# Patient Record
Sex: Male | Born: 1985 | Race: Black or African American | Hispanic: No | Marital: Single | Smoking: Former smoker
Health system: Southern US, Community
[De-identification: ages and names within clinical notes are randomized; demographics above are authoritative.]

## PROBLEM LIST (undated history)

## (undated) DIAGNOSIS — N189 Chronic kidney disease, unspecified: Secondary | ICD-10-CM

## (undated) DIAGNOSIS — I10 Essential (primary) hypertension: Secondary | ICD-10-CM

## (undated) DIAGNOSIS — L988 Other specified disorders of the skin and subcutaneous tissue: Secondary | ICD-10-CM

## (undated) DIAGNOSIS — J189 Pneumonia, unspecified organism: Secondary | ICD-10-CM

## (undated) DIAGNOSIS — K219 Gastro-esophageal reflux disease without esophagitis: Secondary | ICD-10-CM

## (undated) DIAGNOSIS — I420 Dilated cardiomyopathy: Secondary | ICD-10-CM

## (undated) DIAGNOSIS — F32A Depression, unspecified: Secondary | ICD-10-CM

## (undated) DIAGNOSIS — K611 Rectal abscess: Principal | ICD-10-CM

## (undated) DIAGNOSIS — M869 Osteomyelitis, unspecified: Secondary | ICD-10-CM

## (undated) DIAGNOSIS — M1990 Unspecified osteoarthritis, unspecified site: Secondary | ICD-10-CM

## (undated) DIAGNOSIS — F419 Anxiety disorder, unspecified: Secondary | ICD-10-CM

## (undated) DIAGNOSIS — K5010 Crohn's disease of large intestine without complications: Secondary | ICD-10-CM

## (undated) DIAGNOSIS — G709 Myoneural disorder, unspecified: Secondary | ICD-10-CM

## (undated) HISTORY — DX: Pneumonia, unspecified organism: J18.9

## (undated) HISTORY — DX: Dilated cardiomyopathy: I42.0

## (undated) HISTORY — DX: Other specified disorders of the skin and subcutaneous tissue: L98.8

## (undated) HISTORY — DX: Myoneural disorder, unspecified: G70.9

## (undated) HISTORY — DX: Depression, unspecified: F32.A

## (undated) HISTORY — DX: Rectal abscess: K61.1

## (undated) HISTORY — DX: Chronic kidney disease, unspecified: N18.9

## (undated) HISTORY — DX: Osteomyelitis, unspecified: M86.9

## (undated) HISTORY — DX: Unspecified osteoarthritis, unspecified site: M19.90

## (undated) HISTORY — DX: Anxiety disorder, unspecified: F41.9

## (undated) HISTORY — DX: Crohn's disease of large intestine without complications: K50.10

## (undated) HISTORY — DX: Gastro-esophageal reflux disease without esophagitis: K21.9

---

## 2004-08-23 HISTORY — PX: MANDIBLE FRACTURE SURGERY: SHX706

## 2012-11-19 ENCOUNTER — Encounter (HOSPITAL_COMMUNITY): Payer: Self-pay | Admitting: Emergency Medicine

## 2012-11-19 ENCOUNTER — Observation Stay (HOSPITAL_COMMUNITY)
Admission: EM | Admit: 2012-11-19 | Discharge: 2012-11-21 | Disposition: A | Discharge status: Home / Self Care | Payer: Self-pay | Attending: Cardiovascular Disease | Admitting: Cardiovascular Disease

## 2012-11-19 ENCOUNTER — Emergency Department (HOSPITAL_COMMUNITY): Payer: Self-pay

## 2012-11-19 DIAGNOSIS — R42 Dizziness and giddiness: Secondary | ICD-10-CM | POA: Insufficient documentation

## 2012-11-19 DIAGNOSIS — Z79899 Other long term (current) drug therapy: Secondary | ICD-10-CM | POA: Insufficient documentation

## 2012-11-19 DIAGNOSIS — I10 Essential (primary) hypertension: Secondary | ICD-10-CM | POA: Insufficient documentation

## 2012-11-19 DIAGNOSIS — R9431 Abnormal electrocardiogram [ECG] [EKG]: Secondary | ICD-10-CM

## 2012-11-19 DIAGNOSIS — R079 Chest pain, unspecified: Principal | ICD-10-CM | POA: Insufficient documentation

## 2012-11-19 DIAGNOSIS — E785 Hyperlipidemia, unspecified: Secondary | ICD-10-CM | POA: Insufficient documentation

## 2012-11-19 DIAGNOSIS — R209 Unspecified disturbances of skin sensation: Secondary | ICD-10-CM | POA: Insufficient documentation

## 2012-11-19 DIAGNOSIS — R110 Nausea: Secondary | ICD-10-CM | POA: Insufficient documentation

## 2012-11-19 DIAGNOSIS — I428 Other cardiomyopathies: Secondary | ICD-10-CM | POA: Insufficient documentation

## 2012-11-19 DIAGNOSIS — R61 Generalized hyperhidrosis: Secondary | ICD-10-CM | POA: Insufficient documentation

## 2012-11-19 DIAGNOSIS — F17200 Nicotine dependence, unspecified, uncomplicated: Secondary | ICD-10-CM | POA: Insufficient documentation

## 2012-11-19 HISTORY — DX: Essential (primary) hypertension: I10

## 2012-11-19 LAB — CBC
HCT: 37.9 % — ABNORMAL LOW (ref 39.0–52.0)
Hemoglobin: 13.2 g/dL (ref 13.0–17.0)
MCH: 28.6 pg (ref 26.0–34.0)
MCHC: 34.8 g/dL (ref 30.0–36.0)
MCV: 82.2 fL (ref 78.0–100.0)
Platelets: 242 10*3/uL (ref 150–400)
RBC: 4.61 MIL/uL (ref 4.22–5.81)
RDW: 14.2 % (ref 11.5–15.5)
WBC: 9.2 10*3/uL (ref 4.0–10.5)

## 2012-11-19 LAB — BASIC METABOLIC PANEL WITH GFR
BUN: 9 mg/dL (ref 6–23)
CO2: 24 meq/L (ref 19–32)
Calcium: 8.9 mg/dL (ref 8.4–10.5)
Chloride: 105 meq/L (ref 96–112)
Creatinine, Ser: 0.94 mg/dL (ref 0.50–1.35)
GFR calc Af Amer: >90 mL/min
GFR calc non Af Amer: >90 mL/min
Glucose, Bld: 102 mg/dL — ABNORMAL HIGH (ref 70–99)
Potassium: 3 meq/L — ABNORMAL LOW (ref 3.5–5.1)
Sodium: 141 meq/L (ref 135–145)

## 2012-11-19 LAB — POCT I-STAT TROPONIN I: Troponin i, poc: 0 ng/mL (ref 0.00–0.08)

## 2012-11-19 IMAGING — CR DG CHEST 1V PORT
1 series · 1 of 1 positions shown · non-contrast
Comparison: None.

CLINICAL DATA: Chest pain

PORTABLE CHEST - 1 VIEW

[AP]
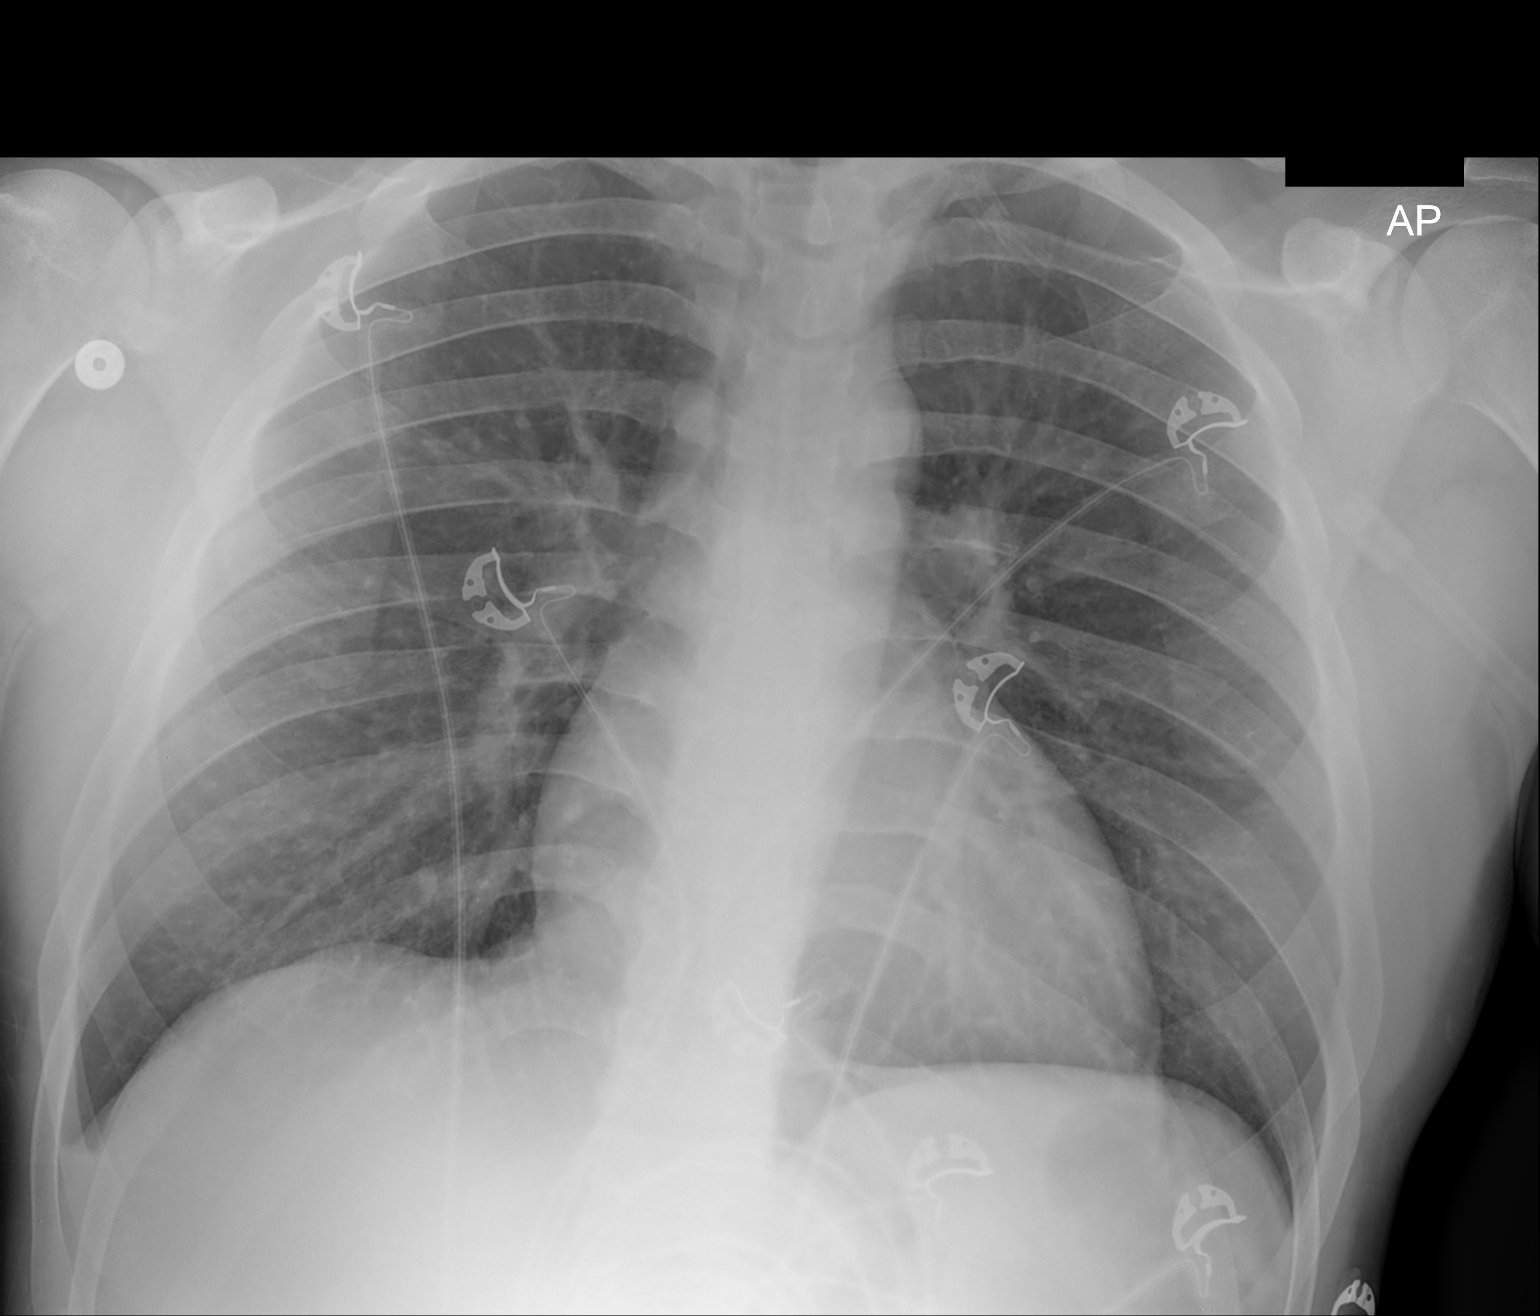

[1 of 1 positions shown; findings below may reference images not displayed]

FINDINGS: Normal heart size.  Clear lungs.  No pneumothorax.  No
acute bony deformity.  No definite pleural effusion.
IMPRESSION: No active cardiopulmonary disease.

## 2012-11-19 MED ORDER — ASPIRIN 81 MG PO CHEW
CHEWABLE_TABLET | ORAL | Status: AC
  Administered 2012-11-19: 324 mg via ORAL
  Filled 2012-11-19: qty 4

## 2012-11-19 MED ORDER — ASPIRIN 325 MG PO TABS: 325.0000 mg | ORAL_TABLET | ORAL | Status: DC

## 2012-11-19 MED ORDER — NITROGLYCERIN 0.4 MG SL SUBL
0.4000 mg | SUBLINGUAL_TABLET | SUBLINGUAL | Status: DC | PRN
  Administered 2012-11-19 (×2): 0.4 mg via SUBLINGUAL
  Filled 2012-11-19: qty 25

## 2012-11-19 MED ORDER — ASPIRIN 81 MG PO CHEW: 324.0000 mg | CHEWABLE_TABLET | Freq: Once | ORAL | Status: AC

## 2012-11-19 NOTE — ED Notes (Signed)
Pt c/o central chest pain non radiating onset 1900 tonight, describes at burning. +SHOB, +nausea +dizziness + diaphoresis. Pt also c/o L arm tingling/numbness.

## 2012-11-19 NOTE — ED Notes (Deleted)
Report called to [facility], pt to go [phone]

## 2012-11-20 ENCOUNTER — Observation Stay (HOSPITAL_COMMUNITY): Payer: Self-pay

## 2012-11-20 DIAGNOSIS — R079 Chest pain, unspecified: Secondary | ICD-10-CM | POA: Diagnosis present

## 2012-11-20 DIAGNOSIS — I10 Essential (primary) hypertension: Secondary | ICD-10-CM | POA: Diagnosis present

## 2012-11-20 LAB — BASIC METABOLIC PANEL
BUN: 12 mg/dL (ref 6–23)
CO2: 25 mEq/L (ref 19–32)
CO2: 24 mEq/L (ref 19–32)
Chloride: 106 mEq/L (ref 96–112)
Chloride: 105 mEq/L (ref 96–112)
Creatinine, Ser: 0.91 mg/dL (ref 0.50–1.35)
Creatinine, Ser: 1.02 mg/dL (ref 0.50–1.35)
GFR calc Af Amer: >90 mL/min (ref >90)
Glucose, Bld: 88 mg/dL (ref 70–99)
Glucose, Bld: 90 mg/dL (ref 70–99)
Potassium: 4 mEq/L (ref 3.5–5.1)

## 2012-11-20 LAB — CBC
HCT: 39.2 % (ref 39.0–52.0)
Hemoglobin: 13.6 g/dL (ref 13.0–17.0)
MCH: 28.9 pg (ref 26.0–34.0)
MCV: 83 fL (ref 78.0–100.0)
MCV: 83.2 fL (ref 78.0–100.0)
Platelets: 250 10*3/uL (ref 150–400)
Platelets: 263 10*3/uL (ref 150–400)
RBC: 4.4 MIL/uL (ref 4.22–5.81)
RBC: 4.71 MIL/uL (ref 4.22–5.81)
WBC: 8.9 10*3/uL (ref 4.0–10.5)
WBC: 8.6 10*3/uL (ref 4.0–10.5)

## 2012-11-20 LAB — PROTIME-INR: Prothrombin Time: 15.1 seconds (ref 11.6–15.2)

## 2012-11-20 LAB — LIPID PANEL
Cholesterol: 88 mg/dL (ref 0–200)
LDL Cholesterol: 54 mg/dL (ref 0–99)
Triglycerides: 31 mg/dL (ref <150)

## 2012-11-20 LAB — TROPONIN I
Troponin I: <0.3 ng/mL (ref <0.30)
Troponin I: <0.3 ng/mL (ref <0.30)
Troponin I: <0.3 ng/mL (ref <0.30)

## 2012-11-20 MED ORDER — ASPIRIN 81 MG PO CHEW
324.0000 mg | CHEWABLE_TABLET | ORAL | Status: AC
  Filled 2012-11-20: qty 4

## 2012-11-20 MED ORDER — NITROGLYCERIN IN D5W 200-5 MCG/ML-% IV SOLN
5.0000 ug/min | INTRAVENOUS | Status: DC
  Administered 2012-11-20: 5 ug/min via INTRAVENOUS
  Filled 2012-11-20: qty 250

## 2012-11-20 MED ORDER — SODIUM CHLORIDE 0.9 % IJ SOLN: 3.0000 mL | INTRAMUSCULAR | Status: DC | PRN

## 2012-11-20 MED ORDER — ONDANSETRON HCL 4 MG/2ML IJ SOLN: 4.0000 mg | Freq: Four times a day (QID) | INTRAMUSCULAR | Status: DC | PRN

## 2012-11-20 MED ORDER — ALPRAZOLAM 0.25 MG PO TABS
0.2500 mg | ORAL_TABLET | Freq: Two times a day (BID) | ORAL | Status: DC | PRN
  Administered 2012-11-20: 0.25 mg via ORAL
  Filled 2012-11-20: qty 1

## 2012-11-20 MED ORDER — SODIUM CHLORIDE 0.9 % IJ SOLN
3.0000 mL | Freq: Two times a day (BID) | INTRAMUSCULAR | Status: DC
  Administered 2012-11-20: 3 mL via INTRAVENOUS

## 2012-11-20 MED ORDER — POTASSIUM CHLORIDE CRYS ER 20 MEQ PO TBCR
40.0000 meq | EXTENDED_RELEASE_TABLET | Freq: Once | ORAL | Status: AC
  Administered 2012-11-20: 40 meq via ORAL
  Filled 2012-11-20: qty 2

## 2012-11-20 MED ORDER — REGADENOSON 0.4 MG/5ML IV SOLN
0.4000 mg | Freq: Once | INTRAVENOUS | Status: AC
  Administered 2012-11-20: 0.4 mg via INTRAVENOUS
  Filled 2012-11-20: qty 5

## 2012-11-20 MED ORDER — ASPIRIN 300 MG RE SUPP
300.0000 mg | RECTAL | Status: AC
  Filled 2012-11-20: qty 1

## 2012-11-20 MED ORDER — SODIUM CHLORIDE 0.9 % IV SOLN: 250.0000 mL | INTRAVENOUS | Status: DC | PRN

## 2012-11-20 MED ORDER — HEPARIN BOLUS VIA INFUSION
3000.0000 [IU] | Freq: Once | INTRAVENOUS | Status: AC
  Administered 2012-11-20: 3000 [IU] via INTRAVENOUS
  Filled 2012-11-20 (×2): qty 3000

## 2012-11-20 MED ORDER — NITROGLYCERIN 0.4 MG SL SUBL
0.4000 mg | SUBLINGUAL_TABLET | SUBLINGUAL | Status: DC | PRN
  Filled 2012-11-20: qty 25

## 2012-11-20 MED ORDER — HEPARIN BOLUS VIA INFUSION
4000.0000 [IU] | Freq: Once | INTRAVENOUS | Status: AC
  Administered 2012-11-20: 4000 [IU] via INTRAVENOUS
  Filled 2012-11-20: qty 4000

## 2012-11-20 MED ORDER — ACETAMINOPHEN 325 MG PO TABS: 650.0000 mg | ORAL_TABLET | ORAL | Status: DC | PRN

## 2012-11-20 MED ORDER — ATORVASTATIN CALCIUM 40 MG PO TABS
40.0000 mg | ORAL_TABLET | Freq: Every day | ORAL | Status: DC
  Administered 2012-11-20 – 2012-11-21 (×2): 40 mg via ORAL
  Filled 2012-11-20 (×2): qty 1

## 2012-11-20 MED ORDER — ASPIRIN EC 81 MG PO TBEC
81.0000 mg | DELAYED_RELEASE_TABLET | Freq: Every day | ORAL | Status: DC
  Filled 2012-11-20: qty 1

## 2012-11-20 MED ORDER — DIAZEPAM 5 MG PO TABS
5.0000 mg | ORAL_TABLET | ORAL | Status: AC
  Administered 2012-11-21: 5 mg via ORAL

## 2012-11-20 MED ORDER — HEPARIN (PORCINE) IN NACL 100-0.45 UNIT/ML-% IJ SOLN
1300.0000 [IU]/h | INTRAMUSCULAR | Status: DC
  Administered 2012-11-20: 1100 [IU]/h via INTRAVENOUS
  Administered 2012-11-20: 1300 [IU]/h via INTRAVENOUS
  Filled 2012-11-20 (×3): qty 250

## 2012-11-20 MED ORDER — METOPROLOL TARTRATE 25 MG PO TABS
25.0000 mg | ORAL_TABLET | Freq: Two times a day (BID) | ORAL | Status: DC
  Administered 2012-11-20 (×2): 25 mg via ORAL
  Filled 2012-11-20 (×5): qty 1

## 2012-11-20 MED ORDER — SODIUM CHLORIDE 0.9 % IV SOLN
INTRAVENOUS | Status: DC
  Administered 2012-11-20: 22:00:00 via INTRAVENOUS

## 2012-11-20 NOTE — ED Provider Notes (Signed)
History     CSN: [id number]  Arrival date & time 11/19/12  2243   First MD Initiated Contact with Patient 11/19/12 2328      Chief Complaint  Patient presents with  . Chest Pain    (Consider location/radiation/quality/duration/timing/severity/associated sxs/prior treatment) HPI 27 year old male presents to emergency room with complaint of chest pain, left arm pain and tingling starting at 7 PM.  Patient has shortness of breath, diaphoresis, and nausea associated with the symptoms.  Patient has past history of hypertension, is a pack-a-day smoker.  He reports grandfather with heart disease.  Patient ran out of his metoprolol approximately a month ago.  No prior history of same.  Patient is adamant that he has not used cocaine.  He received aspirin and 2 nitroglycerin prior to my evaluation.  He reports complete resolution of his symptoms.  Blood pressure still slightly elevated.  Past Medical History  Diagnosis Date  . Hypertension     History reviewed. No pertinent past surgical history.  No family history on file.  History  Substance Use Topics  . Smoking status: Current Every Day Smoker  . Smokeless tobacco: Not on file  . Alcohol Use: Yes     Comment: occ      Review of Systems  All other systems reviewed and are negative.    Allergies  Review of patient's allergies indicates no known allergies.  Home Medications  No current outpatient prescriptions on file.  BP 128/100  Pulse 69  Temp(Src) 98.7 F (37.1 C) (Oral)  Resp 16  Wt 205 lb (92.987 kg)  SpO2 99%  Physical Exam  Nursing note and vitals reviewed. Constitutional: He is oriented to person, place, and time. He appears well-developed and well-nourished.  HENT:  Head: Normocephalic and atraumatic.  Right Ear: External ear normal.  Left Ear: External ear normal.  Nose: Nose normal.  Mouth/Throat: Oropharynx is clear and moist.  Eyes: Conjunctivae and EOM are normal. Pupils are equal, round, and  reactive to light.  Neck: Normal range of motion. Neck supple. No JVD present. No tracheal deviation present. No thyromegaly present.  Cardiovascular: Normal rate, regular rhythm, normal heart sounds and intact distal pulses.  Exam reveals no gallop and no friction rub.   No murmur heard. Pulmonary/Chest: Effort normal and breath sounds normal. No stridor. No respiratory distress. He has no wheezes. He has no rales. He exhibits no tenderness.  Abdominal: Soft. Bowel sounds are normal. He exhibits no distension and no mass. There is no tenderness. There is no rebound and no guarding.  Musculoskeletal: Normal range of motion. He exhibits no edema and no tenderness.  Lymphadenopathy:    He has no cervical adenopathy.  Neurological: He is oriented to person, place, and time. He has normal reflexes. No cranial nerve deficit. He exhibits normal muscle tone. Coordination normal.  Skin: Skin is dry. No rash noted. No erythema. No pallor.  Psychiatric: He has a normal mood and affect. His behavior is normal. Judgment and thought content normal.    ED Course  Procedures (including critical care time)   Labs Reviewed  CBC - Abnormal; Notable for the following:    HCT 37.9 (*)    All other components within normal limits  BASIC METABOLIC PANEL - Abnormal; Notable for the following:    Potassium 3.0 (*)    Glucose, Bld 102 (*)    All other components within normal limits  PRO B NATRIURETIC PEPTIDE  POCT I-STAT TROPONIN I   Dg Chest [facility]  1 View  11/19/2012  *RADIOLOGY REPORT*  Clinical Data: Chest pain  PORTABLE CHEST - 1 VIEW  Comparison: None.  Findings: Normal heart size.  Clear lungs.  No pneumothorax.  No acute bony deformity.  No definite pleural effusion.  IMPRESSION: No active cardiopulmonary disease.   Original Report Authenticated By: Jolaine Click, M.D.     Date: 11/20/2012  Rate: 81  Rhythm: normal sinus rhythm  QRS Axis: normal  Intervals: normal  ST/T Wave abnormalities: t wave  inversions III, V5, V6  Conduction Disutrbances:none  Narrative Interpretation:   Old EKG Reviewed: none available    1. Chest pain   2. Hypertension   3. Abnormal EKG       MDM  27 year old male with significant hypertension, and chest pain, with diaphoresis, shortness of breath, and radiation into his left arm.  History is concerning for ACS.  He has inferior and lateral T-wave inversions, no prior history. will discuss with cardiology.  Initially ordered nitroglycerin drip, given that he had complete resolution sublingual ntg but since that time has had normalization of his blood pressure.        Olivia Mackie, MD 11/20/12 0600

## 2012-11-20 NOTE — Progress Notes (Signed)
Patient on nitro drip at 1.70ml/h for chest pain.  Patient to go to [facility] for stress test.  Patient has not had chest pain since 4am this morning; blood pressure and HR stable.  Nitro drip d/ced per Dr. Algie Coffer.  Patient is still on heparin drip at 11 ml/h.

## 2012-11-20 NOTE — Progress Notes (Signed)
Subjective:  No chest pain or reversible ischemia but dilated LV with mild to moderate LV systolic dysfunction and EF 44 %.  Objective:  Vital Signs in the last 24 hours: Temp:  [97.9 F (36.6 C)-98.7 F (37.1 C)] 98.6 F (37 C) (03/31 1535) Pulse Rate:  [54-88] 61 (03/31 1535) Cardiac Rhythm:  [-] Normal sinus rhythm (03/31 0809) Resp:  [12-20] 12 (03/31 1535) BP: (128-185)/(72-105) 154/96 mmHg (03/31 1535) SpO2:  [99 %-100 %] 100 % (03/31 1535) Weight:  [86 kg (189 lb 9.5 oz)-92.987 kg (205 lb)] 86 kg (189 lb 9.5 oz) (03/31 0337)  Physical Exam: BP Readings from Last 1 Encounters:  11/20/12 154/96     Wt Readings from Last 1 Encounters:  11/20/12 86 kg (189 lb 9.5 oz)    Weight change:   HEENT: [place]/AT, Eyes-Brown, PERL, EOMI, Conjunctiva-Pink, Sclera-Non-icteric Neck: No JVD, No bruit, Trachea midline. Lungs:  Clear, Bilateral. Cardiac:  Regular rhythm, normal S1 and S2, no S3. II/VI systolic murmur. Abdomen:  Soft, non-tender. Extremities:  No edema present. No cyanosis. No clubbing. CNS: AxOx3, Cranial nerves grossly intact, moves all 4 extremities. Right handed. Skin: Warm and dry.   Intake/Output from previous day: 03/30 0701 - 03/31 0700 In: 26.3 [I.V.:26.3] Out: 325 [Urine:325]    Lab Results: BMET    Component Value Date/Time   NA 142 11/20/2012 0439   K 3.8 11/20/2012 0439   CL 106 11/20/2012 0439   CO2 25 11/20/2012 0439   GLUCOSE 88 11/20/2012 0439   BUN 9 11/20/2012 0439   CREATININE 0.91 11/20/2012 0439   CALCIUM 9.1 11/20/2012 0439   GFRNONAA >90 11/20/2012 0439   GFRAA >90 11/20/2012 0439   CBC    Component Value Date/Time   WBC 8.9 11/20/2012 0439   RBC 4.40 11/20/2012 0439   HGB 12.5* 11/20/2012 0439   HCT 36.5* 11/20/2012 0439   PLT 250 11/20/2012 0439   MCV 83.0 11/20/2012 0439   MCH 28.4 11/20/2012 0439   MCHC 34.2 11/20/2012 0439   RDW 14.2 11/20/2012 0439   CARDIAC ENZYMES Lab Results  Component Value Date   TROPONINI <0.30 11/20/2012     Scheduled Meds: . aspirin  324 mg Oral NOW   Or  . aspirin  300 mg Rectal NOW  . [START ON 11/21/2012] aspirin EC  81 mg Oral Daily  . atorvastatin  40 mg Oral q1800  . metoprolol tartrate  25 mg Oral BID  . sodium chloride  3 mL Intravenous Q12H   Continuous Infusions: . heparin 1,100 Units/hr (11/20/12 0354)   PRN Meds:.sodium chloride, acetaminophen, ALPRAZolam, nitroGLYCERIN, ondansetron (ZOFRAN) IV, sodium chloride  Assessment/Plan: Dilated cardiomyopathy HTN  Right and left heart cath in AM.    LOS: 1 day    Dixie Dials  MD  11/20/2012, 6:56 PM

## 2012-11-20 NOTE — Progress Notes (Signed)
PHARMACY BRIEF NOTE - Drug Level Result  Consult for:  IV Heparin Indication:  ACS  With the infusion running at 1100 units/hr, the Heparin level at 18:00 is reported as 0.18 units/ml.  This level is below the therapeutic range, 0.3-0.7 units/ml.  Plan:  Increase the rate to 1300 units/hr with 3000 unit bolus.  Next level with am labs on 4/1.  Polo Riley R.Ph. 11/20/2012 8:26 PM

## 2012-11-20 NOTE — Progress Notes (Signed)
ANTICOAGULATION CONSULT NOTE - Initial Consult  Pharmacy Consult for Heparin Indication: chest pain/ACS  No Known Allergies  Patient Measurements: Height: 5\' 11"  (180.3 cm) Weight: 189 lb 9.5 oz (86 kg) IBW/kg (Calculated) : 75.3 Heparin Dosing Weight:   Vital Signs: Temp: 97.9 F (36.6 C) (03/31 0337) Temp src: Oral (03/31 0337) BP: 147/95 mmHg (03/31 0420) Pulse Rate: 60 (03/31 0420)  Labs:  Recent Labs  11/19/12 2327  HGB 13.2  HCT 37.9*  PLT 242  CREATININE 0.94    Estimated Creatinine Clearance: 124.6 ml/min (by C-G formula based on Cr of 0.94).   Medical History: Past Medical History  Diagnosis Date  . Hypertension     Medications:  No prescriptions prior to admission   Infusions:  . heparin 1,100 Units/hr (11/20/12 0354)  . nitroGLYCERIN 5 mcg/min (11/20/12 0334)    Assessment: Patient with chest pain.  Heparin ordered for ACS.  Goal of Therapy:  Heparin level 0.3-0.7 units/ml Monitor platelets by anticoagulation protocol: Yes   Plan:  Heparin drip at 1100 units/hr after 4000 units bolus.   Heparin level at 1000, Heparin level and CBC daily.   Darlina Guys, Jacquenette Shone Crowford 11/20/2012,4:52 AM

## 2012-11-20 NOTE — H&P (Signed)
Shawn [name] is an 27 y.o. male.   Chief Complaint: Chest pain HPI: 27 years old male with known hypertension and tobacco use disorder has central chest pain with nausea, dizziness and diaphoresis. + left arm tingling and numbness.   Past Medical History  Diagnosis Date  . Hypertension       History reviewed. No pertinent past surgical history.  No family history on file. Social History:  reports that he has been smoking.  He does not have any smokeless tobacco history on file. He reports that  drinks alcohol. He reports that he does not use illicit drugs.  Allergies: No Known Allergies   (Not in a hospital admission)  Results for orders placed during the hospital encounter of 11/19/12 (from the past 48 hour(s))  CBC     Status: Abnormal   Collection Time    11/19/12 11:27 PM      Result Value Range   WBC 9.2  4.0 - 10.5 K/uL   RBC 4.61  4.22 - 5.81 MIL/uL   Hemoglobin 13.2  13.0 - 17.0 g/dL   HCT 37.9 (*) 39.0 - 52.0 %   MCV 82.2  78.0 - 100.0 fL   MCH 28.6  26.0 - 34.0 pg   MCHC 34.8  30.0 - 36.0 g/dL   RDW 14.2  11.5 - 15.5 %   Platelets 242  150 - 400 K/uL  BASIC METABOLIC PANEL     Status: Abnormal   Collection Time    11/19/12 11:27 PM      Result Value Range   Sodium 141  135 - 145 mEq/L   Potassium 3.0 (*) 3.5 - 5.1 mEq/L   Chloride 105  96 - 112 mEq/L   CO2 24  19 - 32 mEq/L   Glucose, Bld 102 (*) 70 - 99 mg/dL   BUN 9  6 - 23 mg/dL   Creatinine, Ser 0.94  0.50 - 1.35 mg/dL   Calcium 8.9  8.4 - 10.5 mg/dL   GFR calc non Af Amer >90  >90 mL/min   GFR calc Af Amer >90  >90 mL/min   Comment:            The eGFR has been calculated     using the CKD EPI equation.     This calculation has not been     validated in all clinical     situations.     eGFR's persistently     <90 mL/min signify     possible Chronic Kidney Disease.  PRO B NATRIURETIC PEPTIDE     Status: None   Collection Time    11/19/12 11:27 PM      Result Value Range   Pro B Natriuretic  peptide (BNP) 21.2  0 - 125 pg/mL  POCT I-STAT TROPONIN I     Status: None   Collection Time    11/19/12 11:39 PM      Result Value Range   Troponin i, poc 0.00  0.00 - 0.08 ng/mL   Comment 3            Comment: Due to the release kinetics of cTnI,     a negative result within the first hours     of the onset of symptoms does not rule out     myocardial infarction with certainty.     If myocardial infarction is still suspected,     repeat the test at appropriate intervals.   Dg Chest [facility] 1 [address]  11/19/2012  *RADIOLOGY REPORT*  Clinical Data: Chest pain  PORTABLE CHEST - 1 VIEW  Comparison: None.  Findings: Normal heart size.  Clear lungs.  No pneumothorax.  No acute bony deformity.  No definite pleural effusion.  IMPRESSION: No active cardiopulmonary disease.   Original Report Authenticated By: Marybelle Killings, M.D.     @ROS @ No weight gain or loss.  No Fever-chills,  No Headache, No changes with Vision. No loss of hearing,  No problems swallowing food or Liquids,  As above Chest pain.  No Abdominal pain, No Nausea or Vommitting, Bowel movements are regular,  No Blood in stool or Urine,  No dysuria,  No new skin rashes or bruises,  No new joints pains-aches,  No new weakness, tingling, numbness in any extremity,  No recent weight gain or loss,  No polyuria, polydypsia or polyphagia,  No significant Mental Stressors.  Blood pressure 128/100, pulse 69, temperature 98.7 F (37.1 C), temperature source Oral, resp. rate 16, weight 92.987 kg (205 lb), SpO2 99.00%. HENT: Head: Normocephalic and atraumatic. Eyes: [place], Conjunctivae are normal. No scleral icterus.  Neck: supple. FROM  Cardiovascular: Normal rate, regular rhythm and normal heart sounds. Exam reveals no gallop and no friction rub. II/VI systolic murmur heard.  Pulmonary/Chest: Clear breath sounds, bilaterally.  Abdominal: Soft. Bowel sounds are normal. There is no tenderness.  Neurological: He is alert and oriented to  person, place, and time. Moves all 4 extremities.  Skin: Skin is warm and dry.     Assessment/Plan Chest pain R/O MI Hypertension  Nuclear stress test today.  Ruqayya Ventress S 11/20/2012, 2:27 AM

## 2012-11-20 NOTE — Progress Notes (Signed)
ANTICOAGULATION CONSULT NOTE - Follow Up  Pharmacy Consult for Heparin Indication: Chest Pain/ACS  No Known Allergies  Patient Measurements: Height: 5\' 11"  (180.3 cm) Weight: 189 lb 9.5 oz (86 kg) IBW/kg (Calculated) : 75.3  Vital Signs: Temp: 97.9 F (36.6 C) (03/31 0337) Temp src: Oral (03/31 0337) BP: 146/88 mmHg (03/31 0630) Pulse Rate: 55 (03/31 1000)  Labs:  Recent Labs  11/19/12 2327 11/20/12 0439 11/20/12 0935  HGB 13.2 12.5*  --   HCT 37.9* 36.5*  --   PLT 242 250  --   LABPROT  --  15.1  --   INR  --  1.21  --   HEPARINUNFRC  --   --  0.31  CREATININE 0.94 0.91  --   TROPONINI  --  <0.30 <0.30    Estimated Creatinine Clearance: 128.7 ml/min (by C-G formula based on Cr of 0.91).   Medications:  Scheduled:  . [COMPLETED] aspirin  324 mg Oral Once  . aspirin  324 mg Oral NOW   Or  . aspirin  300 mg Rectal NOW  . [START ON 11/21/2012] aspirin EC  81 mg Oral Daily  . atorvastatin  40 mg Oral q1800  . [COMPLETED] heparin  4,000 Units Intravenous Once  . metoprolol tartrate  25 mg Oral BID  . [COMPLETED] potassium chloride  40 mEq Oral Once  . [COMPLETED] potassium chloride  40 mEq Oral Once  . sodium chloride  3 mL Intravenous Q12H  . [DISCONTINUED] aspirin  325 mg Oral STAT   Infusions:  . heparin 1,100 Units/hr (11/20/12 0354)  . [DISCONTINUED] nitroGLYCERIN 5 mcg/min (11/20/12 0334)    Assessment: 27 yo M admitted to [facility] on 11/20/12 with chest pain and r/o MI. IV heparin was started at 4am today. 1st heparin level is therapeutic (0.31) but on low side of goal range. Patient has physically left the building (to [facility] for stress test), therefore plan will be to continue heparin at 1100 units/hr and recheck a heparin level in 6 hours (assuming IV heparin wasn't stopped). No bleeding reported in chart notes. Plts wnl.  Goal of Therapy:  Heparin level 0.3-0.7 units/ml Monitor platelets by anticoagulation protocol: Yes   Plan:  1) Continue  heparin at 1100 units/hr (11 ml/hr) 2) F/U results of stress test and if further anticoag needed 3) Will order repeat heparin level to confirm therapeutic dose.  Darrol Angel, PharmD Pager: [phone] 11/20/2012,11:19 AM

## 2012-11-20 NOTE — Care Management Note (Addendum)
    Page 1 of 1   11/21/2012     2:13:50 PM   CARE MANAGEMENT NOTE 11/21/2012  Patient:  Shawn [name], Shawn [name]   Account Number:  [id number]  Date Initiated:  11/20/2012  Documentation initiated by:  Colleen Can  Subjective/Objective Assessment:   DX CHEST PAIN     Action/Plan:   PT GOING TO [facility] FOR NUCLEAR STRES TEST   Anticipated DC Date:  11/21/2012   Anticipated DC Plan:  ACUTE TO ACUTE TRANS      DC Planning Services  CM consult      Choice offered to / List presented to:             Status of service:  Completed, signed off Medicare Important Message given?   (If response is "NO", the following Medicare IM given date fields will be blank) Date Medicare IM given:   Date Additional Medicare IM given:    Discharge Disposition:  ACUTE TO ACUTE TRANS  Per UR Regulation:  Reviewed for med. necessity/level of care/duration of stay  If discussed at Long Length of Stay Meetings, dates discussed:    Comments:  11/21/12 Niya Behler RN,BSN NCM [phone] TRANSFERRED TO [facility]-CARDIAC CATH VIA [facility].

## 2012-11-21 ENCOUNTER — Encounter (HOSPITAL_COMMUNITY)
Admission: EM | Disposition: A | Discharge status: Home / Self Care | Payer: Self-pay | Source: Home / Self Care | Attending: Emergency Medicine

## 2012-11-21 HISTORY — PX: LEFT AND RIGHT HEART CATHETERIZATION WITH CORONARY ANGIOGRAM: SHX5449

## 2012-11-21 LAB — POCT I-STAT 3, ART BLOOD GAS (G3+)
Acid-base deficit: 2 mmol/L (ref 0.0–2.0)
Bicarbonate: 23.9 mEq/L (ref 20.0–24.0)
pH, Arterial: 7.355 (ref 7.350–7.450)

## 2012-11-21 LAB — CBC
HCT: 37.4 % — ABNORMAL LOW (ref 39.0–52.0)
MCH: 28.2 pg (ref 26.0–34.0)
MCV: 82.9 fL (ref 78.0–100.0)
RBC: 4.51 MIL/uL (ref 4.22–5.81)
WBC: 8.3 10*3/uL (ref 4.0–10.5)

## 2012-11-21 LAB — POCT I-STAT 3, VENOUS BLOOD GAS (G3P V)
Acid-base deficit: 3 mmol/L — ABNORMAL HIGH (ref 0.0–2.0)
O2 Saturation: 66 %
TCO2: 24 mmol/L (ref 0–100)
pCO2, Ven: 43.7 mmHg — ABNORMAL LOW (ref 45.0–50.0)

## 2012-11-21 LAB — POCT ACTIVATED CLOTTING TIME: Activated Clotting Time: 149 seconds

## 2012-11-21 SURGERY — LEFT AND RIGHT HEART CATHETERIZATION WITH CORONARY ANGIOGRAM
Anesthesia: LOCAL

## 2012-11-21 SURGERY — LEFT AND RIGHT HEART CATHETERIZATION WITH CORONARY ANGIOGRAM
Anesthesia: Moderate Sedation

## 2012-11-21 MED ORDER — MIDAZOLAM HCL 2 MG/2ML IJ SOLN
INTRAMUSCULAR | Status: AC
  Filled 2012-11-21: qty 2

## 2012-11-21 MED ORDER — LIDOCAINE HCL (PF) 1 % IJ SOLN
INTRAMUSCULAR | Status: AC
  Filled 2012-11-21: qty 30

## 2012-11-21 MED ORDER — FENTANYL CITRATE 0.05 MG/ML IJ SOLN
INTRAMUSCULAR | Status: AC
  Filled 2012-11-21: qty 2

## 2012-11-21 MED ORDER — HYDRALAZINE HCL 20 MG/ML IJ SOLN
INTRAMUSCULAR | Status: AC
  Filled 2012-11-21: qty 1

## 2012-11-21 MED ORDER — LISINOPRIL 10 MG PO TABS: 10.0000 mg | ORAL_TABLET | Freq: Every day | ORAL | Status: DC

## 2012-11-21 MED ORDER — DIAZEPAM 5 MG PO TABS
ORAL_TABLET | ORAL | Status: AC
  Filled 2012-11-21: qty 1

## 2012-11-21 MED ORDER — HEPARIN (PORCINE) IN NACL 2-0.9 UNIT/ML-% IJ SOLN
INTRAMUSCULAR | Status: AC
  Filled 2012-11-21: qty 1000

## 2012-11-21 MED ORDER — HYDRALAZINE HCL 20 MG/ML IJ SOLN
10.0000 mg | Freq: Once | INTRAMUSCULAR | Status: AC
  Administered 2012-11-21: 10 mg via INTRAVENOUS

## 2012-11-21 MED ORDER — METOPROLOL TARTRATE 25 MG PO TABS: 25.0000 mg | ORAL_TABLET | Freq: Two times a day (BID) | ORAL | Status: DC

## 2012-11-21 MED ORDER — SODIUM CHLORIDE 0.9 % IV SOLN
INTRAVENOUS | Status: AC
  Administered 2012-11-21: 11:00:00 via INTRAVENOUS

## 2012-11-21 NOTE — Discharge Summary (Signed)
Physician Discharge Summary  Patient ID: Shawn [name] MRN: [id number] DOB/AGE: 1985-09-26 27 y.o.  Admit date: 11/19/2012 Discharge date: 11/21/2012  Admission Diagnoses: Chest pain at rest Hypertension  Discharge Diagnoses:  Principal Problem:   Chest pain at rest Active Problems:   Hypertension   Dilated cardiomyopathy    Tobacco use disorder   Dyslipidemia  Discharged Condition: good  Hospital Course: 27 years old male with known hypertension and tobacco use disorder had central chest pain with nausea, dizziness and diaphoresis along with left arm tingling and numbness. His nuclear stress test revealed dilated LV with moderate LV systolic dysfunction. He underwent right and left heart catheterization that showed hypertensive heart disease with mild LV systolic dysfunction and normal coronaries. Ace inhibitor and B-blocker medications were added andlow salt diet, activity to improve low HDL cholesterol and medication compliance were discussed. He was strongly advised to decrease smoking over next 1-2 weeks and go for smoking cessation class.   Consults: None  Significant Diagnostic Studies: labs: Normal blood test post K+ supplementation. Low HDL of 28.  nuclear medicine: Dilated LV with EF of 44 %.  Angiography: Normal right heart pressures, normal coronaries and mild systolic dysfunction with EF 50 %.  Treatments: cardiac meds: lisinopril (Prinivil) and metoprolol  Discharge Exam: Blood pressure 142/84, pulse 84, temperature 98.2 F (36.8 C), temperature source Oral, resp. rate 16, height 5' 11"  (1.803 m), weight 86 kg (189 lb 9.5 oz), SpO2 100.00%. HEENT: [place]/AT, Eyes-Brown, PERL, EOMI, Conjunctiva-Pink, Sclera-Non-icteric  Neck: No JVD, No bruit, Trachea midline.  Lungs: Clear, Bilateral.  Cardiac: Regular rhythm, normal S1 and S2, no S3. II/VI systolic murmur.  Abdomen: Soft, non-tender.  Extremities: No edema present. No cyanosis. No clubbing.  CNS: AxOx3, Cranial  nerves grossly intact, moves all 4 extremities. Right handed.  Skin: Warm and dry.   Disposition: 01, Home, self care.     Medication List    TAKE these medications       lisinopril 10 MG tablet  Commonly known as:  ZESTRIL  Take 1 tablet (10 mg total) by mouth daily.     metoprolol tartrate 25 MG tablet  Commonly known as:  LOPRESSOR  Take 1 tablet (25 mg total) by mouth 2 (two) times daily.           Follow-up Information   Follow up with [facility] S, MD. Schedule an appointment as soon as possible for a visit in 1 month.   Contact information:   [place] [id number] [phone]       Signed: Birdie Riddle 11/21/2012, 6:14 PM

## 2012-11-21 NOTE — CV Procedure (Signed)
PROCEDURE:  Left heart catheterization with selective coronary angiography, left ventriculogram.  CLINICAL HISTORY:  This is a 27 year old male with chest pain, hypertension, tobacco use disorder and LV systolic dysfunction.  The risks, benefits, and details of the procedure were explained to the patient.  The patient verbalized understanding and wanted to proceed.  Informed written consent was obtained.  PROCEDURE TECHNIQUE:  The patient was approached from the right femoral artery using a 5 French short sheath and right femoral vein using 7 Fr. sheath.  Left coronary angiography was done using a Judkins L4 guide catheter.  Right coronary angiography was done using a Judkins R4 guide catheter.  Left ventriculography was done using a pigtail catheter. Right heart catheterization and cardiac output were done using Swan Ganz catheter placed under fluoroscopic guidance. Oxygen saturations were measured on blood from PA and AO.   CONTRAST:  Total of 50 cc.  COMPLICATIONS:  None.  At the end of the procedure a manual device was used for hemostasis.    HEMODYNAMICS:  Aortic pressure was 132/92; LV pressure was 140/6; LVEDP 13.  There was no gradient between the left ventricle and aorta.  PA 24/10, PW-13,  RV-29/7, RA-13/12. CO-5.87-Thermal and 5.29 by Fick.  ANGIOGRAM/CORONARY ARTERIOGRAM:   The left main coronary artery is unremarkable.  The left anterior descending artery is unremarkable. Diagonal is unremarkable  The left circumflex artery is unremarkable with large Ramus branch.  The right coronary artery is dominant and unremarkable. PDA and postero-lateral branches are unremarkable.  LEFT VENTRICULOGRAM:  Left ventricular angiogram was done in the 30 RAO projection and revealed mild generalized hypokinesia of left ventricle with an estimated ejection fraction of 50%.  LVEDP was 13 mmHg.  IMPRESSION OF HEART CATHETERIZATION:   1. Normal left main coronary artery. 2. Normal left anterior  descending artery and its branches. 3. Normal left circumflex artery and its branches. 4. Normal right coronary artery. 5. Mild left ventricular systolic dysfunction.  LVEDP 13 mmHg.  Ejection fraction 50%. 6.  Normal right heart pressure.  RECOMMENDATION:   Life style modification. Hydralazine for elevated blood pressure was given in holding area post procedure.

## 2012-11-21 NOTE — Progress Notes (Signed)
ANTICOAGULATION CONSULT NOTE - Follow Up  Pharmacy Consult for Heparin Indication: Chest Pain/ACS  No Known Allergies  Patient Measurements: Height: 5\' 11"  (180.3 cm) Weight: 189 lb 9.5 oz (86 kg) IBW/kg (Calculated) : 75.3  Vital Signs: Temp: 98.2 F (36.8 C) (04/01 0506) Temp src: Oral (04/01 0506) BP: 133/87 mmHg (04/01 0506) Pulse Rate: 57 (04/01 0506)  Labs:  Recent Labs  11/19/12 2327 11/20/12 0439 11/20/12 0935 11/20/12 1525 11/20/12 1754 11/20/12 1942 11/21/12 0449  HGB 13.2 12.5*  --   --   --  13.6 12.7*  HCT 37.9* 36.5*  --   --   --  39.2 37.4*  PLT 242 250  --   --   --  263 245  LABPROT  --  15.1  --   --   --  14.2  --   INR  --  1.21  --   --   --  1.11  --   HEPARINUNFRC  --   --  0.31  --  0.18*  --  0.38  CREATININE 0.94 0.91  --   --   --  1.02  --   TROPONINI  --  <0.30 <0.30 <0.30  --   --   --     Estimated Creatinine Clearance: 114.8 ml/min (by C-G formula based on Cr of 1.02).   Medications:  Scheduled:  . [EXPIRED] aspirin  324 mg Oral NOW   Or  . [EXPIRED] aspirin  300 mg Rectal NOW  . aspirin EC  81 mg Oral Daily  . atorvastatin  40 mg Oral q1800  . diazepam  5 mg Oral On Call  . [COMPLETED] heparin  3,000 Units Intravenous Once  . metoprolol tartrate  25 mg Oral BID  . [COMPLETED] regadenoson  0.4 mg Intravenous Once  . sodium chloride  3 mL Intravenous Q12H  . sodium chloride  3 mL Intravenous Q12H   Infusions:  . sodium chloride 50 mL/hr at 11/20/12 2139  . heparin 1,300 Units/hr (11/20/12 2131)  . [DISCONTINUED] nitroGLYCERIN 5 mcg/min (11/20/12 0334)    Assessment:  27 yo M admitted to [facility] on 11/20/12 with chest pain and r/o MI. IV heparin was started at 4am today.   HL= 0.38 after 3000 unit bolus and drip to 1300 units/hr @ Css  No IV interuptions, small amt of blood in stool reported to RN by pt.  Pt going to cath lab this am.  Goal of Therapy:  Heparin level 0.3-0.7 units/ml Monitor platelets by  anticoagulation protocol: Yes   Plan:  1) Continue heparin at 1100 units/hr (11 ml/hr) 2) F/U s/p cath lab and if further anticoag needed 3) Will order repeat HL after pt back from cath if still on heparin   Susanne Greenhouse R 11/21/2012,5:49 AM

## 2012-11-21 NOTE — Progress Notes (Signed)
Subjective:  Here for right and left heart catheterization. No chest pain. Afebrile.  Objective:  Vital Signs in the last 24 hours: Temp:  [98.2 F (36.8 C)-98.6 F (37 C)] 98.2 F (36.8 C) (04/01 0506) Pulse Rate:  [55-72] 57 (04/01 0506) Cardiac Rhythm:  [-] Normal sinus rhythm (03/31 2130) Resp:  [12-16] 16 (04/01 0506) BP: (133-156)/(72-100) 133/87 mmHg (04/01 0506) SpO2:  [100 %] 100 % (04/01 0506)  Physical Exam: BP Readings from Last 1 Encounters:  11/21/12 133/87     Wt Readings from Last 1 Encounters:  11/20/12 86 kg (189 lb 9.5 oz)    Weight change:   HEENT: [place]/AT, Eyes-Brown, PERL, EOMI, Conjunctiva-Pink, Sclera-Non-icteric Neck: No JVD, No bruit, Trachea midline. Lungs:  Clear, Bilateral. Cardiac:  Regular rhythm, normal S1 and S2, no S3.  Abdomen:  Soft, non-tender. Extremities:  No edema present. No cyanosis. No clubbing. CNS: AxOx3, Cranial nerves grossly intact, moves all 4 extremities. Right handed. Skin: Warm and dry.   Intake/Output from previous day: 03/31 0701 - 04/01 0700 In: 1055.8 [P.O.:540; I.V.:515.8] Out: -     Lab Results: BMET    Component Value Date/Time   NA 140 11/20/2012 1942   K 4.0 11/20/2012 1942   CL 105 11/20/2012 1942   CO2 24 11/20/2012 1942   GLUCOSE 90 11/20/2012 1942   BUN 12 11/20/2012 1942   CREATININE 1.02 11/20/2012 1942   CALCIUM 9.2 11/20/2012 1942   GFRNONAA >90 11/20/2012 1942   GFRAA >90 11/20/2012 1942   CBC    Component Value Date/Time   WBC 8.3 11/21/2012 0449   RBC 4.51 11/21/2012 0449   HGB 12.7* 11/21/2012 0449   HCT 37.4* 11/21/2012 0449   PLT 245 11/21/2012 0449   MCV 82.9 11/21/2012 0449   MCH 28.2 11/21/2012 0449   MCHC 34.0 11/21/2012 0449   RDW 14.1 11/21/2012 0449   CARDIAC ENZYMES Lab Results  Component Value Date   TROPONINI <0.30 11/20/2012    Scheduled Meds: . aspirin EC  81 mg Oral Daily  . atorvastatin  40 mg Oral q1800  . diazepam  5 mg Oral On Call  . metoprolol tartrate  25 mg Oral BID  . sodium  chloride  3 mL Intravenous Q12H  . sodium chloride  3 mL Intravenous Q12H   Continuous Infusions: . sodium chloride 50 mL/hr at 11/20/12 2139  . heparin Stopped (11/21/12 0627)   PRN Meds:.sodium chloride, sodium chloride, acetaminophen, ALPRAZolam, nitroGLYCERIN, ondansetron (ZOFRAN) IV, sodium chloride, sodium chloride  Assessment/Plan: Dilated cardiomyopathy Hypertension  Right and left heart cath today.    LOS: 2 days    Dixie Dials  MD  11/21/2012, 7:13 AM

## 2013-05-16 ENCOUNTER — Emergency Department (HOSPITAL_COMMUNITY)
Admission: EM | Admit: 2013-05-16 | Discharge: 2013-05-16 | Disposition: A | Discharge status: Home / Self Care | Payer: Self-pay | Attending: Emergency Medicine | Admitting: Emergency Medicine

## 2013-05-16 ENCOUNTER — Encounter (HOSPITAL_COMMUNITY): Payer: Self-pay | Admitting: Emergency Medicine

## 2013-05-16 DIAGNOSIS — L0231 Cutaneous abscess of buttock: Secondary | ICD-10-CM | POA: Insufficient documentation

## 2013-05-16 DIAGNOSIS — Z79899 Other long term (current) drug therapy: Secondary | ICD-10-CM | POA: Insufficient documentation

## 2013-05-16 DIAGNOSIS — F17200 Nicotine dependence, unspecified, uncomplicated: Secondary | ICD-10-CM | POA: Insufficient documentation

## 2013-05-16 DIAGNOSIS — I10 Essential (primary) hypertension: Secondary | ICD-10-CM | POA: Insufficient documentation

## 2013-05-16 DIAGNOSIS — L03317 Cellulitis of buttock: Secondary | ICD-10-CM

## 2013-05-16 MED ORDER — CLINDAMYCIN HCL 150 MG PO CAPS: 450.0000 mg | ORAL_CAPSULE | Freq: Three times a day (TID) | ORAL | Status: DC

## 2013-05-16 MED ORDER — HYDROCODONE-ACETAMINOPHEN 5-325 MG PO TABS: 1.0000 | ORAL_TABLET | ORAL | Status: DC | PRN

## 2013-05-16 MED ORDER — HYDROCODONE-ACETAMINOPHEN 5-325 MG PO TABS
1.0000 | ORAL_TABLET | Freq: Once | ORAL | Status: AC
  Administered 2013-05-16: 1 via ORAL
  Filled 2013-05-16: qty 1

## 2013-05-16 NOTE — ED Notes (Signed)
Pt states that he's had an abcess on his R buttock x 2 weeks that has gotten larger and more painful. States it has not been draining that he knows of. Afebrile.

## 2013-05-16 NOTE — ED Provider Notes (Signed)
CSN: [id number]     Arrival date & time 05/16/13  1759 History   First MD Initiated Contact with Patient 05/16/13 1818     Chief Complaint  Patient presents with  . Skin Abcess on Buttocks    (Consider location/radiation/quality/duration/timing/severity/associated sxs/prior Treatment) HPI Comments: Patient is a 27 yo M PMHx significant for HTN presenting to the ED for two weeks of worsening right buttock abscess. Patient states it became increasingly painful and larger over the last three days. Patient states his pain is 9/10 worsened with ambulating and palpation. Patient denies any drainage from the site. He denies any history of abscesses in the past. Patient denies any fevers, chills, diarrhea, bowel straining, melena, abdominal pain.    Past Medical History  Diagnosis Date  . Hypertension    Past Surgical History  Procedure Laterality Date  . Mandible fracture surgery  2006   No family history on file. History  Substance Use Topics  . Smoking status: Current Every Day Smoker  . Smokeless tobacco: Not on file  . Alcohol Use: Yes     Comment: occ    Review of Systems  Constitutional: Negative for fever and chills.  Gastrointestinal: Positive for rectal pain. Negative for nausea, vomiting, abdominal pain, diarrhea, constipation, blood in stool and anal bleeding.  Musculoskeletal: Negative for back pain.  Skin:       Abscess  All other systems reviewed and are negative.    Allergies  Shrimp  Home Medications   Current Outpatient Rx  Name  Route  Sig  Dispense  Refill  . lisinopril (ZESTRIL) 10 MG tablet   Oral   Take 1 tablet (10 mg total) by mouth daily.   30 tablet   1   . metoprolol tartrate (LOPRESSOR) 25 MG tablet   Oral   Take 1 tablet (25 mg total) by mouth 2 (two) times daily.   60 tablet   2   . clindamycin (CLEOCIN) 150 MG capsule   Oral   Take 3 capsules (450 mg total) by mouth 3 (three) times daily. May dispense as 150mg  capsules   90 capsule   0   . HYDROcodone-acetaminophen (NORCO/VICODIN) 5-325 MG per tablet   Oral   Take 1 tablet by mouth every 4 (four) hours as needed for pain.   12 tablet   0    BP 159/90  Pulse 87  Temp(Src) 98.4 F (36.9 C) (Oral)  SpO2 100% Physical Exam  Constitutional: He is oriented to person, place, and time. He appears well-developed and well-nourished. No distress.  HENT:  Head: Normocephalic and atraumatic.  Right Ear: External ear normal.  Left Ear: External ear normal.  Nose: Nose normal.  Eyes: Conjunctivae are normal.  Neck: Neck supple.  Cardiovascular: Normal rate, regular rhythm and normal heart sounds.   Pulmonary/Chest: Effort normal and breath sounds normal.  Abdominal: Soft. There is no tenderness.  Genitourinary: Rectal exam shows no external hemorrhoid, no internal hemorrhoid, no fissure, no mass and anal tone normal.     Musculoskeletal: Normal range of motion.  Neurological: He is alert and oriented to person, place, and time.  Skin: Skin is warm and dry. He is not diaphoretic.     Deep indurated area on right buttock beside rectum with extension into perineum. No tracting on rectal exam.  Psychiatric: He has a normal mood and affect.    ED Course  Procedures (including critical care time) Labs Review Labs Reviewed - No data to display Imaging Review No  results found.  MDM   1. Cellulitis of buttock, right     Afebrile, NAD, non-toxic appearing, AAOx4. Patient with right buttock cellulitic infection not involving rectum but with extension into perineum. This could evolve into an abscess requiring drainage but based on the location we'll send to Gen. surgery on Friday at clinic for further evaluation. Gen. surgery was consult on this case with that recommendation along with starting patient on clindamycin. Return precautions discussed. Patient agreeable to plan. Patient stable at time of discharge. Patient d/w with Dr. Juleen China, agrees with plan.       Jeannetta Ellis, PA-C 05/17/13 0050

## 2013-05-17 NOTE — ED Provider Notes (Signed)
Medical screening examination/treatment/procedure(s) were conducted as a shared visit with non-physician practitioner(s) and myself.  I personally evaluated the patient during the encounter  I interviewed and examined the patient. Lungs are CTAB. Cardiac exam wnl. Abdomen soft. The pt has a small area of induration on the right lower buttock extending to the perineum. Bedside US shows cellulitis and possibly early abscess, but no easily drainable fluid collection. As this is in a sensitive area, I discussed the case w/ on call surgeon. Will have the pt f/u in surgical ambulatory clinic this week. Will start clinda. The pt has no systemic sx and his VS are unremarkable here.   Junius Argyle, MD 05/17/13 1045

## 2013-05-18 ENCOUNTER — Ambulatory Visit (INDEPENDENT_AMBULATORY_CARE_PROVIDER_SITE_OTHER): Payer: Self-pay | Admitting: Surgery

## 2013-05-18 ENCOUNTER — Encounter (INDEPENDENT_AMBULATORY_CARE_PROVIDER_SITE_OTHER): Payer: Self-pay | Admitting: Surgery

## 2013-05-18 VITALS — BP 126/78 | HR 100 | Temp 97.1°F | Resp 14 | Ht 71.0 in | Wt 186.0 lb

## 2013-05-18 DIAGNOSIS — Z720 Tobacco use: Secondary | ICD-10-CM

## 2013-05-18 DIAGNOSIS — F17200 Nicotine dependence, unspecified, uncomplicated: Secondary | ICD-10-CM

## 2013-05-18 DIAGNOSIS — I420 Dilated cardiomyopathy: Secondary | ICD-10-CM

## 2013-05-18 DIAGNOSIS — K612 Anorectal abscess: Secondary | ICD-10-CM

## 2013-05-18 DIAGNOSIS — K611 Rectal abscess: Secondary | ICD-10-CM | POA: Insufficient documentation

## 2013-05-18 HISTORY — DX: Rectal abscess: K61.1

## 2013-05-18 HISTORY — DX: Dilated cardiomyopathy: I42.0

## 2013-05-18 HISTORY — PX: INCISION AND DRAINAGE PERIRECTAL ABSCESS: SHX1804

## 2013-05-18 MED ORDER — AMOXICILLIN-POT CLAVULANATE 875-125 MG PO TABS: 1.0000 | ORAL_TABLET | Freq: Two times a day (BID) | ORAL | Status: DC

## 2013-05-18 MED ORDER — OXYCODONE HCL 5 MG PO TABS
5.0000 mg | ORAL_TABLET | Freq: Four times a day (QID) | ORAL | Status: DC | PRN
Start: 2013-05-18 — End: 2013-06-11

## 2013-05-18 NOTE — Progress Notes (Signed)
Subjective:     Patient ID: Shawn [name], male   DOB: September 14, 1985, 27 y.o.   MRN: [id number]  HPI  Shawn [name]  10-12-1985 [id number]  Patient has no care team.  This patient is a 27 y.o.male who presents today for surgical evaluation at the request of Shawn [name], [place] [facility].   Reason for visit: Perirectal abscess.  Young smoking male.  Recently diagnosed with hypertension and cardiomyopathy.  Has been having pain around his anus for two weeks.  It became more intense.  He went to the emergency room.  Some inflammation noted in possible abscess.  They did not find nor feel comfortable in draining it.  Requested he be seen by surgery.  We fit him in today.  Patient notes one side spontaneously drained yesterday but the other side is now getting more swollen.  There is sensitive.  Denies any severe constipation or diarrhea.  No fevers or chills.  No personal nor family history of GI/colon cancer, inflammatory bowel disease, irritable bowel syndrome, allergy such as Celiac Sprue, dietary/dairy problems, colitis, ulcers nor gastritis.  No recent sick contacts/gastroenteritis.  No travel outside the country.  No changes in diet.    Patient Active Problem List   Diagnosis Date Noted  . Tobacco abuse 05/18/2013  . Dilated cardiomyopathy 05/18/2013  . Abscess, perirectal, x2  05/18/2013  . Chest pain at rest 11/20/2012  . Hypertension 11/20/2012    Past Medical History  Diagnosis Date  . Hypertension   . Dilated cardiomyopathy 05/18/2013    By catheterization 2014   . Abscess, perirectal, x2  05/18/2013    Past Surgical History  Procedure Laterality Date  . Mandible fracture surgery  2006    History   Social History  . Marital Status: Single    Spouse Name: N/A    Number of Children: N/A  . Years of Education: N/A   Occupational History  . Not on file.   Social History Main Topics  . Smoking status: Current Every Day Smoker -- 1.00 packs/day  .  Smokeless tobacco: Never Used  . Alcohol Use: Yes     Comment: occ  . Drug Use: No  . Sexual Activity: Not on file   Other Topics Concern  . Not on file   Social History Narrative  . No narrative on file    History reviewed. No pertinent family history.  Current Outpatient Prescriptions  Medication Sig Dispense Refill  . HYDROcodone-acetaminophen (NORCO/VICODIN) 5-325 MG per tablet Take 1 tablet by mouth every 4 (four) hours as needed for pain.  12 tablet  0  . lisinopril (ZESTRIL) 10 MG tablet Take 1 tablet (10 mg total) by mouth daily.  30 tablet  1  . metoprolol tartrate (LOPRESSOR) 25 MG tablet Take 1 tablet (25 mg total) by mouth 2 (two) times daily.  60 tablet  2  . amoxicillin-clavulanate (AUGMENTIN) 875-125 MG per tablet Take 1 tablet by mouth 2 (two) times daily.  14 tablet  3  . oxyCODONE (OXY IR/ROXICODONE) 5 MG immediate release tablet Take 1-2 tablets (5-10 mg total) by mouth every 6 (six) hours as needed for pain.  30 tablet  0   No current facility-administered medications for this visit.     Allergies  Allergen Reactions  . Shrimp [Shellfish Allergy] Shortness Of Breath    BP 126/78  Pulse 100  Temp(Src) 97.1 F (36.2 C) (Temporal)  Resp 14  Ht 5' 11"  (1.803 m)  Wt 186 lb (84.369  kg)  BMI 25.95 kg/m2  No results found.   Review of Systems  Constitutional: Negative for fever, chills, diaphoresis and fatigue.  HENT: Negative for sore throat, trouble swallowing and neck pain.   Eyes: Negative for photophobia and visual disturbance.  Respiratory: Negative for choking and shortness of breath.   Cardiovascular: Negative for chest pain and palpitations.  Gastrointestinal: Positive for rectal pain. Negative for nausea, vomiting, abdominal pain, diarrhea, constipation, abdominal distention and anal bleeding.  Genitourinary: Negative for dysuria, urgency, difficulty urinating and testicular pain.  Musculoskeletal: Negative for myalgias, arthralgias and gait  problem.  Skin: Negative for color change and rash.  Neurological: Negative for dizziness, speech difficulty, weakness and numbness.  Hematological: Negative for adenopathy.  Psychiatric/Behavioral: Negative for hallucinations, confusion and agitation. The patient is nervous/anxious.        Objective:   Physical Exam  Constitutional: He is oriented to person, place, and time. He appears well-developed and well-nourished. No distress.  HENT:  Head: Normocephalic.  Mouth/Throat: Oropharynx is clear and moist. No oropharyngeal exudate.  Eyes: Conjunctivae and EOM are normal. Pupils are equal, round, and reactive to light. No scleral icterus.  Neck: Normal range of motion. No tracheal deviation present.  Cardiovascular: Normal rate, normal heart sounds and intact distal pulses.   Pulmonary/Chest: Effort normal. No respiratory distress.  Abdominal: Soft. He exhibits no distension. There is no tenderness. Hernia confirmed negative in the right inguinal area and confirmed negative in the left inguinal area.  Genitourinary: Rectal exam shows tenderness.     Musculoskeletal: Normal range of motion. He exhibits no tenderness.  Neurological: He is alert and oriented to person, place, and time. No cranial nerve deficit. He exhibits normal muscle tone. Coordination normal.  Skin: Skin is warm and dry. No rash noted. He is not diaphoretic.  Psychiatric: His speech is normal and behavior is normal. His mood appears anxious. His affect is not labile and not inappropriate. Thought content is not paranoid. He expresses no homicidal and no suicidal ideation.       Assessment:     Anterior.  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     Plan:     Incision and drainage.  He was very sensitive and anxious, but eventually tolerated it:  The anatomy & physiology of the anorectal region was discussed.  The pathophysiology of anorectal abscess and differential diagnosis was discussed.  Natural history  progression such as fistula, gangrene, etc was discussed.   I stressed the importance of a bowel regimen to have daily soft bowel movements to minimize progression of disease.    The patient's symptoms are not adequately controlled.  Non-operative treatment has not healed the abscess.  Therefore, I recommended incision & drainage of the abscess to allow the infection to resolve and heal.  Technique, risks, benefits, alternatives discussed.  The patient expressed understanding & wished to proceed.  The patient was positioned lateral decubitus.  I placed a field block with local anaesthetic.  This took some time as he was very jumpy and sensitive, but did not want to deal with the cost of an operation.  I incised the skin over the abscess to release the infection.  I excised skin at the wound to have an adequate opening for drainage & prevent skin reclosure.  The abscess seemed rather superficial but tracked laterally.  I packed the wound with ribbon NU-Gauze.  The patient tolerated the procedure.  He felt a little woozy at first but then 10 minutes later was  getting dressed and felt fine.   Educational handouts further explaining the pathology, treatment options, and bowel regimen were given.  We will have the patient return to clinic for close follow up to make sure the infection heals  Stop smoking:  We talked to the patient about the dangers of smoking.  We stressed that tobacco use dramatically increases the risk of peri-operative complications such as infection, tissue necrosis leaving to problems with incision/wound and organ healing, heart attack, stroke, DVT, pulmonary embolism, and death.  We noted there are programs in our community to help stop smoking.

## 2013-05-18 NOTE — Patient Instructions (Addendum)
ANORECTAL SURGERY: POST OP INSTRUCTIONS  1. Take your usually prescribed home medications unless otherwise directed. 2. DIET: Follow a light bland diet the first 24 hours after arrival home, such as soup, liquids, crackers, etc.  Be sure to include lots of fluids daily.  Avoid fast food or heavy meals as your are more likely to get nauseated.  Eat a low fat the next few days after surgery.   3. PAIN CONTROL: a. Pain is best controlled by a usual combination of three different methods TOGETHER: i. Ice/Heat ii. Over the counter pain medication iii. Prescription pain medication b. Most patients will experience some swelling and discomfort in the anus/rectal area. and incisions.  Ice packs or heat (30-60 minutes up to 6 times a day) will help. Use ice for the first few days to help decrease swelling and bruising, then switch to heat such as warm towels, sitz baths, warm baths, etc to help relax tight/sore spots and speed recovery.  Some people prefer to use ice alone, heat alone, alternating between ice & heat.  Experiment to what works for you.  Swelling and bruising can take several weeks to resolve.   c. It is helpful to take an over-the-counter pain medication regularly for the first few weeks.  Choose one of the following that works best for you: i. Naproxen (Aleve, etc)  Two 269m tabs twice a day ii. Ibuprofen (Advil, etc) Three 2030mtabs four times a day (every meal & bedtime) iii. Acetaminophen (Tylenol, etc) 500-65033mour times a day (every meal & bedtime) d. A  prescription for pain medication (such as oxycodone, hydrocodone, etc) should be given to you upon discharge.  Take your pain medication as prescribed.  i. If you are having problems/concerns with the prescription medicine (does not control pain, nausea, vomiting, rash, itching, etc), please call us [phone] see if we need to switch you to a different pain medicine that will work better for you and/or control your side effect  better. ii. If you need a refill on your pain medication, please contact your pharmacy.  They will contact our office to request authorization. Prescriptions will not be filled after 5 pm or on week-ends. 4. KEEP YOUR BOWELS REGULAR a. The goal is one bowel movement a day b. Avoid getting constipated.  Between the surgery and the pain medications, it is common to experience some constipation.  Increasing fluid intake and taking a fiber supplement (such as Metamucil, Citrucel, FiberCon, MiraLax, etc) 1-2 times a day regularly will usually help prevent this problem from occurring.  A mild laxative (prune juice, Milk of Magnesia, MiraLax, etc) should be taken according to package directions if there are no bowel movements after 48 hours. c. Watch out for diarrhea.  If you have many loose bowel movements, simplify your diet to bland foods & liquids for a few days.  Stop any stool softeners and decrease your fiber supplement.  Switching to mild anti-diarrheal medications (Kayopectate, Pepto Bismol) can help.  If this worsens or does not improve, please call us.[place]. Wound Care a. Remove your bandages the day after surgery.  Unless discharge instructions indicate otherwise, leave your bandage dry and in place overnight.  Remove the bandage during your first bowel movement.   b. Allow the wound packing to fall out over the next few days.  You can trim exposed gauze / ribbon as it falls out.  You do not need to repack the wound unless instructed otherwise.  Wear an  absorbent pad or soft cotton gauze in your underwear as needed to catch any drainage and help keep the area  c. Keep the area clean and dry.  Bathe / shower every day.  Keep the area clean by showering / bathing over the incision / wound.   It is okay to soak an open wound to help wash it.  Wet wipes or showers / gentle washing after bowel movements is often less traumatic than regular toilet paper. d. Dennis Bast may have some styrofoam-like soft packing in  the rectum which will come out with the first bowel movement.  e. You will often notice bleeding with bowel movements.  This should slow down by the end of the first week of surgery f. Expect some drainage.  This should slow down, too, by the end of the first week of surgery.  Wear an absorbent pad or soft cotton gauze in your underwear until the drainage stops. 6. ACTIVITIES as tolerated:   a. You may resume regular (light) daily activities beginning the next day-such as daily self-care, walking, climbing stairs-gradually increasing activities as tolerated.  If you can walk 30 minutes without difficulty, it is safe to try more intense activity such as jogging, treadmill, bicycling, low-impact aerobics, swimming, etc. b. Save the most intensive and strenuous activity for last such as sit-ups, heavy lifting, contact sports, etc  Refrain from any heavy lifting or straining until you are off narcotics for pain control.   c. DO NOT PUSH THROUGH PAIN.  Let pain be your guide: If it hurts to do something, don't do it.  Pain is your body warning you to avoid that activity for another week until the pain goes down. d. You may drive when you are no longer taking prescription pain medication, you can comfortably sit for long periods of time, and you can safely maneuver your car and apply brakes. e. Dennis Bast may have sexual intercourse when it is comfortable.  7. FOLLOW UP in our office a. Please call [facility] at [phone] to set up an appointment to see your surgeon in the office for a follow-up appointment approximately 2 weeks after your surgery. b. Make sure that you call for this appointment the day you arrive home to insure a convenient appointment time. 10. IF YOU HAVE DISABILITY OR FAMILY LEAVE FORMS, BRING THEM TO THE OFFICE FOR PROCESSING.  DO NOT GIVE THEM TO YOUR DOCTOR.        WHEN TO CALL us [phone]: 1. Poor pain control 2. Reactions / problems with new medications (rash/itching, nausea,  etc)  3. Fever over 101.5 F (38.5 C) 4. Inability to urinate 5. Nausea and/or vomiting 6. Worsening swelling or bruising 7. Continued bleeding from incision. 8. Increased pain, redness, or drainage from the incision  The clinic staff is available to answer your questions during regular business hours (8:30am-5pm).  Please don't hesitate to call and ask to speak to one of our nurses for clinical concerns.   A surgeon from [facility] is always on call at the hospitals   If you have a medical emergency, go to the nearest emergency room or call 911.    [facility], [place], [place], [place], [place]  [address] ? MAIN: [phone] ? TOLL FREE: [phone] ? FAX [phone] [email or web address]   Abscess An abscess is an infected area that contains a collection of pus and debris.It can occur in almost any part of the body. An abscess is  also known as a furuncle or boil. CAUSES  An abscess occurs when tissue gets infected. This can occur from blockage of oil or sweat glands, infection of hair follicles, or a minor injury to the skin. As the body tries to fight the infection, pus collects in the area and creates pressure under the skin. This pressure causes pain. People with weakened immune systems have difficulty fighting infections and get certain abscesses more often.  SYMPTOMS Usually an abscess develops on the skin and becomes a painful mass that is red, warm, and tender. If the abscess forms under the skin, you may feel a moveable soft area under the skin. Some abscesses break open (rupture) on their own, but most will continue to get worse without care. The infection can spread deeper into the body and eventually into the bloodstream, causing you to feel ill.  DIAGNOSIS  Your caregiver will take your medical history and perform a physical exam. A sample of fluid may also be taken from the abscess to determine what is causing  your infection. TREATMENT  Your caregiver may prescribe antibiotic medicines to fight the infection. However, taking antibiotics alone usually does not cure an abscess. Your caregiver may need to make a small cut (incision) in the abscess to drain the pus. In some cases, gauze is packed into the abscess to reduce pain and to continue draining the area. HOME CARE INSTRUCTIONS   Only take over-the-counter or prescription medicines for pain, discomfort, or fever as directed by your caregiver.  If you were prescribed antibiotics, take them as directed. Finish them even if you start to feel better.  If gauze is used, follow your caregiver's directions for changing the gauze.  To avoid spreading the infection:  Keep your draining abscess covered with a bandage.  Wash your hands well.  Do not share personal care items, towels, or whirlpools with others.  Avoid skin contact with others.  Keep your skin and clothes clean around the abscess.  Keep all follow-up appointments as directed by your caregiver. SEEK MEDICAL CARE IF:   You have increased pain, swelling, redness, fluid drainage, or bleeding.  You have muscle aches, chills, or a general ill feeling.  You have a fever. MAKE SURE YOU:   Understand these instructions.  Will watch your condition.  Will get help right away if you are not doing well or get worse. Document Released: 05/19/2005 Document Revised: 02/08/2012 Document Reviewed: 10/22/2011 [facility] Patient Information 2014 [place].  We strongly recommend that you stop smoking.  Smoking increases the risk of surgery including infection in the form of an open wound, pus formation, abscess, hernia at an incision on the abdomen, etc.  You have an increased risk of other MAJOR complications such as stroke, heart attack, forming clots in the leg and/or lungs, and death.    While it can be one of the most difficult things to do, the [place] community has programs to help  you stop.  Consider talking with your primary care physician about options.  Also, Smoking Cessation classes are available through the [facility]:  The smoking cessation program is a proven-effective program from the American [employer] Association. The program is available for anyone 8 and older who currently smokes. The program lasts for 7 weeks and is 8 sessions. Each class will be approximately 1 1/2 hours. The program is every Tuesday.  All classes are 12-1:30pm and same location.  Event Location Information:  Location: [place] 2nd Floor Conference Room  2-037; located next to [facility] cross streets: [address]ance into the [facility] is adjacent to the [employer] main entrance. The conference room is located on the 2nd floor.  Parking Instructions: Visitor parking is adjacent to [employer] main entrance and the [employer] [phone] or check the Classes and Support Groups   [email or web address] the event of inclemet weather please call [phone] or view online at [email or web address]

## 2013-05-23 ENCOUNTER — Telehealth (INDEPENDENT_AMBULATORY_CARE_PROVIDER_SITE_OTHER): Payer: Self-pay

## 2013-05-23 ENCOUNTER — Encounter (INDEPENDENT_AMBULATORY_CARE_PROVIDER_SITE_OTHER): Payer: Self-pay | Admitting: Surgery

## 2013-05-23 NOTE — Telephone Encounter (Signed)
LMOM for pt to call me so I can check on him since he no showed for his appt today with Dr Michaell Cowing. I want to make sure he is taking his antibiotics and we need to make him appt to see Dr Michaell Cowing again. The pt needs to be checked to make sure he is healing ok per Dr Michaell Cowing.

## 2013-05-30 ENCOUNTER — Encounter (INDEPENDENT_AMBULATORY_CARE_PROVIDER_SITE_OTHER): Payer: Self-pay | Admitting: Surgery

## 2013-05-30 ENCOUNTER — Ambulatory Visit (INDEPENDENT_AMBULATORY_CARE_PROVIDER_SITE_OTHER): Payer: Self-pay | Admitting: Surgery

## 2013-05-30 VITALS — BP 122/64 | HR 72 | Resp 18 | Ht 71.5 in | Wt 188.0 lb

## 2013-05-30 DIAGNOSIS — K611 Rectal abscess: Secondary | ICD-10-CM

## 2013-05-30 DIAGNOSIS — K612 Anorectal abscess: Secondary | ICD-10-CM

## 2013-05-30 DIAGNOSIS — F17200 Nicotine dependence, unspecified, uncomplicated: Secondary | ICD-10-CM

## 2013-05-30 DIAGNOSIS — Z720 Tobacco use: Secondary | ICD-10-CM

## 2013-05-30 NOTE — Progress Notes (Signed)
Subjective:     Patient ID: Shawn [name], male   DOB: Jan 24, 1986, 27 y.o.   MRN: [id number]  HPI  Shawn [name]  11-05-1985 [id number]  Patient Care Team: Provider Not In System as PCP - General  Procedure (Date: 05/18/2013):  Incision and drainage of anterior perirectal abscesses x2  This patient returns for surgical re-evaluation.  His pain is gone down.  It has not gone away.  He continues to smoke.  No fevers or chills.  No new areas.  He is still taking his antibiotics.  I asked more about this, he notes he is only taking the Augmentin once a day.  Patient Active Problem List   Diagnosis Date Noted  . Tobacco abuse 05/18/2013  . Dilated cardiomyopathy 05/18/2013  . Abscess, perirectal, x2  05/18/2013  . Chest pain at rest 11/20/2012  . Hypertension 11/20/2012    Past Medical History  Diagnosis Date  . Hypertension   . Dilated cardiomyopathy 05/18/2013    By catheterization 2014   . Abscess, perirectal, x2  05/18/2013    Past Surgical History  Procedure Laterality Date  . Mandible fracture surgery  2006    History   Social History  . Marital Status: Single    Spouse Name: N/A    Number of Children: N/A  . Years of Education: N/A   Occupational History  . Not on file.   Social History Main Topics  . Smoking status: Current Every Day Smoker -- 1.00 packs/day  . Smokeless tobacco: Never Used  . Alcohol Use: Yes     Comment: occ  . Drug Use: No  . Sexual Activity: Not on file   Other Topics Concern  . Not on file   Social History Narrative  . No narrative on file    History reviewed. No pertinent family history.  Current Outpatient Prescriptions  Medication Sig Dispense Refill  . amoxicillin-clavulanate (AUGMENTIN) 875-125 MG per tablet Take 1 tablet by mouth 2 (two) times daily.  14 tablet  3  . lisinopril (ZESTRIL) 10 MG tablet Take 1 tablet (10 mg total) by mouth daily.  30 tablet  1  . metoprolol tartrate (LOPRESSOR) 25 MG tablet Take 1  tablet (25 mg total) by mouth 2 (two) times daily.  60 tablet  2  . HYDROcodone-acetaminophen (NORCO/VICODIN) 5-325 MG per tablet Take 1 tablet by mouth every 4 (four) hours as needed for pain.  12 tablet  0  . oxyCODONE (OXY IR/ROXICODONE) 5 MG immediate release tablet Take 1-2 tablets (5-10 mg total) by mouth every 6 (six) hours as needed for pain.  30 tablet  0   No current facility-administered medications for this visit.     Allergies  Allergen Reactions  . Shrimp [Shellfish Allergy] Shortness Of Breath    BP 122/64  Pulse 72  Resp 18  Ht 5' 11.5" (1.816 m)  Wt 188 lb (85.276 kg)  BMI 25.86 kg/m2  No results found.   Review of Systems  Constitutional: Negative for fever, chills and diaphoresis.  HENT: Negative for sore throat and trouble swallowing.   Eyes: Negative for photophobia and visual disturbance.  Respiratory: Negative for choking, wheezing and stridor.   Cardiovascular: Negative for palpitations.  Gastrointestinal: Positive for rectal pain. Negative for nausea, vomiting, abdominal pain, diarrhea, constipation, blood in stool, abdominal distention and anal bleeding.  Genitourinary: Negative for dysuria, urgency, difficulty urinating and testicular pain.  Musculoskeletal: Negative for arthralgias, gait problem, myalgias and neck pain.  Skin: Negative for color change  and rash.  Neurological: Negative for dizziness, speech difficulty, weakness and numbness.  Hematological: Negative for adenopathy.  Psychiatric/Behavioral: Negative for hallucinations, confusion and agitation.       Objective:   Physical Exam  Constitutional: He is oriented to person, place, and time. He appears well-developed and well-nourished. No distress.  HENT:  Head: Normocephalic.  Mouth/Throat: Oropharynx is clear and moist. No oropharyngeal exudate.  Eyes: Conjunctivae and EOM are normal. Pupils are equal, round, and reactive to light. No scleral icterus.  Neck: Normal range of motion.  No tracheal deviation present.  Cardiovascular: Normal rate, normal heart sounds and intact distal pulses.   Pulmonary/Chest: Effort normal. No respiratory distress.  Abdominal: Soft. He exhibits no distension. There is no tenderness. Hernia confirmed negative in the right inguinal area and confirmed negative in the left inguinal area.  Genitourinary:     Musculoskeletal: Normal range of motion. He exhibits no tenderness.  Neurological: He is alert and oriented to person, place, and time. No cranial nerve deficit. He exhibits normal muscle tone. Coordination normal.  Skin: Skin is warm and dry. No rash noted. He is not diaphoretic.  Psychiatric: He has a normal mood and affect. His behavior is normal.       Assessment:     Much improved status post incision of anterior horseshoe-like pararectal abscess     Plan:     I discussed with him again the importance of completing the antibiotics.  He needs to go to twice a day until he is done.  I worry with his smoking, his risk of recurrence is high.  I again discussed stopping smoking with him.  I think he got the message.  He promises to try better.  Return to clinic in 2 weeks to make sure it is healed.  Otherwise as needed.   Instructions discussed.  Followup with primary care physician for other health issues as would normally be done.  Questions answered.  The patient expressed understanding and appreciation

## 2013-05-30 NOTE — Patient Instructions (Signed)
ANORECTAL SURGERY: POST OP INSTRUCTIONS  1. Take your usually prescribed home medications unless otherwise directed. 2. DIET: Follow a light bland diet the first 24 hours after arrival home, such as soup, liquids, crackers, etc.  Be sure to include lots of fluids daily.  Avoid fast food or heavy meals as your are more likely to get nauseated.  Eat a low fat the next few days after surgery.   3. PAIN CONTROL: a. Pain is best controlled by a usual combination of three different methods TOGETHER: i. Ice/Heat ii. Over the counter pain medication iii. Prescription pain medication b. Most patients will experience some swelling and discomfort in the anus/rectal area. and incisions.  Ice packs or heat (30-60 minutes up to 6 times a day) will help. Use ice for the first few days to help decrease swelling and bruising, then switch to heat such as warm towels, sitz baths, warm baths, etc to help relax tight/sore spots and speed recovery.  Some people prefer to use ice alone, heat alone, alternating between ice & heat.  Experiment to what works for you.  Swelling and bruising can take several weeks to resolve.   c. It is helpful to take an over-the-counter pain medication regularly for the first few weeks.  Choose one of the following that works best for you: i. Naproxen (Aleve, etc)  Two 226m tabs twice a day ii. Ibuprofen (Advil, etc) Three 2028mtabs four times a day (every meal & bedtime) iii. Acetaminophen (Tylenol, etc) 500-65020mour times a day (every meal & bedtime) d. A  prescription for pain medication (such as oxycodone, hydrocodone, etc) should be given to you upon discharge.  Take your pain medication as prescribed.  i. If you are having problems/concerns with the prescription medicine (does not control pain, nausea, vomiting, rash, itching, etc), please call us [phone] see if we need to switch you to a different pain medicine that will work better for you and/or control your side effect  better. ii. If you need a refill on your pain medication, please contact your pharmacy.  They will contact our office to request authorization. Prescriptions will not be filled after 5 pm or on week-ends. 4. KEEP YOUR BOWELS REGULAR a. The goal is one bowel movement a day b. Avoid getting constipated.  Between the surgery and the pain medications, it is common to experience some constipation.  Increasing fluid intake and taking a fiber supplement (such as Metamucil, Citrucel, FiberCon, MiraLax, etc) 1-2 times a day regularly will usually help prevent this problem from occurring.  A mild laxative (prune juice, Milk of Magnesia, MiraLax, etc) should be taken according to package directions if there are no bowel movements after 48 hours. c. Watch out for diarrhea.  If you have many loose bowel movements, simplify your diet to bland foods & liquids for a few days.  Stop any stool softeners and decrease your fiber supplement.  Switching to mild anti-diarrheal medications (Kayopectate, Pepto Bismol) can help.  If this worsens or does not improve, please call us.[place]. Wound Care a. Remove your bandages the day after surgery.  Unless discharge instructions indicate otherwise, leave your bandage dry and in place overnight.  Remove the bandage during your first bowel movement.   b. Allow the wound packing to fall out over the next few days.  You can trim exposed gauze / ribbon as it falls out.  You do not need to repack the wound unless instructed otherwise.  Wear an  absorbent pad or soft cotton gauze in your underwear as needed to catch any drainage and help keep the area  c. Keep the area clean and dry.  Bathe / shower every day.  Keep the area clean by showering / bathing over the incision / wound.   It is okay to soak an open wound to help wash it.  Wet wipes or showers / gentle washing after bowel movements is often less traumatic than regular toilet paper. d. Dennis Bast may have some styrofoam-like soft packing in  the rectum which will come out with the first bowel movement.  e. You will often notice bleeding with bowel movements.  This should slow down by the end of the first week of surgery f. Expect some drainage.  This should slow down, too, by the end of the first week of surgery.  Wear an absorbent pad or soft cotton gauze in your underwear until the drainage stops. 6. ACTIVITIES as tolerated:   a. You may resume regular (light) daily activities beginning the next day-such as daily self-care, walking, climbing stairs-gradually increasing activities as tolerated.  If you can walk 30 minutes without difficulty, it is safe to try more intense activity such as jogging, treadmill, bicycling, low-impact aerobics, swimming, etc. b. Save the most intensive and strenuous activity for last such as sit-ups, heavy lifting, contact sports, etc  Refrain from any heavy lifting or straining until you are off narcotics for pain control.   c. DO NOT PUSH THROUGH PAIN.  Let pain be your guide: If it hurts to do something, don't do it.  Pain is your body warning you to avoid that activity for another week until the pain goes down. d. You may drive when you are no longer taking prescription pain medication, you can comfortably sit for long periods of time, and you can safely maneuver your car and apply brakes. e. Dennis Bast may have sexual intercourse when it is comfortable.  7. FOLLOW UP in our office a. Please call [facility] at [phone] to set up an appointment to see your surgeon in the office for a follow-up appointment approximately 2 weeks after your surgery. b. Make sure that you call for this appointment the day you arrive home to insure a convenient appointment time. 10. IF YOU HAVE DISABILITY OR FAMILY LEAVE FORMS, BRING THEM TO THE OFFICE FOR PROCESSING.  DO NOT GIVE THEM TO YOUR DOCTOR.        WHEN TO CALL us [phone]: 1. Poor pain control 2. Reactions / problems with new medications (rash/itching, nausea,  etc)  3. Fever over 101.5 F (38.5 C) 4. Inability to urinate 5. Nausea and/or vomiting 6. Worsening swelling or bruising 7. Continued bleeding from incision. 8. Increased pain, redness, or drainage from the incision  The clinic staff is available to answer your questions during regular business hours (8:30am-5pm).  Please don't hesitate to call and ask to speak to one of our nurses for clinical concerns.   A surgeon from [facility] is always on call at the hospitals   If you have a medical emergency, go to the nearest emergency room or call 911.    [facility], [address], [place], [place], [place]  [address] ? MAIN: [phone] ? TOLL FREE: [phone] ? FAX [phone] [email or web address]  We strongly recommend that you stop smoking.  Smoking increases the risk of surgery including infection in the form of an open wound, pus formation, abscess, hernia at  an incision on the abdomen, etc.  You have an increased risk of other MAJOR complications such as stroke, heart attack, forming clots in the leg and/or lungs, and death.    While it can be one of the most difficult things to do, the [place] community has programs to help you stop.  Consider talking with your primary care physician about options.  Also, Smoking Cessation classes are available through the [facility]:  The smoking cessation program is a proven-effective program from the American Lung Association. The program is available for anyone 48 and older who currently smokes. The program lasts for 7 weeks and is 8 sessions. Each class will be approximately 1 1/2 hours. The program is every Tuesday.  All classes are 12-1:30pm and same location.  Event Location Information:  Location: [place] 2nd Floor Conference Room 2-037; located next to [facility] cross streets: [place] Entrance into the [facility] is adjacent to the [employer] main entrance. The conference room is located on the 2nd floor.  Parking Instructions: Visitor parking is adjacent to [employer] main entrance and the [employer] [phone] or check the Classes and Support Groups   [email or web address] the event of inclemet weather please call [phone] or view online at [email or web address]  GETTING TO GOOD BOWEL HEALTH. Irregular bowel habits such as constipation and diarrhea can lead to many problems over time.  Having one soft bowel movement a day is the most important way to prevent further problems.  The anorectal canal is designed to handle stretching and feces to safely manage our ability to get rid of solid waste (feces, poop, stool) out of our body.  BUT, hard constipated stools can act like ripping concrete bricks and diarrhea can be a burning fire to this very sensitive area of our body, causing inflamed hemorrhoids, anal fissures, increasing risk is perirectal abscesses, abdominal pain/bloating, an making irritable bowel worse.     The goal: ONE SOFT BOWEL MOVEMENT A DAY!  To have soft, regular bowel movements:    Drink at least 8 tall glasses of water a day.     Take plenty of fiber.  Fiber is the undigested part of plant food that passes into the colon, acting s "natures broom" to encourage bowel motility and movement.  Fiber can absorb and hold large amounts of water. This results in a larger, bulkier stool, which is soft and easier to pass. Work gradually over several weeks up to 6 servings a day of fiber (25g a day even more if needed) in the form of: o Vegetables -- Root (potatoes, carrots, turnips), leafy green (lettuce, salad greens, celery, spinach), or cooked high residue (cabbage, broccoli, etc) o Fruit -- Fresh (unpeeled skin & pulp), Dried (prunes, apricots, cherries, etc ),  or stewed ( applesauce)  o Whole grain breads, pasta, etc  (whole wheat)  o Bran cereals    Bulking Agents -- This type of water-retaining fiber generally is easily obtained each day by one of the following:  o Psyllium bran -- The psyllium plant is remarkable because its ground seeds can retain so much water. This product is available as Metamucil, Konsyl, Effersyllium, Per Diem Fiber, or the less expensive generic preparation in drug and health food stores. Although labeled a laxative, it really is not a laxative.  o Methylcellulose -- This is another fiber derived from wood which also retains water. It  is available as Citrucel. o Polyethylene Glycol - and "artificial" fiber commonly called Miralax or Glycolax.  It is helpful for people with gassy or bloated feelings with regular fiber o Flax Seed - a less gassy fiber than psyllium   No reading or other relaxing activity while on the toilet. If bowel movements take longer than 5 minutes, you are too constipated   AVOID CONSTIPATION.  High fiber and water intake usually takes care of this.  Sometimes a laxative is needed to stimulate more frequent bowel movements, but    Laxatives are not a good long-term solution as it can wear the colon out. o Osmotics (Milk of Magnesia, Fleets phosphosoda, Magnesium citrate, MiraLax, GoLytely) are safer than  o Stimulants (Senokot, Castor Oil, Dulcolax, Ex Lax)    o Do not take laxatives for more than 7days in a row.    IF SEVERELY CONSTIPATED, try a Bowel Retraining Program: o Do not use laxatives.  o Eat a diet high in roughage, such as bran cereals and leafy vegetables.  o Drink six (6) ounces of prune or apricot juice each morning.  o Eat two (2) large servings of stewed fruit each day.  o Take one (1) heaping tablespoon of a psyllium-based bulking agent twice a day. Use sugar-free sweetener when possible to avoid excessive calories.  o Eat a normal breakfast.  o Set aside 15 minutes after breakfast to sit on the toilet, but do not strain to have a bowel  movement.  o If you do not have a bowel movement by the third day, use an enema and repeat the above steps.    Controlling diarrhea o Switch to liquids and simpler foods for a few days to avoid stressing your intestines further. o Avoid dairy products (especially milk & ice cream) for a short time.  The intestines often can lose the ability to digest lactose when stressed. o Avoid foods that cause gassiness or bloating.  Typical foods include beans and other legumes, cabbage, broccoli, and dairy foods.  Every person has some sensitivity to other foods, so listen to our body and avoid those foods that trigger problems for you. o Adding fiber (Citrucel, Metamucil, psyllium, Miralax) gradually can help thicken stools by absorbing excess fluid and retrain the intestines to act more normally.  Slowly increase the dose over a few weeks.  Too much fiber too soon can backfire and cause cramping & bloating. o Probiotics (such as active yogurt, Align, etc) may help repopulate the intestines and colon with normal bacteria and calm down a sensitive digestive tract.  Most studies show it to be of mild help, though, and such products can be costly. o Medicines:   Bismuth subsalicylate (ex. Kayopectate, Pepto Bismol) every 30 minutes for up to 6 doses can help control diarrhea.  Avoid if pregnant.   Loperamide (Immodium) can slow down diarrhea.  Start with two tablets (76m total) first and then try one tablet every 6 hours.  Avoid if you are having fevers or severe pain.  If you are not better or start feeling worse, stop all medicines and call your doctor for advice o Call your doctor if you are getting worse or not better.  Sometimes further testing (cultures, endoscopy, X-ray studies, bloodwork, etc) may be needed to help diagnose and treat the cause of the diarrhea. o

## 2013-06-11 ENCOUNTER — Emergency Department (HOSPITAL_COMMUNITY)
Admission: EM | Admit: 2013-06-11 | Discharge: 2013-06-11 | Disposition: A | Discharge status: Home / Self Care | Payer: No Typology Code available for payment source | Attending: Emergency Medicine | Admitting: Emergency Medicine

## 2013-06-11 ENCOUNTER — Encounter (HOSPITAL_COMMUNITY): Payer: Self-pay | Admitting: Emergency Medicine

## 2013-06-11 DIAGNOSIS — F17200 Nicotine dependence, unspecified, uncomplicated: Secondary | ICD-10-CM | POA: Insufficient documentation

## 2013-06-11 DIAGNOSIS — I10 Essential (primary) hypertension: Secondary | ICD-10-CM | POA: Insufficient documentation

## 2013-06-11 DIAGNOSIS — IMO0002 Reserved for concepts with insufficient information to code with codable children: Secondary | ICD-10-CM | POA: Insufficient documentation

## 2013-06-11 DIAGNOSIS — M549 Dorsalgia, unspecified: Secondary | ICD-10-CM | POA: Insufficient documentation

## 2013-06-11 DIAGNOSIS — I428 Other cardiomyopathies: Secondary | ICD-10-CM | POA: Insufficient documentation

## 2013-06-11 DIAGNOSIS — Z79899 Other long term (current) drug therapy: Secondary | ICD-10-CM | POA: Insufficient documentation

## 2013-06-11 DIAGNOSIS — Y92410 Unspecified street and highway as the place of occurrence of the external cause: Secondary | ICD-10-CM | POA: Insufficient documentation

## 2013-06-11 DIAGNOSIS — Y9389 Activity, other specified: Secondary | ICD-10-CM | POA: Insufficient documentation

## 2013-06-11 MED ORDER — IBUPROFEN 800 MG PO TABS: 800.0000 mg | ORAL_TABLET | Freq: Three times a day (TID) | ORAL | Status: DC

## 2013-06-11 MED ORDER — CYCLOBENZAPRINE HCL 10 MG PO TABS: 10.0000 mg | ORAL_TABLET | Freq: Two times a day (BID) | ORAL | Status: DC | PRN
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
## 2013-06-15 NOTE — ED Provider Notes (Signed)
Medical screening examination/treatment/procedure(s) were performed by non-physician practitioner and as supervising physician I was immediately available for consultation/collaboration.  EKG Interpretation   None         Merryl Hacker, MD 06/15/13 0730

## 2013-07-09 ENCOUNTER — Emergency Department (HOSPITAL_COMMUNITY)
Admission: EM | Admit: 2013-07-09 | Discharge: 2013-07-09 | Disposition: A | Discharge status: Home / Self Care | Payer: Self-pay | Attending: Emergency Medicine | Admitting: Emergency Medicine

## 2013-07-09 ENCOUNTER — Emergency Department (HOSPITAL_COMMUNITY): Payer: Self-pay

## 2013-07-09 ENCOUNTER — Encounter (HOSPITAL_COMMUNITY): Payer: Self-pay | Admitting: Emergency Medicine

## 2013-07-09 DIAGNOSIS — R209 Unspecified disturbances of skin sensation: Secondary | ICD-10-CM | POA: Insufficient documentation

## 2013-07-09 DIAGNOSIS — Z79899 Other long term (current) drug therapy: Secondary | ICD-10-CM | POA: Insufficient documentation

## 2013-07-09 DIAGNOSIS — Z872 Personal history of diseases of the skin and subcutaneous tissue: Secondary | ICD-10-CM | POA: Insufficient documentation

## 2013-07-09 DIAGNOSIS — I10 Essential (primary) hypertension: Secondary | ICD-10-CM | POA: Insufficient documentation

## 2013-07-09 DIAGNOSIS — Z7982 Long term (current) use of aspirin: Secondary | ICD-10-CM | POA: Insufficient documentation

## 2013-07-09 DIAGNOSIS — F17200 Nicotine dependence, unspecified, uncomplicated: Secondary | ICD-10-CM | POA: Insufficient documentation

## 2013-07-09 DIAGNOSIS — R0789 Other chest pain: Secondary | ICD-10-CM | POA: Insufficient documentation

## 2013-07-09 LAB — CBC WITH DIFFERENTIAL/PLATELET
Eosinophils Absolute: 0.4 10*3/uL (ref 0.0–0.7)
Hemoglobin: 13.2 g/dL (ref 13.0–17.0)
Lymphocytes Relative: 32 % (ref 12–46)
Lymphs Abs: 2.2 10*3/uL (ref 0.7–4.0)
MCH: 28.7 pg (ref 26.0–34.0)
Monocytes Relative: 6 % (ref 3–12)
Neutrophils Relative %: 56 % (ref 43–77)
RBC: 4.6 MIL/uL (ref 4.22–5.81)
WBC: 6.9 10*3/uL (ref 4.0–10.5)

## 2013-07-09 LAB — BASIC METABOLIC PANEL
BUN: 16 mg/dL (ref 6–23)
CO2: 26 mEq/L (ref 19–32)
Chloride: 101 mEq/L (ref 96–112)
GFR calc non Af Amer: 88 mL/min — ABNORMAL LOW (ref >90)
Glucose, Bld: 95 mg/dL (ref 70–99)
Potassium: 4 mEq/L (ref 3.5–5.1)

## 2013-07-09 LAB — D-DIMER, QUANTITATIVE: D-Dimer, Quant: 0.41 ug/mL-FEU (ref 0.00–0.48)

## 2013-07-09 IMAGING — CR DG CHEST 2V
2 series · 2 of 2 positions shown · non-contrast
Comparison: [DATE]

CLINICAL DATA: CHEST PAIN, SMOKER

EXAM:
CHEST  2 VIEW

[w chest pa]
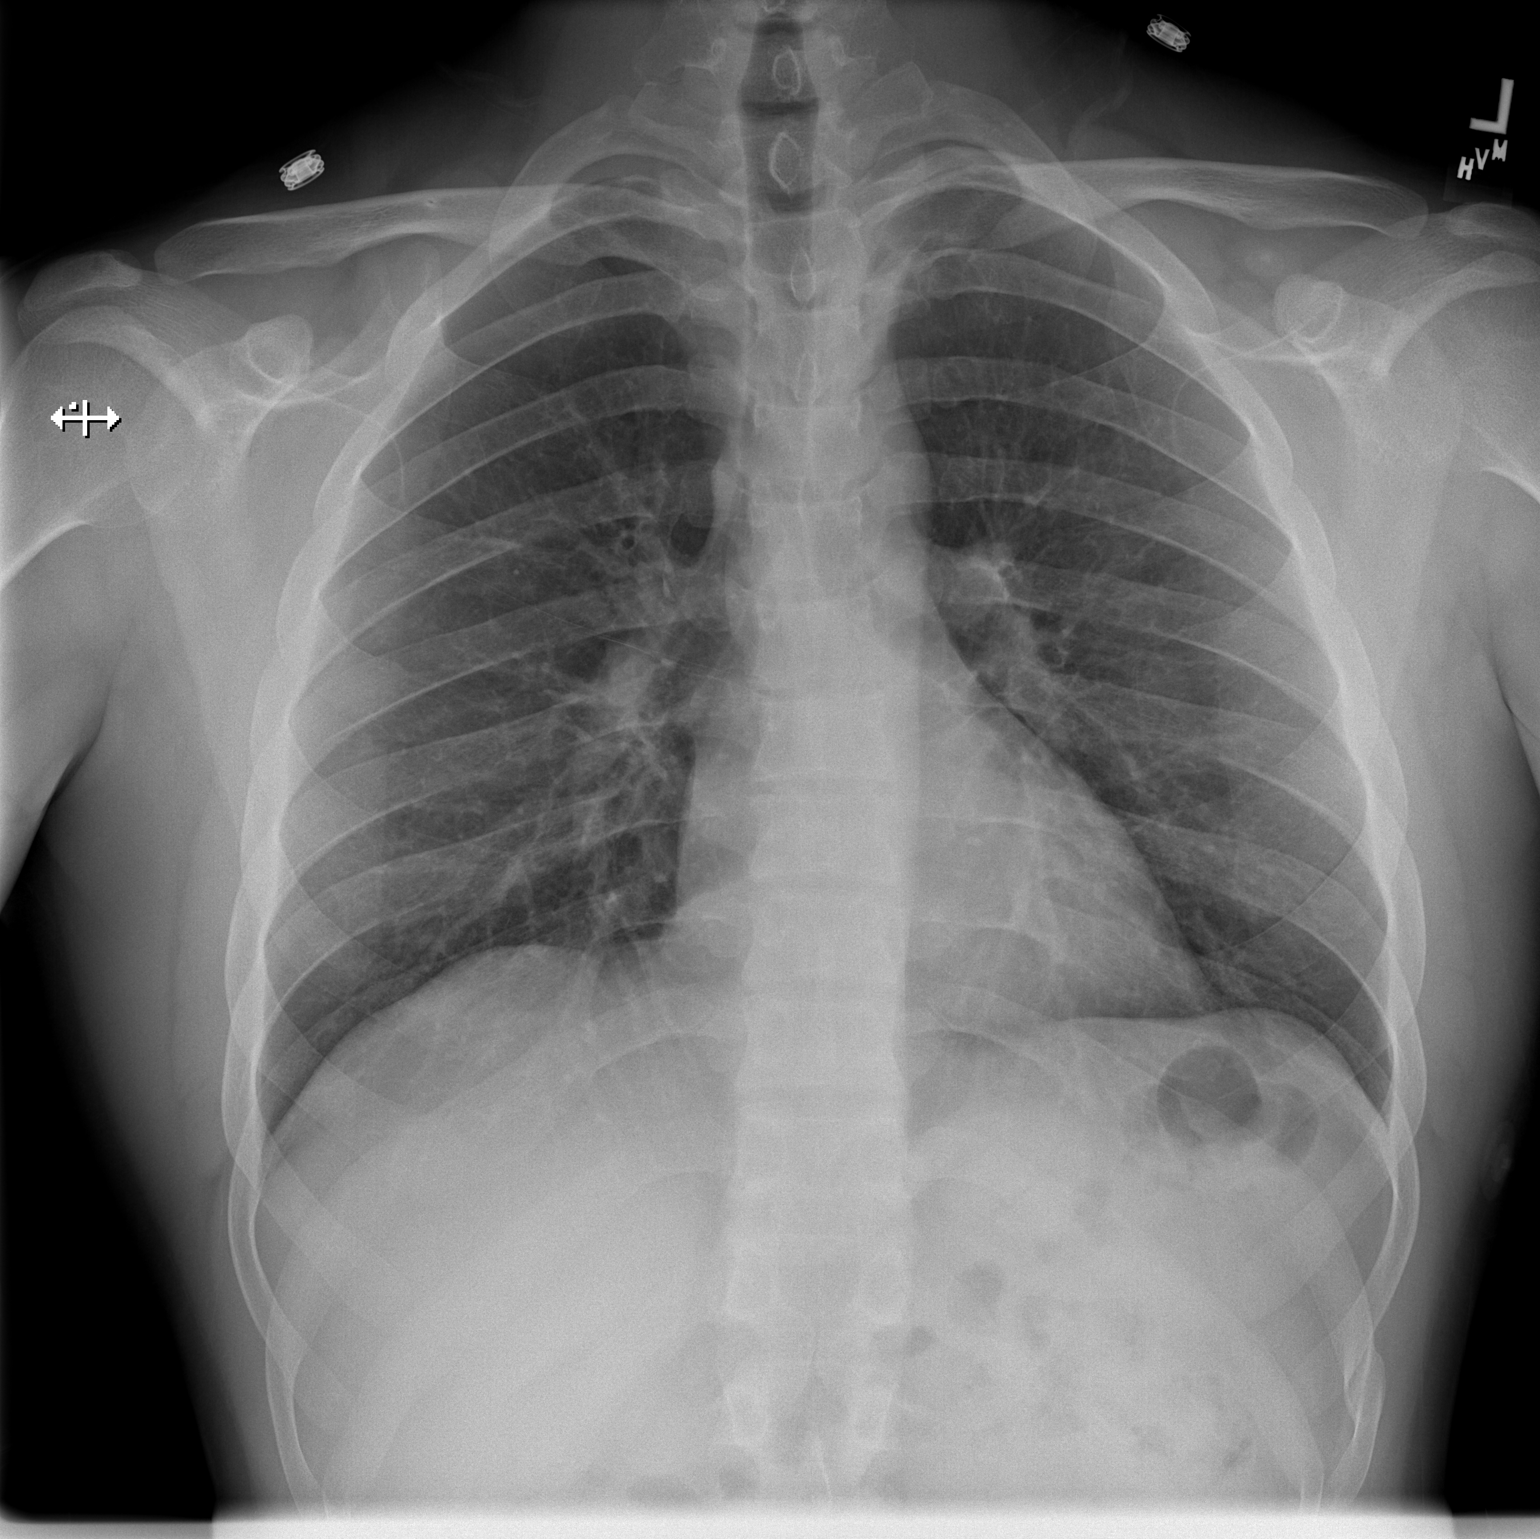

[w chest lat]
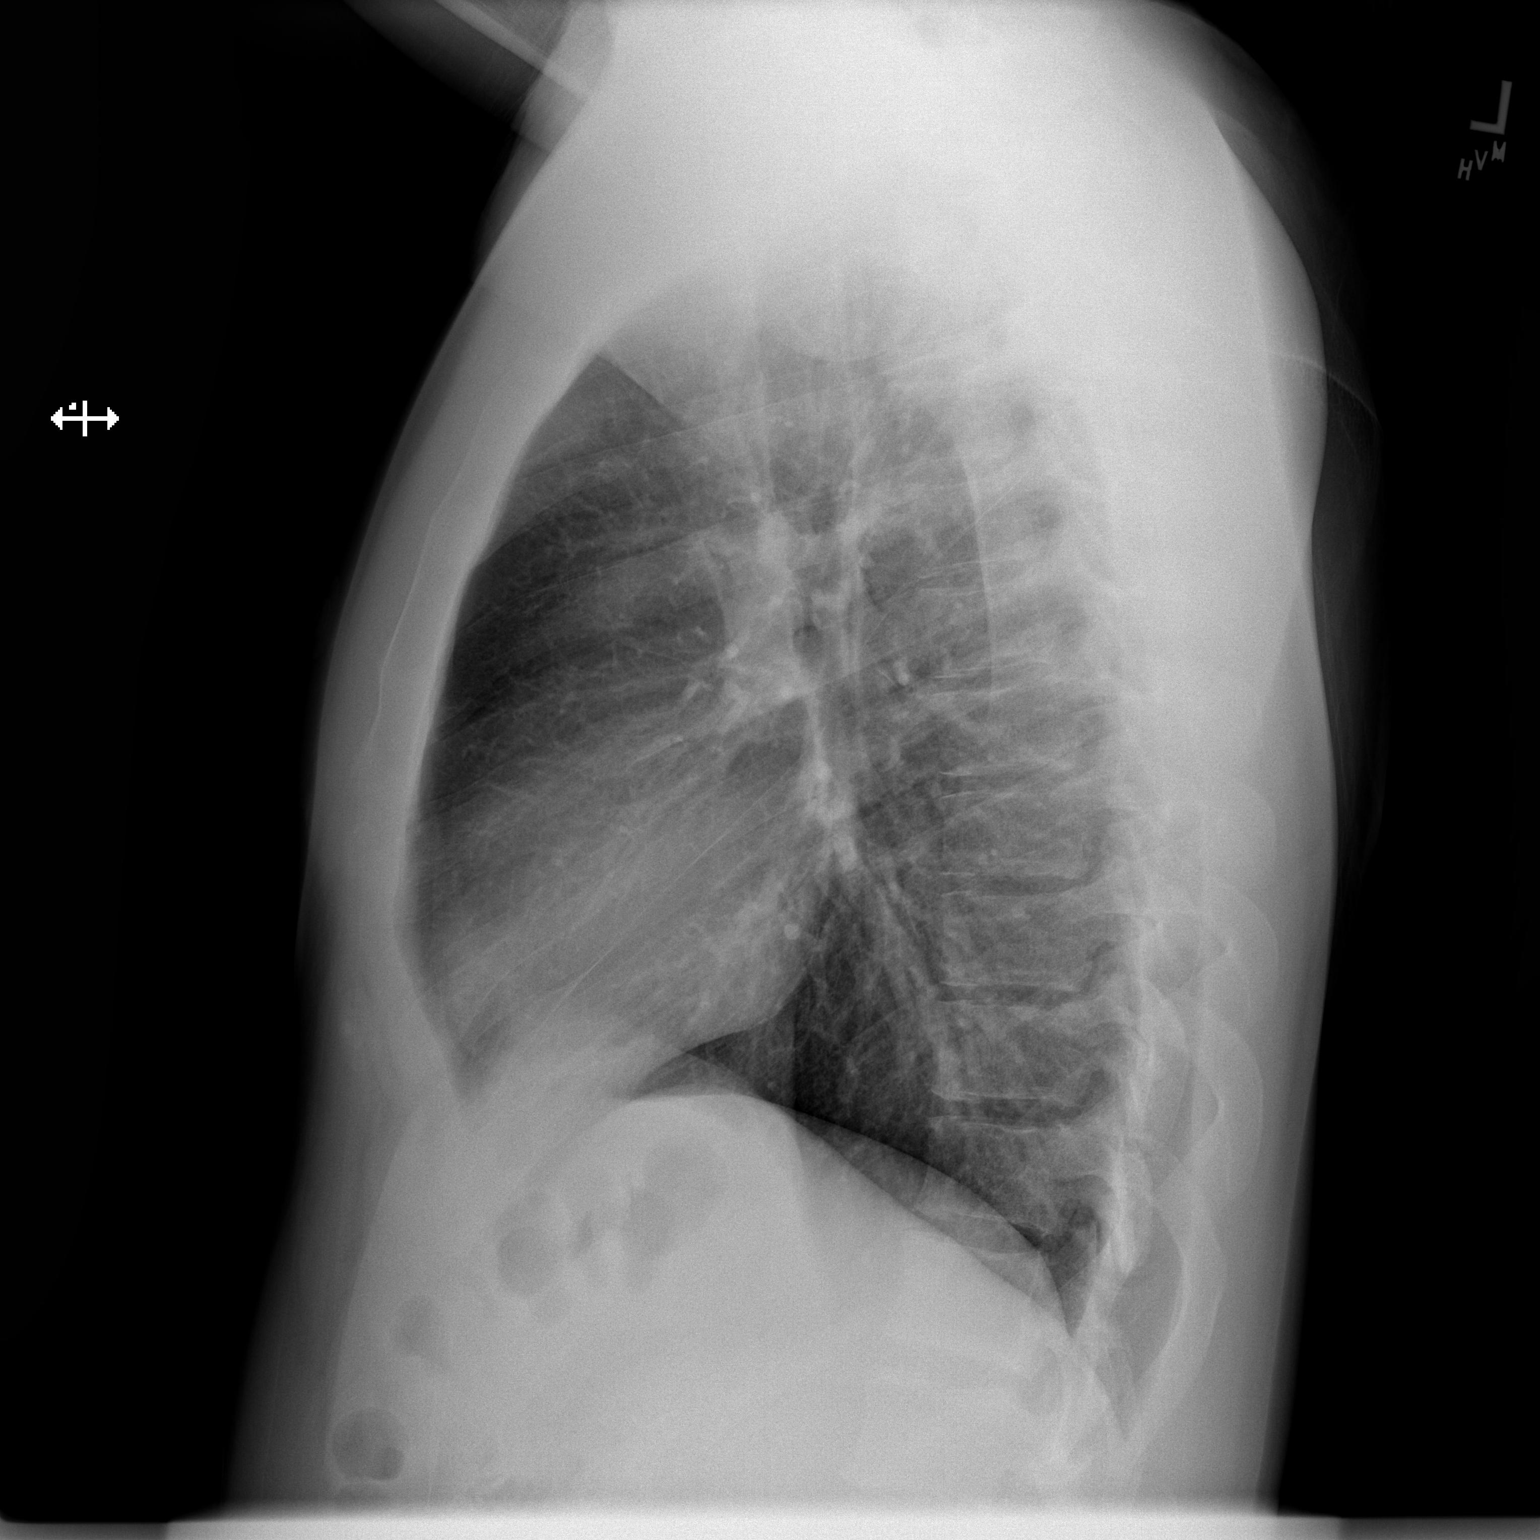

[2 of 2 positions shown; findings below may reference images not displayed]

FINDINGS: The heart size and mediastinal contours are within normal limits.
Both lungs are clear. The visualized skeletal structures are
unremarkable.
IMPRESSION: No active cardiopulmonary disease.

## 2013-07-09 MED ORDER — METHOCARBAMOL 750 MG PO TABS: 750.0000 mg | ORAL_TABLET | Freq: Four times a day (QID) | ORAL | Status: DC

## 2013-07-09 MED ORDER — SODIUM CHLORIDE 0.9 % IV SOLN
INTRAVENOUS | Status: DC
  Administered 2013-07-09: 17:00:00 via INTRAVENOUS

## 2013-07-09 MED ORDER — LISINOPRIL 10 MG PO TABS: 10.0000 mg | ORAL_TABLET | Freq: Every day | ORAL | Status: DC

## 2013-07-09 NOTE — ED Notes (Signed)
Pt c/o of chest pain and left arm numbness that started today. Denies dizziness or weakness. Pain 6/10 tightness.

## 2013-07-09 NOTE — ED Provider Notes (Signed)
CSN: [id number]     Arrival date & time 07/09/13  1444 History   First MD Initiated Contact with Patient 07/09/13 1537     Chief Complaint  Patient presents with  . Chest Pain  . Numbness   (Consider location/radiation/quality/duration/timing/severity/associated sxs/prior Treatment) Patient is a 27 y.o. male presenting with chest pain. The history is provided by the patient.  Chest Pain Pain location:  L chest Pain quality: stabbing   Pain radiates to:  Does not radiate Pain radiates to the back: no   Pain severity:  Mild Onset quality:  Sudden Timing:  Sporadic Progression:  Waxing and waning Chronicity:  New Context: movement and at rest   Context: not breathing   Relieved by:  Nothing Worsened by:  Nothing tried Ineffective treatments:  None tried Associated symptoms: no cough, no fever, no palpitations and no weakness     Past Medical History  Diagnosis Date  . Hypertension   . Dilated cardiomyopathy 05/18/2013    By catheterization 2014   . Abscess, perirectal, x2  05/18/2013   Past Surgical History  Procedure Laterality Date  . Mandible fracture surgery  2006   Family History  Problem Relation Age of Onset  . Diabetes Mother   . Hypertension Other    History  Substance Use Topics  . Smoking status: Current Every Day Smoker -- 1.00 packs/day    Types: Cigarettes  . Smokeless tobacco: Never Used  . Alcohol Use: Yes     Comment: occ    Review of Systems  Constitutional: Negative for fever.  Respiratory: Negative for cough.   Cardiovascular: Positive for chest pain. Negative for palpitations.  Neurological: Negative for weakness.  All other systems reviewed and are negative.  pain lasts for seconds, no associated diaphoresis or dyspnea, recent angio nl heart cath--no tx used pta and nothing makes the sx better  Allergies  Shrimp  Home Medications   Current Outpatient Rx  Name  Route  Sig  Dispense  Refill  . aspirin 81 MG tablet   Oral   Take 81  mg by mouth daily.         . cyclobenzaprine (FLEXERIL) 10 MG tablet   Oral   Take 1 tablet (10 mg total) by mouth 2 (two) times daily as needed for muscle spasms.   20 tablet   0   . lisinopril (ZESTRIL) 10 MG tablet   Oral   Take 1 tablet (10 mg total) by mouth daily.   30 tablet   1    BP 129/86  Pulse 91  Temp(Src) 98.5 F (36.9 C) (Oral)  Resp 12  Ht 5' 11"  (1.803 m)  Wt 185 lb (83.915 kg)  BMI 25.81 kg/m2  SpO2 96% Physical Exam  Nursing note and vitals reviewed. Constitutional: He is oriented to person, place, and time. He appears well-developed and well-nourished.  Non-toxic appearance. No distress.  HENT:  Head: Normocephalic and atraumatic.  Eyes: Conjunctivae, EOM and lids are normal. Pupils are equal, round, and reactive to light.  Neck: Normal range of motion. Neck supple. No tracheal deviation present. No mass present.  Cardiovascular: Normal rate, regular rhythm and normal heart sounds.  Exam reveals no gallop.   No murmur heard. Pulmonary/Chest: Effort normal and breath sounds normal. No stridor. No respiratory distress. He has no decreased breath sounds. He has no wheezes. He has no rhonchi. He has no rales.  Abdominal: Soft. Normal appearance and bowel sounds are normal. He exhibits no distension. There is  no tenderness. There is no rebound and no CVA tenderness.  Musculoskeletal: Normal range of motion. He exhibits no edema and no tenderness.  Neurological: He is alert and oriented to person, place, and time. He has normal strength. No cranial nerve deficit or sensory deficit. GCS eye subscore is 4. GCS verbal subscore is 5. GCS motor subscore is 6.  Skin: Skin is warm and dry. No abrasion and no rash noted.  Psychiatric: He has a normal mood and affect. His speech is normal and behavior is normal.    ED Course  Procedures (including critical care time) Labs Review Labs Reviewed  D-DIMER, QUANTITATIVE  TROPONIN I  CBC WITH DIFFERENTIAL  BASIC  METABOLIC PANEL   Imaging Review No results found.  EKG Interpretation    Date/Time:  Monday July 09 2013 14:54:38 EST Ventricular Rate:  97 PR Interval:  138 QRS Duration: 81 QT Interval:  335 QTC Calculation: 425 R Axis:   84 Text Interpretation:  Sinus rhythm Borderline T abnormalities, inferior leads Borderline ST elevation, anterior leads No significant change since last tracing            MDM  No diagnosis found. Pt with atypical chest pain x 3 days with recent cath showing no obstructive dz but ef of 50%, will check d-dimer and troponin  6:56 PM Patient's last x-rays reviewed. No concern for ACS or PE. Stable for d/c  Leota Jacobsen, MD 07/09/13 [phone]

## 2014-01-10 ENCOUNTER — Emergency Department (HOSPITAL_COMMUNITY): Payer: Self-pay

## 2014-01-10 ENCOUNTER — Encounter (HOSPITAL_COMMUNITY): Payer: Self-pay | Admitting: Emergency Medicine

## 2014-01-10 ENCOUNTER — Emergency Department (HOSPITAL_COMMUNITY)
Admission: EM | Admit: 2014-01-10 | Discharge: 2014-01-10 | Disposition: A | Discharge status: Home / Self Care | Payer: Self-pay | Attending: Emergency Medicine | Admitting: Emergency Medicine

## 2014-01-10 DIAGNOSIS — Z79899 Other long term (current) drug therapy: Secondary | ICD-10-CM | POA: Insufficient documentation

## 2014-01-10 DIAGNOSIS — Z8719 Personal history of other diseases of the digestive system: Secondary | ICD-10-CM | POA: Insufficient documentation

## 2014-01-10 DIAGNOSIS — R110 Nausea: Secondary | ICD-10-CM | POA: Insufficient documentation

## 2014-01-10 DIAGNOSIS — R079 Chest pain, unspecified: Secondary | ICD-10-CM

## 2014-01-10 DIAGNOSIS — R0789 Other chest pain: Secondary | ICD-10-CM | POA: Insufficient documentation

## 2014-01-10 DIAGNOSIS — Z7982 Long term (current) use of aspirin: Secondary | ICD-10-CM | POA: Insufficient documentation

## 2014-01-10 DIAGNOSIS — I10 Essential (primary) hypertension: Secondary | ICD-10-CM | POA: Insufficient documentation

## 2014-01-10 DIAGNOSIS — Z9889 Other specified postprocedural states: Secondary | ICD-10-CM | POA: Insufficient documentation

## 2014-01-10 DIAGNOSIS — R61 Generalized hyperhidrosis: Secondary | ICD-10-CM | POA: Insufficient documentation

## 2014-01-10 DIAGNOSIS — R0602 Shortness of breath: Secondary | ICD-10-CM | POA: Insufficient documentation

## 2014-01-10 DIAGNOSIS — F17200 Nicotine dependence, unspecified, uncomplicated: Secondary | ICD-10-CM | POA: Insufficient documentation

## 2014-01-10 LAB — COMPREHENSIVE METABOLIC PANEL
ALBUMIN: 3.4 g/dL — AB (ref 3.5–5.2)
ALK PHOS: 122 U/L — AB (ref 39–117)
ALT: 27 U/L (ref 0–53)
AST: 22 U/L (ref 0–37)
BILIRUBIN TOTAL: 0.4 mg/dL (ref 0.3–1.2)
BUN: 14 mg/dL (ref 6–23)
CHLORIDE: 104 meq/L (ref 96–112)
CO2: 22 mEq/L (ref 19–32)
Calcium: 8.5 mg/dL (ref 8.4–10.5)
Creatinine, Ser: 0.92 mg/dL (ref 0.50–1.35)
GFR calc Af Amer: >90 mL/min (ref >90)
GFR calc non Af Amer: >90 mL/min (ref >90)
Glucose, Bld: 102 mg/dL — ABNORMAL HIGH (ref 70–99)
POTASSIUM: 4 meq/L (ref 3.7–5.3)
SODIUM: 141 meq/L (ref 137–147)
Total Protein: 7.2 g/dL (ref 6.0–8.3)

## 2014-01-10 LAB — RAPID URINE DRUG SCREEN, HOSP PERFORMED
Amphetamines: NOT DETECTED
Barbiturates: NOT DETECTED
Benzodiazepines: NOT DETECTED
Cocaine: NOT DETECTED
Opiates: NOT DETECTED
Tetrahydrocannabinol: NOT DETECTED

## 2014-01-10 LAB — I-STAT TROPONIN, ED: Troponin i, poc: 0 ng/mL (ref 0.00–0.08)

## 2014-01-10 LAB — CBC
HCT: 36.8 % — ABNORMAL LOW (ref 39.0–52.0)
Hemoglobin: 12.8 g/dL — ABNORMAL LOW (ref 13.0–17.0)
MCH: 28.8 pg (ref 26.0–34.0)
MCHC: 34.8 g/dL (ref 30.0–36.0)
MCV: 82.9 fL (ref 78.0–100.0)
PLATELETS: 224 10*3/uL (ref 150–400)
RBC: 4.44 MIL/uL (ref 4.22–5.81)
RDW: 14 % (ref 11.5–15.5)
WBC: 6.8 10*3/uL (ref 4.0–10.5)

## 2014-01-10 LAB — URINALYSIS, ROUTINE W REFLEX MICROSCOPIC
Bilirubin Urine: NEGATIVE
Glucose, UA: NEGATIVE mg/dL
Hgb urine dipstick: NEGATIVE
Ketones, ur: NEGATIVE mg/dL
Leukocytes, UA: NEGATIVE
Nitrite: NEGATIVE
Protein, ur: NEGATIVE mg/dL
Specific Gravity, Urine: 1.01 (ref 1.005–1.030)
Urobilinogen, UA: 1 mg/dL (ref 0.0–1.0)
pH: 6.5 (ref 5.0–8.0)

## 2014-01-10 LAB — PRO B NATRIURETIC PEPTIDE: Pro B Natriuretic peptide (BNP): <5 pg/mL (ref 0–125)

## 2014-01-10 IMAGING — CR DG CHEST 2V
2 series · 2 of 2 positions shown · non-contrast
Comparison: Chest x-ray [DATE].

CLINICAL DATA: Mid chest pain for the past 2 days.

EXAM:
CHEST  2 VIEW

[w chest pa]
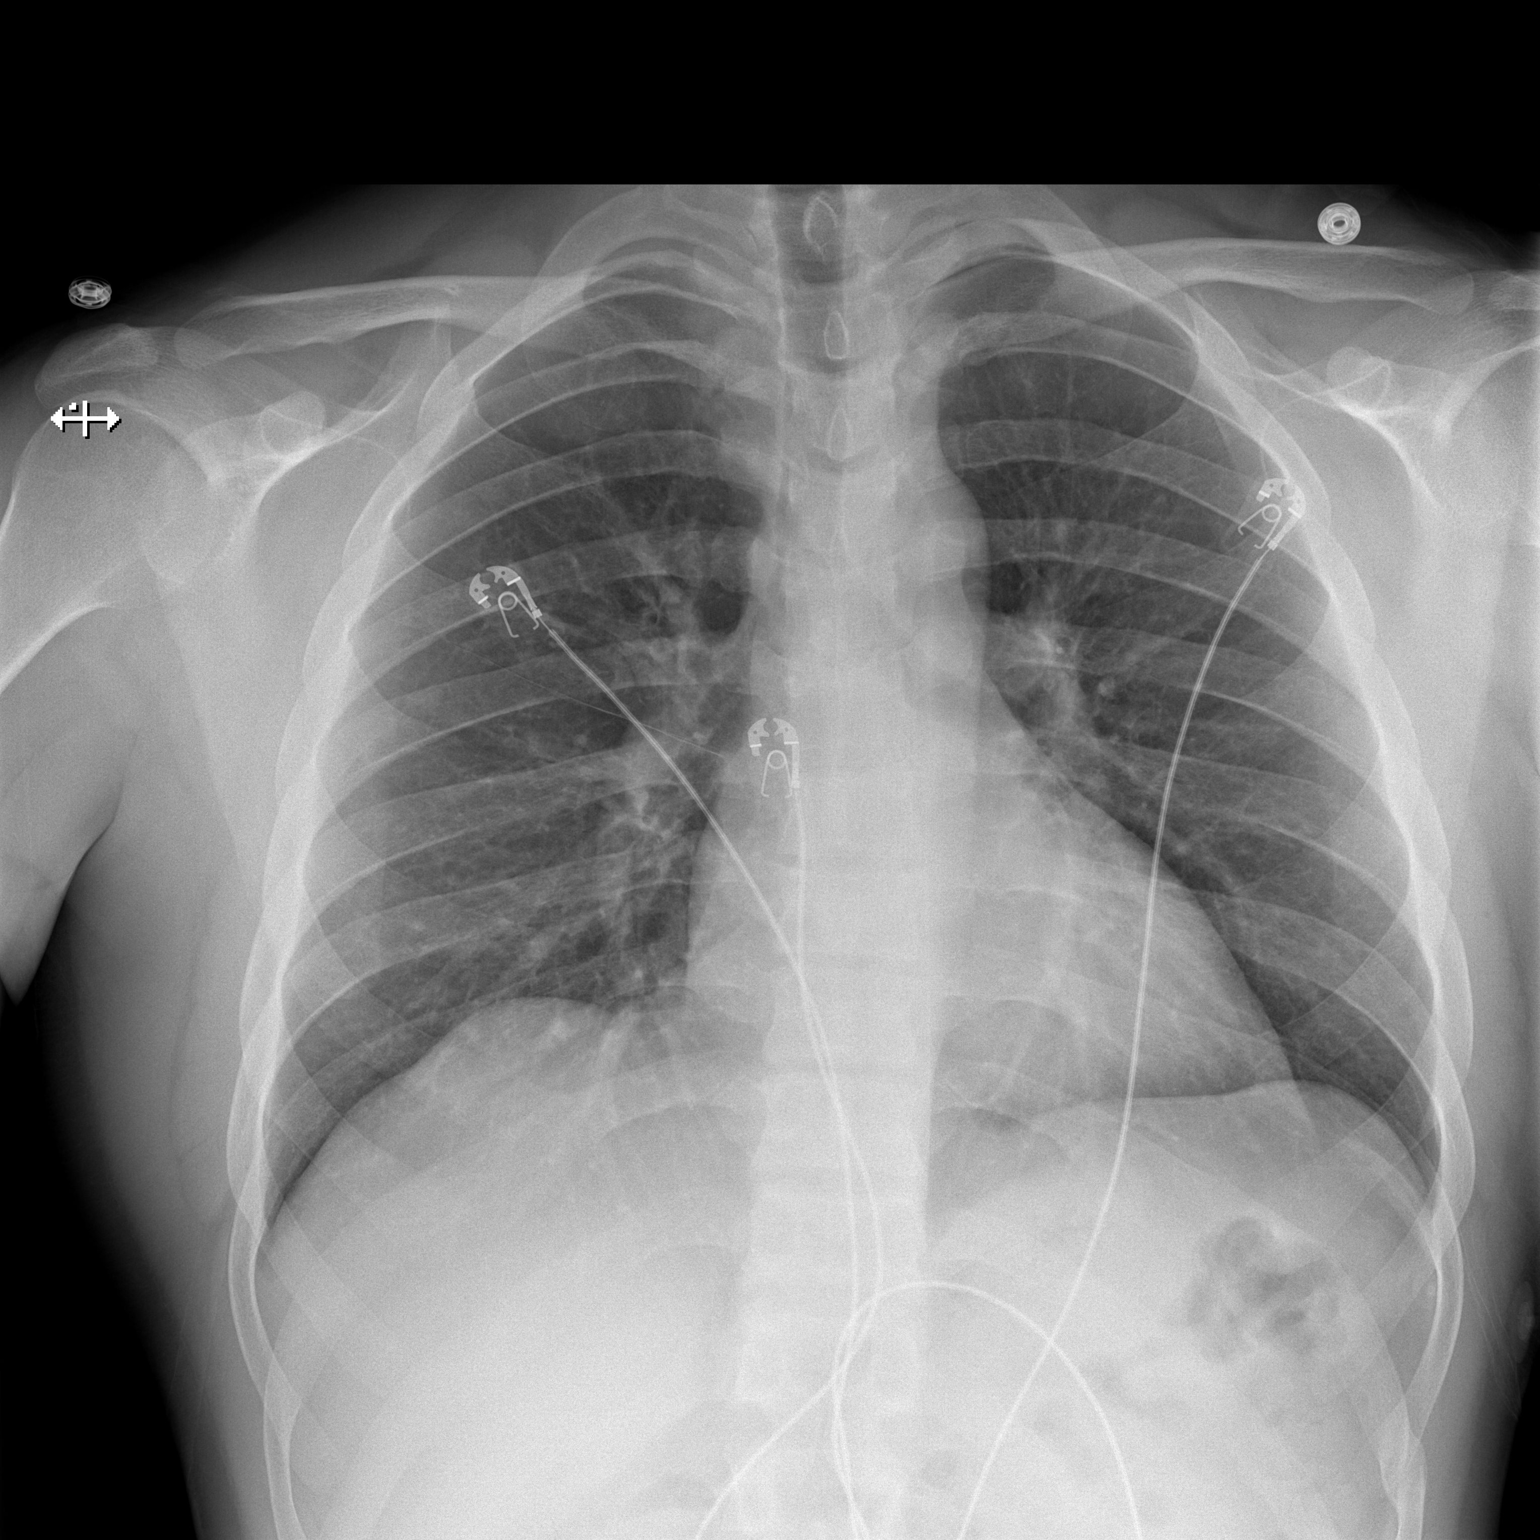

[w chest lat]
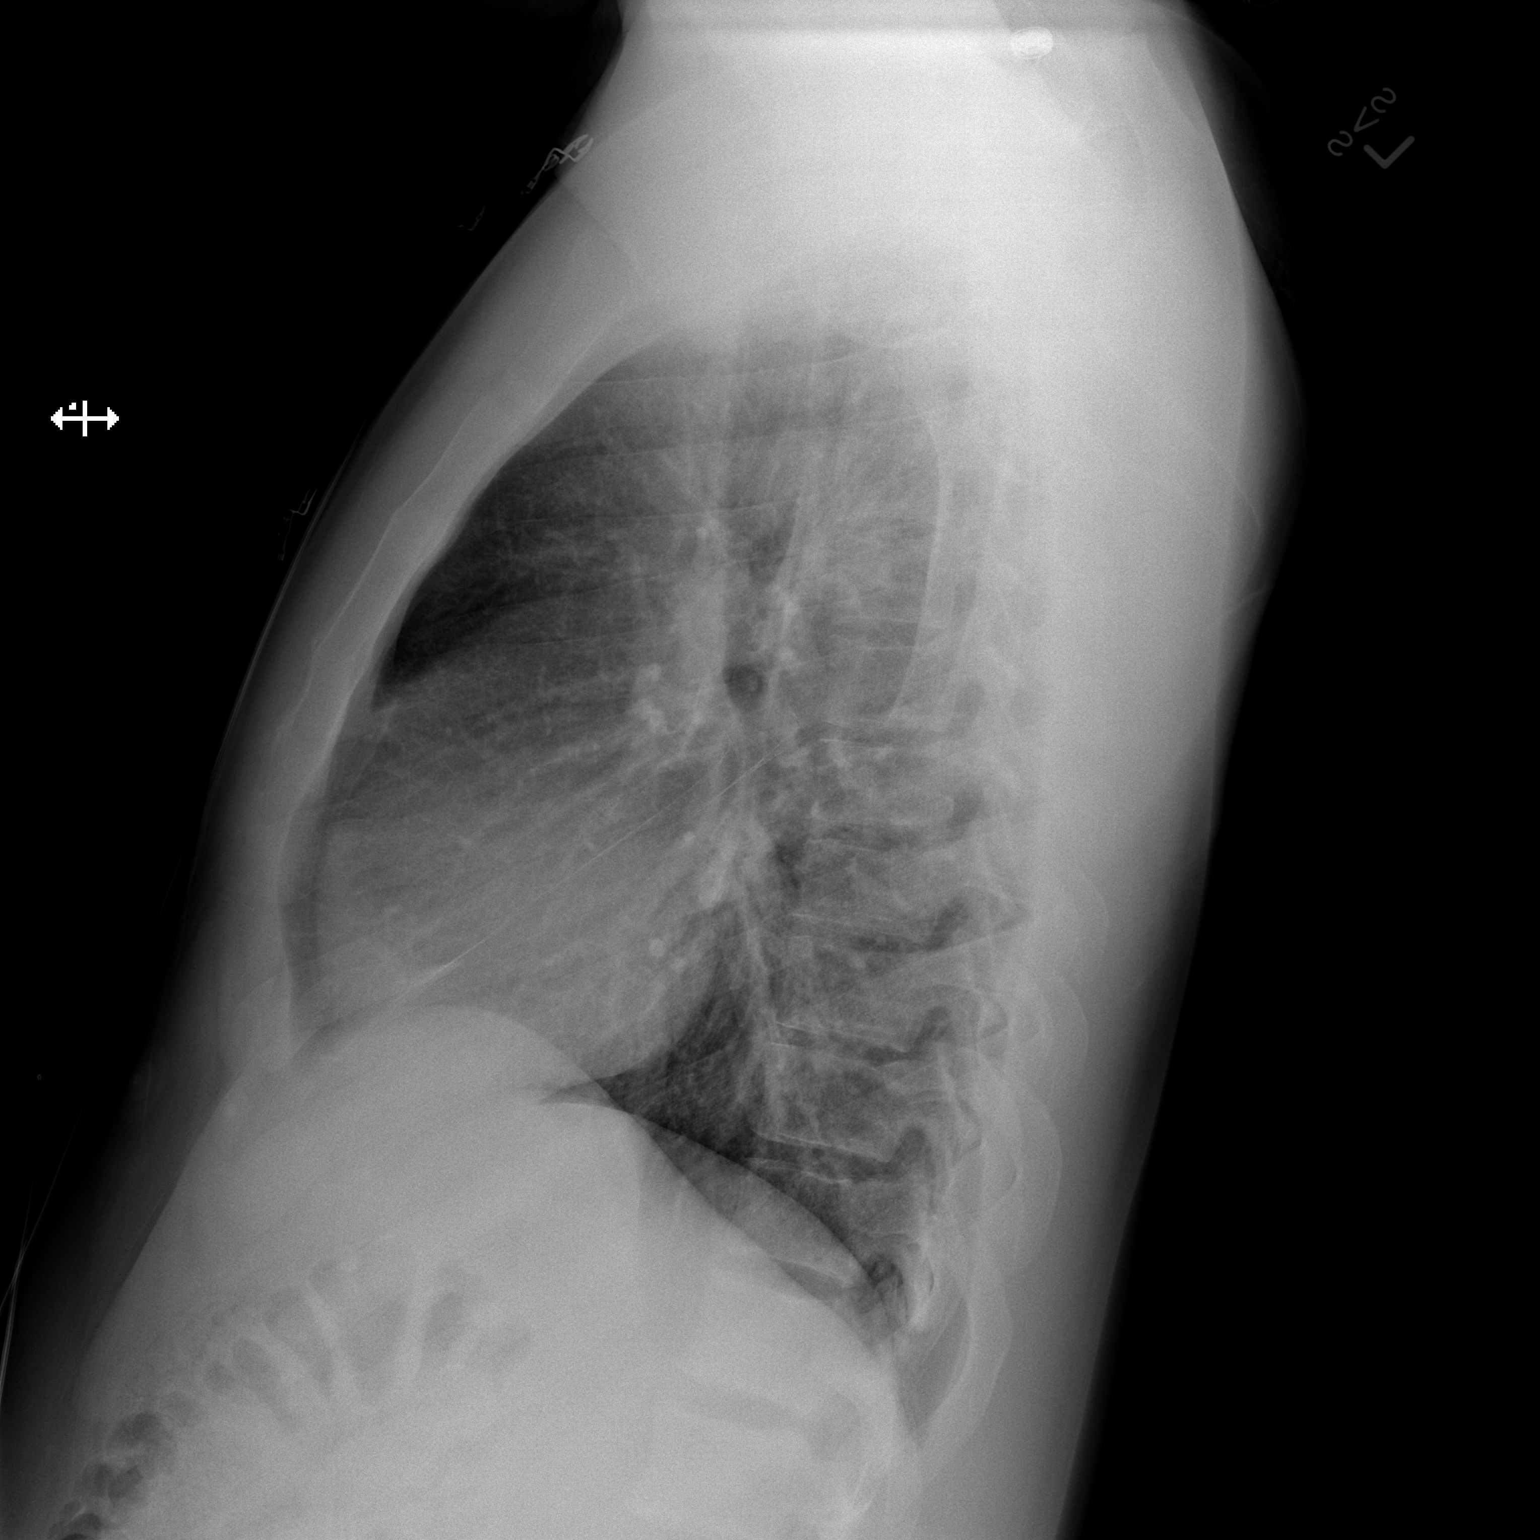

[2 of 2 positions shown; findings below may reference images not displayed]

FINDINGS: Lung volumes are normal. No consolidative airspace disease. No
pleural effusions. No pneumothorax. No pulmonary nodule or mass
noted. Pulmonary vasculature and the cardiomediastinal silhouette
are within normal limits.
IMPRESSION: No radiographic evidence of acute cardiopulmonary disease.

## 2014-01-10 MED ORDER — METOPROLOL TARTRATE 50 MG PO TABS: 25.0000 mg | ORAL_TABLET | Freq: Two times a day (BID) | ORAL | Status: DC

## 2014-01-10 MED ORDER — ASPIRIN 81 MG PO CHEW
324.0000 mg | CHEWABLE_TABLET | Freq: Once | ORAL | Status: AC
  Administered 2014-01-10: 324 mg via ORAL
  Filled 2014-01-10: qty 4

## 2014-01-10 MED ORDER — NITROGLYCERIN 0.4 MG SL SUBL
0.4000 mg | SUBLINGUAL_TABLET | SUBLINGUAL | Status: DC | PRN
  Administered 2014-01-10: 0.4 mg via SUBLINGUAL
  Filled 2014-01-10: qty 1

## 2014-01-10 MED ORDER — LISINOPRIL 10 MG PO TABS: 10.0000 mg | ORAL_TABLET | Freq: Every day | ORAL | Status: DC

## 2014-01-10 NOTE — Discharge Instructions (Signed)
1. Medications: usual home medications 2. Treatment: rest, drink plenty of fluids,  3. Follow Up: Please followup with your primary doctor for discussion of your diagnoses and further evaluation after today's visit; if you do not have a primary care doctor use the resource guide provided to find one;     Chest Pain Observation It is often hard to give a specific diagnosis for the cause of chest pain. Among other possibilities your symptoms might be caused by inadequate oxygen delivery to your heart (angina). Angina that is not treated or evaluated can lead to a heart attack (myocardial infarction) or death. Blood tests, electrocardiograms, and X-rays may have been done to help determine a possible cause of your chest pain. After evaluation and observation, your health care provider has determined that it is unlikely your pain was caused by an unstable condition that requires hospitalization. However, a full evaluation of your pain may need to be completed, with additional diagnostic testing as directed. It is very important to keep your follow-up appointments. Not keeping your follow-up appointments could result in permanent heart damage, disability, or death. If there is any problem keeping your follow-up appointments, you must call your health care provider. HOME CARE INSTRUCTIONS  Due to the slight chance that your pain could be angina, it is important to follow your health care provider's treatment plan and also maintain a healthy lifestyle:  Maintain or work toward achieving a healthy weight.  Stay physically active and exercise regularly.  Decrease your salt intake.  Eat a balanced, healthy diet. Talk to a dietician to learn about heart healthy foods.  Increase your fiber intake by including whole grains, vegetables, fruits, and nuts in your diet.  Avoid situations that cause stress, anger, or depression.  Take medicines as advised by your health care provider. Report any side effects  to your health care provider. Do not stop medicines or adjust the dosages on your own.  Quit smoking. Do not use nicotine patches or gum until you check with your health care provider.  Keep your blood pressure, blood sugar, and cholesterol levels within normal limits.  Limit alcohol intake to no more than 1 drink per day for women that are not pregnant and 2 drinks per day for men.  Do not abuse drugs. SEEK IMMEDIATE MEDICAL CARE IF: You have severe chest pain or pressure which may include symptoms such as:  You feel pain or pressure in you arms, neck, jaw, or back.  You have severe back or abdominal pain, feel sick to your stomach (nauseous), or throw up (vomit).  You are sweating profusely.  You are having a fast or irregular heartbeat.  You feel short of breath while at rest.  You notice increasing shortness of breath during rest, sleep, or with activity.  You have chest pain that does not get better after rest or after taking your usual medicine.  You wake from sleep with chest pain.  You are unable to sleep because you cannot breathe.  You develop a frequent cough or you are coughing up blood.  You feel dizzy, faint, or experience extreme fatigue.  You develop severe weakness, dizziness, fainting, or chills. Any of these symptoms may represent a serious problem that is an emergency. Do not wait to see if the symptoms will go away. Call your local emergency services (911 in the U.S.). Do not drive yourself to the hospital. MAKE SURE YOU:  Understand these instructions.  Will watch your condition.  Will get help right  away if you are not doing well or get worse. Document Released: 09/11/2010 Document Revised: 04/11/2013 Document Reviewed: 02/08/2013 [facility] Patient Information 2014 [place], [place].    Emergency Department Resource Guide 1) Find a Doctor and Pay Out of Pocket Although you won't have to find out who is covered by your insurance plan, it is a good  idea to ask around and get recommendations. You will then need to call the office and see if the doctor you have chosen will accept you as a new patient and what types of options they offer for patients who are self-pay. Some doctors offer discounts or will set up payment plans for their patients who do not have insurance, but you will need to ask so you aren't surprised when you get to your appointment.  2) Contact Your Local Health Department Not all health departments have doctors that can see patients for sick visits, but many do, so it is worth a call to see if yours does. If you don't know where your local health department is, you can check in your phone book. The CDC also has a tool to help you locate your state's health department, and many state websites also have listings of all of their local health departments.  3) Find a Walk-in Clinic If your illness is not likely to be very severe or complicated, you may want to try a walk in clinic. These are popping up all over the country in pharmacies, drugstores, and shopping centers. They're usually staffed by nurse practitioners or physician assistants that have been trained to treat common illnesses and complaints. They're usually fairly quick and inexpensive. However, if you have serious medical issues or chronic medical problems, these are probably not your best option.  No Primary Care Doctor: - Call Health Connect at  [phone] - they can help you locate a primary care doctor that  accepts your insurance, provides certain services, etc. - Physician Referral Service- [phone]  Chronic Pain Problems: Organization         Address  Phone   Notes  [facility]  [phone] Patients need to be referred by their primary care doctor.   Medication Assistance: Organization         Address  Phone   Notes  [facility] [address] [place] [place], [place] [address] [phone]  --Must be a resident of [facility] -- Must have NO insurance coverage whatsoever (no Medicaid/ Medicare, etc.) -- The pt. MUST have a primary care doctor that directs their care regularly and follows them in the community   [facility]  [phone]   [employer]  [phone]    Agencies that provide inexpensive medical care: Organization         Address  Phone   Notes  [facility]  [phone]   [facility]    [phone]   [facility] [address] [place], [place] [address] [phone]   [facility] [address] [place] [place] [phone]   Planned Parenthood    9305730260   [facility]    [phone]   [facility]  [address], [place] Phone:  [phone], Fax:  [phone] Hours of Operation:  9 am - 6 pm, M-F.  Also accepts Medicaid/Medicare and self-pay.  [facility]  [address], [place] Phone: [phone], Fax: [phone](336) [phone]. Hours of Operation:  8:30 am - 5:30 pm, M-F.  Also accepts Medicaid and self-pay.  [facility] [place] [address], [place] Phone: [phone](336) [phone]   [facility] [address], [place], [place] [phone], Ext. [phone] Mondays & Thursdays: 7-9 AM.  First 15 patients are seen on a first come, first serve basis.    Medicaid-accepting [place] Providers:  Organization         Address  Phone   Notes  [facility] [address], [place] [phone] Also accepts self-pay patients.  [facility] [address], [place]  [phone]) [phone]   [facility] [address], [place] [phone](336) [phone]   [facility] [address] [place] [address], [place] [phone](336) [phone]   Renaye RakersVeita Bland [address], [place]   [phone](336) [phone] Only  accepts [place] Access [place] patients after they have their name applied to their card.   Self-Pay (no insurance) in [facility]:  Organization         Address  Phone   Notes  Sickle Cell Patients, [facility] [address], [place] [phone]) [phone]   [facility] [address], [place] [phone]   [facility]  [address], [place] [phone]   [facility]/Dr. Osei-Bonsu  [address] [place] [address], [place] or [phone], [place] [phone](336) [phone] Phone number for both [place] and [place] locations is the same.  [facility] [address], [place] [phone]) [phone]   [facility]  [address] [place] [address], [place] or [address] [phone](336) [phone](336) [phone]   [facility] [address], [place] [phone](336) [phone], phone; [phone](336) [phone], fax Sees patients 1st and 3rd Saturday of every month.  Must not qualify for public or private insurance (i.e. Medicaid, Medicare, [place] Health Choice, Veterans' Benefits)  Household income should be no more than 200% of the poverty level The clinic cannot treat you if you are pregnant or think you are pregnant  Sexually transmitted diseases are not treated at the clinic.    Dental Care: Organization         Address  Phone  Notes  [facility] [address], [place] [phone]) [phone] Accepts children up to age 30 who are enrolled in [place] or [place] Health Choice; pregnant women with a Medicaid card; and children who have applied for Medicaid or [place] Health Choice, but were declined, whose parents can pay a reduced fee at time of service.  [facility] [place]  [address], [place] [phone] Accepts children up to age 30 who are enrolled in [place] or [place] Health Choice; pregnant  women with a Medicaid card; and children who have applied for Medicaid or [place] Health Choice, but were declined, whose parents can pay a reduced fee at time of service.  [facility]  [address], [place] [phone]) [phone] Patients are seen by appointment only. Walk-ins are not accepted. [facility] will see patients 30 years of age and older. Monday - Tuesday (8am-5pm) Most Wednesdays (8:30-5pm) $30 per visit, cash only  [facility]  [address], [facility] [phone] Patients are seen by appointment only. Walk-ins are not accepted. [facility] will see patients 36 years of age and older. One Wednesday Evening (Monthly: Volunteer Based).  $30 per visit, cash only  [employer]  [phone] for adults; Children under age 57, call Graduate Pediatric Dentistry at [phone]. Children aged 77-14, please call [phone] to request a pediatric application.  Dental services are provided in all areas of dental care including fillings, crowns and Cheeks, complete and partial dentures, implants, gum treatment, root canals, and extractions. Preventive care is also provided. Treatment is provided to both adults and children. Patients are selected via a lottery and there is often a waiting list.   [facility] [address] [place]  [phone] [email or web address]   [facility] [address] [place], [place] [phone], Ext. [phone] Second and Fourth Thursday of each month, opens at 6:30 AM; Clinic ends at 9 AM.  Patients are seen on a first-come first-served basis, and a limited number are seen during each clinic.   [facility]  [address] [place], [place] [phone]   Eligibility Requirements You must have lived in [place], [place], or [place] counties for at least the last three months.   You cannot be eligible for state or federal sponsored  [place], including [employer], [place], or [employer].   You generally cannot be eligible for healthcare insurance through your employer.    How to apply: Eligibility screenings are held every Tuesday and Wednesday afternoon from 1:00 pm until 4:00 pm. You do not need an appointment for the interview!  [facility] [address] [place], [place] [phone]   [facility] Health Department  [phone]   [facility] Health Department  [phone]   [facility] Health Department  [phone]    Behavioral Health Resources in the Community: Intensive Outpatient Programs Organization         Address  Phone  Notes  [facility] [address] [place], [place] [phone]   [facility] [address] [place], [place] [phone]   [facility] [address], [place], [place]  [phone]   [facility] [address]  [place], [place] [phone] or [phone]   Substance Abuse Resources Organization         Address  Phone  Notes  [facility]  [phone]   [facility]  [phone]   [facility] [place]  [phone]   Floydene Flock  [phone]   Residential & Outpatient Substance Abuse Program  [phone]   Psychological Services Organization         Address  Phone  Notes  [facility]  [phone]   [facility]  [phone]   [facility] [address], [place] [phone] or [phone]    Mobile Crisis Teams Organization         Address  Phone  Notes  [facility], Mobile Crisis Care Unit  [phone]   [facility]  [address] [place], [place] [phone]   Doristine Locks [address] [place] [phone]    Self-Help/Support Groups Organization         Address  Phone              Notes  [facility] [place] -  variety of support groups  336- [phone] Call for more information  Narcotics Anonymous (NA), [facility] [address], [employer] [place]  2 meetings at this location   Residential Sports administrator         Address  Phone  Notes  [facility] [address],    [place]  [phone]   [facility]  [address], [place] [address], [place], [place] [phone]   [facility] [address] [place], [place] [phone] Admissions: 8am-3pm M-F  [facility] [address],    [place], [place] [phone]   [facility] [address] [place], [place], [place] [phone]   [facility] [address] [place] [address],  [place], [place] [phone]   [facility] - Intensive Outpatient [address], [place], [place] [phone]   [facility] ([facility] [address] [place]  [place], [place] [phone] or [phone]   [facility] ([facility]) [address] [place], [place] [phone] Accepts Medicaid  [facility] [place] [address],  [place] [phone] Substance Abuse/Addiction Treatment   [facility] Organization         Address  Phone  Notes  [facility]  [phone]   Angie Fava, PhD [address] [place], [place]   5633924272 or [phone]   [facility]   [address] [place], [place] [phone]   [facility] [address] [place], [place] [phone] Insurance/Medicaid/sponsorship through [facility] and Families [address]                                    [place], [place] [phone] Therapy/tele-psych/case  [facility] [address], [place] [phone]    Dr. Lolly Mustache  [phone]   [facility]  [employer] [facility] [place] Dept. 1) [address] [place] 2) [address], [place] 3)  [address], [place] [phone]  [phone]   [facility] Child Abuse Hotline 531-773-0442 or [phone] (After Hours)

## 2014-01-10 NOTE — ED Notes (Signed)
Pt reports generalized cp which started today, denies radiation or any SOB, nausea or diaphoresis at this time.  Pt reports he stopped taking his BP med lisinopril "a long time ago."  Pt also reports feeling "hot" and h/a/  Pt is A&OX4, no obvious neuro deficits noted at this time.

## 2014-01-10 NOTE — ED Notes (Signed)
Pt reports have chest tightness for the past few days, states it is mainly to his L chest but also his R. Reports feeling ShOB with a cough and feels flushed, denies n/v/d. Pt states he does smoke and has a hx of HTN. Pt states that he ran out of his HTN medications "some time ago" and has been unable to go his PCP to have these refilled. Pt a&o x4, skin warm and dry.

## 2014-01-10 NOTE — ED Provider Notes (Signed)
CSN: [id number]     Arrival date & time 01/10/14  1953 History   First MD Initiated Contact with Patient 01/10/14 2013     Chief Complaint  Patient presents with  . Chest Pain  . Feeling Flushed      (Consider location/radiation/quality/duration/timing/severity/associated sxs/prior Treatment) The history is provided by the patient and medical records. No language interpreter was used.    Shawn [name] is a 28 y.o. male  with a hx of HTN, dilated cardiomyopathy (no f/u with cardiology) presents to the Emergency Department complaining of gradual, persistent, progressively worsening chest tightness onset 2-3 days ago; waxing and waning but never resolving. Associated symptoms include "feeling hot, " diaphoresis, nausea, SOB.  Pt reports that the 2 symptoms come together.  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   Pt reports this has happened before and he was admitted for cardiac work-up.  Nothing makes it better and nothing makes it worse.  Pt reports that he does smoke approx 1ppd.  Pt reports no HTN medications for several months because he ran out and couldn't see the doctor.  Pt denies fever, chills, headache, neck pain, abd pain, vomiting, diarrhea, weakness, dizziness, syncope, dysuria.        Past Medical History  Diagnosis Date  . Hypertension   . Dilated cardiomyopathy 05/18/2013    By catheterization 2014   . Abscess, perirectal, x2  05/18/2013   Past Surgical History  Procedure Laterality Date  . Mandible fracture surgery  2006   Family History  Problem Relation Age of Onset  . Diabetes Mother   . Hypertension Other    History  Substance Use Topics  . Smoking status: Current Every Day Smoker -- 1.00 packs/day    Types: Cigarettes  . Smokeless tobacco: Never Used  . Alcohol Use: Yes     Comment: occ    Review of Systems  Constitutional: Positive for diaphoresis. Negative for fever, appetite  change, fatigue and unexpected weight change.       "feeling hot"  HENT: Negative for mouth sores.   Eyes: Negative for visual disturbance.  Respiratory: Positive for shortness of breath. Negative for cough, chest tightness and wheezing.   Cardiovascular: Positive for chest pain.  Gastrointestinal: Positive for nausea. Negative for vomiting, abdominal pain, diarrhea and constipation.  Endocrine: Negative for polydipsia, polyphagia and polyuria.  Genitourinary: Negative for dysuria, urgency, frequency and hematuria.  Musculoskeletal: Negative for back pain and neck stiffness.  Skin: Negative for rash.  Allergic/Immunologic: Negative for immunocompromised state.  Neurological: Negative for syncope, light-headedness and headaches.  Hematological: Does not bruise/bleed easily.  Psychiatric/Behavioral: Negative for sleep disturbance. The patient is not nervous/anxious.       Allergies  Shrimp  Home Medications   Prior to Admission medications   Medication Sig Start Date End Date Taking? Authorizing Provider  aspirin 81 MG tablet Take 81 mg by mouth daily.    Historical Provider, MD  cyclobenzaprine (FLEXERIL) 10 MG tablet Take 1 tablet (10 mg total) by mouth 2 (two) times daily as needed for muscle spasms. 06/11/13   Shari A Upstill, PA-C  lisinopril (ZESTRIL) 10 MG tablet Take 1 tablet (10 mg total) by mouth daily. 07/09/13   Toy BakerAnthony T Allen, MD  methocarbamol (ROBAXIN-750) 750 MG tablet Take 1 tablet (750 mg total) by mouth 4 (four) times daily. 07/09/13   Toy BakerAnthony T Allen, MD   BP 158/99  Pulse 58  Temp(Src) 98.2  F (36.8 C) (Oral)  Resp 18  Ht 5' 11.5" (1.816 m)  Wt 204 lb 3.2 oz (92.625 kg)  BMI 28.09 kg/m2  SpO2 100% Physical Exam  Nursing note and vitals reviewed. Constitutional: He is oriented to person, place, and time. He appears well-developed and well-nourished. No distress.  Awake, alert, nontoxic appearance  HENT:  Head: Normocephalic and atraumatic.   Mouth/Throat: Oropharynx is clear and moist. No oropharyngeal exudate.  Eyes: Conjunctivae are normal. No scleral icterus.  Neck: Normal range of motion. Neck supple.  Cardiovascular: Normal rate, regular rhythm, normal heart sounds and intact distal pulses.   No murmur heard. Pulmonary/Chest: Effort normal and breath sounds normal. No respiratory distress. He has no wheezes. He has no rales.  Clear and equal breath sounds No pain to palpation of the chest  Abdominal: Soft. Bowel sounds are normal. He exhibits no distension and no mass. There is no tenderness. There is no rebound and no guarding.  Soft and nontender  Musculoskeletal: Normal range of motion. He exhibits no edema.  Lymphadenopathy:    He has no cervical adenopathy.  Neurological: He is alert and oriented to person, place, and time. He exhibits normal muscle tone. Coordination normal.  Speech is clear and goal oriented Moves extremities without ataxia  Skin: Skin is warm and dry. He is not diaphoretic. No erythema.  No rash  Psychiatric: He has a normal mood and affect. His behavior is normal.    ED Course  Procedures (including critical care time) Labs Review Labs Reviewed  CBC - Abnormal; Notable for the following:    Hemoglobin 12.8 (*)    HCT 36.8 (*)    All other components within normal limits  COMPREHENSIVE METABOLIC PANEL - Abnormal; Notable for the following:    Glucose, Bld 102 (*)    Albumin 3.4 (*)    Alkaline Phosphatase 122 (*)    All other components within normal limits  URINALYSIS, ROUTINE W REFLEX MICROSCOPIC  URINE RAPID DRUG SCREEN (HOSP PERFORMED)  PRO B NATRIURETIC PEPTIDE  I-STAT TROPOININ, ED    Imaging Review Dg Chest 2 View  01/10/2014   CLINICAL DATA:  Mid chest pain for the past 2 days.  EXAM: CHEST  2 VIEW  COMPARISON:  Chest x-ray 07/09/2013.  FINDINGS: Lung volumes are normal. No consolidative airspace disease. No pleural effusions. No pneumothorax. No pulmonary nodule or mass  noted. Pulmonary vasculature and the cardiomediastinal silhouette are within normal limits.  IMPRESSION: No radiographic evidence of acute cardiopulmonary disease.   Electronically Signed   By: Trudie Reed M.D.   On: 01/10/2014 21:36     EKG Interpretation   Date/Time:  Thursday Jan 10 2014 20:06:19 EDT Ventricular Rate:  61 PR Interval:  150 QRS Duration: 88 QT Interval:  395 QTC Calculation: 398 R Axis:   73 Text Interpretation:  Sinus rhythm Borderline T wave abnormalities  improved t wave changes in the inerior leads and lateral compared to prior  ECG Confirmed by KNAPP  MD-J, JON ([id number]) on 01/10/2014 10:52:02 PM      MDM   Final diagnoses:  Chest pain   Colburn Asper presents with chest pain, SOB, diaphoresis, nausea (without vomiting).  Record review shows that pt was seen for similar symptoms on march 2014 and admitted for cardiac work-up because ECG had abnormalities.  NO cardiac cath at that time and echo showed diffused left hypokinesis.  Pt with a hx of dilated cardiomyopathy.    Patient noted to be hypertensive in  the emergency department. He has not been taking his medications as directed.  No signs of hypertensive urgency or end organ damage on PE or labwork.    11:38 PM Pt with resolved chest pain.  Work-up unremarkable.  ECG nonischemic and improved from last.  Atypical chest pain.  Patient hypertension improved slightly.  Patient is to be discharged with recommendation to follow up with PCP in regards to today's hospital visit. Chest pain is not likely of cardiac or pulmonary etiology d/t presentation, PERC negative, VSS, no tracheal deviation, no JVD or new murmur, RRR, breath sounds equal bilaterally, EKG without acute abnormalities, negative troponin, and negative CXR. Pt has been advised to return to the ED if CP becomes exertional, associated with diaphoresis or nausea, radiates to left jaw/arm, worsens or becomes concerning in any way. Pt is to follow-up  with Dr. Algie CofferKadakia for further evaluation of his chest pain today.   I will refill his hypertension medications here in the emergency department tonight however he must make an appointment with [facility] for further evaluation and treatment of his hypertension.   If he is unable to followup with Dr.  Algie CofferKadakia, he may make an appointment with Dr. wall at the [facility].  Pt appears reliable for follow up and is agreeable to discharge.   Case has been discussed with Dr. Linwood DibblesJon Knapp who agrees with the above plan to discharge.   BP 158/99  Pulse 58  Temp(Src) 98.2 F (36.8 C) (Oral)  Resp 18  Ht 5' 11.5" (1.816 m)  Wt 204 lb 3.2 oz (92.625 kg)  BMI 28.09 kg/m2  SpO2 100%    Dierdre ForthHannah Stephano Arrants, PA-C 01/10/14 [id number]

## 2014-01-14 NOTE — ED Provider Notes (Signed)
Medical screening examination/treatment/procedure(s) were performed by non-physician practitioner and as supervising physician I was immediately available for consultation/collaboration.   EKG Interpretation   Date/Time:  Thursday Jan 10 2014 20:06:19 EDT Ventricular Rate:  61 PR Interval:  150 QRS Duration: 88 QT Interval:  395 QTC Calculation: 398 R Axis:   73 Text Interpretation:  Sinus rhythm Borderline T wave abnormalities  improved t wave changes in the inerior leads and lateral compared to prior  ECG Confirmed by Saamiya Jeppsen  MD-J, Myliyah Rebuck ([id number]) on 01/10/2014 10:52:02 PM        Dorie Rank, MD 01/14/14 [phone]

## 2014-01-31 ENCOUNTER — Ambulatory Visit: Payer: Self-pay | Attending: Internal Medicine

## 2014-08-01 ENCOUNTER — Encounter (HOSPITAL_COMMUNITY): Payer: Self-pay | Admitting: Cardiovascular Disease

## 2015-01-26 ENCOUNTER — Emergency Department (HOSPITAL_COMMUNITY)
Admission: EM | Admit: 2015-01-26 | Discharge: 2015-01-26 | Discharge status: Against Medical Advice | Payer: Self-pay | Attending: Emergency Medicine | Admitting: Emergency Medicine

## 2015-01-26 ENCOUNTER — Encounter (HOSPITAL_COMMUNITY): Payer: Self-pay | Admitting: *Deleted

## 2015-01-26 DIAGNOSIS — Z720 Tobacco use: Secondary | ICD-10-CM | POA: Insufficient documentation

## 2015-01-26 DIAGNOSIS — I10 Essential (primary) hypertension: Secondary | ICD-10-CM | POA: Insufficient documentation
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
## 2015-01-26 NOTE — ED Notes (Signed)
Called to take to treatment room without response from lobby 

## 2015-01-26 NOTE — ED Notes (Signed)
Pt called no response

## 2015-01-26 NOTE — ED Notes (Signed)
Called for third time  No response 

## 2015-01-26 NOTE — ED Notes (Signed)
1x call no answer back to room

## 2015-11-16 ENCOUNTER — Emergency Department (HOSPITAL_COMMUNITY)
Admission: EM | Admit: 2015-11-16 | Discharge: 2015-11-17 | Disposition: A | Discharge status: Home / Self Care | Payer: No Typology Code available for payment source | Attending: Physician Assistant | Admitting: Physician Assistant

## 2015-11-16 ENCOUNTER — Encounter (HOSPITAL_COMMUNITY): Payer: Self-pay | Admitting: Emergency Medicine

## 2015-11-16 DIAGNOSIS — Y998 Other external cause status: Secondary | ICD-10-CM | POA: Diagnosis not present

## 2015-11-16 DIAGNOSIS — S299XXA Unspecified injury of thorax, initial encounter: Secondary | ICD-10-CM | POA: Insufficient documentation

## 2015-11-16 DIAGNOSIS — F17210 Nicotine dependence, cigarettes, uncomplicated: Secondary | ICD-10-CM | POA: Insufficient documentation

## 2015-11-16 DIAGNOSIS — Z79899 Other long term (current) drug therapy: Secondary | ICD-10-CM | POA: Insufficient documentation

## 2015-11-16 DIAGNOSIS — I10 Essential (primary) hypertension: Secondary | ICD-10-CM | POA: Insufficient documentation

## 2015-11-16 DIAGNOSIS — M549 Dorsalgia, unspecified: Secondary | ICD-10-CM

## 2015-11-16 DIAGNOSIS — S199XXA Unspecified injury of neck, initial encounter: Secondary | ICD-10-CM | POA: Diagnosis present

## 2015-11-16 DIAGNOSIS — Y9389 Activity, other specified: Secondary | ICD-10-CM | POA: Insufficient documentation

## 2015-11-16 DIAGNOSIS — Y92410 Unspecified street and highway as the place of occurrence of the external cause: Secondary | ICD-10-CM | POA: Insufficient documentation

## 2015-11-16 DIAGNOSIS — S161XXA Strain of muscle, fascia and tendon at neck level, initial encounter: Secondary | ICD-10-CM | POA: Insufficient documentation
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
## 2015-11-16 NOTE — ED Notes (Signed)
See EDP assessment 

## 2015-11-17 MED ORDER — CYCLOBENZAPRINE HCL 10 MG PO TABS: 10.0000 mg | ORAL_TABLET | Freq: Two times a day (BID) | ORAL | Status: DC | PRN

## 2015-11-17 MED ORDER — IBUPROFEN 800 MG PO TABS: 800.0000 mg | ORAL_TABLET | Freq: Three times a day (TID) | ORAL | Status: DC

## 2015-11-17 MED ORDER — TRAMADOL HCL 50 MG PO TABS: 50.0000 mg | ORAL_TABLET | Freq: Three times a day (TID) | ORAL | Status: DC | PRN

## 2015-11-17 MED ORDER — IBUPROFEN 400 MG PO TABS
800.0000 mg | ORAL_TABLET | Freq: Once | ORAL | Status: AC
  Administered 2015-11-17: 800 mg via ORAL
  Filled 2015-11-17: qty 2
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
## 2016-03-01 ENCOUNTER — Encounter (HOSPITAL_COMMUNITY): Payer: Self-pay | Admitting: Emergency Medicine

## 2016-03-01 ENCOUNTER — Emergency Department (HOSPITAL_COMMUNITY)
Admission: EM | Admit: 2016-03-01 | Discharge: 2016-03-01 | Disposition: A | Discharge status: Home / Self Care | Payer: No Typology Code available for payment source | Attending: Emergency Medicine | Admitting: Emergency Medicine

## 2016-03-01 DIAGNOSIS — Z79899 Other long term (current) drug therapy: Secondary | ICD-10-CM | POA: Insufficient documentation

## 2016-03-01 DIAGNOSIS — I10 Essential (primary) hypertension: Secondary | ICD-10-CM | POA: Insufficient documentation

## 2016-03-01 DIAGNOSIS — Y999 Unspecified external cause status: Secondary | ICD-10-CM | POA: Insufficient documentation

## 2016-03-01 DIAGNOSIS — S39012A Strain of muscle, fascia and tendon of lower back, initial encounter: Secondary | ICD-10-CM | POA: Insufficient documentation

## 2016-03-01 DIAGNOSIS — Z7982 Long term (current) use of aspirin: Secondary | ICD-10-CM | POA: Insufficient documentation

## 2016-03-01 DIAGNOSIS — T148XXA Other injury of unspecified body region, initial encounter: Secondary | ICD-10-CM

## 2016-03-01 DIAGNOSIS — Y92410 Unspecified street and highway as the place of occurrence of the external cause: Secondary | ICD-10-CM | POA: Insufficient documentation

## 2016-03-01 DIAGNOSIS — Y939 Activity, unspecified: Secondary | ICD-10-CM | POA: Insufficient documentation

## 2016-03-01 DIAGNOSIS — F17210 Nicotine dependence, cigarettes, uncomplicated: Secondary | ICD-10-CM | POA: Insufficient documentation

## 2016-03-01 MED ORDER — NAPROXEN 375 MG PO TABS: 375.0000 mg | ORAL_TABLET | Freq: Two times a day (BID) | ORAL | Status: DC

## 2016-03-01 NOTE — ED Notes (Signed)
Declined W/C at D/C and was escorted to lobby by RN. 
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
## 2016-03-01 NOTE — Discharge Instructions (Signed)
Hypertension Hypertension, commonly called high blood pressure, is when the force of blood pumping through your arteries is too strong. Your arteries are the blood vessels that carry blood from your heart throughout your body. A blood pressure reading consists of a higher number over a lower number, such as 110/72. The higher number (systolic) is the pressure inside your arteries when your heart pumps. The lower number (diastolic) is the pressure inside your arteries when your heart relaxes. Ideally you want your blood pressure below 120/80. Hypertension forces your heart to work harder to pump blood. Your arteries may become narrow or stiff. Having untreated or uncontrolled hypertension can cause heart attack, stroke, kidney disease, and other problems. RISK FACTORS Some risk factors for high blood pressure are controllable. Others are not.  Risk factors you cannot control include:   Race. You may be at higher risk if you are African American.  Age. Risk increases with age.  Gender. Men are at higher risk than women before age 45 years. After age 65, women are at higher risk than men. Risk factors you can control include:  Not getting enough exercise or physical activity.  Being overweight.  Getting too much fat, sugar, calories, or salt in your diet.  Drinking too much alcohol. SIGNS AND SYMPTOMS Hypertension does not usually cause signs or symptoms. Extremely high blood pressure (hypertensive crisis) may cause headache, anxiety, shortness of breath, and nosebleed. DIAGNOSIS To check if you have hypertension, your health care provider will measure your blood pressure while you are seated, with your arm held at the level of your heart. It should be measured at least twice using the same arm. Certain conditions can cause a difference in blood pressure between your right and left arms. A blood pressure reading that is higher than normal on one occasion does not mean that you need treatment. If  it is not clear whether you have high blood pressure, you may be asked to return on a different day to have your blood pressure checked again. Or, you may be asked to monitor your blood pressure at home for 1 or more weeks. TREATMENT Treating high blood pressure includes making lifestyle changes and possibly taking medicine. Living a healthy lifestyle can help lower high blood pressure. You may need to change some of your habits. Lifestyle changes may include:  Following the DASH diet. This diet is high in fruits, vegetables, and whole grains. It is low in salt, red meat, and added sugars.  Keep your sodium intake below 2,300 mg per day.  Getting at least 30-45 minutes of aerobic exercise at least 4 times per week.  Losing weight if necessary.  Not smoking.  Limiting alcoholic beverages.  Learning ways to reduce stress. Your health care provider may prescribe medicine if lifestyle changes are not enough to get your blood pressure under control, and if one of the following is true:  You are 18-59 years of age and your systolic blood pressure is above 140.  You are 60 years of age or older, and your systolic blood pressure is above 150.  Your diastolic blood pressure is above 90.  You have diabetes, and your systolic blood pressure is over 140 or your diastolic blood pressure is over 90.  You have kidney disease and your blood pressure is above 140/90.  You have heart disease and your blood pressure is above 140/90. Your personal target blood pressure may vary depending on your medical conditions, your age, and other factors. HOME CARE INSTRUCTIONS    Have your blood pressure rechecked as directed by your health care provider.   Take medicines only as directed by your health care provider. Follow the directions carefully. Blood pressure medicines must be taken as prescribed. The medicine does not work as well when you skip doses. Skipping doses also puts you at risk for  problems.  Do not smoke.   Monitor your blood pressure at home as directed by your health care provider. SEEK MEDICAL CARE IF:   You think you are having a reaction to medicines taken.  You have recurrent headaches or feel dizzy.  You have swelling in your ankles.  You have trouble with your vision. SEEK IMMEDIATE MEDICAL CARE IF:  You develop a severe headache or confusion.  You have unusual weakness, numbness, or feel faint.  You have severe chest or abdominal pain.  You vomit repeatedly.  You have trouble breathing. MAKE SURE YOU:   Understand these instructions.  Will watch your condition.  Will get help right away if you are not doing well or get worse.   This information is not intended to replace advice given to you by your health care provider. Make sure you discuss any questions you have with your health care provider.   Document Released: 08/09/2005 Document Revised: 12/24/2014 Document Reviewed: 06/01/2013 Elsevier Interactive Patient Education 2016 Elsevier Inc. DASH Eating Plan DASH stands for "Dietary Approaches to Stop Hypertension." The DASH eating plan is a healthy eating plan that has been shown to reduce high blood pressure (hypertension). Additional health benefits may include reducing the risk of type 2 diabetes mellitus, heart disease, and stroke. The DASH eating plan may also help with weight loss. WHAT DO I NEED TO KNOW ABOUT THE DASH EATING PLAN? For the DASH eating plan, you will follow these general guidelines:  Choose foods with a percent daily value for sodium of less than 5% (as listed on the food label).  Use salt-free seasonings or herbs instead of table salt or sea salt.  Check with your health care provider or pharmacist before using salt substitutes.  Eat lower-sodium products, often labeled as "lower sodium" or "no salt added."  Eat fresh foods.  Eat more vegetables, fruits, and low-fat dairy products.  Choose whole grains.  Look for the word "whole" as the first word in the ingredient list.  Choose fish and skinless chicken or turkey more often than red meat. Limit fish, poultry, and meat to 6 oz (170 g) each day.  Limit sweets, desserts, sugars, and sugary drinks.  Choose heart-healthy fats.  Limit cheese to 1 oz (28 g) per day.  Eat more home-cooked food and less restaurant, buffet, and fast food.  Limit fried foods.  Cook foods using methods other than frying.  Limit canned vegetables. If you do use them, rinse them well to decrease the sodium.  When eating at a restaurant, ask that your food be prepared with less salt, or no salt if possible. WHAT FOODS CAN I EAT? Seek help from a dietitian for individual calorie needs. Grains Whole grain or whole wheat bread. Brown rice. Whole grain or whole wheat pasta. Quinoa, bulgur, and whole grain cereals. Low-sodium cereals. Corn or whole wheat flour tortillas. Whole grain cornbread. Whole grain crackers. Low-sodium crackers. Vegetables Fresh or frozen vegetables (raw, steamed, roasted, or grilled). Low-sodium or reduced-sodium tomato and vegetable juices. Low-sodium or reduced-sodium tomato sauce and paste. Low-sodium or reduced-sodium canned vegetables.  Fruits All fresh, canned (in natural juice), or frozen fruits. Meat and Other   Other Protein Products Ground beef (85% or leaner), grass-fed beef, or beef trimmed of fat. Skinless chicken or [place]. Ground chicken or [place]. Pork trimmed of fat. All fish and seafood. Eggs. Dried beans, peas, or lentils. Unsalted nuts and seeds. Unsalted canned beans. Dairy Low-fat dairy products, such as skim or 1% milk, 2% or reduced-fat cheeses, low-fat ricotta or cottage cheese, or plain low-fat yogurt. Low-sodium or reduced-sodium cheeses. Fats and Oils Tub margarines without trans fats. Light or reduced-fat mayonnaise and salad dressings (reduced sodium). Avocado. Safflower, olive, or canola oils. Natural peanut or  almond butter. Other Unsalted popcorn and pretzels. The items listed above may not be a complete list of recommended foods or beverages. Contact your dietitian for more options. WHAT FOODS ARE NOT RECOMMENDED? Grains White bread. White pasta. White rice. Refined cornbread. Bagels and croissants. Crackers that contain trans fat. Vegetables Creamed or fried vegetables. Vegetables in a cheese sauce. Regular canned vegetables. Regular canned tomato sauce and paste. Regular tomato and vegetable juices. Fruits Dried fruits. Canned fruit in light or heavy syrup. Fruit juice. Meat and Other Protein Products Fatty cuts of meat. Ribs, chicken wings, bacon, sausage, bologna, salami, chitterlings, fatback, hot dogs, bratwurst, and packaged luncheon meats. Salted nuts and seeds. Canned beans with salt. Dairy Whole or 2% milk, cream, half-and-half, and cream cheese. Whole-fat or sweetened yogurt. Full-fat cheeses or blue cheese. Nondairy creamers and whipped toppings. Processed cheese, cheese spreads, or cheese curds. Condiments Onion and garlic salt, seasoned salt, table salt, and sea salt. Canned and packaged gravies. Worcestershire sauce. Tartar sauce. Barbecue sauce. Teriyaki sauce. Soy sauce, including reduced sodium. Steak sauce. Fish sauce. Oyster sauce. Cocktail sauce. Horseradish. Ketchup and mustard. Meat flavorings and tenderizers. Bouillon cubes. Hot sauce. Tabasco sauce. Marinades. Taco seasonings. Relishes. Fats and Oils Butter, stick margarine, lard, shortening, ghee, and bacon fat. Coconut, palm kernel, or palm oils. Regular salad dressings. Other Pickles and olives. Salted popcorn and pretzels. The items listed above may not be a complete list of foods and beverages to avoid. Contact your dietitian for more information. WHERE CAN I FIND MORE INFORMATION? National Heart, Lung, and Blood Institute: CablePromo.itwww.nhlbi.nih.gov/health/health-topics/topics/dash/   This information is not intended to  replace advice given to you by your health care provider. Make sure you discuss any questions you have with your health care provider.   Document Released: 07/29/2011 Document Revised: 08/30/2014 Document Reviewed: 06/13/2013 Elsevier Interactive Patient Education 2016 Elsevier Inc. Muscle Strain A muscle strain is an injury that occurs when a muscle is stretched beyond its normal length. Usually a small number of muscle fibers are torn when this happens. Muscle strain is rated in degrees. First-degree strains have the least amount of muscle fiber tearing and pain. Second-degree and third-degree strains have increasingly more tearing and pain.  Usually, recovery from muscle strain takes 1-2 weeks. Complete healing takes 5-6 weeks.  CAUSES  Muscle strain happens when a sudden, violent force placed on a muscle stretches it too far. This may occur with lifting, sports, or a fall.  RISK FACTORS Muscle strain is especially common in athletes.  SIGNS AND SYMPTOMS At the site of the muscle strain, there may be:  Pain.  Bruising.  Swelling.  Difficulty using the muscle due to pain or lack of normal function. DIAGNOSIS  Your health care provider will perform a physical exam and ask about your medical history. TREATMENT  Often, the best treatment for a muscle strain is resting, icing, and applying cold compresses to the injured area.  HOME CARE INSTRUCTIONS   Use the PRICE method of treatment to promote muscle healing during the first 2-3 days after your injury. The PRICE method involves:  Protecting the muscle from being injured again.  Restricting your activity and resting the injured body part.  Icing your injury. To do this, put ice in a plastic bag. Place a towel between your skin and the bag. Then, apply the ice and leave it on from 15-20 minutes each hour. After the third day, switch to moist heat packs.  Apply compression to the injured area with a splint or elastic bandage. Be  careful not to wrap it too tightly. This may interfere with blood circulation or increase swelling.  Elevate the injured body part above the level of your heart as often as you can.  Only take over-the-counter or prescription medicines for pain, discomfort, or fever as directed by your health care provider.  Warming up prior to exercise helps to prevent future muscle strains. SEEK MEDICAL CARE IF:   You have increasing pain or swelling in the injured area.  You have numbness, tingling, or a significant loss of strength in the injured area. MAKE SURE YOU:   Understand these instructions.  Will watch your condition.  Will get help right away if you are not doing well or get worse.   This information is not intended to replace advice given to you by your health care provider. Make sure you discuss any questions you have with your health care provider.   Document Released: 08/09/2005 Document Revised: 05/30/2013 Document Reviewed: 03/08/2013 Elsevier Interactive Patient Education [employer]

## 2017-05-04 ENCOUNTER — Emergency Department (HOSPITAL_COMMUNITY)
Admission: EM | Admit: 2017-05-04 | Discharge: 2017-05-04 | Disposition: A | Discharge status: Home / Self Care | Payer: Self-pay | Attending: Emergency Medicine | Admitting: Emergency Medicine

## 2017-05-04 ENCOUNTER — Encounter (HOSPITAL_COMMUNITY): Payer: Self-pay

## 2017-05-04 DIAGNOSIS — Z7982 Long term (current) use of aspirin: Secondary | ICD-10-CM | POA: Insufficient documentation

## 2017-05-04 DIAGNOSIS — L0291 Cutaneous abscess, unspecified: Secondary | ICD-10-CM

## 2017-05-04 DIAGNOSIS — F17210 Nicotine dependence, cigarettes, uncomplicated: Secondary | ICD-10-CM | POA: Insufficient documentation

## 2017-05-04 DIAGNOSIS — Z79899 Other long term (current) drug therapy: Secondary | ICD-10-CM | POA: Insufficient documentation

## 2017-05-04 DIAGNOSIS — L02214 Cutaneous abscess of groin: Secondary | ICD-10-CM | POA: Insufficient documentation

## 2017-05-04 DIAGNOSIS — I10 Essential (primary) hypertension: Secondary | ICD-10-CM | POA: Insufficient documentation

## 2017-05-04 HISTORY — PX: INCISION AND DRAINAGE ABSCESS: SHX5864

## 2017-05-04 MED ORDER — DOXYCYCLINE HYCLATE 100 MG PO CAPS: 100.0000 mg | ORAL_CAPSULE | Freq: Two times a day (BID) | ORAL | 0 refills | Status: DC

## 2017-05-04 MED ORDER — LIDOCAINE-EPINEPHRINE (PF) 2 %-1:200000 IJ SOLN
10.0000 mL | Freq: Once | INTRAMUSCULAR | Status: AC
  Administered 2017-05-04: 10 mL
  Filled 2017-05-04: qty 20

## 2017-05-04 NOTE — ED Triage Notes (Signed)
Per Pt, pt is coming from home with complaints of abscess noted to his groin that has started leaking pus. Pt reports hx of the same. Denies it to be near his rectum.

## 2017-05-04 NOTE — ED Provider Notes (Signed)
[facility]-EMERGENCY DEPT Provider Note   CSN: [id number] Arrival date & time: 05/04/17  4098     History   Chief Complaint Chief Complaint  Patient presents with  . Abscess    HPI Shawn [name] is a 31 y.o. male.Apart with chief complaint of groin abscess. He has a history of recurrent infections in his groin area. The patient states that he's had about 1 week of worsening swelling and abscess development in his right groin. He states that one of them opened and started draining today but the other 2 are still painful and swollen. He denies any fevers or chills. He has a history of dilated cardiac myopathy. His history of perirectal abscess but denies any abscess near his rectum or pain with defecation.  HPI  Past Medical History:  Diagnosis Date  . Abscess, perirectal, x2  05/18/2013  . Dilated cardiomyopathy (HCC) 05/18/2013   By catheterization 2014   . Hypertension     Patient Active Problem List   Diagnosis Date Noted  . Tobacco abuse 05/18/2013  . Dilated cardiomyopathy (HCC) 05/18/2013  . Abscess, perirectal, x2  05/18/2013  . Chest pain at rest 11/20/2012  . Hypertension 11/20/2012    Past Surgical History:  Procedure Laterality Date  . LEFT AND RIGHT HEART CATHETERIZATION WITH CORONARY ANGIOGRAM N/A 11/21/2012   Procedure: LEFT AND RIGHT HEART CATHETERIZATION WITH CORONARY ANGIOGRAM;  Surgeon: Ricki Rodriguez, MD;  Location: [facility]ATH [facility];  Service: Cardiovascular;  Laterality: N/A;  . MANDIBLE FRACTURE SURGERY  2006       Home Medications    Prior to Admission medications   Medication Sig Start Date End Date Taking? Authorizing Provider  aspirin 81 MG tablet Take 81 mg by mouth every morning.     [provider]  cyclobenzaprine (FLEXERIL) 10 MG tablet Take 1 tablet (10 mg total) by mouth 2 (two) times daily as needed for muscle spasms. 11/17/15   Danelle Berry, PA-C  doxycycline (VIBRAMYCIN) 100 MG capsule Take 1 capsule (100 mg total) by mouth 2 (two)  times daily. 05/04/17   Tavie Haseman, Cammy Copa, PA-C  lisinopril (PRINIVIL,ZESTRIL) 10 MG tablet Take 1 tablet (10 mg total) by mouth daily. 01/10/14   Muthersbaugh, Dahlia Client, PA-C  metoprolol (LOPRESSOR) 50 MG tablet Take 0.5 tablets (25 mg total) by mouth 2 (two) times daily. 01/10/14   Muthersbaugh, Dahlia Client, PA-C  naproxen (NAPROSYN) 375 MG tablet Take 1 tablet (375 mg total) by mouth 2 (two) times daily. 03/01/16   Arthor Captain, PA-C    Family History Family History  Problem Relation Age of Onset  . Diabetes Mother   . Hypertension Other     Social History Social History  Substance Use Topics  . Smoking status: Current Every Day Smoker    Packs/day: 1.00    Types: Cigarettes  . Smokeless tobacco: Never Used  . Alcohol use Yes     Comment: occ     Allergies   Shrimp [shellfish allergy]   Review of Systems Review of Systems  Ten systems reviewed and are negative for acute change, except as noted in the HPI.   Physical Exam Updated Vital Signs BP (!) 160/108 (BP Location: Left Arm)   Pulse 84   Temp 98.3 F (36.8 C) (Oral)   Resp 18   Ht  (1.803 m)   Wt 97.5 kg (215 lb)   SpO2 100%   BMI 29.99 kg/m   Physical Exam  Constitutional: He appears well-developed and well-nourished. No distress.  HENT:  Head: Normocephalic and atraumatic.  Eyes: Conjunctivae are normal. No scleral icterus.  Neck: Normal range of motion. Neck supple.  Cardiovascular: Normal rate, regular rhythm and normal heart sounds.   Pulmonary/Chest: Effort normal and breath sounds normal. No respiratory distress.  Abdominal: Soft. There is no tenderness.  Musculoskeletal: He exhibits no edema.  Neurological: He is alert.  Skin: Skin is warm and dry. He is not diaphoretic.  Multiple areas of purulent drainage, the skin of the right groin is hypertrophic and scarred from previous infection. He has an indurated right gluteal region, indurated and fluctuant areas all along the right groin.    Psychiatric: His behavior is normal.  Nursing note and vitals reviewed.    ED Treatments / Results  Labs (all labs ordered are listed, but only abnormal results are displayed) Labs Reviewed - No data to display  EKG  EKG Interpretation None       Radiology No results found.  Procedures .Marland Kitchen.Incision and Drainage Date/Time: 05/04/2017 10:47 AM Performed by: Arthor CaptainHARRIS, Madelena Maturin Authorized by: Arthor CaptainHARRIS, Crissa Sowder   Consent:    Consent obtained:  Verbal   Consent given by:  Patient   Risks discussed:  Bleeding, incomplete drainage, pain, infection and damage to other organs   Alternatives discussed:  No treatment Location:    Type:  Abscess   Size:  Multiple small abscesses along the right groin and inferior gluteal tissue   Location:  Anogenital Pre-procedure details:    Skin preparation:  Betadine Anesthesia (see MAR for exact dosages):    Anesthesia method:  Local infiltration   Local anesthetic:  Lidocaine 2% WITH epi (10cc) Procedure type:    Complexity:  Complex Procedure details:    Incision types:  Stab incision   Incision depth:  Dermal   Scalpel blade:  11   Wound management:  Irrigated with saline and probed and deloculated   Drainage:  Bloody and purulent   Drainage amount:  Moderate   Wound treatment:  Wound left open   Packing materials:  None Post-procedure details:    Patient tolerance of procedure:  Tolerated well, no immediate complications   (including critical care time)  Medications Ordered in ED Medications  lidocaine-EPINEPHrine (XYLOCAINE W/EPI) 2 %-1:200000 (PF) injection 10 mL (10 mLs Infiltration Given 05/04/17 0919)     Initial Impression / Assessment and Plan / ED Course  I have reviewed the triage vital signs and the nursing notes.  Pertinent labs & imaging results that were available during my care of the patient were reviewed by me and considered in my medical decision making (see chart for details).     Patient with skin abscess  amenable to incision and drainage.  Abscess was not large enough to warrant packing or drain,  wound recheck in 2 days. Encouraged home warm soaks and flushing.  Mild signs of cellulitis is surrounding skin.  Will d/c to home.   Final Clinical Impressions(s) / ED Diagnoses   Final diagnoses:  Abscess    New Prescriptions New Prescriptions   DOXYCYCLINE (VIBRAMYCIN) 100 MG CAPSULE    Take 1 capsule (100 mg total) by mouth 2 (two) times daily.     Arthor CaptainHarris, Jakyah Bradby, PA-C 05/04/17 1051    Gwyneth SproutPlunkett, Whitney, MD 05/05/17 2103

## 2017-05-04 NOTE — ED Notes (Addendum)
ERROR

## 2017-05-04 NOTE — Discharge Instructions (Signed)
Contact a health care provider if: °Your cyst or abscess returns. °You have a fever. °You have more redness, swelling, or pain around your incision. °You have more fluid or blood coming from your incision. °Your incision feels warm to the touch. °You have pus or a bad smell coming from your incision. °Get help right away if: °You have severe pain or bleeding. °You cannot eat or drink without vomiting. °You have decreased urine output. °You become short of breath. °You have chest pain. °You cough up blood. °The area where the incision and drainage occurred becomes numb or it tingles °

## 2017-11-23 ENCOUNTER — Emergency Department (HOSPITAL_COMMUNITY)
Admission: EM | Admit: 2017-11-23 | Discharge: 2017-11-23 | Disposition: A | Discharge status: Home / Self Care | Payer: Self-pay | Attending: Emergency Medicine | Admitting: Emergency Medicine

## 2017-11-23 ENCOUNTER — Encounter (HOSPITAL_COMMUNITY): Payer: Self-pay | Admitting: Emergency Medicine

## 2017-11-23 DIAGNOSIS — I10 Essential (primary) hypertension: Secondary | ICD-10-CM | POA: Insufficient documentation

## 2017-11-23 DIAGNOSIS — R52 Pain, unspecified: Secondary | ICD-10-CM

## 2017-11-23 DIAGNOSIS — Z79899 Other long term (current) drug therapy: Secondary | ICD-10-CM | POA: Insufficient documentation

## 2017-11-23 DIAGNOSIS — M7918 Myalgia, other site: Secondary | ICD-10-CM | POA: Insufficient documentation

## 2017-11-23 DIAGNOSIS — F17210 Nicotine dependence, cigarettes, uncomplicated: Secondary | ICD-10-CM | POA: Insufficient documentation

## 2017-11-23 DIAGNOSIS — Z7982 Long term (current) use of aspirin: Secondary | ICD-10-CM | POA: Insufficient documentation

## 2017-11-23 MED ORDER — METOPROLOL TARTRATE 50 MG PO TABS: 25.0000 mg | ORAL_TABLET | Freq: Two times a day (BID) | ORAL | 0 refills | Status: DC

## 2017-11-23 MED ORDER — LISINOPRIL 10 MG PO TABS: 10.0000 mg | ORAL_TABLET | Freq: Every day | ORAL | 0 refills | Status: DC

## 2017-11-23 NOTE — Discharge Instructions (Addendum)
Please read attached information. If you experience any new or worsening signs or symptoms please return to the emergency room for evaluation. Please follow-up with your primary care provider or specialist as discussed.  °

## 2017-11-23 NOTE — ED Triage Notes (Signed)
Pt reports headache, body aches, mild cough, and is febrile today in triage. Pt states this started Sunday.

## 2017-11-23 NOTE — ED Provider Notes (Signed)
Patient placed in Quick Look pathway, seen and evaluated   Chief Complaint: headache  HPI:   Pt presenting to the ED with sudden onset of HA that began on Sunday. He reports assoc body aches, tiredness, and mild dry cough. Denies sore throat, congestion or runny nose. Has not had influenza vaccine this season. Has not taken any OTC medications for sx.  ROS: + HA, + fever, + myalgias, + cough  Physical Exam:   Gen: No distress  Neuro: Awake and Alert  Skin: Warm    Focused Exam: heart sounds normal. Lungs CTAB.   Initiation of care has begun. The patient has been counseled on the process, plan, and necessity for staying for the completion/evaluation, and the remainder of the medical screening examination    Dyron Kawano, [place] N, PA-C 11/23/17 1828    Little, Ambrose Finlandachel Morgan, MD 11/24/17 [phone]

## 2017-11-23 NOTE — ED Provider Notes (Signed)
[name] EMERGENCY DEPARTMENT Provider Note   CSN: [id number] Arrival date & time: 11/23/17  1808     History   Chief Complaint Chief Complaint  Patient presents with  . Headache  . Generalized Body Aches    HPI Shawn [name] is a 32 y.o. male.  HPI   32 year old male presents today with complaints of body aches.  Patient reports fatigue and body aches over the last several days.  Nursing note reports cough and headache, patient denies any cough or headache throughout my evaluation.  He denies any rash, sore throat, ear pain, nasal congestion, abdominal pain, urinary changes, diarrhea, vomiting, or any other infectious etiology.  Patient denies fever at home.  He denies any other complaints here today.  He denies any neck stiffness or pain.    Past Medical History:  Diagnosis Date  . Abscess, perirectal, x2  05/18/2013  . Dilated cardiomyopathy (HCC) 05/18/2013   By catheterization 2014   . Hypertension     Patient Active Problem List   Diagnosis Date Noted  . Tobacco abuse 05/18/2013  . Dilated cardiomyopathy (HCC) 05/18/2013  . Abscess, perirectal, x2  05/18/2013  . Chest pain at rest 11/20/2012  . Hypertension 11/20/2012    Past Surgical History:  Procedure Laterality Date  . LEFT AND RIGHT HEART CATHETERIZATION WITH CORONARY ANGIOGRAM N/A 11/21/2012   Procedure: LEFT AND RIGHT HEART CATHETERIZATION WITH CORONARY ANGIOGRAM;  Surgeon: Ricki Rodriguez, MD;  Location: [facility] CATH LAB;  Service: Cardiovascular;  Laterality: N/A;  . MANDIBLE FRACTURE SURGERY  2006        Home Medications    Prior to Admission medications   Medication Sig Start Date End Date Taking? Authorizing Provider  aspirin 81 MG tablet Take 81 mg by mouth every morning.     [provider]  cyclobenzaprine (FLEXERIL) 10 MG tablet Take 1 tablet (10 mg total) by mouth 2 (two) times daily as needed for muscle spasms. Patient not taking: Reported on 11/23/2017 11/17/15    Danelle Berry, PA-C  doxycycline (VIBRAMYCIN) 100 MG capsule Take 1 capsule (100 mg total) by mouth 2 (two) times daily. Patient not taking: Reported on 11/23/2017 05/04/17   Arthor Captain, PA-C  lisinopril (PRINIVIL,ZESTRIL) 10 MG tablet Take 1 tablet (10 mg total) by mouth daily. 11/23/17   Wing Schoch, Tinnie Gens, PA-C  metoprolol tartrate (LOPRESSOR) 50 MG tablet Take 0.5 tablets (25 mg total) by mouth 2 (two) times daily. 11/23/17   Briza Bark, Tinnie Gens, PA-C  naproxen (NAPROSYN) 375 MG tablet Take 1 tablet (375 mg total) by mouth 2 (two) times daily. Patient not taking: Reported on 11/23/2017 03/01/16   Arthor Captain, PA-C    Family History Family History  Problem Relation Age of Onset  . Diabetes Mother   . Hypertension Other     Social History Social History   Tobacco Use  . Smoking status: Current Every Day Smoker    Packs/day: 1.00    Types: Cigarettes  . Smokeless tobacco: Never Used  Substance Use Topics  . Alcohol use: Yes    Comment: occ  . Drug use: No     Allergies   Shrimp [shellfish allergy]   Review of Systems Review of Systems  All other systems reviewed and are negative.   Physical Exam Updated Vital Signs BP (!) 168/101 (BP Location: Right Arm)   Pulse 69   Temp 100 F (37.8 C) (Oral)   Resp 18   Ht 5\' 10"  (1.778 m)   Wt 104.3  kg (230 lb)   SpO2 100%   BMI 33.00 kg/m   Physical Exam  Constitutional: He is oriented to person, place, and time. He appears well-developed and well-nourished.  HENT:  Head: Normocephalic and atraumatic.  Right Ear: Hearing and tympanic membrane normal.  Left Ear: Hearing and tympanic membrane normal.  Nose: Nose normal. No rhinorrhea.  Mouth/Throat: Uvula is midline, oropharynx is clear and moist and mucous membranes are normal. No oropharyngeal exudate, posterior oropharyngeal edema, posterior oropharyngeal erythema or tonsillar abscesses.  Eyes: Pupils are equal, round, and reactive to light. Conjunctivae are normal. Right  eye exhibits no discharge. Left eye exhibits no discharge. No scleral icterus.  Neck: Normal range of motion. No JVD present. No tracheal deviation present.  Cardiovascular: Normal rate, regular rhythm, normal heart sounds and intact distal pulses. Exam reveals no gallop and no friction rub.  No murmur heard. Pulmonary/Chest: Effort normal. No stridor.  Abdominal: Soft. Bowel sounds are normal. He exhibits no distension and no mass. There is no tenderness. There is no rebound and no guarding. No hernia.  Musculoskeletal: Normal range of motion. He exhibits no edema.  Neurological: He is alert and oriented to person, place, and time. Coordination normal.  Psychiatric: He has a normal mood and affect. His behavior is normal. Judgment and thought content normal.  Nursing note and vitals reviewed.    ED Treatments / Results  Labs (all labs ordered are listed, but only abnormal results are displayed) Labs Reviewed - No data to display  EKG None  Radiology No results found.  Procedures Procedures (including critical care time)  Medications Ordered in ED Medications - No data to display   Initial Impression / Assessment and Plan / ED Course  I have reviewed the triage vital signs and the nursing notes.  Pertinent labs & imaging results that were available during my care of the patient were reviewed by me and considered in my medical decision making (see chart for details).      Final Clinical Impressions(s) / ED Diagnoses   Final diagnoses:  Body aches   Labs:   Imaging:  Consults:  Therapeutics:  Discharge Meds: Metoprolol, lisinopril  Assessment/Plan: 32 year old male presents today with body aches and fatigue.  I have higher suspicion for viral illness, slight elevation in temperature of 100, he has no signs of tachycardia.  Patient has no objective findings on my exam, low suspicion for acute bacterial infection.  Patient also hypertensive here reporting that he has  not been taking his antihypertensive medication.  Patient will monitor symptoms at home, return immediately with any new or worsening signs or symptoms.  Patient verbalized understanding and agreement to today's plan.       ED Discharge Orders        Ordered    lisinopril (PRINIVIL,ZESTRIL) 10 MG tablet  Daily     11/23/17 2024    metoprolol tartrate (LOPRESSOR) 50 MG tablet  2 times daily     11/23/17 2024       Eyvonne MechanicHedges, Heavenlee Maiorana, PA-C 11/23/17 2122    Pricilla LovelessGoldston, Scott, MD 11/24/17 2250

## 2018-04-16 ENCOUNTER — Emergency Department (HOSPITAL_COMMUNITY)
Admission: EM | Admit: 2018-04-16 | Discharge: 2018-04-16 | Disposition: A | Discharge status: Home / Self Care | Payer: Self-pay | Attending: Emergency Medicine | Admitting: Emergency Medicine

## 2018-04-16 ENCOUNTER — Other Ambulatory Visit: Payer: Self-pay

## 2018-04-16 ENCOUNTER — Encounter (HOSPITAL_COMMUNITY): Payer: Self-pay | Admitting: Emergency Medicine

## 2018-04-16 ENCOUNTER — Emergency Department (HOSPITAL_COMMUNITY): Payer: Self-pay

## 2018-04-16 DIAGNOSIS — Y9389 Activity, other specified: Secondary | ICD-10-CM | POA: Insufficient documentation

## 2018-04-16 DIAGNOSIS — W228XXA Striking against or struck by other objects, initial encounter: Secondary | ICD-10-CM | POA: Insufficient documentation

## 2018-04-16 DIAGNOSIS — M79641 Pain in right hand: Secondary | ICD-10-CM | POA: Insufficient documentation

## 2018-04-16 DIAGNOSIS — Z7982 Long term (current) use of aspirin: Secondary | ICD-10-CM | POA: Insufficient documentation

## 2018-04-16 DIAGNOSIS — Y999 Unspecified external cause status: Secondary | ICD-10-CM | POA: Insufficient documentation

## 2018-04-16 DIAGNOSIS — Y929 Unspecified place or not applicable: Secondary | ICD-10-CM | POA: Insufficient documentation

## 2018-04-16 DIAGNOSIS — F17210 Nicotine dependence, cigarettes, uncomplicated: Secondary | ICD-10-CM | POA: Insufficient documentation

## 2018-04-16 DIAGNOSIS — R600 Localized edema: Secondary | ICD-10-CM | POA: Insufficient documentation

## 2018-04-16 DIAGNOSIS — I10 Essential (primary) hypertension: Secondary | ICD-10-CM | POA: Insufficient documentation

## 2018-04-16 MED ORDER — NAPROXEN 500 MG PO TABS: 500.0000 mg | ORAL_TABLET | Freq: Two times a day (BID) | ORAL | 0 refills | Status: AC
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
## 2018-04-16 NOTE — Discharge Instructions (Addendum)
Please use brace for right wrist.I have prescribed NSAIDS which should help with your symptoms. If your symptoms worsen you may follow up with Hand specialist, I have provided the number.Apply heat/ice to the area along with keep hand elevated.

## 2018-04-16 NOTE — ED Triage Notes (Signed)
Pt. Stated, I have pain in my hands, started last week and I had an episode in May

## 2018-10-07 ENCOUNTER — Emergency Department (HOSPITAL_COMMUNITY)
Admission: EM | Admit: 2018-10-07 | Discharge: 2018-10-07 | Disposition: A | Discharge status: Home / Self Care | Payer: Self-pay | Attending: Emergency Medicine | Admitting: Emergency Medicine

## 2018-10-07 ENCOUNTER — Other Ambulatory Visit: Payer: Self-pay

## 2018-10-07 ENCOUNTER — Emergency Department (HOSPITAL_COMMUNITY): Payer: Self-pay

## 2018-10-07 ENCOUNTER — Encounter (HOSPITAL_COMMUNITY): Payer: Self-pay | Admitting: *Deleted

## 2018-10-07 DIAGNOSIS — Y907 Blood alcohol level of 200-239 mg/100 ml: Secondary | ICD-10-CM | POA: Insufficient documentation

## 2018-10-07 DIAGNOSIS — S61411A Laceration without foreign body of right hand, initial encounter: Secondary | ICD-10-CM | POA: Insufficient documentation

## 2018-10-07 DIAGNOSIS — Y939 Activity, unspecified: Secondary | ICD-10-CM | POA: Insufficient documentation

## 2018-10-07 DIAGNOSIS — Y999 Unspecified external cause status: Secondary | ICD-10-CM | POA: Insufficient documentation

## 2018-10-07 DIAGNOSIS — I10 Essential (primary) hypertension: Secondary | ICD-10-CM | POA: Insufficient documentation

## 2018-10-07 DIAGNOSIS — S0001XA Abrasion of scalp, initial encounter: Secondary | ICD-10-CM | POA: Insufficient documentation

## 2018-10-07 DIAGNOSIS — Y929 Unspecified place or not applicable: Secondary | ICD-10-CM | POA: Insufficient documentation

## 2018-10-07 DIAGNOSIS — F17210 Nicotine dependence, cigarettes, uncomplicated: Secondary | ICD-10-CM | POA: Insufficient documentation

## 2018-10-07 DIAGNOSIS — Z7982 Long term (current) use of aspirin: Secondary | ICD-10-CM | POA: Insufficient documentation

## 2018-10-07 DIAGNOSIS — F10920 Alcohol use, unspecified with intoxication, uncomplicated: Secondary | ICD-10-CM | POA: Insufficient documentation

## 2018-10-07 DIAGNOSIS — S060X9A Concussion with loss of consciousness of unspecified duration, initial encounter: Secondary | ICD-10-CM | POA: Insufficient documentation

## 2018-10-07 DIAGNOSIS — X58XXXA Exposure to other specified factors, initial encounter: Secondary | ICD-10-CM | POA: Insufficient documentation

## 2018-10-07 DIAGNOSIS — Z23 Encounter for immunization: Secondary | ICD-10-CM | POA: Insufficient documentation

## 2018-10-07 LAB — CBC
HCT: 40.3 % (ref 39.0–52.0)
Hemoglobin: 13.3 g/dL (ref 13.0–17.0)
MCH: 29.3 pg (ref 26.0–34.0)
MCHC: 33 g/dL (ref 30.0–36.0)
MCV: 88.8 fL (ref 80.0–100.0)
Platelets: 324 10*3/uL (ref 150–400)
RBC: 4.54 MIL/uL (ref 4.22–5.81)
RDW: 15 % (ref 11.5–15.5)
WBC: 10.7 10*3/uL — ABNORMAL HIGH (ref 4.0–10.5)
nRBC: 0 % (ref 0.0–0.2)

## 2018-10-07 LAB — COMPREHENSIVE METABOLIC PANEL
ALBUMIN: 3.7 g/dL (ref 3.5–5.0)
ALT: 29 U/L (ref 0–44)
AST: 24 U/L (ref 15–41)
Alkaline Phosphatase: 102 U/L (ref 38–126)
Anion gap: 12 (ref 5–15)
BUN: 21 mg/dL — AB (ref 6–20)
CO2: 14 mmol/L — ABNORMAL LOW (ref 22–32)
Calcium: 8.3 mg/dL — ABNORMAL LOW (ref 8.9–10.3)
Chloride: 113 mmol/L — ABNORMAL HIGH (ref 98–111)
Creatinine, Ser: 1.48 mg/dL — ABNORMAL HIGH (ref 0.61–1.24)
GFR calc Af Amer: >60 mL/min (ref >60)
GFR calc non Af Amer: >60 mL/min (ref >60)
GLUCOSE: 127 mg/dL — AB (ref 70–99)
Potassium: 3.5 mmol/L (ref 3.5–5.1)
SODIUM: 139 mmol/L (ref 135–145)
Total Bilirubin: 1 mg/dL (ref 0.3–1.2)
Total Protein: 8.3 g/dL — ABNORMAL HIGH (ref 6.5–8.1)

## 2018-10-07 LAB — ETHANOL: Alcohol, Ethyl (B): 200 mg/dL — ABNORMAL HIGH (ref <10)

## 2018-10-07 LAB — PROTIME-INR
INR: 1.17
Prothrombin Time: 14.8 seconds (ref 11.4–15.2)

## 2018-10-07 LAB — SAMPLE TO BLOOD BANK

## 2018-10-07 LAB — CDS SEROLOGY
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
## 2018-10-07 MED ORDER — SODIUM CHLORIDE 0.9 % IV BOLUS (SEPSIS)
2000.0000 mL | Freq: Once | INTRAVENOUS | Status: AC
  Administered 2018-10-07: 2000 mL via INTRAVENOUS

## 2018-10-07 MED ORDER — HYDROCODONE-ACETAMINOPHEN 5-325 MG PO TABS
2.0000 | ORAL_TABLET | Freq: Once | ORAL | Status: AC
  Administered 2018-10-07: 2 via ORAL
  Filled 2018-10-07: qty 2

## 2018-10-07 MED ORDER — KETOROLAC TROMETHAMINE 30 MG/ML IJ SOLN
30.0000 mg | Freq: Once | INTRAMUSCULAR | Status: AC
  Administered 2018-10-07: 30 mg via INTRAVENOUS
  Filled 2018-10-07: qty 1

## 2018-10-07 MED ORDER — IBUPROFEN 400 MG PO TABS: 400.0000 mg | ORAL_TABLET | Freq: Three times a day (TID) | ORAL | 0 refills | Status: DC | PRN

## 2018-10-07 MED ORDER — LIDOCAINE HCL (PF) 1 % IJ SOLN: 30.0000 mL | Freq: Once | INTRAMUSCULAR | Status: DC

## 2018-10-07 MED ORDER — LIDOCAINE HCL (PF) 1 % IJ SOLN
30.0000 mL | Freq: Once | INTRAMUSCULAR | Status: AC
  Administered 2018-10-07: 30 mL

## 2018-10-07 MED ORDER — TETANUS-DIPHTH-ACELL PERTUSSIS 5-2.5-18.5 LF-MCG/0.5 IM SUSP
0.5000 mL | Freq: Once | INTRAMUSCULAR | Status: AC
  Administered 2018-10-07: 0.5 mL via INTRAMUSCULAR
  Filled 2018-10-07: qty 0.5

## 2018-10-07 NOTE — ED Notes (Signed)
Suture cart at bedside with lidocaine ?

## 2018-10-07 NOTE — ED Notes (Signed)
Pt getting dressed.

## 2018-10-07 NOTE — ED Notes (Signed)
Pt soaking hand in basin of soap and water
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
## 2018-10-07 NOTE — ED Notes (Signed)
Patient verbalized understanding of discharge instructions, f/u information, and prescription medication. He denies any further needs or questions. VSS. Patient ambulatory with steady gait.
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
## 2018-10-07 NOTE — ED Notes (Signed)
Ambulated pt in hallway to restroom with steady gait.

## 2018-10-07 NOTE — Progress Notes (Signed)
Orthopedic Tech Progress Note Patient Details:  Shawn [name] 25-Nov-1985 [id number]  Ortho Devices Type of Ortho Device: Short arm splint Ortho Device/Splint Location: right hand Ortho Device/Splint Interventions: Adjustment, Application, Ordered   Post Interventions Patient Tolerated: Well Instructions Provided: Care of device, Adjustment of device   Donald Pore 10/07/2018, 8:01 AM
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
## 2018-10-07 NOTE — ED Provider Notes (Signed)
He is having difficulty with flexion and extension of his right middle finger.  He is also not being very cooperative with exam is unclear if there is any acute issue. No bony injury on x-ray.  Wound is well approximated sutures. Plan will be to splint patient and have him follow-up with hand surgery in 2 days  Overall there is no other signs of acute trauma to chest or back.  Patient is awake alert this time.  SPLINT APPLICATION Date/Time: 7:40 AM Authorized by: Joya Gaskins Consent: Verbal consent obtained. Risks and benefits: risks, benefits and alternatives were discussed Consent given by: patient Splint applied by: orthopedic technician Location details: right hand Splint type: volar splint Supplies used: ortho glass Post-procedure: The splinted body part was neurovascularly unchanged following the procedure. Patient tolerance: Patient tolerated the procedure well with no immediate complications.       Zadie Rhine, MD 10/07/18 [phone]
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
## 2019-06-01 ENCOUNTER — Other Ambulatory Visit: Payer: Self-pay

## 2019-06-01 DIAGNOSIS — Z20822 Contact with and (suspected) exposure to covid-19: Secondary | ICD-10-CM

## 2019-06-02 LAB — NOVEL CORONAVIRUS, NAA: SARS-CoV-2, NAA: NOT DETECTED

## 2019-10-25 ENCOUNTER — Emergency Department (HOSPITAL_COMMUNITY)
Admission: EM | Admit: 2019-10-25 | Discharge: 2019-10-25 | Disposition: A | Discharge status: Home / Self Care | Payer: Self-pay | Attending: Emergency Medicine | Admitting: Emergency Medicine

## 2019-10-25 ENCOUNTER — Other Ambulatory Visit: Payer: Self-pay

## 2019-10-25 ENCOUNTER — Encounter (HOSPITAL_COMMUNITY): Payer: Self-pay

## 2019-10-25 ENCOUNTER — Emergency Department (HOSPITAL_COMMUNITY): Payer: Self-pay

## 2019-10-25 DIAGNOSIS — I10 Essential (primary) hypertension: Secondary | ICD-10-CM | POA: Insufficient documentation

## 2019-10-25 DIAGNOSIS — R0602 Shortness of breath: Secondary | ICD-10-CM | POA: Insufficient documentation

## 2019-10-25 DIAGNOSIS — Z20822 Contact with and (suspected) exposure to covid-19: Secondary | ICD-10-CM | POA: Insufficient documentation

## 2019-10-25 DIAGNOSIS — N3000 Acute cystitis without hematuria: Secondary | ICD-10-CM | POA: Insufficient documentation

## 2019-10-25 DIAGNOSIS — R0789 Other chest pain: Secondary | ICD-10-CM | POA: Insufficient documentation

## 2019-10-25 DIAGNOSIS — I420 Dilated cardiomyopathy: Secondary | ICD-10-CM | POA: Insufficient documentation

## 2019-10-25 DIAGNOSIS — F17210 Nicotine dependence, cigarettes, uncomplicated: Secondary | ICD-10-CM | POA: Insufficient documentation

## 2019-10-25 DIAGNOSIS — S61411A Laceration without foreign body of right hand, initial encounter: Secondary | ICD-10-CM | POA: Insufficient documentation

## 2019-10-25 DIAGNOSIS — R519 Headache, unspecified: Secondary | ICD-10-CM | POA: Insufficient documentation

## 2019-10-25 LAB — BASIC METABOLIC PANEL
Anion gap: 8 (ref 5–15)
BUN: 16 mg/dL (ref 6–20)
CO2: 19 mmol/L — ABNORMAL LOW (ref 22–32)
Calcium: 8.7 mg/dL — ABNORMAL LOW (ref 8.9–10.3)
Chloride: 111 mmol/L (ref 98–111)
Creatinine, Ser: 1.28 mg/dL — ABNORMAL HIGH (ref 0.61–1.24)
GFR calc Af Amer: >60 mL/min (ref >60)
GFR calc non Af Amer: >60 mL/min (ref >60)
Glucose, Bld: 102 mg/dL — ABNORMAL HIGH (ref 70–99)
Potassium: 4.4 mmol/L (ref 3.5–5.1)
Sodium: 138 mmol/L (ref 135–145)

## 2019-10-25 LAB — URINALYSIS, ROUTINE W REFLEX MICROSCOPIC
Bilirubin Urine: NEGATIVE
Glucose, UA: NEGATIVE mg/dL
Hgb urine dipstick: NEGATIVE
Ketones, ur: NEGATIVE mg/dL
Nitrite: POSITIVE — AB
Protein, ur: NEGATIVE mg/dL
Specific Gravity, Urine: 1.013 (ref 1.005–1.030)
pH: 5 (ref 5.0–8.0)

## 2019-10-25 LAB — HEPATIC FUNCTION PANEL
ALT: 28 U/L (ref 0–44)
AST: 19 U/L (ref 15–41)
Albumin: 3.7 g/dL (ref 3.5–5.0)
Alkaline Phosphatase: 95 U/L (ref 38–126)
Bilirubin, Direct: 0.1 mg/dL (ref 0.0–0.2)
Indirect Bilirubin: 0.4 mg/dL (ref 0.3–0.9)
Total Bilirubin: 0.5 mg/dL (ref 0.3–1.2)
Total Protein: 8 g/dL (ref 6.5–8.1)

## 2019-10-25 LAB — CBC
HCT: 38.7 % — ABNORMAL LOW (ref 39.0–52.0)
Hemoglobin: 12.8 g/dL — ABNORMAL LOW (ref 13.0–17.0)
MCH: 30.5 pg (ref 26.0–34.0)
MCHC: 33.1 g/dL (ref 30.0–36.0)
MCV: 92.1 fL (ref 80.0–100.0)
Platelets: 347 10*3/uL (ref 150–400)
RBC: 4.2 MIL/uL — ABNORMAL LOW (ref 4.22–5.81)
RDW: 14.7 % (ref 11.5–15.5)
WBC: 17.4 10*3/uL — ABNORMAL HIGH (ref 4.0–10.5)
nRBC: 0 % (ref 0.0–0.2)

## 2019-10-25 LAB — POC SARS CORONAVIRUS 2 AG -  ED: SARS Coronavirus 2 Ag: NEGATIVE

## 2019-10-25 LAB — CBG MONITORING, ED: Glucose-Capillary: 114 mg/dL — ABNORMAL HIGH (ref 70–99)

## 2019-10-25 LAB — LACTIC ACID, PLASMA
Lactic Acid, Venous: 1.1 mmol/L (ref 0.5–1.9)
Lactic Acid, Venous: 1 mmol/L (ref 0.5–1.9)

## 2019-10-25 LAB — SARS CORONAVIRUS 2 (TAT 6-24 HRS): SARS Coronavirus 2: NEGATIVE

## 2019-10-25 LAB — TROPONIN I (HIGH SENSITIVITY): Troponin I (High Sensitivity): <2 ng/L (ref <18)

## 2019-10-25 IMAGING — DX DG CHEST 1V PORT
1 series · 1 of 1 positions shown · non-contrast
Comparison: Chest radiograph [DATE]

CLINICAL DATA: Shortness of breath. Additional history provided:
Dizziness, feelings of warmth

EXAM:
PORTABLE CHEST 1 VIEW

[chest ap]
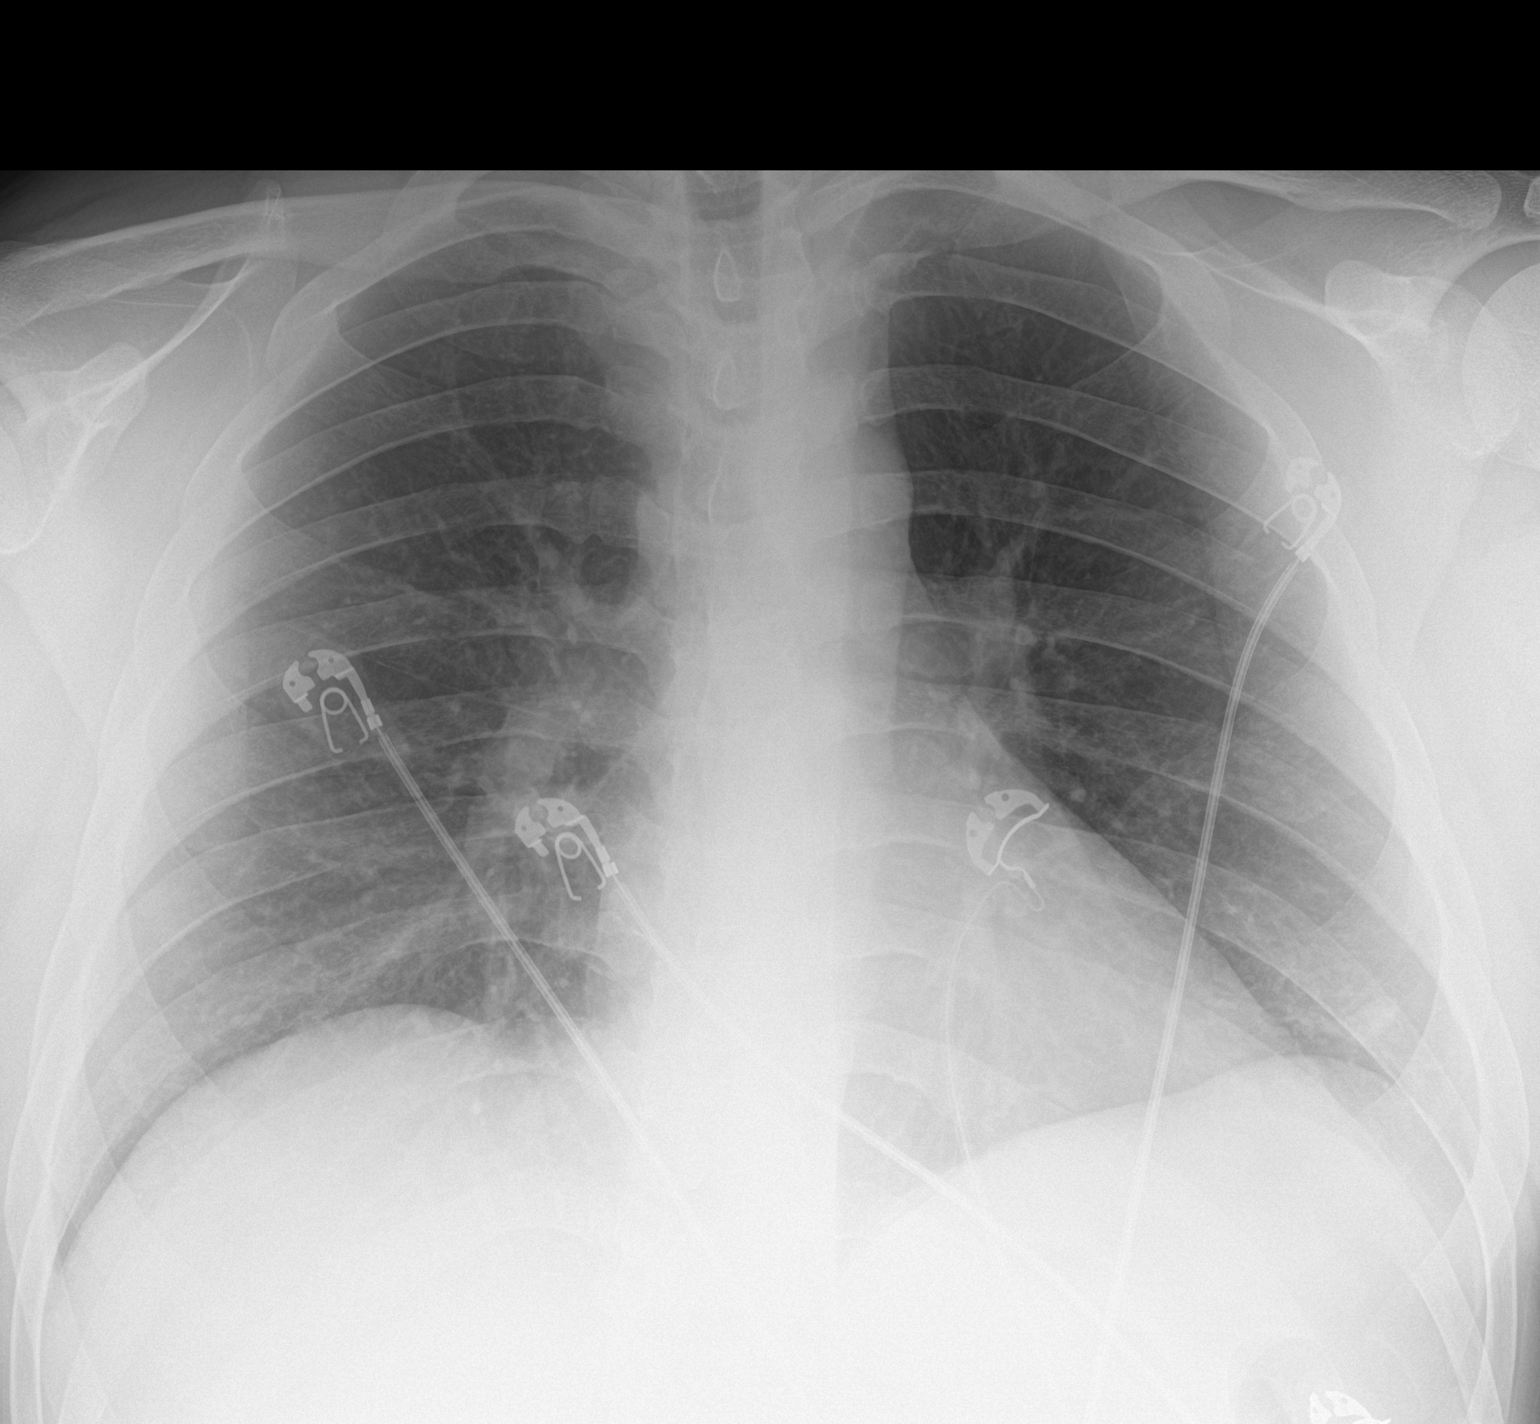

[1 of 1 positions shown; findings below may reference images not displayed]

FINDINGS: Heart size within normal limits.

There is no airspace consolidation within the lungs.

No evidence of pleural effusion or pneumothorax.

No acute bony abnormality.
IMPRESSION: No evidence of acute cardiopulmonary abnormality.

## 2019-10-25 MED ORDER — ACETAMINOPHEN 325 MG PO TABS
650.0000 mg | ORAL_TABLET | Freq: Once | ORAL | Status: AC
  Administered 2019-10-25: 650 mg via ORAL
  Filled 2019-10-25: qty 2

## 2019-10-25 MED ORDER — SODIUM CHLORIDE 0.9% FLUSH
3.0000 mL | Freq: Once | INTRAVENOUS | Status: AC
  Administered 2019-10-25: 15:00:00 3 mL via INTRAVENOUS

## 2019-10-25 MED ORDER — SODIUM CHLORIDE 0.9 % IV SOLN
1.0000 g | Freq: Once | INTRAVENOUS | Status: AC
  Administered 2019-10-25: 1 g via INTRAVENOUS
  Filled 2019-10-25: qty 10

## 2019-10-25 MED ORDER — CEPHALEXIN 500 MG PO CAPS: 500.0000 mg | ORAL_CAPSULE | Freq: Four times a day (QID) | ORAL | 0 refills | Status: AC

## 2019-10-25 MED ORDER — LISINOPRIL 10 MG PO TABS: 10.0000 mg | ORAL_TABLET | Freq: Every day | ORAL | 0 refills | Status: DC

## 2019-10-25 MED ORDER — SODIUM CHLORIDE 0.9 % IV BOLUS
1000.0000 mL | Freq: Once | INTRAVENOUS | Status: AC
  Administered 2019-10-25: 1000 mL via INTRAVENOUS

## 2019-10-25 NOTE — ED Triage Notes (Signed)
Pt presents with c/o dizziness and feeling hot. Pt reports his symptoms have been present for a couple of days. Pt denies any syncopal episodes, just reports feeling lightheaded.

## 2019-10-25 NOTE — Discharge Instructions (Addendum)
You were given a prescription for antibiotics. Please take the antibiotic prescription fully.   Take the blood pressure medications as directed.   You were tested for the coronavirus.  Results will be available within 1 to 2 days.  If the results are positive the hospital will contact you and you will need to continue quarantining.  Please follow up with your primary care provider within 5-7 days for re-evaluation of your symptoms. If you do not have a primary care provider, information for a healthcare clinic has been provided for you to make arrangements for follow up care.  You are also given information to follow-up with a urologist in regards to your urinary tract infection.  Please call the office to schedule an appointment for follow-up within the next 5 to 7 days.  Please return to the emergency department for any new or worsening symptoms.

## 2019-10-25 NOTE — ED Provider Notes (Signed)
[facility] DEPT Provider Note   CSN: [id number] Arrival date & time: 10/25/19  1158     History Chief Complaint  Patient presents with  . Dizziness    Shawn [name] is a 34 y.o. male.  HPI   Pt is a 34 y/o male with a h/o dilated cardiomyopathy, HTN, who presents to the ED today for eval of lightheadedness that started a few days ago. Sxs are intermittent and can happen at rest or with movement. He denies any vertiginous sxs at present. He also reports a mild headache.  Denies blurred vision, diploplia, facial droop, speech difficulty, ataxia, numbness, weakness. He had some dysuria yesterday which is now resolved.  He reports some SOB while driving to the ED. He states he had some chest pain around 11 am today that felt sharp and lasted for about a second. Pain was sharp and was located to the left side of the chest. He has was sitting when this occurred. He has a h/o similar sxs for the last several years. Denies pleuritic pain. Denies leg swelling, hemoptysis, recent surgery/trauma, recent long travel, hormone use, personal hx of cancer, or hx of DVT/PE. States he ran out of his BP meds a few years ago. Does not have a PCP.  He denies any known COVID contacts.  Past Medical History:  Diagnosis Date  . Abscess, perirectal, x2  05/18/2013  . Dilated cardiomyopathy ([place]) 05/18/2013   By catheterization 2014   . Hypertension     Patient Active Problem List   Diagnosis Date Noted  . Tobacco abuse 05/18/2013  . Dilated cardiomyopathy ([place]) 05/18/2013  . Abscess, perirectal, x2  05/18/2013  . Chest pain at rest 11/20/2012  . Hypertension 11/20/2012    Past Surgical History:  Procedure Laterality Date  . LEFT AND RIGHT HEART CATHETERIZATION WITH CORONARY ANGIOGRAM N/A 11/21/2012   Procedure: LEFT AND RIGHT HEART CATHETERIZATION WITH CORONARY ANGIOGRAM;  Surgeon: Birdie Riddle, MD;  Location: [facility] CATH LAB;  Service: Cardiovascular;  Laterality: N/A;    . MANDIBLE FRACTURE SURGERY  2006       Family History  Problem Relation Age of Onset  . Diabetes Mother   . Hypertension Other     Social History   Tobacco Use  . Smoking status: Current Every Day Smoker    Packs/day: 1.00    Types: Cigarettes  . Smokeless tobacco: Never Used  Substance Use Topics  . Alcohol use: Yes    Comment: occ  . Drug use: No    Home Medications Prior to Admission medications   Medication Sig Start Date End Date Taking? Authorizing Provider  cephALEXin (KEFLEX) 500 MG capsule Take 1 capsule (500 mg total) by mouth 4 (four) times daily for 14 days. 10/25/19 11/08/19  Selen Smucker S, PA-C  ibuprofen (ADVIL,MOTRIN) 400 MG tablet Take 1 tablet (400 mg total) by mouth every 8 (eight) hours as needed for moderate pain. Patient not taking: Reported on 10/25/2019 10/07/18   Ripley Fraise, MD  lisinopril (ZESTRIL) 10 MG tablet Take 1 tablet (10 mg total) by mouth daily. 10/25/19 11/24/19  Yarieliz Wasser S, PA-C  metoprolol tartrate (LOPRESSOR) 50 MG tablet Take 0.5 tablets (25 mg total) by mouth 2 (two) times daily. Patient not taking: Reported on 04/16/2018 11/23/17   Okey Regal, PA-C    Allergies    Shrimp [shellfish allergy]  Review of Systems   Review of Systems  Constitutional: Negative for chills and fever.  HENT: Negative for ear pain  and sore throat.   Eyes: Negative for visual disturbance.  Respiratory: Positive for shortness of breath (resolved). Negative for cough.   Cardiovascular: Positive for chest pain (resolved). Negative for palpitations.  Gastrointestinal: Negative for abdominal pain, constipation, diarrhea, nausea and vomiting.  Genitourinary: Positive for dysuria. Negative for hematuria.  Musculoskeletal: Negative for back pain.  Skin: Negative for rash.  Neurological: Positive for light-headedness and headaches. Negative for dizziness, syncope, weakness and numbness.  All other systems reviewed and are negative.   Physical  Exam Updated Vital Signs BP (!) 150/92   Pulse 75   Temp 99.9 F (37.7 C) (Oral)   Resp 18   Ht 6' 1"  (1.854 m)   Wt 95.3 kg   SpO2 98%   BMI 27.71 kg/m   Physical Exam Vitals and nursing note reviewed.  Constitutional:      Appearance: He is well-developed.  HENT:     Head: Normocephalic and atraumatic.  Eyes:     Extraocular Movements: Extraocular movements intact.     Conjunctiva/sclera: Conjunctivae normal.     Pupils: Pupils are equal, round, and reactive to light.     Comments: No nystagmus  Cardiovascular:     Rate and Rhythm: Regular rhythm. Tachycardia present.     Heart sounds: No murmur.  Pulmonary:     Effort: Pulmonary effort is normal. No respiratory distress.     Breath sounds: Normal breath sounds. No wheezing, rhonchi or rales.  Abdominal:     General: Bowel sounds are normal.     Palpations: Abdomen is soft.     Tenderness: There is no abdominal tenderness. There is no guarding or rebound.  Musculoskeletal:     Cervical back: Neck supple.  Skin:    General: Skin is warm and dry.  Neurological:     Mental Status: He is alert.     Comments: Mental Status:  Alert, thought content appropriate, able to give a coherent history. Speech fluent without evidence of aphasia. Able to follow 2 step commands without difficulty.  Cranial Nerves:  II: pupils equal, round, reactive to light III,IV, VI: ptosis not present, extra-ocular motions intact bilaterally  V,VII: smile symmetric, facial light touch sensation equal VIII: hearing grossly normal to voice  X: uvula elevates symmetrically  XI: bilateral shoulder shrug symmetric and strong XII: midline tongue extension without fassiculations Motor:  Normal tone. 5/5 strength of BUE and BLE major muscle groups including strong and equal grip strength and dorsiflexion/plantar flexion Sensory: light touch normal in all extremities. Cerebellar: normal finger-to-nose with bilateral upper extremities     ED Results  / Procedures / Treatments   Labs (all labs ordered are listed, but only abnormal results are displayed) Labs Reviewed  BASIC METABOLIC PANEL - Abnormal; Notable for the following components:      Result Value   CO2 19 (*)    Glucose, Bld 102 (*)    Creatinine, Ser 1.28 (*)    Calcium 8.7 (*)    All other components within normal limits  CBC - Abnormal; Notable for the following components:   WBC 17.4 (*)    RBC 4.20 (*)    Hemoglobin 12.8 (*)    HCT 38.7 (*)    All other components within normal limits  URINALYSIS, ROUTINE W REFLEX MICROSCOPIC - Abnormal; Notable for the following components:   Nitrite POSITIVE (*)    Leukocytes,Ua SMALL (*)    Bacteria, UA FEW (*)    All other components within normal limits  CBG  MONITORING, ED - Abnormal; Notable for the following components:   Glucose-Capillary 114 (*)    All other components within normal limits  URINE CULTURE  SARS CORONAVIRUS 2 (TAT 6-24 HRS)  HEPATIC FUNCTION PANEL  LACTIC ACID, PLASMA  LACTIC ACID, PLASMA  POC SARS CORONAVIRUS 2 AG -  ED  TROPONIN I (HIGH SENSITIVITY)    EKG EKG Interpretation  Date/Time:  Thursday October 25 2019 12:10:03 EST Ventricular Rate:  109 PR Interval:    QRS Duration: 85 QT Interval:  288 QTC Calculation: 388 R Axis:   82 Text Interpretation: Sinus tachycardia Borderline repolarization abnormality T wave abnormality Abnormal ECG Confirmed by Carmin Muskrat [phone]) on 10/25/2019 3:31:24 PM   Radiology DG Chest Portable 1 View  Result Date: 10/25/2019 CLINICAL DATA:  Shortness of breath. Additional history provided: Dizziness, feelings of warmth EXAM: PORTABLE CHEST 1 VIEW COMPARISON:  Chest radiograph 10/07/2018 FINDINGS: Heart size within normal limits. There is no airspace consolidation within the lungs. No evidence of pleural effusion or pneumothorax. No acute bony abnormality. IMPRESSION: No evidence of acute cardiopulmonary abnormality. Electronically Signed   By: Kellie Simmering DO    On: 10/25/2019 15:20    Procedures Procedures (including critical care time)  Medications Ordered in ED Medications  sodium chloride flush (NS) 0.9 % injection 3 mL (3 mLs Intravenous Given 10/25/19 1507)  sodium chloride 0.9 % bolus 1,000 mL (0 mLs Intravenous Stopped 10/25/19 1622)  acetaminophen (TYLENOL) tablet 650 mg (650 mg Oral Given 10/25/19 1515)  cefTRIAXone (ROCEPHIN) 1 g in sodium chloride 0.9 % 100 mL IVPB (1 g Intravenous New Bag/Given 10/25/19 1631)    ED Course  I have reviewed the triage vital signs and the nursing notes.  Pertinent labs & imaging results that were available during my care of the patient were reviewed by me and considered in my medical decision making (see chart for details).    MDM Rules/Calculators/A&P                      34 y/o male presenting for evaluation of lightheadedness for the last several days.   On arrival he was found to be febrile with marginal tachycardia. He has some htn (h/o same, med noncompliance). O2 sats wnl.  Orthostatic VS are positive. Will give IVF.   Labs reviewed CBC with leukocytosis at 17k, anemia present which is mild. BMP with mildly low bicarb, slightly elevated Cr which is actually improved from prior. Normal electrolytes. Liver enzymes wnl Lactic acid normal UA with leukocytes, nitrites, 0-5 RBCs, 11-20 WBC and few bacteria. Consistent with UTI. Urine culture sent.  - ceftriaxone given in the ED. POC COVID negative Send out COVID pending   EKG reviewed which showed sinus tachycardia with borderline reporalization abnormality  CXR personally reviewed and is without evidence of acute cardiopulmonary abnormality at this time.   Pt presenting with lightheadedness. Found to be febrile and to have UTI. Given abx in the ED. On reassessment, he is feeling improved. He states he no longer feels lightheaded. No evidence of sepsis. Offered admission vs d/c with abx and pt would like to be discharged. Given fevers, will tx  for pyelonephritis. Will give urology f/u and d/c with abx. Will also refill his bp meds and give him pcp f/u. No sxs to suggest htn emergency at this time. Advised on return precautions. He voices understanding of the plan and reasons to return. All questions answered, pt stable for d/c.   ---  Frederico Hamman  Cantera was evaluated in Emergency Department on 10/25/2019 for the symptoms described in the history of present illness. He was evaluated in the context of the global COVID-19 pandemic, which necessitated consideration that the patient might be at risk for infection with the SARS-CoV-2 virus that causes COVID-19. Institutional protocols and algorithms that pertain to the evaluation of patients at risk for COVID-19 are in a state of rapid change based on information released by regulatory bodies including the CDC and federal and state organizations. These policies and algorithms were followed during the patient's care in the ED.   Final Clinical Impression(s) / ED Diagnoses Final diagnoses:  Acute cystitis without hematuria  Hypertension, unspecified type    Rx / DC Orders ED Discharge Orders         Ordered    lisinopril (ZESTRIL) 10 MG tablet  Daily     10/25/19 1716    cephALEXin (KEFLEX) 500 MG capsule  4 times daily     10/25/19 1716           Rodney Booze, PA-C 10/25/19 1719    Carmin Muskrat, MD 10/26/19 [phone]

## 2019-11-06 ENCOUNTER — Ambulatory Visit: Payer: Self-pay | Attending: Family Medicine | Admitting: Family Medicine

## 2019-11-06 ENCOUNTER — Other Ambulatory Visit: Payer: Self-pay

## 2019-11-06 ENCOUNTER — Encounter: Payer: Self-pay | Admitting: Family Medicine

## 2019-11-06 VITALS — BP 127/88 | HR 92 | Ht 73.0 in | Wt 219.0 lb

## 2019-11-06 DIAGNOSIS — N3000 Acute cystitis without hematuria: Secondary | ICD-10-CM

## 2019-11-06 DIAGNOSIS — F1010 Alcohol abuse, uncomplicated: Secondary | ICD-10-CM

## 2019-11-06 DIAGNOSIS — I420 Dilated cardiomyopathy: Secondary | ICD-10-CM

## 2019-11-06 NOTE — Patient Instructions (Signed)
Alcohol Intoxication Alcohol intoxication happens when you cannot think clearly or function well (get impaired) after drinking alcohol. This can happen after just one drink. The effect that alcohol has on how you think and function depends on:  How much alcohol you drank.  Your age, your weight, and whether you are a man or a woman.  How often you drink alcohol.  If you have other medical problems. Alcohol intoxication can range from mild to very bad. It can be dangerous, especially if you:  Drink a large amount of alcohol in a short time (binge drink). ? For women, binge drinking is having four or more drinks at one time. ? For men, binge drinking is having five or more drinks at one time.  Take certain drugs or medicines. If you or anyone around you seems intoxicated:  Tell someone.  Get help from someone. Follow these instructions at home: Eating and drinking   Ask your doctor if alcohol is safe for you. ? If your doctor says that alcohol is safe for you, limit how much you drink to no more than 1 drink a day for women who are not pregnant and 2 drinks a day for men. One drink equals one of these:  12 oz of beer.  5 oz of wine.  1 oz of hard liquor. ? Do not drink alcohol if:  Your doctor tells you not to drink.  You are pregnant, may be pregnant, or are planning to get pregnant.  You are under the legal drinking age (36 years old in the [place]  You are taking medicines that you should not take with alcohol.  Alcohol causes your medical problem to get worse.  You have to drive or do activities that need you to be alert.  You have substance use disorder. This is when using alcohol again and again causes problems with your health, your relationships, or with what you need to do at work, home, or school. ? Be sure to eat before you drink alcohol. Avoid drinking when you have an empty stomach. ? Make sure you have enough fluid in your body (stay hydrated). To do  this:  Drink enough fluid to keep your pee (urine) pale yellow.  Avoid caffeine, which may be in coffee, tea, and some sodas. Caffeine can make you thirsty. ? Try not to drink more than one drink an hour. ? If you are having more than one drink, have a drink without alcohol (such as water) between your drinks. General instructions   Take over-the-counter and prescription medicines only as told by your doctor.  Do not drive after drinking any amount of alcohol. Plan for a designated driver or another way to go home.  Have someone you trust stay with you while you are intoxicated. Youshould not be left alone.  Keep all follow-up visits as told by your doctor. This is important. Contact a doctor if:  You do not feel better after a few days.  You have problems at work, at school, or at home due to drinking. Get help right away if:  You have any of the following: ? Moderate or very bad trouble with:  Movement (coordination).  Talking.  Memory.  Paying attention to things. ? Trouble staying awake. ? Being very confused. ? Jerky movements that you cannot control (seizure). ? Light-headedness. ? Fainting. ? Throwing up (vomiting) blood. The blood may be bright red or look like coffee grounds. ? Blood in your poop (stool). The blood may:  Be bright red.  Make your poop black and tarry and make it smell bad. ? Feeling shaky when you try to stop drinking. ? Thoughts about hurting yourself or others. If you ever feel like you may hurt yourself or others, or have thoughts about taking your own life, get help right away. You can go to your nearest emergency department or call:  Your local emergency services (911 in the [place]  A suicide crisis helpline, such as the [facility] at 838-029-8983. This is open 24 hours a day. Summary  Alcohol intoxication happens when you cannot think clearly or function well (get impaired) after drinking alcohol. This  can happen after just one drink.  If your doctor says that alcohol is safe for you, limit how much you drink to no more than 1 drink a day for women who are not pregnant and 2 drinks a day for men.  Contact a doctor if you have problems at work, at school, or at home due to drinking.  Get help right away if you have thoughts about hurting yourself or others. This information is not intended to replace advice given to you by your health care provider. Make sure you discuss any questions you have with your health care provider. Document Revised: 01/10/2019 Document Reviewed: 11/29/2017 Elsevier Patient Education  [place].

## 2019-11-06 NOTE — Progress Notes (Signed)
Subjective:  Patient ID: Shawn [name], male    DOB: Oct 30, 1985  Age: 34 y.o. MRN: [id number]  CC: Hospitalization Follow-up and Hypertension   HPI Shawn [name] is a 34 year old male with a history of dilated cardiomyopathy (diagnosed in 2014 per epic notes via cardiac cath), alcohol abuse who presents today to establish care. He was treated for acute cystitis at the ED on 10/23/2018 and placed on Keflex. His urinary symptoms have improved.  Drinks alcohol mostly on the weekends-about half a gallon per week and then he states he drinks until he passes out. Denies chest pain, dyspnea, PND, pedal edema and is currently not under cardiology care for his dilated cardiomyopathy.  Past Medical History:  Diagnosis Date  . Abscess, perirectal, x2  05/18/2013  . Dilated cardiomyopathy ([place]) 05/18/2013   By catheterization 2014   . Hypertension     Past Surgical History:  Procedure Laterality Date  . LEFT AND RIGHT HEART CATHETERIZATION WITH CORONARY ANGIOGRAM N/A 11/21/2012   Procedure: LEFT AND RIGHT HEART CATHETERIZATION WITH CORONARY ANGIOGRAM;  Surgeon: Birdie Riddle, MD;  Location: [facility]ATH [facility];  Service: Cardiovascular;  Laterality: N/A;  . MANDIBLE FRACTURE SURGERY  2006    Family History  Problem Relation Age of Onset  . Diabetes Mother   . Hypertension Other     Allergies  Allergen Reactions  . Shrimp [Shellfish Allergy] Shortness Of Breath    Outpatient Medications Prior to Visit  Medication Sig Dispense Refill  . cephALEXin (KEFLEX) 500 MG capsule Take 1 capsule (500 mg total) by mouth 4 (four) times daily for 14 days. 56 capsule 0  . lisinopril (ZESTRIL) 10 MG tablet Take 1 tablet (10 mg total) by mouth daily. 30 tablet 0  . ibuprofen (ADVIL,MOTRIN) 400 MG tablet Take 1 tablet (400 mg total) by mouth every 8 (eight) hours as needed for moderate pain. (Patient not taking: Reported on 10/25/2019) 21 tablet 0  . metoprolol tartrate (LOPRESSOR) 50 MG tablet Take 0.5 tablets  (25 mg total) by mouth 2 (two) times daily. (Patient not taking: Reported on 04/16/2018) 60 tablet 0   No facility-administered medications prior to visit.     ROS Review of Systems  Constitutional: Negative for activity change and appetite change.  HENT: Negative for sinus pressure and sore throat.   Eyes: Negative for visual disturbance.  Respiratory: Negative for cough, chest tightness and shortness of breath.   Cardiovascular: Negative for chest pain and leg swelling.  Gastrointestinal: Negative for abdominal distention, abdominal pain, constipation and diarrhea.  Endocrine: Negative.   Genitourinary: Negative for dysuria.  Musculoskeletal: Negative for joint swelling and myalgias.  Skin: Negative for rash.  Allergic/Immunologic: Negative.   Neurological: Negative for weakness, light-headedness and numbness.  Psychiatric/Behavioral: Negative for dysphoric mood and suicidal ideas.    Objective:  BP 127/88   Pulse 92   Ht 6' 1"  (1.854 m)   Wt 219 lb (99.3 kg)   SpO2 99%   BMI 28.89 kg/m   BP/Weight 11/06/2019 10/25/2019 3/50/0938  Systolic BP 182 993 716  Diastolic BP 88 90 967  Wt. (Lbs) 219 210 215  BMI 28.89 27.71 29.99  Some encounter information is confidential and restricted. Go to Review Flowsheets activity to see all data.      Physical Exam Constitutional:      Appearance: He is well-developed.  Neck:     Vascular: No JVD.  Cardiovascular:     Rate and Rhythm: Normal rate.     Heart sounds: Normal  heart sounds. No murmur.  Pulmonary:     Effort: Pulmonary effort is normal.     Breath sounds: Normal breath sounds. No wheezing or rales.  Chest:     Chest wall: No tenderness.  Abdominal:     General: Bowel sounds are normal. There is no distension.     Palpations: Abdomen is soft. There is no mass.     Tenderness: There is no abdominal tenderness.  Musculoskeletal:        General: Normal range of motion.     Right lower leg: No edema.     Left lower  leg: No edema.  Neurological:     Mental Status: He is alert and oriented to person, place, and time.  Psychiatric:        Mood and Affect: Mood normal.     CMP Latest Ref Rng & Units 10/25/2019 10/07/2018 01/10/2014  Glucose 70 - 99 mg/dL 102(H) 127(H) 102(H)  BUN 6 - 20 mg/dL 16 21(H) 14  Creatinine 0.61 - 1.24 mg/dL 1.28(H) 1.48(H) 0.92  Sodium 135 - 145 mmol/L 138 139 141  Potassium 3.5 - 5.1 mmol/L 4.4 3.5 4.0  Chloride 98 - 111 mmol/L 111 113(H) 104  CO2 22 - 32 mmol/L 19(L) 14(L) 22  Calcium 8.9 - 10.3 mg/dL 8.7(L) 8.3(L) 8.5  Total Protein 6.5 - 8.1 g/dL 8.0 8.3(H) 7.2  Total Bilirubin 0.3 - 1.2 mg/dL 0.5 1.0 0.4  Alkaline Phos 38 - 126 U/L 95 102 122(H)  AST 15 - 41 U/L 19 24 22   ALT 0 - 44 U/L 28 29 27     Lipid Panel     Component Value Date/Time   CHOL 88 11/20/2012 0439   TRIG 31 11/20/2012 0439   HDL 28 (L) 11/20/2012 0439   CHOLHDL 3.1 11/20/2012 0439   VLDL 6 11/20/2012 0439   LDLCALC 54 11/20/2012 0439    CBC    Component Value Date/Time   WBC 17.4 (H) 10/25/2019 1435   RBC 4.20 (L) 10/25/2019 1435   HGB 12.8 (L) 10/25/2019 1435   HCT 38.7 (L) 10/25/2019 1435   PLT 347 10/25/2019 1435   MCV 92.1 10/25/2019 1435   MCH 30.5 10/25/2019 1435   MCHC 33.1 10/25/2019 1435   RDW 14.7 10/25/2019 1435   LYMPHSABS 2.2 07/09/2013 1630   MONOABS 0.4 07/09/2013 1630   EOSABS 0.4 07/09/2013 1630   BASOSABS 0.1 07/09/2013 1630    No results found for: HGBA1C  Assessment & Plan:  1. Alcohol abuse Counseled on detrimental effects of alcohol abuse and advised to work on quitting  2. 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 Acute cystitis without hematuria Asymptomatic Complete course of antibiotic      Charlott Rakes, MD,  FAAFP. [facility] and [place], [place]   11/06/2019, 12:00 PM

## 2019-11-14 ENCOUNTER — Ambulatory Visit: Payer: Self-pay

## 2019-12-19 ENCOUNTER — Ambulatory Visit: Payer: Self-pay | Attending: Family Medicine | Admitting: Family Medicine

## 2019-12-19 ENCOUNTER — Other Ambulatory Visit: Payer: Self-pay

## 2019-12-26 ENCOUNTER — Encounter (HOSPITAL_COMMUNITY): Payer: Self-pay | Admitting: Emergency Medicine

## 2019-12-26 ENCOUNTER — Other Ambulatory Visit: Payer: Self-pay

## 2019-12-26 ENCOUNTER — Emergency Department (HOSPITAL_COMMUNITY)
Admission: EM | Admit: 2019-12-26 | Discharge: 2019-12-26 | Disposition: A | Discharge status: Home / Self Care | Payer: Self-pay | Attending: Emergency Medicine | Admitting: Emergency Medicine

## 2019-12-26 DIAGNOSIS — H5310 Unspecified subjective visual disturbances: Secondary | ICD-10-CM | POA: Insufficient documentation

## 2019-12-26 DIAGNOSIS — I10 Essential (primary) hypertension: Secondary | ICD-10-CM | POA: Insufficient documentation

## 2019-12-26 DIAGNOSIS — F17210 Nicotine dependence, cigarettes, uncomplicated: Secondary | ICD-10-CM | POA: Insufficient documentation

## 2019-12-26 MED ORDER — IBUPROFEN 400 MG PO TABS
600.0000 mg | ORAL_TABLET | Freq: Once | ORAL | Status: AC
  Administered 2019-12-26: 600 mg via ORAL
  Filled 2019-12-26: qty 1

## 2019-12-26 NOTE — Discharge Instructions (Addendum)
  Antiinflammatory medications: Take 600 mg of ibuprofen every 6 hours or 440 mg (over the counter dose) to 500 mg (prescription dose) of naproxen every 12 hours for the next 3 days. After this time, these medications may be used as needed for pain. Take these medications with food to avoid upset stomach. Choose only one of these medications, do not take them together. Acetaminophen (generic for Tylenol): Should you continue to have additional pain while taking the ibuprofen or naproxen, you may add in acetaminophen as needed. Your daily total maximum amount of acetaminophen from all sources should be limited to 4028m/day for persons without liver problems, or 20049mday for those with liver problems.  Follow-up: Follow-up with the eye specialist as soon as possible on this matter. Return: Return to the emergency department for worsening symptoms, dizziness, pain with eye movements, vision changes, numbness, weakness, or any other major concerns.

## 2019-12-26 NOTE — ED Triage Notes (Signed)
Patient c/o headache and left eye pain since last Tuesday. Reports a straining feeling to left eye. Has appt with opthalmologist for next Wednesday.

## 2019-12-26 NOTE — ED Notes (Signed)
Pt discharged from ED in NAD

## 2019-12-26 NOTE — ED Provider Notes (Signed)
[facility] EMERGENCY DEPARTMENT Provider Note   CSN: [id number] Arrival date & time: 12/26/19  1309     History Chief Complaint  Patient presents with  . Headache  . Eye Pain    Shawn [name] is a 34 y.o. male.  HPI      Shawn [name] is a 34 y.o. male, with a history of HTN, presenting to the ED with headaches intermittent over the last week and a half. He also notes a feeling of strain to the left eye.  His symptoms are not worsening.  He frequently spends most of his day working on a computer.  He also states that over the past several months he has suspected he needs glasses because of more frequent eyestrain and some changes in visual acuity bilaterally. He states he has an appointment to see an optometrist next week. Denies fever/chills, nausea/vomiting, acute vision abnormalities, numbness, weakness, facial droop, trauma, dizziness, pain with EOMs, or any other complaints.     Past Medical History:  Diagnosis Date  . Abscess, perirectal, x2  05/18/2013  . Dilated cardiomyopathy ([place]) 05/18/2013   By catheterization 2014   . Hypertension     Patient Active Problem List   Diagnosis Date Noted  . Alcohol abuse 11/06/2019  . Tobacco abuse 05/18/2013  . Dilated cardiomyopathy ([place]) 05/18/2013  . Abscess, perirectal, x2  05/18/2013  . Chest pain at rest 11/20/2012  . Hypertension 11/20/2012    Past Surgical History:  Procedure Laterality Date  . LEFT AND RIGHT HEART CATHETERIZATION WITH CORONARY ANGIOGRAM N/A 11/21/2012   Procedure: LEFT AND RIGHT HEART CATHETERIZATION WITH CORONARY ANGIOGRAM;  Surgeon: Birdie Riddle, MD;  Location: [facility] CATH LAB;  Service: Cardiovascular;  Laterality: N/A;  . MANDIBLE FRACTURE SURGERY  2006       Family History  Problem Relation Age of Onset  . Diabetes Mother   . Hypertension Other     Social History   Tobacco Use  . Smoking status: Current Every Day Smoker    Packs/day: 1.00    Types: Cigarettes    . Smokeless tobacco: Never Used  Substance Use Topics  . Alcohol use: Yes    Comment: occ  . Drug use: No    Home Medications Prior to Admission medications   Medication Sig Start Date End Date Taking? Authorizing Provider  ibuprofen (ADVIL,MOTRIN) 400 MG tablet Take 1 tablet (400 mg total) by mouth every 8 (eight) hours as needed for moderate pain. Patient not taking: Reported on 10/25/2019 10/07/18   Ripley Fraise, MD  lisinopril (ZESTRIL) 10 MG tablet Take 1 tablet (10 mg total) by mouth daily. 10/25/19 11/24/19  Couture, Cortni S, PA-C  metoprolol tartrate (LOPRESSOR) 50 MG tablet Take 0.5 tablets (25 mg total) by mouth 2 (two) times daily. Patient not taking: Reported on 04/16/2018 11/23/17   Okey Regal, PA-C    Allergies    Shrimp [shellfish allergy]  Review of Systems   Review of Systems  Constitutional: Negative for chills, diaphoresis and fever.  HENT: Negative for facial swelling.   Eyes: Negative for photophobia, pain and visual disturbance.  Gastrointestinal: Negative for nausea and vomiting.  Musculoskeletal: Negative for neck pain.  Neurological: Positive for headaches. Negative for dizziness, syncope, weakness and numbness.  All other systems reviewed and are negative.   Physical Exam Updated Vital Signs BP (!) 137/95 (BP Location: Left Arm)   Pulse 86   Temp 98.7 F (37.1 C) (Oral)   Resp 16   Ht 5'  11" (1.803 m)   Wt 103.9 kg   SpO2 100%   BMI 31.94 kg/m   Physical Exam Vitals and nursing note reviewed.  Constitutional:      General: He is not in acute distress.    Appearance: He is well-developed. He is not diaphoretic.  HENT:     Head: Normocephalic and atraumatic.     Nose: Nose normal.     Mouth/Throat:     Mouth: Mucous membranes are moist.     Pharynx: Oropharynx is clear.  Eyes:     Extraocular Movements: Extraocular movements intact.     Conjunctiva/sclera: Conjunctivae normal.     Pupils: Pupils are equal, round, and reactive to  light.     Comments: No contact lenses in place.  EOMs are intact and in sync.  No pain with EOMs.  No noted eye or lid ptosis.  Pupils are PERRL.  No hyphema.  Iris appears to be round and without deformity.    Visual Acuity  Right Eye Distance: 20/50 Left Eye Distance: 20/40 Bilateral Distance: 20/25  Right Eye Near: R Near: 20/25 Left Eye Near:  L Near: 20/16 Bilateral Near:  20/25    Cardiovascular:     Rate and Rhythm: Normal rate and regular rhythm.     Pulses: Normal pulses.     Heart sounds: Normal heart sounds.  Pulmonary:     Effort: Pulmonary effort is normal. No respiratory distress.     Breath sounds: Normal breath sounds.  Abdominal:     Palpations: Abdomen is soft.     Tenderness: There is no abdominal tenderness. There is no guarding.  Musculoskeletal:     Cervical back: Neck supple.  Lymphadenopathy:     Cervical: No cervical adenopathy.  Skin:    General: Skin is warm and dry.  Neurological:     Mental Status: He is alert and oriented to person, place, and time.     Comments: No noted acute cognitive deficit. Sensation grossly intact to light touch in the extremities.   Grip strengths equal bilaterally.   Strength 5/5 in all extremities.  No gait disturbance.  Coordination intact.  Cranial nerves III-XII grossly intact.  Handles oral secretions without noted difficulty.  No noted phonation or speech deficit. No facial droop.   Psychiatric:        Mood and Affect: Mood and affect normal.        Speech: Speech normal.        Behavior: Behavior normal.     ED Results / Procedures / Treatments   Labs (all labs ordered are listed, but only abnormal results are displayed) Labs Reviewed - No data to display  EKG None  Radiology No results found.  Procedures Procedures (including critical care time)  Medications Ordered in ED Medications  ibuprofen (ADVIL) tablet 600 mg (600 mg Oral Given 12/26/19 1611)    ED Course  I have reviewed the  triage vital signs and the nursing notes.  Pertinent labs & imaging results that were available during my care of the patient were reviewed by me and considered in my medical decision making (see chart for details).    MDM Rules/Calculators/A&P                      Patient presents with intermittent headache and feeling of left eye strain.  He has no abnormalities noted on exam.  No focal neurologic deficits.  No pain with EOMs.   I discussed  options for work-up with the patient, including CT scan, further testing, and contact with the on-call ophthalmologist.  Patient opted for trying a course of NSAIDs and following up with the optometrist with whom he has an appointment scheduled for next week.   Final Clinical Impression(s) / ED Diagnoses Final diagnoses:  Eye strain, left    Rx / DC Orders ED Discharge Orders    None       Layla Maw 12/26/19 1856    Wyvonnia Dusky, MD 12/26/19 2051

## 2020-01-04 ENCOUNTER — Encounter (HOSPITAL_COMMUNITY): Payer: Self-pay

## 2020-01-04 ENCOUNTER — Emergency Department (HOSPITAL_COMMUNITY): Payer: Self-pay

## 2020-01-04 ENCOUNTER — Emergency Department (HOSPITAL_COMMUNITY)
Admission: EM | Admit: 2020-01-04 | Discharge: 2020-01-05 | Disposition: A | Discharge status: Home / Self Care | Payer: Self-pay | Attending: Emergency Medicine | Admitting: Emergency Medicine

## 2020-01-04 ENCOUNTER — Other Ambulatory Visit: Payer: Self-pay

## 2020-01-04 DIAGNOSIS — R519 Headache, unspecified: Secondary | ICD-10-CM | POA: Insufficient documentation

## 2020-01-04 DIAGNOSIS — Z5321 Procedure and treatment not carried out due to patient leaving prior to being seen by health care provider: Secondary | ICD-10-CM | POA: Insufficient documentation

## 2020-01-04 LAB — COMPREHENSIVE METABOLIC PANEL
ALT: 26 U/L (ref 0–44)
AST: 20 U/L (ref 15–41)
Albumin: 3.1 g/dL — ABNORMAL LOW (ref 3.5–5.0)
Alkaline Phosphatase: 103 U/L (ref 38–126)
Anion gap: 11 (ref 5–15)
BUN: 11 mg/dL (ref 6–20)
CO2: 20 mmol/L — ABNORMAL LOW (ref 22–32)
Calcium: 8.3 mg/dL — ABNORMAL LOW (ref 8.9–10.3)
Chloride: 104 mmol/L (ref 98–111)
Creatinine, Ser: 1.27 mg/dL — ABNORMAL HIGH (ref 0.61–1.24)
GFR calc Af Amer: >60 mL/min (ref >60)
GFR calc non Af Amer: >60 mL/min (ref >60)
Glucose, Bld: 115 mg/dL — ABNORMAL HIGH (ref 70–99)
Potassium: 3.8 mmol/L (ref 3.5–5.1)
Sodium: 135 mmol/L (ref 135–145)
Total Bilirubin: 0.7 mg/dL (ref 0.3–1.2)
Total Protein: 7.2 g/dL (ref 6.5–8.1)

## 2020-01-04 LAB — CBC
HCT: 31.3 % — ABNORMAL LOW (ref 39.0–52.0)
Hemoglobin: 10.4 g/dL — ABNORMAL LOW (ref 13.0–17.0)
MCH: 30.6 pg (ref 26.0–34.0)
MCHC: 33.2 g/dL (ref 30.0–36.0)
MCV: 92.1 fL (ref 80.0–100.0)
Platelets: 335 10*3/uL (ref 150–400)
RBC: 3.4 MIL/uL — ABNORMAL LOW (ref 4.22–5.81)
RDW: 15.5 % (ref 11.5–15.5)
WBC: 9.2 10*3/uL (ref 4.0–10.5)
nRBC: 0 % (ref 0.0–0.2)

## 2020-01-04 IMAGING — CT CT HEAD W/O CM
4 series · 16 of 47 positions shown, 18 images · non-contrast
Comparison: [DATE]

CLINICAL DATA: Headaches for 2 weeks

EXAM:
CT HEAD WITHOUT CONTRAST
TECHNIQUE: Contiguous axial images were obtained from the base of the skull
through the vertex without intravenous contrast.

[Series 3: head without · axial · non-contrast · 0.46mm/px · z∈[-92,+28]mm · 7 of 34 slices shown, 9 images]
[im 5/34  brain]
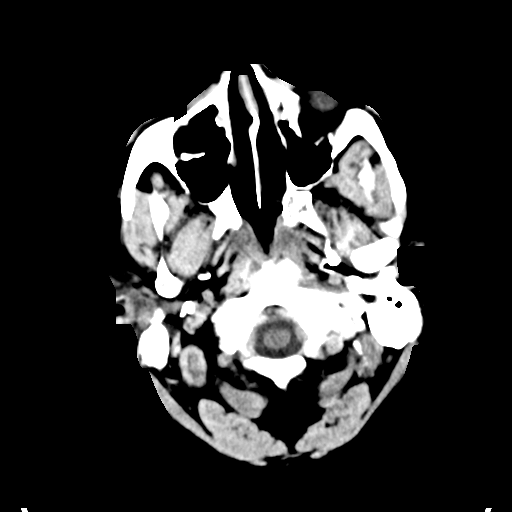
[im 5/34  bone]
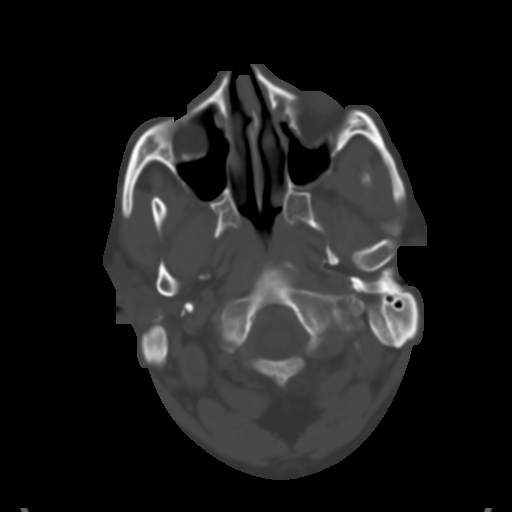
[im 9/34  brain]
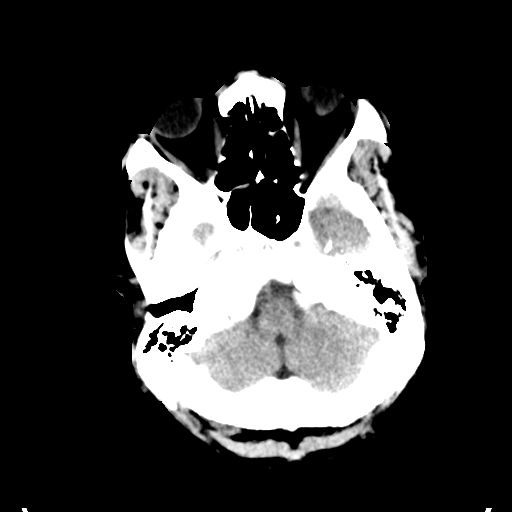
[im 13/34  brain]
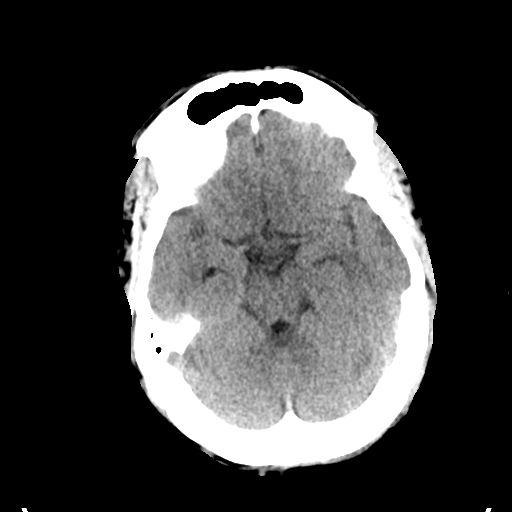
[im 17/34  brain]
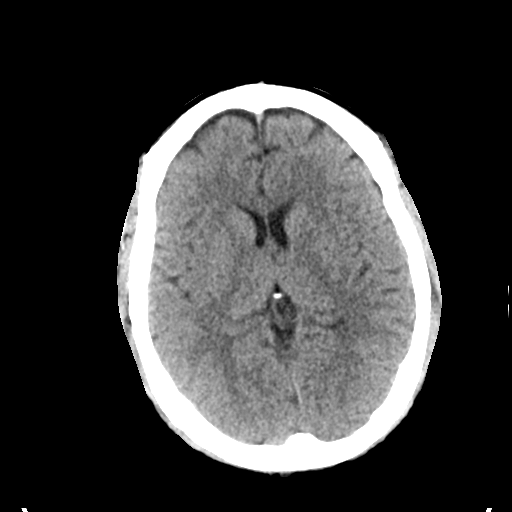
[im 21/34  brain]
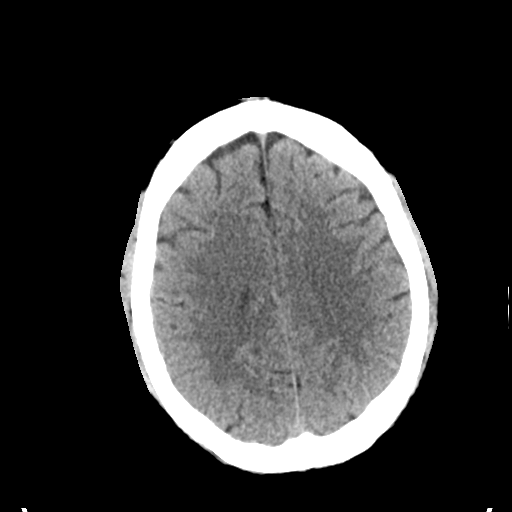
[im 21/34  bone]
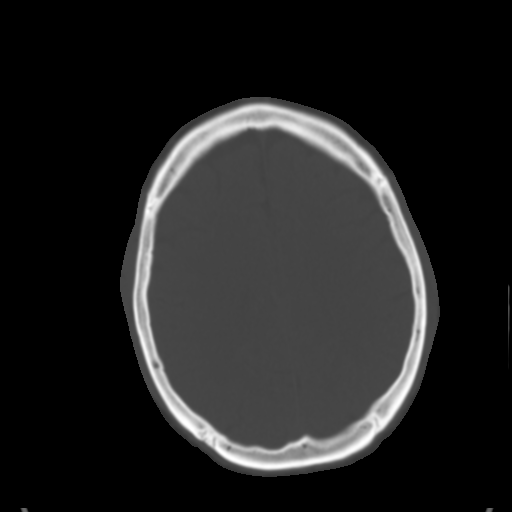
[im 25/34  brain]
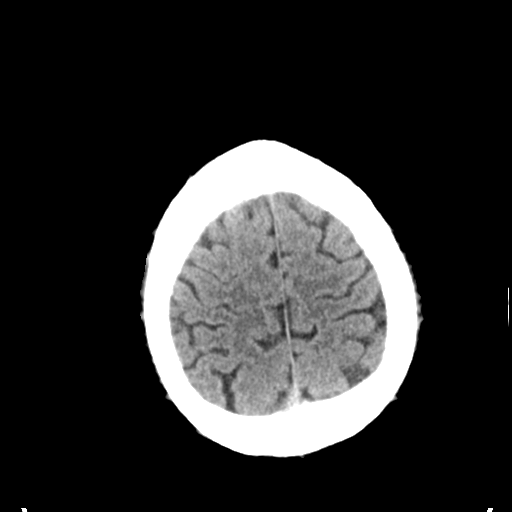
[im 29/34  brain]
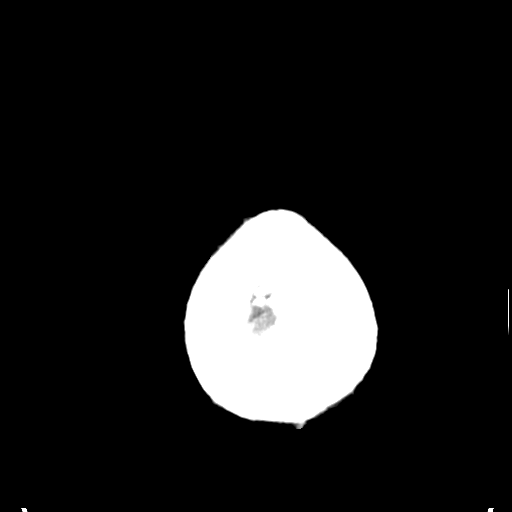

[Series 4: head bone · axial · 0.46mm/px · z∈[-96,-62]mm · 3 of 85 slices shown]
[im 9/85  bone]
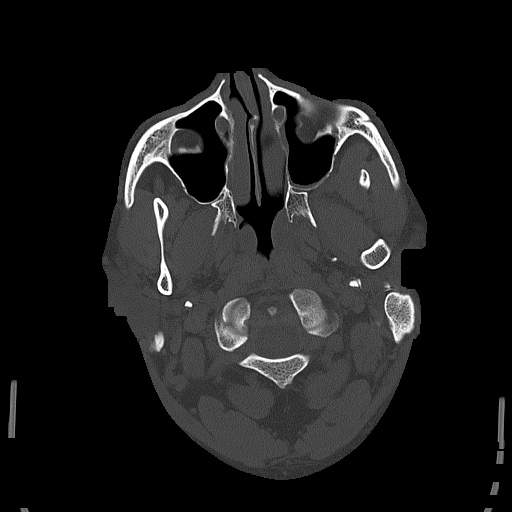
[im 17/85  bone]
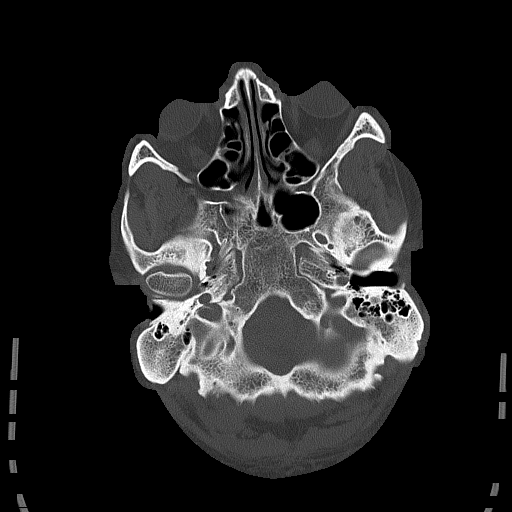
[im 26/85  bone]
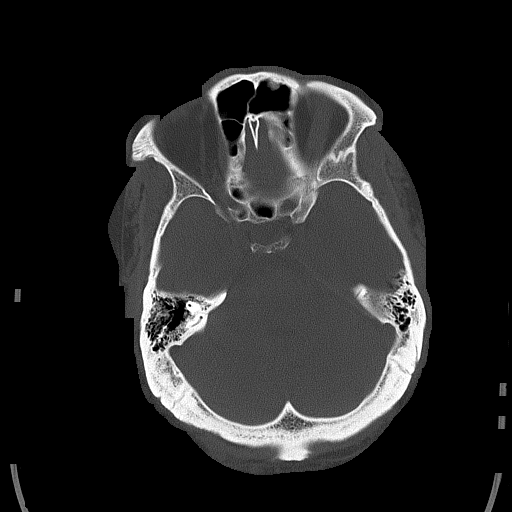

[Series 5: head without cor · coronal · non-contrast · 0.44mm/px · 3 of 74 slices shown]
[im 25/74  brain]
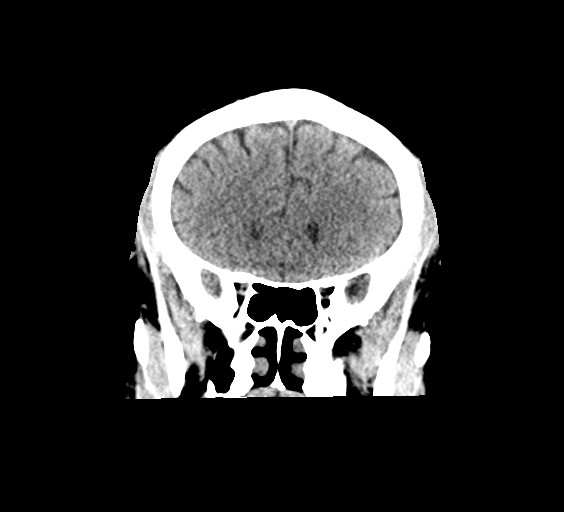
[im 33/74  brain]
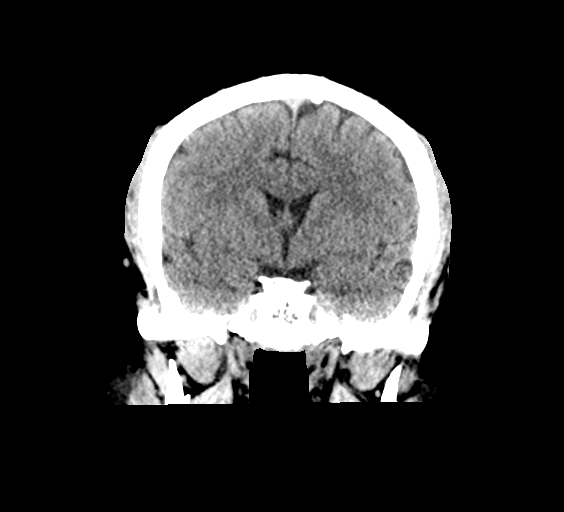
[im 41/74  brain]
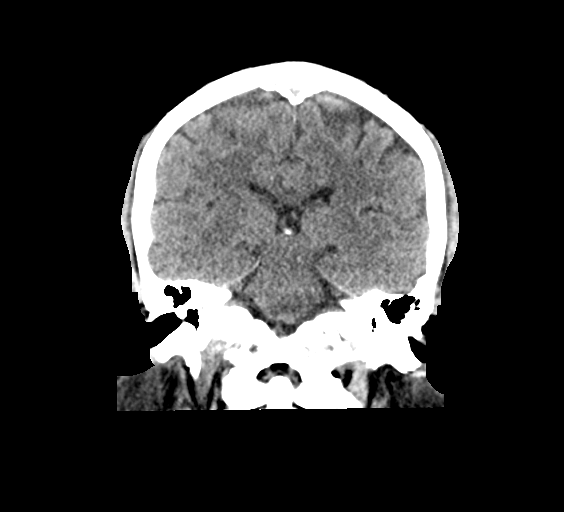

[Series 6: head without sag · sagittal · non-contrast · 0.41mm/px · 3 of 67 slices shown]
[im 23/67  brain]
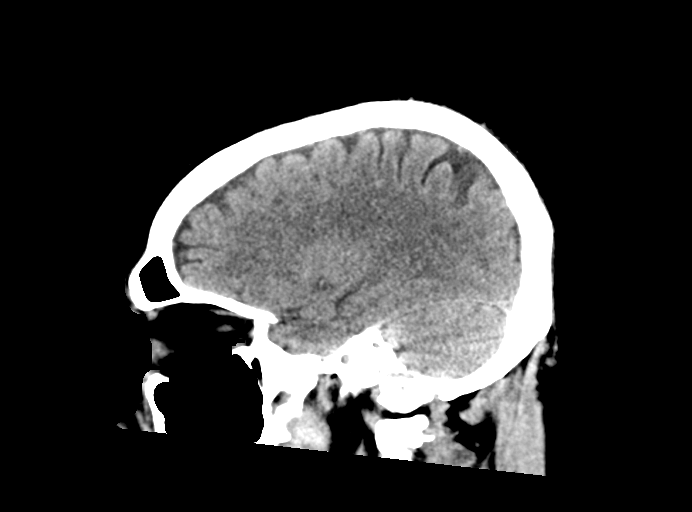
[im 34/67  brain]
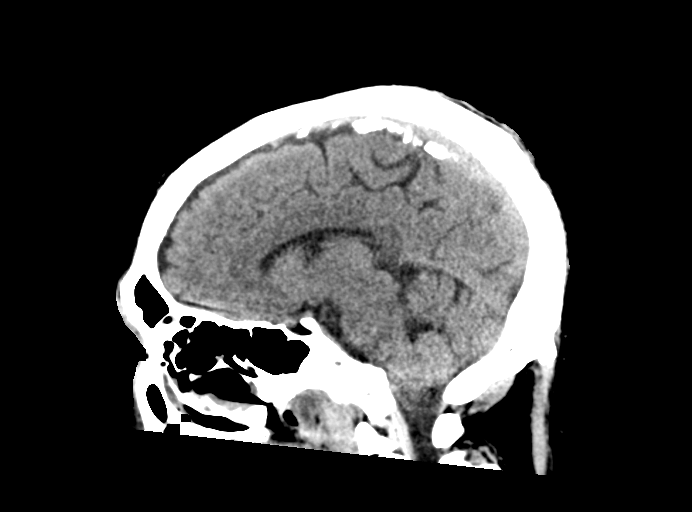
[im 45/67  brain]
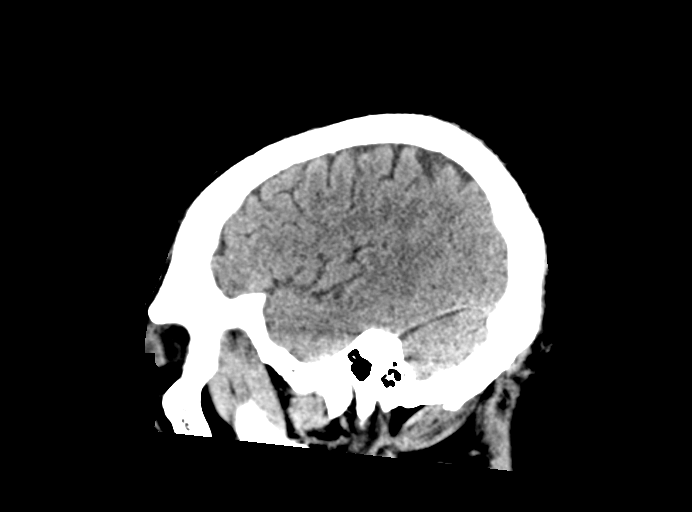

[16 of 47 positions shown; findings below may reference images not displayed]

FINDINGS: Brain: No evidence of acute infarction, hemorrhage, hydrocephalus,
extra-axial collection or mass lesion/mass effect.

Vascular: No hyperdense vessel or unexpected calcification.

Skull: Normal. Negative for fracture or focal lesion.

Sinuses/Orbits: Mild mucosal thickening is noted within the left
maxillary antrum.

Other: None.
IMPRESSION: No acute intracranial abnormality noted.

Mild mucosal thickening in the left maxillary antrum.

## 2020-01-04 NOTE — ED Triage Notes (Signed)
Pt arrives to the ED w/ c/o headache. Pt reports ongoing headache for 2 weeks. Neuro intact.

## 2020-01-08 ENCOUNTER — Emergency Department (HOSPITAL_COMMUNITY)
Admission: EM | Admit: 2020-01-08 | Discharge: 2020-01-08 | Disposition: A | Discharge status: Home / Self Care | Payer: Self-pay | Attending: Emergency Medicine | Admitting: Emergency Medicine

## 2020-01-08 ENCOUNTER — Other Ambulatory Visit: Payer: Self-pay

## 2020-01-08 ENCOUNTER — Encounter (HOSPITAL_COMMUNITY): Payer: Self-pay | Admitting: Emergency Medicine

## 2020-01-08 DIAGNOSIS — H53149 Visual discomfort, unspecified: Secondary | ICD-10-CM | POA: Insufficient documentation

## 2020-01-08 DIAGNOSIS — I10 Essential (primary) hypertension: Secondary | ICD-10-CM | POA: Insufficient documentation

## 2020-01-08 DIAGNOSIS — R519 Headache, unspecified: Secondary | ICD-10-CM | POA: Insufficient documentation

## 2020-01-08 LAB — CBC
HCT: 35.6 % — ABNORMAL LOW (ref 39.0–52.0)
Hemoglobin: 11.7 g/dL — ABNORMAL LOW (ref 13.0–17.0)
MCH: 30.7 pg (ref 26.0–34.0)
MCHC: 32.9 g/dL (ref 30.0–36.0)
MCV: 93.4 fL (ref 80.0–100.0)
Platelets: 379 10*3/uL (ref 150–400)
RBC: 3.81 MIL/uL — ABNORMAL LOW (ref 4.22–5.81)
RDW: 15.9 % — ABNORMAL HIGH (ref 11.5–15.5)
WBC: 10.8 10*3/uL — ABNORMAL HIGH (ref 4.0–10.5)
nRBC: 0 % (ref 0.0–0.2)

## 2020-01-08 LAB — COMPREHENSIVE METABOLIC PANEL
ALT: 23 U/L (ref 0–44)
AST: 17 U/L (ref 15–41)
Albumin: 3.1 g/dL — ABNORMAL LOW (ref 3.5–5.0)
Alkaline Phosphatase: 99 U/L (ref 38–126)
Anion gap: 7 (ref 5–15)
BUN: 11 mg/dL (ref 6–20)
CO2: 23 mmol/L (ref 22–32)
Calcium: 8.4 mg/dL — ABNORMAL LOW (ref 8.9–10.3)
Chloride: 109 mmol/L (ref 98–111)
Creatinine, Ser: 1.21 mg/dL (ref 0.61–1.24)
GFR calc Af Amer: >60 mL/min (ref >60)
GFR calc non Af Amer: >60 mL/min (ref >60)
Glucose, Bld: 91 mg/dL (ref 70–99)
Potassium: 3.9 mmol/L (ref 3.5–5.1)
Sodium: 139 mmol/L (ref 135–145)
Total Bilirubin: 0.2 mg/dL — ABNORMAL LOW (ref 0.3–1.2)
Total Protein: 7.4 g/dL (ref 6.5–8.1)

## 2020-01-08 MED ORDER — SODIUM CHLORIDE 0.9 % IV BOLUS
1000.0000 mL | Freq: Once | INTRAVENOUS | Status: AC
  Administered 2020-01-08: 1000 mL via INTRAVENOUS

## 2020-01-08 MED ORDER — DEXAMETHASONE SODIUM PHOSPHATE 4 MG/ML IJ SOLN
4.0000 mg | Freq: Once | INTRAMUSCULAR | Status: AC
  Administered 2020-01-08: 4 mg via INTRAVENOUS
  Filled 2020-01-08: qty 1

## 2020-01-08 MED ORDER — PROMETHAZINE HCL 25 MG/ML IJ SOLN: 25.0000 mg | Freq: Once | INTRAMUSCULAR | Status: DC

## 2020-01-08 MED ORDER — PROMETHAZINE HCL 25 MG/ML IJ SOLN
12.5000 mg | Freq: Once | INTRAMUSCULAR | Status: AC
  Administered 2020-01-08: 12.5 mg via INTRAVENOUS
  Filled 2020-01-08: qty 1

## 2020-01-08 MED ORDER — DIPHENHYDRAMINE HCL 25 MG PO CAPS
25.0000 mg | ORAL_CAPSULE | Freq: Once | ORAL | Status: AC
  Administered 2020-01-08: 25 mg via ORAL
  Filled 2020-01-08: qty 1

## 2020-01-08 MED ORDER — ACETAMINOPHEN 325 MG PO TABS
650.0000 mg | ORAL_TABLET | Freq: Once | ORAL | Status: AC
  Administered 2020-01-08: 650 mg via ORAL
  Filled 2020-01-08: qty 2

## 2020-01-08 MED ORDER — TETRACAINE HCL 0.5 % OP SOLN
2.0000 [drp] | Freq: Once | OPHTHALMIC | Status: AC
  Administered 2020-01-08: 2 [drp] via OPHTHALMIC
  Filled 2020-01-08: qty 4

## 2020-01-08 NOTE — Discharge Instructions (Addendum)
Today you received medications that may make you sleepy or impair your ability to make decisions.  For the next 24 hours please do not drive, operate heavy machinery, care for a small child with out another adult present, or perform any activities that may cause harm to you or someone else if you were to fall asleep or be impaired.   Please follow up with your primary care doctor and your eye doctor.   Please get your blood pressure rechecked in the next 1 to 2 weeks as it is elevated in the emergency room.  Please start taking either Allegra, Claritin, or Zyrtec once a day.  In addition please start using a nasal steroid spray such as Flonase or Nasacort.  This will help with any sinus inflammation.

## 2020-01-08 NOTE — ED Notes (Addendum)
Pt verbalizes understanding of d/c instructions. Pt ambulatory at d/c with all belongings. Pt has ride waiting for him outside.

## 2020-01-08 NOTE — ED Provider Notes (Signed)
[facility] EMERGENCY DEPARTMENT Provider Note   CSN: [id number] Arrival date & time: 01/08/20  1411     History Chief Complaint  Patient presents with  . Recurrent Sinusitis    Shawn [name] is a 34 y.o. male with a past medical history alcohol abuse, dilated cardiomyopathy, hypertension, who presents today for evaluation of headache.  He reports that he has had a headache for three weeks now.  He has been seen for this and was supposed to see an ophthalmologist for vision evaluation but missed the appointment.  He reports that when his pain gets bad his vision feels off.  He reports that over the past few months he has had worsening eye strain and bilateral vision changes.  He reports the pain waxes and wanes.  No specific aggravating or alleviating factors the pain is around his left eye.  He denies any fevers.  No chiropractic manipulation.  No neck pain.    HPI     Past Medical History:  Diagnosis Date  . Abscess, perirectal, x2  05/18/2013  . Dilated cardiomyopathy ([place]) 05/18/2013   By catheterization 2014   . Hypertension     Patient Active Problem List   Diagnosis Date Noted  . Alcohol abuse 11/06/2019  . Tobacco abuse 05/18/2013  . Dilated cardiomyopathy ([place]) 05/18/2013  . Abscess, perirectal, x2  05/18/2013  . Chest pain at rest 11/20/2012  . Hypertension 11/20/2012    Past Surgical History:  Procedure Laterality Date  . LEFT AND RIGHT HEART CATHETERIZATION WITH CORONARY ANGIOGRAM N/A 11/21/2012   Procedure: LEFT AND RIGHT HEART CATHETERIZATION WITH CORONARY ANGIOGRAM;  Surgeon: Birdie Riddle, MD;  Location: [facility] CATH LAB;  Service: Cardiovascular;  Laterality: N/A;  . MANDIBLE FRACTURE SURGERY  2006       Family History  Problem Relation Age of Onset  . Diabetes Mother   . Hypertension Other     Social History   Tobacco Use  . Smoking status: Current Every Day Smoker    Packs/day: 1.00    Types: Cigarettes  . Smokeless tobacco:  Never Used  Substance Use Topics  . Alcohol use: Yes    Comment: occ  . Drug use: No    Home Medications Prior to Admission medications   Medication Sig Start Date End Date Taking? Authorizing Provider  ibuprofen (ADVIL,MOTRIN) 400 MG tablet Take 1 tablet (400 mg total) by mouth every 8 (eight) hours as needed for moderate pain. 10/07/18  Yes Ripley Fraise, MD  lisinopril (ZESTRIL) 10 MG tablet Take 1 tablet (10 mg total) by mouth daily. 10/25/19 11/24/19  Couture, Cortni S, PA-C  metoprolol tartrate (LOPRESSOR) 50 MG tablet Take 0.5 tablets (25 mg total) by mouth 2 (two) times daily. Patient not taking: Reported on 04/16/2018 11/23/17   Okey Regal, PA-C    Allergies    Shrimp [shellfish allergy]  Review of Systems   Review of Systems  Constitutional: Negative for chills and fever.  HENT: Negative for congestion, facial swelling, sinus pressure, sinus pain and sore throat.   Eyes: Positive for pain.  Respiratory: Negative for cough and shortness of breath.   Gastrointestinal: Negative for abdominal pain and nausea.  Genitourinary: Negative for dysuria.  Musculoskeletal: Negative for back pain.  Neurological: Positive for headaches. Negative for tremors, seizures, speech difficulty and numbness.  All other systems reviewed and are negative.   Physical Exam Updated Vital Signs BP (!) 162/101 (BP Location: Right Arm)   Pulse 78   Temp 98.9 F (  37.2 C) (Oral)   Resp 16   Ht 5' 11"  (1.803 m)   Wt 99.8 kg   SpO2 100%   BMI 30.68 kg/m   Physical Exam Vitals and nursing note reviewed.  Constitutional:      General: He is not in acute distress.    Appearance: He is well-developed. He is not diaphoretic.  HENT:     Head: Normocephalic and atraumatic.     Right Ear: Tympanic membrane, ear canal and external ear normal.     Left Ear: Tympanic membrane, ear canal and external ear normal.     Nose: Nose normal.     Right Sinus: No maxillary sinus tenderness or frontal sinus  tenderness.     Left Sinus: No maxillary sinus tenderness or frontal sinus tenderness.     Mouth/Throat:     Mouth: Mucous membranes are moist.  Eyes:     General: No visual field deficit or scleral icterus.       Right eye: No discharge.        Left eye: No discharge.     Intraocular pressure: Left eye pressure is 17 mmHg. Measurements were taken using a handheld tonometer.    Conjunctiva/sclera: Conjunctivae normal.  Cardiovascular:     Rate and Rhythm: Normal rate and regular rhythm.  Pulmonary:     Effort: Pulmonary effort is normal. No respiratory distress.     Breath sounds: No stridor.  Abdominal:     General: There is no distension.  Musculoskeletal:        General: No deformity.     Cervical back: Normal range of motion and neck supple. No rigidity.  Skin:    General: Skin is warm and dry.  Neurological:     General: No focal deficit present.     Mental Status: He is alert and oriented to person, place, and time.     Cranial Nerves: No cranial nerve deficit.     Sensory: No sensory deficit.     Motor: No weakness or abnormal muscle tone.     Comments: Cranial nerves are grossly intact.  No slurred speech or facial droop.  Full pain free eye ROM with out nystagmus.  5/5 strength in bilateral upper and lower extremities.   Psychiatric:        Mood and Affect: Mood normal.        Behavior: Behavior normal.     ED Results / Procedures / Treatments   Labs (all labs ordered are listed, but only abnormal results are displayed) Labs Reviewed  CBC - Abnormal; Notable for the following components:      Result Value   WBC 10.8 (*)    RBC 3.81 (*)    Hemoglobin 11.7 (*)    HCT 35.6 (*)    RDW 15.9 (*)    All other components within normal limits  COMPREHENSIVE METABOLIC PANEL - Abnormal; Notable for the following components:   Calcium 8.4 (*)    Albumin 3.1 (*)    Total Bilirubin 0.2 (*)    All other components within normal limits    EKG None  Radiology- CT head  obtained at prior ED visit when patient left AMA.  CT HEAD WO CONTRAST  Result Date: 01/04/2020 CLINICAL DATA:  Headaches for 2 weeks EXAM: CT HEAD WITHOUT CONTRAST TECHNIQUE: Contiguous axial images were obtained from the base of the skull through the vertex without intravenous contrast. COMPARISON:  10/07/2018 FINDINGS: Brain: No evidence of acute infarction, hemorrhage, hydrocephalus, extra-axial  collection or mass lesion/mass effect. Vascular: No hyperdense vessel or unexpected calcification. Skull: Normal. Negative for fracture or focal lesion. Sinuses/Orbits: Mild mucosal thickening is noted within the left maxillary antrum. Other: None. IMPRESSION: No acute intracranial abnormality noted. Mild mucosal thickening in the left maxillary antrum. Electronically Signed   By: [name] M.D.   On: 01/04/2020 22:07     Procedures Procedures (including critical care time)  Medications Ordered in ED Medications  tetracaine (PONTOCAINE) 0.5 % ophthalmic solution 2 drop (2 drops Left Eye Given 01/08/20 1755)  sodium chloride 0.9 % bolus 1,000 mL (0 mLs Intravenous Stopped 01/08/20 2001)  dexamethasone (DECADRON) injection 4 mg (4 mg Intravenous Given 01/08/20 1841)  diphenhydrAMINE (BENADRYL) capsule 25 mg (25 mg Oral Given 01/08/20 1841)  acetaminophen (TYLENOL) tablet 650 mg (650 mg Oral Given 01/08/20 1841)  promethazine (PHENERGAN) injection 12.5 mg (12.5 mg Intravenous Given 01/08/20 1842)    ED Course  I have reviewed the triage vital signs and the nursing notes.  Pertinent labs & imaging results that were available during my care of the patient were reviewed by me and considered in my medical decision making (see chart for details).    MDM Rules/Calculators/A&P                     Patient is a 34 year old man who presents today for evaluation of headache over the past 2+ weeks.  His headache is primarily around his left eye.  CT scan from previous visit reviewed without hemorrhage, mass or  other cause for his symptoms other than mild left-sided sinus inflammation.  On exam he is neurologically intact.  He was supposed to follow-up with ophthalmology however failed to do so.  Given that his pain is around the left eye ocular pressure was checked which was 17, not significantly elevated.  His blood pressure is elevated, he is 151/106 while in the emergency room, however this appears to be his baseline and does not appear to be new with this headache, given that he is neurovascularly intact and has had a headache for multiple weeks I do not suspect stroke or hypertensive emergency.  Obtained and reviewed without significant acute hematologic or electrolyte derangements.  He is treated with cocktail after which his headache significantly improved.  Recommended PCP follow-up, in addition to being evaluated for corrective lenses.  Dash diet information given.   Return precautions were discussed with patient who states their understanding.  At the time of discharge patient denied any unaddressed complaints or concerns.  Patient is agreeable for discharge home.  Note: Portions of this report may have been transcribed using voice recognition software. Every effort was made to ensure accuracy; however, inadvertent computerized transcription errors may be present  Final Clinical Impression(s) / ED Diagnoses Final diagnoses:  Chronic nonintractable headache, unspecified headache type  Hypertension, unspecified type    Rx / DC Orders ED Discharge Orders    None       Ollen Gross 01/08/20 2244    Daleen Bo, MD 01/08/20 [phone]

## 2020-01-08 NOTE — ED Triage Notes (Signed)
Pt states he was here for a HA and neck pain 5/14 but LWBS. Came back for medications due to relief with tylenol.

## 2020-01-08 NOTE — ED Notes (Signed)
Pt reports he has a sinus infection . Pt has taken OTC meds without  Relief. Pain located behind Lt eye and left ear . Pain radiates into neck.

## 2020-01-16 ENCOUNTER — Emergency Department (HOSPITAL_COMMUNITY)
Admission: EM | Admit: 2020-01-16 | Discharge: 2020-01-16 | Disposition: A | Discharge status: Home / Self Care | Payer: Self-pay | Attending: Emergency Medicine | Admitting: Emergency Medicine

## 2020-01-16 ENCOUNTER — Other Ambulatory Visit: Payer: Self-pay

## 2020-01-16 ENCOUNTER — Encounter (HOSPITAL_COMMUNITY): Payer: Self-pay

## 2020-01-16 DIAGNOSIS — Z79899 Other long term (current) drug therapy: Secondary | ICD-10-CM | POA: Insufficient documentation

## 2020-01-16 DIAGNOSIS — F17210 Nicotine dependence, cigarettes, uncomplicated: Secondary | ICD-10-CM | POA: Insufficient documentation

## 2020-01-16 DIAGNOSIS — R519 Headache, unspecified: Secondary | ICD-10-CM | POA: Insufficient documentation

## 2020-01-16 DIAGNOSIS — I10 Essential (primary) hypertension: Secondary | ICD-10-CM | POA: Insufficient documentation

## 2020-01-16 DIAGNOSIS — M546 Pain in thoracic spine: Secondary | ICD-10-CM | POA: Insufficient documentation

## 2020-01-16 DIAGNOSIS — M542 Cervicalgia: Secondary | ICD-10-CM | POA: Insufficient documentation

## 2020-01-16 MED ORDER — DEXAMETHASONE SODIUM PHOSPHATE 4 MG/ML IJ SOLN
4.0000 mg | Freq: Once | INTRAMUSCULAR | Status: AC
  Administered 2020-01-16: 4 mg via INTRAMUSCULAR
  Filled 2020-01-16: qty 1

## 2020-01-16 MED ORDER — DIPHENHYDRAMINE HCL 25 MG PO CAPS
25.0000 mg | ORAL_CAPSULE | Freq: Once | ORAL | Status: AC
  Administered 2020-01-16: 25 mg via ORAL
  Filled 2020-01-16: qty 1

## 2020-01-16 MED ORDER — LIDOCAINE 5 % EX PTCH
1.0000 | MEDICATED_PATCH | Freq: Once | CUTANEOUS | Status: DC
  Administered 2020-01-16: 1 via TRANSDERMAL
  Filled 2020-01-16: qty 1

## 2020-01-16 MED ORDER — ACETAMINOPHEN 500 MG PO TABS
1000.0000 mg | ORAL_TABLET | Freq: Once | ORAL | Status: AC
  Administered 2020-01-16: 1000 mg via ORAL
  Filled 2020-01-16: qty 2

## 2020-01-16 MED ORDER — CYCLOBENZAPRINE HCL 10 MG PO TABS
10.0000 mg | ORAL_TABLET | Freq: Two times a day (BID) | ORAL | 0 refills | Status: DC | PRN
Start: 2020-01-16 — End: 2020-02-12

## 2020-01-16 NOTE — Discharge Instructions (Addendum)
You have been seen today for neck pain. Please read and follow all provided instructions. Return to the emergency room for worsening condition or new concerning symptoms.    1. Medications:  Prescription sent to your pharmacy for Flexeril.  This is a muscle relaxer.  This should help your neck and back pain.  This medicine can make you drowsy so do not drive or work when taking.  Most you will take this at bedtime.  Continue usual home medications Take medications as prescribed. Please review all of the medicines and only take them if you do not have an allergy to them.   2. Treatment: rest, drink plenty of fluids  3. Follow Up:   -At one of your recent emergency room visits your creatinine level which is your kidney function was slightly elevated.  I recommend you have this rechecked by your primary care doctor within 1 week.  This can be done by blood test. Please follow up with primary care provider by scheduling an appointment as soon as possible for a visit  If you do not have a primary care physician, contact [facility] at [phone] for referral   It is also a possibility that you have an allergic reaction to any of the medicines that you have been prescribed - Everybody reacts differently to medications and while MOST people have no trouble with most medicines, you may have a reaction such as nausea, vomiting, rash, swelling, shortness of breath. If this is the case, please stop taking the medicine immediately and contact your physician.  ?

## 2020-01-16 NOTE — ED Triage Notes (Signed)
Patient complains of occipital headache with neck and back pain. Pain worse with any ROM-denies injury. Patient seen earlier in month for same. No other associated symptoms

## 2020-01-16 NOTE — ED Notes (Signed)
Discharge instructions reviewed with pt. Pt verbalized understanding.   

## 2020-01-16 NOTE — ED Provider Notes (Signed)
[facility] EMERGENCY DEPARTMENT Provider Note   CSN: [id number] Arrival date & time: 01/16/20  1137     History Chief Complaint  Patient presents with  . Headache  . Neck Pain  . Back Pain    Shawn [name] is a 34 y.o. male past medical history significant for hypertension, dilated cardiomyopathy presents to emergency department today with chief complaint of headache, neck pain, back pain x3 weeks.  Patient states the symptoms are intermittent.  He states he has been seen in the emergency department for headache twice this month.  Today he is reporting a dull aching headache located on the back of his head that radiates to his temples.  He states the headache has progressively worsened since onset.  It feels similar to headaches he has had in the past.  He rates the pain 8 of 10 in severity.  He has not taken any medications for symptoms prior to arrival.  He is also endorsing left-sided neck pain and pain in his left upper back.  He states his pain has been intermittent.  He rates that pain 6 out of 10 in severity.  He did state that he got a new mattress and pillow lately and is unsure if that is the cause of his pain.  Patient does state that he was able to see his eye doctor recently and ordered a new pair of glasses however they have not yet been delivered. Denies fever, syncope, head trauma, photophobia, phonophobia, UL throbbing, N/V, visual changes, stiff neck, chest pain, shortness of breath, rash, or "thunderclap" onset.     Past Medical History:  Diagnosis Date  . Abscess, perirectal, x2  05/18/2013  . Dilated cardiomyopathy ([place]) 05/18/2013   By catheterization 2014   . Hypertension     Patient Active Problem List   Diagnosis Date Noted  . Alcohol abuse 11/06/2019  . Tobacco abuse 05/18/2013  . Dilated cardiomyopathy ([place]) 05/18/2013  . Abscess, perirectal, x2  05/18/2013  . Chest pain at rest 11/20/2012  . Hypertension 11/20/2012    Past Surgical  History:  Procedure Laterality Date  . LEFT AND RIGHT HEART CATHETERIZATION WITH CORONARY ANGIOGRAM N/A 11/21/2012   Procedure: LEFT AND RIGHT HEART CATHETERIZATION WITH CORONARY ANGIOGRAM;  Surgeon: Birdie Riddle, MD;  Location: [facility]ATH [facility];  Service: Cardiovascular;  Laterality: N/A;  . MANDIBLE FRACTURE SURGERY  2006       Family History  Problem Relation Age of Onset  . Diabetes Mother   . Hypertension Other     Social History   Tobacco Use  . Smoking status: Current Every Day Smoker    Packs/day: 1.00    Types: Cigarettes  . Smokeless tobacco: Never Used  Substance Use Topics  . Alcohol use: Yes    Comment: occ  . Drug use: No    Home Medications Prior to Admission medications   Medication Sig Start Date End Date Taking? Authorizing Provider  cyclobenzaprine (FLEXERIL) 10 MG tablet Take 1 tablet (10 mg total) by mouth 2 (two) times daily as needed for muscle spasms. 01/16/20   Amando Chaput E, PA-C  ibuprofen (ADVIL,MOTRIN) 400 MG tablet Take 1 tablet (400 mg total) by mouth every 8 (eight) hours as needed for moderate pain. 10/07/18   Ripley Fraise, MD  lisinopril (ZESTRIL) 10 MG tablet Take 1 tablet (10 mg total) by mouth daily. 10/25/19 11/24/19  Couture, Cortni S, PA-C  metoprolol tartrate (LOPRESSOR) 50 MG tablet Take 0.5 tablets (25 mg total) by  mouth 2 (two) times daily. Patient not taking: Reported on 04/16/2018 11/23/17   Okey Regal, PA-C    Allergies    Shrimp [shellfish allergy]  Review of Systems   Review of Systems All other systems are reviewed and are negative for acute change except as noted in the HPI.  Physical Exam Updated Vital Signs BP (!) 146/100   Pulse 90   Temp 98.7 F (37.1 C) (Oral)   Resp 16   SpO2 100%   Physical Exam Vitals and nursing note reviewed.  Constitutional:      General: He is not in acute distress.    Appearance: He is not ill-appearing.  HENT:     Head: Normocephalic and atraumatic.     Comments: No sinus or  temporal tenderness.    Right Ear: Tympanic membrane and external ear normal.     Left Ear: Tympanic membrane and external ear normal.     Nose: Nose normal.     Mouth/Throat:     Mouth: Mucous membranes are moist.     Pharynx: Oropharynx is clear.  Eyes:     General: No scleral icterus.       Right eye: No discharge.        Left eye: No discharge.     Extraocular Movements: Extraocular movements intact.     Conjunctiva/sclera: Conjunctivae normal.     Pupils: Pupils are equal, round, and reactive to light.  Neck:     Vascular: No JVD.     Comments: Full ROM intact without spinous process TTP. No bony stepoffs or deformities, left paraspinous muscle TTP, no muscle spasms. No rigidity or meningeal signs. No bruising, erythema, or swelling.  Cardiovascular:     Rate and Rhythm: Normal rate and regular rhythm.     Pulses: Normal pulses.          Radial pulses are 2+ on the right side and 2+ on the left side.     Heart sounds: Normal heart sounds.  Pulmonary:     Comments: Lungs clear to auscultation in all fields. Symmetric chest rise. No wheezing, rales, or rhonchi. Abdominal:     Comments: Abdomen is soft, non-distended, and non-tender in all quadrants. No rigidity, no guarding. No peritoneal signs.  Musculoskeletal:        General: Normal range of motion.     Cervical back: Normal range of motion.     Comments: Tenderness to palpation of left scapula.  No overlying skin changes.  Full range of motion of bilateral shoulders elbows.  No thoracic, or lumbar spinal tenderness to palpation. No paraspinal tenderness. No step offs, crepitus or deformity palpated.   Skin:    General: Skin is warm and dry.     Capillary Refill: Capillary refill takes less than 2 seconds.  Neurological:     Mental Status: He is oriented to person, place, and time.     GCS: GCS eye subscore is 4. GCS verbal subscore is 5. GCS motor subscore is 6.     Comments: Fluent speech, no facial droop.  Speech  is clear and goal oriented, follows commands CN III-XII intact, no facial droop Normal strength in upper and lower extremities bilaterally including dorsiflexion and plantar flexion, strong and equal grip strength Sensation normal to light and sharp touch Moves extremities without ataxia, coordination intact Normal finger to nose and rapid alternating movements Normal gait and balance  Psychiatric:        Behavior: Behavior normal.  ED Results / Procedures / Treatments   Labs (all labs ordered are listed, but only abnormal results are displayed) Labs Reviewed - No data to display  EKG None  Radiology No results found.  Procedures Procedures (including critical care time)  Medications Ordered in ED Medications  lidocaine (LIDODERM) 5 % 1 patch (1 patch Transdermal Patch Applied 01/16/20 1834)  diphenhydrAMINE (BENADRYL) capsule 25 mg (25 mg Oral Given 01/16/20 1834)  dexamethasone (DECADRON) injection 4 mg (4 mg Intramuscular Given 01/16/20 1837)  acetaminophen (TYLENOL) tablet 1,000 mg (1,000 mg Oral Given 01/16/20 1834)    ED Course  I have reviewed the triage vital signs and the nursing notes.  Pertinent labs & imaging results that were available during my care of the patient were reviewed by me and considered in my medical decision making (see chart for details).    MDM Rules/Calculators/A&P                      History provided by patient with additional history obtained from chart review.    I viewed his recent ED visits for headaches.  He has had a CT that was negative and basic labs that showed mild elevation in creatinine at 1.27.  Pt HA treated and improved while in ED.  Presentation is like pts typical HA and non concerning for [facility], ICH, Meningitis, or temporal arteritis. Pt is afebrile with no focal neuro deficits, nuchal rigidity, or change in vision.  No chest pain and back pain does not suggest dissection as cause of his left sided back pain.  Patient's blood  pressure here is stable.  Pt is to follow up with PCP to discuss prophylactic medication as well as how to better control his blood pressure..  I suspect patient's headaches are also related to his needing glasses.  Thankfully they are scheduled to be delivered next week. Pt verbalizes understanding and is agreeable with plan to dc.  Advised patient is to have creatinine rechecked as well by PCP within 1 week.  Portions of this note were generated with [employer]. Dictation errors may occur despite best attempts at proofreading.   Final Clinical Impression(s) / ED Diagnoses Final diagnoses:  Acute nonintractable headache, unspecified headache type    Rx / DC Orders ED Discharge Orders         Ordered    cyclobenzaprine (FLEXERIL) 10 MG tablet  2 times daily PRN     01/16/20 1914           Flint Melter 01/16/20 1915    Fredia Sorrow, MD 01/17/20 1537

## 2020-02-11 ENCOUNTER — Ambulatory Visit: Payer: Self-pay | Admitting: Family Medicine

## 2020-02-12 ENCOUNTER — Other Ambulatory Visit: Payer: Self-pay

## 2020-02-12 ENCOUNTER — Encounter: Payer: Self-pay | Admitting: Family Medicine

## 2020-02-12 ENCOUNTER — Ambulatory Visit: Payer: Self-pay | Attending: Family Medicine | Admitting: Family Medicine

## 2020-02-12 DIAGNOSIS — N390 Urinary tract infection, site not specified: Secondary | ICD-10-CM

## 2020-02-12 DIAGNOSIS — G8929 Other chronic pain: Secondary | ICD-10-CM

## 2020-02-12 DIAGNOSIS — I420 Dilated cardiomyopathy: Secondary | ICD-10-CM

## 2020-02-12 DIAGNOSIS — M545 Low back pain, unspecified: Secondary | ICD-10-CM

## 2020-02-12 MED ORDER — CEPHALEXIN 500 MG PO CAPS: 500.0000 mg | ORAL_CAPSULE | Freq: Two times a day (BID) | ORAL | 0 refills | Status: DC

## 2020-02-12 MED ORDER — CYCLOBENZAPRINE HCL 10 MG PO TABS: 10.0000 mg | ORAL_TABLET | Freq: Two times a day (BID) | ORAL | 0 refills | Status: DC | PRN

## 2020-02-12 MED ORDER — LISINOPRIL 10 MG PO TABS: 10.0000 mg | ORAL_TABLET | Freq: Every day | ORAL | 6 refills | Status: DC

## 2020-02-12 NOTE — Progress Notes (Signed)
Patient states that he had a uti and is having pain in back and side.  Sates that he feel nausea and tired.  Need refills on medications.

## 2020-02-12 NOTE — Progress Notes (Signed)
Virtual Visit via Telephone Note  I connected with Shawn [name], on 02/12/2020 at 10:36 AM by telephone due to the COVID-19 pandemic and verified that I am speaking with the correct person using two identifiers.   Consent: I discussed the limitations, risks, security and privacy concerns of performing an evaluation and management service by telephone and the availability of in person appointments. I also discussed with the patient that there may be a patient responsible charge related to this service. The patient expressed understanding and agreed to proceed.   Location of Patient: Home  Location of Provider: Clinic   Persons participating in Telemedicine visit: Lincon Sahlin Farrington-CMA Dr. Margarita Rana     History of Present Illness: Shawn [name] is a 34 year old male with a history of hypertension, dilated cardiomyopathy (diagnosed in 2014 per epic notes -nuclear stress test with dilated LV, moderate LV systolic dysfunction,  cardiac cath revealed hypertensive heart disease with mild LV systolic dysfunction and normal coronaries), alcohol abuse who presents today for follow-up visit. He had a UTI in 10/2019 but states he now has dsyuria, abdominal pain, fatigue, low back pain, no hematuria or fever. Last week he started to experience symptoms. No history of kidney stones. At his ED visit in 10/2019 he was treated with Keflex and recommendation was to see a urologist which he never did.  He has boils on the inner aspects of his thigh which he has had in the past.  Denies presence of boils in underarm.  With regards to his cardiomyopathy he denies dyspnea, pedal edema, chest pain. Does not check his weight regularly. States he has cut back on alcohol intake and drinks a cup every couple of days. Plan was to refer him for an echocardiogram and also to see cardiology to evaluate cardiac function however he is yet to apply for the [facility] health financial discount and has no  medical coverage.  Seen by Dr. Doylene Canard in 2014.   He requests refill of Flexeril for his chronic low back pain. Past Medical History:  Diagnosis Date  . Abscess, perirectal, x2  05/18/2013  . Dilated cardiomyopathy ([place]) 05/18/2013   By catheterization 2014   . Hypertension    Allergies  Allergen Reactions  . Shrimp [Shellfish Allergy] Shortness Of Breath    Current Outpatient Medications on File Prior to Visit  Medication Sig Dispense Refill  . cyclobenzaprine (FLEXERIL) 10 MG tablet Take 1 tablet (10 mg total) by mouth 2 (two) times daily as needed for muscle spasms. (Patient not taking: Reported on 02/12/2020) 10 tablet 0  . ibuprofen (ADVIL,MOTRIN) 400 MG tablet Take 1 tablet (400 mg total) by mouth every 8 (eight) hours as needed for moderate pain. (Patient not taking: Reported on 02/12/2020) 21 tablet 0  . lisinopril (ZESTRIL) 10 MG tablet Take 1 tablet (10 mg total) by mouth daily. 30 tablet 0  . metoprolol tartrate (LOPRESSOR) 50 MG tablet Take 0.5 tablets (25 mg total) by mouth 2 (two) times daily. (Patient not taking: Reported on 04/16/2018) 60 tablet 0   No current facility-administered medications on file prior to visit.    Observations/Objective: Awake, alert, oriented x3 Not in acute distress  Assessment and Plan: 1. Recurrent UTI No known underlying risk factors for recurrent UTI This is concerning given he is a male patient Advised to apply for the [facility] financial discount and orange card to facilitate urology referral - cephALEXin (KEFLEX) 500 MG capsule; Take 1 capsule (500 mg total) by mouth 2 (two) times daily.  Dispense: 20 capsule; Refill: 0 - Ambulatory referral to Urology  2. Dilated cardiomyopathy ([place]) This was noted in ED notes but he has no formal echocardiogram on file with this diagnosis - lisinopril (ZESTRIL) 10 MG tablet; Take 1 tablet (10 mg total) by mouth daily.  Dispense: 30 tablet; Refill: 6  3. Chronic midline low back pain without  sciatica Stable - cyclobenzaprine (FLEXERIL) 10 MG tablet; Take 1 tablet (10 mg total) by mouth 2 (two) times daily as needed for muscle spasms.  Dispense: 30 tablet; Refill: 0   Follow Up Instructions: 3 months for chronic disease management   I discussed the assessment and treatment plan with the patient. The patient was provided an opportunity to ask questions and all were answered. The patient agreed with the plan and demonstrated an understanding of the instructions.   The patient was advised to call back or seek an in-person evaluation if the symptoms worsen or if the condition fails to improve as anticipated.     I provided 16 minutes total of non-face-to-face time during this encounter including median intraservice time, reviewing previous notes, investigations, ordering medications, medical decision making, coordinating care and patient verbalized understanding at the end of the visit.     Charlott Rakes, MD, FAAFP. [facility] and [facility] [place], [place]   02/12/2020, 10:36 AM

## 2020-06-04 ENCOUNTER — Encounter (HOSPITAL_COMMUNITY): Payer: Self-pay | Admitting: Emergency Medicine

## 2020-06-04 ENCOUNTER — Emergency Department (HOSPITAL_COMMUNITY): Payer: Self-pay

## 2020-06-04 ENCOUNTER — Emergency Department (HOSPITAL_COMMUNITY)
Admission: EM | Admit: 2020-06-04 | Discharge: 2020-06-04 | Disposition: A | Discharge status: Home / Self Care | Payer: Self-pay | Attending: Emergency Medicine | Admitting: Emergency Medicine

## 2020-06-04 DIAGNOSIS — F17210 Nicotine dependence, cigarettes, uncomplicated: Secondary | ICD-10-CM | POA: Insufficient documentation

## 2020-06-04 DIAGNOSIS — K6289 Other specified diseases of anus and rectum: Secondary | ICD-10-CM | POA: Insufficient documentation

## 2020-06-04 DIAGNOSIS — I10 Essential (primary) hypertension: Secondary | ICD-10-CM | POA: Insufficient documentation

## 2020-06-04 DIAGNOSIS — K604 Rectal fistula, unspecified: Secondary | ICD-10-CM | POA: Diagnosis present

## 2020-06-04 DIAGNOSIS — N390 Urinary tract infection, site not specified: Secondary | ICD-10-CM | POA: Insufficient documentation

## 2020-06-04 DIAGNOSIS — K50113 Crohn's disease of large intestine with fistula: Secondary | ICD-10-CM | POA: Diagnosis present

## 2020-06-04 DIAGNOSIS — N289 Disorder of kidney and ureter, unspecified: Secondary | ICD-10-CM | POA: Insufficient documentation

## 2020-06-04 LAB — URINALYSIS, ROUTINE W REFLEX MICROSCOPIC
Bilirubin Urine: NEGATIVE
Glucose, UA: NEGATIVE mg/dL
Ketones, ur: NEGATIVE mg/dL
Nitrite: POSITIVE — AB
Protein, ur: 100 mg/dL — AB
RBC / HPF: >50 RBC/hpf — ABNORMAL HIGH (ref 0–5)
Specific Gravity, Urine: 1.012 (ref 1.005–1.030)
WBC, UA: >50 WBC/hpf — ABNORMAL HIGH (ref 0–5)
pH: 5 (ref 5.0–8.0)

## 2020-06-04 LAB — CBC WITH DIFFERENTIAL/PLATELET
Abs Immature Granulocytes: 0.05 10*3/uL (ref 0.00–0.07)
Basophils Absolute: 0.1 10*3/uL (ref 0.0–0.1)
Basophils Relative: 1 %
Eosinophils Absolute: 0.7 10*3/uL — ABNORMAL HIGH (ref 0.0–0.5)
Eosinophils Relative: 6 %
HCT: 36.6 % — ABNORMAL LOW (ref 39.0–52.0)
Hemoglobin: 11.9 g/dL — ABNORMAL LOW (ref 13.0–17.0)
Immature Granulocytes: 1 %
Lymphocytes Relative: 21 %
Lymphs Abs: 2.2 10*3/uL (ref 0.7–4.0)
MCH: 31.3 pg (ref 26.0–34.0)
MCHC: 32.5 g/dL (ref 30.0–36.0)
MCV: 96.3 fL (ref 80.0–100.0)
Monocytes Absolute: 0.6 10*3/uL (ref 0.1–1.0)
Monocytes Relative: 6 %
Neutro Abs: 7.2 10*3/uL (ref 1.7–7.7)
Neutrophils Relative %: 65 %
Platelets: 305 10*3/uL (ref 150–400)
RBC: 3.8 MIL/uL — ABNORMAL LOW (ref 4.22–5.81)
RDW: 16.4 % — ABNORMAL HIGH (ref 11.5–15.5)
WBC: 10.8 10*3/uL — ABNORMAL HIGH (ref 4.0–10.5)
nRBC: 0 % (ref 0.0–0.2)

## 2020-06-04 LAB — COMPREHENSIVE METABOLIC PANEL
ALT: 27 U/L (ref 0–44)
AST: 18 U/L (ref 15–41)
Albumin: 3.2 g/dL — ABNORMAL LOW (ref 3.5–5.0)
Alkaline Phosphatase: 110 U/L (ref 38–126)
Anion gap: 7 (ref 5–15)
BUN: 10 mg/dL (ref 6–20)
CO2: 23 mmol/L (ref 22–32)
Calcium: 8.6 mg/dL — ABNORMAL LOW (ref 8.9–10.3)
Chloride: 110 mmol/L (ref 98–111)
Creatinine, Ser: 1.23 mg/dL (ref 0.61–1.24)
GFR, Estimated: >60 mL/min (ref >60)
Glucose, Bld: 106 mg/dL — ABNORMAL HIGH (ref 70–99)
Potassium: 4 mmol/L (ref 3.5–5.1)
Sodium: 140 mmol/L (ref 135–145)
Total Bilirubin: 0.3 mg/dL (ref 0.3–1.2)
Total Protein: 7.4 g/dL (ref 6.5–8.1)

## 2020-06-04 LAB — LIPASE, BLOOD: Lipase: 35 U/L (ref 11–51)

## 2020-06-04 IMAGING — CT CT RENAL STONE PROTOCOL
2 of 3 series · 16 of 46 positions shown, 18 images · non-contrast
Comparison: None.

CLINICAL DATA: Flank and lower abdominal pain.  Dysuria.

EXAM:
CT ABDOMEN AND PELVIS WITHOUT CONTRAST
TECHNIQUE: Multidetector CT imaging of the abdomen and pelvis was performed
following the standard protocol without IV contrast.

[Series 3: renal stone 5.0 · axial · 0.81mm/px · z∈[+909,+1344]mm · 13 of 101 slices shown, 15 images]
[im 7/101  soft-tissue]
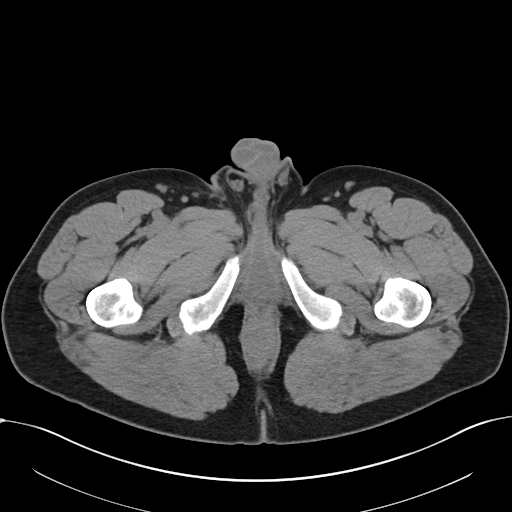
[im 7/101  bone]
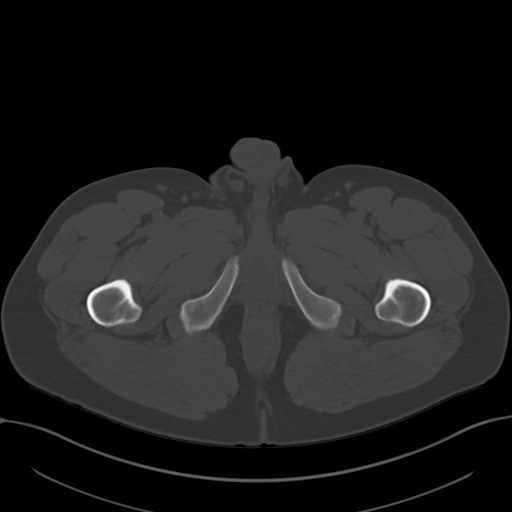
[im 13/101  soft-tissue]
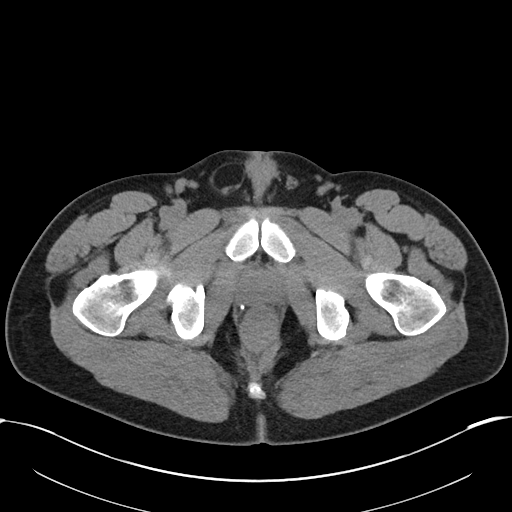
[im 20/101  soft-tissue]
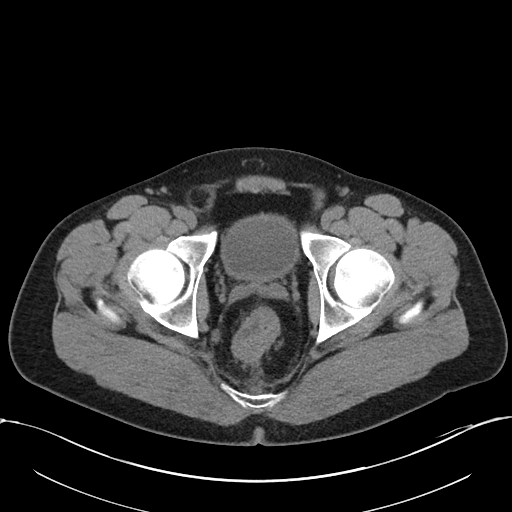
[im 30/101  soft-tissue]
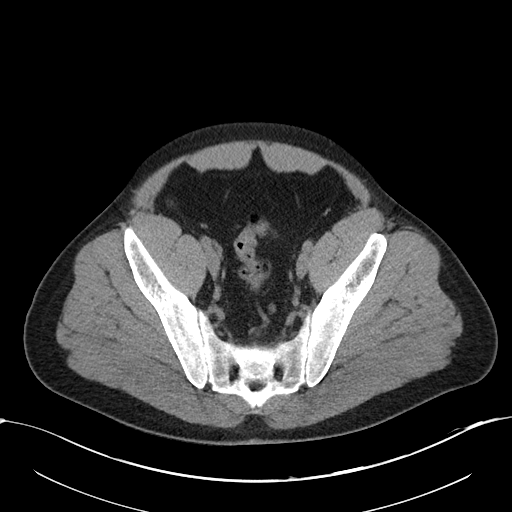
[im 36/101  soft-tissue]
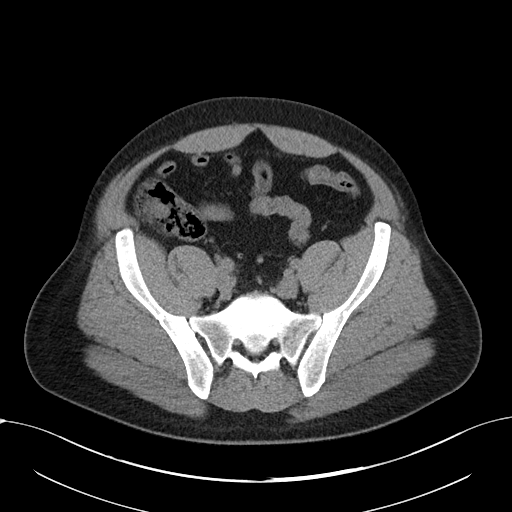
[im 42/101  soft-tissue]
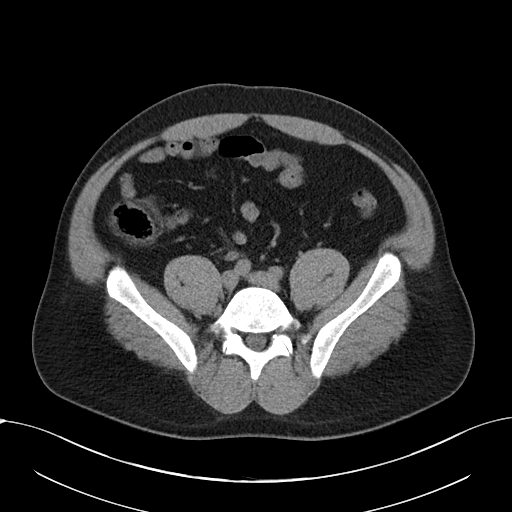
[im 52/101  soft-tissue]
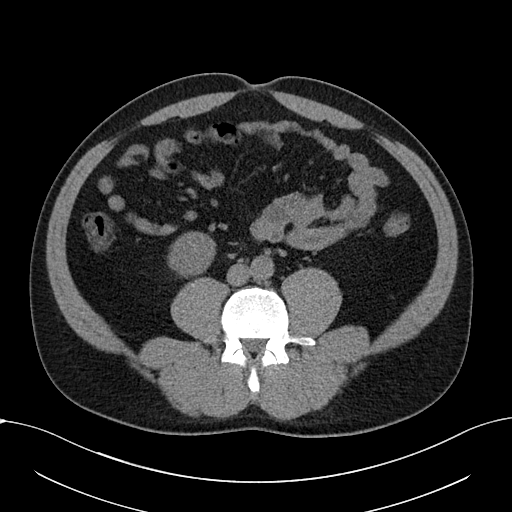
[im 59/101  soft-tissue]
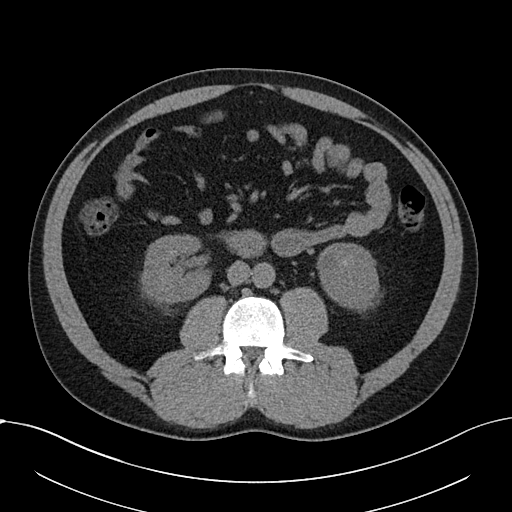
[im 65/101  soft-tissue]
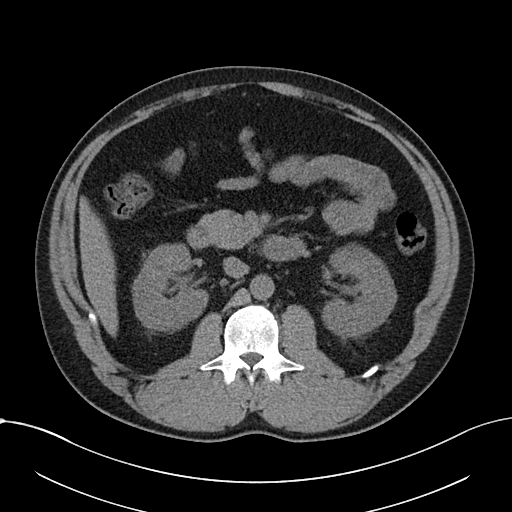
[im 65/101  bone]
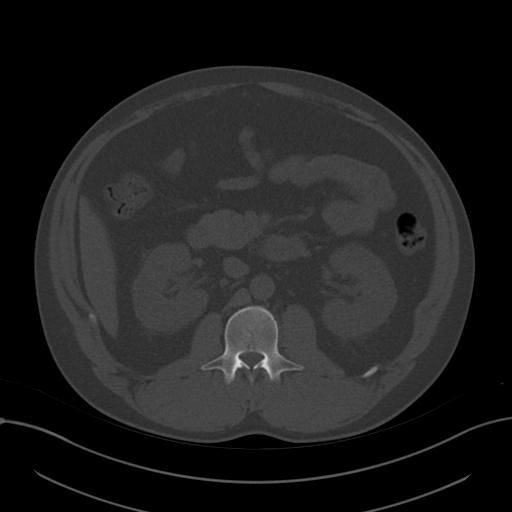
[im 71/101  soft-tissue]
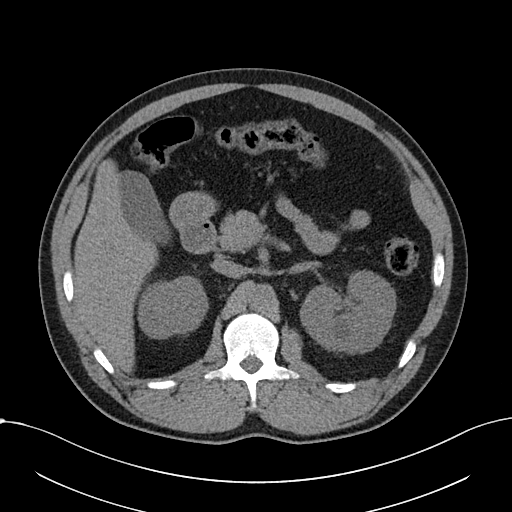
[im 81/101  soft-tissue]
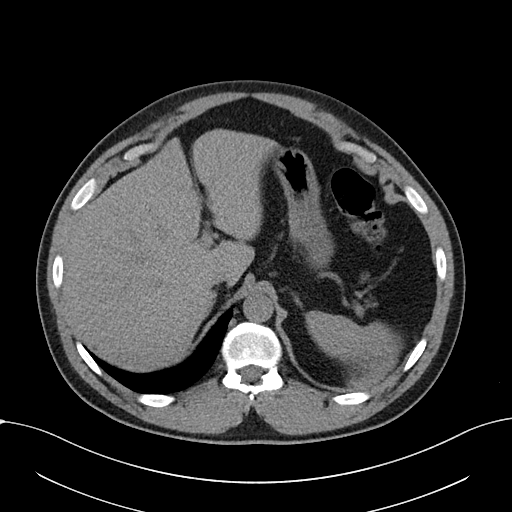
[im 88/101  soft-tissue]
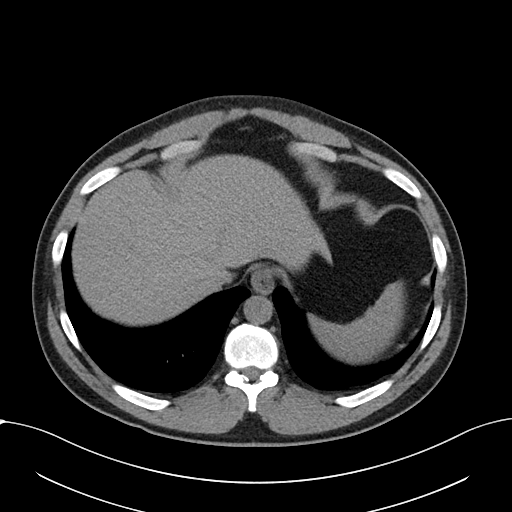
[im 94/101  soft-tissue]
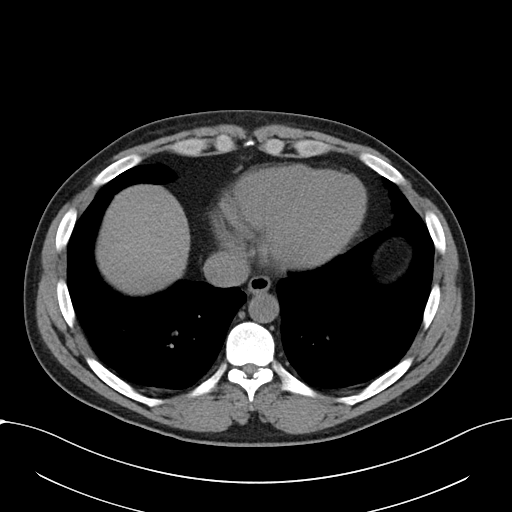

[Series 6: coronal · coronal · 0.90mm/px · 3 of 103 slices shown]
[im 35/103  soft-tissue]
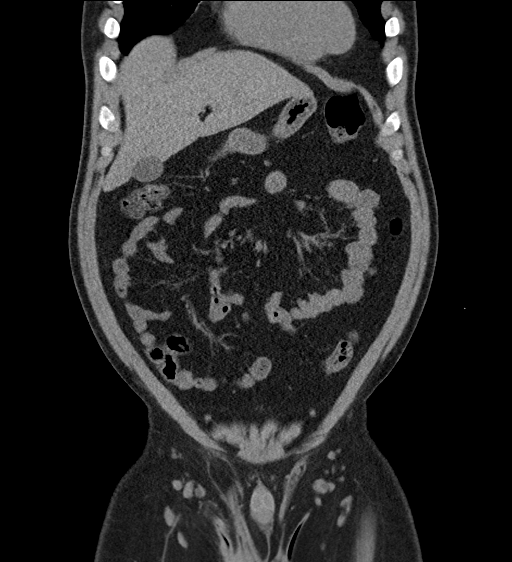
[im 46/103  soft-tissue]
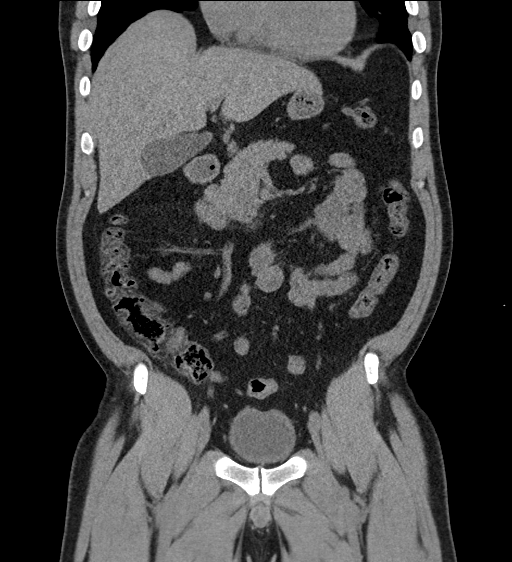
[im 57/103  soft-tissue]
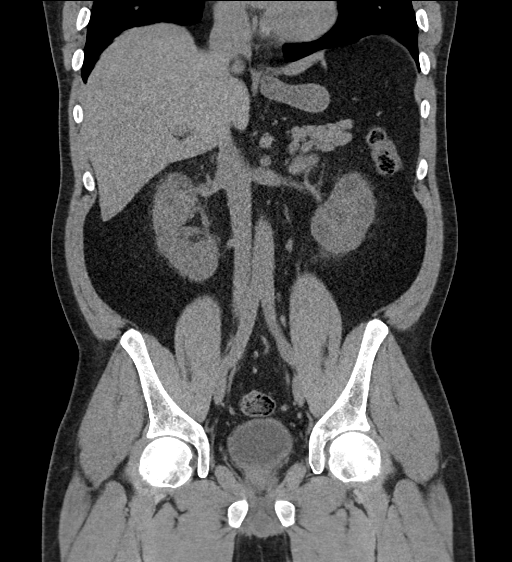

[16 of 46 positions shown; findings below may reference images not displayed]

FINDINGS: Lower chest: No acute findings.

Hepatobiliary: No mass visualized on this unenhanced exam.
Gallbladder is unremarkable. No evidence of biliary ductal
dilatation.

Pancreas: No mass or inflammatory process visualized on this
unenhanced exam.

Spleen:  Within normal limits in size.

Adrenals/Urinary tract: No evidence of urolithiasis or
hydronephrosis. Several low-attenuation lesions are seen in both
kidneys which cannot be characterized on this unenhanced exam.
Unremarkable unopacified urinary bladder.

Stomach/Bowel: No evidence of dilated bowel loops. Normal appearance
of appendix. Soft tissue prominence is seen in the lower rectum with
adjacent perirectal soft tissue stranding. Several mildly enlarged
lymph nodes are seen in the posterior and lateral perirectal space,
largest measuring 8 mm on image 83/3. Differential diagnosis
includes anorectal carcinoma and proctitis.

Vascular/Lymphatic: Multiple perirectal lymph nodes measuring up to
8 mm. No other sites of lymphadenopathy identified. No evidence of
abdominal aortic aneurysm.

Reproductive:  No mass or other significant abnormality.

Other:  Small right inguinal hernia which contains only fat.

Musculoskeletal:  No suspicious bone lesions identified.
IMPRESSION: No evidence of urolithiasis or hydronephrosis.

Several low-attenuation lesions in both kidneys cannot be
characterized on this unenhanced exam. Recommend abdomen MRI without
and with contrast for further characterization.

Soft tissue prominence in lower rectum with adjacent perirectal soft
tissue stranding and several mildly enlarged perirectal lymph nodes.
Differential diagnosis includes anorectal carcinoma and proctitis.
Recommend correlation with rectal exam and/or proctoscopy.

## 2020-06-04 MED ORDER — SODIUM CHLORIDE 0.9 % IV SOLN
1.0000 g | Freq: Once | INTRAVENOUS | Status: AC
  Administered 2020-06-04: 1 g via INTRAVENOUS
  Filled 2020-06-04: qty 10

## 2020-06-04 MED ORDER — SULFAMETHOXAZOLE-TRIMETHOPRIM 800-160 MG PO TABS: 1.0000 | ORAL_TABLET | Freq: Two times a day (BID) | ORAL | 0 refills | Status: AC

## 2020-06-04 NOTE — ED Triage Notes (Signed)
Pt states that over the weekend he began having lower abd pain with painful urination, began having lower back pain last night. Endorses feeling like he had a fever at home a few days ago.

## 2020-06-04 NOTE — Discharge Instructions (Signed)
Shawn [name], it is a pleasure getting to know you today.  You came in here for a UTI.  CT abdomen pelvis did not show any kidney stones or obstruction.  I will prescribe 10 days of Bactrim, please take 1 pill twice a day.  Please follow-up with your primary care physician at [facility].  Please ask him about any assistance programs or orange card.  Your CT scan shows several lesion in both kidneys which is difficult to be visualized on a noncontrast CT.  I will send out a referral to urology for further management.  The CT scan also shows soft tissue prominence in the lower rectum which can be inflammation or cancer of the rectum.  I also send out referral to gastroenterology for more checkup.  Please come back to the ED for worsening symptoms.  Take care

## 2020-06-04 NOTE — ED Provider Notes (Signed)
[facility] EMERGENCY DEPARTMENT Provider Note   CSN: [id number] Arrival date & time: 06/04/20  6629     History Chief Complaint  Patient presents with  . Recurrent UTI    Shawn [name] is a 34 y.o. male with past medical history of hypertension who presented to the ED for a UTI symptoms.  Patient states that he had abdominal pain and fever 1 week prior and started to develop dysuria 3 days ago.  He denies blood in his urine.  Patient also complains of lower back pain.  He is sexually active and in a stable relationship with his male partner.  Last intercourse was last week, no protection, denies penile discharge or sores.  Patient states that he has been having recurrent UTI in March and in June.  He was given Keflex by his primary care provider at [facility] and urology referral.  Patient states that he did not follow up with a urologist due to financial issue.  HPI     Past Medical History:  Diagnosis Date  . Abscess, perirectal, x2  05/18/2013  . Dilated cardiomyopathy ([place]) 05/18/2013   By catheterization 2014   . Hypertension     Patient Active Problem List   Diagnosis Date Noted  . Lower urinary tract infectious disease 06/04/2020  . Renal lesion 06/04/2020  . 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 Alcohol abuse 11/06/2019  . Tobacco abuse 05/18/2013  . Dilated cardiomyopathy ([place]) 05/18/2013  . Abscess, perirectal, x2  05/18/2013  . Chest pain at rest 11/20/2012  . Hypertension 11/20/2012    Past Surgical History:  Procedure Laterality Date  . LEFT AND RIGHT HEART CATHETERIZATION WITH CORONARY ANGIOGRAM N/A 11/21/2012   Procedure: LEFT AND RIGHT HEART CATHETERIZATION WITH CORONARY ANGIOGRAM;  Surgeon: Birdie Riddle, MD;  Location: [place] CATH LAB;  Service: Cardiovascular;  Laterality: N/A;  . MANDIBLE FRACTURE SURGERY  2006       Family History  Problem Relation Age of Onset  . Diabetes Mother   . Hypertension Other      Social History   Tobacco Use  . Smoking status: Current Every Day Smoker    Packs/day: 1.00    Types: Cigarettes  . Smokeless tobacco: Never Used  Substance Use Topics  . Alcohol use: Yes    Comment: occ  . Drug use: No    Home Medications Prior to Admission medications   Medication Sig Start Date End Date Taking? Authorizing Provider  cephALEXin (KEFLEX) 500 MG capsule Take 1 capsule (500 mg total) by mouth 2 (two) times daily. 02/12/20   Charlott Rakes, MD  cyclobenzaprine (FLEXERIL) 10 MG tablet Take 1 tablet (10 mg total) by mouth 2 (two) times daily as needed for muscle spasms. 02/12/20   Charlott Rakes, MD  ibuprofen (ADVIL,MOTRIN) 400 MG tablet Take 1 tablet (400 mg total) by mouth every 8 (eight) hours as needed for moderate pain. Patient not taking: Reported on 02/12/2020 10/07/18   Ripley Fraise, MD  lisinopril (ZESTRIL) 10 MG tablet Take 1 tablet (10 mg total) by mouth daily. 02/12/20 03/13/20  Charlott Rakes, MD  sulfamethoxazole-trimethoprim (BACTRIM DS) 800-160 MG tablet Take 1 tablet by mouth 2 (two) times daily for 10 days. 06/04/20 06/14/20  Gaylan Gerold, DO    Allergies    Shrimp [shellfish allergy]  Review of Systems   Review of Systems  Constitutional: Positive for fever.  Gastrointestinal: Positive for abdominal pain and nausea. Negative for diarrhea and vomiting.  Genitourinary: Positive for  dysuria. Negative for discharge, flank pain, genital sores and hematuria.    Physical Exam Updated Vital Signs BP 132/83   Pulse 64   Temp 98.4 F (36.9 C) (Oral)   Resp 18   Ht 6' 1"  (1.854 m)   Wt 95.3 kg   SpO2 100%   BMI 27.71 kg/m   Physical Exam Constitutional:      General: He is not in acute distress. HENT:     Head: Normocephalic.  Eyes:     General:        Right eye: No discharge.        Left eye: No discharge.  Cardiovascular:     Rate and Rhythm: Normal rate and regular rhythm.  Pulmonary:     Effort: No respiratory distress.      Breath sounds: Normal breath sounds. No wheezing.  Abdominal:     General: Bowel sounds are normal. There is distension (Mildly distended).     Tenderness: There is no abdominal tenderness. There is right CVA tenderness. There is no left CVA tenderness or guarding.  Genitourinary:    Comments: Normal appearance penis.  No penile discharge. Draining sores noted in the right groin, appears like HS No significant tenderness to prostate palpation Musculoskeletal:        General: Normal range of motion.  Skin:    General: Skin is warm.  Neurological:     Mental Status: He is alert.  Psychiatric:        Mood and Affect: Mood normal.     ED Results / Procedures / Treatments   Labs (all labs ordered are listed, but only abnormal results are displayed) Labs Reviewed  URINALYSIS, ROUTINE W REFLEX MICROSCOPIC - Abnormal; Notable for the following components:      Result Value   APPearance CLOUDY (*)    Hgb urine dipstick MODERATE (*)    Protein, ur 100 (*)    Nitrite POSITIVE (*)    Leukocytes,Ua LARGE (*)    RBC / HPF >50 (*)    WBC, UA >50 (*)    Bacteria, UA FEW (*)    Non Squamous Epithelial 0-5 (*)    All other components within normal limits  COMPREHENSIVE METABOLIC PANEL - Abnormal; Notable for the following components:   Glucose, Bld 106 (*)    Calcium 8.6 (*)    Albumin 3.2 (*)    All other components within normal limits  CBC WITH DIFFERENTIAL/PLATELET - Abnormal; Notable for the following components:   WBC 10.8 (*)    RBC 3.80 (*)    Hemoglobin 11.9 (*)    HCT 36.6 (*)    RDW 16.4 (*)    Eosinophils Absolute 0.7 (*)    All other components within normal limits  URINE CULTURE  LIPASE, BLOOD    EKG None  Radiology CT Renal Stone Study  Result Date: 06/04/2020 CLINICAL DATA:  Flank and lower abdominal pain.  Dysuria. EXAM: CT ABDOMEN AND PELVIS WITHOUT CONTRAST TECHNIQUE: Multidetector CT imaging of the abdomen and pelvis was performed following the standard  protocol without IV contrast. COMPARISON:  None. FINDINGS: Lower chest: No acute findings. Hepatobiliary: No mass visualized on this unenhanced exam. Gallbladder is unremarkable. No evidence of biliary ductal dilatation. Pancreas: No mass or inflammatory process visualized on this unenhanced exam. Spleen:  Within normal limits in size. Adrenals/Urinary tract: No evidence of urolithiasis or hydronephrosis. Several low-attenuation lesions are seen in both kidneys which cannot be characterized on this unenhanced exam. Unremarkable unopacified urinary  bladder. Stomach/Bowel: No evidence of dilated bowel loops. Normal appearance of appendix. Soft tissue prominence is seen in the lower rectum with adjacent perirectal soft tissue stranding. Several mildly enlarged lymph nodes are seen in the posterior and lateral perirectal space, largest measuring 8 mm on image 83/3. Differential diagnosis includes anorectal carcinoma and proctitis. Vascular/Lymphatic: Multiple perirectal lymph nodes measuring up to 8 mm. No other sites of lymphadenopathy identified. No evidence of abdominal aortic aneurysm. Reproductive:  No mass or other significant abnormality. Other:  Small right inguinal hernia which contains only fat. Musculoskeletal:  No suspicious bone lesions identified. IMPRESSION: No evidence of urolithiasis or hydronephrosis. Several low-attenuation lesions in both kidneys cannot be characterized on this unenhanced exam. Recommend abdomen MRI without and with contrast for further characterization. Soft tissue prominence in lower rectum with adjacent perirectal soft tissue stranding and several mildly enlarged perirectal lymph nodes. Differential diagnosis includes anorectal carcinoma and proctitis. Recommend correlation with rectal exam and/or proctoscopy. Electronically Signed   By: Marlaine Hind M.D.   On: 06/04/2020 12:21    Procedures Procedures (including critical care time)  Medications Ordered in ED Medications    cefTRIAXone (ROCEPHIN) 1 g in sodium chloride 0.9 % 100 mL IVPB (0 g Intravenous Stopped 06/04/20 1306)    ED Course  I have reviewed the triage vital signs and the nursing notes.  Pertinent labs & imaging results that were available during my care of the patient were reviewed by me and considered in my medical decision making (see chart for details).  Patient seen and examined.  GU exam shows normal-appearing penis with no penile discharge.  Prostate exam reveals nontender prostate.  UA shows positive nitrite and leukocyte with moderate hemoglobin.  Due to his recurrent UTI, will obtain CT to rule out renal stone or hydronephrosis or abscess.  Also obtain urine culture.  CT is negative for kidney stone or hydronephrosis.  However, also showed several low-attenuation lesions in both kidneys that require further evaluation with abdominal MRI.  CT also showed soft tissue prominence in the lower rectum which can be in a rectal carcinoma or proctitis.  Plan: Patient will be discharged home with 10 days of Bactrim.  Also follow-up with PCP, who can help him find any assistant program or orange card for specialist referrals.  Ambulatory referral to urology and GI placed.   MDM Rules/Calculators/A&P                          Patient presents to the ED for recurrent UTI.  Last 2 episode was in March and in June.  Urology referral was made but patient did not follow-up due to financial issue.  He came in today for the same symptoms of dysuria, abdominal pain and fever.  UA shows moderate hemoglobin with nitrite and leukocyte esterase.  CT abdomen pelvis negative for kidney stones or hydronephrosis.  Low suspicion for prostatitis or STD.  Patient will need urology referral for his recurrent UTI and questionable renal lesions.  Due to financial issue, patient is advised to follow-up with his PCP for any assistant programs orange card.  CT also shows soft tissue prominence at the lower rectum which can be in a  rectal carcinoma or proctitis.  GI referral also placed.  Patient will be discharged home with 10 days of Bactrim.  Come back to the ED for worsening symptoms.  Patient agrees with the plan.    Final Clinical Impression(s) / ED Diagnoses Final diagnoses:  Lower urinary tract infectious disease  Renal lesion  Proctitis    Rx / DC Orders ED Discharge Orders         Ordered    sulfamethoxazole-trimethoprim (BACTRIM DS) 800-160 MG tablet  2 times daily        06/04/20 1308    Ambulatory referral to Urology       Comments: Recurrent UTI.  Renal lesions on CT abdomen pelvis   06/04/20 1309    Ambulatory referral to Gastroenterology       Comments: CT abdomen pelvis shows soft tissue prominence in the lower rectum, differentials include anorectal carcinoma or proctitis   06/04/20 1309           Gaylan Gerold, DO 06/04/20 1339    Margette Fast, MD 06/05/20 [phone]

## 2020-06-06 LAB — URINE CULTURE: Culture: >=100000 — AB

## 2020-08-13 ENCOUNTER — Other Ambulatory Visit: Payer: Self-pay

## 2020-08-13 ENCOUNTER — Encounter: Payer: Self-pay | Admitting: Internal Medicine

## 2020-08-13 ENCOUNTER — Ambulatory Visit: Payer: Self-pay | Attending: Family Medicine | Admitting: Internal Medicine

## 2020-08-13 ENCOUNTER — Ambulatory Visit: Payer: Self-pay

## 2020-08-13 VITALS — BP 147/86 | HR 80 | Temp 98.1°F | Resp 16 | Wt 216.0 lb

## 2020-08-13 DIAGNOSIS — K6289 Other specified diseases of anus and rectum: Secondary | ICD-10-CM

## 2020-08-13 DIAGNOSIS — R935 Abnormal findings on diagnostic imaging of other abdominal regions, including retroperitoneum: Secondary | ICD-10-CM

## 2020-08-13 DIAGNOSIS — N289 Disorder of kidney and ureter, unspecified: Secondary | ICD-10-CM

## 2020-08-13 NOTE — Assessment & Plan Note (Signed)
Abnormal CT findings.  This is in association with occasional rectal bleeding.  I think he needs further evaluation by EGD and possible biopsy.  I am not sure I can tie this finding to his recurrent UTI but it is possible.

## 2020-08-13 NOTE — Progress Notes (Signed)
Patient states he thought he was seeing a gastroenterologist. Was told colon was inflamed.  Patient comes in for follow-up from the emergency department.  He was seen in October 2021.  At that time he had his second documented UTI.  In March 2021 he had an abnormal urinalysis.  In June 2021 he had a phone visit and likely had a UTI at that time as well.  And then in October he had a confirmed UTI with E. coli.  At that time he had a CT scan that raised a couple of concerns.  There were some minimal abnormal findings in the renal parenchyma and he also had what appeared to be some thickening in the rectal wall and perhaps some inflammation as well.  Today feels well.  He does admit to some rectal bleeding with a bowel movement.  This is uncommon but it does happen 1-2 times per month.  He denies any fevers or chills.  He denies any urinary symptoms.  Past Medical History:  Diagnosis Date  . Abscess, perirectal, x2  05/18/2013  . Dilated cardiomyopathy ([place]) 05/18/2013   By catheterization 2014   . Hypertension     Social History   Socioeconomic History  . Marital status: Single    Spouse name: Not on file  . Number of children: Not on file  . Years of education: Not on file  . Highest education level: Not on file  Occupational History  . Not on file  Tobacco Use  . Smoking status: Current Every Day Smoker    Packs/day: 1.00    Types: Cigarettes  . Smokeless tobacco: Never Used  Substance and Sexual Activity  . Alcohol use: Yes    Comment: occ  . Drug use: No  . Sexual activity: Not on file  Other Topics Concern  . Not on file  Social History Narrative  . Not on file   Social Determinants of Health   Financial Resource Strain: Not on file  Food Insecurity: Not on file  Transportation Needs: Not on file  Physical Activity: Not on file  Stress: Not on file  Social Connections: Not on file  Intimate Partner Violence: Not on file    Past Surgical History:  Procedure  Laterality Date  . LEFT AND RIGHT HEART CATHETERIZATION WITH CORONARY ANGIOGRAM N/A 11/21/2012   Procedure: LEFT AND RIGHT HEART CATHETERIZATION WITH CORONARY ANGIOGRAM;  Surgeon: Birdie Riddle, MD;  Location: [place] CATH LAB;  Service: Cardiovascular;  Laterality: N/A;  . MANDIBLE FRACTURE SURGERY  2006    Family History  Problem Relation Age of Onset  . Diabetes Mother   . Hypertension Other     Allergies  Allergen Reactions  . Shrimp [Shellfish Allergy] Shortness Of Breath    Current Outpatient Medications on File Prior to Visit  Medication Sig Dispense Refill  . ibuprofen (ADVIL,MOTRIN) 400 MG tablet Take 1 tablet (400 mg total) by mouth every 8 (eight) hours as needed for moderate pain. 21 tablet 0  . lisinopril (ZESTRIL) 10 MG tablet Take 1 tablet (10 mg total) by mouth daily. 30 tablet 6   No current facility-administered medications on file prior to visit.     patient denies chest pain, shortness of breath, orthopnea. Denies lower extremity edema, abdominal pain, change in appetite, change in bowel movements. Patient denies rashes, musculoskeletal complaints. No other specific complaints in a complete review of systems.   BP (!) 147/86   Pulse 80   Temp 98.1 F (36.7 C) (Oral)  Resp 16   Wt 216 lb (98 kg)   SpO2 97%   BMI 28.50 kg/m   well-developed well-nourished male in no acute distress. HEENT exam atraumatic, normocephalic, neck supple without jugular venous distention. Chest clear to auscultation cardiac exam S1-S2 are regular. Abdominal exam overweight with bowel sounds, soft and nontender. Extremities no edema. Neurologic exam is alert with a normal gait.   Renal lesion Abnormal findings on CT.  This is in association with recurrent UTI in a young male.  I think he needs further evaluation.  Radiology recommended MRI of the kidneys.  I think I will have urology see him first.  Proctitis Abnormal CT findings.  This is in association with occasional rectal  bleeding.  I think he needs further evaluation by EGD and possible biopsy.  I am not sure I can tie this finding to his recurrent UTI but it is possible.

## 2020-08-13 NOTE — Assessment & Plan Note (Signed)
Abnormal findings on CT.  This is in association with recurrent UTI in a young male.  I think he needs further evaluation.  Radiology recommended MRI of the kidneys.  I think I will have urology see him first.

## 2020-09-11 ENCOUNTER — Encounter: Payer: Self-pay | Admitting: Nurse Practitioner

## 2020-09-17 ENCOUNTER — Ambulatory Visit: Payer: Self-pay | Admitting: Urology

## 2020-09-24 NOTE — Progress Notes (Deleted)
09/24/2020 Shawn [name] [id number] 12/30/1985   CHIEF COMPLAINT:   HISTORY OF PRESENT ILLNESS:  Shawn [name] is a 35 year old male with a past medical history of hypertension, dilated cardiomyopathy, perirectal abscess x 2  He was referred to our office by Dr. Phoebe Sharps for further evaluation regarding an abnormal CT and due to rectal bleeding 1 to 2 times monthly.   He went to the ED 06/04/2020 due having abdominal pain, fever and dysuria. Previously treated for a UTI in March and June 2021. He was treated with Bactrim for recurrent UTI, urine cultures were positive for E. Coli.  He was advised to follow up with GI and urology.   CTAP without contrast 06/04/2020: -No evidence of urolithiasis or hydronephrosis. -Several low-attenuation lesions in both kidneys cannot be characterized on this unenhanced exam. Recommend abdomen MRI without and with contrast for further characterization. -Soft tissue prominence in lower rectum with adjacent perirectal soft tissue stranding and several mildly enlarged perirectal lymph nodes. Differential diagnosis includes anorectal carcinoma and proctitis. Recommend correlation with rectal exam and/or proctoscopy  CBC Latest Ref Rng & Units 06/04/2020 01/08/2020 01/04/2020  WBC 4.0 - 10.5 K/uL 10.8(H) 10.8(H) 9.2  Hemoglobin 13.0 - 17.0 g/dL 11.9(L) 11.7(L) 10.4(L)  Hematocrit 39.0 - 52.0 % 36.6(L) 35.6(L) 31.3(L)  Platelets 150 - 400 K/uL 305 379 335  MCV 96.3. RDW 16.4.   Marland Kitchenlast CMP Latest Ref Rng & Units 06/04/2020 01/08/2020 01/04/2020  Glucose 70 - 99 mg/dL 106(H) 91 115(H)  BUN 6 - 20 mg/dL 10 11 11   Creatinine 0.61 - 1.24 mg/dL 1.23 1.21 1.27(H)  Sodium 135 - 145 mmol/L 140 139 135  Potassium 3.5 - 5.1 mmol/L 4.0 3.9 3.8  Chloride 98 - 111 mmol/L 110 109 104  CO2 22 - 32 mmol/L 23 23 20(L)  Calcium 8.9 - 10.3 mg/dL 8.6(L) 8.4(L) 8.3(L)  Total Protein 6.5 - 8.1 g/dL 7.4 7.4 7.2  Total Bilirubin 0.3 - 1.2 mg/dL 0.3 0.2(L) 0.7   Alkaline Phos 38 - 126 U/L 110 99 103  AST 15 - 41 U/L 18 17 20   ALT 0 - 44 U/L 27 23 26     Past Medical History:  Diagnosis Date  . Abscess, perirectal, x2  05/18/2013  . Dilated cardiomyopathy ([place]) 05/18/2013   By catheterization 2014   . Hypertension    Past Surgical History:  Procedure Laterality Date  . LEFT AND RIGHT HEART CATHETERIZATION WITH CORONARY ANGIOGRAM N/A 11/21/2012   Procedure: LEFT AND RIGHT HEART CATHETERIZATION WITH CORONARY ANGIOGRAM;  Surgeon: Birdie Riddle, MD;  Location: [place] CATH LAB;  Service: Cardiovascular;  Laterality: N/A;  . MANDIBLE FRACTURE SURGERY  2006    Social History:   Family History:     reports that he has been smoking cigarettes. He has been smoking about 1.00 pack per day. He has never used smokeless tobacco. He reports current alcohol use. He reports that he does not use drugs. family history includes Diabetes in his mother; Hypertension in an other family member. Allergies  Allergen Reactions  . Shrimp [Shellfish Allergy] Shortness Of Breath      Outpatient Encounter Medications as of 09/25/2020  Medication Sig  . ibuprofen (ADVIL,MOTRIN) 400 MG tablet Take 1 tablet (400 mg total) by mouth every 8 (eight) hours as needed for moderate pain.  Marland Kitchen lisinopril (ZESTRIL) 10 MG tablet Take 1 tablet (10 mg total) by mouth daily.   No facility-administered encounter medications on file as of 09/25/2020.  REVIEW OF SYSTEMS:  Gen: Denies fever, sweats or chills. No weight loss.  CV: Denies chest pain, palpitations or edema. Resp: Denies cough, shortness of breath of hemoptysis.  GI: Denies heartburn, dysphagia, stomach or lower abdominal pain. No diarrhea or constipation.  GU : Denies urinary burning, blood in urine, increased urinary frequency or incontinence. MS: Denies joint pain, muscles aches or weakness. Derm: Denies rash, itchiness, skin lesions or unhealing ulcers. Psych: Denies depression, anxiety, memory loss, suicidal ideation and  confusion. Heme: Denies bruising, bleeding. Neuro:  Denies headaches, dizziness or paresthesias. Endo:  Denies any problems with DM, thyroid or adrenal function.    PHYSICAL EXAM: There were no vitals taken for this visit.   Wt Readings from Last 3 Encounters:  08/13/20 216 lb (98 kg)  06/04/20 210 lb (95.3 kg)  01/08/20 220 lb (99.8 kg)   General: Well developed ... in no acute distress. Head: Normocephalic and atraumatic. Eyes:  Sclerae non-icteric, conjunctive pink. Ears: Normal auditory acuity. Mouth: Dentition intact. No ulcers or lesions.  Neck: Supple, no lymphadenopathy or thyromegaly.  Lungs: Clear bilaterally to auscultation without wheezes, crackles or rhonchi. Heart: Regular rate and rhythm. No murmur, rub or gallop appreciated.  Abdomen: Soft, nontender, non distended. No masses. No hepatosplenomegaly. Normoactive bowel sounds x 4 quadrants.  Rectal:  Musculoskeletal: Symmetrical with no gross deformities. Skin: Warm and dry. No rash or lesions on visible extremities. Extremities: No edema. Neurological: Alert oriented x 4, no focal deficits.  Psychological:  Alert and cooperative. Normal mood and affect.  ASSESSMENT AND PLAN:    CC:  Charlott Rakes, MD

## 2020-09-25 ENCOUNTER — Ambulatory Visit: Payer: Self-pay | Admitting: Nurse Practitioner

## 2020-09-26 ENCOUNTER — Encounter (HOSPITAL_COMMUNITY): Payer: Self-pay

## 2020-09-26 ENCOUNTER — Emergency Department (HOSPITAL_COMMUNITY)
Admission: EM | Admit: 2020-09-26 | Discharge: 2020-09-26 | Disposition: A | Discharge status: Home / Self Care | Payer: Self-pay | Attending: Emergency Medicine | Admitting: Emergency Medicine

## 2020-09-26 ENCOUNTER — Emergency Department (HOSPITAL_COMMUNITY): Payer: Self-pay

## 2020-09-26 ENCOUNTER — Other Ambulatory Visit: Payer: Self-pay

## 2020-09-26 DIAGNOSIS — F17210 Nicotine dependence, cigarettes, uncomplicated: Secondary | ICD-10-CM | POA: Insufficient documentation

## 2020-09-26 DIAGNOSIS — M79642 Pain in left hand: Secondary | ICD-10-CM | POA: Insufficient documentation

## 2020-09-26 DIAGNOSIS — M7989 Other specified soft tissue disorders: Secondary | ICD-10-CM | POA: Insufficient documentation

## 2020-09-26 DIAGNOSIS — I10 Essential (primary) hypertension: Secondary | ICD-10-CM | POA: Insufficient documentation

## 2020-09-26 DIAGNOSIS — Z79899 Other long term (current) drug therapy: Secondary | ICD-10-CM | POA: Insufficient documentation

## 2020-09-26 IMAGING — DX DG HAND COMPLETE 3+V*L*
3 series · 3 of 3 positions shown · non-contrast
Comparison: None.

CLINICAL DATA: Swelling, pain for 2 weeks, no known injury

EXAM:
LEFT HAND - COMPLETE 3+ VIEW

[hand pa]
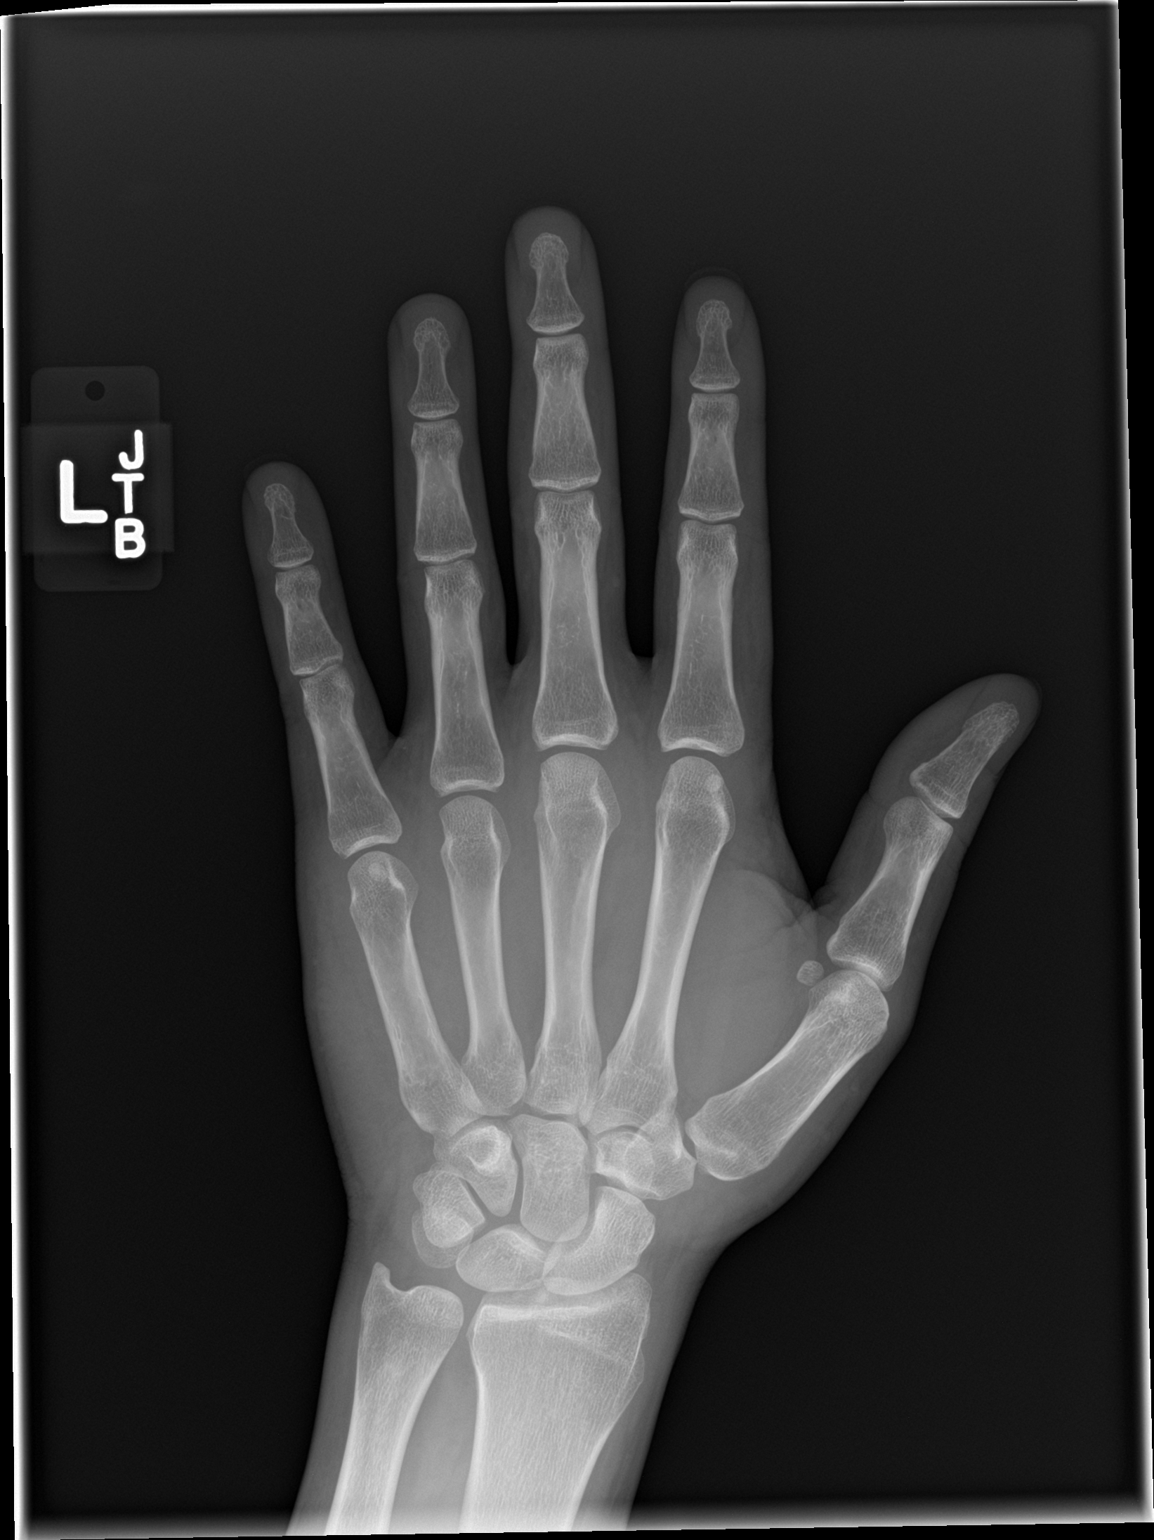

[hand obl]
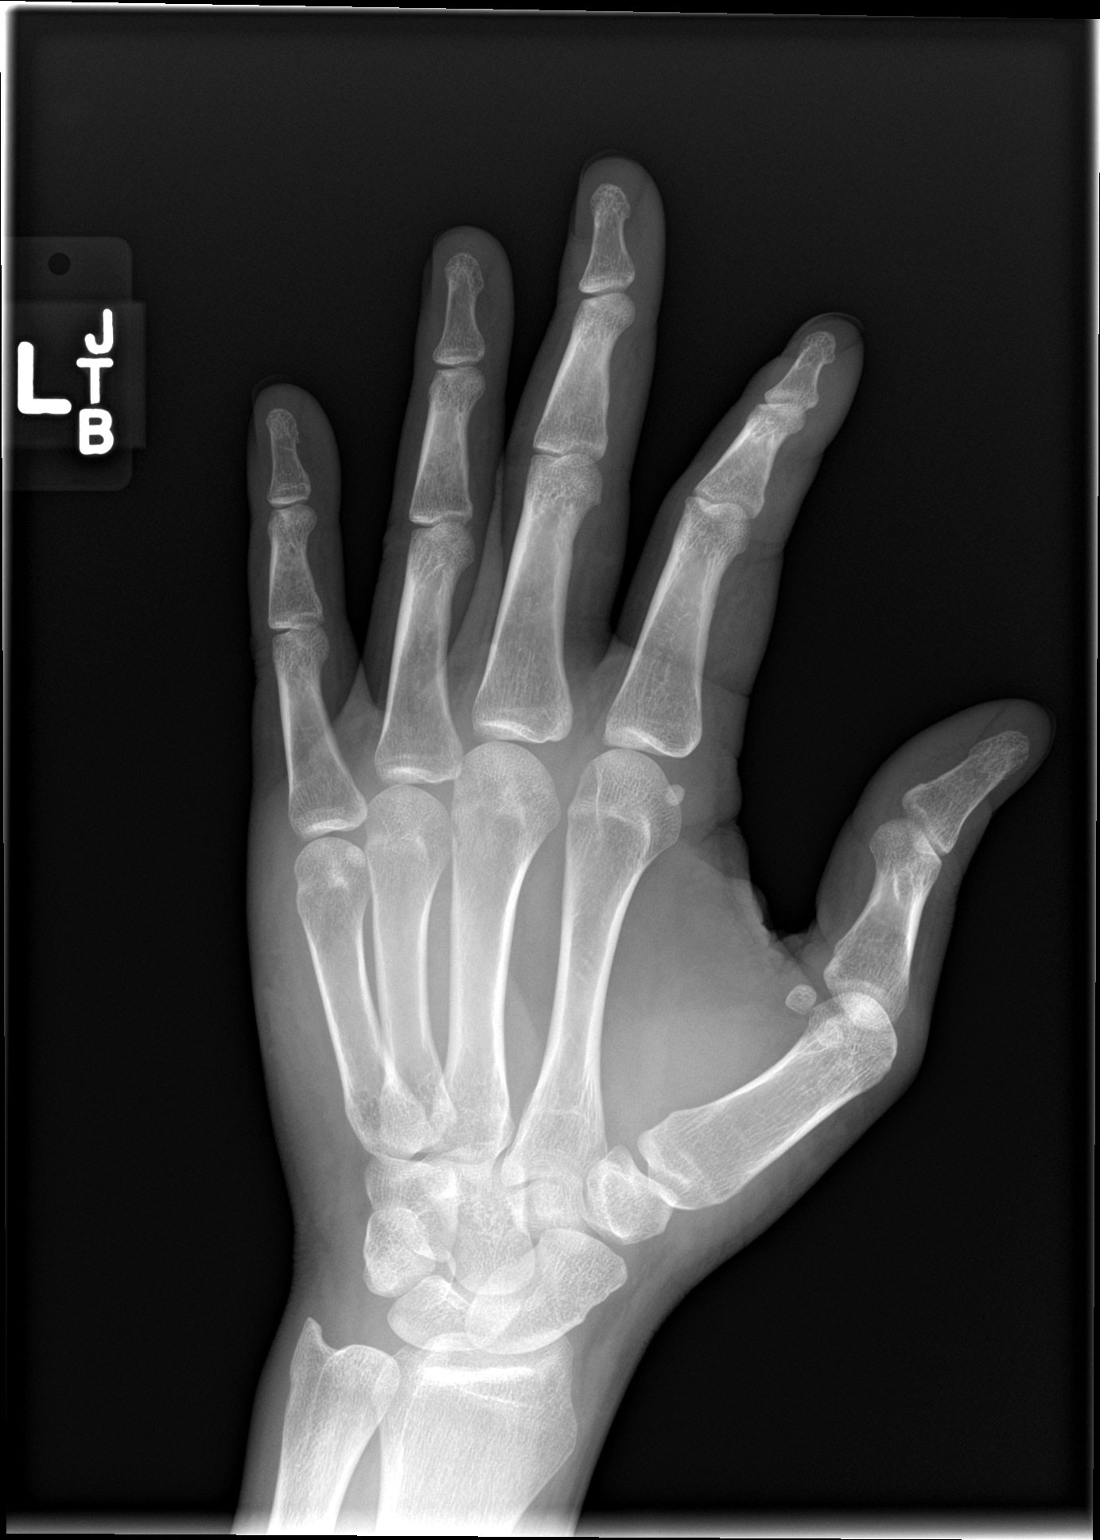

[hand lat]
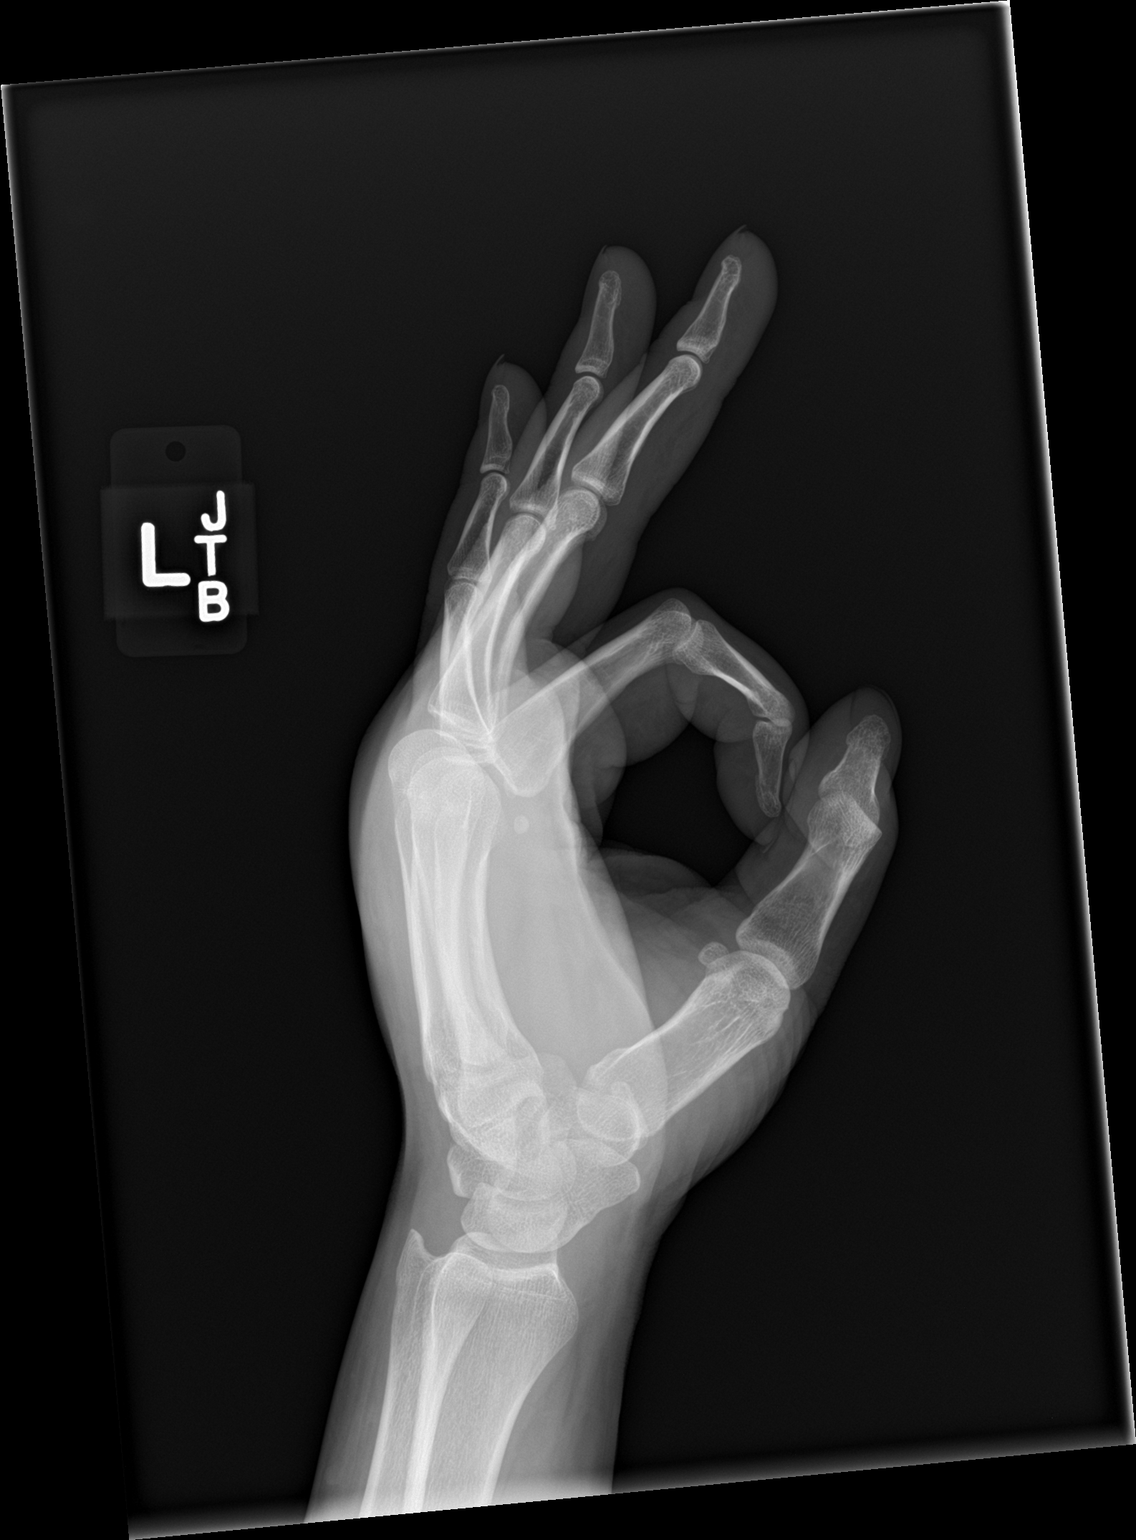

[3 of 3 positions shown; findings below may reference images not displayed]

FINDINGS: There is no evidence of fracture or dislocation. There is no
evidence of arthropathy or other focal bone abnormality. Soft
tissues are unremarkable.
IMPRESSION: No fracture or dislocation of the left hand. Joint spaces are
preserved. No radiographic findings to explain pain.

## 2020-09-26 MED ORDER — NAPROXEN 500 MG PO TABS: 500.0000 mg | ORAL_TABLET | Freq: Two times a day (BID) | ORAL | 0 refills | Status: DC

## 2020-09-26 MED ORDER — NAPROXEN 250 MG PO TABS
500.0000 mg | ORAL_TABLET | Freq: Once | ORAL | Status: AC
  Administered 2020-09-26: 500 mg via ORAL
  Filled 2020-09-26: qty 2

## 2020-09-26 NOTE — ED Triage Notes (Signed)
Pt reports left hand pain and swelling, intermittent for a while now, denies any injury or trauma. Slight swelling noted

## 2020-09-26 NOTE — ED Provider Notes (Signed)
[facility] EMERGENCY DEPARTMENT Provider Note   CSN: [id number] Arrival date & time: 09/26/20  0913     History Chief Complaint  Patient presents with  . Hand Pain    Shawn [name] is a 35 y.o. male.  Shawn [name] is a 35 y.o. male with a history of hypertension, cardiomyopathy, who presents to the ED for evaluation of left hand pain and swelling.  He reports this has been intermittent and occurring for 3 to 4 weeks.  He denies any associated injury or trauma.  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  He reports he has noted pain and some swelling primarily over the dorsum of the hand over the fourth and fifth metacarpals.  Has not noticed any redness, no cuts scrapes or injuries to this area of the hand.  Denies associated numbness or tingling.  Reports pain radiates in his fourth and fifth fingers.  He has not noted any swelling in the fingers.  He has not tried anything to treat the symptoms over the last few weeks, but reports today pain was worse so came in for evaluation.  No fevers or chills.  No other aggravating or alleviating factors.        Past Medical History:  Diagnosis Date  . Abscess, perirectal, x2  05/18/2013  . Dilated cardiomyopathy ([place]) 05/18/2013   By catheterization 2014   . Hypertension     Patient Active Problem List   Diagnosis Date Noted  . Lower urinary tract infectious disease 06/04/2020  . Renal lesion 06/04/2020  . 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 Alcohol abuse 11/06/2019  . Tobacco abuse 05/18/2013  . Dilated cardiomyopathy ([place]) 05/18/2013  . Abscess, perirectal, x2  05/18/2013  . Chest pain at rest 11/20/2012  . Hypertension 11/20/2012    Past Surgical History:  Procedure Laterality Date  . LEFT AND RIGHT HEART CATHETERIZATION WITH CORONARY ANGIOGRAM N/A 11/21/2012   Procedure: LEFT AND RIGHT HEART CATHETERIZATION WITH CORONARY ANGIOGRAM;  Surgeon: Birdie Riddle, MD;  Location: [facility]  CATH LAB;  Service: Cardiovascular;  Laterality: N/A;  . MANDIBLE FRACTURE SURGERY  2006       Family History  Problem Relation Age of Onset  . Diabetes Mother   . Hypertension Other     Social History   Tobacco Use  . Smoking status: Current Every Day Smoker    Packs/day: 1.00    Types: Cigarettes  . Smokeless tobacco: Never Used  Substance Use Topics  . Alcohol use: Yes    Comment: occ  . Drug use: No    Home Medications Prior to Admission medications   Medication Sig Start Date End Date Taking? Authorizing Provider  naproxen (NAPROSYN) 500 MG tablet Take 1 tablet (500 mg total) by mouth 2 (two) times daily. 09/26/20  Yes Jacqlyn Larsen, PA-C  ibuprofen (ADVIL,MOTRIN) 400 MG tablet Take 1 tablet (400 mg total) by mouth every 8 (eight) hours as needed for moderate pain. 10/07/18   Ripley Fraise, MD  lisinopril (ZESTRIL) 10 MG tablet Take 1 tablet (10 mg total) by mouth daily. 02/12/20 03/13/20  Charlott Rakes, MD    Allergies    Shrimp [shellfish allergy]  Review of Systems   Review of Systems  Constitutional: Negative for chills and fever.  Musculoskeletal: Positive for arthralgias and joint swelling.  Skin: Negative for color change, rash and wound.  Neurological: Negative for weakness and numbness.  All other systems reviewed and  are negative.   Physical Exam Updated Vital Signs BP 138/90   Pulse 96   Temp 98.3 F (36.8 C)   Resp 14   SpO2 97%   Physical Exam Vitals and nursing note reviewed.  Constitutional:      General: He is not in acute distress.    Appearance: Normal appearance. He is well-developed, normal weight and well-nourished. He is not ill-appearing or diaphoretic.  HENT:     Head: Normocephalic and atraumatic.  Eyes:     General:        Right eye: No discharge.        Left eye: No discharge.  Pulmonary:     Effort: Pulmonary effort is normal. No respiratory distress.  Musculoskeletal:        General: Swelling and tenderness  present. No deformity.     Comments: There is some tenderness and very mild swelling over the dorsum of the hand over the fourth and fifth metacarpals without palpable deformity, no overlying erythema, warmth, wounds or skin changes, patient able to flex and extend all fingers but reports pain with flexion and extension of the fourth and fifth metacarpals, normal strength, no tenderness or swelling over the palm or flexor surface.  2+ radial pulse and good cap refill.   Skin:    General: Skin is warm and dry.  Neurological:     Mental Status: He is alert and oriented to person, place, and time.     Coordination: Coordination normal.  Psychiatric:        Mood and Affect: Mood and affect and mood normal.        Behavior: Behavior normal.     ED Results / Procedures / Treatments   Labs (all labs ordered are listed, but only abnormal results are displayed) Labs Reviewed - No data to display  EKG None  Radiology DG Hand Complete Left  Result Date: 09/26/2020 CLINICAL DATA:  Swelling, pain for 2 weeks, no known injury EXAM: LEFT HAND - COMPLETE 3+ VIEW COMPARISON:  None. FINDINGS: There is no evidence of fracture or dislocation. There is no evidence of arthropathy or other focal bone abnormality. Soft tissues are unremarkable. IMPRESSION: No fracture or dislocation of the left hand. Joint spaces are preserved. No radiographic findings to explain pain. Electronically Signed   By: Eddie Candle M.D.   On: 09/26/2020 10:08    Procedures Procedures   Medications Ordered in ED Medications  naproxen (NAPROSYN) tablet 500 mg (500 mg Oral Given 09/26/20 1044)    ED Course  I have reviewed the triage vital signs and the nursing notes.  Pertinent labs & imaging results that were available during my care of the patient were reviewed by me and considered in my medical decision making (see chart for details).    MDM Rules/Calculators/A&P                         35 year old male presents with 3 to  4 weeks of intermittent left hand pain and swelling, swelling present over the dorsum of the hand, no associated wounds, fever, hand is neurovascularly intact.  Suspect likely tendinitis or soft tissue inflammation. Patient X-Ray negative for obvious fracture or dislocation. Pain managed in ED. Pt advised to follow up with PCP or sports med if symptoms persist for possibility of missed fracture diagnosis. Patient given brace while in ED, conservative therapy recommended and discussed. Patient will be dc home & is agreeable with above plan.  Final Clinical Impression(s) / ED Diagnoses Final diagnoses:  Left hand pain    Rx / DC Orders ED Discharge Orders         Ordered    naproxen (NAPROSYN) 500 MG tablet  2 times daily        09/26/20 1038           Jacqlyn Larsen, [place] 09/26/20 [place], DO 09/26/20 1538

## 2020-09-26 NOTE — Discharge Instructions (Signed)
Suspect pain is likely due to inflammation potentially in the tendons for your fourth and fifth fingers, x-ray shows no evidence of broken bones or other issues.  Please use brace and take Naprosyn twice daily for the next 1 to 2 weeks.  If you feel that hand pain and swelling are not improving after this time please schedule follow-up appointment with your regular doctor or with Dr. Pearletha Forge with sports medicine for further evaluation.

## 2020-10-08 ENCOUNTER — Ambulatory Visit: Payer: Self-pay | Admitting: Urology

## 2020-10-10 ENCOUNTER — Encounter: Payer: Self-pay | Admitting: Urology

## 2020-11-03 ENCOUNTER — Ambulatory Visit (HOSPITAL_COMMUNITY)
Admission: EM | Admit: 2020-11-03 | Discharge: 2020-11-03 | Disposition: A | Discharge status: Home / Self Care | Payer: 59 | Attending: Emergency Medicine | Admitting: Emergency Medicine

## 2020-11-03 ENCOUNTER — Encounter (HOSPITAL_COMMUNITY): Payer: Self-pay | Admitting: Emergency Medicine

## 2020-11-03 ENCOUNTER — Other Ambulatory Visit: Payer: Self-pay

## 2020-11-03 DIAGNOSIS — M7918 Myalgia, other site: Secondary | ICD-10-CM

## 2020-11-03 DIAGNOSIS — L02214 Cutaneous abscess of groin: Secondary | ICD-10-CM

## 2020-11-03 MED ORDER — IBUPROFEN 800 MG PO TABS: 800.0000 mg | ORAL_TABLET | Freq: Three times a day (TID) | ORAL | 0 refills | Status: DC

## 2020-11-03 MED ORDER — CYCLOBENZAPRINE HCL 5 MG PO TABS: 5.0000 mg | ORAL_TABLET | Freq: Every day | ORAL | 0 refills | Status: DC

## 2020-11-03 MED ORDER — DOXYCYCLINE HYCLATE 100 MG PO CAPS: 100.0000 mg | ORAL_CAPSULE | Freq: Two times a day (BID) | ORAL | 0 refills | Status: DC

## 2020-11-03 NOTE — Discharge Instructions (Addendum)
Take antibiotic twice daily for seven days  Warm compresses or warm soaks on abscesses at least four times to help with drainage    Take ibuprofen three times a day for the next three days and then as needed every 8 hours  Take muscle relaxer at bedtime as needed for additional comfort

## 2020-11-03 NOTE — ED Provider Notes (Addendum)
[facility]    CSN: [id number] Arrival date & time: 11/03/20  1057      History   Chief Complaint Chief Complaint  Patient presents with  . Tailbone Pain    HPI Shawn [name] is a 34 y.o. male.   Patient presents with tailbone pain 7/10 for two weeks. Pain felt while sitting,standing and lying down. Improved when pressure not directly over tailbone. Denies injury, trauma, lesion, discharge from anus, abdominal pain, flank pain, frequency or urgency. Sits in chair for most of the day at work.   C/o right groin abscess present for unknown months. Has been draining for unknown timeframe. Has history of abscesses. Tender at times. Endorses greyish discharge from penis for about 7/8 months. Denies scrotal pain, penile pain ,dysuria, frequency urgency. Sexually active. Has not had std check in last year.   Past Medical History:  Diagnosis Date  . Abscess, perirectal, x2  05/18/2013  . Dilated cardiomyopathy ([place]) 05/18/2013   By catheterization 2014   . Hypertension     Patient Active Problem List   Diagnosis Date Noted  . Lower urinary tract infectious disease 06/04/2020  . Renal lesion 06/04/2020  . 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 Alcohol abuse 11/06/2019  . Tobacco abuse 05/18/2013  . Dilated cardiomyopathy ([place]) 05/18/2013  . Abscess, perirectal, x2  05/18/2013  . Chest pain at rest 11/20/2012  . Hypertension 11/20/2012    Past Surgical History:  Procedure Laterality Date  . LEFT AND RIGHT HEART CATHETERIZATION WITH CORONARY ANGIOGRAM N/A 11/21/2012   Procedure: LEFT AND RIGHT HEART CATHETERIZATION WITH CORONARY ANGIOGRAM;  Surgeon: Birdie Riddle, MD;  Location: [facility] CATH LAB;  Service: Cardiovascular;  Laterality: N/A;  . MANDIBLE FRACTURE SURGERY  2006       Home Medications    Prior to Admission medications   Medication Sig Start Date End Date Taking? Authorizing Provider  cyclobenzaprine (FLEXERIL) 5 MG tablet Take 1 tablet (5 mg total) by mouth at bedtime.  11/03/20  Yes Hibba Schram, Leitha Schuller, NP  doxycycline (VIBRAMYCIN) 100 MG capsule Take 1 capsule (100 mg total) by mouth 2 (two) times daily. 11/03/20  Yes Tu Shimmel R, NP  ibuprofen (ADVIL) 800 MG tablet Take 1 tablet (800 mg total) by mouth 3 (three) times daily. 11/03/20   Angelene Rome, Leitha Schuller, NP  lisinopril (ZESTRIL) 10 MG tablet Take 1 tablet (10 mg total) by mouth daily. 02/12/20 03/13/20  Charlott Rakes, MD  naproxen (NAPROSYN) 500 MG tablet Take 1 tablet (500 mg total) by mouth 2 (two) times daily. 09/26/20   Jacqlyn Larsen, PA-C    Family History Family History  Problem Relation Age of Onset  . Diabetes Mother   . Hypertension Other     Social History Social History   Tobacco Use  . Smoking status: Current Every Day Smoker    Packs/day: 1.00    Types: Cigarettes  . Smokeless tobacco: Never Used  Substance Use Topics  . Alcohol use: Yes    Comment: occ  . Drug use: No     Allergies   Shrimp [shellfish allergy]   Review of Systems Review of Systems  Constitutional: Negative.   HENT: Negative.   Respiratory: Negative.   Cardiovascular: Negative.   Gastrointestinal: Negative.   Genitourinary: Positive for penile discharge. Negative for decreased urine volume, difficulty urinating, dysuria, enuresis, flank pain, frequency, genital sores, hematuria, penile pain, penile swelling, scrotal swelling, testicular pain and urgency.  Musculoskeletal: Negative.   Skin: Negative.  Physical Exam Triage Vital Signs ED Triage Vitals  Enc Vitals Group     BP 11/03/20 1205 (!) 153/85     Pulse Rate 11/03/20 1205 82     Resp 11/03/20 1205 17     Temp --      Temp src --      SpO2 11/03/20 1205 98 %     Weight --      Height --      Head Circumference --      Peak Flow --      Pain Score 11/03/20 1204 7     Pain Loc --      Pain Edu? --      Excl. in [place]? --    No data found.  Updated Vital Signs BP (!) 153/85 (BP Location: Right Arm)   Pulse 82   Resp 17   SpO2  98%   Visual Acuity Right Eye Distance:   Left Eye Distance:   Bilateral Distance:    Right Eye Near:   Left Eye Near:    Bilateral Near:     Physical Exam Constitutional:      Appearance: Normal appearance. He is normal weight.  HENT:     Head: Normocephalic.  Eyes:     Extraocular Movements: Extraocular movements intact.  Pulmonary:     Effort: Pulmonary effort is normal.  Genitourinary:    Penis: Normal.      Testes: Normal.     Rectum: Normal.  Skin:    General: Skin is warm and dry.     Comments: Abscess present on medial right thigh   Minimal purulent discharge expelled from abscess in right groin  Abscess present left perianal   Neurological:     Mental Status: He is alert and oriented to person, place, and time. Mental status is at baseline.  Psychiatric:        Mood and Affect: Mood normal.        Behavior: Behavior normal.        Thought Content: Thought content normal.        Judgment: Judgment normal.      UC Treatments / Results  Labs (all labs ordered are listed, but only abnormal results are displayed) Labs Reviewed - No data to display  EKG   Radiology No results found.  Procedures Procedures (including critical care time)  Medications Ordered in UC Medications - No data to display  Initial Impression / Assessment and Plan / UC Course  I have reviewed the triage vital signs and the nursing notes.  Pertinent labs & imaging results that were available during my care of the patient were reviewed by me and considered in my medical decision making (see chart for details).  Abscess of right groin Pain in buttocks  1. Abscess in right groin drainage, remaining two abscess offered I&D, patient declined at this time  2. Doxycycline 100 mg bid for 7 days 3. Warm compresses at least 4 times a day 4. Ibuprofen 8104m tid  5. Flexeril 5 mg at bedtime prn 6. Follow up for increased pain, continuous drainage, fever, chills,  7. greyish penile  discharge- offered sti screening- declined  Final Clinical Impressions(s) / UC Diagnoses   Final diagnoses:  Abscess of right groin  Pain in buttock     Discharge Instructions     Take antibiotic twice daily for seven days  Warm compresses or warm soaks on abscesses at least four times to help with drainage  Take ibuprofen three times a day for the next three days and then as needed every 8 hours  Take muscle relaxer at bedtime as needed for additional comfort    ED Prescriptions    Medication Sig Dispense Auth. Provider   ibuprofen (ADVIL) 800 MG tablet Take 1 tablet (800 mg total) by mouth 3 (three) times daily. 42 tablet Deaja Rizo R, NP   cyclobenzaprine (FLEXERIL) 5 MG tablet Take 1 tablet (5 mg total) by mouth at bedtime. 7 tablet Aijah Lattner R, NP   doxycycline (VIBRAMYCIN) 100 MG capsule Take 1 capsule (100 mg total) by mouth 2 (two) times daily. 14 capsule Shaterra Sanzone, Leitha Schuller, NP     PDMP not reviewed this encounter.   Hans Eden, NP 11/03/20 1322    Hans Eden, NP 11/03/20 1322

## 2020-11-03 NOTE — ED Triage Notes (Signed)
Pt presents with Tailbone pain xs 2 weeks. States has progressively gotten worse. Unable to sit, stand, or lay down without discomfort.   States has used ibuprofen for pain. Currently taking 6 tablets at a time for pain relief.

## 2020-11-18 ENCOUNTER — Encounter: Payer: Self-pay | Admitting: Family Medicine

## 2020-11-18 ENCOUNTER — Ambulatory Visit: Payer: 59 | Attending: Family Medicine | Admitting: Family Medicine

## 2020-11-18 ENCOUNTER — Other Ambulatory Visit: Payer: Self-pay

## 2020-11-18 VITALS — BP 143/95 | HR 85 | Ht 72.5 in | Wt 209.8 lb

## 2020-11-18 DIAGNOSIS — K6289 Other specified diseases of anus and rectum: Secondary | ICD-10-CM

## 2020-11-18 DIAGNOSIS — L732 Hidradenitis suppurativa: Secondary | ICD-10-CM

## 2020-11-18 DIAGNOSIS — I420 Dilated cardiomyopathy: Secondary | ICD-10-CM | POA: Diagnosis not present

## 2020-11-18 DIAGNOSIS — Z1159 Encounter for screening for other viral diseases: Secondary | ICD-10-CM

## 2020-11-18 DIAGNOSIS — N289 Disorder of kidney and ureter, unspecified: Secondary | ICD-10-CM | POA: Diagnosis not present

## 2020-11-18 MED ORDER — IBUPROFEN 800 MG PO TABS: 800.0000 mg | ORAL_TABLET | Freq: Two times a day (BID) | ORAL | 1 refills | Status: DC | PRN

## 2020-11-18 MED ORDER — LISINOPRIL 10 MG PO TABS
10.0000 mg | ORAL_TABLET | Freq: Every day | ORAL | 6 refills | Status: DC
Start: 2020-11-18 — End: 2021-04-22

## 2020-11-18 MED ORDER — HIBICLENS 4 % EX LIQD
Freq: Every day | CUTANEOUS | 0 refills | Status: DC | PRN
Start: 2020-11-18 — End: 2020-12-20

## 2020-11-18 MED ORDER — CEPHALEXIN 500 MG PO CAPS: 500.0000 mg | ORAL_CAPSULE | Freq: Two times a day (BID) | ORAL | 0 refills | Status: DC

## 2020-11-18 NOTE — Patient Instructions (Signed)
Hidradenitis Suppurativa Hidradenitis suppurativa is a long-term (chronic) skin disease. It is similar to a severe form of acne, but it affects areas of the body where acne would be unusual, especially areas of the body where skin rubs against skin and becomes moist. These include:  Underarms.  Groin.  Genital area.  Buttocks.  Upper thighs.  Breasts. Hidradenitis suppurativa may start out as small lumps or pimples caused by blocked sweat glands or hair follicles. Pimples may develop into deep sores that break open (rupture) and drain pus. Over time, affected areas of skin may thicken and become scarred. This condition is rare and does not spread from person to person (non-contagious). What are the causes? The exact cause of this condition is not known. It may be related to:  Male and male hormones.  An overactive disease-fighting system (immune system). The immune system may over-react to blocked hair follicles or sweat glands and cause swelling and pus-filled sores. What increases the risk? You are more likely to develop this condition if you:  Are male.  Are 84-77 years old.  Have a family history of hidradenitis suppurativa.  Have a personal history of acne.  Are overweight.  Smoke.  Take the medicine lithium. What are the signs or symptoms? The first symptoms are usually painful bumps in the skin, similar to pimples. The condition may get worse over time (progress), or it may only cause mild symptoms. If the disease progresses, symptoms may include:  Skin bumps getting bigger and growing deeper into the skin.  Bumps rupturing and draining pus.  Itchy, infected skin.  Skin getting thicker and scarred.  Tunnels under the skin (fistulas) where pus drains from a bump.  Pain during daily activities, such as pain during walking if your groin area is affected.  Emotional problems, such as stress or depression. This condition may affect your appearance and your  ability or willingness to wear certain clothes or do certain activities. How is this diagnosed? This condition is diagnosed by a health care provider who specializes in skin diseases (dermatologist). You may be diagnosed based on:  Your symptoms and medical history.  A physical exam.  Testing a pus sample for infection.  Blood tests. How is this treated? Your treatment will depend on how severe your symptoms are. The same treatment will not work for everybody with this condition. You may need to try several treatments to find what works best for you. Treatment may include:  Cleaning and bandaging (dressing) your wounds as needed.  Lifestyle changes, such as new skin care routines.  Taking medicines, such as: ? Antibiotics. ? Acne medicines. ? Medicines to reduce the activity of the immune system. ? A diabetes medicine (metformin). ? Birth control pills, for women. ? Steroids to reduce swelling and pain.  Working with a mental health care provider, if you experience emotional distress due to this condition. If you have severe symptoms that do not get better with medicine, you may need surgery. Surgery may involve:  Using a laser to clear the skin and remove hair follicles.  Opening and draining deep sores.  Removing the areas of skin that are diseased and scarred. Follow these instructions at home: Medicines  Take over-the-counter and prescription medicines only as told by your health care provider.  If you were prescribed an antibiotic medicine, take it as told by your health care provider. Do not stop taking the antibiotic even if your condition improves.   Skin care  If you have open wounds,  cover them with a clean dressing as told by your health care provider. Keep wounds clean by washing them gently with soap and water when you bathe.  Do not shave the areas where you get hidradenitis suppurativa.  Do not wear deodorant.  Wear loose-fitting clothes.  Try to avoid  getting overheated or sweaty. If you get sweaty or wet, change into clean, dry clothes as soon as you can.  To help relieve pain and itchiness, cover sore areas with a warm, clean washcloth (warm compress) for 5-10 minutes as often as needed.  If told by your health care provider, take a bleach bath twice a week: ? Fill your bathtub halfway with water. ? Pour in  cup of unscented household bleach. ? Soak in the tub for 5-10 minutes. ? Only soak from the neck down. Avoid water on your face and hair. ? Shower to rinse off the bleach from your skin. General instructions  Learn as much as you can about your disease so that you have an active role in your treatment. Work closely with your health care provider to find treatments that work for you.  If you are overweight, work with your health care provider to lose weight as recommended.  Do not use any products that contain nicotine or tobacco, such as cigarettes and e-cigarettes. If you need help quitting, ask your health care provider.  If you struggle with living with this condition, talk with your health care provider or work with a mental health care provider as recommended.  Keep all follow-up visits as told by your health care provider. This is important. Where to find more information  Hidradenitis [place].: https://www.hs-foundation.org/  American Academy of Dermatology: http://www.nguyen-hutchinson.com/ Contact a health care provider if you have:  A flare-up of hidradenitis suppurativa.  A fever or chills.  Trouble controlling your symptoms at home.  Trouble doing your daily activities because of your symptoms.  Trouble dealing with emotional problems related to your condition. Summary  Hidradenitis suppurativa is a long-term (chronic) skin disease. It is similar to a severe form of acne, but it affects areas of the body where acne would be unusual.  The first symptoms are usually painful bumps in the skin, similar  to pimples. The condition may only cause mild symptoms, or it may get worse over time (progress).  If you have open wounds, cover them with a clean dressing as told by your health care provider. Keep wounds clean by washing them gently with soap and water when you bathe.  Besides skin care, treatment may include medicines, laser treatment, and surgery. This information is not intended to replace advice given to you by your health care provider. Make sure you discuss any questions you have with your health care provider. Document Revised: 06/03/2020 Document Reviewed: 06/03/2020 Elsevier Patient Education  2021 [employer].

## 2020-11-18 NOTE — Progress Notes (Signed)
Subjective:  Patient ID: Shawn [name], male    DOB: 1986-07-07  Age: 35 y.o. MRN: [id number]  CC: Annual Exam   HPI Shawn [name] is a 36 year old male with a history of hypertension, dilated cardiomyopathy (diagnosed in 2014 per epic notes -nuclear stress test with dilated LV, moderate LV systolic dysfunction,  cardiac cath revealed hypertensive heart disease with mild LV systolic dysfunction and normal coronaries), alcohol abuse who presents today for an annual exam but has a lot of unresolved chronic issues hence visit was changed to chronic disease management.  At previous visit we had discussed the issue of dilated cardiomyopathy and need for cardiac work-up however he had no medical coverage at the time and could not undergo an echocardiogram to evaluate cardiac function. Denies presence of dyspnea, pedal edema or weight gain.  Last year he had recurrent UTIs and underwent CT renal stone study which revealed: IMPRESSION: No evidence of urolithiasis or hydronephrosis.  Several low-attenuation lesions in both kidneys cannot be characterized on this unenhanced exam. Recommend abdomen MRI without and with contrast for further characterization.  Soft tissue prominence in lower rectum with adjacent perirectal soft tissue stranding and several mildly enlarged perirectal lymph nodes. Differential diagnosis includes anorectal carcinoma and proctitis. Recommend correlation with rectal exam and/or proctoscopy.   He was referred to urology and GI but never followed through with appointment His tail bone hurts when he sits and stands and he has to adjust to different positions to feel comfortable.  He denies presence of hematochezia, weight loss and has no recent urinary symptoms. He also complains of recurrent boils in his right inner thigh and groin Past Medical History:  Diagnosis Date  . Abscess, perirectal, x2  05/18/2013  . Dilated cardiomyopathy ([place]) 05/18/2013   By  catheterization 2014   . Hypertension     Past Surgical History:  Procedure Laterality Date  . LEFT AND RIGHT HEART CATHETERIZATION WITH CORONARY ANGIOGRAM N/A 11/21/2012   Procedure: LEFT AND RIGHT HEART CATHETERIZATION WITH CORONARY ANGIOGRAM;  Surgeon: Birdie Riddle, MD;  Location: [facility];  Service: Cardiovascular;  Laterality: N/A;  . MANDIBLE FRACTURE SURGERY  2006    Family History  Problem Relation Age of Onset  . Diabetes Mother   . Hypertension Other     Allergies  Allergen Reactions  . Shrimp [Shellfish Allergy] Shortness Of Breath    Outpatient Medications Prior to Visit  Medication Sig Dispense Refill  . cyclobenzaprine (FLEXERIL) 5 MG tablet Take 1 tablet (5 mg total) by mouth at bedtime. 7 tablet 0  . doxycycline (VIBRAMYCIN) 100 MG capsule Take 1 capsule (100 mg total) by mouth 2 (two) times daily. 14 capsule 0  . ibuprofen (ADVIL) 800 MG tablet Take 1 tablet (800 mg total) by mouth 3 (three) times daily. 42 tablet 0  . lisinopril (ZESTRIL) 10 MG tablet Take 1 tablet (10 mg total) by mouth daily. 30 tablet 6  . naproxen (NAPROSYN) 500 MG tablet Take 1 tablet (500 mg total) by mouth 2 (two) times daily. (Patient not taking: Reported on 11/18/2020) 30 tablet 0   No facility-administered medications prior to visit.     ROS Review of Systems  Constitutional: Negative for activity change and appetite change.  HENT: Negative for sinus pressure and sore throat.   Eyes: Negative for visual disturbance.  Respiratory: Negative for cough, chest tightness and shortness of breath.   Cardiovascular: Negative for chest pain and leg swelling.  Gastrointestinal: Negative for abdominal distention, abdominal pain, constipation  and diarrhea.  Endocrine: Negative.   Genitourinary: Negative for dysuria.  Musculoskeletal: Negative for joint swelling and myalgias.  Skin:       See HPI  Allergic/Immunologic: Negative.   Neurological: Negative for weakness, light-headedness  and numbness.  Psychiatric/Behavioral: Negative for dysphoric mood and suicidal ideas.    Objective:  BP (!) 143/95 (BP Location: Right Arm)   Pulse 85   Ht 6' 0.5" (1.842 m)   Wt 209 lb 12.8 oz (95.2 kg)   SpO2 100%   BMI 28.06 kg/m   BP/Weight 11/18/2020 0/98/1191 11/27/8293  Systolic BP 621 308 657  Diastolic BP 95 85 90  Wt. (Lbs) 209.8 - -  BMI 28.06 - -  Some encounter information is confidential and restricted. Go to Review Flowsheets activity to see all data.      Physical Exam Constitutional:      Appearance: He is well-developed.  Neck:     Vascular: No JVD.  Cardiovascular:     Rate and Rhythm: Normal rate.     Heart sounds: Normal heart sounds. No murmur heard.   Pulmonary:     Effort: Pulmonary effort is normal.     Breath sounds: Normal breath sounds. No wheezing or rales.  Chest:     Chest wall: No tenderness.  Abdominal:     General: Bowel sounds are normal. There is no distension.     Palpations: Abdomen is soft. There is no mass.     Tenderness: There is no abdominal tenderness.  Musculoskeletal:        General: Normal range of motion.     Right lower leg: No edema.     Left lower leg: No edema.  Skin:    Comments: Right groin with furuncles, hyperpigmentation, abscess formation but no discharge.  Neurological:     Mental Status: He is alert and oriented to person, place, and time.  Psychiatric:        Mood and Affect: Mood normal.     CMP Latest Ref Rng & Units 06/04/2020 01/08/2020 01/04/2020  Glucose 70 - 99 mg/dL 106(H) 91 115(H)  BUN 6 - 20 mg/dL 10 11 11   Creatinine 0.61 - 1.24 mg/dL 1.23 1.21 1.27(H)  Sodium 135 - 145 mmol/L 140 139 135  Potassium 3.5 - 5.1 mmol/L 4.0 3.9 3.8  Chloride 98 - 111 mmol/L 110 109 104  CO2 22 - 32 mmol/L 23 23 20(L)  Calcium 8.9 - 10.3 mg/dL 8.6(L) 8.4(L) 8.3(L)  Total Protein 6.5 - 8.1 g/dL 7.4 7.4 7.2  Total Bilirubin 0.3 - 1.2 mg/dL 0.3 0.2(L) 0.7  Alkaline Phos 38 - 126 U/L 110 99 103  AST 15 - 41  U/L 18 17 20   ALT 0 - 44 U/L 27 23 26     Lipid Panel     Component Value Date/Time   CHOL 88 11/20/2012 0439   TRIG 31 11/20/2012 0439   HDL 28 (L) 11/20/2012 0439   CHOLHDL 3.1 11/20/2012 0439   VLDL 6 11/20/2012 0439   LDLCALC 54 11/20/2012 0439    CBC    Component Value Date/Time   WBC 10.8 (H) 06/04/2020 1148   RBC 3.80 (L) 06/04/2020 1148   HGB 11.9 (L) 06/04/2020 1148   HCT 36.6 (L) 06/04/2020 1148   PLT 305 06/04/2020 1148   MCV 96.3 06/04/2020 1148   MCH 31.3 06/04/2020 1148   MCHC 32.5 06/04/2020 1148   RDW 16.4 (H) 06/04/2020 1148   LYMPHSABS 2.2 06/04/2020 1148   MONOABS  0.6 06/04/2020 1148   EOSABS 0.7 (H) 06/04/2020 1148   BASOSABS 0.1 06/04/2020 1148    No results found for: HGBA1C  Assessment & Plan:  1. Dilated cardiomyopathy ([place]) We will need to evaluate cardiac function - ECHOCARDIOGRAM COMPLETE; Future - lisinopril (ZESTRIL) 10 MG tablet; Take 1 tablet (10 mg total) by mouth daily.  Dispense: 30 tablet; Refill: 6 - CMP14+EGFR - Lipid panel  2. Rectal mass Previously referred to GI but he never kept appointment Will need to be evaluated for malignancy - Ambulatory referral to General Surgery - ibuprofen (ADVIL) 800 MG tablet; Take 1 tablet (800 mg total) by mouth 2 (two) times daily as needed.  Dispense: 60 tablet; Refill: 1  3. Renal lesion - Ambulatory referral to Urology  4. Hidradenitis Counseled on pathophysiology of Hydradenitis Suppurativa - cephALEXin (KEFLEX) 500 MG capsule; Take 1 capsule (500 mg total) by mouth 2 (two) times daily.  Dispense: 14 capsule; Refill: 0 - chlorhexidine (HIBICLENS) 4 % external liquid; Apply topically daily as needed.  Dispense: 120 mL; Refill: 0  5. Screening for viral disease - HCV RNA quant rflx ultra or genotyp([facility]) - HIV Antibody (routine testing w rflx)   No orders of the defined types were placed in this encounter.   Return in about 6 weeks (around 12/30/2020) for coordination  of care.        Charlott Rakes, MD, FAAFP. [facility], [place]   11/18/2020, 10:37 AM

## 2020-11-19 LAB — HCV RNA QUANT RFLX ULTRA OR GENOTYP: HCV Quant Baseline: NOT DETECTED IU/mL

## 2020-11-19 LAB — CMP14+EGFR
ALT: 12 IU/L (ref 0–44)
AST: 15 IU/L (ref 0–40)
Albumin/Globulin Ratio: 1.1 — ABNORMAL LOW (ref 1.2–2.2)
Albumin: 4 g/dL (ref 4.0–5.0)
Alkaline Phosphatase: 127 IU/L — ABNORMAL HIGH (ref 44–121)
BUN/Creatinine Ratio: 12 (ref 9–20)
BUN: 17 mg/dL (ref 6–20)
Bilirubin Total: 0.3 mg/dL (ref 0.0–1.2)
CO2: 16 mmol/L — ABNORMAL LOW (ref 20–29)
Calcium: 9.1 mg/dL (ref 8.7–10.2)
Chloride: 107 mmol/L — ABNORMAL HIGH (ref 96–106)
Creatinine, Ser: 1.37 mg/dL — ABNORMAL HIGH (ref 0.76–1.27)
Globulin, Total: 3.8 g/dL (ref 1.5–4.5)
Glucose: 88 mg/dL (ref 65–99)
Potassium: 4.2 mmol/L (ref 3.5–5.2)
Sodium: 141 mmol/L (ref 134–144)
Total Protein: 7.8 g/dL (ref 6.0–8.5)
eGFR: 69 mL/min/{1.73_m2} (ref >59)

## 2020-11-19 LAB — LIPID PANEL
Chol/HDL Ratio: 3.6 ratio (ref 0.0–5.0)
Cholesterol, Total: 119 mg/dL (ref 100–199)
HDL: 33 mg/dL — ABNORMAL LOW (ref >39)
LDL Chol Calc (NIH): 65 mg/dL (ref 0–99)
Triglycerides: 111 mg/dL (ref 0–149)
VLDL Cholesterol Cal: 21 mg/dL (ref 5–40)

## 2020-11-19 LAB — HIV ANTIBODY (ROUTINE TESTING W REFLEX): HIV Screen 4th Generation wRfx: NONREACTIVE

## 2020-11-20 ENCOUNTER — Ambulatory Visit: Payer: Self-pay | Admitting: Surgery

## 2020-11-21 ENCOUNTER — Telehealth: Payer: Self-pay

## 2020-11-21 NOTE — Telephone Encounter (Signed)
Pt was called and a VM was left informing patient of lab results.

## 2020-11-21 NOTE — Telephone Encounter (Signed)
-----   Message from Charlott Rakes, MD sent at 11/20/2020 12:50 PM EDT ----- Please inform him that his cholesterol is normal, other labs are stable.

## 2020-11-25 ENCOUNTER — Encounter: Payer: Self-pay | Admitting: *Deleted

## 2020-12-19 ENCOUNTER — Other Ambulatory Visit: Payer: Self-pay

## 2020-12-19 ENCOUNTER — Ambulatory Visit (HOSPITAL_COMMUNITY)
Admission: RE | Admit: 2020-12-19 | Discharge: 2020-12-19 | Disposition: A | Discharge status: Home / Self Care | Payer: 59 | Source: Ambulatory Visit | Attending: Family Medicine | Admitting: Family Medicine

## 2020-12-19 DIAGNOSIS — I420 Dilated cardiomyopathy: Secondary | ICD-10-CM | POA: Insufficient documentation

## 2020-12-19 DIAGNOSIS — I10 Essential (primary) hypertension: Secondary | ICD-10-CM | POA: Insufficient documentation

## 2020-12-19 DIAGNOSIS — Z720 Tobacco use: Secondary | ICD-10-CM | POA: Insufficient documentation

## 2020-12-19 LAB — ECHOCARDIOGRAM COMPLETE
AR max vel: 3.57 cm2
AV Area VTI: 3.64 cm2
AV Area mean vel: 3.35 cm2
AV Mean grad: 3 mmHg
AV Peak grad: 5.2 mmHg
Ao pk vel: 1.14 m/s
Area-P 1/2: 4.39 cm2
MV VTI: 4.54 cm2
S' Lateral: 2.5 cm

## 2020-12-19 NOTE — Progress Notes (Signed)
  Echocardiogram 2D Echocardiogram has been performed.  Shawn [name] 12/19/2020, 3:48 PM

## 2020-12-20 ENCOUNTER — Other Ambulatory Visit: Payer: Self-pay

## 2020-12-20 ENCOUNTER — Encounter (HOSPITAL_COMMUNITY): Payer: Self-pay | Admitting: Emergency Medicine

## 2020-12-20 ENCOUNTER — Emergency Department (HOSPITAL_COMMUNITY): Payer: 59

## 2020-12-20 ENCOUNTER — Emergency Department (HOSPITAL_COMMUNITY)
Admission: EM | Admit: 2020-12-20 | Discharge: 2020-12-21 | Disposition: A | Discharge status: Home / Self Care | Payer: 59 | Attending: Emergency Medicine | Admitting: Emergency Medicine

## 2020-12-20 DIAGNOSIS — M546 Pain in thoracic spine: Secondary | ICD-10-CM | POA: Diagnosis not present

## 2020-12-20 DIAGNOSIS — I10 Essential (primary) hypertension: Secondary | ICD-10-CM | POA: Diagnosis not present

## 2020-12-20 DIAGNOSIS — R590 Localized enlarged lymph nodes: Secondary | ICD-10-CM

## 2020-12-20 DIAGNOSIS — F17210 Nicotine dependence, cigarettes, uncomplicated: Secondary | ICD-10-CM | POA: Diagnosis not present

## 2020-12-20 DIAGNOSIS — K6289 Other specified diseases of anus and rectum: Secondary | ICD-10-CM | POA: Diagnosis present

## 2020-12-20 DIAGNOSIS — R591 Generalized enlarged lymph nodes: Secondary | ICD-10-CM | POA: Insufficient documentation

## 2020-12-20 LAB — COMPREHENSIVE METABOLIC PANEL
ALT: 22 U/L (ref 0–44)
AST: 29 U/L (ref 15–41)
Albumin: 3.8 g/dL (ref 3.5–5.0)
Alkaline Phosphatase: 109 U/L (ref 38–126)
Anion gap: 10 (ref 5–15)
BUN: 24 mg/dL — ABNORMAL HIGH (ref 6–20)
CO2: 18 mmol/L — ABNORMAL LOW (ref 22–32)
Calcium: 8.7 mg/dL — ABNORMAL LOW (ref 8.9–10.3)
Chloride: 108 mmol/L (ref 98–111)
Creatinine, Ser: 1.52 mg/dL — ABNORMAL HIGH (ref 0.61–1.24)
GFR, Estimated: >60 mL/min (ref >60)
Glucose, Bld: 85 mg/dL (ref 70–99)
Potassium: 5.2 mmol/L — ABNORMAL HIGH (ref 3.5–5.1)
Sodium: 136 mmol/L (ref 135–145)
Total Bilirubin: 1.1 mg/dL (ref 0.3–1.2)
Total Protein: 8.2 g/dL — ABNORMAL HIGH (ref 6.5–8.1)

## 2020-12-20 LAB — CBC WITH DIFFERENTIAL/PLATELET
Abs Immature Granulocytes: 0.05 10*3/uL (ref 0.00–0.07)
Basophils Absolute: 0.1 10*3/uL (ref 0.0–0.1)
Basophils Relative: 0 %
Eosinophils Absolute: 0.4 10*3/uL (ref 0.0–0.5)
Eosinophils Relative: 3 %
HCT: 32.6 % — ABNORMAL LOW (ref 39.0–52.0)
Hemoglobin: 10.8 g/dL — ABNORMAL LOW (ref 13.0–17.0)
Immature Granulocytes: 0 %
Lymphocytes Relative: 24 %
Lymphs Abs: 3.1 10*3/uL (ref 0.7–4.0)
MCH: 34.7 pg — ABNORMAL HIGH (ref 26.0–34.0)
MCHC: 33.1 g/dL (ref 30.0–36.0)
MCV: 104.8 fL — ABNORMAL HIGH (ref 80.0–100.0)
Monocytes Absolute: 0.8 10*3/uL (ref 0.1–1.0)
Monocytes Relative: 6 %
Neutro Abs: 8.5 10*3/uL — ABNORMAL HIGH (ref 1.7–7.7)
Neutrophils Relative %: 67 %
Platelets: 346 10*3/uL (ref 150–400)
RBC: 3.11 MIL/uL — ABNORMAL LOW (ref 4.22–5.81)
RDW: 19.2 % — ABNORMAL HIGH (ref 11.5–15.5)
WBC: 12.9 10*3/uL — ABNORMAL HIGH (ref 4.0–10.5)
nRBC: 0 % (ref 0.0–0.2)

## 2020-12-20 LAB — HIV ANTIBODY (ROUTINE TESTING W REFLEX): HIV Screen 4th Generation wRfx: NONREACTIVE

## 2020-12-20 LAB — URINALYSIS, ROUTINE W REFLEX MICROSCOPIC
Bilirubin Urine: NEGATIVE
Glucose, UA: NEGATIVE mg/dL
Hgb urine dipstick: NEGATIVE
Ketones, ur: NEGATIVE mg/dL
Leukocytes,Ua: NEGATIVE
Nitrite: NEGATIVE
Protein, ur: NEGATIVE mg/dL
Specific Gravity, Urine: 1.039 — ABNORMAL HIGH (ref 1.005–1.030)
pH: 5 (ref 5.0–8.0)

## 2020-12-20 IMAGING — CT CT PELVIS W/ CM
2 of 3 series · 17 of 46 positions shown, 19 images · IV contrast (omnipaque)
Comparison: [DATE]

CLINICAL DATA: Rectal abscess.

EXAM:
CT PELVIS WITH CONTRAST
TECHNIQUE: Multidetector CT imaging of the pelvis was performed using the
standard protocol following the bolus administration of intravenous
contrast.
CONTRAST:  100mL OMNIPAQUE IOHEXOL 300 MG/ML  SOLN

[Series 3: abd/ pelvis 5.0 i30f 2 · axial · 0.84mm/px · z∈[-126,+134]mm · 14 of 60 slices shown, 16 images]
[im 4/60  soft-tissue]
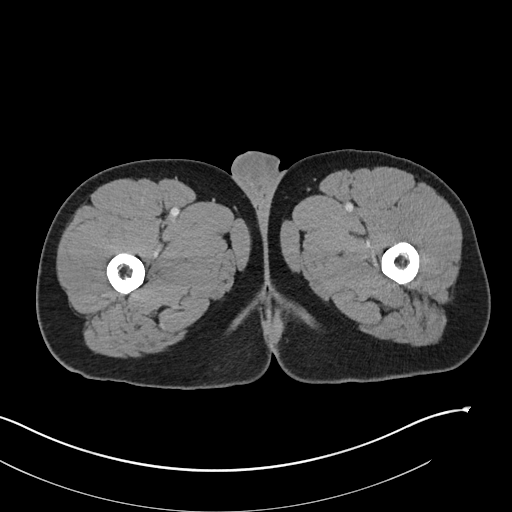
[im 4/60  bone]
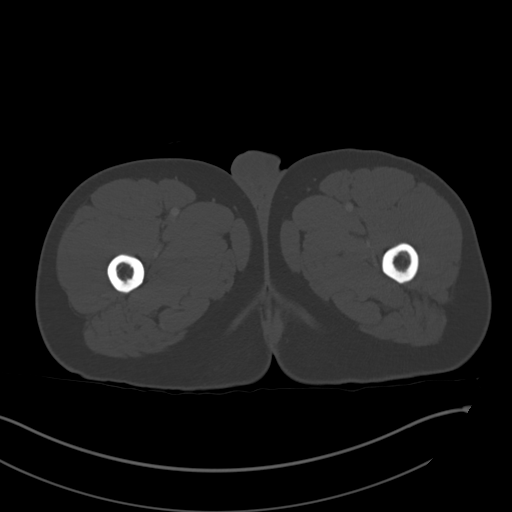
[im 8/60  soft-tissue]
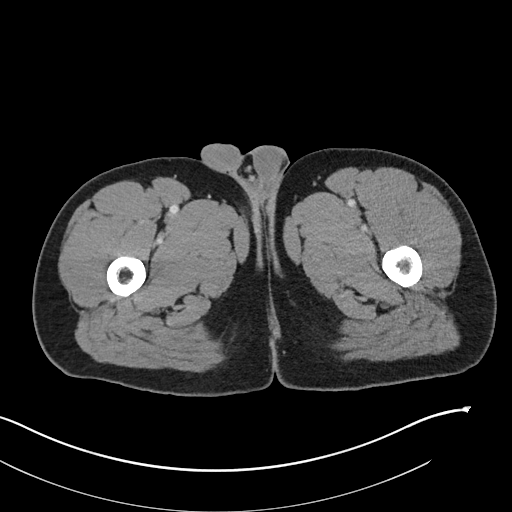
[im 12/60  soft-tissue]
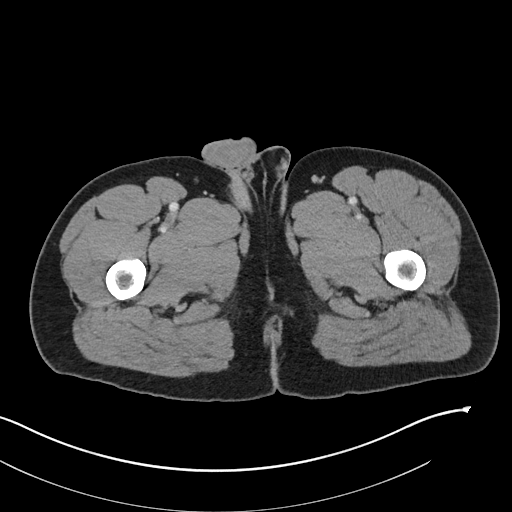
[im 16/60  soft-tissue]
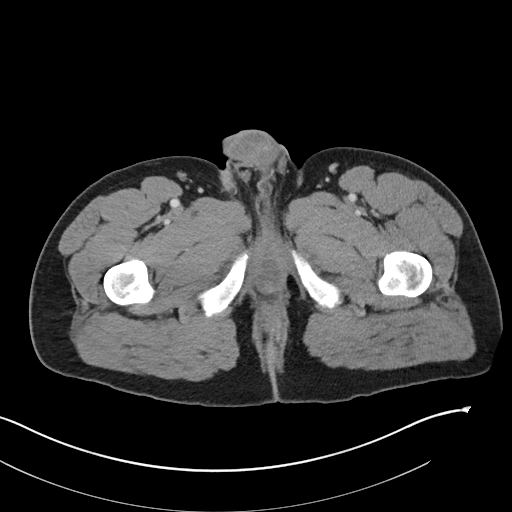
[im 20/60  soft-tissue]
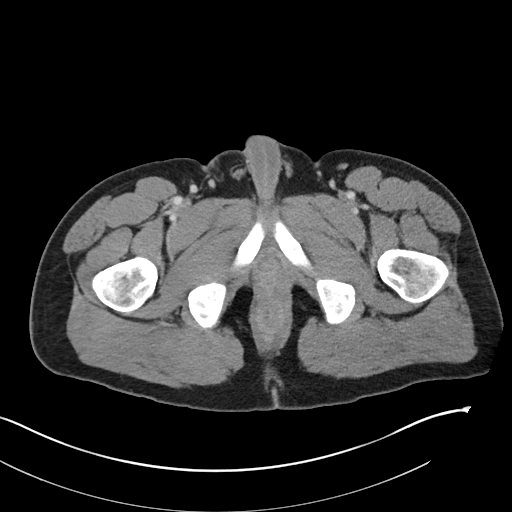
[im 23/60  soft-tissue]
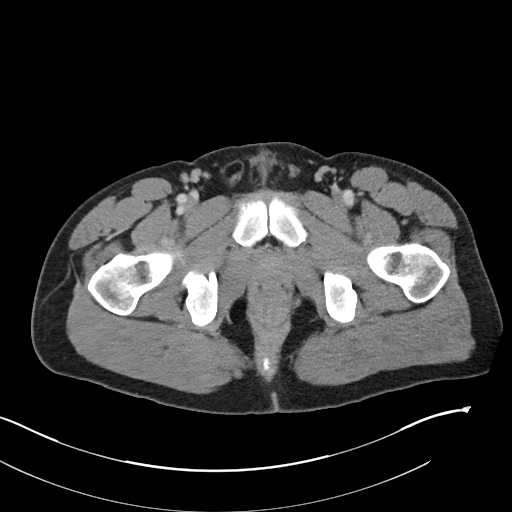
[im 27/60  soft-tissue]
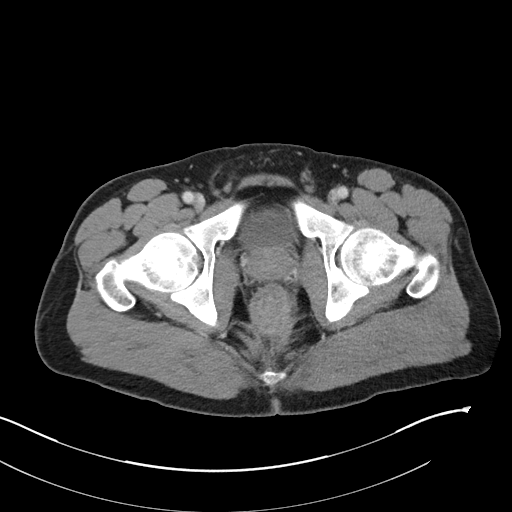
[im 33/60  soft-tissue]
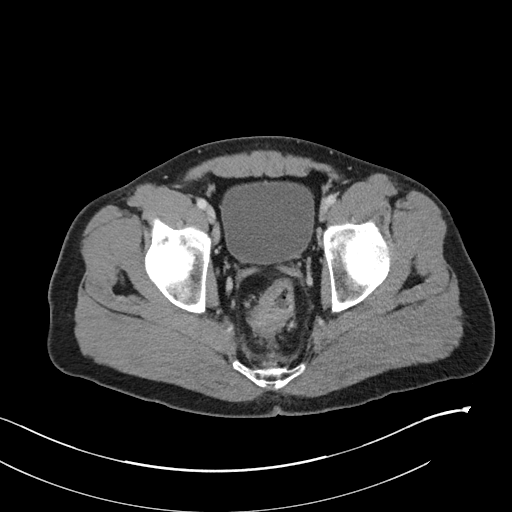
[im 37/60  soft-tissue]
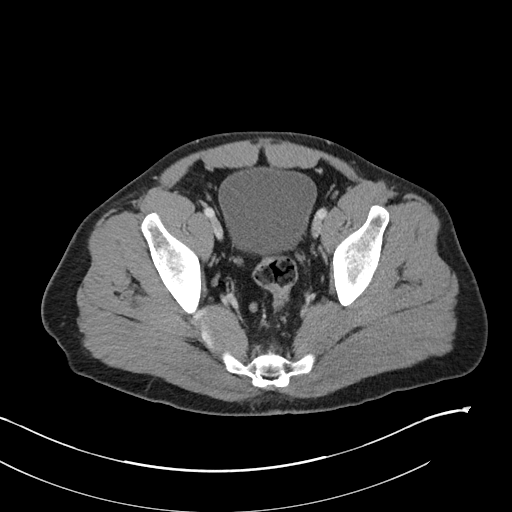
[im 37/60  bone]
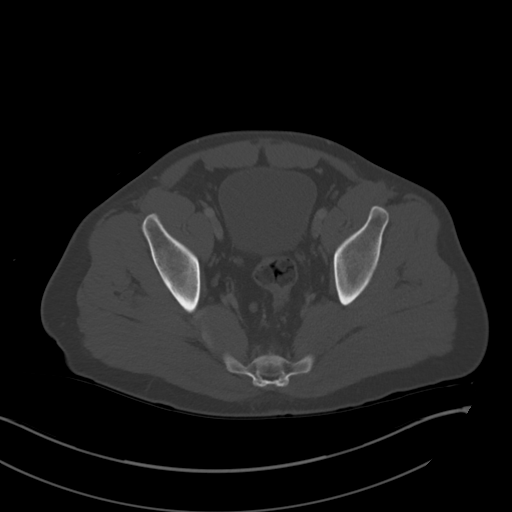
[im 40/60  soft-tissue]
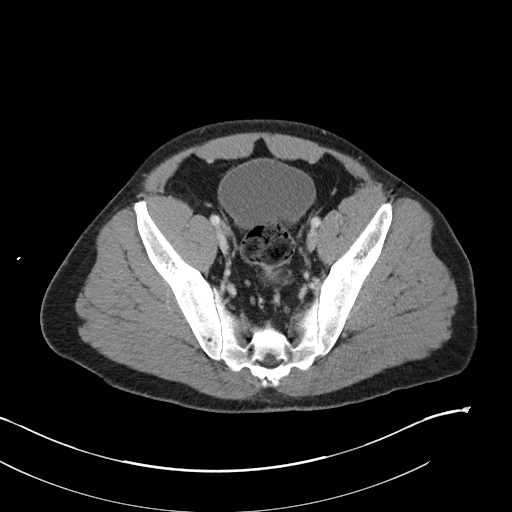
[im 44/60  soft-tissue]
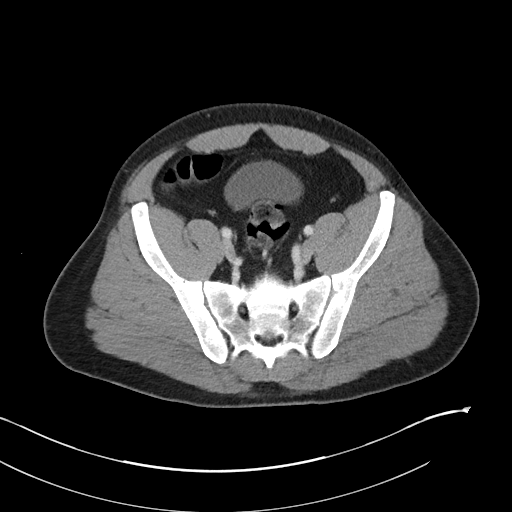
[im 48/60  soft-tissue]
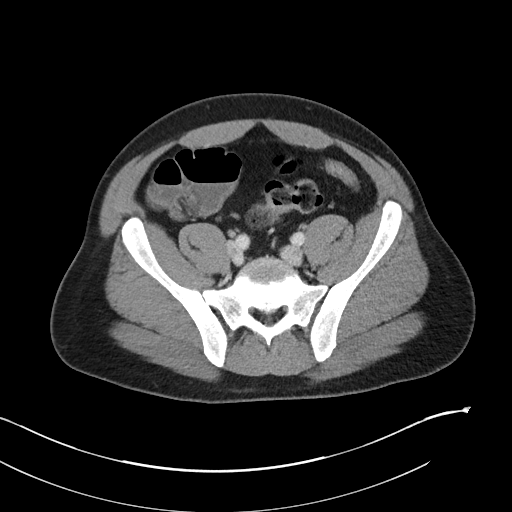
[im 52/60  soft-tissue]
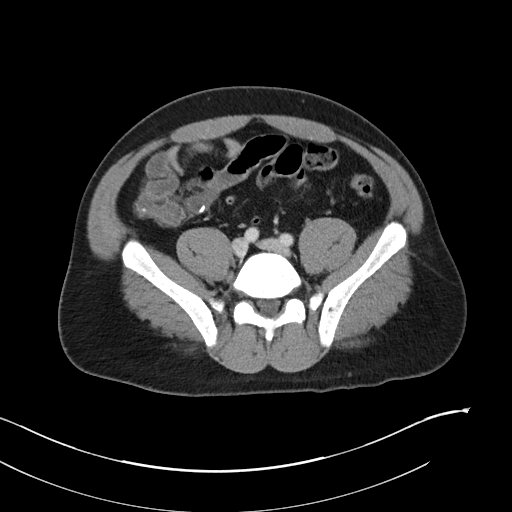
[im 56/60  soft-tissue]
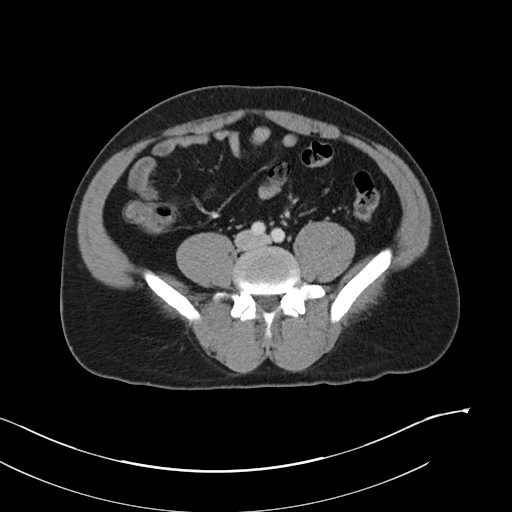

[Series 5: coronal soft tissue · coronal · 0.63mm/px · 3 of 92 slices shown]
[im 31/92  soft-tissue]
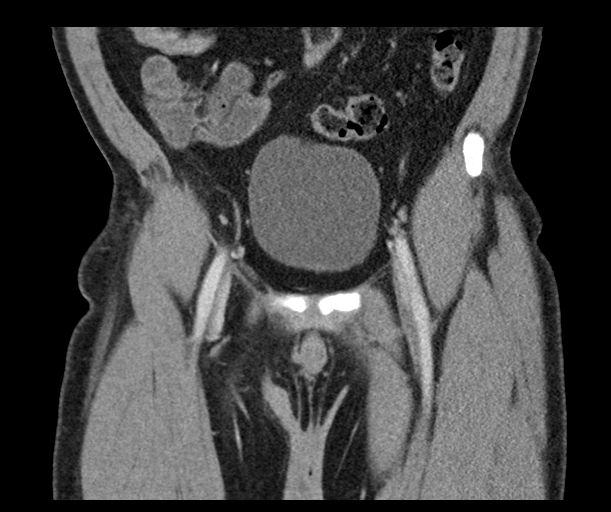
[im 41/92  soft-tissue]
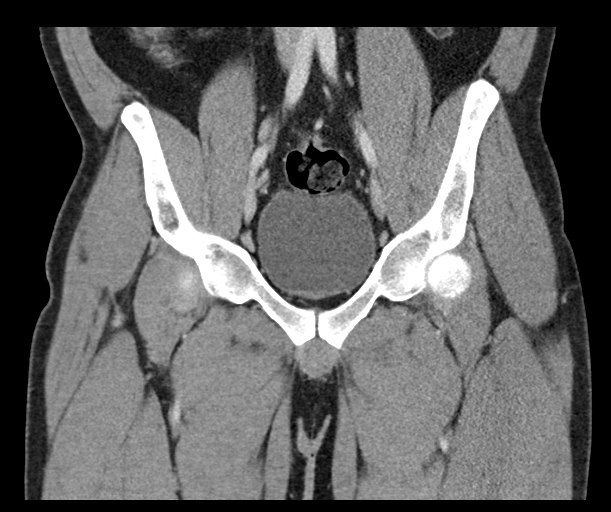
[im 51/92  soft-tissue]
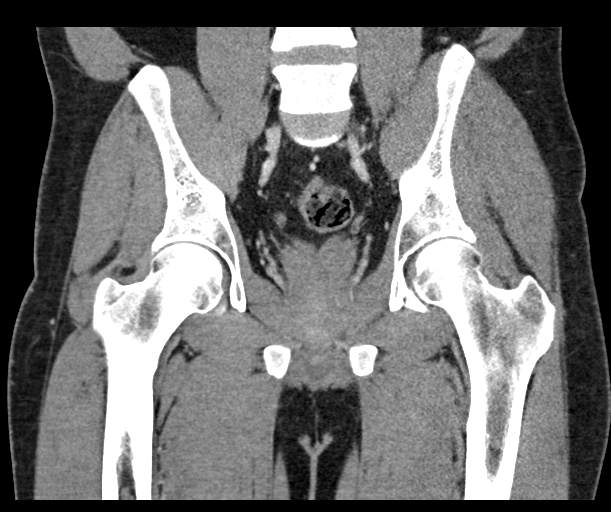

[17 of 46 positions shown; findings below may reference images not displayed]

FINDINGS: Urinary Tract:  No abnormality visualized.

Bowel: Again noted is diffuse wall thickening of the rectum with
adjacent fat stranding and mildly enlarged regional lymph nodes.
There is a small amount of free fluid in the presacral space. There
is a questionable anorectal fistula on the left without evidence for
an associated abscess. This would be better evaluated by MRI.

Vascular/Lymphatic: Enlarged perirectal lymph nodes are again noted.
There is no significant vascular abnormality detected on this exam.

Reproductive:  No mass or other significant abnormality

Other:  There is a fat containing right inguinal hernia.

Musculoskeletal: No suspicious bone lesions identified.
IMPRESSION: 1. Similar appearance of the rectum which may be secondary to
proctitis or underlying carcinoma. Outpatient GI follow-up is
recommended for further evaluation of this persistent finding.
2. There is a possible anorectal fistula on the left without
evidence for an associated abscess.

## 2020-12-20 IMAGING — MR MR PELVIS WO/W CM
10 of 13 series · 36 of 48 positions shown · IV contrast (Gadavist)
Comparison: CT pelvis earlier same day.  CT stone study [DATE]

CLINICAL DATA: Rectal mass.

EXAM:
MRI PELVIS WITHOUT AND WITH CONTRAST
TECHNIQUE: Multiplanar multisequence MR imaging of the pelvis was performed
both before and after administration of intravenous contrast.
CONTRAST:  10mL GADAVIST GADOBUTROL 1 MMOL/ML IV SOLN

[Series 2: T2 · coronal · 5.0mm · 1.41mm/px · 2 of 38 slices shown (1 of 2)]
[im 1/38]
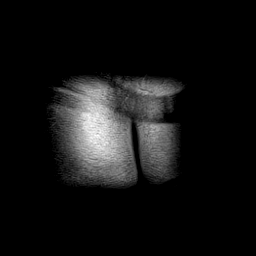
[im 38/38]
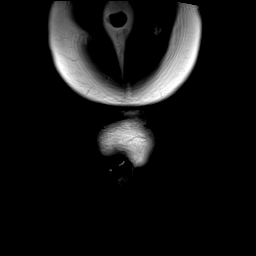

[Series 3: T1 · axial · 4.0mm · 0.74mm/px · z∈[-138,+117]mm · 3 of 52 slices shown (1 of 2)]
[im 1/52]
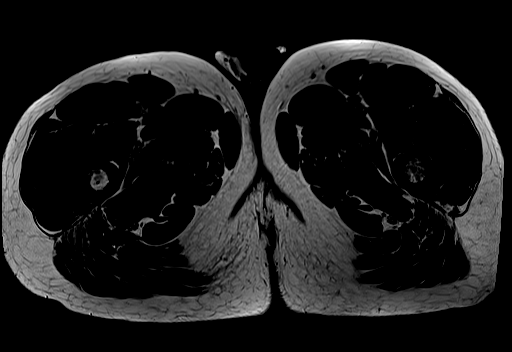
[im 26/52]
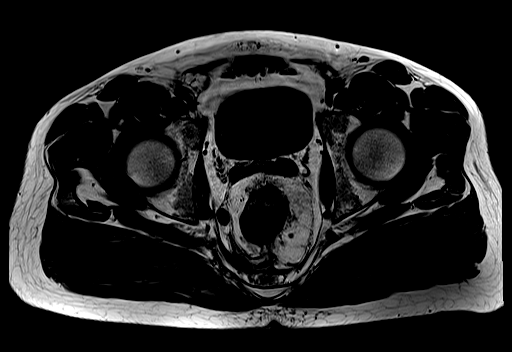
[im 52/52]
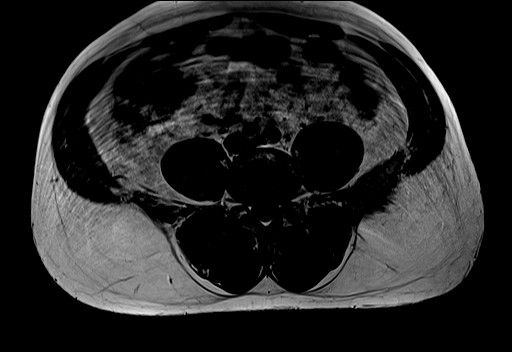

[Series 4: STIR · coronal · 4.0mm · 1.04mm/px · 3 of 40 slices shown]
[im 1/40]
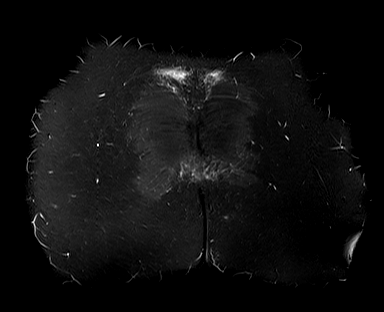
[im 20/40]
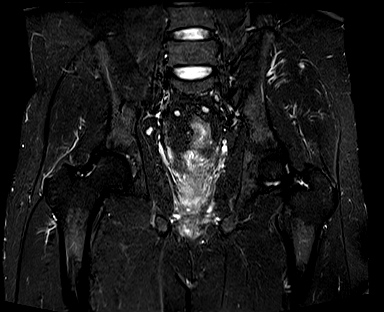
[im 40/40]
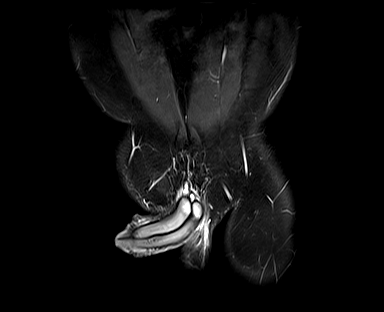

[Series 5: T1 · coronal · 4.0mm · 1.04mm/px · 3 of 40 slices shown (2 of 2)]
[im 1/40]
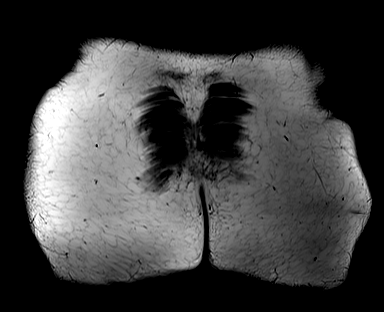
[im 20/40]
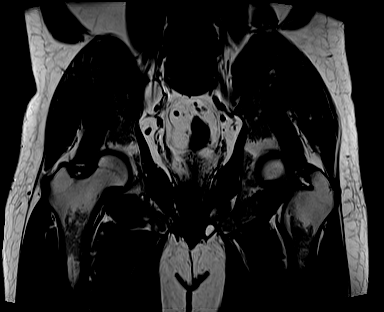
[im 40/40]
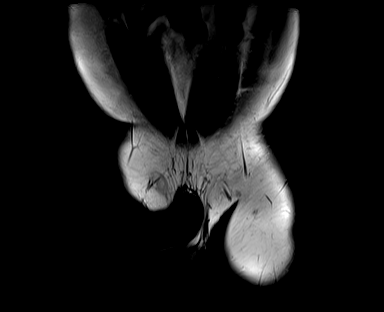

[Series 6: T2 fat-sat · sagittal · 4.0mm · 0.85mm/px · 3 of 40 slices shown (1 of 2)]
[im 1/40]
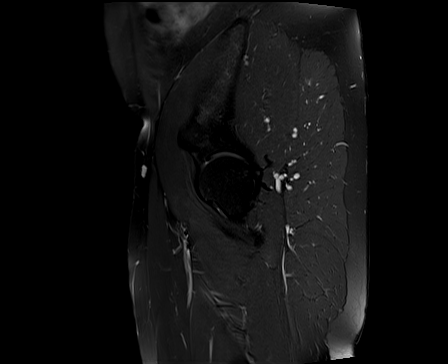
[im 20/40]
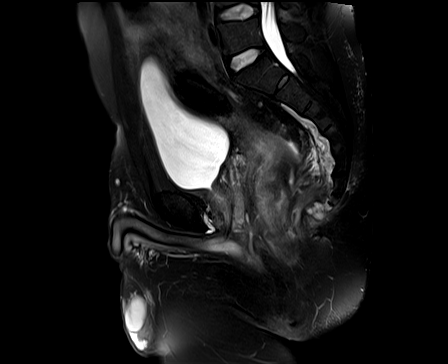
[im 40/40]
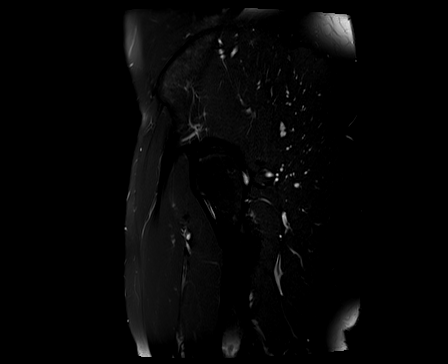

[Series 7: T2 fat-sat · axial · 4.0mm · 0.74mm/px · z∈[-133,+112]mm · 4 of 50 slices shown (2 of 2)]
[im 1/50]
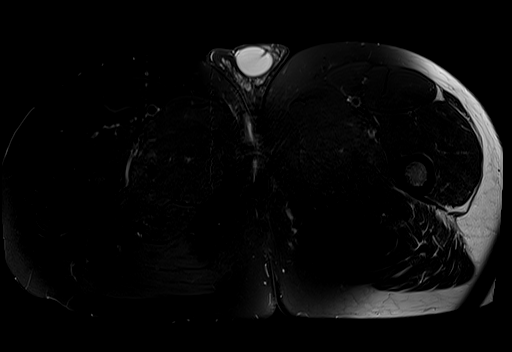
[im 17/50]
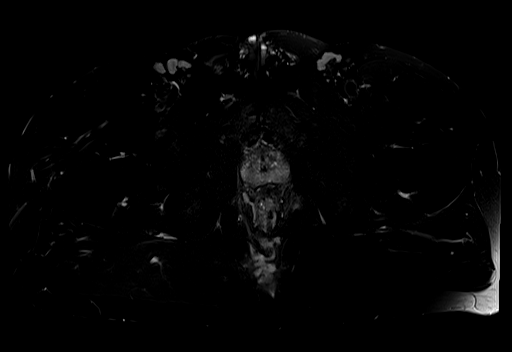
[im 33/50]
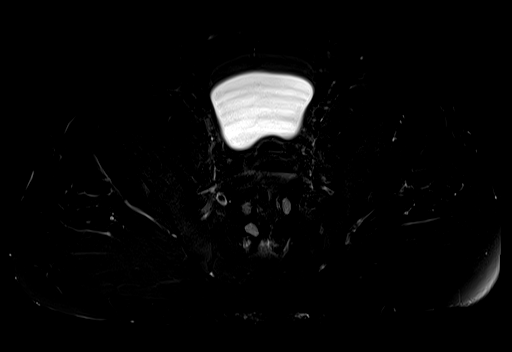
[im 50/50]
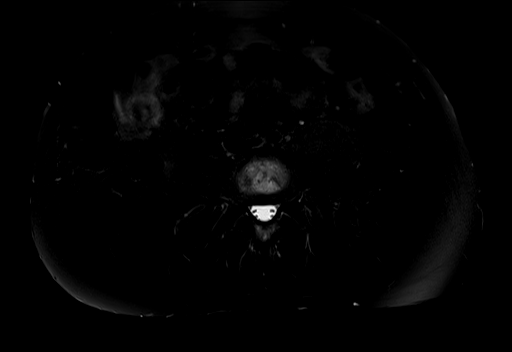

[Series 8: T1 fat-sat post-contrast · axial · non-contrast · 4.0mm · 0.99mm/px · z∈[-133,+112]mm · 4 of 50 slices shown (1 of 2)]
[im 1/50]
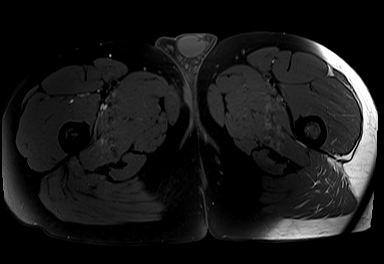
[im 17/50]
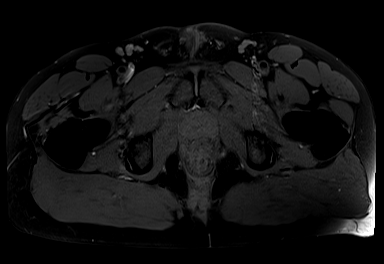
[im 33/50]
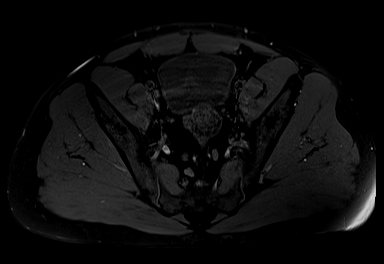
[im 50/50]
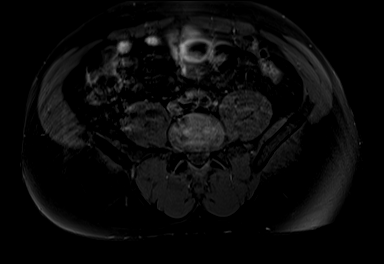

[Series 9: t2_space_sag_(id) · sagittal · 1.5mm · 0.78mm/px · 6 of 88 slices shown]
[im 1/88]
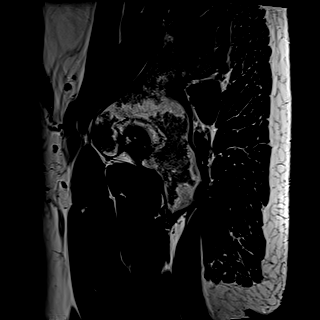
[im 18/88]
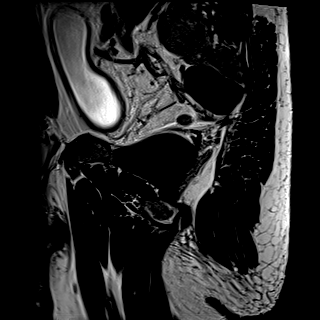
[im 35/88]
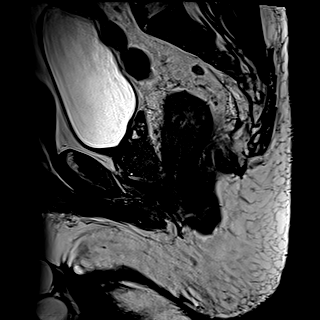
[im 53/88]
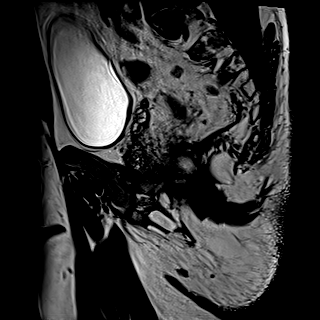
[im 70/88]
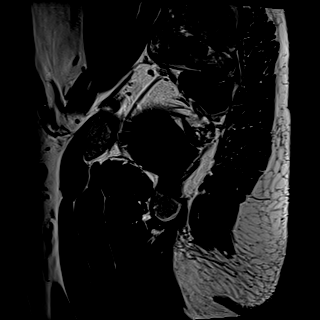
[im 88/88]
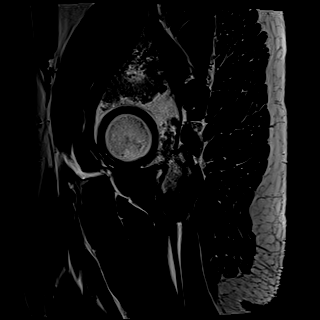

[Series 10: T2 · axial · 4.0mm · 0.74mm/px · z∈[-143,+102]mm · 4 of 50 slices shown (2 of 2)]
[im 1/50]
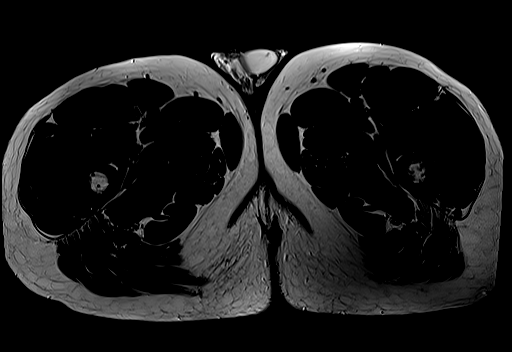
[im 17/50]
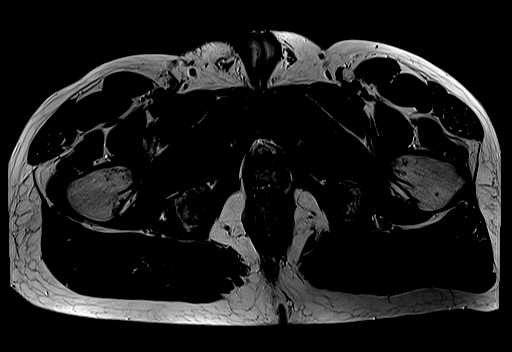
[im 33/50]
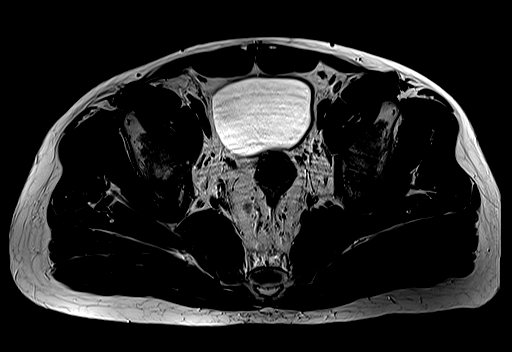
[im 50/50]
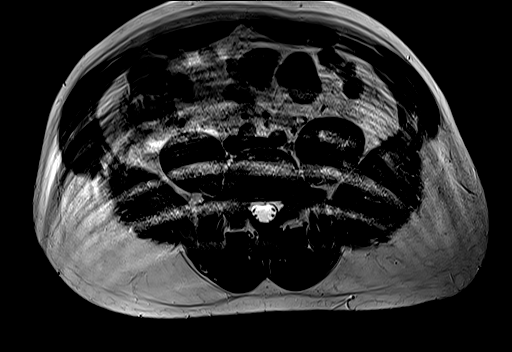

[Series 11: T1 fat-sat post-contrast · axial · 4.0mm · 0.99mm/px · z∈[-133,+112]mm · 4 of 50 slices shown (2 of 2)]
[im 1/50]
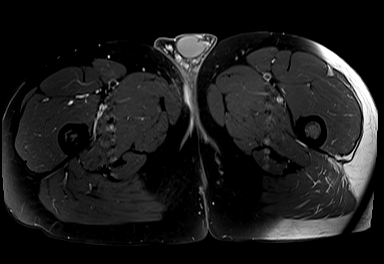
[im 17/50]
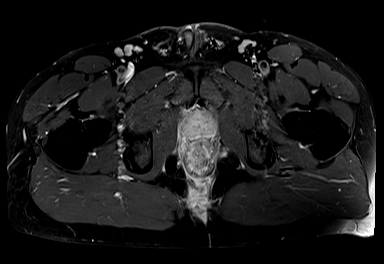
[im 33/50]
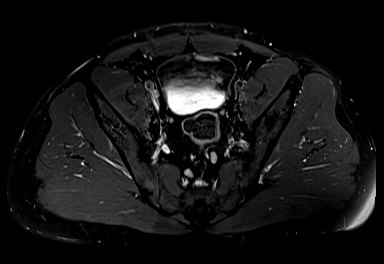
[im 50/50]
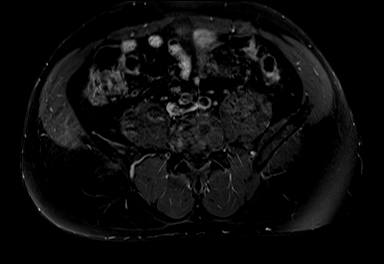

[36 of 48 positions shown; findings below may reference images not displayed]

FINDINGS: Urinary Tract:  Bladder unremarkable.

Bowel: Diffuse wall thickening noted in the rectum with mural and
perirectal edema. Multiple perirectal lymph nodes are evident
measuring up to 1.1 cm short axis.

The extensive edema inflammation of the lower rectum distorts
anatomy. There is a possible small rim enhancing fistula arising
from the 4 o'clock position (axial 29/series 11) the tracks
inferiorly and posteriorly potentially reaching the skin posterior
to the anus. Abnormal enhancement tracks posteriorly to the lower
sacrum/coccygeal region.

Vascular/Lymphatic: No significant vascular abnormality seen.
Perirectal lymphadenopathy evident.

Reproductive:  No prostatomegaly.

Other: Abnormal presacral edema likely related to
edema/inflammation.

Musculoskeletal: Abnormal signal in the coccyx raises concern for
osteomyelitis.
IMPRESSION: 1. Markedly abnormal long segment wall thickening in the rectum with
extensive perirectal edema/inflammation and perirectal
lymphadenopathy. Possible small fistulous tract associated. Imaging
features could be related to infectious/inflammatory etiology or
neoplasm.
2. Edema/inflammation tracks into the presacral space in there is
some abnormal signal in the coccygeal elements. Osteomyelitis not
excluded.

## 2020-12-20 MED ORDER — LORAZEPAM 2 MG/ML IJ SOLN: 1.0000 mg | Freq: Once | INTRAMUSCULAR | Status: DC | PRN

## 2020-12-20 MED ORDER — LACTATED RINGERS IV BOLUS
1000.0000 mL | Freq: Once | INTRAVENOUS | Status: AC
  Administered 2020-12-20: 1000 mL via INTRAVENOUS

## 2020-12-20 MED ORDER — DOCUSATE SODIUM 100 MG PO CAPS: 100.0000 mg | ORAL_CAPSULE | Freq: Two times a day (BID) | ORAL | 0 refills | Status: DC

## 2020-12-20 MED ORDER — LACTATED RINGERS IV SOLN: INTRAVENOUS | Status: DC

## 2020-12-20 MED ORDER — HYDROMORPHONE HCL 1 MG/ML IJ SOLN
1.0000 mg | Freq: Once | INTRAMUSCULAR | Status: AC
  Administered 2020-12-20: 1 mg via INTRAVENOUS
  Filled 2020-12-20: qty 1

## 2020-12-20 MED ORDER — IOHEXOL 300 MG/ML  SOLN: 100.0000 mL | Freq: Once | INTRAMUSCULAR | Status: DC | PRN

## 2020-12-20 MED ORDER — METRONIDAZOLE 500 MG PO TABS: 500.0000 mg | ORAL_TABLET | Freq: Two times a day (BID) | ORAL | 0 refills | Status: DC

## 2020-12-20 MED ORDER — HYDROCODONE-ACETAMINOPHEN 5-325 MG PO TABS: 1.0000 | ORAL_TABLET | Freq: Four times a day (QID) | ORAL | 0 refills | Status: DC | PRN

## 2020-12-20 MED ORDER — IOHEXOL 300 MG/ML  SOLN
100.0000 mL | Freq: Once | INTRAMUSCULAR | Status: AC | PRN
  Administered 2020-12-20: 100 mL via INTRAVENOUS

## 2020-12-20 MED ORDER — LEVOFLOXACIN IN D5W 750 MG/150ML IV SOLN
750.0000 mg | Freq: Once | INTRAVENOUS | Status: AC
  Administered 2020-12-20: 750 mg via INTRAVENOUS
  Filled 2020-12-20: qty 150

## 2020-12-20 MED ORDER — LEVOFLOXACIN 750 MG PO TABS: 750.0000 mg | ORAL_TABLET | Freq: Every day | ORAL | 0 refills | Status: DC

## 2020-12-20 MED ORDER — METRONIDAZOLE 500 MG/100ML IV SOLN
500.0000 mg | Freq: Once | INTRAVENOUS | Status: AC
  Administered 2020-12-20: 500 mg via INTRAVENOUS
  Filled 2020-12-20: qty 100

## 2020-12-20 MED ORDER — MORPHINE SULFATE (PF) 4 MG/ML IV SOLN
4.0000 mg | INTRAVENOUS | Status: DC | PRN
  Administered 2020-12-20: 4 mg via INTRAVENOUS
  Filled 2020-12-20: qty 1

## 2020-12-20 MED ORDER — GADOBUTROL 1 MMOL/ML IV SOLN
10.0000 mL | Freq: Once | INTRAVENOUS | Status: AC | PRN
  Administered 2020-12-20: 10 mL via INTRAVENOUS

## 2020-12-20 NOTE — ED Notes (Signed)
Patient transported to MRI 

## 2020-12-20 NOTE — ED Triage Notes (Signed)
Emergency Medicine Provider Triage Evaluation Note  Shawn [name] , a 35 y.o. male  was evaluated in triage.  Pt complains of rectal mass/tailbone pain, hx of rectal fullness on prior CT from 2021.  Review of Systems  Positive: Rectal pain, lower back pain Negative: Fevers/drainage from rectum  Physical Exam  BP 128/80 (BP Location: Left Arm)   Pulse (!) 102   Temp 98.5 F (36.9 C) (Oral)   Resp 18   SpO2 100%  Gen:   Awake, no distress    HEENT:  Atraumatic   Resp:  Normal effort   Cardiac:  Normal rate   Abd:   Nondistended, nontender   MSK:   Moves extremities without difficulty   Neuro:  Speech clear    Medical Decision Making  Medically screening exam initiated at 2:23 PM.  Appropriate orders placed.  Shawn [name] was informed that the remainder of the evaluation will be completed by another provider, this initial triage assessment does not replace that evaluation, and the importance of remaining in the ED until their evaluation is complete.  Clinical Impression  Pt in the hall, will need more complete evaluation once in a room.  Given hx of potential mass on prior CT without follow up yet, will order a CT pelvis, labs.   Malvin Johns, MD 12/20/20 1425

## 2020-12-20 NOTE — ED Triage Notes (Signed)
C/o lower back pain x 2 months.  States pain radiating down bilateral legs x 1 week.

## 2020-12-20 NOTE — ED Notes (Signed)
Pt stepped outside. Said he would be back.

## 2020-12-20 NOTE — Discharge Instructions (Signed)
1.  Continue to have inflammation in the rectal area.  You have also now developed increasing pain in the coccyx.  It is very important that you get continued care and evaluation for this condition.  You are to follow-up with a gastroenterologist at [facility] gastroenterology.  Contact information is in your discharge instructions. 2.  You are being prescribed a course of antibiotics.  You may start your antibiotics tomorrow.  Be given a prescription for Vicodin.  You may take 1-2 if needed for significant pain.  Take Colace to make sure you do not get any constipation. 3.  Return to the emergency department if you get fevers, worsening pain, new or changing symptoms

## 2020-12-20 NOTE — ED Notes (Signed)
ED Provider at bedside. 

## 2020-12-20 NOTE — ED Provider Notes (Signed)
[facility] EMERGENCY DEPARTMENT Provider Note   CSN: [id number] Arrival date & time: 12/20/20  1135     History Chief Complaint  Patient presents with  . Back Pain    Shawn [name] is a 35 y.o. male.  HPI Patient is been having problems with pain in his coccyx area for almost 2 months.  He reports has been most painful when he sits.  He has to readjust the way he sits to avoid putting weight directly on it and he believes due to that fact, he has now started to get pain through most of his back almost up to the mid thoracic back.  He reports that he does not really have much back pain when he is up and walking, that does not make it any worse.  Reports the back pain occurs when he is in a seated position.  He is not experiencing any weakness or numbness into his legs.  He denies associated abdominal pain.  He denies any history of trauma or injury.  He was seen for it about 2 months ago and given ibuprofen.  He reports this is gradually gotten worse over time.  He does have history of a CT scan showing some "thickening" of the rectum.  He denies he is having pain with bowel movements.  He reports occasionally he sees some mucousy blood with bowel movement.  Patient reports he is sexually active with females only.  Denies anal foreign bodies insertion history.  Patient is a an occasional social drinker.  He does smoke cigarettes.  No drug use.  No IV drug history.  Patient reports he had STD testing done about a month ago that was negative.    Past Medical History:  Diagnosis Date  . Abscess, perirectal, x2  05/18/2013  . Dilated cardiomyopathy ([place]) 05/18/2013   By catheterization 2014   . Hypertension     Patient Active Problem List   Diagnosis Date Noted  . Lower urinary tract infectious disease 06/04/2020  . Renal lesion 06/04/2020  . 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 Alcohol abuse 11/06/2019  . Tobacco abuse 05/18/2013  . Dilated cardiomyopathy ([place]) 05/18/2013  .  Abscess, perirectal, x2  05/18/2013  . Chest pain at rest 11/20/2012  . Hypertension 11/20/2012    Past Surgical History:  Procedure Laterality Date  . LEFT AND RIGHT HEART CATHETERIZATION WITH CORONARY ANGIOGRAM N/A 11/21/2012   Procedure: LEFT AND RIGHT HEART CATHETERIZATION WITH CORONARY ANGIOGRAM;  Surgeon: Birdie Riddle, MD;  Location: [facility] CATH [facility];  Service: Cardiovascular;  Laterality: N/A;  . MANDIBLE FRACTURE SURGERY  2006       Family History  Problem Relation Age of Onset  . Diabetes Mother   . Hypertension Other     Social History   Tobacco Use  . Smoking status: Current Every Day Smoker    Packs/day: 1.00    Types: Cigarettes  . Smokeless tobacco: Never Used  Substance Use Topics  . Alcohol use: Yes    Comment: occ  . Drug use: No    Home Medications Prior to Admission medications   Medication Sig Start Date End Date Taking? Authorizing Provider  ibuprofen (ADVIL) 800 MG tablet Take 1 tablet (800 mg total) by mouth 2 (two) times daily as needed. Patient taking differently: Take 800 mg by mouth 2 (two) times daily as needed for mild pain. 11/18/20  Yes Charlott Rakes, MD  lisinopril (ZESTRIL) 10 MG tablet Take 1 tablet (10 mg total) by mouth daily. 11/18/20  12/18/20 Yes Charlott Rakes, MD    Allergies    Shrimp [shellfish allergy]  Review of Systems   Review of Systems 10 systems reviewed and negative except as per HPI Physical Exam Updated Vital Signs BP (!) 147/87   Pulse 86   Temp 98.5 F (36.9 C) (Oral)   Resp 16   SpO2 99%   Physical Exam Constitutional:      Comments: Alert and nontoxic with clear mental status.  No respiratory distress  HENT:     Mouth/Throat:     Pharynx: Oropharynx is clear.  Eyes:     Extraocular Movements: Extraocular movements intact.     Conjunctiva/sclera: Conjunctivae normal.  Cardiovascular:     Rate and Rhythm: Normal rate and regular rhythm.  Pulmonary:     Effort: Pulmonary effort is normal.     Breath  sounds: Normal breath sounds.  Abdominal:     General: There is no distension.     Palpations: Abdomen is soft.     Tenderness: There is no abdominal tenderness. There is no guarding.  Genitourinary:    Comments: Rectal area does not show any significant mass or hemorrhoid.  Patient does have a irregular, mass of scar tissue in the perineal section from prior abscess.  This is not currently significantly tender.  Patient does have very tender rectal exam.  The rectal mucosa seems boggy and thickened.  No visible blood on rectal exam.  No stool in the vault.  Patient does have a lot of pain to pressure right over the point of the coccyx in the gluteal crease.  There is no visible abscess or palpable soft tissue abnormality adjacent to this area Musculoskeletal:        General: Normal range of motion.     Right lower leg: No edema.     Left lower leg: No edema.  Skin:    General: Skin is warm and dry.  Neurological:     General: No focal deficit present.     Mental Status: He is oriented to person, place, and time.     Motor: No weakness.     Coordination: Coordination normal.  Psychiatric:        Mood and Affect: Mood normal.     ED Results / Procedures / Treatments   Labs (all labs ordered are listed, but only abnormal results are displayed) Labs Reviewed  COMPREHENSIVE METABOLIC PANEL - Abnormal; Notable for the following components:      Result Value   Potassium 5.2 (*)    CO2 18 (*)    BUN 24 (*)    Creatinine, Ser 1.52 (*)    Calcium 8.7 (*)    Total Protein 8.2 (*)    All other components within normal limits  CBC WITH DIFFERENTIAL/PLATELET - Abnormal; Notable for the following components:   WBC 12.9 (*)    RBC 3.11 (*)    Hemoglobin 10.8 (*)    HCT 32.6 (*)    MCV 104.8 (*)    MCH 34.7 (*)    RDW 19.2 (*)    Neutro Abs 8.5 (*)    All other components within normal limits  URINALYSIS, ROUTINE W REFLEX MICROSCOPIC - Abnormal; Notable for the following components:    Color, Urine STRAW (*)    Specific Gravity, Urine 1.039 (*)    All other components within normal limits  CULTURE, BLOOD (ROUTINE X 2)  CULTURE, BLOOD (ROUTINE X 2)  HIV ANTIBODY (ROUTINE TESTING W REFLEX)  RPR  GC/CHLAMYDIA PROBE AMP ([place]) NOT AT [facility]    EKG None  Radiology CT PELVIS W CONTRAST  Result Date: 12/20/2020 CLINICAL DATA:  Rectal abscess. EXAM: CT PELVIS WITH CONTRAST TECHNIQUE: Multidetector CT imaging of the pelvis was performed using the standard protocol following the bolus administration of intravenous contrast. CONTRAST:  164m OMNIPAQUE IOHEXOL 300 MG/ML  SOLN COMPARISON:  06/04/2020 FINDINGS: Urinary Tract:  No abnormality visualized. Bowel: Again noted is diffuse wall thickening of the rectum with adjacent fat stranding and mildly enlarged regional lymph nodes. There is a small amount of free fluid in the presacral space. There is a questionable anorectal fistula on the left without evidence for an associated abscess. This would be better evaluated by MRI. Vascular/Lymphatic: Enlarged perirectal lymph nodes are again noted. There is no significant vascular abnormality detected on this exam. Reproductive:  No mass or other significant abnormality Other:  There is a fat containing right inguinal hernia. Musculoskeletal: No suspicious bone lesions identified. IMPRESSION: 1. Similar appearance of the rectum which may be secondary to proctitis or underlying carcinoma. Outpatient GI follow-up is recommended for further evaluation of this persistent finding. 2. 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 Electronically Signed   By: CConstance HolsterM.D.   On: 12/20/2020 18:17   ECHOCARDIOGRAM COMPLETE  Result Date: 12/19/2020    ECHOCARDIOGRAM REPORT   Patient Name:   SJAMI OHLINDate of Exam: 12/19/2020 Medical Rec #:  [id number]      Height:       72.5 in Accession #:    [id number]     Weight:       209.8 lb Date of  Birth:  [birth year]      BSA:          2.186 m Patient Age:    89years        BP:           126/87 mmHg Patient Gender: M               HR:           93 bpm. Exam Location:  Inpatient Procedure: 2D Echo, Cardiac Doppler and Color Doppler Indications:    Cardiomyopathy-unspecified  History:        Patient has no prior history of Echocardiogram examinations.                 Risk Factors:Hypertension. Dilated cardiomyopathy. Tobacco use.                 ETOH.  Sonographer:    AClayton LefortRDCS (AE) Referring Phys: [email or web address] 1. Left ventricular ejection fraction, by estimation, is 65 to 70%. The left ventricle has normal function. The left ventricle has no regional wall motion abnormalities. There is moderate left ventricular hypertrophy. Left ventricular diastolic parameters were normal.  2. Right ventricular systolic function is normal. The right ventricular size is normal. Tricuspid regurgitation signal is inadequate for assessing PA pressure.  3. The mitral valve is normal in structure. No evidence of mitral valve regurgitation. No evidence of mitral stenosis.  4. The aortic valve is tricuspid. Aortic valve regurgitation is not visualized. No aortic stenosis is present.  5. The inferior vena cava is normal in size with greater than 50% respiratory variability, suggesting right atrial pressure of 3 mmHg. FINDINGS  Left Ventricle: Left ventricular ejection fraction, by estimation, is 65 to 70%. The left ventricle  has normal function. The left ventricle has no regional wall motion abnormalities. The left ventricular internal cavity size was normal in size. There is  moderate left ventricular hypertrophy. Left ventricular diastolic parameters were normal. Right Ventricle: The right ventricular size is normal. No increase in right ventricular wall thickness. Right ventricular systolic function is normal. Tricuspid regurgitation signal is inadequate for assessing PA pressure. Left Atrium: Left atrial  size was normal in size. Right Atrium: Right atrial size was normal in size. Pericardium: There is no evidence of pericardial effusion. Mitral Valve: The mitral valve is normal in structure. No evidence of mitral valve regurgitation. No evidence of mitral valve stenosis. MV peak gradient, 1.7 mmHg. The mean mitral valve gradient is 1.0 mmHg. Tricuspid Valve: The tricuspid valve is normal in structure. Tricuspid valve regurgitation is trivial. Aortic Valve: The aortic valve is tricuspid. Aortic valve regurgitation is not visualized. No aortic stenosis is present. Aortic valve mean gradient measures 3.0 mmHg. Aortic valve peak gradient measures 5.2 mmHg. Aortic valve area, by VTI measures 3.64 cm. Pulmonic Valve: The pulmonic valve was grossly normal. Pulmonic valve regurgitation is trivial. Aorta: The aortic root and ascending aorta are structurally normal, with no evidence of dilitation. Venous: The inferior vena cava is normal in size with greater than 50% respiratory variability, suggesting right atrial pressure of 3 mmHg. IAS/Shunts: The interatrial septum was not well visualized.  LEFT VENTRICLE PLAX 2D LVIDd:         4.20 cm  Diastology LVIDs:         2.50 cm  LV e' medial:    8.92 cm/s LV PW:         1.50 cm  LV E/e' medial:  6.7 LV IVS:        1.30 cm  LV e' lateral:   11.30 cm/s LVOT diam:     2.20 cm  LV E/e' lateral: 5.3 LV SV:         68 LV SV Index:   31 LVOT Area:     3.80 cm  RIGHT VENTRICLE             IVC RV Basal diam:  3.20 cm     IVC diam: 1.60 cm RV S prime:     12.40 cm/s TAPSE (M-mode): 1.8 cm LEFT ATRIUM             Index       RIGHT ATRIUM           Index LA diam:        2.50 cm 1.14 cm/m  RA Area:     13.40 cm LA Vol (A2C):   49.2 ml 22.51 ml/m RA Volume:   32.00 ml  14.64 ml/m LA Vol (A4C):   50.2 ml 22.97 ml/m LA Biplane Vol: 52.5 ml 24.02 ml/m  AORTIC VALVE AV Area (Vmax):    3.57 cm AV Area (Vmean):   3.35 cm AV Area (VTI):     3.64 cm AV Vmax:           114.00 cm/s AV Vmean:           82.400 cm/s AV VTI:            0.186 m AV Peak Grad:      5.2 mmHg AV Mean Grad:      3.0 mmHg LVOT Vmax:         107.00 cm/s LVOT Vmean:        72.600 cm/s LVOT VTI:  0.178 m LVOT/AV VTI ratio: 0.96  AORTA Ao Root diam: 3.70 cm Ao Asc diam:  3.20 cm MITRAL VALVE MV Area (PHT): 4.39 cm    SHUNTS MV Area VTI:   4.54 cm    Systemic VTI:  0.18 m MV Peak grad:  1.7 mmHg    Systemic Diam: 2.20 cm MV Mean grad:  1.0 mmHg MV Vmax:       0.66 m/s MV Vmean:      40.5 cm/s MV Decel Time: 173 msec MV E velocity: 60.00 cm/s MV A velocity: 53.60 cm/s MV E/A ratio:  1.12 Oswaldo Milian MD Electronically signed by Oswaldo Milian MD Signature Date/Time: 12/19/2020/4:48:13 PM    Final     Procedures Procedures   Medications Ordered in ED Medications  iohexol (OMNIPAQUE) 300 MG/ML solution 100 mL (has no administration in time range)  lactated ringers infusion (has no administration in time range)  LORazepam (ATIVAN) injection 1 mg (has no administration in time range)  iohexol (OMNIPAQUE) 300 MG/ML solution 100 mL (100 mLs Intravenous Contrast Given 12/20/20 1735)  HYDROmorphone (DILAUDID) injection 1 mg (1 mg Intravenous Given 12/20/20 1912)  levofloxacin (LEVAQUIN) IVPB 750 mg (0 mg Intravenous Stopped 12/20/20 2213)  metroNIDAZOLE (FLAGYL) IVPB 500 mg (0 mg Intravenous Stopped 12/20/20 2151)  lactated ringers bolus 1,000 mL (0 mLs Intravenous Stopped 12/20/20 2213)    ED Course  I have reviewed the triage vital signs and the nursing notes.  Pertinent labs & imaging results that were available during my care of the patient were reviewed by me and considered in my medical decision making (see chart for details).    MDM Rules/Calculators/A&P                          Consult: Reviewed with Dr. Ree Shay gastroenterology.  Advises would be  Helpful  to proceed with MRI per radiology recommendations. AT That point, patient would likely be appropriate for discharge and follow-up on  outpatient basis for further diagnostic evaluation.  Patient presents with pain that has been incrementally developing for about 2 months.  He previously had a CT with concerning findings of rectal thickening and stranding.  Today CT shows some pelvic lymphadenopathy in addition to persisting rectal changes.  Also patient has now been developing pain in the coccygeal region.  Objectively on physical exam there is no overlying soft tissue changes or apparent abscess externally.  We will proceed with MRI per radiology recommendations to further clarify abnormal findings.  Clinically, patient is nontoxic and has had ongoing symptoms with indolent progression.  At this time, MRI is pending.  I have chosen to empirically treat with antibiotics for possible proctitis with lymphadenopathy.  Patient had previously had a perineal abscess although this does not appear active at this time.  After MRI, anticipate patient will be stable for discharge with Levaquin Flagyl and pain control.  Recommendation will be for close follow-up with [facility].   Final Clinical Impression(s) / ED Diagnoses Final diagnoses:  Rectal pain  Pelvic lymphadenopathy    Rx / DC Orders ED Discharge Orders    None       Charlesetta Shanks, MD 12/20/20 2258

## 2020-12-21 DIAGNOSIS — D519 Vitamin B12 deficiency anemia, unspecified: Secondary | ICD-10-CM

## 2020-12-21 HISTORY — DX: Vitamin B12 deficiency anemia, unspecified: D51.9

## 2020-12-21 LAB — RPR: RPR Ser Ql: NONREACTIVE

## 2020-12-25 LAB — CULTURE, BLOOD (ROUTINE X 2)
Culture: NO GROWTH
Culture: NO GROWTH
Special Requests: ADEQUATE
Special Requests: ADEQUATE

## 2020-12-30 ENCOUNTER — Inpatient Hospital Stay (HOSPITAL_COMMUNITY)
Admission: EM | Admit: 2020-12-30 | Discharge: 2021-01-03 | DRG: 540 | Disposition: A | Discharge status: Home Health | Payer: 59 | Attending: Internal Medicine | Admitting: Internal Medicine

## 2020-12-30 ENCOUNTER — Other Ambulatory Visit: Payer: Self-pay

## 2020-12-30 ENCOUNTER — Encounter (HOSPITAL_COMMUNITY): Payer: Self-pay | Admitting: Emergency Medicine

## 2020-12-30 DIAGNOSIS — L732 Hidradenitis suppurativa: Secondary | ICD-10-CM | POA: Diagnosis present

## 2020-12-30 DIAGNOSIS — Z20822 Contact with and (suspected) exposure to covid-19: Secondary | ICD-10-CM | POA: Diagnosis present

## 2020-12-30 DIAGNOSIS — Z8249 Family history of ischemic heart disease and other diseases of the circulatory system: Secondary | ICD-10-CM

## 2020-12-30 DIAGNOSIS — K6289 Other specified diseases of anus and rectum: Secondary | ICD-10-CM | POA: Diagnosis not present

## 2020-12-30 DIAGNOSIS — I10 Essential (primary) hypertension: Secondary | ICD-10-CM | POA: Diagnosis present

## 2020-12-30 DIAGNOSIS — F17210 Nicotine dependence, cigarettes, uncomplicated: Secondary | ICD-10-CM | POA: Diagnosis present

## 2020-12-30 DIAGNOSIS — Z87440 Personal history of urinary (tract) infections: Secondary | ICD-10-CM | POA: Diagnosis not present

## 2020-12-30 DIAGNOSIS — T402X5A Adverse effect of other opioids, initial encounter: Secondary | ICD-10-CM | POA: Diagnosis present

## 2020-12-30 DIAGNOSIS — R55 Syncope and collapse: Secondary | ICD-10-CM | POA: Diagnosis not present

## 2020-12-30 DIAGNOSIS — N179 Acute kidney failure, unspecified: Secondary | ICD-10-CM | POA: Diagnosis not present

## 2020-12-30 DIAGNOSIS — D539 Nutritional anemia, unspecified: Secondary | ICD-10-CM | POA: Diagnosis present

## 2020-12-30 DIAGNOSIS — K5903 Drug induced constipation: Secondary | ICD-10-CM | POA: Diagnosis present

## 2020-12-30 DIAGNOSIS — M4628 Osteomyelitis of vertebra, sacral and sacrococcygeal region: Secondary | ICD-10-CM | POA: Diagnosis present

## 2020-12-30 DIAGNOSIS — I420 Dilated cardiomyopathy: Secondary | ICD-10-CM | POA: Diagnosis present

## 2020-12-30 DIAGNOSIS — Z833 Family history of diabetes mellitus: Secondary | ICD-10-CM | POA: Diagnosis not present

## 2020-12-30 DIAGNOSIS — K50113 Crohn's disease of large intestine with fistula: Secondary | ICD-10-CM | POA: Diagnosis present

## 2020-12-30 DIAGNOSIS — K611 Rectal abscess: Secondary | ICD-10-CM | POA: Insufficient documentation

## 2020-12-30 DIAGNOSIS — Z87442 Personal history of urinary calculi: Secondary | ICD-10-CM

## 2020-12-30 DIAGNOSIS — M869 Osteomyelitis, unspecified: Secondary | ICD-10-CM

## 2020-12-30 DIAGNOSIS — K5289 Other specified noninfective gastroenteritis and colitis: Secondary | ICD-10-CM | POA: Diagnosis present

## 2020-12-30 DIAGNOSIS — D638 Anemia in other chronic diseases classified elsewhere: Secondary | ICD-10-CM | POA: Diagnosis present

## 2020-12-30 DIAGNOSIS — N281 Cyst of kidney, acquired: Secondary | ICD-10-CM | POA: Diagnosis present

## 2020-12-30 DIAGNOSIS — R935 Abnormal findings on diagnostic imaging of other abdominal regions, including retroperitoneum: Secondary | ICD-10-CM | POA: Diagnosis not present

## 2020-12-30 DIAGNOSIS — N189 Chronic kidney disease, unspecified: Secondary | ICD-10-CM | POA: Diagnosis not present

## 2020-12-30 DIAGNOSIS — R7982 Elevated C-reactive protein (CRP): Secondary | ICD-10-CM | POA: Diagnosis present

## 2020-12-30 DIAGNOSIS — L988 Other specified disorders of the skin and subcutaneous tissue: Secondary | ICD-10-CM

## 2020-12-30 DIAGNOSIS — D519 Vitamin B12 deficiency anemia, unspecified: Secondary | ICD-10-CM | POA: Diagnosis present

## 2020-12-30 DIAGNOSIS — R933 Abnormal findings on diagnostic imaging of other parts of digestive tract: Secondary | ICD-10-CM

## 2020-12-30 DIAGNOSIS — D649 Anemia, unspecified: Secondary | ICD-10-CM | POA: Diagnosis not present

## 2020-12-30 LAB — CBC WITH DIFFERENTIAL/PLATELET
Abs Immature Granulocytes: 0.08 10*3/uL — ABNORMAL HIGH (ref 0.00–0.07)
Basophils Absolute: 0.1 10*3/uL (ref 0.0–0.1)
Basophils Relative: 1 %
Eosinophils Absolute: 0.5 10*3/uL (ref 0.0–0.5)
Eosinophils Relative: 4 %
HCT: 31.6 % — ABNORMAL LOW (ref 39.0–52.0)
Hemoglobin: 10.6 g/dL — ABNORMAL LOW (ref 13.0–17.0)
Immature Granulocytes: 1 %
Lymphocytes Relative: 17 %
Lymphs Abs: 2.4 10*3/uL (ref 0.7–4.0)
MCH: 35.2 pg — ABNORMAL HIGH (ref 26.0–34.0)
MCHC: 33.5 g/dL (ref 30.0–36.0)
MCV: 105 fL — ABNORMAL HIGH (ref 80.0–100.0)
Monocytes Absolute: 0.6 10*3/uL (ref 0.1–1.0)
Monocytes Relative: 4 %
Neutro Abs: 10.7 10*3/uL — ABNORMAL HIGH (ref 1.7–7.7)
Neutrophils Relative %: 73 %
Platelets: 400 10*3/uL (ref 150–400)
RBC: 3.01 MIL/uL — ABNORMAL LOW (ref 4.22–5.81)
RDW: 18.4 % — ABNORMAL HIGH (ref 11.5–15.5)
WBC: 14.3 10*3/uL — ABNORMAL HIGH (ref 4.0–10.5)
nRBC: 0 % (ref 0.0–0.2)

## 2020-12-30 LAB — LACTIC ACID, PLASMA
Lactic Acid, Venous: 1.1 mmol/L (ref 0.5–1.9)
Lactic Acid, Venous: 1.2 mmol/L (ref 0.5–1.9)

## 2020-12-30 LAB — BASIC METABOLIC PANEL
Anion gap: 9 (ref 5–15)
BUN: 15 mg/dL (ref 6–20)
CO2: 21 mmol/L — ABNORMAL LOW (ref 22–32)
Calcium: 8.9 mg/dL (ref 8.9–10.3)
Chloride: 107 mmol/L (ref 98–111)
Creatinine, Ser: 1.49 mg/dL — ABNORMAL HIGH (ref 0.61–1.24)
GFR, Estimated: >60 mL/min (ref >60)
Glucose, Bld: 100 mg/dL — ABNORMAL HIGH (ref 70–99)
Potassium: 4.6 mmol/L (ref 3.5–5.1)
Sodium: 137 mmol/L (ref 135–145)

## 2020-12-30 LAB — RESP PANEL BY RT-PCR (FLU A&B, COVID) ARPGX2
Influenza A by PCR: NEGATIVE
Influenza B by PCR: NEGATIVE
SARS Coronavirus 2 by RT PCR: NEGATIVE

## 2020-12-30 MED ORDER — METRONIDAZOLE 500 MG/100ML IV SOLN
500.0000 mg | Freq: Three times a day (TID) | INTRAVENOUS | Status: DC
  Administered 2020-12-30 – 2021-01-03 (×12): 500 mg via INTRAVENOUS
  Filled 2020-12-30 (×13): qty 100

## 2020-12-30 MED ORDER — ACETAMINOPHEN 650 MG RE SUPP: 650.0000 mg | Freq: Four times a day (QID) | RECTAL | Status: DC | PRN

## 2020-12-30 MED ORDER — PIPERACILLIN-TAZOBACTAM 3.375 G IVPB 30 MIN
3.3750 g | Freq: Once | INTRAVENOUS | Status: AC
  Administered 2020-12-30: 3.375 g via INTRAVENOUS
  Filled 2020-12-30: qty 50

## 2020-12-30 MED ORDER — VANCOMYCIN HCL 2000 MG/400ML IV SOLN
2000.0000 mg | Freq: Once | INTRAVENOUS | Status: AC
  Administered 2020-12-30: 2000 mg via INTRAVENOUS
  Filled 2020-12-30: qty 400

## 2020-12-30 MED ORDER — OXYCODONE HCL 5 MG PO TABS: 10.0000 mg | ORAL_TABLET | Freq: Four times a day (QID) | ORAL | Status: DC | PRN

## 2020-12-30 MED ORDER — SODIUM CHLORIDE 0.9 % IV BOLUS
1000.0000 mL | Freq: Once | INTRAVENOUS | Status: AC
  Administered 2020-12-30: 1000 mL via INTRAVENOUS

## 2020-12-30 MED ORDER — SENNA 8.6 MG PO TABS
1.0000 | ORAL_TABLET | Freq: Two times a day (BID) | ORAL | Status: DC
  Administered 2020-12-30 – 2021-01-03 (×6): 8.6 mg via ORAL
  Filled 2020-12-30 (×6): qty 1

## 2020-12-30 MED ORDER — ACETAMINOPHEN 325 MG PO TABS: 650.0000 mg | ORAL_TABLET | Freq: Four times a day (QID) | ORAL | Status: DC | PRN

## 2020-12-30 MED ORDER — POLYETHYLENE GLYCOL 3350 17 G PO PACK: 17.0000 g | PACK | Freq: Every day | ORAL | Status: DC | PRN

## 2020-12-30 MED ORDER — ENOXAPARIN SODIUM 40 MG/0.4ML IJ SOSY
40.0000 mg | PREFILLED_SYRINGE | INTRAMUSCULAR | Status: DC
  Administered 2020-12-30: 40 mg via SUBCUTANEOUS
  Filled 2020-12-30: qty 0.4

## 2020-12-30 MED ORDER — SODIUM CHLORIDE 0.9 % IV SOLN
2.0000 g | INTRAVENOUS | Status: DC
  Administered 2020-12-31 – 2021-01-03 (×5): 2 g via INTRAVENOUS
  Filled 2020-12-30 (×2): qty 20
  Filled 2020-12-30 (×2): qty 2
  Filled 2020-12-30 (×3): qty 20

## 2020-12-30 MED ORDER — MORPHINE SULFATE (PF) 4 MG/ML IV SOLN
4.0000 mg | Freq: Once | INTRAVENOUS | Status: AC
  Administered 2020-12-30: 4 mg via INTRAVENOUS
  Filled 2020-12-30: qty 1

## 2020-12-30 MED ORDER — OXYCODONE HCL 5 MG PO TABS
5.0000 mg | ORAL_TABLET | ORAL | Status: DC | PRN
  Administered 2020-12-30 – 2021-01-03 (×11): 5 mg via ORAL
  Filled 2020-12-30 (×11): qty 1

## 2020-12-30 NOTE — H&P (Signed)
Date: 12/30/2020               Patient Name:  Shawn [name] MRN: [id number]  DOB: 1986/07/07 Age / Sex: 35 y.o., male   PCP: Shawn Rakes, MD         Medical Service: Internal Medicine Teaching Service         Attending Physician: Dr. Gilles Chiquito, MD    First Contact: Dr. Sanjuan Dame, MD Pager: [phone]  Second Contact: Dr. Harvie Heck, MD Pager: [phone]       After Hours (After 5p/  First Contact Pager: [phone]  weekends / holidays): Second Contact Pager: [phone]   Chief Complaint: "Pain in my tailbone"  History of Present Illness: Shawn [name] is a 35 year old man with past medical history of hidradenitis suppurativa, perirectal abscesses, hypertension, and prior history of alcohol and tobacco use disorder who presented for evaluation of tailbone pain.  Patient states that since February, he has been experiencing progressively worsening tailbone pain. He states that the pain is constant but significantly worsened by sitting and rising from a seated position. He has attempted to alleviate the pain with prescribed ibuprofen, acetaminophen and Norco without relief. He states that his bowel movements have been regular, however he felt constipated yesterday and endorsed pain with straining to defecate and blood upon wiping. He otherwise denies fevers, chills, night sweats, nausea, vomiting, pain with urination, urinary frequency. He has been evaluated by his primary care physician for his tailbone pain and also [facility] for evaluation. He underwent CT and MRI of pelvis at that time which revealed marked wall thickening in the rectum with extensive perirectal edema/inflammation tracking into presacral space and perirectal lymphadenopathy with possible small fistulous tract - most concerning for infectious etiology including osteomyelitis of coccyx, inflammatory etiology or neoplasm. Patient was discharged from emergency department with seven day course of  levofloxacin and metronidazole for suspected proctitis and scheduled for gastroenterology follow-up on 02/11/21. He reports that following discharge from the emergency department, he completed his course of antibiotics, however his symptoms have persisted.  ED Course: On arrival to the ED, patient was afebrile and hemodynamically stable. Labs with CBC revealing WBC 14.3, Abs Neut 10.7, Hb 10.6, MCV 105.0, RDW 18.2; BMP with creatinine 1.49, BUN 15; lactic acid 1.1 then 1.2; respiratory panel negative. No further imaging. Patient received 37m morphine, 1L bolus of normal saline, vancomycin and zosyn. General surgery and gastroenterology consulted with plan to assess patient tomorrow. IMTS consulted for admission.  Medications: Docusate sodium 1042mtwice daily Hydrocodone-acetaminophen 5-32523mvery six hours as needed Ibuprofen 800m66mice daily as needed Levofloxacin 750mg67mly (completed seven day course) Lisinopril 10mg 21my Metronidazole 500mg t5m daily (completed seven day course)  Allergies: Allergies as of 12/30/2020 - Review Complete 12/30/2020  Allergen Reaction Noted  . Shrimp [shellfish allergy] Shortness Of Breath 05/16/2013   Past Medical History:  Diagnosis Date  . Abscess, perirectal, x2  05/18/2013  . Dilated cardiomyopathy ([facility]) 9/[place]2014   By catheterization 2014   . Hypertension    Family History:  Family History  Problem Relation Age of Onset  . Diabetes Mother   . Hypertension Other    Social History: [place] sexual activity with women only and denies anal foreign body insertion. He currently smokes five to six cigarettes daily which has been ongoing since the age of 16 year44old. He reports history of drinking two 24  oz beers most evenings and a couple of shots of liquor, however his last drink was approximately two weeks ago. He does not use drugs.  Review of Systems: A  complete ROS was negative except as per HPI.  Physical Exam: Blood pressure (!) 127/93, pulse 77, temperature 99.1 F (37.3 C), temperature source Oral, resp. rate 11, SpO2 100 %. Physical Exam Vitals and nursing note reviewed. Exam conducted with a chaperone present.  Constitutional:      General: He is not in acute distress.    Appearance: He is not ill-appearing.     Comments: Supine in hospital bed, lying very still with arms flexed over chest  HENT:     Head: Normocephalic and atraumatic.     Mouth/Throat:     Mouth: Mucous membranes are moist.     Pharynx: Oropharynx is clear.  Cardiovascular:     Rate and Rhythm: Normal rate and regular rhythm.     Pulses: Normal pulses.     Heart sounds: Normal heart sounds.  Pulmonary:     Effort: Pulmonary effort is normal.     Breath sounds: Normal breath sounds.  Abdominal:     General: Abdomen is flat. Bowel sounds are normal.     Palpations: Abdomen is soft.     Comments: Mild periumbilical tenderness with palpation  Genitourinary:    Comments: Rectal examination without appreciable swelling or mass. Mild tenderness with digital insetion. Musculoskeletal:     Right lower leg: No edema.     Left lower leg: No edema.     Comments: Tenderness to palpation of coccygeal/sacral region  Skin:    Capillary Refill: Capillary refill takes less than 2 seconds.     Comments: Inguinal hyperpigmentation with indurated plaques, sinus tracts and scarring of the right groin and perirectal region without drainage or odor.   Neurological:     General: No focal deficit present.     Mental Status: He is alert and oriented to person, place, and time. Mental status is at baseline.  Psychiatric:        Mood and Affect: Mood normal.        Behavior: Behavior normal.        Thought Content: Thought content normal.        Judgment: Judgment normal.   Assessment & Plan by Problem: Active Problems:   Perirectal abscess  Shawn [name] is a  35 year old man with past medical history of hidradenitis suppurativa, perirectal abscesses, hypertension, and prior history of alcohol and tobacco use disorder who presented for evaluation of tailbone pain found to have likely osteomyelitis.  #Coccygeal pain secondary to suspected osteomyelitis, active #Hidradenitis suppurativa, chronic Patient endorses several month history of progressively worsening coccygeal pain. Radiologic workup thus far most concerning for a rectal fistula with marked perirectal edema/inflammation tracking into presacral space and concern for osteomyelitis. Patient has failed outpatient oral antibiotics with persistence and worsening of his symptoms. Patient is afebrile with leukocytosis. We will continue broad spectrum IV antibiotics and appreciate recommendations from general surgery and gastroenterology consultants. -General surgery consulted, appreciate recommendations -Gastroenterology consulted, appreciate recommendations -Continue vancomycin -Start metronidazole 535m every eight hours -Start ceftriaxone 2g every twenty four hours -Discontinue Zosyn -ESR/CRP in morning -CBC with Diff in morning -Blood cultures collected -Acetaminophen 654mevery six hours PRN for mild pain -Oxycodone 1049mvery six hours PRN for severe pain  #Chronic kidney disease Patient's baseline creatinine appears to be 1.3-1.5 with creatinine of 1.49 upon admission. Patient endorses appropriate oral intake  without urinary symptoms prior to admission. Patient underwent CT Renal Stone Study in October 2021 which demonstrated several low-attenuation lesions in bilateral kidneys with recommendation to complete abdominal MRI with and without contrast. It does not appear patient has underwent this follow-up imaging.  -Consider obtaining MRI with and without contrast of abdomen this admission or as an outpatient  #Macrocytic anemia Patient has one year history of anemia although macrocytosis  appears to be worsening (most recently 105.0). Patient's macrocytic anemia may be secondary to his history of alcohol use, however we will obtain labs to evaluate for etiology. -Iron and TIBC in morning -Ferritin in morning -Vitamin B12 in morning -Methylmalonic acid in morning  #Hypertension, chronic Patient's blood pressure well controlled since admission. -Holding home lisinopril for now  #Code status: Full code #VTE ppx: Enoxaparin 82m daily #IVF: None #Diet: Heart healthy #Bowel regimen: Senna 8.626mtwice daily and Miralax 17g daily PRN  Dispo: Admit patient to Inpatient with expected length of stay greater than 2 midnights.  Signed: JoCato MulliganMD 12/30/2020, 9:31 PM  Pager: [phone] 5pm on weekdays and 1pm on weekends: On Call pager: [phone]

## 2020-12-30 NOTE — ED Provider Notes (Signed)
[facility] EMERGENCY DEPARTMENT Provider Note   CSN: [id number] Arrival date & time: 12/30/20  1511     History Chief Complaint  Patient presents with  . Rectal Pain    Shawn [name] is a 35 y.o. male history of perirectal abscess, hypertension here presenting with possible osteomyelitis on MRI.  Patient was seen in the ED about 10 days ago.  He had rectal pain at that time.  He had a CT that showed possible rectal mass versus fistula. 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  Patient was prescribed Levaquin and Flagyl. Patient has persistent rectal pain.  He has low-grade temperature at home.  Denies any purulent drainage from his rectum.  Patient has never seen GI in the past and has never had a colonoscopy.  Patient called GI doctor for appointment and next appointment is sometime in June.  Because of his abnormal result, patient came here for further evaluation  The history is provided by the patient.       Past Medical History:  Diagnosis Date  . Abscess, perirectal, x2  05/18/2013  . Dilated cardiomyopathy ([place]) 05/18/2013   By catheterization 2014   . Hypertension     Patient Active Problem List   Diagnosis Date Noted  . Lower urinary tract infectious disease 06/04/2020  . Renal lesion 06/04/2020  . 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 Alcohol abuse 11/06/2019  . Tobacco abuse 05/18/2013  . Dilated cardiomyopathy ([place]) 05/18/2013  . Abscess, perirectal, x2  05/18/2013  . Chest pain at rest 11/20/2012  . Hypertension 11/20/2012    Past Surgical History:  Procedure Laterality Date  . LEFT AND RIGHT HEART CATHETERIZATION WITH CORONARY ANGIOGRAM N/A 11/21/2012   Procedure: LEFT AND RIGHT HEART CATHETERIZATION WITH CORONARY ANGIOGRAM;  Surgeon: Birdie Riddle, MD;  Location: [facility] CATH LAB;  Service: Cardiovascular;  Laterality: N/A;  . MANDIBLE FRACTURE SURGERY   2006       Family History  Problem Relation Age of Onset  . Diabetes Mother   . Hypertension Other     Social History   Tobacco Use  . Smoking status: Current Every Day Smoker    Packs/day: 1.00    Types: Cigarettes  . Smokeless tobacco: Never Used  Substance Use Topics  . Alcohol use: Yes    Comment: occ  . Drug use: No    Home Medications Prior to Admission medications   Medication Sig Start Date End Date Taking? Authorizing Provider  docusate sodium (COLACE) 100 MG capsule Take 1 capsule (100 mg total) by mouth every 12 (twelve) hours. 12/20/20   Charlesetta Shanks, MD  HYDROcodone-acetaminophen (NORCO/VICODIN) 5-325 MG tablet Take 1-2 tablets by mouth every 6 (six) hours as needed for moderate pain or severe pain. 12/20/20   Charlesetta Shanks, MD  ibuprofen (ADVIL) 800 MG tablet Take 1 tablet (800 mg total) by mouth 2 (two) times daily as needed. Patient taking differently: Take 800 mg by mouth 2 (two) times daily as needed for mild pain. 11/18/20   Charlott Rakes, MD  levofloxacin (LEVAQUIN) 750 MG tablet Take 1 tablet (750 mg total) by mouth daily. X 7 days 12/20/20   Charlesetta Shanks, MD  lisinopril (ZESTRIL) 10 MG tablet Take 1 tablet (10 mg total) by mouth daily. 11/18/20 12/18/20  Charlott Rakes, MD  metroNIDAZOLE (FLAGYL) 500 MG tablet Take 1 tablet (500 mg total) by mouth 2 (two) times daily. One po bid x  7 days 12/20/20   Charlesetta Shanks, MD    Allergies    Shrimp [shellfish allergy]  Review of Systems   Review of Systems  Gastrointestinal: Positive for rectal pain.  All other systems reviewed and are negative.   Physical Exam Updated Vital Signs BP 121/84   Pulse 96   Temp 99.1 F (37.3 C) (Oral)   Resp 18   SpO2 100%   Physical Exam Vitals and nursing note reviewed.  Constitutional:      Comments: Uncomfortable  HENT:     Head: Normocephalic.     Mouth/Throat:     Mouth: Mucous membranes are moist.  Eyes:     Extraocular Movements: Extraocular  movements intact.     Pupils: Pupils are equal, round, and reactive to light.  Cardiovascular:     Rate and Rhythm: Normal rate and regular rhythm.     Pulses: Normal pulses.     Heart sounds: Normal heart sounds.  Pulmonary:     Effort: Pulmonary effort is normal.     Breath sounds: Normal breath sounds.  Abdominal:     General: Abdomen is flat.     Palpations: Abdomen is soft.  Genitourinary:    Comments: Rectal-some diffuse swelling around the rectum.  No obvious perirectal abscess. Musculoskeletal:        General: Normal range of motion.     Cervical back: Normal range of motion and neck supple.  Skin:    General: Skin is warm.     Capillary Refill: Capillary refill takes less than 2 seconds.  Neurological:     General: No focal deficit present.     Mental Status: He is oriented to person, place, and time.  Psychiatric:        Mood and Affect: Mood normal.        Behavior: Behavior normal.     ED Results / Procedures / Treatments   Labs (all labs ordered are listed, but only abnormal results are displayed) Labs Reviewed  CBC WITH DIFFERENTIAL/PLATELET - Abnormal; Notable for the following components:      Result Value   WBC 14.3 (*)    RBC 3.01 (*)    Hemoglobin 10.6 (*)    HCT 31.6 (*)    MCV 105.0 (*)    MCH 35.2 (*)    RDW 18.4 (*)    Neutro Abs 10.7 (*)    Abs Immature Granulocytes 0.08 (*)    All other components within normal limits  BASIC METABOLIC PANEL - Abnormal; Notable for the following components:   CO2 21 (*)    Glucose, Bld 100 (*)    Creatinine, Ser 1.49 (*)    All other components within normal limits  CULTURE, BLOOD (ROUTINE X 2)  CULTURE, BLOOD (ROUTINE X 2)  LACTIC ACID, PLASMA  LACTIC ACID, PLASMA    EKG None  Radiology No results found.  Procedures Procedures   Medications Ordered in ED Medications  sodium chloride 0.9 % bolus 1,000 mL (has no administration in time range)  piperacillin-tazobactam (ZOSYN) IVPB 3.375 g (has  no administration in time range)  vancomycin (VANCOREADY) IVPB 1000 mg/200 mL (has no administration in time range)  morphine 4 MG/ML injection 4 mg (has no administration in time range)    ED Course  I have reviewed the triage vital signs and the nursing notes.  Pertinent labs & imaging results that were available during my care of the patient were reviewed by me and considered in my medical decision  making (see chart for details).    MDM Rules/Calculators/A&P                         Glennie Rodda is a 35 y.o. male here presenting with persistent rectal pain.  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  This explain his persistent inflammation around his rectum. There is no mass on the MRI.  At this point will consult general surgery and give broad-spectrum antibiotics for osteomyelitis   6:13 PM Talked to Dr. Kae Heller from surgery.  She states that likely need IV antibiotics and GI consult.  Surgery will also follow along as well.  Given that patient does not have any obvious perirectal abscess right now, does not need any emergent surgical intervention.  6:29 PM Talk to Dr. Tarri Glenn from GI. She states that she will see patient as consult. Will admit to medicine service.    Final Clinical Impression(s) / ED Diagnoses Final diagnoses:  None    Rx / DC Orders ED Discharge Orders    None       Drenda Freeze, MD 12/30/20 [phone]

## 2020-12-30 NOTE — ED Notes (Signed)
Report attempted. Room needs bed. Will call me back.

## 2020-12-30 NOTE — ED Provider Notes (Signed)
Emergency Medicine Provider Triage Evaluation Note  Hans Rusher , a 35 y.o. male  was evaluated in triage.  Pt complains of worsening tailbone, rectal pain.  Seen in ER 4/30 had CT/MRI of pelvis that shows presacral, perirectal edema, inflammation, lymphadenopathy and possible small fistulous tract. Osteomyelitis not excluded. He was discharged on antibiotics and recommended GI evaluation. He has GI appointment for June 20th. Finished antibiotics Friday/Saturday. Symptoms worse. No fevers.   Review of Systems  Positive: Perianal/rectal pain Negative: fever  Physical Exam  BP 121/84   Pulse 96   Temp 99.1 F (37.3 C) (Oral)   Resp 18   SpO2 100%  Gen:   Awake, no distress   Resp:  Normal effort  MSK:   Moves extremities without difficulty     Medical Decision Making  Medically screening exam initiated at 4:11 PM.  Appropriate orders placed.  Ellsworth Waldschmidt was informed that the remainder of the evaluation will be completed by another provider, this initial triage assessment does not replace that evaluation, and the importance of remaining in the ED until their evaluation is complete.  Will need rectal exam. Screening labs ordered   Arlean Hopping 12/30/20 1613    Lennice Sites, DO 12/30/20 1615

## 2020-12-30 NOTE — ED Triage Notes (Signed)
Pt c/o continued rectal/tailbone pain. Evaluated for the same 4/30, given abx, states he has completed those with no change.

## 2020-12-30 NOTE — ED Notes (Signed)
[place] sandwhich and ginger ale provided

## 2020-12-31 DIAGNOSIS — R935 Abnormal findings on diagnostic imaging of other abdominal regions, including retroperitoneum: Secondary | ICD-10-CM

## 2020-12-31 DIAGNOSIS — N179 Acute kidney failure, unspecified: Secondary | ICD-10-CM

## 2020-12-31 DIAGNOSIS — K611 Rectal abscess: Secondary | ICD-10-CM

## 2020-12-31 DIAGNOSIS — I10 Essential (primary) hypertension: Secondary | ICD-10-CM

## 2020-12-31 DIAGNOSIS — K6289 Other specified diseases of anus and rectum: Secondary | ICD-10-CM

## 2020-12-31 DIAGNOSIS — L732 Hidradenitis suppurativa: Secondary | ICD-10-CM

## 2020-12-31 DIAGNOSIS — D649 Anemia, unspecified: Secondary | ICD-10-CM

## 2020-12-31 DIAGNOSIS — R933 Abnormal findings on diagnostic imaging of other parts of digestive tract: Secondary | ICD-10-CM

## 2020-12-31 DIAGNOSIS — M869 Osteomyelitis, unspecified: Secondary | ICD-10-CM

## 2020-12-31 LAB — VITAMIN B12: Vitamin B-12: 61 pg/mL — ABNORMAL LOW (ref 180–914)

## 2020-12-31 LAB — COMPREHENSIVE METABOLIC PANEL
ALT: 14 U/L (ref 0–44)
AST: 14 U/L — ABNORMAL LOW (ref 15–41)
Albumin: 2.9 g/dL — ABNORMAL LOW (ref 3.5–5.0)
Alkaline Phosphatase: 78 U/L (ref 38–126)
Anion gap: 6 (ref 5–15)
BUN: 14 mg/dL (ref 6–20)
CO2: 20 mmol/L — ABNORMAL LOW (ref 22–32)
Calcium: 8 mg/dL — ABNORMAL LOW (ref 8.9–10.3)
Chloride: 112 mmol/L — ABNORMAL HIGH (ref 98–111)
Creatinine, Ser: 1.36 mg/dL — ABNORMAL HIGH (ref 0.61–1.24)
GFR, Estimated: >60 mL/min (ref >60)
Glucose, Bld: 99 mg/dL (ref 70–99)
Potassium: 3.7 mmol/L (ref 3.5–5.1)
Sodium: 138 mmol/L (ref 135–145)
Total Bilirubin: 0.2 mg/dL — ABNORMAL LOW (ref 0.3–1.2)
Total Protein: 6.6 g/dL (ref 6.5–8.1)

## 2020-12-31 LAB — CBC WITH DIFFERENTIAL/PLATELET
Abs Immature Granulocytes: 0.07 10*3/uL (ref 0.00–0.07)
Basophils Absolute: 0 10*3/uL (ref 0.0–0.1)
Basophils Relative: 0 %
Eosinophils Absolute: 0.6 10*3/uL — ABNORMAL HIGH (ref 0.0–0.5)
Eosinophils Relative: 4 %
HCT: 25.2 % — ABNORMAL LOW (ref 39.0–52.0)
Hemoglobin: 8.5 g/dL — ABNORMAL LOW (ref 13.0–17.0)
Immature Granulocytes: 1 %
Lymphocytes Relative: 26 %
Lymphs Abs: 3.7 10*3/uL (ref 0.7–4.0)
MCH: 35.1 pg — ABNORMAL HIGH (ref 26.0–34.0)
MCHC: 33.7 g/dL (ref 30.0–36.0)
MCV: 104.1 fL — ABNORMAL HIGH (ref 80.0–100.0)
Monocytes Absolute: 0.9 10*3/uL (ref 0.1–1.0)
Monocytes Relative: 7 %
Neutro Abs: 8.9 10*3/uL — ABNORMAL HIGH (ref 1.7–7.7)
Neutrophils Relative %: 62 %
Platelets: 339 10*3/uL (ref 150–400)
RBC: 2.42 MIL/uL — ABNORMAL LOW (ref 4.22–5.81)
RDW: 18.1 % — ABNORMAL HIGH (ref 11.5–15.5)
WBC: 14.2 10*3/uL — ABNORMAL HIGH (ref 4.0–10.5)
nRBC: 0 % (ref 0.0–0.2)

## 2020-12-31 LAB — HEMOGLOBIN A1C
Hgb A1c MFr Bld: 5.6 % (ref 4.8–5.6)
Mean Plasma Glucose: 114.02 mg/dL

## 2020-12-31 LAB — SEDIMENTATION RATE: Sed Rate: 78 mm/hr — ABNORMAL HIGH (ref 0–16)

## 2020-12-31 LAB — IRON AND TIBC
Iron: 15 ug/dL — ABNORMAL LOW (ref 45–182)
Saturation Ratios: 8 % — ABNORMAL LOW (ref 17.9–39.5)
TIBC: 189 ug/dL — ABNORMAL LOW (ref 250–450)
UIBC: 174 ug/dL

## 2020-12-31 LAB — FERRITIN: Ferritin: 105 ng/mL (ref 24–336)

## 2020-12-31 LAB — C-REACTIVE PROTEIN: CRP: 5.4 mg/dL — ABNORMAL HIGH (ref <1.0)

## 2020-12-31 MED ORDER — ONDANSETRON HCL 4 MG PO TABS: 4.0000 mg | ORAL_TABLET | Freq: Three times a day (TID) | ORAL | Status: DC | PRN

## 2020-12-31 MED ORDER — METOCLOPRAMIDE HCL 5 MG/ML IJ SOLN
10.0000 mg | Freq: Once | INTRAMUSCULAR | Status: AC
  Administered 2020-12-31: 10 mg via INTRAVENOUS
  Filled 2020-12-31: qty 2

## 2020-12-31 MED ORDER — VITAMIN B-12 1000 MCG PO TABS
1000.0000 ug | ORAL_TABLET | Freq: Every day | ORAL | Status: DC
  Administered 2020-12-31 – 2021-01-03 (×4): 1000 ug via ORAL
  Filled 2020-12-31 (×4): qty 1

## 2020-12-31 MED ORDER — BISACODYL 5 MG PO TBEC
20.0000 mg | DELAYED_RELEASE_TABLET | Freq: Once | ORAL | Status: AC
  Administered 2020-12-31: 20 mg via ORAL
  Filled 2020-12-31: qty 4

## 2020-12-31 MED ORDER — PEG-KCL-NACL-NASULF-NA ASC-C 100 G PO SOLR
1.0000 | Freq: Once | ORAL | Status: AC
  Administered 2020-12-31: 200 g via ORAL
  Filled 2020-12-31: qty 1

## 2020-12-31 MED ORDER — ENOXAPARIN SODIUM 40 MG/0.4ML IJ SOSY
40.0000 mg | PREFILLED_SYRINGE | INTRAMUSCULAR | Status: DC
  Administered 2021-01-02: 40 mg via SUBCUTANEOUS
  Filled 2020-12-31: qty 0.4

## 2020-12-31 MED ORDER — HYDROMORPHONE HCL 1 MG/ML IJ SOLN
0.5000 mg | INTRAMUSCULAR | Status: DC | PRN
  Administered 2021-01-01: 1 mg via INTRAVENOUS
  Filled 2020-12-31: qty 1

## 2020-12-31 MED ORDER — ONDANSETRON HCL 4 MG/2ML IJ SOLN
4.0000 mg | Freq: Three times a day (TID) | INTRAMUSCULAR | Status: DC | PRN
  Filled 2020-12-31: qty 2

## 2020-12-31 NOTE — H&P (View-Only) (Signed)
[facility] Consult: 10:08 AM 12/31/2020  LOS: 1 day    Referring Provider: Dr Daryll Drown Primary Care Physician:  Charlott Rakes, MD Primary Gastroenterologist:  Althia Forts, has appt with Dr. Henrene Pastor in June   Reason for Consultation:  Rectal pain.     HPI: Shawn [name] is a 35 y.o. male.  PMH dilated cardiomyopathy. Has never followed through for cardiac cath.    06/04/2020 CT, renal stone study.  Prominent soft tissue in the lower rectum with adjacent perirectal stranding.   Enlarged perirectal lymph nodes.    2 months of pain in his coccyx area.  Primarily with sitting and standing but is present when he is his supine.  Recently prescribed hydrocodone which is led to some constipation.  Has not seen blood in his stool.  No melena.  No rectal discharge.  Appetite is good.  Weights stable for the last few months.  No nausea, vomiting.  No previous EGD, colonoscopy.  Referred a few times to GI but, lacking insurance, did not follow through.  Course of doxycycline for right groin abscess/boil (? Hydratanitis Suppurativa in mid March 2022, declined offer for I&D of the abscesses. Seen in the ED on 4/30.  Rectal exam felt boggy, thickened and it was tender.  There is also pain with pressure applied to the coccyx.  No visible or palpable abscess. 12/20/2020 CT pelvis w contrast: Stable abnormal rectum w diffuse wall thickening and adjacent fat stranding, ? proctitis versus underlying carcinoma.  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  Enlarged perirectal lymph nodes.  Fat-containing right inguinal hernia. Discharge from ED with prescriptions for Levaquin, metronidazole for 1 week and as needed hydrocodone.  Hgb 8.5, was 10. 8 2 weeks ago.  MCV 104.  WBCs 14.2, 12.9 2 weeks ago Low iron 15.  Ferritin 105.  Low  TIBC 189, low sats 8%.  B12 low 61. CRP elevated 5.4. LFTs at or below normal.  Occasional alcohol.  Primarily beer, a couple of beers every few weeks.  Occasional shots of alcohol.  Smokes half to 6 cigarettes daily.. 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  Past Medical History:  Diagnosis Date  . Abscess, perirectal, x2  05/18/2013  . Dilated cardiomyopathy ([place]) 05/18/2013   By catheterization 2014   . Hypertension     Past Surgical History:  Procedure Laterality Date  . LEFT AND RIGHT HEART CATHETERIZATION WITH CORONARY ANGIOGRAM N/A 11/21/2012   Procedure: LEFT AND RIGHT HEART CATHETERIZATION WITH CORONARY ANGIOGRAM;  Surgeon: Birdie Riddle, MD;  Location: [facility] CATH LAB;  Service: Cardiovascular;  Laterality: N/A;  . MANDIBLE FRACTURE SURGERY  2006    Prior to Admission medications   Medication Sig Start Date End Date Taking? Authorizing Provider  docusate sodium (COLACE) 100 MG capsule Take 1 capsule (100 mg total) by mouth every 12 (twelve) hours. 12/20/20   Charlesetta Shanks, MD  HYDROcodone-acetaminophen (NORCO/VICODIN) 5-325 MG tablet Take 1-2 tablets by mouth every 6 (six) hours as needed for moderate pain or severe pain. 12/20/20   Charlesetta Shanks, MD  ibuprofen (ADVIL) 800 MG tablet Take 1 tablet (800 mg total) by mouth 2 (two) times daily as needed. Patient taking differently: Take 800 mg by mouth 2 (two) times daily as needed for mild pain. 11/18/20   Charlott Rakes, MD  levofloxacin (LEVAQUIN) 750 MG tablet Take 1 tablet (750 mg total) by mouth daily. X 7 days 12/20/20   Charlesetta Shanks, MD  lisinopril (ZESTRIL) 10 MG tablet Take 1 tablet (10 mg total) by mouth daily. 11/18/20 12/18/20  Charlott Rakes, MD  metroNIDAZOLE (FLAGYL) 500 MG tablet Take 1 tablet (500 mg total) by mouth 2 (two) times daily. One po bid x 7 days 12/20/20   Charlesetta Shanks, MD    Scheduled Meds: . enoxaparin (LOVENOX) injection  40 mg Subcutaneous Q24H  . senna  1 tablet Oral BID  .  vitamin B-12  1,000 mcg Oral Daily   Infusions: . cefTRIAXone (ROCEPHIN)  IV Stopped (12/31/20 0042)  . metronidazole 500 mg (12/31/20 0415)   PRN Meds: acetaminophen **OR** acetaminophen, ondansetron (ZOFRAN) IV **OR** ondansetron, oxyCODONE, polyethylene glycol   Allergies as of 12/30/2020 - Review Complete 12/30/2020  Allergen Reaction Noted  . Shrimp [shellfish allergy] Shortness Of Breath 05/16/2013    Family History  Problem Relation Age of Onset  . Diabetes Mother   . Hypertension Other     Social History   Socioeconomic History  . Marital status: Single    Spouse name: Not on file  . Number of children: Not on file  . Years of education: Not on file  . Highest education level: Not on file  Occupational History  . Not on file  Tobacco Use  . Smoking status: Current Every Day Smoker    Packs/day: 1.00    Types: Cigarettes  . Smokeless tobacco: Never Used  Substance and Sexual Activity  . Alcohol use: Yes    Comment: occ  . Drug use: No  . Sexual activity: Not on file  Other Topics Concern  . Not on file  Social History Narrative  . Not on file   Social Determinants of Health   Financial Resource Strain: Not on file  Food Insecurity: Not on file  Transportation Needs: Not on file  Physical Activity: Not on file  Stress: Not on file  Social Connections: Not on file  Intimate Partner Violence: Not on file    REVIEW OF SYSTEMS: Constitutional: No weakness or fatigue. ENT:  No nose bleeds Pulm: Shortness of breath or cough CV:  No palpitations, no LE edema.  No angina GU:  No hematuria, no frequency.  No dark-colored urine. GI: See HPI Heme: Denies unusual or excessive bleeding or bruising. Transfusions: None. Neuro:  No headaches, no peripheral tingling or numbness Derm:  No itching, no rash or sores.  Endocrine:  No sweats or chills.  No polyuria or dysuria Immunization: Not queried.    PHYSICAL EXAM: Vital signs in last 24 hours: Vitals:    12/30/20 2330 12/31/20 0358  BP: 123/76 118/73  Pulse: 74 70  Resp: 17 17  Temp: 98.3 F (36.8 C) 98.4 F (36.9 C)  SpO2: 100% 97%   Wt Readings from Last 3 Encounters:  11/18/20 95.2 kg  08/13/20 98 kg  06/04/20 95.3 kg    General: Pleasant, looks well.  Comfortable.  Alert Head: Facial asymmetry or swelling.  No signs of head trauma. Eyes: No conjunctival pallor or scleral icterus.  EOMI Ears: No hearing deficit Nose: No congestion or discharge. Mouth: Fair dentition.  Oral mucosa  is moist, pink, clear.  Tongue midline Neck: No JVD, masses, thyromegaly Lungs: Clear bilaterally.  No labored breathing.  No cough Heart: RRR.  No MRG.  S1, S2 present Abdomen: Soft, not tender, not distended.  No HSM, masses, bruits, hernias.   Rectal: Deferred.  Today's DRE showed some diffuse swelling around the rectum but no obvious abscess. Musc/Skeltl: No joint redness, swelling or gross deformity. Extremities: No CCE. Neurologic: Alert.  Oriented x3.  Moves all 4 limbs without weakness or tremor. Skin: No rash, no sores, no suspicious lesions   Psych: Pleasant, cooperative, calm.  Intake/Output from previous day: 05/10 0701 - 05/11 0700 In: 795.7 [P.O.:240; IV Piggyback:555.7] Out: -  Intake/Output this shift: No intake/output data recorded.  LAB RESULTS: Recent Labs    12/30/20 1614 12/31/20 0039  WBC 14.3* 14.2*  HGB 10.6* 8.5*  HCT 31.6* 25.2*  PLT 400 339   BMET Lab Results  Component Value Date   NA 138 12/31/2020   NA 137 12/30/2020   NA 136 12/20/2020   K 3.7 12/31/2020   K 4.6 12/30/2020   K 5.2 (H) 12/20/2020   CL 112 (H) 12/31/2020   CL 107 12/30/2020   CL 108 12/20/2020   CO2 20 (L) 12/31/2020   CO2 21 (L) 12/30/2020   CO2 18 (L) 12/20/2020   GLUCOSE 99 12/31/2020   GLUCOSE 100 (H) 12/30/2020   GLUCOSE 85 12/20/2020   BUN 14 12/31/2020   BUN 15 12/30/2020   BUN 24 (H) 12/20/2020   CREATININE 1.36 (H) 12/31/2020   CREATININE 1.49 (H) 12/30/2020    CREATININE 1.52 (H) 12/20/2020   CALCIUM 8.0 (L) 12/31/2020   CALCIUM 8.9 12/30/2020   CALCIUM 8.7 (L) 12/20/2020   LFT Recent Labs    12/31/20 0039  PROT 6.6  ALBUMIN 2.9*  AST 14*  ALT 14  ALKPHOS 78  BILITOT 0.2*   PT/INR Lab Results  Component Value Date   INR 1.17 10/07/2018   INR 1.11 11/20/2012   INR 1.21 11/20/2012   Lipase     Component Value Date/Time   LIPASE 35 06/04/2020 1148    Drugs of Abuse     Component Value Date/Time   LABOPIA NONE DETECTED 01/10/2014 2044   COCAINSCRNUR NONE DETECTED 01/10/2014 2044   LABBENZ NONE DETECTED 01/10/2014 2044   AMPHETMU NONE DETECTED 01/10/2014 2044   THCU NONE DETECTED 01/10/2014 2044   LABBARB NONE DETECTED 01/10/2014 2044      IMPRESSION:   *   Pain in the coccyx, rectal tenderness on exam.  CT scans going back to October 2021 reveal rectal wall thickening and adjacent fat stranding with enlarged perirectal lymph nodes.  R/o neoplasia, rule out IBD.  *   Anemia.  Low iron and B12.     PLAN:     *    Colonoscopy set for 1 PM tomorrow.  Discussed risks, benefits and need for bowel prep and clear liquids with patient.  He is agreeable to proceed.   Azucena Freed  12/31/2020, 10:08 AM Phone [phone]     Attending Physician Note   I have taken a history, examined the patient and reviewed the chart. I agree with the Advanced Practitioner's note, impression and recommendations.  * Ongoing coccyx area pain and rectal tenderness on exam. CTs since October 2021 show similar findings as most recent imaging which is a pelvic MRI on Dec 21, 2020 showing a markedly abnormal long segment wall thickening in the rectum with extensive  perirectal edema/inflammation and perirectal lymphadenopathy. Possible small fistulous tract associated. Imaging features could be related to infectious/inflammatory etiology or neoplasm. Edema/inflammation tracks into the presacral space in there is some abnormal signal in the coccygeal  elements. Osteomyelitis not excluded.  * Hydradenitis suppurativa   * Anemia with a low B12 and low iron studies  Agree with IV antibiotics  Agree with surgical consult Consider ID consult Colonoscopy tomorrow to further evaluate  Anemia evaluation, treatment per primary service   Lucio Edward, MD [facility] [phone]

## 2020-12-31 NOTE — Progress Notes (Signed)
Subjective:  HD1  Patient evaluated at bedside this AM. Shawn [name] "alright" this morning. He continues to have severe pain. He feels bloated and says he has gas. He says he tried to have a BM although was unable to do so. He has been eating well. He denies any CP, SOB, troubles with urinating or any other symptoms. He has not yet heard from GI or surgery.   Objective:  Vital signs in last 24 hours: Vitals:   12/30/20 1800 12/30/20 1915 12/30/20 2330 12/31/20 0358  BP: 130/85 (!) 127/93 123/76 118/73  Pulse: 93 77 74 70  Resp: 16 11 17 17   Temp:   98.3 F (36.8 C) 98.4 F (36.9 C)  TempSrc:    Oral  SpO2: 100% 100% 100% 97%   Physical Exam: General: Laying in bed, no acute distress CV: Regular rate, rhythm. No m/r/g appreciated Pulm: Normal work of breathing on room air Abdomen: Soft, mildly distended, no tenderness MSK: No pitting edema bilaterally. Skin: No swelling or open wounds on sacral region.  CBC Latest Ref Rng & Units 12/31/2020 12/30/2020 12/20/2020  WBC 4.0 - 10.5 K/uL 14.2(H) 14.3(H) 12.9(H)  Hemoglobin 13.0 - 17.0 g/dL 8.5(L) 10.6(L) 10.8(L)  Hematocrit 39.0 - 52.0 % 25.2(L) 31.6(L) 32.6(L)  Platelets 150 - 400 K/uL 339 400 346   BMP Latest Ref Rng & Units 12/31/2020 12/30/2020 12/20/2020  Glucose 70 - 99 mg/dL 99 100(H) 85  BUN 6 - 20 mg/dL 14 15 24(H)  Creatinine 0.61 - 1.24 mg/dL 1.36(H) 1.49(H) 1.52(H)  BUN/Creat Ratio 9 - 20 - - -  Sodium 135 - 145 mmol/L 138 137 136  Potassium 3.5 - 5.1 mmol/L 3.7 4.6 5.2(H)  Chloride 98 - 111 mmol/L 112(H) 107 108  CO2 22 - 32 mmol/L 20(L) 21(L) 18(L)  Calcium 8.9 - 10.3 mg/dL 8.0(L) 8.9 8.7(L)   Assessment/Plan: Shawn [name] is 35yo person living with hidradenitis suppurativa, hypertension, previous perirectal abscesses admitted 5/10 with suspected osteomyelitis with possible fistula.  Active Problems:   Perirectal abscess  #Coccygeal pain 2/2 suspected osteomyelitis #Hidradenitis  suppurativa Patient continues to be afebrile and stable since admission. Continues to have leukocytosis on labs. Prior imaging concerning for rectal fistula and concern for osteomyelitis. ESR/CRP markedly elevated on admission. This morning he was still experiencing pain, will add breakthrough IV medication. Currently on broad-spectrum IV antibiotics, will continue for now. GI and surgery have been consulted, appreciate recommendations. Per chart review, will plan for colonoscopy to rule out IBD, neoplasm. - Continue vancomycin, Rocephin, Flagyl - Colonoscopy tomorrow per GI - NPO @ MN - Oxycodone 37m q4h PRN - IV dilaudid 0.5-179mq4h PRN - Daily CBC - F/u BCx  #Acute kidney injury Baseline GFR >60. sCr 1.5 on arrival, baseline 1.2-1.3. Improvement of creatinine this morning following 1L IVF yesterday. Will continue to monitor, especially with bowel prep for colonoscopy tomorrow. Of note, previous imaging revealed low-attenuation lesions in kidneys, will need MRI for further evaluation, likely in outpatient setting. - Daily BMP - MRI abdomen as outpatient  #Macrocytic anemia Hgb 8.5<10.6 from yesterday. Anemia work-up revealed anemia of chronic disease and vitamin B12 deficiency. Will start supplementation. Patient will have colonoscopy tomorrow as well for further evaluation. - Vitamin B12 daily supplementation - Daily CBC  #Hypertension BP stable, will continue holding home antihypertensive.   DIET: FULL IVF: n/a DVT PPX: Lovenox BOWEL: Senna, Miralax CODE: FULL FAM COM: n/a  Prior to Admission Living Arrangement: Home Anticipated Discharge Location: pending  PT/OT Barriers to Discharge: medical management Dispo: Anticipated discharge in approximately >2 day(s).   Sanjuan Dame, MD 12/31/2020, 6:22 AM Pager: [phone] After 5pm on weekdays and 1pm on weekends: On Call pager [phone]

## 2020-12-31 NOTE — Plan of Care (Signed)

## 2020-12-31 NOTE — Plan of Care (Signed)
Received patient from the ED. Alert, oriented, no distress noted. Patient settled into room, admission assessment completed. Will continue to monitor according to orders and care plan.

## 2020-12-31 NOTE — Consult Note (Addendum)
[facility] Consult: 10:08 AM 12/31/2020  LOS: 1 day    Referring Provider: Dr Daryll Drown Primary Care Physician:  Charlott Rakes, MD Primary Gastroenterologist:  Althia Forts, has appt with Dr. Henrene Pastor in June   Reason for Consultation:  Rectal pain.     HPI: Shawn [name] is a 35 y.o. male.  PMH dilated cardiomyopathy. Has never followed through for cardiac cath.    06/04/2020 CT, renal stone study.  Prominent soft tissue in the lower rectum with adjacent perirectal stranding.   Enlarged perirectal lymph nodes.    2 months of pain in his coccyx area.  Primarily with sitting and standing but is present when he is his supine.  Recently prescribed hydrocodone which is led to some constipation.  Has not seen blood in his stool.  No melena.  No rectal discharge.  Appetite is good.  Weights stable for the last few months.  No nausea, vomiting.  No previous EGD, colonoscopy.  Referred a few times to GI but, lacking insurance, did not follow through.  Course of doxycycline for right groin abscess/boil (? Hydratanitis Suppurativa in mid March 2022, declined offer for I&D of the abscesses. Seen in the ED on 4/30.  Rectal exam felt boggy, thickened and it was tender.  There is also pain with pressure applied to the coccyx.  No visible or palpable abscess. 12/20/2020 CT pelvis w contrast: Stable abnormal rectum w diffuse wall thickening and adjacent fat stranding, ? proctitis versus underlying carcinoma.  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  Enlarged perirectal lymph nodes.  Fat-containing right inguinal hernia. Discharge from ED with prescriptions for Levaquin, metronidazole for 1 week and as needed hydrocodone.  Hgb 8.5, was 10. 8 2 weeks ago.  MCV 104.  WBCs 14.2, 12.9 2 weeks ago Low iron 15.  Ferritin 105.  Low  TIBC 189, low sats 8%.  B12 low 61. CRP elevated 5.4. LFTs at or below normal.  Occasional alcohol.  Primarily beer, a couple of beers every few weeks.  Occasional shots of alcohol.  Smokes half to 6 cigarettes daily.. 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  Past Medical History:  Diagnosis Date  . Abscess, perirectal, x2  05/18/2013  . Dilated cardiomyopathy ([place]) 05/18/2013   By catheterization 2014   . Hypertension     Past Surgical History:  Procedure Laterality Date  . LEFT AND RIGHT HEART CATHETERIZATION WITH CORONARY ANGIOGRAM N/A 11/21/2012   Procedure: LEFT AND RIGHT HEART CATHETERIZATION WITH CORONARY ANGIOGRAM;  Surgeon: Birdie Riddle, MD;  Location: [place] [facility];  Service: Cardiovascular;  Laterality: N/A;  . MANDIBLE FRACTURE SURGERY  2006    Prior to Admission medications   Medication Sig Start Date End Date Taking? Authorizing Provider  docusate sodium (COLACE) 100 MG capsule Take 1 capsule (100 mg total) by mouth every 12 (twelve) hours. 12/20/20   Charlesetta Shanks, MD  HYDROcodone-acetaminophen (NORCO/VICODIN) 5-325 MG tablet Take 1-2 tablets by mouth every 6 (six) hours as needed for moderate pain or severe pain. 12/20/20   Charlesetta Shanks, MD  ibuprofen (ADVIL) 800 MG tablet Take 1 tablet (800 mg total) by mouth 2 (two) times daily as needed. Patient taking differently: Take 800 mg by mouth 2 (two) times daily as needed for mild pain. 11/18/20   Charlott Rakes, MD  levofloxacin (LEVAQUIN) 750 MG tablet Take 1 tablet (750 mg total) by mouth daily. X 7 days 12/20/20   Charlesetta Shanks, MD  lisinopril (ZESTRIL) 10 MG tablet Take 1 tablet (10 mg total) by mouth daily. 11/18/20 12/18/20  Charlott Rakes, MD  metroNIDAZOLE (FLAGYL) 500 MG tablet Take 1 tablet (500 mg total) by mouth 2 (two) times daily. One po bid x 7 days 12/20/20   Charlesetta Shanks, MD    Scheduled Meds: . enoxaparin (LOVENOX) injection  40 mg Subcutaneous Q24H  . senna  1 tablet Oral BID  .  vitamin B-12  1,000 mcg Oral Daily   Infusions: . cefTRIAXone (ROCEPHIN)  IV Stopped (12/31/20 0042)  . metronidazole 500 mg (12/31/20 0415)   PRN Meds: acetaminophen **OR** acetaminophen, ondansetron (ZOFRAN) IV **OR** ondansetron, oxyCODONE, polyethylene glycol   Allergies as of 12/30/2020 - Review Complete 12/30/2020  Allergen Reaction Noted  . Shrimp [shellfish allergy] Shortness Of Breath 05/16/2013    Family History  Problem Relation Age of Onset  . Diabetes Mother   . Hypertension Other     Social History   Socioeconomic History  . Marital status: Single    Spouse name: Not on file  . Number of children: Not on file  . Years of education: Not on file  . Highest education level: Not on file  Occupational History  . Not on file  Tobacco Use  . Smoking status: Current Every Day Smoker    Packs/day: 1.00    Types: Cigarettes  . Smokeless tobacco: Never Used  Substance and Sexual Activity  . Alcohol use: Yes    Comment: occ  . Drug use: No  . Sexual activity: Not on file  Other Topics Concern  . Not on file  Social History Narrative  . Not on file   Social Determinants of Health   Financial Resource Strain: Not on file  Food Insecurity: Not on file  Transportation Needs: Not on file  Physical Activity: Not on file  Stress: Not on file  Social Connections: Not on file  Intimate Partner Violence: Not on file    REVIEW OF SYSTEMS: Constitutional: No weakness or fatigue. ENT:  No nose bleeds Pulm: Shortness of breath or cough CV:  No palpitations, no LE edema.  No angina GU:  No hematuria, no frequency.  No dark-colored urine. GI: See HPI Heme: Denies unusual or excessive bleeding or bruising. Transfusions: None. Neuro:  No headaches, no peripheral tingling or numbness Derm:  No itching, no rash or sores.  Endocrine:  No sweats or chills.  No polyuria or dysuria Immunization: Not queried.    PHYSICAL EXAM: Vital signs in last 24 hours: Vitals:    12/30/20 2330 12/31/20 0358  BP: 123/76 118/73  Pulse: 74 70  Resp: 17 17  Temp: 98.3 F (36.8 C) 98.4 F (36.9 C)  SpO2: 100% 97%   Wt Readings from Last 3 Encounters:  11/18/20 95.2 kg  08/13/20 98 kg  06/04/20 95.3 kg    General: Pleasant, looks well.  Comfortable.  Alert Head: Facial asymmetry or swelling.  No signs of head trauma. Eyes: No conjunctival pallor or scleral icterus.  EOMI Ears: No hearing deficit Nose: No congestion or discharge. Mouth: Fair dentition.  Oral mucosa  is moist, pink, clear.  Tongue midline Neck: No JVD, masses, thyromegaly Lungs: Clear bilaterally.  No labored breathing.  No cough Heart: RRR.  No MRG.  S1, S2 present Abdomen: Soft, not tender, not distended.  No HSM, masses, bruits, hernias.   Rectal: Deferred.  Today's DRE showed some diffuse swelling around the rectum but no obvious abscess. Musc/Skeltl: No joint redness, swelling or gross deformity. Extremities: No CCE. Neurologic: Alert.  Oriented x3.  Moves all 4 limbs without weakness or tremor. Skin: No rash, no sores, no suspicious lesions   Psych: Pleasant, cooperative, calm.  Intake/Output from previous day: 05/10 0701 - 05/11 0700 In: 795.7 [P.O.:240; IV Piggyback:555.7] Out: -  Intake/Output this shift: No intake/output data recorded.  LAB RESULTS: Recent Labs    12/30/20 1614 12/31/20 0039  WBC 14.3* 14.2*  HGB 10.6* 8.5*  HCT 31.6* 25.2*  PLT 400 339   BMET Lab Results  Component Value Date   NA 138 12/31/2020   NA 137 12/30/2020   NA 136 12/20/2020   K 3.7 12/31/2020   K 4.6 12/30/2020   K 5.2 (H) 12/20/2020   CL 112 (H) 12/31/2020   CL 107 12/30/2020   CL 108 12/20/2020   CO2 20 (L) 12/31/2020   CO2 21 (L) 12/30/2020   CO2 18 (L) 12/20/2020   GLUCOSE 99 12/31/2020   GLUCOSE 100 (H) 12/30/2020   GLUCOSE 85 12/20/2020   BUN 14 12/31/2020   BUN 15 12/30/2020   BUN 24 (H) 12/20/2020   CREATININE 1.36 (H) 12/31/2020   CREATININE 1.49 (H) 12/30/2020    CREATININE 1.52 (H) 12/20/2020   CALCIUM 8.0 (L) 12/31/2020   CALCIUM 8.9 12/30/2020   CALCIUM 8.7 (L) 12/20/2020   LFT Recent Labs    12/31/20 0039  PROT 6.6  ALBUMIN 2.9*  AST 14*  ALT 14  ALKPHOS 78  BILITOT 0.2*   PT/INR Lab Results  Component Value Date   INR 1.17 10/07/2018   INR 1.11 11/20/2012   INR 1.21 11/20/2012   Lipase     Component Value Date/Time   LIPASE 35 06/04/2020 1148    Drugs of Abuse     Component Value Date/Time   LABOPIA NONE DETECTED 01/10/2014 2044   COCAINSCRNUR NONE DETECTED 01/10/2014 2044   LABBENZ NONE DETECTED 01/10/2014 2044   AMPHETMU NONE DETECTED 01/10/2014 2044   THCU NONE DETECTED 01/10/2014 2044   LABBARB NONE DETECTED 01/10/2014 2044      IMPRESSION:   *   Pain in the coccyx, rectal tenderness on exam.  CT scans going back to October 2021 reveal rectal wall thickening and adjacent fat stranding with enlarged perirectal lymph nodes.  R/o neoplasia, rule out IBD.  *   Anemia.  Low iron and B12.     PLAN:     *    Colonoscopy set for 1 PM tomorrow.  Discussed risks, benefits and need for bowel prep and clear liquids with patient.  He is agreeable to proceed.   Azucena Freed  12/31/2020, 10:08 AM Phone [phone]     Attending Physician Note   I have taken a history, examined the patient and reviewed the chart. I agree with the Advanced Practitioner's note, impression and recommendations.  * Ongoing coccyx area pain and rectal tenderness on exam. CTs since October 2021 show similar findings as most recent imaging which is a pelvic MRI on Dec 21, 2020 showing a markedly abnormal long segment wall thickening in the rectum with extensive  perirectal edema/inflammation and perirectal lymphadenopathy. Possible small fistulous tract associated. Imaging features could be related to infectious/inflammatory etiology or neoplasm. Edema/inflammation tracks into the presacral space in there is some abnormal signal in the coccygeal  elements. Osteomyelitis not excluded.  * Hydradenitis suppurativa   * Anemia with a low B12 and low iron studies  Agree with IV antibiotics  Agree with surgical consult Consider ID consult Colonoscopy tomorrow to further evaluate  Anemia evaluation, treatment per primary service   Lucio Edward, MD [facility] [phone]

## 2021-01-01 ENCOUNTER — Encounter (HOSPITAL_COMMUNITY): Payer: Self-pay | Admitting: Internal Medicine

## 2021-01-01 ENCOUNTER — Inpatient Hospital Stay (HOSPITAL_COMMUNITY): Payer: 59 | Admitting: Certified Registered Nurse Anesthetist

## 2021-01-01 ENCOUNTER — Encounter (HOSPITAL_COMMUNITY)
Admission: EM | Disposition: A | Discharge status: Home Health | Payer: Self-pay | Source: Home / Self Care | Attending: Internal Medicine

## 2021-01-01 DIAGNOSIS — R933 Abnormal findings on diagnostic imaging of other parts of digestive tract: Secondary | ICD-10-CM

## 2021-01-01 DIAGNOSIS — K6289 Other specified diseases of anus and rectum: Secondary | ICD-10-CM

## 2021-01-01 HISTORY — PX: BIOPSY: SHX5522

## 2021-01-01 HISTORY — PX: COLONOSCOPY WITH PROPOFOL: SHX5780

## 2021-01-01 LAB — CBC
HCT: 27.7 % — ABNORMAL LOW (ref 39.0–52.0)
Hemoglobin: 9.4 g/dL — ABNORMAL LOW (ref 13.0–17.0)
MCH: 35.2 pg — ABNORMAL HIGH (ref 26.0–34.0)
MCHC: 33.9 g/dL (ref 30.0–36.0)
MCV: 103.7 fL — ABNORMAL HIGH (ref 80.0–100.0)
Platelets: 345 10*3/uL (ref 150–400)
RBC: 2.67 MIL/uL — ABNORMAL LOW (ref 4.22–5.81)
RDW: 18.1 % — ABNORMAL HIGH (ref 11.5–15.5)
WBC: 14.4 10*3/uL — ABNORMAL HIGH (ref 4.0–10.5)
nRBC: 0 % (ref 0.0–0.2)

## 2021-01-01 LAB — BASIC METABOLIC PANEL
Anion gap: 11 (ref 5–15)
BUN: 11 mg/dL (ref 6–20)
CO2: 17 mmol/L — ABNORMAL LOW (ref 22–32)
Calcium: 8.7 mg/dL — ABNORMAL LOW (ref 8.9–10.3)
Chloride: 111 mmol/L (ref 98–111)
Creatinine, Ser: 1.34 mg/dL — ABNORMAL HIGH (ref 0.61–1.24)
GFR, Estimated: >60 mL/min (ref >60)
Glucose, Bld: 111 mg/dL — ABNORMAL HIGH (ref 70–99)
Potassium: 4.2 mmol/L (ref 3.5–5.1)
Sodium: 139 mmol/L (ref 135–145)

## 2021-01-01 SURGERY — COLONOSCOPY WITH PROPOFOL
Anesthesia: Monitor Anesthesia Care

## 2021-01-01 MED ORDER — LACTATED RINGERS IV SOLN: INTRAVENOUS | Status: DC | PRN

## 2021-01-01 MED ORDER — PROPOFOL 500 MG/50ML IV EMUL
INTRAVENOUS | Status: DC | PRN
  Administered 2021-01-01: 150 ug/kg/min via INTRAVENOUS

## 2021-01-01 MED ORDER — PROPOFOL 10 MG/ML IV BOLUS
INTRAVENOUS | Status: DC | PRN
  Administered 2021-01-01: 60 mg via INTRAVENOUS

## 2021-01-01 SURGICAL SUPPLY — 22 items

## 2021-01-01 NOTE — Anesthesia Preprocedure Evaluation (Signed)
Anesthesia Evaluation  Patient identified by MRN, date of birth, ID band Patient awake    Reviewed: Allergy & Precautions, NPO status , Patient's Chart, lab work & pertinent test results  Airway Mallampati: II  TM Distance: >3 FB Neck ROM: Full    Dental no notable dental hx. (+) Teeth Intact   Pulmonary Current Smoker and Patient abstained from smoking.,    Pulmonary exam normal breath sounds clear to auscultation       Cardiovascular hypertension, Pt. on medications Normal cardiovascular exam Rhythm:Regular Rate:Normal  Hx/o dilated CM  Echo 12/19/20 1. Left ventricular ejection fraction, by estimation, is 65 to 70%. The left ventricle has normal function. The left ventricle has no regional wall motion abnormalities. There is moderate left ventricular hypertrophy. Left ventricular diastolic parameters were normal.  2. Right ventricular systolic function is normal. The right ventricular size is normal. Tricuspid regurgitation signal is inadequate for assessing PA pressure.  3. The mitral valve is normal in structure. No evidence of mitral valve regurgitation. No evidence of mitral stenosis.  4. The aortic valve is tricuspid. Aortic valve regurgitation is not visualized. No aortic stenosis is present.  5. The inferior vena cava is normal in size with greater than 50% respiratory variability, suggesting right atrial pressure of 3 mmHg.    Neuro/Psych PSYCHIATRIC DISORDERS    GI/Hepatic Neg liver ROS, Hx/o proctitis Hx/o perirectal abscess x 2  Abnormal rectal exam   Endo/Other  negative endocrine ROS  Renal/GU negative Renal ROS  negative genitourinary   Musculoskeletal Coccyx pain   Abdominal   Peds  Hematology negative hematology ROS (+)   Anesthesia Other Findings   Reproductive/Obstetrics                             Anesthesia Physical Anesthesia Plan  ASA: II  Anesthesia Plan:  MAC   Post-op Pain Management:    Induction: Intravenous  PONV Risk Score and Plan: 0 and Treatment may vary due to age or medical condition and Propofol infusion  Airway Management Planned: Natural Airway and Simple Face Mask  Additional Equipment:   Intra-op Plan:   Post-operative Plan:   Informed Consent: I have reviewed the patients History and Physical, chart, labs and discussed the procedure including the risks, benefits and alternatives for the proposed anesthesia with the patient or authorized representative who has indicated his/her understanding and acceptance.     Dental advisory given  Plan Discussed with: CRNA and Anesthesiologist  Anesthesia Plan Comments:         Anesthesia Quick Evaluation

## 2021-01-01 NOTE — Op Note (Addendum)
[facility] Patient Name: Shawn [name] Procedure Date : 01/01/2021 MRN: [id number] Attending MD: Ladene Artist , MD Date of Birth: Jul 13, 1986 CSN: [id number] Age: 35 Admit Type: Inpatient Procedure:                Colonoscopy Indications:              Abnormal CT of the GI tract (rectum), Abnormal MRI                            of the GI tract (rectum), Rectal pain Providers:                Pricilla Riffle. Fuller Plan, MD, Jeanella Cara, RN,                            Tyna Jaksch Technician Referring MD:             Peri Jefferson. Daryll Drown, MD Medicines:                Monitored Anesthesia Care Complications:            No immediate complications. Estimated blood loss:                            None. Estimated Blood Loss:     Estimated blood loss: none. Procedure:                Pre-Anesthesia Assessment:                           - Prior to the procedure, a History and Physical                            was performed, and patient medications and                            allergies were reviewed. The patient's tolerance of                            previous anesthesia was also reviewed. The risks                            and benefits of the procedure and the sedation                            options and risks were discussed with the patient.                            All questions were answered, and informed consent                            was obtained. Prior Anticoagulants: The patient has                            taken no previous anticoagulant or antiplatelet  agents. ASA Grade Assessment: II - A patient with                            mild systemic disease. After reviewing the risks                            and benefits, the patient was deemed in                            satisfactory condition to undergo the procedure.                           After obtaining informed consent, the colonoscope                            was  passed under direct vision. Throughout the                            procedure, the patient's blood pressure, pulse, and                            oxygen saturations were monitored continuously. The                            CF-HQ190L ([id number] Olympus colonoscope was                            introduced through the anus and advanced to the the                            terminal ileum, with identification of the                            appendiceal orifice and IC valve. The ileocecal                            valve, appendiceal orifice, and rectum were                            photographed. The quality of the bowel preparation                            was good. The colonoscopy was performed without                            difficulty. The patient tolerated the procedure                            well. Scope In: 1:12:37 PM Scope Out: 1:38:24 PM Total Procedure Duration: 0 hours 25 minutes 47 seconds  Findings:      The perianal and digital rectal examinations were normal.      The terminal ileum appeared normal.      An area of significantly congested, indurated, friable mucosa was found  in the distal, mid and proximal rectum. Approximately 1/2       circumfirential. Biopsies were taken with a cold forceps for histology.      The exam was otherwise without abnormality on direct and retroflexion       views. Impression:               - The examined portion of the ileum was normal.                           - Congested, indurated, friable mucosa in the                            rectum. Biopsied.                           - The examination was otherwise normal on direct                            and retroflexion views. Recommendation:           - Repeat colonoscopy after studies are complete for                            surveillance based on pathology results.                           - Patient has a contact number available for                             emergencies. The signs and symptoms of potential                            delayed complications were discussed with the                            patient. Return to normal activities tomorrow.                            Written discharge instructions were provided to the                            patient.                           - Resume previous diet.                           - Continue present medications.                           - Await pathology results. Procedure Code(s):        --- Professional ---                           [phone], Colonoscopy, flexible; with biopsy, single  or multiple Diagnosis Code(s):        --- Professional ---                           K62.89, Other specified diseases of anus and rectum                           R93.3, Abnormal findings on diagnostic imaging of                            other parts of digestive tract CPT copyright 2019 [employer]. All rights reserved. The codes documented in this report are preliminary and upon coder review may  be revised to meet current compliance requirements. Ladene Artist, MD 01/01/2021 1:46:03 PM This report has been signed electronically. Number of Addenda: 0

## 2021-01-01 NOTE — Interval H&P Note (Signed)
History and Physical Interval Note:  01/01/2021 12:35 PM  Shawn [name]  has presented today for surgery, with the diagnosis of Pain in coccyx.  Abnormal rectum on CT imaging going back many months..  The various methods of treatment have been discussed with the patient and family. After consideration of risks, benefits and other options for treatment, the patient has consented to  Procedure(s): COLONOSCOPY WITH PROPOFOL (N/A) as a surgical intervention.  The patient's history has been reviewed, patient examined, no change in status, stable for surgery.  I have reviewed the patient's chart and labs.  Questions were answered to the patient's satisfaction.     Pricilla Riffle. Fuller Plan

## 2021-01-01 NOTE — Progress Notes (Addendum)
Subjective:  Shawn [name]  Shawn [name] states that he feels as if he needs to have a bowel movement but cannot get up. He states that last night he passed out. He says he was trying to get up to go to the bathroom and he got light-headed. He has never had this happen before. He says his stool was not dark or bloody. He did feel nauseas last night. Says his pain medications are helping him and he was able to eat last night and PTA.   Objective:  Vital signs in last 24 hours: Vitals:   12/31/20 0950 12/31/20 1250 12/31/20 2022 01/01/21 0419  BP: 130/72 120/73 133/80 115/71  Pulse: 79 63 71 76  Resp: 18  19 18   Temp: 98.3 F (36.8 C) 98.4 F (36.9 C) 98.6 F (37 C) 98.4 F (36.9 C)  TempSrc: Oral Oral Oral Oral  SpO2: 99% 99% 100% 99%  Weight:    89.3 kg   Physical Exam: General: Laying in bed, no acute distress CV: Regular rate, rhythm. No m/r/g Pulm: Normal work of breathing, clear to auscultation bilaterally Abdomen: Soft, non-distended, non-tender. Normoactive bowel sounds. MSK: No pitting edema bilaterally. Psych: Flat affect. Normal speech.  CBC Latest Ref Rng & Units 01/01/2021 12/31/2020 12/30/2020  WBC 4.0 - 10.5 K/uL 14.4(H) 14.2(H) 14.3(H)  Hemoglobin 13.0 - 17.0 g/dL 9.4(L) 8.5(L) 10.6(L)  Hematocrit 39.0 - 52.0 % 27.7(L) 25.2(L) 31.6(L)  Platelets 150 - 400 K/uL 345 339 400   BMP Latest Ref Rng & Units 01/01/2021 12/31/2020 12/30/2020  Glucose 70 - 99 mg/dL 111(H) 99 100(H)  BUN 6 - 20 mg/dL 11 14 15   Creatinine 0.61 - 1.24 mg/dL 1.34(H) 1.36(H) 1.49(H)  BUN/Creat Ratio 9 - 20 - - -  Sodium 135 - 145 mmol/L 139 138 137  Potassium 3.5 - 5.1 mmol/L 4.2 3.7 4.6  Chloride 98 - 111 mmol/L 111 112(H) 107  CO2 22 - 32 mmol/L 17(L) 20(L) 21(L)  Calcium 8.9 - 10.3 mg/dL 8.7(L) 8.0(L) 8.9   Assessment/Plan: Sherri Levenhagen is 35yo person living with hidradenitis suppurativa, hypertension, previous perirectal abscesses admitted 5/10 with suspected osteomyelitis with possible  fistula.  Active Problems:   Perirectal abscess  #Coccygeal pain 2/2 suspected osteomyelitis #Hidradenitis suppurativa Patient afebrile, continues to be stable. No change in leukocytosis on IV antibiotics. BCx negative thus far. Per GI will plan on colonoscopy this afternoon for further evaluation. Discussed with surgery this AM, will wait for colonoscopy for further surgical evaluation. Without any open wounds on exam will stop vancomycin this afternoon. Pain controlled, will continue to monitor. - Continue Rocephin, Flagyl - D/c vancomycin - Colonoscopy this afternoon with GI - Oxycodone 15m q4h PRN - IV dilaudid 0.5-130mq4h PRN - Zofran q8h PRN - Daily CBC  #Pre-syncopal episode Patient reports episode last night where he felt lightheaded once he stood up to go to the bathroom. Denies any chest pain, dyspnea, melena, hematochezia. Will obtain orthostatics and continue to monitor. States he has been eating fine, unclear etiology.  - F/u orthostatics  #Acute kidney injury, resolved Renal function stable over last 24h, patient states he has had adequate po intake. Will need further outpatient work-up for lesions on previous imaging. - Daily BMP - Outpatient MRI abdomen  #Chronic macrocytic anemia Hgb stable, no melena or blood in stool per patient. Will continue with B12 deficiency, can discuss further iron supplementation upon discharge. - Vitamin B12 daily - Daily CBC  #Hypertension Stable, continue holding home antihypertensive.  DIET: FULL  IVF: n/a DVT PPX: Lovenox BOWEL: Senna, Miralax CODE: FULL FAM COM: n/a  Prior to Admission Living Arrangement: Home Anticipated Discharge Location: pending PT/OT Barriers to Discharge: medical management Dispo: Anticipated discharge in approximately >2 day(s).   Sanjuan Dame, MD 01/01/2021, 7:48 AM Pager: [phone] After 5pm on weekdays and 1pm on weekends: On Call pager [phone]

## 2021-01-01 NOTE — Consult Note (Signed)
[facility]  Total days of antibiotics 3/ceftriaxone plus metronidazole 3         Reason for Consult: perirectal abscess    Referring Physician: mullen  Active Problems:   Perirectal abscess   Abnormal CT scan, colon   Rectal pain    HPI: Shawn [name] is a 35 y.o. male with history of HTN, NICM, hydradinitis, recurrent UTI who was admitted for worsening rectal/coccygeal pain. He states that he had an episode of this in October that was accompanied with bloody stools. This time it has been more pain, less blood in stool. Not significantly worsening with defecation. He is in relationship with women, no hx of anal sex. He notices the pain mostly when sitting or standing. Which has steadily worsened over the last 2 months. He has had a few ED visits due to pain- in late April found to have thickened rectal region/proctitis with concern for left anorectal fistual but no abscess and perirectal LN. He was treated with levofloxacin, metronidazole and pain medication.  Interestingly, he was treated with 3-4 episodes of recurrent uti in the last year. He underwent CSY today for evaluation, confirming mri findings of edema to lower rectum. Inflammation, but not mentioning possible small rim enhancing fistual . Biopsy taken.   Past Medical History:  Diagnosis Date  . Abscess, perirectal, x2  05/18/2013  . 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 Dilated cardiomyopathy ([place]) 05/18/2013   By catheterization 2014   . Hypertension     Allergies:  Allergies  Allergen Reactions  . Shrimp [Shellfish Allergy] Shortness Of Breath    MEDICATIONS: . [START ON 01/02/2021] enoxaparin (LOVENOX) injection  40 mg Subcutaneous Q24H  . senna  1 tablet Oral BID  . vitamin B-12  1,000 mcg Oral Daily    Social History   Tobacco Use  . Smoking status: Current Every Day Smoker    Packs/day: 1.00    Types: Cigarettes  . Smokeless tobacco: Never Used   Substance Use Topics  . Alcohol use: Yes    Comment: occ  . Drug use: No    Family History  Problem Relation Age of Onset  . Diabetes Mother   . Hypertension Other     Review of Systems  Constitutional: Negative for fever, chills, diaphoresis, activity change, appetite change, fatigue and unexpected weight change.  HENT: Negative for congestion, sore throat, rhinorrhea, sneezing, trouble swallowing and sinus pressure.  Eyes: Negative for photophobia and visual disturbance.  Respiratory: Negative for cough, chest tightness, shortness of breath, wheezing and stridor.  Cardiovascular: Negative for chest pain, palpitations and leg swelling.  Gastrointestinal: +rectal pain. Negative for nausea, vomiting, abdominal pain, diarrhea, constipation, blood in stool, abdominal distention and anal bleeding.  Genitourinary: Negative for dysuria, hematuria, flank pain and difficulty urinating.  Musculoskeletal: Negative for myalgias, back pain, joint swelling, arthralgias and gait problem.  Skin: Negative for color change, pallor, rash and wound.  Neurological: Negative for dizziness, tremors, weakness and light-headedness.  Hematological: Negative for adenopathy. Does not bruise/bleed easily.  Psychiatric/Behavioral: Negative for behavioral problems, confusion, sleep disturbance, dysphoric mood, decreased concentration and agitation.      OBJECTIVE: Temp:  [97.9 F (36.6 C)-98.6 F (37 C)] 98.3 F (36.8 C) (05/12 1505) Pulse Rate:  [67-80] 74 (05/12 1505) Resp:  [12-19] 14 (05/12 1423) BP: (115-152)/(56-88) 152/88 (05/12 1505) SpO2:  [99 %-100 %] 100 % (05/12 1505) Weight:  [89.3 kg] 89.3 kg (05/12 1100) Physical  Exam  Constitutional: He is oriented to person, place, and time. He appears well-developed and well-nourished. No distress.  HENT:  Mouth/Throat: Oropharynx is clear and moist. No oropharyngeal exudate.  Cardiovascular: Normal rate, regular rhythm and normal heart sounds. Exam  reveals no gallop and no friction rub.  No murmur heard.  Pulmonary/Chest: Effort normal and breath sounds normal. No respiratory distress. He has no wheezes.  Abdominal: Soft. Bowel sounds are normal. He exhibits no distension. There is no tenderness.  Lymphadenopathy:  He has no cervical adenopathy.  Neurological: He is alert and oriented to person, place, and time.  Skin: Skin is warm and dry. No rash noted. No erythema.  Psychiatric: He has a normal mood and affect. His behavior is normal.     LABS: Results for orders placed or performed during the hospital encounter of 12/30/20 (from the past 48 hour(s))  Lactic acid, plasma     Status: None   Collection Time: 12/30/20  6:42 PM  Result Value Ref Range   Lactic Acid, Venous 1.2 0.5 - 1.9 mmol/L    Comment: Performed at [facility], [place] [address] [place], [place] [address]  Blood culture (routine x 2)     Status: None (Preliminary result)   Collection Time: 12/30/20  6:42 PM   Specimen: BLOOD  Result Value Ref Range   Specimen Description BLOOD RIGHT ANTECUBITAL    Special Requests      BOTTLES DRAWN AEROBIC AND ANAEROBIC Blood Culture adequate volume   Culture      NO GROWTH 2 DAYS Performed at [facility], [place] [address], [place], [place] [address]    Report Status PENDING   Resp Panel by RT-PCR (Flu A&B, Covid) Nasopharyngeal Swab     Status: None   Collection Time: 12/30/20  6:42 PM   Specimen: Nasopharyngeal Swab; Nasopharyngeal(NP) swabs in vial transport medium  Result Value Ref Range   SARS Coronavirus 2 by RT PCR NEGATIVE NEGATIVE    Comment: (NOTE) SARS-CoV-2 target nucleic acids are NOT DETECTED.  The SARS-CoV-2 RNA is generally detectable in upper respiratory specimens during the acute phase of infection. The lowest concentration of SARS-CoV-2 viral copies this assay can detect is 138 copies/mL. A negative result does not preclude SARS-Cov-2 infection and should not be used as the sole basis  for treatment or other patient management decisions. A negative result may occur with  improper specimen collection/handling, submission of specimen other than nasopharyngeal swab, presence of viral mutation(s) within the areas targeted by this assay, and inadequate number of viral copies(<138 copies/mL). A negative result must be combined with clinical observations, patient history, and epidemiological information. The expected result is Negative.  Fact Sheet for Patients:  EntrepreneurPulse.com.au  Fact Sheet for Healthcare Providers:  IncredibleEmployment.be  This test is no t yet approved or cleared by the [place] FDA and  has been authorized for detection and/or diagnosis of SARS-CoV-2 by FDA under an Emergency Use Authorization (EUA). This EUA will remain  in effect (meaning this test can be used) for the duration of the COVID-19 declaration under Section 564(b)(1) of the Act, 21 U.S.C.section 360bbb-3(b)(1), unless the authorization is terminated  or revoked sooner.       Influenza A by PCR NEGATIVE NEGATIVE   Influenza B by PCR NEGATIVE NEGATIVE    Comment: (NOTE) The Xpert Xpress SARS-CoV-2/FLU/RSV plus assay is intended as an aid in the diagnosis of influenza from Nasopharyngeal swab specimens and should not be used as a  sole basis for treatment. Nasal washings and aspirates are unacceptable for Xpert Xpress SARS-CoV-2/FLU/RSV testing.  Fact Sheet for Patients: EntrepreneurPulse.com.au  Fact Sheet for Healthcare Providers: IncredibleEmployment.be  This test is not yet approved or cleared by the [place] FDA and has been authorized for detection and/or diagnosis of SARS-CoV-2 by FDA under an Emergency Use Authorization (EUA). This EUA will remain in effect (meaning this test can be used) for the duration of the COVID-19 declaration under Section 564(b)(1) of the Act, 21  U.S.C. section 360bbb-3(b)(1), unless the authorization is terminated or revoked.  Performed at [facility], [place] [address] [place], [place] [address]   Hemoglobin A1c     Status: None   Collection Time: 12/31/20 12:39 AM  Result Value Ref Range   Hgb A1c MFr Bld 5.6 4.8 - 5.6 %    Comment: (NOTE) Pre diabetes:          5.7%-6.4%  Diabetes:              >6.4%  Glycemic control for   <7.0% adults with diabetes    Mean Plasma Glucose 114.02 mg/dL    Comment: Performed at [facility] [address] [place], [place] [address]  Vitamin B12     Status: Abnormal   Collection Time: 12/31/20 12:39 AM  Result Value Ref Range   Vitamin B-12 61 (L) 180 - 914 pg/mL    Comment: (NOTE) This assay is not validated for testing neonatal or myeloproliferative syndrome specimens for Vitamin B12 levels. Performed at [facility], [place] [address] [place], [place] [address]   Ferritin     Status: None   Collection Time: 12/31/20 12:39 AM  Result Value Ref Range   Ferritin 105 24 - 336 ng/mL    Comment: Performed at [facility], [place] [address] [place], [place] [address]  Iron and TIBC     Status: Abnormal   Collection Time: 12/31/20 12:39 AM  Result Value Ref Range   Iron 15 (L) 45 - 182 ug/dL   TIBC 189 (L) 250 - 450 ug/dL   Saturation Ratios 8 (L) 17.9 - 39.5 %   UIBC 174 ug/dL    Comment: Performed at [facility], [place] [address], [place], [place] [address]  Comprehensive metabolic panel     Status: Abnormal   Collection Time: 12/31/20 12:39 AM  Result Value Ref Range   Sodium 138 135 - 145 mmol/L   Potassium 3.7 3.5 - 5.1 mmol/L   Chloride 112 (H) 98 - 111 mmol/L   CO2 20 (L) 22 - 32 mmol/L   Glucose, Bld 99 70 - 99 mg/dL    Comment: Glucose reference range applies only to samples taken after fasting for at least 8 hours.   BUN 14 6 - 20 mg/dL   Creatinine, Ser 1.36 (H) 0.61 - 1.24 mg/dL   Calcium 8.0 (L) 8.9 - 10.3 mg/dL   Total Protein 6.6  6.5 - 8.1 g/dL   Albumin 2.9 (L) 3.5 - 5.0 g/dL   AST 14 (L) 15 - 41 U/L   ALT 14 0 - 44 U/L   Alkaline Phosphatase 78 38 - 126 U/L   Total Bilirubin 0.2 (L) 0.3 - 1.2 mg/dL   GFR, Estimated >60 >60 mL/min    Comment: (NOTE) Calculated using the CKD-EPI Creatinine Equation (2021)    Anion gap 6 5 - 15    Comment: Performed at [facility], [address] [place], [place] [address]  CBC WITH DIFFERENTIAL     Status: Abnormal   Collection Time: 12/31/20 12:39 AM  Result Value Ref Range   WBC 14.2 (H) 4.0 - 10.5 K/uL   RBC 2.42 (L) 4.22 - 5.81 MIL/uL   Hemoglobin 8.5 (L) 13.0 - 17.0 g/dL   HCT 25.2 (L) 39.0 - 52.0 %   MCV 104.1 (H) 80.0 - 100.0 fL   MCH 35.1 (H) 26.0 - 34.0 pg   MCHC 33.7 30.0 - 36.0 g/dL   RDW 18.1 (H) 11.5 - 15.5 %   Platelets 339 150 - 400 K/uL   nRBC 0.0 0.0 - 0.2 %   Neutrophils Relative % 62 %   Neutro Abs 8.9 (H) 1.7 - 7.7 K/uL   Lymphocytes Relative 26 %   Lymphs Abs 3.7 0.7 - 4.0 K/uL   Monocytes Relative 7 %   Monocytes Absolute 0.9 0.1 - 1.0 K/uL   Eosinophils Relative 4 %   Eosinophils Absolute 0.6 (H) 0.0 - 0.5 K/uL   Basophils Relative 0 %   Basophils Absolute 0.0 0.0 - 0.1 K/uL   Immature Granulocytes 1 %   Abs Immature Granulocytes 0.07 0.00 - 0.07 K/uL    Comment: Performed at [facility], [place] [address], [place], [place] [address]  Sedimentation rate     Status: Abnormal   Collection Time: 12/31/20 12:39 AM  Result Value Ref Range   Sed Rate 78 (H) 0 - 16 mm/hr    Comment: Performed at [facility] [address] [place], [place] [address]  C-reactive protein     Status: Abnormal   Collection Time: 12/31/20 12:39 AM  Result Value Ref Range   CRP 5.4 (H) <1.0 mg/dL    Comment: Performed at [facility] [address], [place], [place] [address]  Culture, blood (Routine X 2) w Reflex to ID Panel     Status: None (Preliminary result)   Collection Time: 12/31/20 12:39 AM   Specimen: BLOOD  Result Value Ref  Range   Specimen Description BLOOD LEFT ANTECUBITAL    Special Requests      BOTTLES DRAWN AEROBIC AND ANAEROBIC Blood Culture adequate volume   Culture      NO GROWTH 1 DAY Performed at [facility], [place] [address] [place], [place] [address]    Report Status PENDING   CBC     Status: Abnormal   Collection Time: 01/01/21  4:40 AM  Result Value Ref Range   WBC 14.4 (H) 4.0 - 10.5 K/uL   RBC 2.67 (L) 4.22 - 5.81 MIL/uL   Hemoglobin 9.4 (L) 13.0 - 17.0 g/dL   HCT 27.7 (L) 39.0 - 52.0 %   MCV 103.7 (H) 80.0 - 100.0 fL   MCH 35.2 (H) 26.0 - 34.0 pg   MCHC 33.9 30.0 - 36.0 g/dL   RDW 18.1 (H) 11.5 - 15.5 %   Platelets 345 150 - 400 K/uL   nRBC 0.0 0.0 - 0.2 %    Comment: Performed at [facility], [place] [address], [place], [place] [address]  Basic metabolic panel     Status: Abnormal   Collection Time: 01/01/21  4:40 AM  Result Value Ref Range   Sodium 139 135 - 145 mmol/L   Potassium 4.2 3.5 - 5.1 mmol/L   Chloride 111 98 - 111 mmol/L   CO2 17 (L) 22 - 32 mmol/L   Glucose, Bld 111 (H)  70 - 99 mg/dL    Comment: Glucose reference range applies only to samples taken after fasting for at least 8 hours.   BUN 11 6 - 20 mg/dL   Creatinine, Ser 1.34 (H) 0.61 - 1.24 mg/dL   Calcium 8.7 (L) 8.9 - 10.3 mg/dL   GFR, Estimated >60 >60 mL/min    Comment: (NOTE) Calculated using the CKD-EPI Creatinine Equation (2021)    Anion gap 11 5 - 15    Comment: Performed at [facility] [address], [place], [place] [address]    MICRO: reviewed IMAGING: No results found.   Assessment/Plan:  35yo M with procticitis/rectal inflammation with possible small fistula concern for IBD +/- perirectal abscess - currently on metronidazole and flagyl - will discuss with GI if they saw evidence of fistula on their exam - can consider changing to amox/clav tomorrow

## 2021-01-01 NOTE — Anesthesia Postprocedure Evaluation (Signed)
Anesthesia Post Note  Patient: Noha Karasik  Procedure(s) Performed: COLONOSCOPY WITH PROPOFOL (N/A ) BIOPSY     Patient location during evaluation: PACU Anesthesia Type: MAC Level of consciousness: awake and alert and oriented Pain management: pain level controlled Vital Signs Assessment: post-procedure vital signs reviewed and stable Respiratory status: spontaneous breathing, nonlabored ventilation and respiratory function stable Cardiovascular status: stable and blood pressure returned to baseline Postop Assessment: no apparent nausea or vomiting Anesthetic complications: no   No complications documented.  Last Vitals:  Vitals:   01/01/21 1100 01/01/21 1344  BP: (!) 147/88 118/66  Pulse: 80 78  Resp: 12 15  Temp: 36.7 C 36.6 C  SpO2: 100% 100%    Last Pain:  Vitals:   01/01/21 1344  TempSrc: Oral  PainSc: 0-No pain                 Talvin Christianson A.

## 2021-01-01 NOTE — Plan of Care (Signed)

## 2021-01-01 NOTE — Transfer of Care (Signed)
Immediate Anesthesia Transfer of Care Note  Patient: Shawn [name]  Procedure(s) Performed: COLONOSCOPY WITH PROPOFOL (N/A ) BIOPSY  Patient Location: PACU  Anesthesia Type:MAC  Level of Consciousness: awake, alert  and oriented  Airway & Oxygen Therapy: Patient Spontanous Breathing  Post-op Assessment: Report given to RN and Post -op Vital signs reviewed and stable  Post vital signs: Reviewed and stable  Last Vitals:  Vitals Value Taken Time  BP 118/66 01/01/21 1344  Temp 36.6 C 01/01/21 1344  Pulse 72 01/01/21 1345  Resp 14 01/01/21 1345  SpO2 100 % 01/01/21 1345  Vitals shown include unvalidated device data.  Last Pain:  Vitals:   01/01/21 1344  TempSrc: Oral  PainSc:          Complications: No complications documented.

## 2021-01-01 NOTE — Plan of Care (Signed)
  Problem: Education: Goal: Knowledge of General Education information will improve Description Including pain rating scale, medication(s)/side effects and non-pharmacologic comfort measures Outcome: Progressing   

## 2021-01-02 ENCOUNTER — Encounter (HOSPITAL_COMMUNITY): Payer: Self-pay | Admitting: Internal Medicine

## 2021-01-02 ENCOUNTER — Inpatient Hospital Stay (HOSPITAL_COMMUNITY): Payer: 59

## 2021-01-02 ENCOUNTER — Inpatient Hospital Stay: Payer: Self-pay

## 2021-01-02 LAB — CBC
HCT: 26.1 % — ABNORMAL LOW (ref 39.0–52.0)
Hemoglobin: 8.9 g/dL — ABNORMAL LOW (ref 13.0–17.0)
MCH: 34.8 pg — ABNORMAL HIGH (ref 26.0–34.0)
MCHC: 34.1 g/dL (ref 30.0–36.0)
MCV: 102 fL — ABNORMAL HIGH (ref 80.0–100.0)
Platelets: 362 10*3/uL (ref 150–400)
RBC: 2.56 MIL/uL — ABNORMAL LOW (ref 4.22–5.81)
RDW: 17.6 % — ABNORMAL HIGH (ref 11.5–15.5)
WBC: 12.5 10*3/uL — ABNORMAL HIGH (ref 4.0–10.5)
nRBC: 0 % (ref 0.0–0.2)

## 2021-01-02 LAB — BASIC METABOLIC PANEL
Anion gap: 8 (ref 5–15)
BUN: 12 mg/dL (ref 6–20)
CO2: 18 mmol/L — ABNORMAL LOW (ref 22–32)
Calcium: 8.2 mg/dL — ABNORMAL LOW (ref 8.9–10.3)
Chloride: 110 mmol/L (ref 98–111)
Creatinine, Ser: 1.35 mg/dL — ABNORMAL HIGH (ref 0.61–1.24)
GFR, Estimated: >60 mL/min (ref >60)
Glucose, Bld: 93 mg/dL (ref 70–99)
Potassium: 3.7 mmol/L (ref 3.5–5.1)
Sodium: 136 mmol/L (ref 135–145)

## 2021-01-02 LAB — SURGICAL PATHOLOGY

## 2021-01-02 LAB — HEPATITIS B SURFACE ANTIGEN: Hepatitis B Surface Ag: NONREACTIVE

## 2021-01-02 IMAGING — MR MR ABDOMEN WO/W CM
15 of 27 series · 17 of 48 positions shown · IV contrast (gadavist)
Comparison: CT of the pelvis from [DATE] and CT of the
abdomen and pelvis from [DATE].

CLINICAL DATA: Renal rectal abscess, fever and drainage from rectum
in a 34-year-old male.

EXAM:
MRI ABDOMEN AND PELVIS WITHOUT AND WITH CONTRAST
TECHNIQUE: Multiplanar multisequence MR imaging of the abdomen and pelvis was
performed both before and after the administration of intravenous
contrast.
CONTRAST:  9mL GADAVIST GADOBUTROL 1 MMOL/ML IV SOLN

[Series 6: T2 · sagittal · 2.5mm · 0.53mm/px · 1 of 70 slices shown (1 of 2)]
[im 1/70]
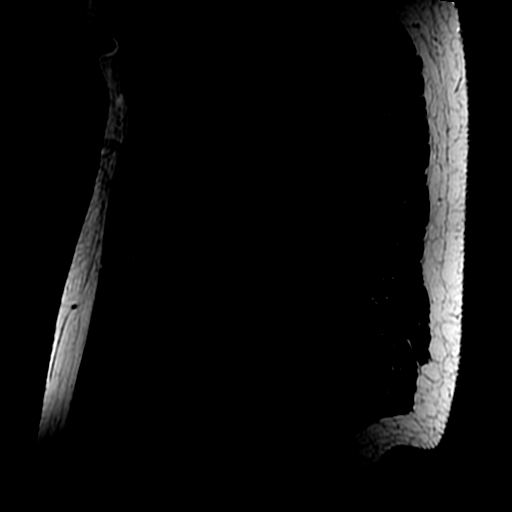

[Series 7: T2 fat-sat · sagittal · 2.5mm · 0.53mm/px · 1 of 70 slices shown (1 of 3)]
[im 1/70]
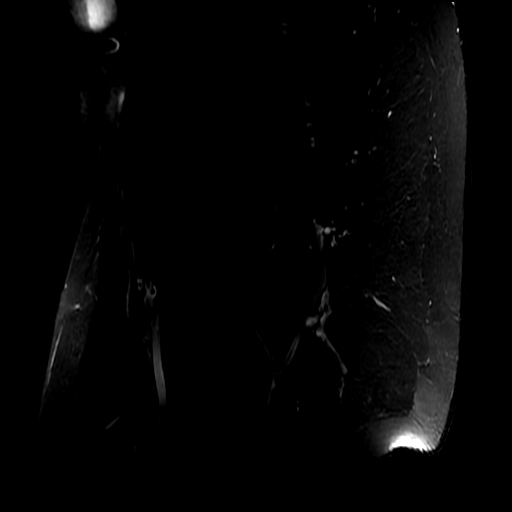

[Series 8: T2 · axial · 4.0mm · 0.47mm/px · 1 of 51 slices shown (2 of 2)]
[im 1/51]
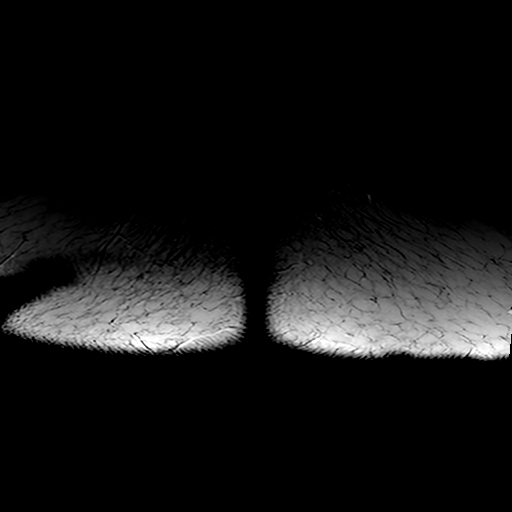

[Series 9: T2 fat-sat · axial · 4.0mm · 0.47mm/px · 1 of 51 slices shown (2 of 3)]
[im 1/51]
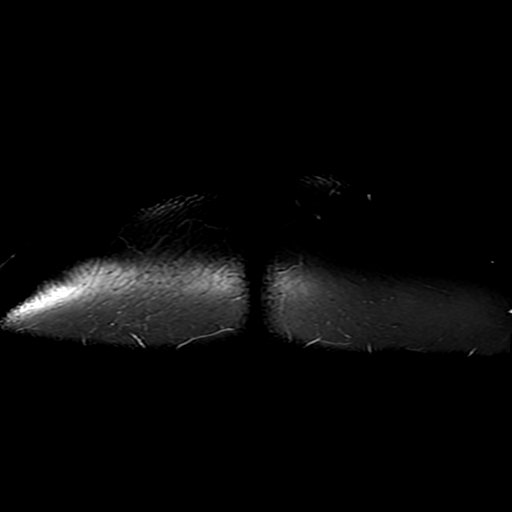

[Series 10: T1 · axial · 4.0mm · 0.47mm/px · 1 of 51 slices shown]
[im 1/51]
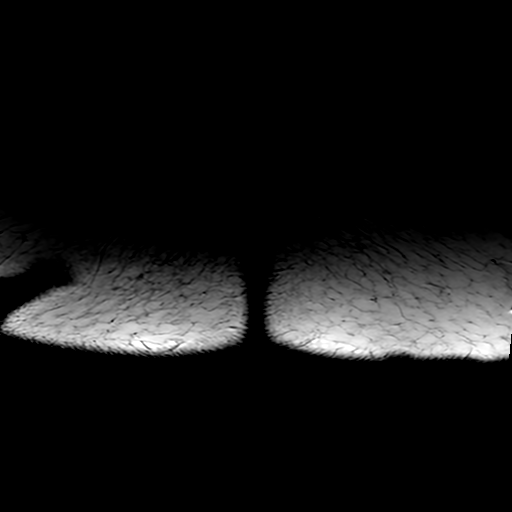

[Series 11: T2 fat-sat · coronal · 4.0mm · 0.47mm/px · 1 of 39 slices shown (3 of 3)]
[im 1/39]
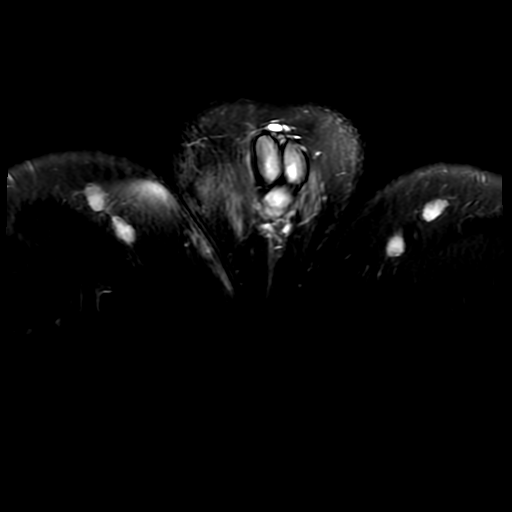

[Series 12: cor ssfse nav · coronal · 6.0mm · 0.78mm/px · 1 of 42 slices shown]
[im 1/42]
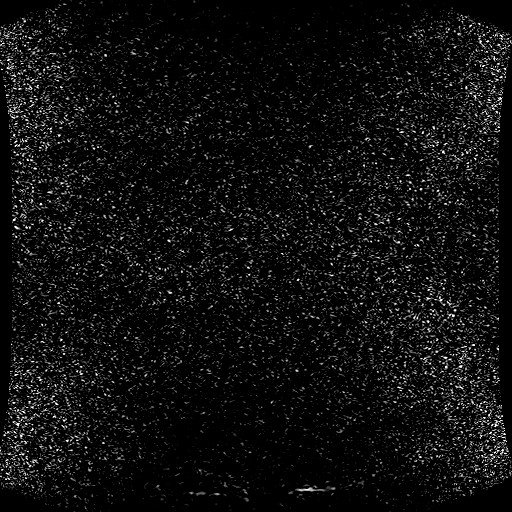

[Series 13: ax ssfse nav · axial · 6.0mm · 0.74mm/px · 1 of 47 slices shown]
[im 1/47]
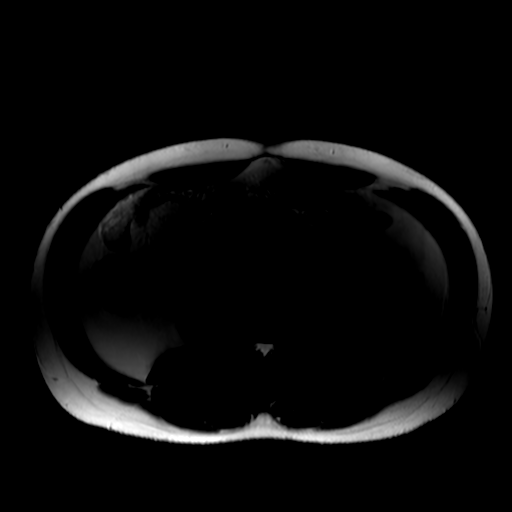

[Series 15: DWI b500 · axial · 8.0mm · 1.76mm/px · 1 of 56 slices shown]
[im 1/56]
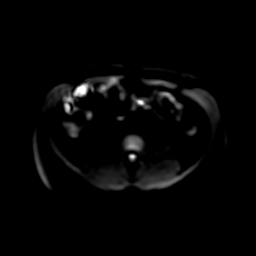

[Series 16: bSSFP · axial · 6.0mm · 0.62mm/px · 1 of 47 slices shown]
[im 1/47]
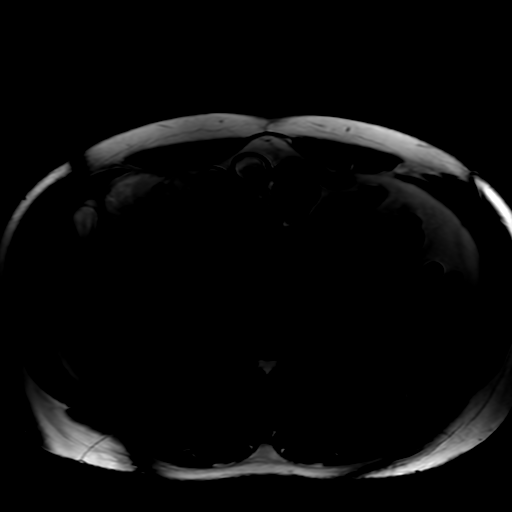

[Series 18: T1 dynamic · coronal · 3.3mm · 1.56mm/px · 2 of 152 slices shown]
[im 1/152]
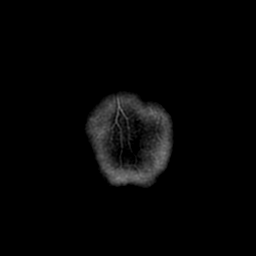
[im 152/152]
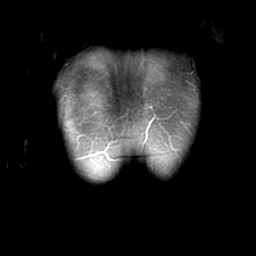

[Series 19: T1 fat-sat · axial · 4.0mm · 0.47mm/px · 1 of 49 slices shown (1 of 2)]
[im 1/49]
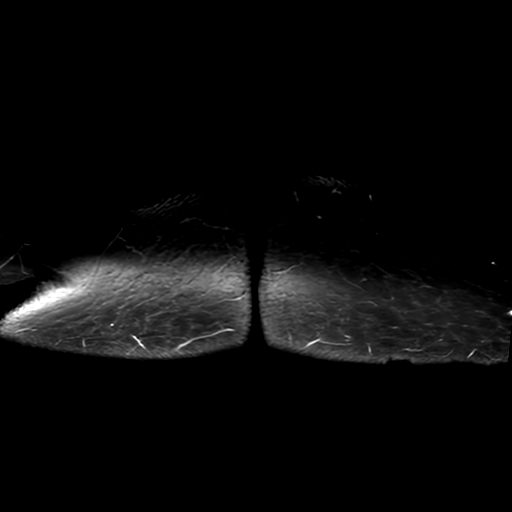

[Series 20: T1 fat-sat · coronal · 4.0mm · 0.47mm/px · 1 of 39 slices shown (2 of 2)]
[im 1/39]
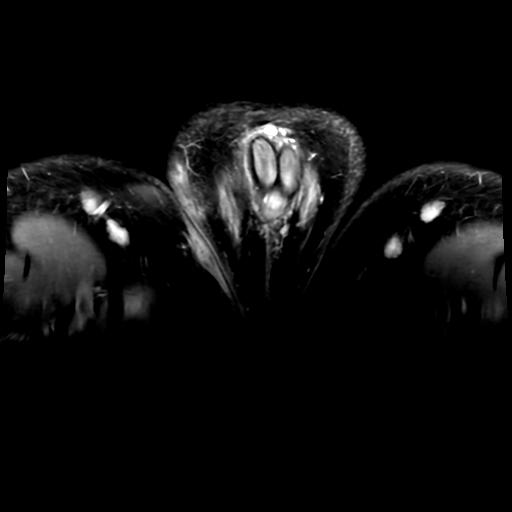

[Series 1550: ADC · axial · 8.0mm · 1.76mm/px · 1 of 28 slices shown]
[im 1/28]
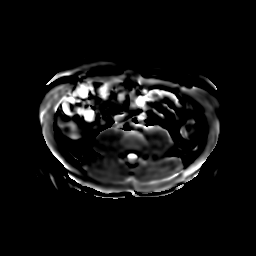

[Series 1700: T1 dynamic post-contrast · axial · non-contrast · 4.0mm · 0.86mm/px · z∈[-103,+43]mm · 2 of 148 slices shown]
[im 1/148]
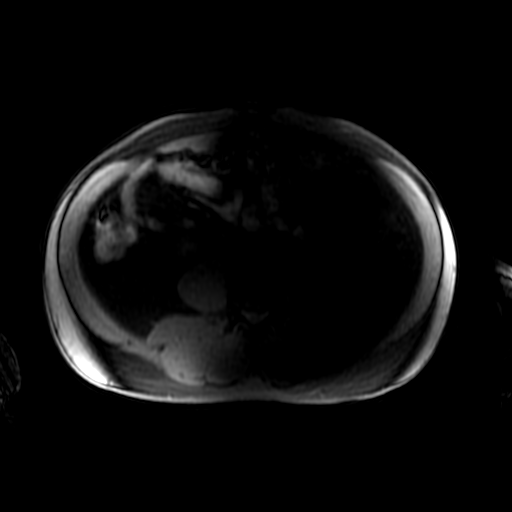
[im 74/148]
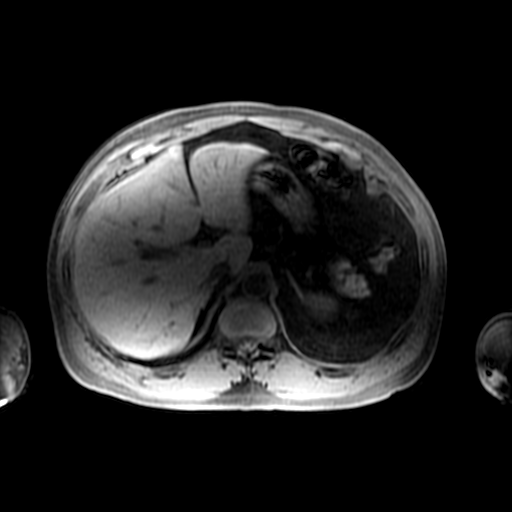

[17 of 48 positions shown; findings below may reference images not displayed]

FINDINGS: COMBINED FINDINGS FOR BOTH MR ABDOMEN AND PELVIS

Lower chest: No effusion. No consolidation. Limited assessment of
the lung bases on MRI.

Hepatobiliary: No focal, suspicious hepatic lesion. Portal vein is
patent. No pericholecystic stranding but with numerous large
gallstones in the gallbladder lumen. The biliary tree is nondilated.

Pancreas: Normal intrinsic T1 signal. No ductal dilation or sign of
inflammation. No focal lesion.

Spleen:  Spleen normal size and contour.

Adrenals/Urinary Tract: Adrenal glands are normal. Kidneys are
normal size but with numerous cysts throughout the kidneys both in
the cortical and corticomedullary portions of the kidneys. No
hydronephrosis. No perinephric stranding. Many of the cysts are tiny
on the order of 1-2 mm, most between 3 mm and 1 cm. Many of these
show marked intrinsic T1 hyperintensity compatible with hemorrhagic
or proteinaceous cysts. Some are too small for definitive
characterization.

Stomach/Bowel: Proximal portion of the bowel without signs of
obstruction or acute process. The appendix not clearly visualized
though no stranding in the area to suggest acute process. Pelvic
portion of the exam is focused on the area of the rectum, per
fistula/rectal protocol of the pelvis therefore bowel in the mid
abdomen shows incomplete assessment.

There is intense rectal thickening and perirectal stranding.

Extensive presacral edema and trace amount of fluid in the presacral
space.

Multiple enlarged lymph nodes in the mesorectum greater than 6. For
instance on image 15 of series 9 there is a 6 mm LEFT mesorectal
lymph node with similar size lymph nodes scattered throughout the
mesorectum. Along the course of the superior rectal vein on image 11
of series 9 there is a 7 mm lymph node. Smaller nodes track along
the superior rectal vein to the lower abdomen. Stranding and
inflammation is seen at the level of the pelvic floor including near
the crus of the LEFT corpora and near the base of the penis at the
pelvic floor. There is a small tract that extends anteriorly from
the sphincter complex, upper margin towards the base of the
penis/penile crura and corpora spongiosum best seen on images 32 and
33 of series 9.

Tiny pocket of fluid in the LEFT anterior ischial rectal fat (image
31/) 9 8 x 6 mm.

Another small tract extending from the LEFT lateral margin of the
sphincter complex towards the LEFT ischial rectal fossa and LEFT
obturator muscle (image 33, 32 and 31 of series 9 at the 3 o'clock
position.

At the upper margin of the sphincter complex at the pelvic floor
penetrating the pubococcygeal muscle potentially is another
suspected tract that may originate from the mid portion of the anal
canal/sphincter complex (image 35 tracking cephalad to a small
collection on image 33 of series 9.)

Intense inflammation tracks along the levator musculature at the
lower pelvic floor towards the RIGHT and LEFT pelvic sidewall and
sacrum and coccyx. Extensive inflammation about the coccyx.
Increased T2 signal within the lower sacrum and coccyx.

Just above the LEFT levator musculature is inflammation extending
from the lower rectum along the upper margin of the sphincter
complex best seen on image 28 of series 20. No drainable fluid or
discrete collection. This may ultimately penetrate the LEFT levator
muscle on image 24 of series 20. This appears to originate from a
transmural process (defect in the posterolateral rectal wall along
the LEFT posterolateral rectum along the LEFT lateral rectum
extending towards the pelvic floor a. Small fistulous tract also
along the RIGHT anterior sphincter complex tracking towards the
penile corpora and bulbous spongiosum, edema tracking along the
RIGHT lateral rectum just above the pelvic floor on the sagittal
view, image 30 of series 7.

Intense inflammation also tracking through the intersphincteric
plane about the anus. Inflammation also of the prostate is suspected
without definitive extension into the gland proper.

Inflammation tracks into the RIGHT mid thigh in anteriorly towards
the inguinal canal with fluid containing tract in this region as
well tracking potentially from inflammation extending from the
gluteal cleft. This is better seen on previous imaging there is
stranding in the bilateral gluteal cleft as well posteriorly better
seen on full field of view images obtained on a prior exam.

Marked rectal thickening is noted mainly below the level of the
anterior peritoneal reflection.

Vascular/Lymphatic: Vascular structures in the pelvis are not well
assessed. Lymph nodes about the mesorectum and just outside the
mesorectum along the RIGHT pelvic sidewall with 9 mm lymph node
adjacent to the obturator muscle on the RIGHT (image [DATE]). Smaller
lymph nodes along the RIGHT and LEFT iliac change.

There is no gastrohepatic or hepatoduodenal ligament
lymphadenopathy. No retroperitoneal or mesenteric lymphadenopathy.

Reproductive: Suspected prostate inflammation/prostatitis,
potentially secondary. Urinary bladder with smooth contours.

Other:  None

Musculoskeletal: Edema of sacrum and coccyx. Fluid about the
sacrococcygeal region as well measuring approximately 12 x 11 mm.
(Image [DATE] of the pelvic portion of the exam. Otherwise no acute
musculoskeletal process.
IMPRESSION: 1. Intense rectal thickening and signs of perirectal enhancement and
edema with complex fistulous network both above and below the pelvic
floor. Rectal thickening extends above the APR but is predominantly
below the APR. Findings are associated with small pockets of fluid,
none appearing drainable. Differential considerations would include
inflammatory bowel disease and neoplasm.
2. Transmural process of both the rectum and likely in the anal
canal.
3. Inflammation extends to the penile crura and the base of the
corpora spongiosum.
4. Inflammation also suspected in the prostate, without definitive
extension into the gland proper.
5. Enhancing tract extending into the soft tissues of the RIGHT
medial thigh towards the RIGHT inguinal region potentially from the
gluteal cleft. Seen better on previous study.
6. Multiple enlarged lymph nodes in the pelvis and along the RIGHT
pelvic sidewall may be reactive or neoplastic.
7. Numerous large gallstones in the gallbladder lumen without
evidence of cholecystitis or biliary ductal dilation.
8. Numerous renal cysts some with hemorrhagic or proteinaceous
features and some too small for characterization definitively.
Compatible with renal cystic disease. Would consider medullary
cystic disease and early autosomal dominant polycystic kidney
disease. Ultimately if not yet obtained nephrology consultation may
be warranted.
9. Edema of the sacrum and coccyx suspicious for osteomyelitis with
small fluid collection in this area and enhancement.

## 2021-01-02 MED ORDER — OXYCODONE HCL 5 MG PO TABS: 5.0000 mg | ORAL_TABLET | ORAL | 0 refills | Status: AC | PRN

## 2021-01-02 MED ORDER — CYANOCOBALAMIN 1000 MCG PO TABS: 1000.0000 ug | ORAL_TABLET | Freq: Every day | ORAL | 0 refills | Status: DC

## 2021-01-02 MED ORDER — GADOBUTROL 1 MMOL/ML IV SOLN
9.0000 mL | Freq: Once | INTRAVENOUS | Status: AC | PRN
  Administered 2021-01-02: 9 mL via INTRAVENOUS

## 2021-01-02 MED ORDER — CEFTRIAXONE IV (FOR PTA / DISCHARGE USE ONLY): 2.0000 g | INTRAVENOUS | 0 refills | Status: DC

## 2021-01-02 MED ORDER — METRONIDAZOLE 500 MG PO TABS: 500.0000 mg | ORAL_TABLET | Freq: Three times a day (TID) | ORAL | 0 refills | Status: DC

## 2021-01-02 NOTE — Progress Notes (Signed)
PHARMACY CONSULT NOTE FOR:  OUTPATIENT  PARENTERAL ANTIBIOTIC THERAPY (OPAT)  Indication: Perirectal Abscess Regimen: ceftriaxone 2g IV q24h and Flagyl 562m PO TID End date: 02/11/2021  IV antibiotic discharge orders are pended. To discharging provider:  please sign these orders via discharge navigator,  Select New Orders & click on the button choice - Manage This Unsigned Work.     Thank you for allowing pharmacy to be a part of this patient's care.  TPhillis Haggis5/13/2022, 2:47 PM

## 2021-01-02 NOTE — Progress Notes (Addendum)
Subjective:  HD3  Patient evaluated at bedside this AM. States he is feeling okay, just bloated. Has not eaten today, had to be NPO for the majority of the day yesterday as well. Denies chest pain, dyspnea, nausea. Pain currently well-controlled. Discussed that we will follow-up with ID, GI and MRI and discuss next steps with him this afternoon.  Objective:  Vital signs in last 24 hours: Vitals:   01/01/21 1423 01/01/21 1505 01/01/21 2100 01/02/21 0450  BP: 132/80 (!) 152/88 129/77 116/88  Pulse: 67 74 92 81  Resp: 14  18 18   Temp:  98.3 F (36.8 C) 99 F (37.2 C) 98.9 F (37.2 C)  TempSrc:  Oral Oral Oral  SpO2: 100% 100% 100% 98%  Weight:    88.5 kg  Height:       Physical Exam: General: Laying in bed, no acute distress CV: Regular rate, rhythm. No m/r/g appreciated. Distal pulses 2+ bilaterally. Pulm: Normal work of breathing on room air. Abdomen: Soft, mildly distended, non-tender. Normoactive bowel sounds. MSK: No pitting edema bilaterally. Neuro: Awake, alert, answering questions appropriately  CBC Latest Ref Rng & Units 01/02/2021 01/01/2021 12/31/2020  WBC 4.0 - 10.5 K/uL 12.5(H) 14.4(H) 14.2(H)  Hemoglobin 13.0 - 17.0 g/dL 8.9(L) 9.4(L) 8.5(L)  Hematocrit 39.0 - 52.0 % 26.1(L) 27.7(L) 25.2(L)  Platelets 150 - 400 K/uL 362 345 339   BMP Latest Ref Rng & Units 01/02/2021 01/01/2021 12/31/2020  Glucose 70 - 99 mg/dL 93 111(H) 99  BUN 6 - 20 mg/dL 12 11 14   Creatinine 0.61 - 1.24 mg/dL 1.35(H) 1.34(H) 1.36(H)  BUN/Creat Ratio 9 - 20 - - -  Sodium 135 - 145 mmol/L 136 139 138  Potassium 3.5 - 5.1 mmol/L 3.7 4.2 3.7  Chloride 98 - 111 mmol/L 110 111 112(H)  CO2 22 - 32 mmol/L 18(L) 17(L) 20(L)  Calcium 8.9 - 10.3 mg/dL 8.2(L) 8.7(L) 8.0(L)   Assessment/Plan: Rylin Seavey is 35yo person living with hidradenitis suppurativa, hypertension, previous perirectal abscesses admitted 5/10 with 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 Per GI, abnormal mucosa was taken for biopsy but no obvious IBD or malignancy. Biopsy resulted this AM w/ chronic colitis without evidence of granulomas or malignancy. R/p MRI yesterday revealed intense rectal thickening with complex fistula, inflammation of prostate, penile base, and throughout soft tissues with multiple enlarged lymph nodes. Per GI, will need to treat with IV antibiotics prior to starting therapy for IBD. General surgery did not feel patient required emergent surgery at admission. Re-consulted today, they re-emphasized need for outpatient follow-up with colorectal surgery but otherwise will not need surgical intervention during hospitalization given no acute indication. Per ID, will treat as osteomyelitis, plan on PICC placement today w/ 6 weeks of Rocephin and Flagyl.  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 Patient is a current smoker with ~10 pack-year history, but less likely RCC given age and immunocompetent. Currently patient's renal function is at baseline w/  GFR>60 and urinating well. Currently does not warrant an inpatient nephrology consult, but will need to follow-up with them in the outpatient setting. - Daily BMP - Outpatient nephrology  #Chronic macrocytic anemia Hgb stable, no signs of bleeding. Will continue with vitamin B12 supplementation, start iron supplementation after course of IV  antibiotics is complete. - Vitamin B12 daily - Daily CBC  #Hypertension Normotensive, continue holding antihypertensive.  DIET: Regular IVF: n/a DVT PPX: Lovenox BOWEL: Senna, Miralax CODE: FULL FAM COM: n/a  Prior to Admission Living Arrangement: Home Anticipated Discharge Location: pending PT/OT Barriers to Discharge: medical management Dispo: Anticipated discharge in approximately 0-1 day(s).   Sanjuan Dame, MD 01/02/2021, 12:00 PM Pager: [phone] After 5pm on weekdays and 1pm on weekends: On Call pager [phone]

## 2021-01-02 NOTE — Progress Notes (Signed)
UPDATE:  After discussing with surgery and GI earlier today, it was determined patient was stable to discharge with IV antibiotics with plan to follow-up with GI and ID in clinic. Discussed with RN early this afternoon, who said IV team had been informed this morning and was going to place PICC. This provider discussed with patient disposition plan, including discharge today. Patient agreed to plan and discharge orders were placed.  Unfortunately, [facility] was subsequently informed that IV team did not place PICC line earlier today given unknown status of discharge. Discussed with IV team this evening and there is no PICC RN available. Discharge orders cancelled, will have PICC placed in AM.  Sanjuan Dame, MD Internal Medicine PGY-1 [phone]

## 2021-01-02 NOTE — Progress Notes (Signed)
Progress Note   Subjective  No new complaints. Pain about the same.   Objective  Vital signs in last 24 hours: Temp:  [97.9 F (36.6 C)-99 F (37.2 C)] 98.9 F (37.2 C) (05/13 0450) Pulse Rate:  [67-92] 81 (05/13 0450) Resp:  [12-18] 18 (05/13 0450) BP: (116-152)/(56-88) 116/88 (05/13 0450) SpO2:  [98 %-100 %] 98 % (05/13 0450) Weight:  [88.5 kg-89.3 kg] 88.5 kg (05/13 0450) Last BM Date: 01/01/21  General: Alert, well-developed, in NAD Heart:  Regular rate and rhythm; no murmurs Chest: Clear to ascultation bilaterally Abdomen:  Soft, nontender and nondistended. Normal bowel sounds, without guarding, and without rebound.   Extremities:  Without edema. Neurologic:  Alert and  oriented x4; grossly normal neurologically. Psych:  Alert and cooperative. Normal mood and affect.  Intake/Output from previous day: 05/12 0701 - 05/13 0700 In: 428.1 [I.V.:400; IV Piggyback:28.1] Out: 500 [Urine:500] Intake/Output this shift: No intake/output data recorded.  Lab Results: Recent Labs    12/31/20 0039 01/01/21 0440 01/02/21 0155  WBC 14.2* 14.4* 12.5*  HGB 8.5* 9.4* 8.9*  HCT 25.2* 27.7* 26.1*  PLT 339 345 362   BMET Recent Labs    12/31/20 0039 01/01/21 0440 01/02/21 0155  NA 138 139 136  K 3.7 4.2 3.7  CL 112* 111 110  CO2 20* 17* 18*  GLUCOSE 99 111* 93  BUN 14 11 12   CREATININE 1.36* 1.34* 1.35*  CALCIUM 8.0* 8.7* 8.2*   LFT Recent Labs    12/31/20 0039  PROT 6.6  ALBUMIN 2.9*  AST 14*  ALT 14  ALKPHOS 78  BILITOT 0.2*    Studies/Results: MR PELVIS W WO CONTRAST  Result Date: 01/02/2021 CLINICAL DATA:  Renal rectal abscess, fever and drainage from rectum in a 35 year old male. EXAM: MRI ABDOMEN AND PELVIS WITHOUT AND WITH CONTRAST TECHNIQUE: Multiplanar multisequence MR imaging of the abdomen and pelvis was performed both before and after the administration of intravenous contrast. CONTRAST:  35m GADAVIST GADOBUTROL 1 MMOL/ML IV SOLN COMPARISON:   CT of the pelvis from April of 2022 and CT of the abdomen and pelvis from June 04, 2020. FINDINGS: COMBINED FINDINGS FOR BOTH MR ABDOMEN AND PELVIS Lower chest: No effusion. No consolidation. Limited assessment of the lung bases on MRI. Hepatobiliary: No focal, suspicious hepatic lesion. Portal vein is patent. No pericholecystic stranding but with numerous large gallstones in the gallbladder lumen. The biliary tree is nondilated. Pancreas: Normal intrinsic T1 signal. No ductal dilation or sign of inflammation. No focal lesion. Spleen:  Spleen normal size and contour. Adrenals/Urinary Tract: Adrenal glands are normal. Kidneys are normal size but with numerous cysts throughout the kidneys both in the cortical and corticomedullary portions of the kidneys. No hydronephrosis. No perinephric stranding. Many of the cysts are tiny on the order of 1-2 mm, most between 3 mm and 1 cm. Many of these show marked intrinsic T1 hyperintensity compatible with hemorrhagic or proteinaceous cysts. Some are too small for definitive characterization. Stomach/Bowel: Proximal portion of the bowel without signs of obstruction or acute process. The appendix not clearly visualized though no stranding in the area to suggest acute process. Pelvic portion of the exam is focused on the area of the rectum, per fistula/rectal protocol of the pelvis therefore bowel in the mid abdomen shows incomplete assessment. There is intense rectal thickening and perirectal stranding. Extensive presacral edema and trace amount of fluid in the presacral space. Multiple enlarged lymph nodes in the mesorectum greater than 6. For instance  on image 15 of series 9 there is a 6 mm LEFT mesorectal lymph node with similar size lymph nodes scattered throughout the mesorectum. Along the course of the superior rectal vein on image 11 of series 9 there is a 7 mm lymph node. Smaller nodes track along the superior rectal vein to the lower abdomen. Stranding and inflammation  is seen at the level of the pelvic floor including near the crus of the LEFT corpora and near the base of the penis at the pelvic floor. There is a small tract that extends anteriorly from the sphincter complex, upper margin towards the base of the penis/penile crura and corpora spongiosum best seen on images 32 and 33 of series 9. Tiny pocket of fluid in the LEFT anterior ischial rectal fat (image 31/) 9 8 x 6 mm. Another small tract extending from the LEFT lateral margin of the sphincter complex towards the LEFT ischial rectal fossa and LEFT obturator muscle (image 33, 32 and 31 of series 9 at the 3 o'clock position. At the upper margin of the sphincter complex at the pelvic floor penetrating the pubococcygeal muscle potentially is another suspected tract that may originate from the mid portion of the anal canal/sphincter complex (image 35 tracking cephalad to a small collection on image 33 of series 9.) Intense inflammation tracks along the levator musculature at the lower pelvic floor towards the RIGHT and LEFT pelvic sidewall and sacrum and coccyx. Extensive inflammation about the coccyx. Increased T2 signal within the lower sacrum and coccyx. Just above the LEFT levator musculature is inflammation extending from the lower rectum along the upper margin of the sphincter complex best seen on image 28 of series 20. No drainable fluid or discrete collection. This may ultimately penetrate the LEFT levator muscle on image 24 of series 20. This appears to originate from a transmural process (defect in the posterolateral rectal wall along the LEFT posterolateral rectum along the LEFT lateral rectum extending towards the pelvic floor a. Small fistulous tract also along the RIGHT anterior sphincter complex tracking towards the penile corpora and bulbous spongiosum, edema tracking along the RIGHT lateral rectum just above the pelvic floor on the sagittal view, image 30 of series 7. Intense inflammation also tracking  through the intersphincteric plane about the anus. Inflammation also of the prostate is suspected without definitive extension into the gland proper. Inflammation tracks into the RIGHT mid thigh in anteriorly towards the inguinal canal with fluid containing tract in this region as well tracking potentially from inflammation extending from the gluteal cleft. This is better seen on previous imaging there is stranding in the bilateral gluteal cleft as well posteriorly better seen on full field of view images obtained on a prior exam. Marked rectal thickening is noted mainly below the level of the anterior peritoneal reflection. Vascular/Lymphatic: Vascular structures in the pelvis are not well assessed. Lymph nodes about the mesorectum and just outside the mesorectum along the RIGHT pelvic sidewall with 9 mm lymph node adjacent to the obturator muscle on the RIGHT (image 23/8). Smaller lymph nodes along the RIGHT and LEFT iliac change. There is no gastrohepatic or hepatoduodenal ligament lymphadenopathy. No retroperitoneal or mesenteric lymphadenopathy. Reproductive: Suspected prostate inflammation/prostatitis, potentially secondary. Urinary bladder with smooth contours. Other:  None Musculoskeletal: Edema of sacrum and coccyx. Fluid about the sacrococcygeal region as well measuring approximately 12 x 11 mm. (Image 20/6 of the pelvic portion of the exam. Otherwise no acute musculoskeletal process. IMPRESSION: 1. Intense rectal thickening and signs of perirectal  enhancement and edema with complex fistulous network both above and below the pelvic floor. Rectal thickening extends above the APR but is predominantly below the APR. Findings are associated with small pockets of fluid, none appearing drainable. Differential considerations would include inflammatory bowel disease and neoplasm. 2. Transmural process of both the rectum and likely in the anal canal. 3. Inflammation extends to the penile crura and the base of the  corpora spongiosum. 4. Inflammation also suspected in the prostate, without definitive extension into the gland proper. 5. Enhancing tract extending into the soft tissues of the RIGHT medial thigh towards the RIGHT inguinal region potentially from the gluteal cleft. Seen better on previous study. 6. Multiple enlarged lymph nodes in the pelvis and along the RIGHT pelvic sidewall may be reactive or neoplastic. 7. Numerous large gallstones in the gallbladder lumen without evidence of cholecystitis or biliary ductal dilation. 8. Numerous renal cysts some with hemorrhagic or proteinaceous features and some too small for characterization definitively. Compatible with renal cystic disease. Would consider medullary cystic disease and early autosomal dominant polycystic kidney disease. Ultimately if not yet obtained nephrology consultation may be warranted. 9. Edema of the sacrum and coccyx suspicious for osteomyelitis with small fluid collection in this area and enhancement. Electronically Signed   By: Zetta Bills M.D.   On: 01/02/2021 08:36   MR ABDOMEN W WO CONTRAST  Result Date: 01/02/2021 CLINICAL DATA:  Renal rectal abscess, fever and drainage from rectum in a 35 year old male. EXAM: MRI ABDOMEN AND PELVIS WITHOUT AND WITH CONTRAST TECHNIQUE: Multiplanar multisequence MR imaging of the abdomen and pelvis was performed both before and after the administration of intravenous contrast. CONTRAST:  58m GADAVIST GADOBUTROL 1 MMOL/ML IV SOLN COMPARISON:  CT of the pelvis from April of 2022 and CT of the abdomen and pelvis from June 04, 2020. FINDINGS: COMBINED FINDINGS FOR BOTH MR ABDOMEN AND PELVIS Lower chest: No effusion. No consolidation. Limited assessment of the lung bases on MRI. Hepatobiliary: No focal, suspicious hepatic lesion. Portal vein is patent. No pericholecystic stranding but with numerous large gallstones in the gallbladder lumen. The biliary tree is nondilated. Pancreas: Normal intrinsic T1  signal. No ductal dilation or sign of inflammation. No focal lesion. Spleen:  Spleen normal size and contour. Adrenals/Urinary Tract: Adrenal glands are normal. Kidneys are normal size but with numerous cysts throughout the kidneys both in the cortical and corticomedullary portions of the kidneys. No hydronephrosis. No perinephric stranding. Many of the cysts are tiny on the order of 1-2 mm, most between 3 mm and 1 cm. Many of these show marked intrinsic T1 hyperintensity compatible with hemorrhagic or proteinaceous cysts. Some are too small for definitive characterization. Stomach/Bowel: Proximal portion of the bowel without signs of obstruction or acute process. The appendix not clearly visualized though no stranding in the area to suggest acute process. Pelvic portion of the exam is focused on the area of the rectum, per fistula/rectal protocol of the pelvis therefore bowel in the mid abdomen shows incomplete assessment. There is intense rectal thickening and perirectal stranding. Extensive presacral edema and trace amount of fluid in the presacral space. Multiple enlarged lymph nodes in the mesorectum greater than 6. For instance on image 15 of series 9 there is a 6 mm LEFT mesorectal lymph node with similar size lymph nodes scattered throughout the mesorectum. Along the course of the superior rectal vein on image 11 of series 9 there is a 7 mm lymph node. Smaller nodes track along the superior rectal vein  to the lower abdomen. Stranding and inflammation is seen at the level of the pelvic floor including near the crus of the LEFT corpora and near the base of the penis at the pelvic floor. There is a small tract that extends anteriorly from the sphincter complex, upper margin towards the base of the penis/penile crura and corpora spongiosum best seen on images 32 and 33 of series 9. Tiny pocket of fluid in the LEFT anterior ischial rectal fat (image 31/) 9 8 x 6 mm. Another small tract extending from the LEFT  lateral margin of the sphincter complex towards the LEFT ischial rectal fossa and LEFT obturator muscle (image 33, 32 and 31 of series 9 at the 3 o'clock position. At the upper margin of the sphincter complex at the pelvic floor penetrating the pubococcygeal muscle potentially is another suspected tract that may originate from the mid portion of the anal canal/sphincter complex (image 35 tracking cephalad to a small collection on image 33 of series 9.) Intense inflammation tracks along the levator musculature at the lower pelvic floor towards the RIGHT and LEFT pelvic sidewall and sacrum and coccyx. Extensive inflammation about the coccyx. Increased T2 signal within the lower sacrum and coccyx. Just above the LEFT levator musculature is inflammation extending from the lower rectum along the upper margin of the sphincter complex best seen on image 28 of series 20. No drainable fluid or discrete collection. This may ultimately penetrate the LEFT levator muscle on image 24 of series 20. This appears to originate from a transmural process (defect in the posterolateral rectal wall along the LEFT posterolateral rectum along the LEFT lateral rectum extending towards the pelvic floor a. Small fistulous tract also along the RIGHT anterior sphincter complex tracking towards the penile corpora and bulbous spongiosum, edema tracking along the RIGHT lateral rectum just above the pelvic floor on the sagittal view, image 30 of series 7. Intense inflammation also tracking through the intersphincteric plane about the anus. Inflammation also of the prostate is suspected without definitive extension into the gland proper. Inflammation tracks into the RIGHT mid thigh in anteriorly towards the inguinal canal with fluid containing tract in this region as well tracking potentially from inflammation extending from the gluteal cleft. This is better seen on previous imaging there is stranding in the bilateral gluteal cleft as well  posteriorly better seen on full field of view images obtained on a prior exam. Marked rectal thickening is noted mainly below the level of the anterior peritoneal reflection. Vascular/Lymphatic: Vascular structures in the pelvis are not well assessed. Lymph nodes about the mesorectum and just outside the mesorectum along the RIGHT pelvic sidewall with 9 mm lymph node adjacent to the obturator muscle on the RIGHT (image 23/8). Smaller lymph nodes along the RIGHT and LEFT iliac change. There is no gastrohepatic or hepatoduodenal ligament lymphadenopathy. No retroperitoneal or mesenteric lymphadenopathy. Reproductive: Suspected prostate inflammation/prostatitis, potentially secondary. Urinary bladder with smooth contours. Other:  None Musculoskeletal: Edema of sacrum and coccyx. Fluid about the sacrococcygeal region as well measuring approximately 12 x 11 mm. (Image 20/6 of the pelvic portion of the exam. Otherwise no acute musculoskeletal process. IMPRESSION: 1. Intense rectal thickening and signs of perirectal enhancement and edema with complex fistulous network both above and below the pelvic floor. Rectal thickening extends above the APR but is predominantly below the APR. Findings are associated with small pockets of fluid, none appearing drainable. Differential considerations would include inflammatory bowel disease and neoplasm. 2. Transmural process of both the rectum  and likely in the anal canal. 3. Inflammation extends to the penile crura and the base of the corpora spongiosum. 4. Inflammation also suspected in the prostate, without definitive extension into the gland proper. 5. Enhancing tract extending into the soft tissues of the RIGHT medial thigh towards the RIGHT inguinal region potentially from the gluteal cleft. Seen better on previous study. 6. Multiple enlarged lymph nodes in the pelvis and along the RIGHT pelvic sidewall may be reactive or neoplastic. 7. Numerous large gallstones in the gallbladder  lumen without evidence of cholecystitis or biliary ductal dilation. 8. Numerous renal cysts some with hemorrhagic or proteinaceous features and some too small for characterization definitively. Compatible with renal cystic disease. Would consider medullary cystic disease and early autosomal dominant polycystic kidney disease. Ultimately if not yet obtained nephrology consultation may be warranted. 9. Edema of the sacrum and coccyx suspicious for osteomyelitis with small fluid collection in this area and enhancement. Electronically Signed   By: Zetta Bills M.D.   On: 01/02/2021 08:36      Assessment & Recommendations   1. Rectal/coccygeal pain with rectal wall thickening, inflammation, fistulae, inflammation extending to the anal canal, to the prostate and to the base of the penis by imaging. Colonoscopy showed no clear fistulous tract, congested, indurated, thickened, friable mucosa involving about 1/2 of the rectal lumen extending from the anus to the proximal rectum. The rest of the colonoscopy to the TI was entirely normal. WBC slightly improved at 12.5. Continue antibiotics per ID recommendations. Biopsies are pending. Drs. Collene Mares and Benson Norway are on call for the weekend and they will follow up tomorrow.     LOS: 3 days   Norberto Sorenson T. Fuller Plan MD 01/02/2021, 9:34 AM [phone]

## 2021-01-02 NOTE — TOC Initial Note (Addendum)
Transition of Care [facility]) - Initial/Assessment Note    Patient Details  Name: Shawn [name] MRN: [id number] Date of Birth: [birth year]  [facility] of [facility]) CM/SW Contact:    Marilu Favre, RN Phone Number: 01/02/2021, 1:23 PM  Clinical Narrative:                 Patient from home alone. Confirmed face sheet information.   CCS consult pending.  Discussed home IV ABX for 6 weeks. Pam with [facility] will provide teaching prior to discharge. NCM asked if he had a friend or family member who could assist , because HHRN will not be present at home every time a dose is due. Patient stated he will make some calls.  Cory with [facility] following .   Will see how teaching does with patient and if he has assistance at home.  Await CCS recommendations.   Will need home health orders OPAT   Expected Discharge Plan: [place]     Patient Goals and CMS Choice Patient states their goals for this hospitalization and ongoing recovery are:: to return to home CMS Medicare.gov Compare Post Acute Care list provided to:: Patient Choice offered to / list presented to : Patient  Expected Discharge Plan and Services Expected Discharge Plan: [employer]   Discharge Planning Services: CM Consult Post Acute Care Choice: [place] arrangements for the past 2 months: Single Family Home                 DME Arranged: N/A         HH Arranged: RN [place] Agency: [place] Date [place]: 01/02/21 Time HH Agency Contacted: [place] Representative spoke with at [place]: Tommi Rumps  Prior Living Arrangements/Services Living arrangements for the past 2 months: [place] with:: Self Patient language and need for interpreter reviewed:: Yes Do you feel safe going back to the place where you live?: Yes      Need for Family Participation in Patient Care: Yes (Comment) Care giver support system in place?: Yes (comment)    Criminal Activity/Legal Involvement Pertinent to Current Situation/Hospitalization: No - Comment as needed  Activities of Daily Living Home Assistive Devices/Equipment: None ADL Screening (condition at time of admission) Patient's cognitive ability adequate to safely complete daily activities?: Yes Is the patient deaf or have difficulty hearing?: No Does the patient have difficulty seeing, even when wearing glasses/contacts?: No Does the patient have difficulty concentrating, remembering, or making decisions?: No Patient able to express need for assistance with ADLs?: Yes Does the patient have difficulty dressing or bathing?: No Independently performs ADLs?: Yes (appropriate for developmental age) Does the patient have difficulty walking or climbing stairs?: No Weakness of Legs: None Weakness of Arms/Hands: None  Permission Sought/Granted   Permission granted to share information with : No              Emotional Assessment Appearance:: Appears stated age Attitude/Demeanor/Rapport: Engaged Affect (typically observed): Accepting Orientation: : Oriented to Self,Oriented to Place,Oriented to  Time,Oriented to Situation Alcohol / Substance Use: Not Applicable Psych Involvement: No (comment)  Admission diagnosis:  Perirectal abscess [K61.1] Perirectal cellulitis [K61.1] Fistula [L98.8] Patient Active Problem List   Diagnosis Date Noted  . Abnormal CT scan, colon   . Rectal pain   . Perirectal abscess 12/30/2020  . Perirectal cellulitis   . Fistula   . Lower urinary tract infectious disease 06/04/2020  . Renal lesion 06/04/2020  .  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 Alcohol abuse 11/06/2019  . Tobacco abuse 05/18/2013  . Dilated cardiomyopathy ([place]) 05/18/2013  . Abscess, perirectal, x2  05/18/2013  . Chest pain at rest 11/20/2012  . Hypertension 11/20/2012   PCP:  Charlott Rakes, MD Pharmacy:   [facility], [address] [place] [address] Phone: [phone] Fax: [phone]     Social Determinants of Health (SDOH) Interventions    Readmission Risk Interventions No flowsheet data found.

## 2021-01-02 NOTE — Consult Note (Signed)
Shawn [name] July 17, 1986  [id number].    Requesting MD: Dr. Gilles Chiquito Chief Complaint/Reason for Consult: complex fistulae of pelvis  HPI:  This is a 35 yo black male with a history of a dilated cardiomyopathy in 2014 with a recent relatively normal echo and EF of 65-70% who began having some UTI type symptoms in October of 2021 after recently being treated for a UTI.  He saw the ED and had a CT scan show a prominence of the rectum which could be related to rectal carcinoma vs proctitis.  He was given 10 days of Bactrim and a referral was made to GI as well as urology due to his UTIs.  The patient failed to follow up due to financial issues.  He does state he thinks he had some blood in his stool at that time, but really doesn't know.  He then returned in March 2022 with "tailbone" pain.  He was noted to have a right groin abscess, right medial thigh, and left perianal region.  He declined drainage by the ED at that time.  He also noted gray discharge from his penis for 7-8 months and also declined STI testing.  He was discharged home on doxy for 7 days.  He represented at the end of April for persisted coccygeal pain.  He underwent an MRI which revealed pelvic lymphadenopathy and a markedly abnormal long segment wall thickening in the rectum with extensive edema and inflammation.  He was also found to have edema tracking to the presacral space which abnormal signal of the coccyx and osteo could not be ruled out.  He was once again discharged home with levaquin/flagyl with again close follow up recommendations to see GI.    He presented back to the [facility] this past Tuesday with continued pain sitting/standing.  He denies fevers.  He denies diarrhea, CP, SOB, history or family history of crohn's disease, UC, colon/rectal cancer, etc.  He has been eating normally and only complains of pain.  He has no areas that are abscesses or draining.  He was admitted for further management given persistent  symptoms and inability to follow up.  GI was consulted and he underwent a colonoscopy which revealed evidence of fistula as well as abnormal mucosa that was biopsied and revealed chronic colitis, IBD could not be ruled out.  He underwent recurrent MRI today with extensive findings.  Please defer to this read for full details.  We have been asked to weigh in on this issue.  ROS: ROS: Please see HPI, otherwise all other systems have been reviewed and are negative.  Family History  Problem Relation Age of Onset  . Diabetes Mother   . Hypertension Other     Past Medical History:  Diagnosis Date  . Abscess, perirectal, x2  05/18/2013  . 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 Dilated cardiomyopathy ([place]) 05/18/2013   By catheterization 2014   . Hypertension     Past Surgical History:  Procedure Laterality Date  . LEFT AND RIGHT HEART CATHETERIZATION WITH CORONARY ANGIOGRAM N/A 11/21/2012   Procedure: LEFT AND RIGHT HEART CATHETERIZATION WITH CORONARY ANGIOGRAM;  Surgeon: Birdie Riddle, MD;  Location: [facility];  Service: Cardiovascular;  Laterality: N/A;  . MANDIBLE FRACTURE SURGERY  2006    Social History:  reports that he has been smoking cigarettes. He has been smoking about 1.00 pack per day. He has never used smokeless tobacco. He reports current alcohol use.  He reports that he does not use drugs.  Allergies:  Allergies  Allergen Reactions  . Shrimp [Shellfish Allergy] Shortness Of Breath    Medications Prior to Admission  Medication Sig Dispense Refill  . docusate sodium (COLACE) 100 MG capsule Take 1 capsule (100 mg total) by mouth every 12 (twelve) hours. 60 capsule 0  . HYDROcodone-acetaminophen (NORCO/VICODIN) 5-325 MG tablet Take 1-2 tablets by mouth every 6 (six) hours as needed for moderate pain or severe pain. 20 tablet 0  . lisinopril (ZESTRIL) 10 MG tablet Take 1 tablet (10 mg total) by mouth daily. 30 tablet 6  . ibuprofen (ADVIL) 800 MG  tablet Take 1 tablet (800 mg total) by mouth 2 (two) times daily as needed. (Patient not taking: Reported on 12/31/2020) 60 tablet 1  . levofloxacin (LEVAQUIN) 750 MG tablet Take 1 tablet (750 mg total) by mouth daily. X 7 days (Patient not taking: Reported on 12/31/2020) 7 tablet 0  . metroNIDAZOLE (FLAGYL) 500 MG tablet Take 1 tablet (500 mg total) by mouth 2 (two) times daily. One po bid x 7 days (Patient not taking: Reported on 12/31/2020) 14 tablet 0     Physical Exam: Blood pressure 123/81, pulse 62, temperature 98.4 F (36.9 C), temperature source Oral, resp. rate 18, height 6' 0.5" (1.842 m), weight 88.5 kg, SpO2 100 %. General: pleasant, WD, WN blackmale who is laying in bed in NAD HEENT: head is normocephalic, atraumatic.  Sclera are noninjected.  PERRL.  Ears and nose without any masses or lesions.  Mouth is pink and moist Heart: regular, rate, and rhythm.  Normal s1,s2. No obvious murmurs, gallops, or rubs noted.  Palpable radial and pedal pulses bilaterally Lungs: CTAB, no wheezes, rhonchi, or rales noted.  Respiratory effort nonlabored Abd: soft, NT, ND, +BS, no masses, hernias, or organomegaly MS: all 4 extremities are symmetrical with no cyanosis, clubbing, or edema. Skin: warm and dry with no masses, lesions, or rashes.  Some areas of scarring noted in his perineum and perirectal area. Neuro: Cranial nerves 2-12 grossly intact, sensation is normal throughout Psych: A&Ox3 with an appropriate affect.   Results for orders placed or performed during the hospital encounter of 12/30/20 (from the past 48 hour(s))  CBC     Status: Abnormal   Collection Time: 01/01/21  4:40 AM  Result Value Ref Range   WBC 14.4 (H) 4.0 - 10.5 K/uL   RBC 2.67 (L) 4.22 - 5.81 MIL/uL   Hemoglobin 9.4 (L) 13.0 - 17.0 g/dL   HCT 27.7 (L) 39.0 - 52.0 %   MCV 103.7 (H) 80.0 - 100.0 fL   MCH 35.2 (H) 26.0 - 34.0 pg   MCHC 33.9 30.0 - 36.0 g/dL   RDW 18.1 (H) 11.5 - 15.5 %   Platelets 345 150 - 400 K/uL    nRBC 0.0 0.0 - 0.2 %    Comment: Performed at [facility], [address], [place], [place] [address]  Basic metabolic panel     Status: Abnormal   Collection Time: 01/01/21  4:40 AM  Result Value Ref Range   Sodium 139 135 - 145 mmol/L   Potassium 4.2 3.5 - 5.1 mmol/L   Chloride 111 98 - 111 mmol/L   CO2 17 (L) 22 - 32 mmol/L   Glucose, Bld 111 (H) 70 - 99 mg/dL    Comment: Glucose reference range applies only to samples taken after fasting for at least 8 hours.   BUN 11 6 - 20 mg/dL  Creatinine, Ser 1.34 (H) 0.61 - 1.24 mg/dL   Calcium 8.7 (L) 8.9 - 10.3 mg/dL   GFR, Estimated >60 >60 mL/min    Comment: (NOTE) Calculated using the CKD-EPI Creatinine Equation (2021)    Anion gap 11 5 - 15    Comment: Performed at [facility] [address], [place], [place] [address]  Surgical pathology     Status: None   Collection Time: 01/01/21  1:21 PM  Result Value Ref Range   SURGICAL PATHOLOGY      SURGICAL PATHOLOGY CASE: [id number] PATIENT: Sukhman Alix Surgical Pathology Report     Clinical History: pain in coccyx, abnormal rectum on CT imaging going back many months, erosion and edema, R/O inflammation (cm)   FINAL MICROSCOPIC DIAGNOSIS:  A. RECTUM, BIOPSY: -  Chronic colitis with focal activity -  No granulomata, dysplasia or malignancy identified -  See comment  COMMENT:  The histologic features are non-specific and can be seen in low grade ischemia, medication effect, self-limited infectious processes and inflammatory bowel disease.  Clinical-pathologic correlation is recommended.  GROSS DESCRIPTION:  Received in formalin are tan, soft tissue fragments that are submitted in toto. Number: 4 size: 0.3-0.4 cm blocks: 1 (GRP 01/01/2021)   Final Diagnosis performed by Thressa Sheller, MD.   Electronically signed 01/02/2021 Technical and / or Professional components performed at [facility], [place] [address], [place], [place].  Immunohistochemistry Technical component (if applicable) was performed at [facility]. [address], [place], [place], [place] [address].   IMMUNOHISTOCHEMISTRY DISCLAIMER (if applicable): Some of these immunohistochemical stains may have been developed and the performance characteristics determine by [facility]. Some may not have been cleared or approved by the U.S. Food and Drug Administration. The FDA has determined that such clearance or approval is not necessary. This test is used for clinical purposes. It should not be regarded as investigational or for research. This laboratory is certified under the [place] (CLIA-88) as qualified to perform high complexity clinical laboratory testing.  The controls stained appropriately.   CBC     Status: Abnormal   Collection Time: 01/02/21  1:55 AM  Result Value Ref Range   WBC 12.5 (H) 4.0 - 10.5 K/uL   RBC 2.56 (L) 4.22 - 5.81 MIL/uL   Hemoglobin 8.9 (L) 13.0 - 17.0 g/dL   HCT 26.1 (L) 39.0 - 52.0 %   MCV 102.0 (H) 80.0 - 100.0 fL   MCH 34.8 (H) 26.0 - 34.0 pg   MCHC 34.1 30.0 - 36.0 g/dL   RDW 17.6 (H) 11.5 - 15.5 %   Platelets 362 150 - 400 K/uL   nRBC 0.0 0.0 - 0.2 %    Comment: Performed at [facility] [address], [place], [place] [address]  Basic metabolic panel     Status: Abnormal   Collection Time: 01/02/21  1:55 AM  Result Value Ref Range   Sodium 136 135 - 145 mmol/L   Potassium 3.7 3.5 - 5.1 mmol/L   Chloride 110 98 - 111 mmol/L   CO2 18 (L) 22 - 32 mmol/L   Glucose, Bld 93 70 - 99 mg/dL    Comment: Glucose reference range applies only to samples taken after fasting for at least 8 hours.   BUN 12 6 - 20 mg/dL   Creatinine, Ser 1.35 (H) 0.61 - 1.24 mg/dL   Calcium 8.2 (L) 8.9 - 10.3 mg/dL  GFR, Estimated >60 >60 mL/min    Comment: (NOTE) Calculated using the CKD-EPI Creatinine Equation (2021)    Anion gap 8 5 - 15     Comment: Performed at [facility], [place] [address] [place], [place] [address]  Hepatitis B surface antigen     Status: None   Collection Time: 01/02/21 10:04 AM  Result Value Ref Range   Hepatitis B Surface Ag NON REACTIVE NON REACTIVE    Comment: Performed at [facility] [address], [place], [place] [address]   MR PELVIS W WO CONTRAST  Result Date: 01/02/2021 CLINICAL DATA:  Renal rectal abscess, fever and drainage from rectum in a 35 year old male. EXAM: MRI ABDOMEN AND PELVIS WITHOUT AND WITH CONTRAST TECHNIQUE: Multiplanar multisequence MR imaging of the abdomen and pelvis was performed both before and after the administration of intravenous contrast. CONTRAST:  61m GADAVIST GADOBUTROL 1 MMOL/ML IV SOLN COMPARISON:  CT of the pelvis from April of 2022 and CT of the abdomen and pelvis from June 04, 2020. FINDINGS: COMBINED FINDINGS FOR BOTH MR ABDOMEN AND PELVIS Lower chest: No effusion. No consolidation. Limited assessment of the lung bases on MRI. Hepatobiliary: No focal, suspicious hepatic lesion. Portal vein is patent. No pericholecystic stranding but with numerous large gallstones in the gallbladder lumen. The biliary tree is nondilated. Pancreas: Normal intrinsic T1 signal. No ductal dilation or sign of inflammation. No focal lesion. Spleen:  Spleen normal size and contour. Adrenals/Urinary Tract: Adrenal glands are normal. Kidneys are normal size but with numerous cysts throughout the kidneys both in the cortical and corticomedullary portions of the kidneys. No hydronephrosis. No perinephric stranding. Many of the cysts are tiny on the order of 1-2 mm, most between 3 mm and 1 cm. Many of these show marked intrinsic T1 hyperintensity compatible with hemorrhagic or proteinaceous cysts. Some are too small for definitive characterization. Stomach/Bowel: Proximal portion of the bowel without signs of obstruction or acute process. The appendix not clearly visualized though no  stranding in the area to suggest acute process. Pelvic portion of the exam is focused on the area of the rectum, per fistula/rectal protocol of the pelvis therefore bowel in the mid abdomen shows incomplete assessment. There is intense rectal thickening and perirectal stranding. Extensive presacral edema and trace amount of fluid in the presacral space. Multiple enlarged lymph nodes in the mesorectum greater than 6. For instance on image 15 of series 9 there is a 6 mm LEFT mesorectal lymph node with similar size lymph nodes scattered throughout the mesorectum. Along the course of the superior rectal vein on image 11 of series 9 there is a 7 mm lymph node. Smaller nodes track along the superior rectal vein to the lower abdomen. Stranding and inflammation is seen at the level of the pelvic floor including near the crus of the LEFT corpora and near the base of the penis at the pelvic floor. There is a small tract that extends anteriorly from the sphincter complex, upper margin towards the base of the penis/penile crura and corpora spongiosum best seen on images 32 and 33 of series 9. Tiny pocket of fluid in the LEFT anterior ischial rectal fat (image 31/) 9 8 x 6 mm. Another small tract extending from the LEFT lateral margin of the sphincter complex towards the LEFT ischial rectal fossa and LEFT obturator muscle (image 33, 32 and 31 of series 9 at the 3 o'clock position. At the upper margin of the sphincter complex at the pelvic  floor penetrating the pubococcygeal muscle potentially is another suspected tract that may originate from the mid portion of the anal canal/sphincter complex (image 35 tracking cephalad to a small collection on image 33 of series 9.) Intense inflammation tracks along the levator musculature at the lower pelvic floor towards the RIGHT and LEFT pelvic sidewall and sacrum and coccyx. Extensive inflammation about the coccyx. Increased T2 signal within the lower sacrum and coccyx. Just above the  LEFT levator musculature is inflammation extending from the lower rectum along the upper margin of the sphincter complex best seen on image 28 of series 20. No drainable fluid or discrete collection. This may ultimately penetrate the LEFT levator muscle on image 24 of series 20. This appears to originate from a transmural process (defect in the posterolateral rectal wall along the LEFT posterolateral rectum along the LEFT lateral rectum extending towards the pelvic floor a. Small fistulous tract also along the RIGHT anterior sphincter complex tracking towards the penile corpora and bulbous spongiosum, edema tracking along the RIGHT lateral rectum just above the pelvic floor on the sagittal view, image 30 of series 7. Intense inflammation also tracking through the intersphincteric plane about the anus. Inflammation also of the prostate is suspected without definitive extension into the gland proper. Inflammation tracks into the RIGHT mid thigh in anteriorly towards the inguinal canal with fluid containing tract in this region as well tracking potentially from inflammation extending from the gluteal cleft. This is better seen on previous imaging there is stranding in the bilateral gluteal cleft as well posteriorly better seen on full field of view images obtained on a prior exam. Marked rectal thickening is noted mainly below the level of the anterior peritoneal reflection. Vascular/Lymphatic: Vascular structures in the pelvis are not well assessed. Lymph nodes about the mesorectum and just outside the mesorectum along the RIGHT pelvic sidewall with 9 mm lymph node adjacent to the obturator muscle on the RIGHT (image 23/8). Smaller lymph nodes along the RIGHT and LEFT iliac change. There is no gastrohepatic or hepatoduodenal ligament lymphadenopathy. No retroperitoneal or mesenteric lymphadenopathy. Reproductive: Suspected prostate inflammation/prostatitis, potentially secondary. Urinary bladder with smooth contours.  Other:  None Musculoskeletal: Edema of sacrum and coccyx. Fluid about the sacrococcygeal region as well measuring approximately 12 x 11 mm. (Image 20/6 of the pelvic portion of the exam. Otherwise no acute musculoskeletal process. IMPRESSION: 1. Intense rectal thickening and signs of perirectal enhancement and edema with complex fistulous network both above and below the pelvic floor. Rectal thickening extends above the APR but is predominantly below the APR. Findings are associated with small pockets of fluid, none appearing drainable. Differential considerations would include inflammatory bowel disease and neoplasm. 2. Transmural process of both the rectum and likely in the anal canal. 3. Inflammation extends to the penile crura and the base of the corpora spongiosum. 4. Inflammation also suspected in the prostate, without definitive extension into the gland proper. 5. Enhancing tract extending into the soft tissues of the RIGHT medial thigh towards the RIGHT inguinal region potentially from the gluteal cleft. Seen better on previous study. 6. Multiple enlarged lymph nodes in the pelvis and along the RIGHT pelvic sidewall may be reactive or neoplastic. 7. Numerous large gallstones in the gallbladder lumen without evidence of cholecystitis or biliary ductal dilation. 8. Numerous renal cysts some with hemorrhagic or proteinaceous features and some too small for characterization definitively. Compatible with renal cystic disease. Would consider medullary cystic disease and early autosomal dominant polycystic kidney disease. Ultimately if not  yet obtained nephrology consultation may be warranted. 9. Edema of the sacrum and coccyx suspicious for osteomyelitis with small fluid collection in this area and enhancement. Electronically Signed   By: Zetta Bills M.D.   On: 01/02/2021 08:36   MR ABDOMEN W WO CONTRAST  Result Date: 01/02/2021 CLINICAL DATA:  Renal rectal abscess, fever and drainage from rectum in a  35 year old male. EXAM: MRI ABDOMEN AND PELVIS WITHOUT AND WITH CONTRAST TECHNIQUE: Multiplanar multisequence MR imaging of the abdomen and pelvis was performed both before and after the administration of intravenous contrast. CONTRAST:  26m GADAVIST GADOBUTROL 1 MMOL/ML IV SOLN COMPARISON:  CT of the pelvis from April of 2022 and CT of the abdomen and pelvis from June 04, 2020. FINDINGS: COMBINED FINDINGS FOR BOTH MR ABDOMEN AND PELVIS Lower chest: No effusion. No consolidation. Limited assessment of the lung bases on MRI. Hepatobiliary: No focal, suspicious hepatic lesion. Portal vein is patent. No pericholecystic stranding but with numerous large gallstones in the gallbladder lumen. The biliary tree is nondilated. Pancreas: Normal intrinsic T1 signal. No ductal dilation or sign of inflammation. No focal lesion. Spleen:  Spleen normal size and contour. Adrenals/Urinary Tract: Adrenal glands are normal. Kidneys are normal size but with numerous cysts throughout the kidneys both in the cortical and corticomedullary portions of the kidneys. No hydronephrosis. No perinephric stranding. Many of the cysts are tiny on the order of 1-2 mm, most between 3 mm and 1 cm. Many of these show marked intrinsic T1 hyperintensity compatible with hemorrhagic or proteinaceous cysts. Some are too small for definitive characterization. Stomach/Bowel: Proximal portion of the bowel without signs of obstruction or acute process. The appendix not clearly visualized though no stranding in the area to suggest acute process. Pelvic portion of the exam is focused on the area of the rectum, per fistula/rectal protocol of the pelvis therefore bowel in the mid abdomen shows incomplete assessment. There is intense rectal thickening and perirectal stranding. Extensive presacral edema and trace amount of fluid in the presacral space. Multiple enlarged lymph nodes in the mesorectum greater than 6. For instance on image 15 of series 9 there is a 6  mm LEFT mesorectal lymph node with similar size lymph nodes scattered throughout the mesorectum. Along the course of the superior rectal vein on image 11 of series 9 there is a 7 mm lymph node. Smaller nodes track along the superior rectal vein to the lower abdomen. Stranding and inflammation is seen at the level of the pelvic floor including near the crus of the LEFT corpora and near the base of the penis at the pelvic floor. There is a small tract that extends anteriorly from the sphincter complex, upper margin towards the base of the penis/penile crura and corpora spongiosum best seen on images 32 and 33 of series 9. Tiny pocket of fluid in the LEFT anterior ischial rectal fat (image 31/) 9 8 x 6 mm. Another small tract extending from the LEFT lateral margin of the sphincter complex towards the LEFT ischial rectal fossa and LEFT obturator muscle (image 33, 32 and 31 of series 9 at the 3 o'clock position. At the upper margin of the sphincter complex at the pelvic floor penetrating the pubococcygeal muscle potentially is another suspected tract that may originate from the mid portion of the anal canal/sphincter complex (image 35 tracking cephalad to a small collection on image 33 of series 9.) Intense inflammation tracks along the levator musculature at the lower pelvic floor towards the RIGHT and LEFT  pelvic sidewall and sacrum and coccyx. Extensive inflammation about the coccyx. Increased T2 signal within the lower sacrum and coccyx. Just above the LEFT levator musculature is inflammation extending from the lower rectum along the upper margin of the sphincter complex best seen on image 28 of series 20. No drainable fluid or discrete collection. This may ultimately penetrate the LEFT levator muscle on image 24 of series 20. This appears to originate from a transmural process (defect in the posterolateral rectal wall along the LEFT posterolateral rectum along the LEFT lateral rectum extending towards the pelvic  floor a. Small fistulous tract also along the RIGHT anterior sphincter complex tracking towards the penile corpora and bulbous spongiosum, edema tracking along the RIGHT lateral rectum just above the pelvic floor on the sagittal view, image 30 of series 7. Intense inflammation also tracking through the intersphincteric plane about the anus. Inflammation also of the prostate is suspected without definitive extension into the gland proper. Inflammation tracks into the RIGHT mid thigh in anteriorly towards the inguinal canal with fluid containing tract in this region as well tracking potentially from inflammation extending from the gluteal cleft. This is better seen on previous imaging there is stranding in the bilateral gluteal cleft as well posteriorly better seen on full field of view images obtained on a prior exam. Marked rectal thickening is noted mainly below the level of the anterior peritoneal reflection. Vascular/Lymphatic: Vascular structures in the pelvis are not well assessed. Lymph nodes about the mesorectum and just outside the mesorectum along the RIGHT pelvic sidewall with 9 mm lymph node adjacent to the obturator muscle on the RIGHT (image 23/8). Smaller lymph nodes along the RIGHT and LEFT iliac change. There is no gastrohepatic or hepatoduodenal ligament lymphadenopathy. No retroperitoneal or mesenteric lymphadenopathy. Reproductive: Suspected prostate inflammation/prostatitis, potentially secondary. Urinary bladder with smooth contours. Other:  None Musculoskeletal: Edema of sacrum and coccyx. Fluid about the sacrococcygeal region as well measuring approximately 12 x 11 mm. (Image 20/6 of the pelvic portion of the exam. Otherwise no acute musculoskeletal process. IMPRESSION: 1. Intense rectal thickening and signs of perirectal enhancement and edema with complex fistulous network both above and below the pelvic floor. Rectal thickening extends above the APR but is predominantly below the APR.  Findings are associated with small pockets of fluid, none appearing drainable. Differential considerations would include inflammatory bowel disease and neoplasm. 2. Transmural process of both the rectum and likely in the anal canal. 3. Inflammation extends to the penile crura and the base of the corpora spongiosum. 4. Inflammation also suspected in the prostate, without definitive extension into the gland proper. 5. Enhancing tract extending into the soft tissues of the RIGHT medial thigh towards the RIGHT inguinal region potentially from the gluteal cleft. Seen better on previous study. 6. Multiple enlarged lymph nodes in the pelvis and along the RIGHT pelvic sidewall may be reactive or neoplastic. 7. Numerous large gallstones in the gallbladder lumen without evidence of cholecystitis or biliary ductal dilation. 8. Numerous renal cysts some with hemorrhagic or proteinaceous features and some too small for characterization definitively. Compatible with renal cystic disease. Would consider medullary cystic disease and early autosomal dominant polycystic kidney disease. Ultimately if not yet obtained nephrology consultation may be warranted. 9. Edema of the sacrum and coccyx suspicious for osteomyelitis with small fluid collection in this area and enhancement. Electronically Signed   By: Zetta Bills M.D.   On: 01/02/2021 08:36   [place] EKG SITE RITE  Result Date: 01/02/2021 If [facility]  image not attached, placement could not be confirmed due to current cardiac rhythm.     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 Marlou Starks as well as one of our colorectal surgeons.  It is of their opinion based on symptoms and imaging that he likely has crohn's disease involving his perirectal region.  There is nothing drainable or  surgical at this time.  He would need aggressive medical management for presumed crohn's.  Pending how this goes would determine whether he may need anything surgical down the line.  If he ultimately were to fail medical management and continue to worsen, he would likely require a diverting colostomy in order to "cool things off" and then go from there.  However, if he were to respond well, and things got under control, at some point he may be a candidate for an LAR.  At this point there is nothing acutely surgical to do.  He will need GI follow up for further management and then he can be referred to a colorectal surgeon as needed.  With that being said, he does have some prostatic changes and changes near the base of his penis.  If he were to begin having urethral involvement or more urologic symptoms, he then may need to be referred to a tertiary care facility for surgical evaluation due to multi-specialty needs.  We will sign off at this time.   Henreitta Cea, [facility] 01/02/2021, 3:28 PM Please see Amion for pager number during day hours 7:00am-4:30pm or 7:00am -11:30am on weekends

## 2021-01-02 NOTE — Plan of Care (Signed)

## 2021-01-02 NOTE — Discharge Summary (Signed)
Name: Shawn [name] MRN: [id number] DOB: 1986/01/06 35 y.o. PCP: Charlott Rakes, MD  Date of Admission: 12/30/2020  3:58 PM Date of Discharge: 01/03/2021 Attending Physician: Sid Falcon, MD  Subjective: Patient evaluated at bedside this AM. States he is doing okay, still having some pain. Otherwise has been able to eat some, having BM. Discussed plan to discharge and 6 weeks IV antibiotics with plans to follow-up with GI and ID.  Discharge Diagnosis: 1. 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 Hemorrhagic/proteinaceous renal cysts 3. Chronic macrocytic anemia 4. Hypertension 5. Hidradenitis suppurativa   Discharge Medications: Allergies as of 01/02/2021      Reactions   Shrimp [shellfish Allergy] Shortness Of Breath      Medication List    STOP taking these medications   HYDROcodone-acetaminophen 5-325 MG tablet Commonly known as: NORCO/VICODIN   ibuprofen 800 MG tablet Commonly known as: ADVIL   levofloxacin 750 MG tablet Commonly known as: Levaquin     TAKE these medications   cefTRIAXone  IVPB Commonly known as: ROCEPHIN Inject 2 g into the vein daily. Indication:  Perirectal Abscess First Dose: No Last Day of Therapy:  02/11/2021 Labs - Once weekly:  CBC/D and BMP, Labs - Every other week:  ESR and CRP Method of administration: IV Push Method of administration may be changed at the discretion of home infusion pharmacist based upon assessment of the patient and/or caregiver's ability to self-administer the medication ordered.   cyanocobalamin 1000 MCG tablet Take 1 tablet (1,000 mcg total) by mouth daily. Start taking on: Jan 03, 2021   docusate sodium 100 MG capsule Commonly known as: COLACE Take 1 capsule (100 mg total) by mouth every 12 (twelve) hours.   lisinopril 10 MG tablet Commonly known as: ZESTRIL Take 1 tablet (10 mg total) by mouth daily.   metroNIDAZOLE 500 MG tablet Commonly known  as: Flagyl Take 1 tablet (500 mg total) by mouth 3 (three) times daily. What changed:   when to take this  additional instructions   oxyCODONE 5 MG immediate release tablet Commonly known as: Oxy IR/ROXICODONE Take 1 tablet (5 mg total) by mouth every 4 (four) hours as needed for up to 5 days for severe pain.            Discharge Care Instructions  (From admission, onward)         Start     Ordered   01/02/21 0000  Change dressing on IV access line weekly and PRN  (Home infusion instructions - [facility] )        01/02/21 1540          Disposition and follow-up:   Mr.Bertin Rodriques was discharged from [facility] in Stable condition.  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 Will follow-up with GI for possible IBD treatment.   Hemorrhagic/proteinaceous renal cysts: Renal function normal w/ GFR >60. Will need to follow-up with nephrology for continued monitoring.   Chronic macrocytic anemia: Started on vitamin B12 supplementation daily. Can consider adding iron supplementation after completes course of IV antibiotics.   Hidradenitis suppurativa: Can consider referral to [facility] for hidradenitis clinic.   2.  Labs / imaging needed at time of follow-up: CBC, BMP  3.  Pending labs/ test needing follow-up: n/a  Follow-up Appointments:  Follow-up Information  Charlott Rakes, MD. Schedule an appointment as soon as possible for a visit in 1 week(s).   Specialty: Family Medicine Contact information: [place] [address] [phone]        [place], [place]. Schedule an appointment as soon as possible for a visit in 1 week(s).   Contact information: [place] [address] [phone]        [facility]              . Schedule an appointment as soon as possible for a visit in 6 week(s).   Contact information: [address] [place] [address]       Irene Shipper, MD Follow up.   Specialty: Gastroenterology Contact information: [address] [place] [address] [phone]              Hospital Course by problem list: 1. 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 Previously had MRI of pelvis which revealed wall thickening in the rectum and possible osteomyelitis in ED at the end of April, with plans to follow-up with GI in June. However, the pain had worsened so he was admitted for further evaluation. In ED, general surgery and GI were consulted. GI was to perform colonoscopy, while general surgery wanted to wait for results of colonoscopy before seeing patient. Per GI, no obvious IBD or malignancy, but recommended further evaluation by surgery. Repeat MRI later that day revealed intense rectal thickening and signs of perirectal enhancement with complex fistulous network along with extensive pelvic inflammation with pelvic lymphadenopathy and suspected osteomyelitis. ID was consulted and recommended 6 weeks of IV antibiotics for osteomyelitis. Surgery was re-consulted the next day, but continued to say no emergent surgical evaluation needed. Discussed with GI on 5/13, who recommended further evaluation of patient under general anesthesia. GI discussed with general surgery later that afternoon, who again said there is no emergent surgical indication and recommended following up with GI and eventually colorectal surgery. Discussed with patient, who agreed for discharge. Unfortunately, patient had to stay one more night for PICC line placement. Patient to follow-up with GI and ID after discharge.    2. Hemorrhagic/proteinaceous renal cysts: Patient has baseline creatinine 1.3-1.5 with normal GFR. He underwent CT renal stone study in October 2021 which revealed several low-attenuating lesions in bilateral kidneys. During repeat pelvic MRI during this admission, also obtained abdominal MRI for further evaluation. Imaging during this admission revealed numerous renal cysts with hemorrhagic or proteinaceous features. At this point no need for inpatient nephrology consult given normal renal function. Will have patient follow-up with nephrology for further monitoring in outpatient setting.  3. Chronic macrocytic anemia: Patient with chronic anemia. Lab work revealed vitamin B12 deficiency with anemia of chronic disease. Patient was started on vitamin B12 daily supplementation. Given infection will hold off further iron supplementation, but can start after course of IV antibiotics.  4. Hypertension: Patient remained normotensive throughout admission. Can re-start home lisinopril upon discharge and can re-evaluate with PCP.   5. Hidradenitis suppurativa: No active rash or lesions noticed throughout admission. If patient has difficulties in the future can consider referral to [facility] hidradenitis clinic.  Discharge Exam:   BP 123/81 (BP Location: Right Arm)   Pulse 62   Temp 98.4 F (36.9 C) (Oral)   Resp 18   Ht 6' 0.5" (  1.842 m)   Wt 88.5 kg   SpO2 100%   BMI 26.08 kg/m  General: Laying in bed in no acute distress CV: Regular rate, rhythm. No m/r/g appreciated. Distal pulses 2+ bilaterally. Pulm: Normal work of breathing on room air GI: Soft, non-tender, non-distended. Normoactive bowel sounds. MSK: No pitting edema bilaterally. Psych: Flat affect, normal speech.  Pertinent Labs, Studies, and Procedures:  CBC Latest Ref Rng & Units 01/02/2021 01/01/2021 12/31/2020  WBC 4.0 - 10.5 K/uL 12.5(H) 14.4(H) 14.2(H)  Hemoglobin 13.0 - 17.0 g/dL 8.9(L) 9.4(L) 8.5(L)  Hematocrit 39.0 - 52.0 % 26.1(L)  27.7(L) 25.2(L)  Platelets 150 - 400 K/uL 362 345 339   BMP Latest Ref Rng & Units 01/02/2021 01/01/2021 12/31/2020  Glucose 70 - 99 mg/dL 93 111(H) 99  BUN 6 - 20 mg/dL 12 11 14   Creatinine 0.61 - 1.24 mg/dL 1.35(H) 1.34(H) 1.36(H)  BUN/Creat Ratio 9 - 20 - - -  Sodium 135 - 145 mmol/L 136 139 138  Potassium 3.5 - 5.1 mmol/L 3.7 4.2 3.7  Chloride 98 - 111 mmol/L 110 111 112(H)  CO2 22 - 32 mmol/L 18(L) 17(L) 20(L)  Calcium 8.9 - 10.3 mg/dL 8.2(L) 8.7(L) 8.0(L)   Colonoscopy 5/12: The perianal and digital rectal examinations were normal. The terminal ileum appeared normal. An area of significantly congested, indurated, friable mucosa was found in the distal, mid and proximal rectum. Approximately 1/2 circumfirential. Biopsies were taken with a cold forceps for histology. The exam was otherwise without abnormality on direct and retroflexion views.  Surgical pathology 5/12: Chronic colitis with focal activity. No granulomata, dysplasia, or malignancy identified.   MRI abdomen/pelvis 5/13: Intense rectal thickening and signs of perirectal enhancement and edema with complex fistulous network both above and below the pelvic floor. Rectal thickening extends above the APR but is predominantly below the APR. Findings are associated with small pockets of fluid, none appearing drainable. Differential considerations would include inflammatory bowel disease and neoplasm. Transmural process of both the rectum and likely in the anal canal. Inflammation extends to the penile crura and the base of the corpora spongiosum. Inflammation also suspected in the prostate, without definitive extension into the gland proper. Enhancing tract extending into the soft tissues of the RIGHT medial thigh towards the RIGHT inguinal region potentially from the gluteal cleft. Seen better on previous study. Multiple enlarged lymph nodes in the pelvis and along the RIGHT pelvic sidewall may be reactive or neoplastic. Numerous large  gallstones in the gallbladder lumen without evidence of cholecystitis or biliary ductal dilation. Numerous renal cysts some with hemorrhagic or proteinaceous features and some too small for characterization definitively. Compatible with renal cystic disease. Would consider medullary cystic disease and early autosomal dominant polycystic kidney disease. Ultimately if not yet obtained nephrology consultation may be warranted. Edema of the sacrum and coccyx suspicious for osteomyelitis with small fluid collection in this area and enhancement.  Discharge Instructions: Discharge Instructions    [facility] pharmacist to adjust dose for Vancomycin, Aminoglycosides and other anti-infective therapies as requested by physician.   Complete by: As directed    [facility] to provide Cath Flo 64m   Complete by: As directed    Administer for PICC line occlusion and as ordered by physician for other access device issues.   Anaphylaxis Kit: Provided to treat any anaphylactic reaction to the medication being provided to the patient if First Dose or when requested by physician   Complete by: As directed    Epinephrine 121mml  vial / amp: Administer 0.90m (0.384m subcutaneously once for moderate to severe anaphylaxis, nurse to call physician and pharmacy when reaction occurs and call 911 if needed for immediate care   Diphenhydramine 5019ml IV vial: Administer 25-64m65m/IM PRN for first dose reaction, rash, itching, mild reaction, nurse to call physician and pharmacy when reaction occurs   Sodium Chloride 0.9% NS 500ml5m Administer if needed for hypovolemic blood pressure drop or as ordered by physician after call to physician with anaphylactic reaction   Call MD for:  difficulty breathing, headache or visual disturbances   Complete by: As directed    Call MD for:  extreme fatigue   Complete by: As directed    Call MD for:  hives   Complete by: As directed    Call MD for:  persistant  dizziness or light-headedness   Complete by: As directed    Call MD for:  persistant nausea and vomiting   Complete by: As directed    Call MD for:  redness, tenderness, or signs of infection (pain, swelling, redness, odor or green/yellow discharge around incision site)   Complete by: As directed    Call MD for:  severe uncontrolled pain   Complete by: As directed    Call MD for:  temperature >100.4   Complete by: As directed    Change dressing on IV access line weekly and PRN   Complete by: As directed    Diet - low sodium heart healthy   Complete by: As directed    Discharge instructions   Complete by: As directed    Mr. BridgThorstensonm glad you are able to be discharged today! While you were here you were found to have inflammation throughout the pelvis as well as infection in your bone. The colonoscopy done by gastroenterology found some abnormal mucosa that also showed inflammation, no signs of cancer. Our imaging also showed multiple cysts on both of your kidneys. Thankfully, your kidney function appears normal. Please see the following notes:  For the bone infection, you will take: IV ceftriaxone daily for 6 weeks Metronidazole 500mg 39me daily for 6 weeks  You will also have multiple follow-up appointments you will need to make: Please make an appointment with your primary care doctor in the next week. You will also need to make an appointment with the surgeons, kidney doctors, gastroenterologists, and infectious disease doctors. That is understandably a lot of follow-up appointments, but we have provided the numbers for each of these to you.   We have also prescribed you pain medications.  It was a pleasure to meet you, Mr. BridgeGehretsh you the best and hope you stay happy and healthy!  Thank you, PhilliSanjuan Dame Flush IV access with Sodium Chloride 0.9% and Heparin 10 units/ml or 100 units/ml   Complete by: As directed    Home infusion instructions - [facility]   Complete by: As directed    Instructions: Flush IV access with Sodium Chloride 0.9% and Heparin 10units/ml or 100units/ml   Change dressing on IV access line: Weekly and PRN   Instructions Cath Flo 2mg: A6mnister for PICC Line occlusion and as ordered by physician for other access device   [facility] pharmacist to adjust dose for: Vancomycin, Aminoglycosides and other anti-infective therapies as requested by physician   Increase activity slowly   Complete by: As directed    Method of administration may be changed at the discretion of home infusion pharmacist  based upon assessment of the patient and/or caregiver's ability to self-administer the medication ordered   Complete by: As directed      Signed: Sanjuan Dame, MD 01/03/2021, 11:16 AM   Pager: [phone]

## 2021-01-02 NOTE — Progress Notes (Addendum)
[facility]    Date of Admission:  12/30/2020   Total days of antibiotics 3/ceftriaxone plus metronidazole  ID: Shawn [name] is a 35 y.o. male with  Active Problems:   Perirectal abscess   Abnormal CT scan, colon   Rectal pain    Subjective: Afebrile. 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 enoxaparin (LOVENOX) injection  40 mg Subcutaneous Q24H  . senna  1 tablet Oral BID  . vitamin B-12  1,000 mcg Oral Daily    Objective: Vital signs in last 24 hours: Temp:  [97.9 F (36.6 C)-99 F (37.2 C)] 98.9 F (37.2 C) (05/13 0450) Pulse Rate:  [67-92] 81 (05/13 0450) Resp:  [14-18] 18 (05/13 0450) BP: (116-152)/(56-88) 116/88 (05/13 0450) SpO2:  [98 %-100 %] 98 % (05/13 0450) Weight:  [88.5 kg] 88.5 kg (05/13 0450)  Physical Exam  Constitutional: He is oriented to person, place, and time. He appears well-developed and well-nourished. No distress.  HENT:  Mouth/Throat: Oropharynx is clear and moist. No oropharyngeal exudate.  Cardiovascular: Normal rate, regular rhythm and normal heart sounds. Exam reveals no gallop and no friction rub.  No murmur heard.  Pulmonary/Chest: Effort normal and breath sounds normal. No respiratory distress. He has no wheezes.  Abdominal: Soft. Bowel sounds are normal. He exhibits no distension. There is no tenderness.  Lymphadenopathy:  He has no cervical adenopathy.  Neurological: He is alert and oriented to person, place, and time.  Skin: Skin is warm and dry. No rash noted. No erythema.  Psychiatric: He has a normal mood and affect. His behavior is normal.     Lab Results Recent Labs    01/01/21 0440 01/02/21 0155  WBC 14.4* 12.5*  HGB 9.4* 8.9*  HCT 27.7* 26.1*  NA 139 136  K 4.2 3.7  CL 111 110  CO2  17* 18*  BUN 11 12  CREATININE 1.34* 1.35*   Liver Panel Recent Labs    12/31/20 0039  PROT 6.6  ALBUMIN 2.9*  AST 14*  ALT 14  ALKPHOS 78  BILITOT 0.2*   Sedimentation Rate Recent Labs    12/31/20 0039  ESRSEDRATE 78*   C-Reactive Protein Recent Labs    12/31/20 0039  CRP 5.4*    Microbiology: 5/11 blood cx ngtd Studies/Results: MR PELVIS W WO CONTRAST  Result Date: 01/02/2021 CLINICAL DATA:  Renal rectal abscess, fever and drainage from rectum in a 35 year old male. EXAM: MRI ABDOMEN AND PELVIS WITHOUT AND WITH CONTRAST TECHNIQUE: Multiplanar multisequence MR imaging of the abdomen and pelvis was performed both before and after the administration of intravenous contrast. CONTRAST:  56m GADAVIST GADOBUTROL 1 MMOL/ML IV SOLN COMPARISON:  CT of the pelvis from April of 2022 and CT of the abdomen and pelvis from June 04, 2020. FINDINGS: COMBINED FINDINGS FOR BOTH MR ABDOMEN AND PELVIS Lower chest: No effusion. No consolidation. Limited assessment of the lung bases on MRI. Hepatobiliary: No focal, suspicious hepatic lesion. Portal vein is patent. No pericholecystic stranding but with numerous large gallstones in the gallbladder lumen. The biliary tree is nondilated. Pancreas: Normal intrinsic T1 signal. No ductal dilation or sign of inflammation. No focal lesion. Spleen:  Spleen normal size and contour. Adrenals/Urinary Tract: Adrenal glands are normal. Kidneys are normal size but with numerous cysts throughout the kidneys both in the  cortical and corticomedullary portions of the kidneys. No hydronephrosis. No perinephric stranding. Many of the cysts are tiny on the order of 1-2 mm, most between 3 mm and 1 cm. Many of these show marked intrinsic T1 hyperintensity compatible with hemorrhagic or proteinaceous cysts. Some are too small for definitive characterization. Stomach/Bowel: Proximal portion of the bowel without signs of obstruction or acute process. The appendix not clearly  visualized though no stranding in the area to suggest acute process. Pelvic portion of the exam is focused on the area of the rectum, per fistula/rectal protocol of the pelvis therefore bowel in the mid abdomen shows incomplete assessment. There is intense rectal thickening and perirectal stranding. Extensive presacral edema and trace amount of fluid in the presacral space. Multiple enlarged lymph nodes in the mesorectum greater than 6. For instance on image 15 of series 9 there is a 6 mm LEFT mesorectal lymph node with similar size lymph nodes scattered throughout the mesorectum. Along the course of the superior rectal vein on image 11 of series 9 there is a 7 mm lymph node. Smaller nodes track along the superior rectal vein to the lower abdomen. Stranding and inflammation is seen at the level of the pelvic floor including near the crus of the LEFT corpora and near the base of the penis at the pelvic floor. There is a small tract that extends anteriorly from the sphincter complex, upper margin towards the base of the penis/penile crura and corpora spongiosum best seen on images 32 and 33 of series 9. Tiny pocket of fluid in the LEFT anterior ischial rectal fat (image 31/) 9 8 x 6 mm. Another small tract extending from the LEFT lateral margin of the sphincter complex towards the LEFT ischial rectal fossa and LEFT obturator muscle (image 33, 32 and 31 of series 9 at the 3 o'clock position. At the upper margin of the sphincter complex at the pelvic floor penetrating the pubococcygeal muscle potentially is another suspected tract that may originate from the mid portion of the anal canal/sphincter complex (image 35 tracking cephalad to a small collection on image 33 of series 9.) Intense inflammation tracks along the levator musculature at the lower pelvic floor towards the RIGHT and LEFT pelvic sidewall and sacrum and coccyx. Extensive inflammation about the coccyx. Increased T2 signal within the lower sacrum and  coccyx. Just above the LEFT levator musculature is inflammation extending from the lower rectum along the upper margin of the sphincter complex best seen on image 28 of series 20. No drainable fluid or discrete collection. This may ultimately penetrate the LEFT levator muscle on image 24 of series 20. This appears to originate from a transmural process (defect in the posterolateral rectal wall along the LEFT posterolateral rectum along the LEFT lateral rectum extending towards the pelvic floor a. Small fistulous tract also along the RIGHT anterior sphincter complex tracking towards the penile corpora and bulbous spongiosum, edema tracking along the RIGHT lateral rectum just above the pelvic floor on the sagittal view, image 30 of series 7. Intense inflammation also tracking through the intersphincteric plane about the anus. Inflammation also of the prostate is suspected without definitive extension into the gland proper. Inflammation tracks into the RIGHT mid thigh in anteriorly towards the inguinal canal with fluid containing tract in this region as well tracking potentially from inflammation extending from the gluteal cleft. This is better seen on previous imaging there is stranding in the bilateral gluteal cleft as well posteriorly better seen on full field of  view images obtained on a prior exam. Marked rectal thickening is noted mainly below the level of the anterior peritoneal reflection. Vascular/Lymphatic: Vascular structures in the pelvis are not well assessed. Lymph nodes about the mesorectum and just outside the mesorectum along the RIGHT pelvic sidewall with 9 mm lymph node adjacent to the obturator muscle on the RIGHT (image 23/8). Smaller lymph nodes along the RIGHT and LEFT iliac change. There is no gastrohepatic or hepatoduodenal ligament lymphadenopathy. No retroperitoneal or mesenteric lymphadenopathy. Reproductive: Suspected prostate inflammation/prostatitis, potentially secondary. Urinary bladder  with smooth contours. Other:  None Musculoskeletal: Edema of sacrum and coccyx. Fluid about the sacrococcygeal region as well measuring approximately 12 x 11 mm. (Image 20/6 of the pelvic portion of the exam. Otherwise no acute musculoskeletal process. IMPRESSION: 1. Intense rectal thickening and signs of perirectal enhancement and edema with complex fistulous network both above and below the pelvic floor. Rectal thickening extends above the APR but is predominantly below the APR. Findings are associated with small pockets of fluid, none appearing drainable. Differential considerations would include inflammatory bowel disease and neoplasm. 2. Transmural process of both the rectum and likely in the anal canal. 3. Inflammation extends to the penile crura and the base of the corpora spongiosum. 4. Inflammation also suspected in the prostate, without definitive extension into the gland proper. 5. Enhancing tract extending into the soft tissues of the RIGHT medial thigh towards the RIGHT inguinal region potentially from the gluteal cleft. Seen better on previous study. 6. Multiple enlarged lymph nodes in the pelvis and along the RIGHT pelvic sidewall may be reactive or neoplastic. 7. Numerous large gallstones in the gallbladder lumen without evidence of cholecystitis or biliary ductal dilation. 8. Numerous renal cysts some with hemorrhagic or proteinaceous features and some too small for characterization definitively. Compatible with renal cystic disease. Would consider medullary cystic disease and early autosomal dominant polycystic kidney disease. Ultimately if not yet obtained nephrology consultation may be warranted. 9. Edema of the sacrum and coccyx suspicious for osteomyelitis with small fluid collection in this area and enhancement. Electronically Signed   By: Zetta Bills M.D.   On: 01/02/2021 08:36   MR ABDOMEN W WO CONTRAST  Result Date: 01/02/2021 CLINICAL DATA:  Renal rectal abscess, fever and drainage  from rectum in a 35 year old male. EXAM: MRI ABDOMEN AND PELVIS WITHOUT AND WITH CONTRAST TECHNIQUE: Multiplanar multisequence MR imaging of the abdomen and pelvis was performed both before and after the administration of intravenous contrast. CONTRAST:  57m GADAVIST GADOBUTROL 1 MMOL/ML IV SOLN COMPARISON:  CT of the pelvis from April of 2022 and CT of the abdomen and pelvis from June 04, 2020. FINDINGS: COMBINED FINDINGS FOR BOTH MR ABDOMEN AND PELVIS Lower chest: No effusion. No consolidation. Limited assessment of the lung bases on MRI. Hepatobiliary: No focal, suspicious hepatic lesion. Portal vein is patent. No pericholecystic stranding but with numerous large gallstones in the gallbladder lumen. The biliary tree is nondilated. Pancreas: Normal intrinsic T1 signal. No ductal dilation or sign of inflammation. No focal lesion. Spleen:  Spleen normal size and contour. Adrenals/Urinary Tract: Adrenal glands are normal. Kidneys are normal size but with numerous cysts throughout the kidneys both in the cortical and corticomedullary portions of the kidneys. No hydronephrosis. No perinephric stranding. Many of the cysts are tiny on the order of 1-2 mm, most between 3 mm and 1 cm. Many of these show marked intrinsic T1 hyperintensity compatible with hemorrhagic or proteinaceous cysts. Some are too small for definitive characterization. Stomach/Bowel:  Proximal portion of the bowel without signs of obstruction or acute process. The appendix not clearly visualized though no stranding in the area to suggest acute process. Pelvic portion of the exam is focused on the area of the rectum, per fistula/rectal protocol of the pelvis therefore bowel in the mid abdomen shows incomplete assessment. There is intense rectal thickening and perirectal stranding. Extensive presacral edema and trace amount of fluid in the presacral space. Multiple enlarged lymph nodes in the mesorectum greater than 6. For instance on image 15 of  series 9 there is a 6 mm LEFT mesorectal lymph node with similar size lymph nodes scattered throughout the mesorectum. Along the course of the superior rectal vein on image 11 of series 9 there is a 7 mm lymph node. Smaller nodes track along the superior rectal vein to the lower abdomen. Stranding and inflammation is seen at the level of the pelvic floor including near the crus of the LEFT corpora and near the base of the penis at the pelvic floor. There is a small tract that extends anteriorly from the sphincter complex, upper margin towards the base of the penis/penile crura and corpora spongiosum best seen on images 32 and 33 of series 9. Tiny pocket of fluid in the LEFT anterior ischial rectal fat (image 31/) 9 8 x 6 mm. Another small tract extending from the LEFT lateral margin of the sphincter complex towards the LEFT ischial rectal fossa and LEFT obturator muscle (image 33, 32 and 31 of series 9 at the 3 o'clock position. At the upper margin of the sphincter complex at the pelvic floor penetrating the pubococcygeal muscle potentially is another suspected tract that may originate from the mid portion of the anal canal/sphincter complex (image 35 tracking cephalad to a small collection on image 33 of series 9.) Intense inflammation tracks along the levator musculature at the lower pelvic floor towards the RIGHT and LEFT pelvic sidewall and sacrum and coccyx. Extensive inflammation about the coccyx. Increased T2 signal within the lower sacrum and coccyx. Just above the LEFT levator musculature is inflammation extending from the lower rectum along the upper margin of the sphincter complex best seen on image 28 of series 20. No drainable fluid or discrete collection. This may ultimately penetrate the LEFT levator muscle on image 24 of series 20. This appears to originate from a transmural process (defect in the posterolateral rectal wall along the LEFT posterolateral rectum along the LEFT lateral rectum extending  towards the pelvic floor a. Small fistulous tract also along the RIGHT anterior sphincter complex tracking towards the penile corpora and bulbous spongiosum, edema tracking along the RIGHT lateral rectum just above the pelvic floor on the sagittal view, image 30 of series 7. Intense inflammation also tracking through the intersphincteric plane about the anus. Inflammation also of the prostate is suspected without definitive extension into the gland proper. Inflammation tracks into the RIGHT mid thigh in anteriorly towards the inguinal canal with fluid containing tract in this region as well tracking potentially from inflammation extending from the gluteal cleft. This is better seen on previous imaging there is stranding in the bilateral gluteal cleft as well posteriorly better seen on full field of view images obtained on a prior exam. Marked rectal thickening is noted mainly below the level of the anterior peritoneal reflection. Vascular/Lymphatic: Vascular structures in the pelvis are not well assessed. Lymph nodes about the mesorectum and just outside the mesorectum along the RIGHT pelvic sidewall with 9 mm lymph node adjacent to  the obturator muscle on the RIGHT (image 23/8). Smaller lymph nodes along the RIGHT and LEFT iliac change. There is no gastrohepatic or hepatoduodenal ligament lymphadenopathy. No retroperitoneal or mesenteric lymphadenopathy. Reproductive: Suspected prostate inflammation/prostatitis, potentially secondary. Urinary bladder with smooth contours. Other:  None Musculoskeletal: Edema of sacrum and coccyx. Fluid about the sacrococcygeal region as well measuring approximately 12 x 11 mm. (Image 20/6 of the pelvic portion of the exam. Otherwise no acute musculoskeletal process. IMPRESSION: 1. Intense rectal thickening and signs of perirectal enhancement and edema with complex fistulous network both above and below the pelvic floor. Rectal thickening extends above the APR but is predominantly  below the APR. Findings are associated with small pockets of fluid, none appearing drainable. Differential considerations would include inflammatory bowel disease and neoplasm. 2. Transmural process of both the rectum and likely in the anal canal. 3. Inflammation extends to the penile crura and the base of the corpora spongiosum. 4. Inflammation also suspected in the prostate, without definitive extension into the gland proper. 5. Enhancing tract extending into the soft tissues of the RIGHT medial thigh towards the RIGHT inguinal region potentially from the gluteal cleft. Seen better on previous study. 6. Multiple enlarged lymph nodes in the pelvis and along the RIGHT pelvic sidewall may be reactive or neoplastic. 7. Numerous large gallstones in the gallbladder lumen without evidence of cholecystitis or biliary ductal dilation. 8. Numerous renal cysts some with hemorrhagic or proteinaceous features and some too small for characterization definitively. Compatible with renal cystic disease. Would consider medullary cystic disease and early autosomal dominant polycystic kidney disease. Ultimately if not yet obtained nephrology consultation may be warranted. 9. Edema of the sacrum and coccyx suspicious for osteomyelitis with small fluid collection in this area and enhancement. Electronically Signed   By: Zetta Bills M.D.   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 Plan to get picc line. Recommend to see if general surgery feels that any surgical intervention needed. After 6wk of iv therapy, will transition to amox/clav for additional 6-8wk pending improvement.  Rectal inflammation = awaiting path results to see if c/w IBD- to warrant further treatment.  Will see back on Monday. Will establish follow up appt if discharged over  the weekend.  [facility] Cell: [phone]: [phone]  01/02/2021, 11:28 AM

## 2021-01-03 LAB — CBC
HCT: 26.4 % — ABNORMAL LOW (ref 39.0–52.0)
Hemoglobin: 9 g/dL — ABNORMAL LOW (ref 13.0–17.0)
MCH: 34.9 pg — ABNORMAL HIGH (ref 26.0–34.0)
MCHC: 34.1 g/dL (ref 30.0–36.0)
MCV: 102.3 fL — ABNORMAL HIGH (ref 80.0–100.0)
Platelets: 357 10*3/uL (ref 150–400)
RBC: 2.58 MIL/uL — ABNORMAL LOW (ref 4.22–5.81)
RDW: 17.2 % — ABNORMAL HIGH (ref 11.5–15.5)
WBC: 10.4 10*3/uL (ref 4.0–10.5)
nRBC: 0 % (ref 0.0–0.2)

## 2021-01-03 LAB — BASIC METABOLIC PANEL
Anion gap: 9 (ref 5–15)
BUN: 14 mg/dL (ref 6–20)
CO2: 19 mmol/L — ABNORMAL LOW (ref 22–32)
Calcium: 8.4 mg/dL — ABNORMAL LOW (ref 8.9–10.3)
Chloride: 110 mmol/L (ref 98–111)
Creatinine, Ser: 1.27 mg/dL — ABNORMAL HIGH (ref 0.61–1.24)
GFR, Estimated: >60 mL/min (ref >60)
Glucose, Bld: 94 mg/dL (ref 70–99)
Potassium: 3.9 mmol/L (ref 3.5–5.1)
Sodium: 138 mmol/L (ref 135–145)

## 2021-01-03 MED ORDER — HEPARIN SOD (PORK) LOCK FLUSH 100 UNIT/ML IV SOLN
250.0000 [IU] | INTRAVENOUS | Status: DC | PRN
  Filled 2021-01-03: qty 2.5

## 2021-01-03 MED ORDER — HEPARIN SOD (PORK) LOCK FLUSH 100 UNIT/ML IV SOLN
250.0000 [IU] | Freq: Every day | INTRAVENOUS | Status: DC
  Administered 2021-01-03: 250 [IU]
  Filled 2021-01-03: qty 2.5

## 2021-01-03 MED ORDER — SODIUM CHLORIDE 0.9% FLUSH: 10.0000 mL | INTRAVENOUS | Status: DC | PRN

## 2021-01-03 MED ORDER — CHLORHEXIDINE GLUCONATE CLOTH 2 % EX PADS
6.0000 | MEDICATED_PAD | Freq: Every day | CUTANEOUS | Status: DC
  Administered 2021-01-03: 6 via TOPICAL

## 2021-01-03 NOTE — Progress Notes (Signed)
Alexis RN aware to remove PIV and change IV tubing.

## 2021-01-03 NOTE — Progress Notes (Signed)
Spoke with MD via telephone.  Plan on PICC placement this AM.

## 2021-01-03 NOTE — TOC Transition Note (Signed)
Transition of Care [facility]) - CM/SW Discharge Note   Patient Details  Name: Shawn [name] MRN: [id number] Date of Birth: 1986-01-11  [facility] CM/SW Contact:  Bartholomew Crews, RN Phone Number: [phone] 01/03/2021, 9:58 AM   Clinical Narrative:     Spoke with patient at the bedside to discuss transition home. Patient requesting assistance with transportation home. Patient verbalized no difficulty with getting in/out of vehicles or being able to get to door from vehicle. Discussed rider waiver for [facility] transportation - signed and faxed. Nursing to advise CM when patient ready to discharge.   Verified [facility] to follow up with patient tomorrow for first home dose of IV antibiotic. Nursing to give today's dose prior to discharge.   Verified [facility] to provide IV antibiotics. Medication to be delivered to the home tonight.   Final next level of care: [place] Barriers to Discharge: No Barriers Identified   Patient Goals and CMS Choice Patient states their goals for this hospitalization and ongoing recovery are:: return home CMS Medicare.gov Compare Post Acute Care list provided to:: Patient Choice offered to / list presented to : Patient  Discharge Placement                       Discharge Plan and Services   Discharge Planning Services: CM Consult Post Acute Care Choice: Home Health          DME Arranged: N/A DME Agency: NA       HH Arranged: RN,IV Antibiotics HH Agency: [place] Date [facility] Agency Contacted: 01/03/21 Time [place]: [phone] Representative spoke with at [place]: Tommi Rumps with [place]; Pam with [facility]  Social Determinants of Health (SDOH) Interventions     Readmission Risk Interventions No flowsheet data found.

## 2021-01-03 NOTE — Hospital Course (Signed)
Notes some coccygeal pain this morning with some relief from the pain medications No nausea/vomiting, did have bowel movement overnight, no blood. Question regarding renal cysts. Recommended for outpatient follow up.

## 2021-01-03 NOTE — Progress Notes (Signed)
Peripherally Inserted Central Catheter Placement  The IV Nurse has discussed with the patient and/or persons authorized to consent for the patient, the purpose of this procedure and the potential benefits and risks involved with this procedure.  The benefits include less needle sticks, lab draws from the catheter, and the patient may be discharged home with the catheter. Risks include, but not limited to, infection, bleeding, blood clot (thrombus formation), and puncture of an artery; nerve damage and irregular heartbeat and possibility to perform a PICC exchange if needed/ordered by physician.  Alternatives to this procedure were also discussed.  B[facility] Power PICC patient education guide, fact sheet on infection prevention and patient information card has been provided to patient /or left at bedside.    PICC Placement Documentation  PICC Single Lumen 60/60/04 Right Basilic 41 cm 0 cm (Active)  Indication for Insertion or Continuance of Line Home intravenous therapies (PICC only) 01/03/21 0859  Exposed Catheter (cm) 0 cm 01/03/21 0859  Site Assessment Clean;Dry;Intact 01/03/21 0859  Line Status [place] locked;Blood return noted;Flushed 01/03/21 0859  Dressing Type Transparent 01/03/21 0859  Dressing Status Clean;Dry;Intact 01/03/21 0859  Antimicrobial disc in place? Yes 01/03/21 0859  Safety Lock Not Applicable 59/97/74 1423  Line Care Connections checked and tightened 01/03/21 0859  Line Adjustment (NICU/IV Team Only) No 01/03/21 0859  Dressing Intervention New dressing 01/03/21 0859  Dressing Change Due 01/10/21 01/03/21 0859       Shawn [name] 01/03/2021, 9:00 AM

## 2021-01-03 NOTE — Social Work (Signed)
CSW confirmed pt's address and provided a taxi voucher due to patient not having transport home.

## 2021-01-03 NOTE — Progress Notes (Signed)
Progress Note for [facility]  Subjective: No complaints this AM.  Objective: Vital signs in last 24 hours: Temp:  [98.1 F (36.7 C)-98.4 F (36.9 C)] 98.1 F (36.7 C) (05/14 0437) Pulse Rate:  [52-62] 52 (05/14 0437) Resp:  [17-18] 17 (05/14 0437) BP: (116-138)/(60-81) 138/60 (05/14 0437) SpO2:  [98 %-100 %] 98 % (05/14 0437) Last BM Date: 01/01/21  Intake/Output from previous day: 05/13 0701 - 05/14 0700 In: 1800.7 [P.O.:1000; IV Piggyback:800.7] Out: 700 [Urine:700] Intake/Output this shift: No intake/output data recorded.  General appearance: alert and no distress GI: soft, non-tender; bowel sounds normal; no masses,  no organomegaly  Lab Results: Recent Labs    01/01/21 0440 01/02/21 0155 01/03/21 0129  WBC 14.4* 12.5* 10.4  HGB 9.4* 8.9* 9.0*  HCT 27.7* 26.1* 26.4*  PLT 345 362 357   BMET Recent Labs    01/01/21 0440 01/02/21 0155 01/03/21 0129  NA 139 136 138  K 4.2 3.7 3.9  CL 111 110 110  CO2 17* 18* 19*  GLUCOSE 111* 93 94  BUN 11 12 14   CREATININE 1.34* 1.35* 1.27*  CALCIUM 8.7* 8.2* 8.4*   LFT No results for input(s): PROT, ALBUMIN, AST, ALT, ALKPHOS, BILITOT, BILIDIR, IBILI in the last 72 hours. PT/INR No results for input(s): LABPROT, INR in the last 72 hours. Hepatitis Panel Recent Labs    01/02/21 1004  HEPBSAG NON REACTIVE   C-Diff No results for input(s): CDIFFTOX in the last 72 hours. Fecal Lactopherrin No results for input(s): FECLLACTOFRN in the last 72 hours.  Studies/Results: MR PELVIS W WO CONTRAST  Result Date: 01/02/2021 CLINICAL DATA:  Renal rectal abscess, fever and drainage from rectum in a 35 year old male. EXAM: MRI ABDOMEN AND PELVIS WITHOUT AND WITH CONTRAST TECHNIQUE: Multiplanar multisequence MR imaging of the abdomen and pelvis was performed both before and after the administration of intravenous contrast. CONTRAST:  30m GADAVIST GADOBUTROL 1 MMOL/ML IV SOLN COMPARISON:  CT of the pelvis from April of 2022 and  CT of the abdomen and pelvis from June 04, 2020. FINDINGS: COMBINED FINDINGS FOR BOTH MR ABDOMEN AND PELVIS Lower chest: No effusion. No consolidation. Limited assessment of the lung bases on MRI. Hepatobiliary: No focal, suspicious hepatic lesion. Portal vein is patent. No pericholecystic stranding but with numerous large gallstones in the gallbladder lumen. The biliary tree is nondilated. Pancreas: Normal intrinsic T1 signal. No ductal dilation or sign of inflammation. No focal lesion. Spleen:  Spleen normal size and contour. Adrenals/Urinary Tract: Adrenal glands are normal. Kidneys are normal size but with numerous cysts throughout the kidneys both in the cortical and corticomedullary portions of the kidneys. No hydronephrosis. No perinephric stranding. Many of the cysts are tiny on the order of 1-2 mm, most between 3 mm and 1 cm. Many of these show marked intrinsic T1 hyperintensity compatible with hemorrhagic or proteinaceous cysts. Some are too small for definitive characterization. Stomach/Bowel: Proximal portion of the bowel without signs of obstruction or acute process. The appendix not clearly visualized though no stranding in the area to suggest acute process. Pelvic portion of the exam is focused on the area of the rectum, per fistula/rectal protocol of the pelvis therefore bowel in the mid abdomen shows incomplete assessment. There is intense rectal thickening and perirectal stranding. Extensive presacral edema and trace amount of fluid in the presacral space. Multiple enlarged lymph nodes in the mesorectum greater than 6. For instance on image 15 of series 9 there is a 6 mm LEFT mesorectal lymph node  with similar size lymph nodes scattered throughout the mesorectum. Along the course of the superior rectal vein on image 11 of series 9 there is a 7 mm lymph node. Smaller nodes track along the superior rectal vein to the lower abdomen. Stranding and inflammation is seen at the level of the pelvic  floor including near the crus of the LEFT corpora and near the base of the penis at the pelvic floor. There is a small tract that extends anteriorly from the sphincter complex, upper margin towards the base of the penis/penile crura and corpora spongiosum best seen on images 32 and 33 of series 9. Tiny pocket of fluid in the LEFT anterior ischial rectal fat (image 31/) 9 8 x 6 mm. Another small tract extending from the LEFT lateral margin of the sphincter complex towards the LEFT ischial rectal fossa and LEFT obturator muscle (image 33, 32 and 31 of series 9 at the 3 o'clock position. At the upper margin of the sphincter complex at the pelvic floor penetrating the pubococcygeal muscle potentially is another suspected tract that may originate from the mid portion of the anal canal/sphincter complex (image 35 tracking cephalad to a small collection on image 33 of series 9.) Intense inflammation tracks along the levator musculature at the lower pelvic floor towards the RIGHT and LEFT pelvic sidewall and sacrum and coccyx. Extensive inflammation about the coccyx. Increased T2 signal within the lower sacrum and coccyx. Just above the LEFT levator musculature is inflammation extending from the lower rectum along the upper margin of the sphincter complex best seen on image 28 of series 20. No drainable fluid or discrete collection. This may ultimately penetrate the LEFT levator muscle on image 24 of series 20. This appears to originate from a transmural process (defect in the posterolateral rectal wall along the LEFT posterolateral rectum along the LEFT lateral rectum extending towards the pelvic floor a. Small fistulous tract also along the RIGHT anterior sphincter complex tracking towards the penile corpora and bulbous spongiosum, edema tracking along the RIGHT lateral rectum just above the pelvic floor on the sagittal view, image 30 of series 7. Intense inflammation also tracking through the intersphincteric plane about  the anus. Inflammation also of the prostate is suspected without definitive extension into the gland proper. Inflammation tracks into the RIGHT mid thigh in anteriorly towards the inguinal canal with fluid containing tract in this region as well tracking potentially from inflammation extending from the gluteal cleft. This is better seen on previous imaging there is stranding in the bilateral gluteal cleft as well posteriorly better seen on full field of view images obtained on a prior exam. Marked rectal thickening is noted mainly below the level of the anterior peritoneal reflection. Vascular/Lymphatic: Vascular structures in the pelvis are not well assessed. Lymph nodes about the mesorectum and just outside the mesorectum along the RIGHT pelvic sidewall with 9 mm lymph node adjacent to the obturator muscle on the RIGHT (image 23/8). Smaller lymph nodes along the RIGHT and LEFT iliac change. There is no gastrohepatic or hepatoduodenal ligament lymphadenopathy. No retroperitoneal or mesenteric lymphadenopathy. Reproductive: Suspected prostate inflammation/prostatitis, potentially secondary. Urinary bladder with smooth contours. Other:  None Musculoskeletal: Edema of sacrum and coccyx. Fluid about the sacrococcygeal region as well measuring approximately 12 x 11 mm. (Image 20/6 of the pelvic portion of the exam. Otherwise no acute musculoskeletal process. IMPRESSION: 1. Intense rectal thickening and signs of perirectal enhancement and edema with complex fistulous network both above and below the pelvic floor. Rectal  thickening extends above the APR but is predominantly below the APR. Findings are associated with small pockets of fluid, none appearing drainable. Differential considerations would include inflammatory bowel disease and neoplasm. 2. Transmural process of both the rectum and likely in the anal canal. 3. Inflammation extends to the penile crura and the base of the corpora spongiosum. 4. Inflammation also  suspected in the prostate, without definitive extension into the gland proper. 5. Enhancing tract extending into the soft tissues of the RIGHT medial thigh towards the RIGHT inguinal region potentially from the gluteal cleft. Seen better on previous study. 6. Multiple enlarged lymph nodes in the pelvis and along the RIGHT pelvic sidewall may be reactive or neoplastic. 7. Numerous large gallstones in the gallbladder lumen without evidence of cholecystitis or biliary ductal dilation. 8. Numerous renal cysts some with hemorrhagic or proteinaceous features and some too small for characterization definitively. Compatible with renal cystic disease. Would consider medullary cystic disease and early autosomal dominant polycystic kidney disease. Ultimately if not yet obtained nephrology consultation may be warranted. 9. Edema of the sacrum and coccyx suspicious for osteomyelitis with small fluid collection in this area and enhancement. Electronically Signed   By: Zetta Bills M.D.   On: 01/02/2021 08:36   MR ABDOMEN W WO CONTRAST  Result Date: 01/02/2021 CLINICAL DATA:  Renal rectal abscess, fever and drainage from rectum in a 35 year old male. EXAM: MRI ABDOMEN AND PELVIS WITHOUT AND WITH CONTRAST TECHNIQUE: Multiplanar multisequence MR imaging of the abdomen and pelvis was performed both before and after the administration of intravenous contrast. CONTRAST:  80m GADAVIST GADOBUTROL 1 MMOL/ML IV SOLN COMPARISON:  CT of the pelvis from April of 2022 and CT of the abdomen and pelvis from June 04, 2020. FINDINGS: COMBINED FINDINGS FOR BOTH MR ABDOMEN AND PELVIS Lower chest: No effusion. No consolidation. Limited assessment of the lung bases on MRI. Hepatobiliary: No focal, suspicious hepatic lesion. Portal vein is patent. No pericholecystic stranding but with numerous large gallstones in the gallbladder lumen. The biliary tree is nondilated. Pancreas: Normal intrinsic T1 signal. No ductal dilation or sign of  inflammation. No focal lesion. Spleen:  Spleen normal size and contour. Adrenals/Urinary Tract: Adrenal glands are normal. Kidneys are normal size but with numerous cysts throughout the kidneys both in the cortical and corticomedullary portions of the kidneys. No hydronephrosis. No perinephric stranding. Many of the cysts are tiny on the order of 1-2 mm, most between 3 mm and 1 cm. Many of these show marked intrinsic T1 hyperintensity compatible with hemorrhagic or proteinaceous cysts. Some are too small for definitive characterization. Stomach/Bowel: Proximal portion of the bowel without signs of obstruction or acute process. The appendix not clearly visualized though no stranding in the area to suggest acute process. Pelvic portion of the exam is focused on the area of the rectum, per fistula/rectal protocol of the pelvis therefore bowel in the mid abdomen shows incomplete assessment. There is intense rectal thickening and perirectal stranding. Extensive presacral edema and trace amount of fluid in the presacral space. Multiple enlarged lymph nodes in the mesorectum greater than 6. For instance on image 15 of series 9 there is a 6 mm LEFT mesorectal lymph node with similar size lymph nodes scattered throughout the mesorectum. Along the course of the superior rectal vein on image 11 of series 9 there is a 7 mm lymph node. Smaller nodes track along the superior rectal vein to the lower abdomen. Stranding and inflammation is seen at the level of the pelvic  floor including near the crus of the LEFT corpora and near the base of the penis at the pelvic floor. There is a small tract that extends anteriorly from the sphincter complex, upper margin towards the base of the penis/penile crura and corpora spongiosum best seen on images 32 and 33 of series 9. Tiny pocket of fluid in the LEFT anterior ischial rectal fat (image 31/) 9 8 x 6 mm. Another small tract extending from the LEFT lateral margin of the sphincter complex  towards the LEFT ischial rectal fossa and LEFT obturator muscle (image 33, 32 and 31 of series 9 at the 3 o'clock position. At the upper margin of the sphincter complex at the pelvic floor penetrating the pubococcygeal muscle potentially is another suspected tract that may originate from the mid portion of the anal canal/sphincter complex (image 35 tracking cephalad to a small collection on image 33 of series 9.) Intense inflammation tracks along the levator musculature at the lower pelvic floor towards the RIGHT and LEFT pelvic sidewall and sacrum and coccyx. Extensive inflammation about the coccyx. Increased T2 signal within the lower sacrum and coccyx. Just above the LEFT levator musculature is inflammation extending from the lower rectum along the upper margin of the sphincter complex best seen on image 28 of series 20. No drainable fluid or discrete collection. This may ultimately penetrate the LEFT levator muscle on image 24 of series 20. This appears to originate from a transmural process (defect in the posterolateral rectal wall along the LEFT posterolateral rectum along the LEFT lateral rectum extending towards the pelvic floor a. Small fistulous tract also along the RIGHT anterior sphincter complex tracking towards the penile corpora and bulbous spongiosum, edema tracking along the RIGHT lateral rectum just above the pelvic floor on the sagittal view, image 30 of series 7. Intense inflammation also tracking through the intersphincteric plane about the anus. Inflammation also of the prostate is suspected without definitive extension into the gland proper. Inflammation tracks into the RIGHT mid thigh in anteriorly towards the inguinal canal with fluid containing tract in this region as well tracking potentially from inflammation extending from the gluteal cleft. This is better seen on previous imaging there is stranding in the bilateral gluteal cleft as well posteriorly better seen on full field of view  images obtained on a prior exam. Marked rectal thickening is noted mainly below the level of the anterior peritoneal reflection. Vascular/Lymphatic: Vascular structures in the pelvis are not well assessed. Lymph nodes about the mesorectum and just outside the mesorectum along the RIGHT pelvic sidewall with 9 mm lymph node adjacent to the obturator muscle on the RIGHT (image 23/8). Smaller lymph nodes along the RIGHT and LEFT iliac change. There is no gastrohepatic or hepatoduodenal ligament lymphadenopathy. No retroperitoneal or mesenteric lymphadenopathy. Reproductive: Suspected prostate inflammation/prostatitis, potentially secondary. Urinary bladder with smooth contours. Other:  None Musculoskeletal: Edema of sacrum and coccyx. Fluid about the sacrococcygeal region as well measuring approximately 12 x 11 mm. (Image 20/6 of the pelvic portion of the exam. Otherwise no acute musculoskeletal process. IMPRESSION: 1. Intense rectal thickening and signs of perirectal enhancement and edema with complex fistulous network both above and below the pelvic floor. Rectal thickening extends above the APR but is predominantly below the APR. Findings are associated with small pockets of fluid, none appearing drainable. Differential considerations would include inflammatory bowel disease and neoplasm. 2. Transmural process of both the rectum and likely in the anal canal. 3. Inflammation extends to the penile crura and the  base of the corpora spongiosum. 4. Inflammation also suspected in the prostate, without definitive extension into the gland proper. 5. Enhancing tract extending into the soft tissues of the RIGHT medial thigh towards the RIGHT inguinal region potentially from the gluteal cleft. Seen better on previous study. 6. Multiple enlarged lymph nodes in the pelvis and along the RIGHT pelvic sidewall may be reactive or neoplastic. 7. Numerous large gallstones in the gallbladder lumen without evidence of cholecystitis or  biliary ductal dilation. 8. Numerous renal cysts some with hemorrhagic or proteinaceous features and some too small for characterization definitively. Compatible with renal cystic disease. Would consider medullary cystic disease and early autosomal dominant polycystic kidney disease. Ultimately if not yet obtained nephrology consultation may be warranted. 9. Edema of the sacrum and coccyx suspicious for osteomyelitis with small fluid collection in this area and enhancement. Electronically Signed   By: Zetta Bills M.D.   On: 01/02/2021 08:36   [place] EKG SITE RITE  Result Date: 01/02/2021 If Site Rite image not attached, placement could not be confirmed due to current cardiac rhythm.   Medications:  Scheduled: . enoxaparin (LOVENOX) injection  40 mg Subcutaneous Q24H  . senna  1 tablet Oral BID  . vitamin B-12  1,000 mcg Oral Daily   Continuous: . cefTRIAXone (ROCEPHIN)  IV Stopped (01/03/21 0030)  . 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 2) Fistula. 3) Early osteomyelitis of the coccyx.   The patient is stable.  The plan is for him to obtain a PICC line today, which will allow him to be discharged home.  Plan: 1) Continue with antibiotics. 2) [facility] upon [facility]. 3) Signing off.  LOS: 4 days   Jarrad Mclees D 01/03/2021, 8:04 AM

## 2021-01-04 ENCOUNTER — Encounter (HOSPITAL_COMMUNITY): Payer: Self-pay | Admitting: Gastroenterology

## 2021-01-04 LAB — CULTURE, BLOOD (ROUTINE X 2)
Culture: NO GROWTH
Special Requests: ADEQUATE

## 2021-01-05 ENCOUNTER — Ambulatory Visit: Payer: 59 | Attending: Family Medicine | Admitting: Family Medicine

## 2021-01-05 ENCOUNTER — Other Ambulatory Visit: Payer: Self-pay

## 2021-01-05 ENCOUNTER — Telehealth: Payer: Self-pay

## 2021-01-05 ENCOUNTER — Encounter: Payer: Self-pay | Admitting: Family Medicine

## 2021-01-05 VITALS — BP 123/82 | HR 90 | Ht 72.5 in | Wt 200.4 lb

## 2021-01-05 DIAGNOSIS — R634 Abnormal weight loss: Secondary | ICD-10-CM | POA: Diagnosis not present

## 2021-01-05 DIAGNOSIS — N281 Cyst of kidney, acquired: Secondary | ICD-10-CM

## 2021-01-05 DIAGNOSIS — M4628 Osteomyelitis of vertebra, sacral and sacrococcygeal region: Secondary | ICD-10-CM | POA: Diagnosis not present

## 2021-01-05 LAB — CULTURE, BLOOD (ROUTINE X 2)
Culture: NO GROWTH
Special Requests: ADEQUATE

## 2021-01-05 NOTE — Progress Notes (Signed)
Established Patient Office Visit  Subjective:  Patient ID: Shawn [name], male    DOB: 09-Jul-1986  Age: 35 y.o. MRN: [id number]  CC:  Chief Complaint  Patient presents with  . Hypertension    HPI Elazar Argabright is a 35 year old male with a history ofhypertension,dilated cardiomyopathy (diagnosed in 2014, last echo in 11/2020 showed a LVEF of 65-70% with normal left ventricle function and no other structural abnormalities), and alcohol abuse who presents today for a follow-up. At his last visit, there was a complaint of a rectal mass and coccygeal pain and was admitted to the hospital on 5/10. During this admission, an abdominal CT was done which found:  1. Intense rectal thickening and signs of perirectal enhancement and edema with complex fistulous network both above and below the pelvic floor. Rectal thickening extends above the APR but is predominantly below the APR. Findings are associated with small pockets of fluid, none appearing drainable. Differential considerations would include inflammatory bowel disease and neoplasm. 2. Transmural process of both the rectum and likely in the anal canal. 3. Inflammation extends to the penile crura and the base of the corpora spongiosum. 4. Inflammation also suspected in the prostate, without definitive extension into the gland proper. 5. Enhancing tract extending into the soft tissues of the RIGHT medial thigh towards the RIGHT inguinal region potentially from the gluteal cleft. Seen better on previous study. 6. Multiple enlarged lymph nodes in the pelvis and along the RIGHT pelvic sidewall may be reactive or neoplastic. 7. Numerous large gallstones in the gallbladder lumen without evidence of cholecystitis or biliary ductal dilation. 8. Numerous renal cysts some with hemorrhagic or proteinaceous features and some too small for characterization definitively. Compatible with renal cystic disease. Would consider medullary cystic  disease and early autosomal dominant polycystic kidney disease. Ultimately if not yet obtained nephrology consultation may be warranted. 9. Edema of the sacrum and coccyx suspicious for osteomyelitis with small fluid collection in this area and enhancement.   A colonoscopy was ordered following hospital admission which found an area of significantly congested, indurated, friable mucosa in the distal, mid and proximal rectum. Approximately 1/2 circumfirential. Biopsy revealed chronic colitis with focal activity with no granulomata, dysplasia or malignancy identified.   Currently on IV Ceftriaxone which he has to take until June 22. He is tolerating the antibiotic but has had some GI upset and fatigue. Patient has not yet scheduled a follow up with a  kidney specialist but does have an appointment with infectious disease following the completion of his antibiotics  The pain in tailbone is still the same with the pain being around a 10/10 when he sits on it. He has a donut pillow but it is very small and not helping much. He has to lean forward when he sits in order to relieve some pain. He still has oxycodonee left which he is using and feels that it helps.   He has had no issues urinating with no presence of blood or CVA tenderness. No RUQ pain. He has had some constipation following pain medication but has resolved. Reports no diarrhea or blood in stools. He is not currently drinking and has not drank in over a month.   For acute concerns today, he is worried about unintentional weight loss. He has lost 16 lbs since end of December. Reports being very fatigued but no night sweats or fevers. He has had little appetite during his antibiotic treatment.   Denies any chest pain, dizziness, shortness of breath, pedal  edema.  Past Medical History:  Diagnosis Date  . Abscess, perirectal, x2  05/18/2013  . 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 Dilated cardiomyopathy ([place])  05/18/2013   By catheterization 2014   . Hypertension     Past Surgical History:  Procedure Laterality Date  . BIOPSY  01/01/2021   Procedure: BIOPSY;  Surgeon: Ladene Artist, MD;  Location: [facility];  Service: Endoscopy;;  . COLONOSCOPY WITH PROPOFOL N/A 01/01/2021   Procedure: COLONOSCOPY WITH PROPOFOL;  Surgeon: Ladene Artist, MD;  Location: [facility] END[facility];  Service: Endoscopy;  Laterality: N/A;  . LEFT AND RIGHT HEART CATHETERIZATION WITH CORONARY ANGIOGRAM N/A 11/21/2012   Procedure: LEFT AND RIGHT HEART CATHETERIZATION WITH CORONARY ANGIOGRAM;  Surgeon: Birdie Riddle, MD;  Location: [facility];  Service: Cardiovascular;  Laterality: N/A;  . MANDIBLE FRACTURE SURGERY  2006    Family History  Problem Relation Age of Onset  . Diabetes Mother   . Hypertension Other     Social History   Socioeconomic History  . Marital status: Single    Spouse name: Not on file  . Number of children: Not on file  . Years of education: Not on file  . Highest education level: Not on file  Occupational History  . Not on file  Tobacco Use  . Smoking status: Current Every Day Smoker    Packs/day: 1.00    Types: Cigarettes  . Smokeless tobacco: Never Used  Substance and Sexual Activity  . Alcohol use: Yes    Comment: occ  . Drug use: No  . Sexual activity: Not on file  Other Topics Concern  . Not on file  Social History Narrative  . Not on file   Social Determinants of Health   Financial Resource Strain: Not on file  Food Insecurity: Not on file  Transportation Needs: Not on file  Physical Activity: Not on file  Stress: Not on file  Social Connections: Not on file  Intimate Partner Violence: Not on file    Outpatient Medications Prior to Visit  Medication Sig Dispense Refill  . cefTRIAXone (ROCEPHIN) IVPB Inject 2 g into the vein daily. Indication:  Perirectal Abscess First Dose: No Last Day of Therapy:  02/11/2021 Labs - Once weekly:  CBC/D and BMP, Labs - Every other  week:  ESR and CRP Method of administration: IV Push Method of administration may be changed at the discretion of home infusion pharmacist based upon assessment of the patient and/or caregiver's ability to self-administer the medication ordered. 40 Units 0  . docusate sodium (COLACE) 100 MG capsule Take 1 capsule (100 mg total) by mouth every 12 (twelve) hours. 60 capsule 0  . metroNIDAZOLE (FLAGYL) 500 MG tablet Take 1 tablet (500 mg total) by mouth 3 (three) times daily. 120 tablet 0  . oxyCODONE (OXY IR/ROXICODONE) 5 MG immediate release tablet Take 1 tablet (5 mg total) by mouth every 4 (four) hours as needed for up to 5 days for severe pain. 30 tablet 0  . vitamin B-12 1000 MCG tablet Take 1 tablet (1,000 mcg total) by mouth daily. 30 tablet 0  . lisinopril (ZESTRIL) 10 MG tablet Take 1 tablet (10 mg total) by mouth daily. 30 tablet 6   No facility-administered medications prior to visit.    Allergies  Allergen Reactions  . Shrimp [Shellfish Allergy] Shortness Of Breath    ROS Review of Systems  Review of Systems  Constitutional: Positive for fatigue and decreased  appetite.  HENT: Negative for sinus pressure and sore throat.   Eyes: Negative for visual disturbance.  Respiratory: Negative for cough, chest tightness and shortness of breath.   Cardiovascular: Negative for chest pain and leg swelling.  Gastrointestinal: Negative for abdominal distention, abdominal pain, constipation and diarrhea.  Endocrine: Negative.   Genitourinary: Negative for dysuria.  Musculoskeletal: Negative for joint swelling and myalgias. Positive for coccygeal pain.  Skin: Negative for rash.  Allergic/Immunologic: Negative.   Neurological: Negative for weakness, light-headedness and numbness.  Psychiatric/Behavioral: Negative for dysphoric mood and suicidal ideas.     Objective:    Physical Exam  Physical Exam Constitutional:      Appearance: He is well-developed.  Neck:     Vascular: No JVD.   Cardiovascular:     Rate and Rhythm: Normal rate.     Heart sounds: Normal heart sounds. No murmur.  Pulmonary:     Effort: Pulmonary effort is normal.     Breath sounds: Normal breath sounds. No wheezing or rales.  Chest:     Chest wall: No tenderness.  Abdominal:     General: Bowel sounds are normal. There is no distension.     Palpations: Abdomen is soft. There is no mass.     Tenderness: There is no abdominal tenderness.     Rectal Exam:   No visible drainage, lesions or soft tissue abnormalities of the anus. Mild tenderness over the coccyx.  Musculoskeletal:        General: Normal range of motion.     Right lower leg: No edema.     Left lower leg: No edema.  Neurological:     Mental Status: He is alert and oriented to person, place, and time.  Psychiatric:        Mood and Affect: Mood normal.   BP 123/82   Pulse 90   Ht 6' 0.5" (1.842 m)   Wt 200 lb 6.4 oz (90.9 kg)   SpO2 100%   BMI 26.81 kg/m  Wt Readings from Last 3 Encounters:  01/05/21 200 lb 6.4 oz (90.9 kg)  01/02/21 195 lb (88.5 kg)  11/18/20 209 lb 12.8 oz (95.2 kg)     Health Maintenance Due  Topic Date Due  . COVID-19 Vaccine (1) Never done  . Hepatitis C Screening  Never done    There are no preventive care reminders to display for this patient.  No results found for: TSH Lab Results  Component Value Date   WBC 10.4 01/03/2021   HGB 9.0 (L) 01/03/2021   HCT 26.4 (L) 01/03/2021   MCV 102.3 (H) 01/03/2021   PLT 357 01/03/2021   Lab Results  Component Value Date   NA 138 01/03/2021   K 3.9 01/03/2021   CO2 19 (L) 01/03/2021   GLUCOSE 94 01/03/2021   BUN 14 01/03/2021   CREATININE 1.27 (H) 01/03/2021   BILITOT 0.2 (L) 12/31/2020   ALKPHOS 78 12/31/2020   AST 14 (L) 12/31/2020   ALT 14 12/31/2020   PROT 6.6 12/31/2020   ALBUMIN 2.9 (L) 12/31/2020   CALCIUM 8.4 (L) 01/03/2021   ANIONGAP 9 01/03/2021   EGFR 69 11/18/2020   Lab Results  Component Value Date   CHOL 119 11/18/2020    Lab Results  Component Value Date   HDL 33 (L) 11/18/2020   Lab Results  Component Value Date   LDLCALC 65 11/18/2020   Lab Results  Component Value Date   TRIG 111 11/18/2020   Lab Results  Component Value Date   CHOLHDL 3.6 11/18/2020   Lab Results  Component Value Date   HGBA1C 5.6 12/31/2020      Assessment & Plan:   Problem List Items Addressed This Visit   None   Visit Diagnoses    Weight loss    -  Primary Unstable. Patient has lost 16 lbs since the end of December 2021. This is most likely due to the patient being acutely ill recently and receiving IV antibiotics. Encouraged the patient to drink smoothies and small snacks even when he is not very hungry in order to maintain weight. Will check thyroid function to see if weight loss is secondary to underlying thyroid disease.    Relevant Orders   T4, free   TSH   Sacral osteomyelitis ([place])     Controlled via IV antibiotics. Patient is still having some pain but has been taking prescribed oxycodone for pain relief. Counseled the patient on obtaining a pillow donut to relieve some pressure and pain off of his coccyx when sitting. Continue medications as prescribed and follow up with infectious disease on June 22.    Renal cyst     Uncontrolled, needs further evaluation. Patient has not set up an appointment yet, so we have referred him today to see nephrology.Patient is currently not experiencing any urinary symptoms. Discussed CT results with patient today.    Relevant Orders   Ambulatory referral to Nephrology      No orders of the defined types were placed in this encounter.   Follow-up: Return in about 3 months (around 04/07/2021) for Chronic disease management.    Vance Peper, Medical Student   Evaluation and management procedures were performed by me with Medical Student in attendance, note written by Medical Student under my supervision and collaboration. I have reviewed the note and I agree with  the management and plan.   Charlott Rakes, MD, FAAFP. [facility] and [place], [place]   01/05/2021, 12:30 PM

## 2021-01-05 NOTE — Progress Notes (Signed)
Discuss recent MRI results. Pain in tailbone.

## 2021-01-05 NOTE — Telephone Encounter (Signed)
Transition Care Management Follow-up Telephone Call  Date of discharge and from where: 01/03/2021, [facility]   How have you been since you were released from the hospital? Patient has appointment today with Dr Margarita Rana

## 2021-01-06 ENCOUNTER — Telehealth: Payer: Self-pay

## 2021-01-06 LAB — QUANTIFERON-TB GOLD PLUS (RQFGPL)
QuantiFERON Mitogen Value: >10 IU/mL
QuantiFERON Nil Value: 0.02 IU/mL
QuantiFERON TB1 Ag Value: 0.04 IU/mL
QuantiFERON TB2 Ag Value: 0.02 IU/mL

## 2021-01-06 LAB — QUANTIFERON-TB GOLD PLUS: QuantiFERON-TB Gold Plus: NEGATIVE

## 2021-01-06 LAB — TSH: TSH: 2.26 u[IU]/mL (ref 0.450–4.500)

## 2021-01-06 LAB — T4, FREE: Free T4: 1.36 ng/dL (ref 0.82–1.77)

## 2021-01-06 NOTE — Telephone Encounter (Signed)
Patient has viewed results via mychart on 01/06/2021

## 2021-01-08 ENCOUNTER — Telehealth: Payer: Self-pay | Admitting: Gastroenterology

## 2021-01-08 LAB — METHYLMALONIC ACID, SERUM: Methylmalonic Acid, Quantitative: 1263 nmol/L — ABNORMAL HIGH (ref 0–378)

## 2021-01-08 NOTE — Telephone Encounter (Signed)
PT was returning your call. Please give him a call. Thank you

## 2021-01-09 NOTE — Telephone Encounter (Signed)
See results notes for details.

## 2021-02-04 ENCOUNTER — Ambulatory Visit: Payer: 59 | Admitting: Physician Assistant

## 2021-02-09 ENCOUNTER — Telehealth: Payer: Self-pay | Admitting: Family Medicine

## 2021-02-09 ENCOUNTER — Telehealth: Payer: Self-pay

## 2021-02-09 ENCOUNTER — Telehealth: Payer: Self-pay | Admitting: Internal Medicine

## 2021-02-09 DIAGNOSIS — N412 Abscess of prostate: Secondary | ICD-10-CM

## 2021-02-09 MED ORDER — AMOXICILLIN-POT CLAVULANATE 875-125 MG PO TABS: 1.0000 | ORAL_TABLET | Freq: Two times a day (BID) | ORAL | 1 refills | Status: AC

## 2021-02-09 NOTE — Telephone Encounter (Signed)
Shawn [name] with [facility] called asking when is patient finished with his IV antibiotics from the hospital.  Pt does not know how long he is supposed to be on them.  CB#  [phone]

## 2021-02-09 NOTE — Telephone Encounter (Signed)
Thanks Megan!

## 2021-02-09 NOTE — Telephone Encounter (Signed)
Patient with prostatic abscess previously seen by Dr Baxter Flattery with planned 6 weeks iv abx from mid 12/2020 and then transition to oral abx with id f/u   Patient has picc draw trouble I was notified 6/20 Crp 6 (<10) two weeks prior   -relay to [place] with his hh service Reasonable to transition to amox-clav at this time Remove picc Start amox-clav 875 mg po bid Schedule with dr Baxter Flattery 6/30 @ 230 pm

## 2021-02-09 NOTE — Telephone Encounter (Signed)
Home health nurse called, states patient's PICC flushes well but there is no blood return. Nurse was able to obtain labs via peripheral stick. She states patient has not had IV antibiotics since Thursday. Provided nurse with on-call pager number.   Beryle Flock, RN

## 2021-02-10 NOTE — Telephone Encounter (Signed)
Addressed by Infectious disease

## 2021-02-11 ENCOUNTER — Ambulatory Visit: Payer: 59 | Admitting: Internal Medicine

## 2021-02-19 ENCOUNTER — Encounter: Payer: Self-pay | Admitting: Internal Medicine

## 2021-02-19 ENCOUNTER — Other Ambulatory Visit: Payer: Self-pay

## 2021-02-19 ENCOUNTER — Ambulatory Visit (INDEPENDENT_AMBULATORY_CARE_PROVIDER_SITE_OTHER): Payer: 59 | Admitting: Internal Medicine

## 2021-02-19 VITALS — BP 138/86 | HR 106 | Temp 98.2°F | Ht 73.0 in | Wt 200.0 lb

## 2021-02-19 DIAGNOSIS — K50114 Crohn's disease of large intestine with abscess: Secondary | ICD-10-CM | POA: Diagnosis not present

## 2021-02-19 DIAGNOSIS — N412 Abscess of prostate: Secondary | ICD-10-CM

## 2021-02-19 DIAGNOSIS — K611 Rectal abscess: Secondary | ICD-10-CM

## 2021-02-19 DIAGNOSIS — M869 Osteomyelitis, unspecified: Secondary | ICD-10-CM

## 2021-02-19 NOTE — Progress Notes (Signed)
RFV: hospital follow up for perirectal abscess and early coccygeal osteomyelitis  Patient ID: Shawn [name], male   DOB: 1986/02/18, 35 y.o.   MRN: [id number]  HPI Shawn [name] is a 35yo M who was diagnosed crohn's disease with peri-rectal abscess with He finished 6 wk of iv ceftriaxone plus flagyl on 6/22 now changed over to augmentin, which he is tolerating. He states he some discomfort but not noticing the same discomfort as before. No blood in stool.  Outpatient Encounter Medications as of 02/19/2021  Medication Sig   amoxicillin-clavulanate (AUGMENTIN) 875-125 MG tablet Take 1 tablet by mouth 2 (two) times daily.   docusate sodium (COLACE) 100 MG capsule Take 1 capsule (100 mg total) by mouth every 12 (twelve) hours.   lisinopril (ZESTRIL) 10 MG tablet Take 1 tablet (10 mg total) by mouth daily.   vitamin B-12 1000 MCG tablet Take 1 tablet (1,000 mcg total) by mouth daily.   No facility-administered encounter medications on file as of 02/19/2021.     Patient Active Problem List   Diagnosis Date Noted   Abnormal CT scan, colon    Rectal pain    Perirectal abscess 12/30/2020   Perirectal cellulitis    Fistula    Lower urinary tract infectious disease 06/04/2020   Renal lesion 06/04/2020   Proctitis 06/04/2020   Alcohol abuse 11/06/2019   Tobacco abuse 05/18/2013   Dilated cardiomyopathy ([place]) 05/18/2013   Abscess, perirectal, x2  05/18/2013   Chest pain at rest 11/20/2012   Hypertension 11/20/2012     Health Maintenance Due  Topic Date Due   COVID-19 Vaccine (1) Never done   Pneumococcal Vaccine 49-41 Years old (1 - PCV) Never done   Hepatitis C Screening  Never done     Review of Systems Review of Systems  Constitutional: Negative for fever, chills, diaphoresis, activity change, appetite change, fatigue and unexpected weight change.  HENT: Negative for congestion, sore throat, rhinorrhea, sneezing, trouble swallowing and sinus pressure.  Eyes: Negative for photophobia  and visual disturbance.  Respiratory: Negative for cough, chest tightness, shortness of breath, wheezing and stridor.  Cardiovascular: Negative for chest pain, palpitations and leg swelling.  Gastrointestinal: Negative for nausea, vomiting, abdominal pain, diarrhea, constipation, blood in stool, abdominal distention and anal bleeding.  Genitourinary: Negative for dysuria, hematuria, flank pain and difficulty urinating.  Musculoskeletal: Negative for myalgias, back pain, joint swelling, arthralgias and gait problem.  Skin: Negative for color change, pallor, rash and wound.  Neurological: Negative for dizziness, tremors, weakness and light-headedness.  Hematological: Negative for adenopathy. Does not bruise/bleed easily.  Psychiatric/Behavioral: Negative for behavioral problems, confusion, sleep disturbance, dysphoric mood, decreased concentration and agitation.   Physical Exam   BP 138/86   Pulse (!) 106   Temp 98.2 F (36.8 C) (Oral)   Ht 6' 1"  (1.854 m)   Wt 200 lb (90.7 kg)   SpO2 96%   BMI 26.39 kg/m   Physical Exam  Constitutional: He is oriented to person, place, and time. He appears well-developed and well-nourished. No distress.  HENT:  Mouth/Throat: Oropharynx is clear and moist. No oropharyngeal exudate.   Abdominal: Soft. Bowel sounds are normal. He exhibits no distension. There is no tenderness.  Neurological: He is alert and oriented to person, place, and time.  Skin: Skin is warm and dry. No rash noted. No erythema.  Psychiatric: He has a normal mood and affect. His behavior is normal.   Lab Results  Component Value Date   LABRPR NON REACTIVE 12/20/2020  CBC Lab Results  Component Value Date   WBC 10.4 01/03/2021   RBC 2.58 (L) 01/03/2021   HGB 9.0 (L) 01/03/2021   HCT 26.4 (L) 01/03/2021   PLT 357 01/03/2021   MCV 102.3 (H) 01/03/2021   MCH 34.9 (H) 01/03/2021   MCHC 34.1 01/03/2021   RDW 17.2 (H) 01/03/2021   LYMPHSABS 3.7 12/31/2020   MONOABS 0.9  12/31/2020   EOSABS 0.6 (H) 12/31/2020    BMET Lab Results  Component Value Date   NA 138 01/03/2021   K 3.9 01/03/2021   CL 110 01/03/2021   CO2 19 (L) 01/03/2021   GLUCOSE 94 01/03/2021   BUN 14 01/03/2021   CREATININE 1.27 (H) 01/03/2021   CALCIUM 8.4 (L) 01/03/2021   GFRNONAA >60 01/03/2021   GFRAA >60 01/08/2020      Assessment and Plan 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

## 2021-02-20 LAB — SEDIMENTATION RATE: Sed Rate: 29 mm/h — ABNORMAL HIGH (ref 0–15)

## 2021-03-05 ENCOUNTER — Ambulatory Visit: Payer: 59 | Admitting: Physician Assistant

## 2021-03-19 ENCOUNTER — Encounter: Payer: Self-pay | Admitting: Internal Medicine

## 2021-03-19 ENCOUNTER — Other Ambulatory Visit: Payer: Self-pay

## 2021-03-19 ENCOUNTER — Ambulatory Visit (INDEPENDENT_AMBULATORY_CARE_PROVIDER_SITE_OTHER): Payer: 59 | Admitting: Internal Medicine

## 2021-03-19 VITALS — BP 141/98 | HR 92 | Temp 98.5°F | Wt 202.0 lb

## 2021-03-19 DIAGNOSIS — M869 Osteomyelitis, unspecified: Secondary | ICD-10-CM

## 2021-03-19 NOTE — Progress Notes (Signed)
    Patient ID: Shawn [name], male   DOB: 07-Oct-1985, 35 y.o.   MRN: [id number]  HPI  No blood in stool. Sometimes 2-3 a day. - bowel habits back to normal Outpatient Encounter Medications as of 03/19/2021  Medication Sig   lisinopril (ZESTRIL) 10 MG tablet Take 1 tablet (10 mg total) by mouth daily.   vitamin B-12 1000 MCG tablet Take 1 tablet (1,000 mcg total) by mouth daily.   docusate sodium (COLACE) 100 MG capsule Take 1 capsule (100 mg total) by mouth every 12 (twelve) hours. (Patient not taking: Reported on 03/19/2021)   No facility-administered encounter medications on file as of 03/19/2021.     Patient Active Problem List   Diagnosis Date Noted   Abnormal CT scan, colon    Rectal pain    Perirectal abscess 12/30/2020   Perirectal cellulitis    Fistula    Lower urinary tract infectious disease 06/04/2020   Renal lesion 06/04/2020   Proctitis 06/04/2020   Alcohol abuse 11/06/2019   Tobacco abuse 05/18/2013   Dilated cardiomyopathy ([place]) 05/18/2013   Abscess, perirectal, x2  05/18/2013   Chest pain at rest 11/20/2012   Hypertension 11/20/2012     Health Maintenance Due  Topic Date Due   COVID-19 Vaccine (1) Never done   Pneumococcal Vaccine 85-58 Years old (1 - PCV) Never done   Hepatitis C Screening  Never done     Review of Systems  Physical Exam   BP (!) 141/98   Pulse 92   Temp 98.5 F (36.9 C) (Oral)   Wt 202 lb (91.6 kg)   SpO2 98%   BMI 26.65 kg/m    No results found for: CD4TCELL No results found for: CD4TABS No results found for: HIV1RNAQUANT No results found for: HEPBSAB Lab Results  Component Value Date   LABRPR NON REACTIVE 12/20/2020    CBC Lab Results  Component Value Date   WBC 10.4 01/03/2021   RBC 2.58 (L) 01/03/2021   HGB 9.0 (L) 01/03/2021   HCT 26.4 (L) 01/03/2021   PLT 357 01/03/2021   MCV 102.3 (H) 01/03/2021   MCH 34.9 (H) 01/03/2021   MCHC 34.1 01/03/2021   RDW 17.2 (H) 01/03/2021   LYMPHSABS 3.7 12/31/2020    MONOABS 0.9 12/31/2020   EOSABS 0.6 (H) 12/31/2020    BMET Lab Results  Component Value Date   NA 138 01/03/2021   K 3.9 01/03/2021   CL 110 01/03/2021   CO2 19 (L) 01/03/2021   GLUCOSE 94 01/03/2021   BUN 14 01/03/2021   CREATININE 1.27 (H) 01/03/2021   CALCIUM 8.4 (L) 01/03/2021   GFRNONAA >60 01/03/2021   GFRAA >60 01/08/2020      Assessment and Plan   RTC if needed

## 2021-03-20 LAB — SEDIMENTATION RATE: Sed Rate: 33 mm/h — ABNORMAL HIGH (ref 0–15)

## 2021-04-14 ENCOUNTER — Encounter: Payer: Self-pay | Admitting: Physician Assistant

## 2021-04-14 ENCOUNTER — Other Ambulatory Visit: Payer: 59

## 2021-04-14 ENCOUNTER — Other Ambulatory Visit (INDEPENDENT_AMBULATORY_CARE_PROVIDER_SITE_OTHER): Payer: 59

## 2021-04-14 ENCOUNTER — Ambulatory Visit (INDEPENDENT_AMBULATORY_CARE_PROVIDER_SITE_OTHER): Payer: 59 | Admitting: Physician Assistant

## 2021-04-14 VITALS — BP 120/80 | HR 103 | Ht 73.0 in | Wt 205.4 lb

## 2021-04-14 DIAGNOSIS — K6289 Other specified diseases of anus and rectum: Secondary | ICD-10-CM | POA: Diagnosis not present

## 2021-04-14 DIAGNOSIS — Z8719 Personal history of other diseases of the digestive system: Secondary | ICD-10-CM

## 2021-04-14 LAB — COMPREHENSIVE METABOLIC PANEL
ALT: 26 U/L (ref 0–53)
AST: 19 U/L (ref 0–37)
Albumin: 3.9 g/dL (ref 3.5–5.2)
Alkaline Phosphatase: 99 U/L (ref 39–117)
BUN: 21 mg/dL (ref 6–23)
CO2: 21 mEq/L (ref 19–32)
Calcium: 9.2 mg/dL (ref 8.4–10.5)
Chloride: 108 mEq/L (ref 96–112)
Creatinine, Ser: 1.44 mg/dL (ref 0.40–1.50)
GFR: 62.35 mL/min (ref >60.00)
Glucose, Bld: 84 mg/dL (ref 70–99)
Potassium: 4.1 mEq/L (ref 3.5–5.1)
Sodium: 138 mEq/L (ref 135–145)
Total Bilirubin: 0.4 mg/dL (ref 0.2–1.2)
Total Protein: 8.2 g/dL (ref 6.0–8.3)

## 2021-04-14 LAB — CBC WITH DIFFERENTIAL/PLATELET
Basophils Absolute: 0 10*3/uL (ref 0.0–0.1)
Basophils Relative: 0.4 % (ref 0.0–3.0)
Eosinophils Absolute: 0.4 10*3/uL (ref 0.0–0.7)
Eosinophils Relative: 4.1 % (ref 0.0–5.0)
HCT: 36.7 % — ABNORMAL LOW (ref 39.0–52.0)
Hemoglobin: 12.2 g/dL — ABNORMAL LOW (ref 13.0–17.0)
Lymphocytes Relative: 17.5 % (ref 12.0–46.0)
Lymphs Abs: 1.6 10*3/uL (ref 0.7–4.0)
MCHC: 33.3 g/dL (ref 30.0–36.0)
MCV: 97.5 fl (ref 78.0–100.0)
Monocytes Absolute: 0.6 10*3/uL (ref 0.1–1.0)
Monocytes Relative: 6.2 % (ref 3.0–12.0)
Neutro Abs: 6.5 10*3/uL (ref 1.4–7.7)
Neutrophils Relative %: 71.8 % (ref 43.0–77.0)
Platelets: 278 10*3/uL (ref 150.0–400.0)
RBC: 3.77 Mil/uL — ABNORMAL LOW (ref 4.22–5.81)
RDW: 19.4 % — ABNORMAL HIGH (ref 11.5–15.5)
WBC: 9.1 10*3/uL (ref 4.0–10.5)

## 2021-04-14 LAB — C-REACTIVE PROTEIN: CRP: 8.6 mg/dL (ref 0.5–20.0)

## 2021-04-14 NOTE — Progress Notes (Signed)
Chief Complaint: Follow-up hospitalization for rectal pain and rectal abscess  HPI:    Shawn [name] is a 35 year old African-American male with a past medical history as listed below including dilated cardiomyopathy, who presents to clinic today for follow-up after recent hospitalization for rectal pain and rectal abscess.    12/31/2020 patient consulted by our service for rectal pain. 12/20/20 CT of the pelvis with contrast shows stable abnormal rectum with diffuse wall thickening and adjacent fat stranding question underlying proctitis versus underlying coast carcinoma.  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  Enlarged perirectal lymph nodes.  Fat-containing right inguinal hernia.  Patient underwent colonoscopy 01/01/2021 with congested, indurated, friable mucosa in the rectum.  Pathology showed chronic colitis with focal activity.  Patient was given a PICC line for early osteomyelitis of the coccyx.    03/19/2021 patient followed with Dr. Baxter Flattery and infectious disease and they signed off after he completed a course of 6 weeks of IV ceftriaxone plus Flagyl on 6/22 and changed to Augmentin.    Today, the patient presents to clinic and tells me that he was doing fine after following with infectious disease as above but on Friday, 04/10/2021 he started back with coccygeal pain.  He was waiting a few days to see if this would go away but it seemed to worsen over the weekend now rated as a 9-10/10 making it hard for him to walk or sit still.  He denies any fever, chills or blood in his stool.  Tells me his bowel movements have actually been soft solid and normal.    Denies weight loss.  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   PO B12 initiated 12/31/20   Dilated cardiomyopathy ([place]) 05/18/2013   By catheterization 2014    Hypertension     Past Surgical History:  Procedure Laterality Date   BIOPSY  01/01/2021   Procedure:  BIOPSY;  Surgeon: Ladene Artist, MD;  Location: [facility] ENDOSCOPY;  Service: Endoscopy;;   COLONOSCOPY WITH PROPOFOL N/A 01/01/2021   Procedure: COLONOSCOPY WITH PROPOFOL;  Surgeon: Ladene Artist, MD;  Location: [facility] ENDOSCOPY;  Service: Endoscopy;  Laterality: N/A;   LEFT AND RIGHT HEART CATHETERIZATION WITH CORONARY ANGIOGRAM N/A 11/21/2012   Procedure: LEFT AND RIGHT HEART CATHETERIZATION WITH CORONARY ANGIOGRAM;  Surgeon: Birdie Riddle, MD;  Location: [facility] CATH LAB;  Service: Cardiovascular;  Laterality: N/A;   MANDIBLE FRACTURE SURGERY  2006    Current Outpatient Medications  Medication Sig Dispense Refill   lisinopril (ZESTRIL) 10 MG tablet Take 1 tablet (10 mg total) by mouth daily. 30 tablet 6   vitamin B-12 1000 MCG tablet Take 1 tablet (1,000 mcg total) by mouth daily. 30 tablet 0   No current facility-administered medications for this visit.    Allergies as of 04/14/2021 - Review Complete 03/19/2021  Allergen Reaction Noted   Shrimp [shellfish allergy] Shortness Of Breath 05/16/2013    Family History  Problem Relation Age of Onset   Diabetes Mother    Hypertension Other     Social History   Socioeconomic History   Marital status: Single    Spouse name: Not on file   Number of children: Not on file   Years of education: Not on file   Highest education level: Not on file  Occupational History   Not on file  Tobacco Use   Smoking status: Every Day    Packs/day: 0.25  Types: Cigarettes   Smokeless tobacco: Never  Substance and Sexual Activity   Alcohol use: Yes    Comment: occ   Drug use: No   Sexual activity: Not on file  Other Topics Concern   Not on file  Social History Narrative   Not on file   Social Determinants of Health   Financial Resource Strain: Not on file  Food Insecurity: Not on file  Transportation Needs: Not on file  Physical Activity: Not on file  Stress: Not on file  Social Connections: Not on file  Intimate Partner Violence: Not on  file    Review of Systems:    Constitutional: No weight loss, fever or chills Cardiovascular: No chest pain Respiratory: No SOB Gastrointestinal: See HPI and otherwise negative   Physical Exam:  Vital signs: BP 120/80   Pulse (!) 103   Ht 6' 1"  (1.854 m)   Wt 205 lb 6 oz (93.2 kg)   BMI 27.10 kg/m    Constitutional:   Pleasant AA male appears to be in NAD, Well developed, Well nourished, alert and cooperative Respiratory: Respirations even and unlabored. Lungs clear to auscultation bilaterally.   No wheezes, crackles, or rhonchi.  Cardiovascular: Normal S1, S2. No MRG. Regular rate and rhythm. No peripheral edema, cyanosis or pallor.  Gastrointestinal:  Soft, nondistended, nontender. No rebound or guarding. Normal bowel sounds. No appreciable masses or hepatomegaly. Rectal:  External: pain over coccyx, rectum appears normal Psychiatric: Oriented to person, place and time. Demonstrates good judgement and reason without abnormal affect or behaviors.  RELEVANT LABS AND IMAGING: CBC    Component Value Date/Time   WBC 10.4 01/03/2021 0129   RBC 2.58 (L) 01/03/2021 0129   HGB 9.0 (L) 01/03/2021 0129   HCT 26.4 (L) 01/03/2021 0129   PLT 357 01/03/2021 0129   MCV 102.3 (H) 01/03/2021 0129   MCH 34.9 (H) 01/03/2021 0129   MCHC 34.1 01/03/2021 0129   RDW 17.2 (H) 01/03/2021 0129   LYMPHSABS 3.7 12/31/2020 0039   MONOABS 0.9 12/31/2020 0039   EOSABS 0.6 (H) 12/31/2020 0039   BASOSABS 0.0 12/31/2020 0039    CMP     Component Value Date/Time   NA 138 01/03/2021 0129   NA 141 11/18/2020 1117   K 3.9 01/03/2021 0129   CL 110 01/03/2021 0129   CO2 19 (L) 01/03/2021 0129   GLUCOSE 94 01/03/2021 0129   BUN 14 01/03/2021 0129   BUN 17 11/18/2020 1117   CREATININE 1.27 (H) 01/03/2021 0129   CALCIUM 8.4 (L) 01/03/2021 0129   PROT 6.6 12/31/2020 0039   PROT 7.8 11/18/2020 1117   ALBUMIN 2.9 (L) 12/31/2020 0039   ALBUMIN 4.0 11/18/2020 1117   AST 14 (L) 12/31/2020 0039   ALT 14  12/31/2020 0039   ALKPHOS 78 12/31/2020 0039   BILITOT 0.2 (L) 12/31/2020 0039   BILITOT 0.3 11/18/2020 1117   GFRNONAA >60 01/03/2021 0129   GFRAA >60 01/08/2020 1830    Assessment: 1.  Coccygeal pain: Returned 4 days ago, likely related to recent osteomyelitis 2.  History of rectal abscess: Recent colonoscopy with possibility of Crohn's, treatment with 6 weeks of IV antibiotics followed by Augmentin, patient previously followed with ID and was doing well, but started back with pain as above 4 days ago; consider repeat abscess +/-coccygeal osteo-myelitis  Plan: 1.  Repeat CBC, CMP and CRP today. 2.  Ordered stat CT of the pelvis with and without contrast 3.  Pending above patient may need referral  back to infectious disease versus flex sigmoidoscopy for further evaluation of Crohn's disease. 4.  Discussed with patient that he could proceed to the ER if pain or symptoms worsen, they would be able to provide him a quicker work-up 5.  Patient follow in clinic per recommendations after labs and imaging above.  Ellouise Newer, PA-C [facility] 04/14/2021, 2:57 PM  Cc: Charlott Rakes, MD

## 2021-04-14 NOTE — Progress Notes (Signed)
Management discussed with Ellouise Newer, PA-C.  Coccygeal pain. History of perirectal abscess, perirectal fistulae and coccygeal osteomyelitis. Suspected Crohn's disease however rectal biopsies in May 2012 were not diagnostic. Pelvic CT and blood work today. Pending CT results consider flexible sigmoidoscopy. If his infection is determined to be cleared will plan to start a biologic, probably Remicade.   Pricilla Riffle. Fuller Plan MD

## 2021-04-14 NOTE — Patient Instructions (Addendum)
Your provider has requested that you go to the basement level for lab work before leaving today. Press "B" on the elevator. The lab is located at the first door on the left as you exit the elevator.   You have been scheduled for a CT scan of the abdomen and pelvis at [facility] ([address]---this is in the same building as [employer]).   You are scheduled on Wednesday 04/15/21 at 2:30 pm. You should arrive 15 minutes prior to your appointment time for registration. Please follow the written instructions below on the day of your exam:  WARNING: IF YOU ARE ALLERGIC TO IODINE/X-RAY DYE, PLEASE NOTIFY RADIOLOGY IMMEDIATELY AT [phone]! YOU WILL BE GIVEN A 13 HOUR PREMEDICATION PREP.  1) Do not eat anything after 10:30 am (4 hours prior to your test) 2) You have been given 2 bottles of oral contrast to drink. The solution may taste better if refrigerated, but do NOT add ice or any other liquid to this solution. Shake well before drinking.    Drink 1 bottle of contrast @ 12:30 pm (2 hours prior to your exam)    You may take any medications as prescribed with a small amount of water, if necessary. If you take any of the following medications: METFORMIN, GLUCOPHAGE, GLUCOVANCE, AVANDAMET, RIOMET, FORTAMET, [place] MET, JANUMET, GLUMETZA or METAGLIP, you MAY be asked to HOLD this medication 48 hours AFTER the exam.  The purpose of you drinking the oral contrast is to aid in the visualization of your intestinal tract. The contrast solution may cause some diarrhea. Depending on your individual set of symptoms, you may also receive an intravenous injection of x-ray contrast/dye. Plan on being at [facility] for 30 minutes or longer, depending on the type of exam you are having performed.  This test typically takes 30-45 minutes to complete.  If you have any questions regarding your exam or if you need to reschedule, you may call the CT department at [phone]  between the hours of 8:00 am and 5:00 pm, Monday-Friday.  ________________________________________________________________________

## 2021-04-15 ENCOUNTER — Other Ambulatory Visit: Payer: Self-pay

## 2021-04-15 ENCOUNTER — Ambulatory Visit (INDEPENDENT_AMBULATORY_CARE_PROVIDER_SITE_OTHER)
Admission: RE | Admit: 2021-04-15 | Discharge: 2021-04-15 | Disposition: A | Discharge status: Home / Self Care | Payer: 59 | Source: Ambulatory Visit | Attending: Physician Assistant | Admitting: Physician Assistant

## 2021-04-15 DIAGNOSIS — Z8719 Personal history of other diseases of the digestive system: Secondary | ICD-10-CM

## 2021-04-15 DIAGNOSIS — K6289 Other specified diseases of anus and rectum: Secondary | ICD-10-CM | POA: Diagnosis not present

## 2021-04-15 IMAGING — CT CT PELVIS WO/W CM
2 of 6 series · 15 of 46 positions shown, 17 images · IV contrast (omnipaque)
Comparison: CT pelvis, [DATE], MR pelvis, [DATE]

CLINICAL DATA: History of rectal abscess, recent hospitalization
and IV antibiotic treatment, recurrent low rectal pain

EXAM:
CT PELVIS WITH AND WITHOUT CONTRAST
TECHNIQUE: Multidetector CT imaging of the pelvis was performed following the
standard protocol before and following the bolus administration of
intravenous contrast.
CONTRAST:  100mL OMNIPAQUE IOHEXOL 300 MG/ML  SOLN

[Series 2: abd/ pelvis · axial · 0.73mm/px · z∈[-397,-137]mm · 12 of 60 slices shown, 14 images]
[im 4/60  soft-tissue]
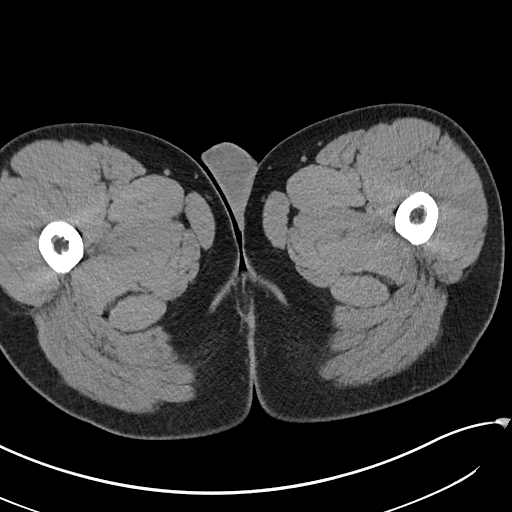
[im 4/60  bone]
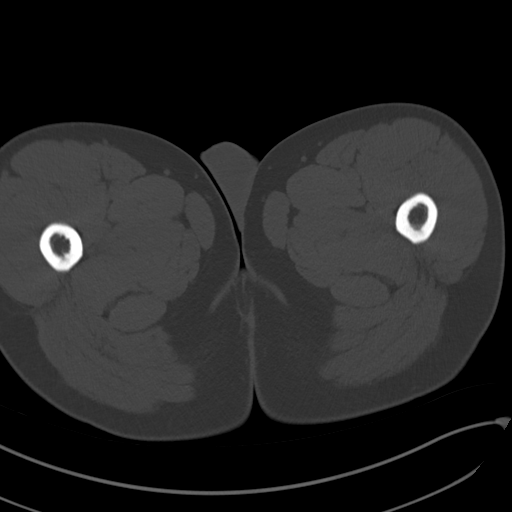
[im 8/60  soft-tissue]
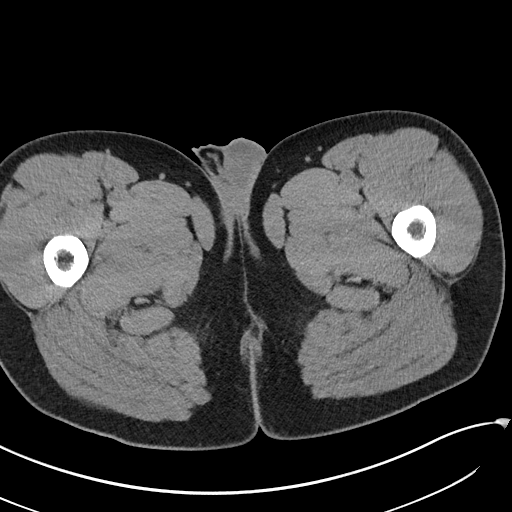
[im 12/60  soft-tissue]
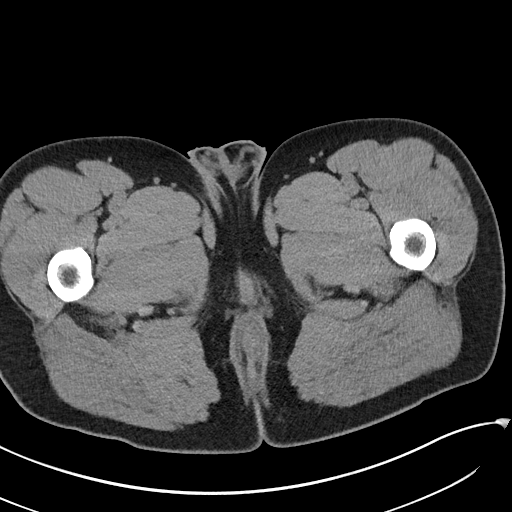
[im 20/60  soft-tissue]
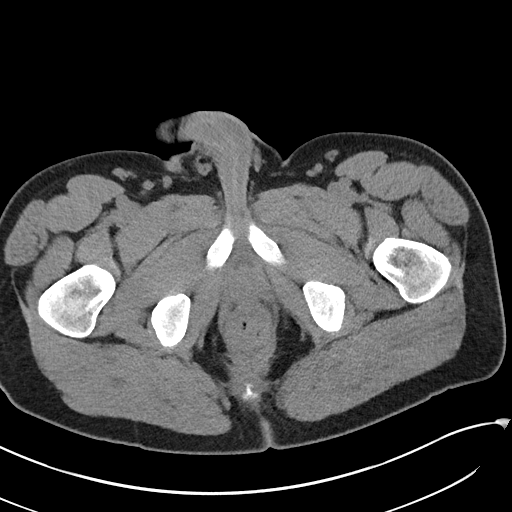
[im 24/60  soft-tissue]
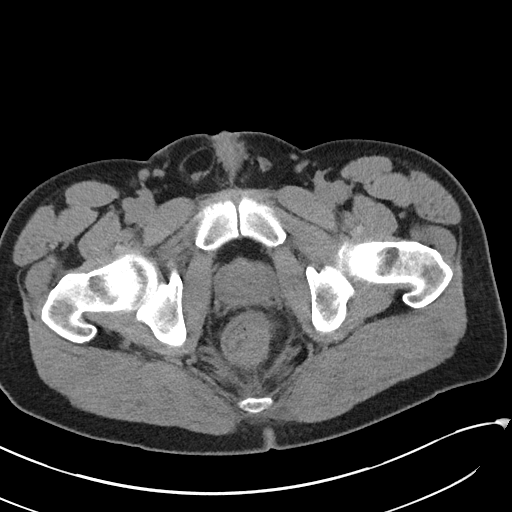
[im 28/60  soft-tissue]
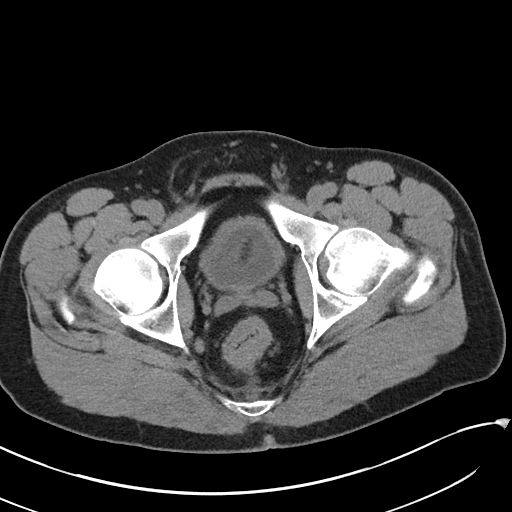
[im 32/60  soft-tissue]
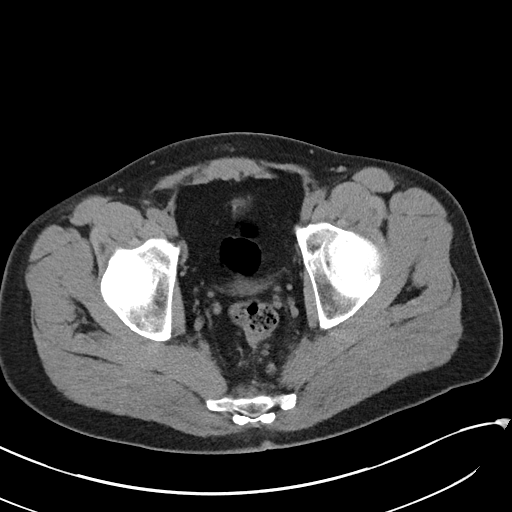
[im 36/60  soft-tissue]
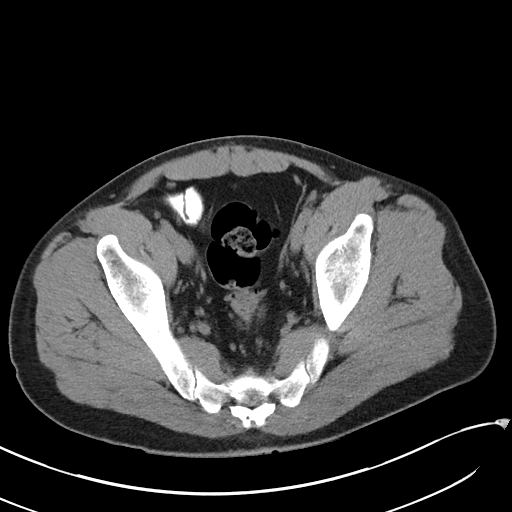
[im 40/60  soft-tissue]
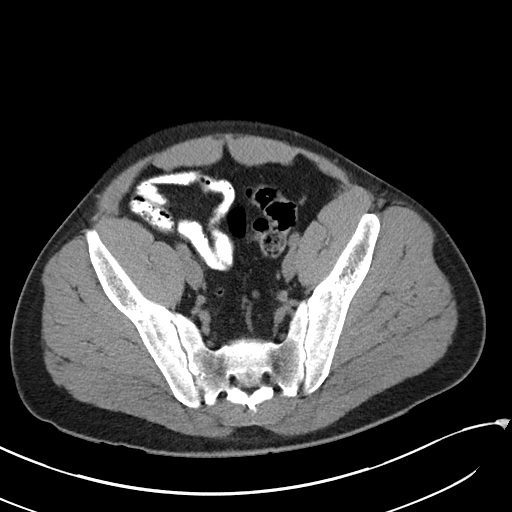
[im 40/60  bone]
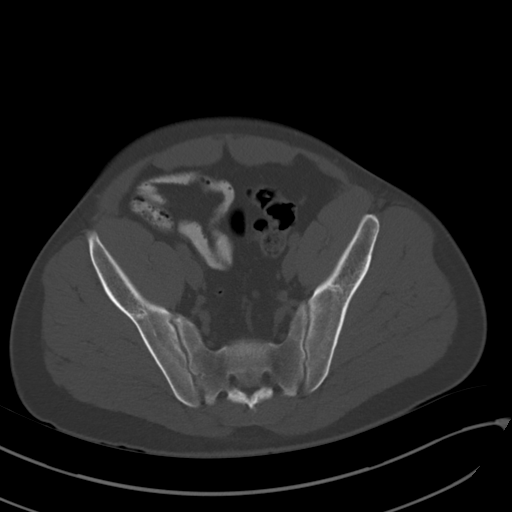
[im 48/60  soft-tissue]
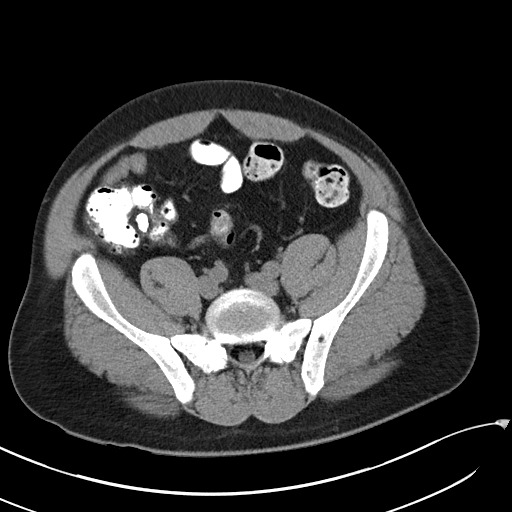
[im 52/60  soft-tissue]
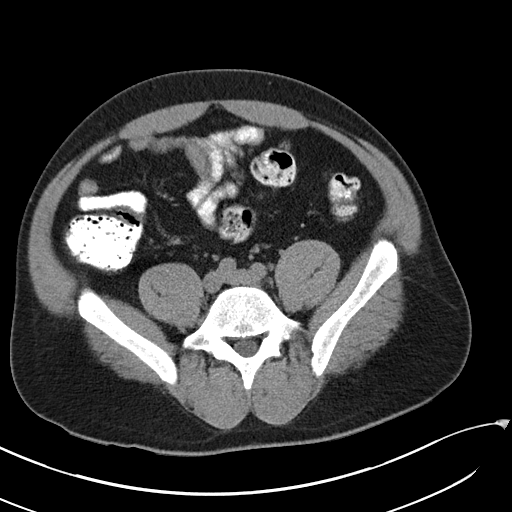
[im 56/60  soft-tissue]
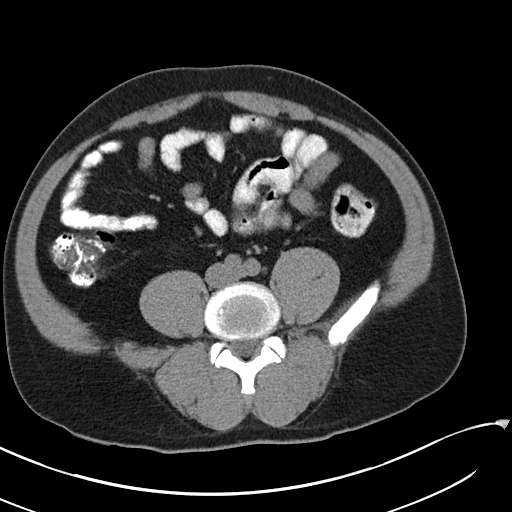

[Series 4: cor st · coronal · 0.58mm/px · 3 of 78 slices shown]
[im 20/78  soft-tissue]
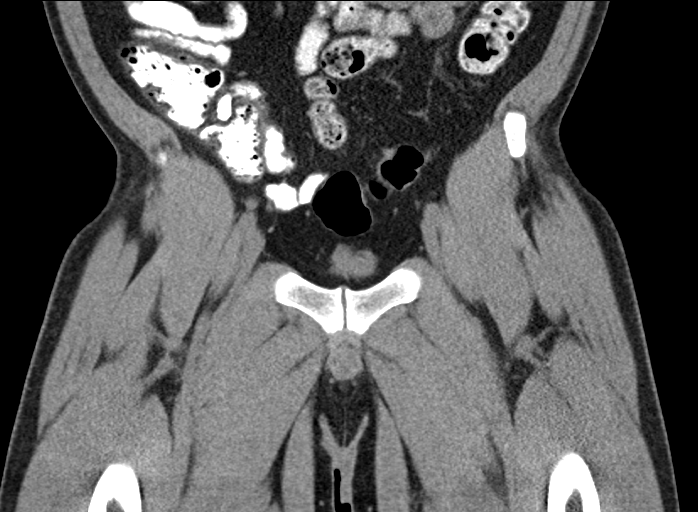
[im 39/78  soft-tissue]
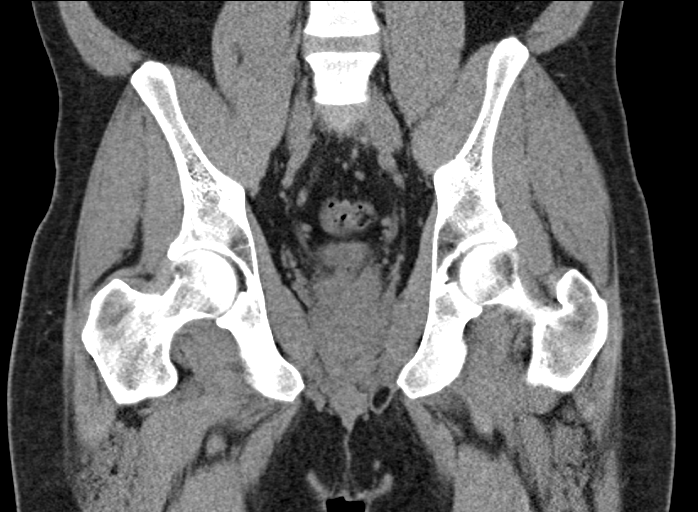
[im 58/78  soft-tissue]
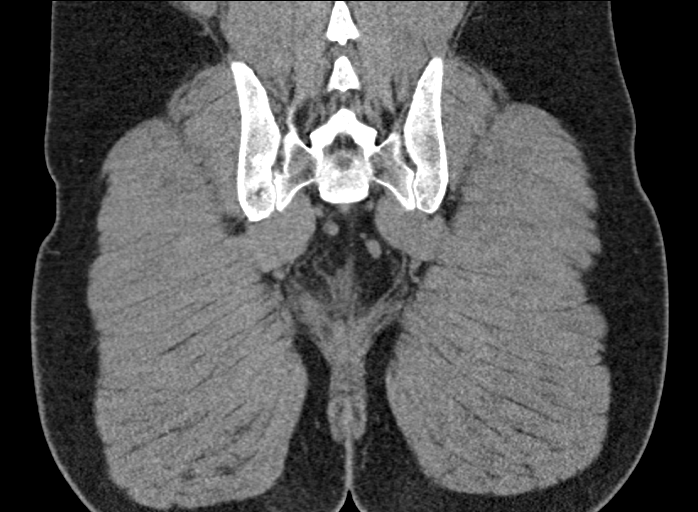

[15 of 46 positions shown; findings below may reference images not displayed]

FINDINGS: Urinary Tract:  No abnormality visualized.

Bowel: Redemonstrated circumferential thickening of the low rectum
(series 2, image 37). At the left aspect of the low rectum, above
the levator musculature, there is a probable small air containing
abscess cavity measuring 1.3 x 0.7 cm, better detailed by prior MR
and previously fluid-filled, measuring approximately 3.0 x 0.9 cm at
that time (series 6, image 36, series 8, image 52). Numerous
prominent subcentimeter perirectal lymph nodes, unchanged (series 6,
image 25). Inflammatory fat stranding and contrast enhancement again
extends into the posterior soft tissues and appears to involve the
tip of the coccyx (series 6, image 39).

Vascular/Lymphatic: No pathologically enlarged lymph nodes. No
significant vascular abnormality seen.

Reproductive:  No mass or other significant abnormality

Other:  None.

Musculoskeletal: No suspicious bone lesions identified.
IMPRESSION: 1. Redemonstrated circumferential thickening of the low rectum. At
the left aspect of the low rectum, above the levator musculature,
there is a probable small air containing abscess cavity measuring
1.3 x 0.7 cm, better detailed by prior MR and previously
fluid-filled, measuring approximately 3.0 x 0.9 cm at that time.
Findings are consistent with persistent, although improved abscess.
2. Inflammatory fat stranding and contrast enhancement again extends
into the posterior soft tissues and appears to involve the tip of
the coccyx. As on prior examination, this is suspicious for
osteomyelitis.
3. Multiple additional findings of prior MR examination are much
less clearly detailed by CT, including multiple suspected fistula
tracts and inflammatory involvement of the prostate and penile base.
Consider repeat contrast enhanced MR of the pelvis to further
evaluate.
4. Numerous prominent subcentimeter perirectal lymph nodes,
unchanged and likely reactive.

## 2021-04-15 MED ORDER — IOHEXOL 300 MG/ML  SOLN
100.0000 mL | Freq: Once | INTRAMUSCULAR | Status: AC | PRN
  Administered 2021-04-15: 100 mL via INTRAVENOUS

## 2021-04-16 ENCOUNTER — Telehealth: Payer: Self-pay

## 2021-04-16 NOTE — Telephone Encounter (Signed)
Provider Levin Erp, PA would like treatment recommendations based on the CT results.  Thanks!

## 2021-04-17 ENCOUNTER — Other Ambulatory Visit: Payer: Self-pay

## 2021-04-17 ENCOUNTER — Emergency Department (HOSPITAL_COMMUNITY): Admission: EM | Admit: 2021-04-17 | Discharge: 2021-04-17 | Discharge status: Against Medical Advice | Payer: 59

## 2021-04-17 NOTE — ED Notes (Signed)
Pt called for triage x3, no answer.

## 2021-04-19 ENCOUNTER — Other Ambulatory Visit: Payer: Self-pay

## 2021-04-19 ENCOUNTER — Inpatient Hospital Stay (HOSPITAL_COMMUNITY): Payer: 59

## 2021-04-19 ENCOUNTER — Inpatient Hospital Stay (HOSPITAL_COMMUNITY)
Admission: EM | Admit: 2021-04-19 | Discharge: 2021-04-22 | DRG: 982 | Disposition: A | Discharge status: Home / Self Care | Payer: 59 | Attending: Family Medicine | Admitting: Family Medicine

## 2021-04-19 ENCOUNTER — Emergency Department (HOSPITAL_COMMUNITY): Payer: 59

## 2021-04-19 ENCOUNTER — Encounter (HOSPITAL_COMMUNITY): Payer: Self-pay | Admitting: Emergency Medicine

## 2021-04-19 DIAGNOSIS — Z833 Family history of diabetes mellitus: Secondary | ICD-10-CM | POA: Diagnosis not present

## 2021-04-19 DIAGNOSIS — M533 Sacrococcygeal disorders, not elsewhere classified: Secondary | ICD-10-CM | POA: Diagnosis present

## 2021-04-19 DIAGNOSIS — I420 Dilated cardiomyopathy: Secondary | ICD-10-CM | POA: Diagnosis present

## 2021-04-19 DIAGNOSIS — Z20822 Contact with and (suspected) exposure to covid-19: Secondary | ICD-10-CM | POA: Diagnosis present

## 2021-04-19 DIAGNOSIS — E872 Acidosis, unspecified: Secondary | ICD-10-CM

## 2021-04-19 DIAGNOSIS — K611 Rectal abscess: Secondary | ICD-10-CM | POA: Diagnosis present

## 2021-04-19 DIAGNOSIS — I129 Hypertensive chronic kidney disease with stage 1 through stage 4 chronic kidney disease, or unspecified chronic kidney disease: Secondary | ICD-10-CM | POA: Diagnosis present

## 2021-04-19 DIAGNOSIS — N281 Cyst of kidney, acquired: Secondary | ICD-10-CM | POA: Diagnosis present

## 2021-04-19 DIAGNOSIS — Z599 Problem related to housing and economic circumstances, unspecified: Secondary | ICD-10-CM | POA: Diagnosis not present

## 2021-04-19 DIAGNOSIS — R590 Localized enlarged lymph nodes: Secondary | ICD-10-CM | POA: Diagnosis present

## 2021-04-19 DIAGNOSIS — Z720 Tobacco use: Secondary | ICD-10-CM | POA: Diagnosis not present

## 2021-04-19 DIAGNOSIS — K529 Noninfective gastroenteritis and colitis, unspecified: Secondary | ICD-10-CM | POA: Diagnosis not present

## 2021-04-19 DIAGNOSIS — Z91013 Allergy to seafood: Secondary | ICD-10-CM

## 2021-04-19 DIAGNOSIS — R52 Pain, unspecified: Secondary | ICD-10-CM

## 2021-04-19 DIAGNOSIS — K6289 Other specified diseases of anus and rectum: Secondary | ICD-10-CM | POA: Diagnosis present

## 2021-04-19 DIAGNOSIS — F17210 Nicotine dependence, cigarettes, uncomplicated: Secondary | ICD-10-CM | POA: Diagnosis present

## 2021-04-19 DIAGNOSIS — M869 Osteomyelitis, unspecified: Secondary | ICD-10-CM | POA: Diagnosis present

## 2021-04-19 DIAGNOSIS — L02214 Cutaneous abscess of groin: Secondary | ICD-10-CM | POA: Diagnosis present

## 2021-04-19 DIAGNOSIS — L732 Hidradenitis suppurativa: Secondary | ICD-10-CM | POA: Diagnosis present

## 2021-04-19 DIAGNOSIS — E538 Deficiency of other specified B group vitamins: Secondary | ICD-10-CM | POA: Diagnosis present

## 2021-04-19 DIAGNOSIS — I10 Essential (primary) hypertension: Secondary | ICD-10-CM | POA: Diagnosis not present

## 2021-04-19 DIAGNOSIS — K5900 Constipation, unspecified: Secondary | ICD-10-CM | POA: Diagnosis present

## 2021-04-19 DIAGNOSIS — K50913 Crohn's disease, unspecified, with fistula: Secondary | ICD-10-CM | POA: Diagnosis present

## 2021-04-19 DIAGNOSIS — M4628 Osteomyelitis of vertebra, sacral and sacrococcygeal region: Secondary | ICD-10-CM | POA: Diagnosis present

## 2021-04-19 DIAGNOSIS — R933 Abnormal findings on diagnostic imaging of other parts of digestive tract: Secondary | ICD-10-CM | POA: Diagnosis not present

## 2021-04-19 DIAGNOSIS — K4090 Unilateral inguinal hernia, without obstruction or gangrene, not specified as recurrent: Secondary | ICD-10-CM | POA: Diagnosis present

## 2021-04-19 DIAGNOSIS — Z87440 Personal history of urinary (tract) infections: Secondary | ICD-10-CM | POA: Diagnosis not present

## 2021-04-19 DIAGNOSIS — Z8249 Family history of ischemic heart disease and other diseases of the circulatory system: Secondary | ICD-10-CM | POA: Diagnosis not present

## 2021-04-19 DIAGNOSIS — D539 Nutritional anemia, unspecified: Secondary | ICD-10-CM | POA: Diagnosis present

## 2021-04-19 DIAGNOSIS — N1831 Chronic kidney disease, stage 3a: Secondary | ICD-10-CM | POA: Diagnosis present

## 2021-04-19 LAB — COMPREHENSIVE METABOLIC PANEL
ALT: 31 U/L (ref 0–44)
AST: 22 U/L (ref 15–41)
Albumin: 3.5 g/dL (ref 3.5–5.0)
Alkaline Phosphatase: 93 U/L (ref 38–126)
Anion gap: 13 (ref 5–15)
BUN: 17 mg/dL (ref 6–20)
CO2: 16 mmol/L — ABNORMAL LOW (ref 22–32)
Calcium: 9.1 mg/dL (ref 8.9–10.3)
Chloride: 108 mmol/L (ref 98–111)
Creatinine, Ser: 1.58 mg/dL — ABNORMAL HIGH (ref 0.61–1.24)
GFR, Estimated: 58 mL/min — ABNORMAL LOW (ref >60)
Glucose, Bld: 107 mg/dL — ABNORMAL HIGH (ref 70–99)
Potassium: 4.1 mmol/L (ref 3.5–5.1)
Sodium: 137 mmol/L (ref 135–145)
Total Bilirubin: 0.7 mg/dL (ref 0.3–1.2)
Total Protein: 8 g/dL (ref 6.5–8.1)

## 2021-04-19 LAB — CBC WITH DIFFERENTIAL/PLATELET
Abs Immature Granulocytes: 0.06 10*3/uL (ref 0.00–0.07)
Basophils Absolute: 0.1 10*3/uL (ref 0.0–0.1)
Basophils Relative: 1 %
Eosinophils Absolute: 0.5 10*3/uL (ref 0.0–0.5)
Eosinophils Relative: 4 %
HCT: 36.7 % — ABNORMAL LOW (ref 39.0–52.0)
Hemoglobin: 12.5 g/dL — ABNORMAL LOW (ref 13.0–17.0)
Immature Granulocytes: 1 %
Lymphocytes Relative: 29 %
Lymphs Abs: 3.4 10*3/uL (ref 0.7–4.0)
MCH: 32.1 pg (ref 26.0–34.0)
MCHC: 34.1 g/dL (ref 30.0–36.0)
MCV: 94.1 fL (ref 80.0–100.0)
Monocytes Absolute: 0.7 10*3/uL (ref 0.1–1.0)
Monocytes Relative: 6 %
Neutro Abs: 6.9 10*3/uL (ref 1.7–7.7)
Neutrophils Relative %: 59 %
Platelets: 357 10*3/uL (ref 150–400)
RBC: 3.9 MIL/uL — ABNORMAL LOW (ref 4.22–5.81)
RDW: 17.4 % — ABNORMAL HIGH (ref 11.5–15.5)
WBC: 11.5 10*3/uL — ABNORMAL HIGH (ref 4.0–10.5)
nRBC: 0 % (ref 0.0–0.2)

## 2021-04-19 LAB — URINALYSIS, ROUTINE W REFLEX MICROSCOPIC
Bilirubin Urine: NEGATIVE
Glucose, UA: NEGATIVE mg/dL
Hgb urine dipstick: NEGATIVE
Ketones, ur: NEGATIVE mg/dL
Leukocytes,Ua: NEGATIVE
Nitrite: NEGATIVE
Protein, ur: NEGATIVE mg/dL
Specific Gravity, Urine: 1.005 (ref 1.005–1.030)
pH: 6 (ref 5.0–8.0)

## 2021-04-19 LAB — C-REACTIVE PROTEIN: CRP: 4.5 mg/dL — ABNORMAL HIGH (ref <1.0)

## 2021-04-19 LAB — PROTIME-INR
INR: 1 (ref 0.8–1.2)
Prothrombin Time: 13.6 seconds (ref 11.4–15.2)

## 2021-04-19 LAB — LACTIC ACID, PLASMA
Lactic Acid, Venous: 1.1 mmol/L (ref 0.5–1.9)
Lactic Acid, Venous: 0.8 mmol/L (ref 0.5–1.9)

## 2021-04-19 LAB — CBC
HCT: 35 % — ABNORMAL LOW (ref 39.0–52.0)
Hemoglobin: 11.8 g/dL — ABNORMAL LOW (ref 13.0–17.0)
MCH: 31.7 pg (ref 26.0–34.0)
MCHC: 33.7 g/dL (ref 30.0–36.0)
MCV: 94.1 fL (ref 80.0–100.0)
Platelets: 348 10*3/uL (ref 150–400)
RBC: 3.72 MIL/uL — ABNORMAL LOW (ref 4.22–5.81)
RDW: 17.5 % — ABNORMAL HIGH (ref 11.5–15.5)
WBC: 12.7 10*3/uL — ABNORMAL HIGH (ref 4.0–10.5)
nRBC: 0 % (ref 0.0–0.2)

## 2021-04-19 LAB — RESP PANEL BY RT-PCR (FLU A&B, COVID) ARPGX2
Influenza A by PCR: NEGATIVE
Influenza B by PCR: NEGATIVE
SARS Coronavirus 2 by RT PCR: NEGATIVE

## 2021-04-19 LAB — SEDIMENTATION RATE: Sed Rate: 40 mm/hr — ABNORMAL HIGH (ref 0–16)

## 2021-04-19 LAB — CREATININE, SERUM
Creatinine, Ser: 1.62 mg/dL — ABNORMAL HIGH (ref 0.61–1.24)
GFR, Estimated: 56 mL/min — ABNORMAL LOW (ref >60)

## 2021-04-19 IMAGING — DX DG CHEST 1V PORT
2 series · 2 of 2 positions shown · non-contrast
Comparison: Portable chest [DATE] and earlier.

CLINICAL DATA: 34-year-old male with pain.

EXAM:
PORTABLE CHEST 1 VIEW

[chest ap (1 of 2)]
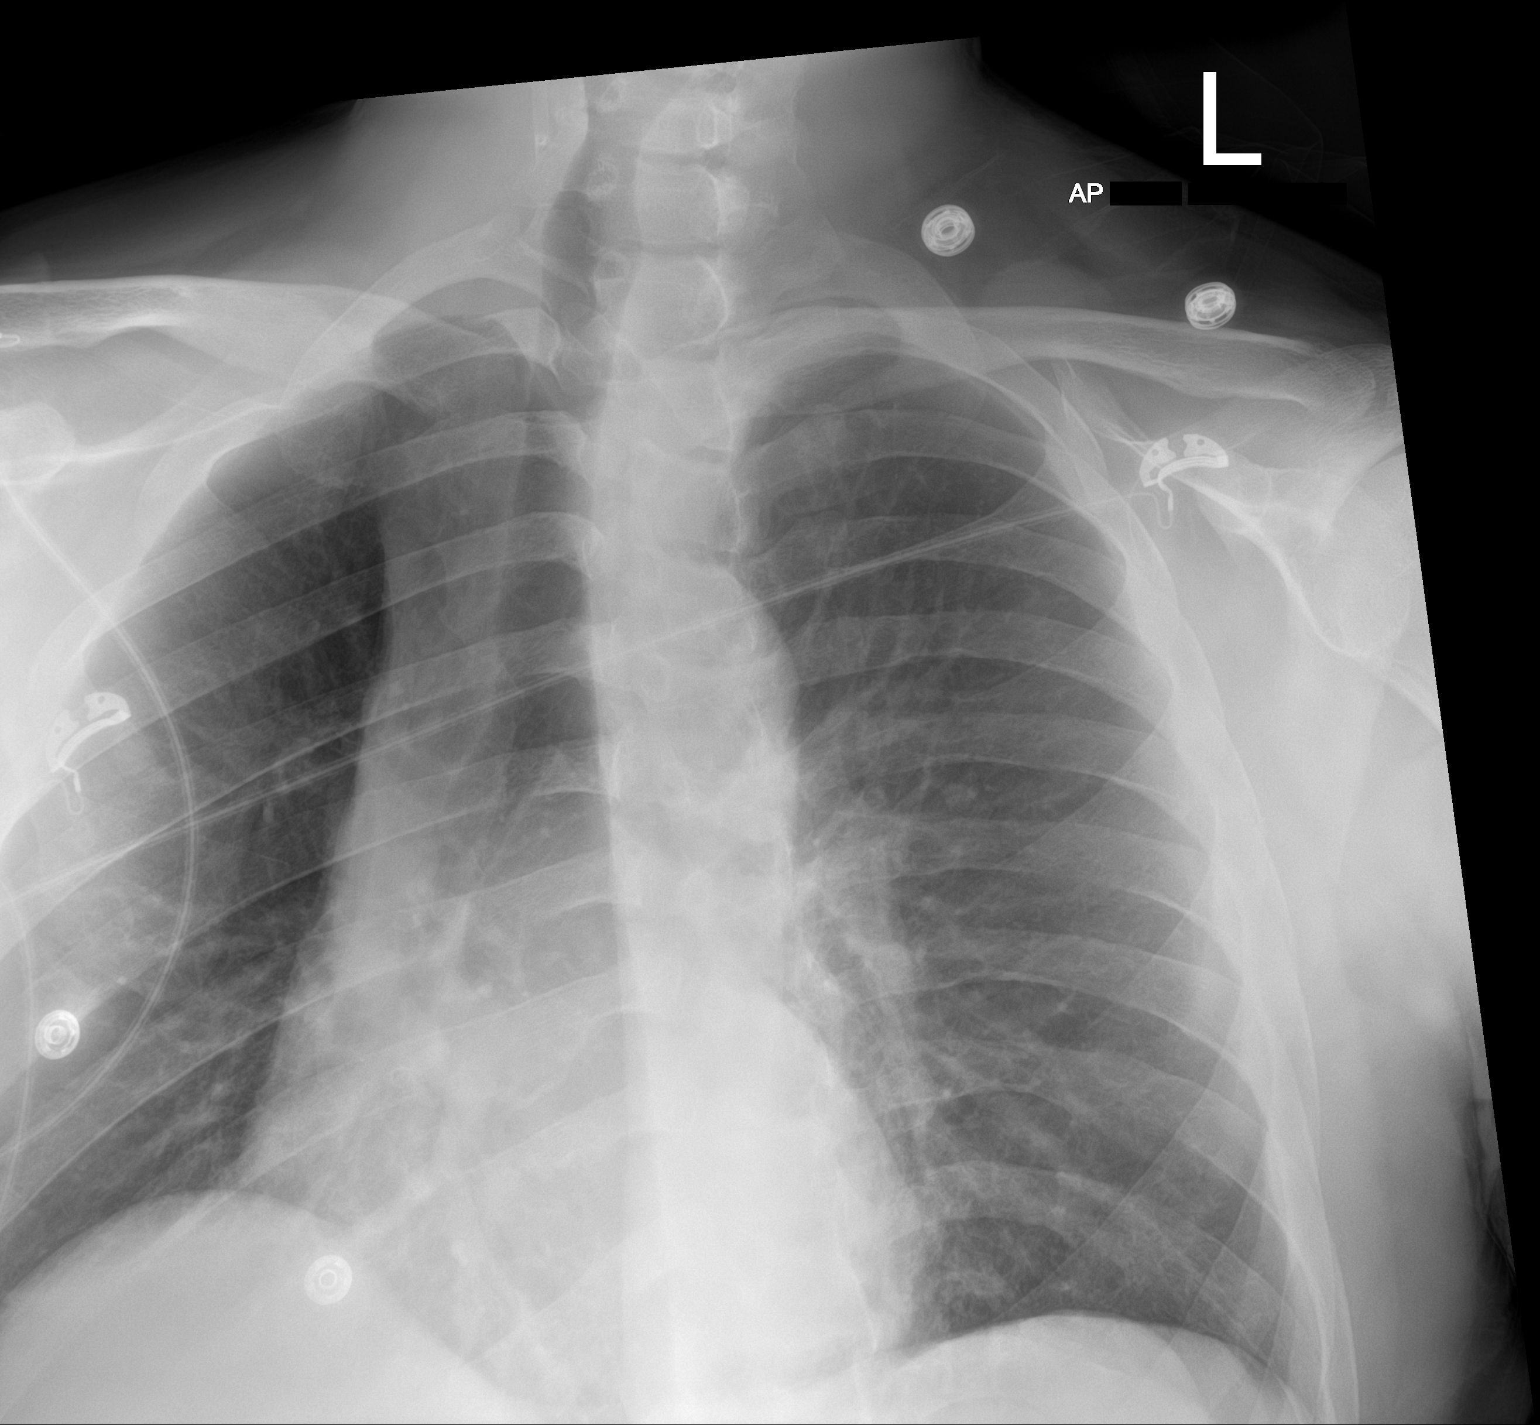

[chest ap (2 of 2)]
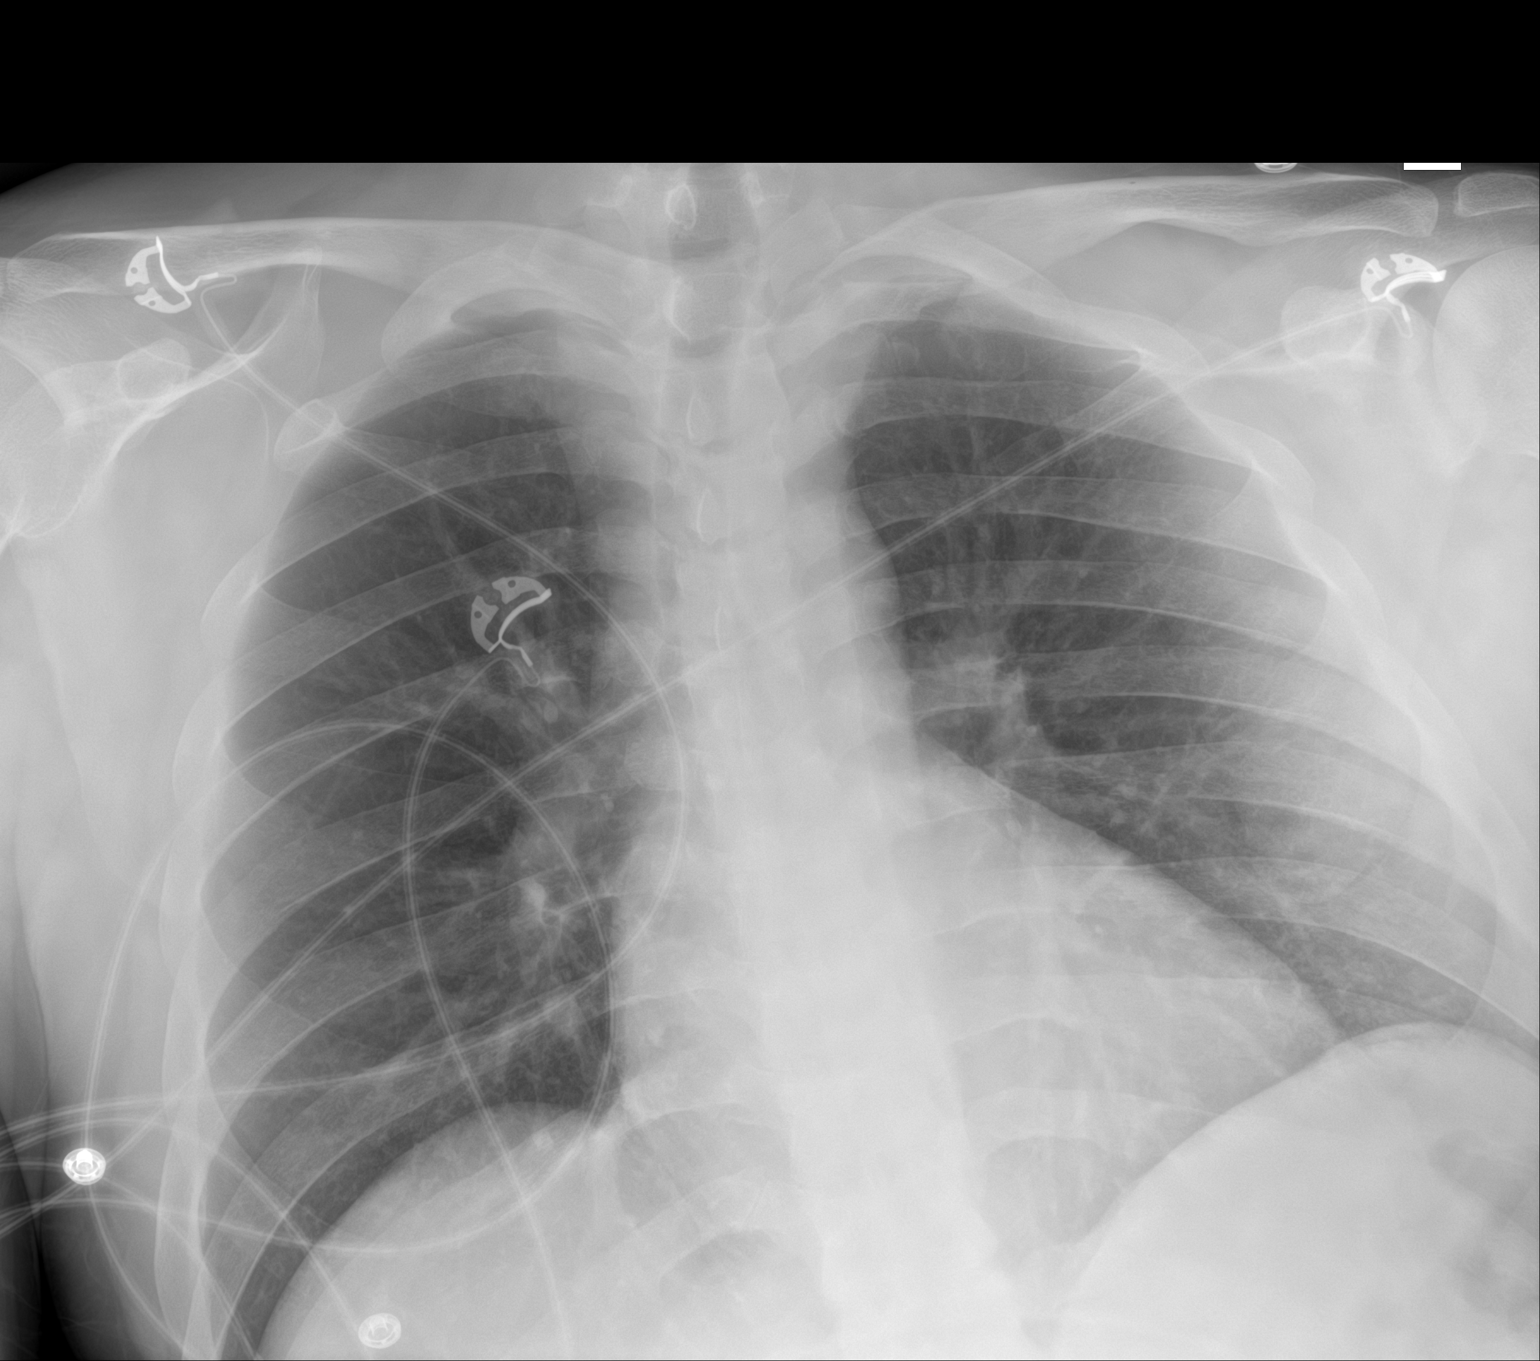

[2 of 2 positions shown; findings below may reference images not displayed]

FINDINGS: Portable AP semi upright views at [4N] hours. Lung volumes and
mediastinal contours are within normal limits. Visualized tracheal
air column is within normal limits. Allowing for portable technique
the lungs are clear.

No pneumothorax. No pleural effusion is evident. No osseous
abnormality identified.
IMPRESSION: Negative portable chest.

## 2021-04-19 IMAGING — MR MR PELVIS WO/W CM
8 of 10 series · 35 of 48 positions shown · IV contrast (gadavist)
Comparison: CT pelvis dated [DATE]. MR abdomen and pelvis
dated [DATE].

CLINICAL DATA: Sacral pain. History of perirectal abscess and
sacral osteomyelitis.

EXAM:
MRI PELVIS WITHOUT AND WITH CONTRAST
TECHNIQUE: Multiplanar multisequence MR imaging of the pelvis was performed
both before and after administration of intravenous contrast.
CONTRAST:  9.3mL GADAVIST GADOBUTROL 1 MMOL/ML IV SOLN

[Series 8: T2 · coronal · 5.0mm · 1.41mm/px · 4 of 34 slices shown]
[im 1/34]
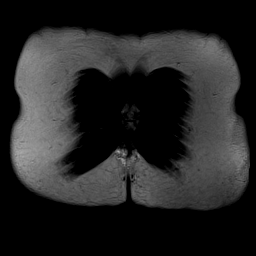
[im 12/34]
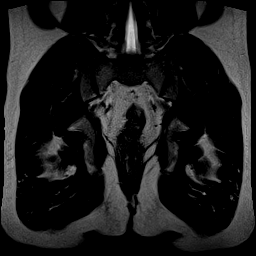
[im 23/34]
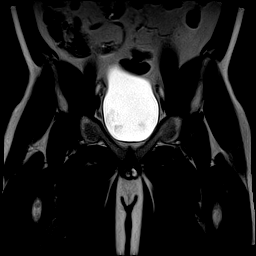
[im 34/34]
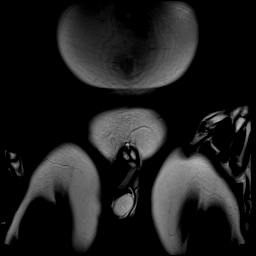

[Series 9: T1 · axial · 4.0mm · 0.74mm/px · z∈[-149,+116]mm · 6 of 54 slices shown (1 of 2)]
[im 1/54]
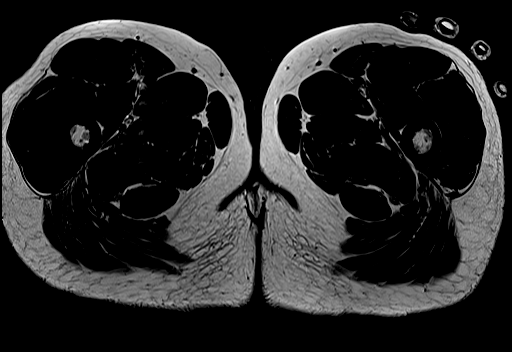
[im 11/54]
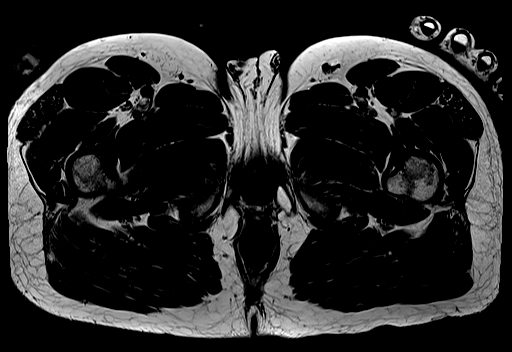
[im 22/54]
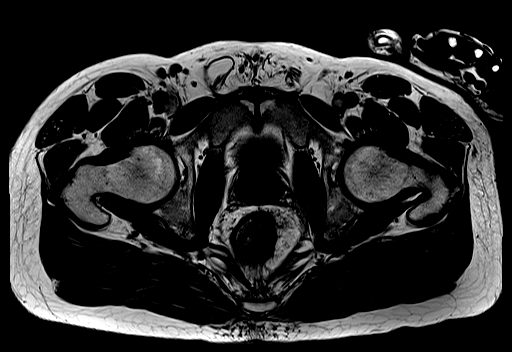
[im 32/54]
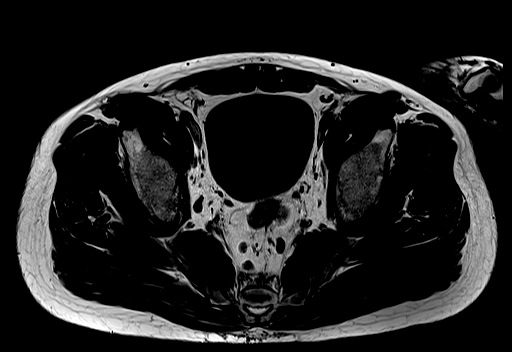
[im 43/54]
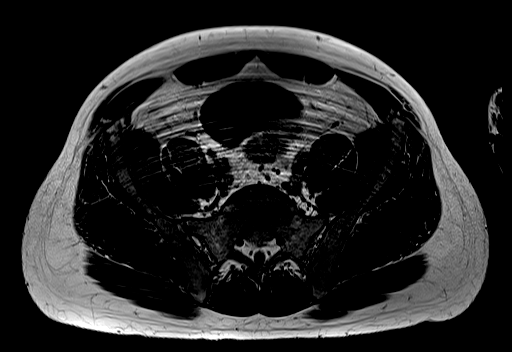
[im 54/54]
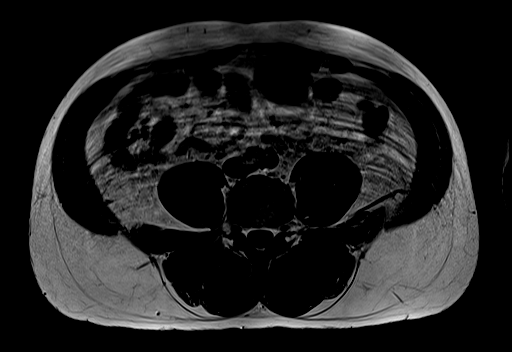

[Series 10: T2 fat-sat · sagittal · 4.0mm · 0.85mm/px · 6 of 63 slices shown (1 of 2)]
[im 1/63]
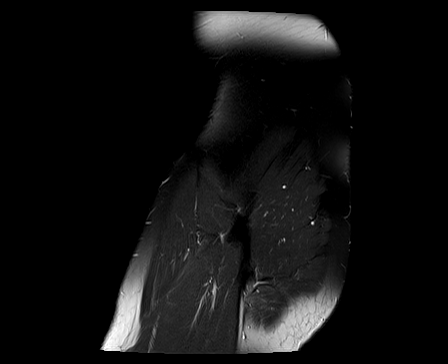
[im 13/63]
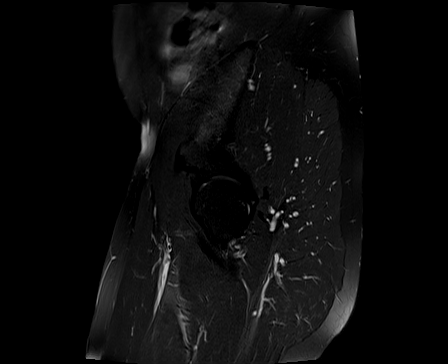
[im 25/63]
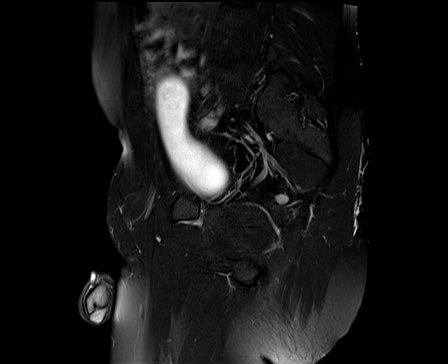
[im 38/63]
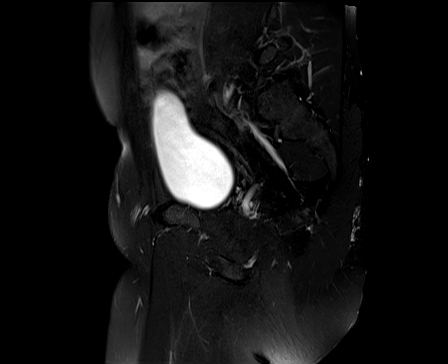
[im 50/63]
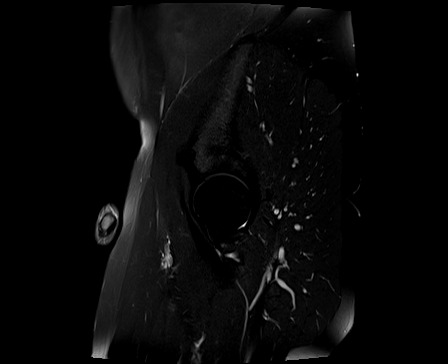
[im 63/63]
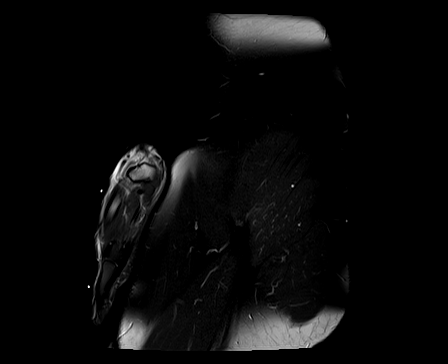

[Series 11: STIR · coronal · 4.0mm · 1.04mm/px · 4 of 36 slices shown]
[im 1/36]
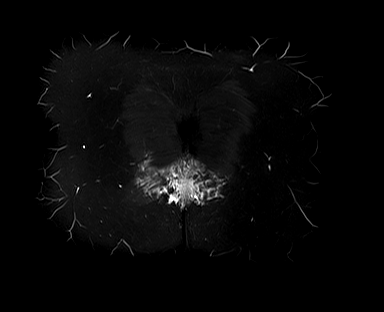
[im 12/36]
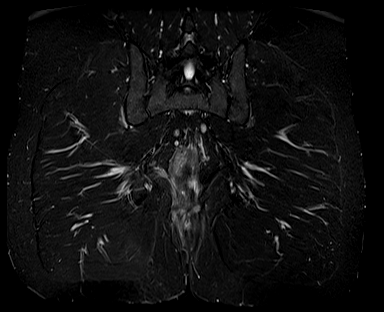
[im 24/36]
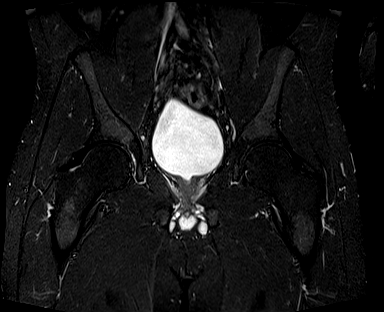
[im 36/36]
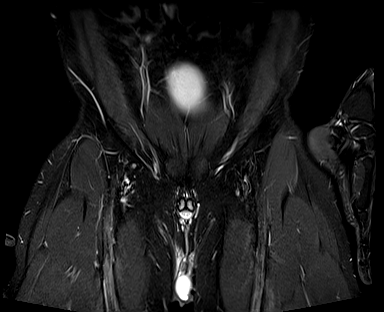

[Series 12: T1 · coronal · 4.0mm · 1.04mm/px · 4 of 36 slices shown (2 of 2)]
[im 1/36]
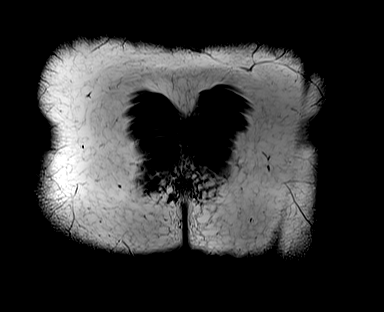
[im 12/36]
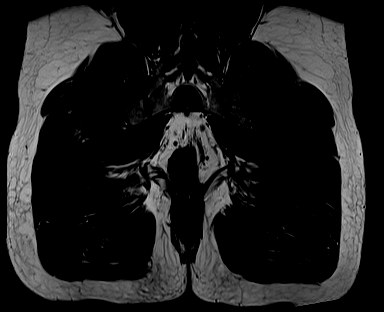
[im 24/36]
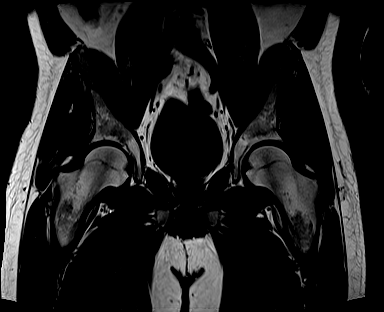
[im 36/36]
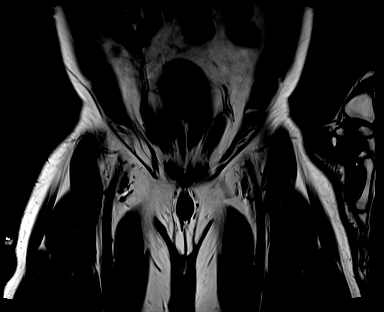

[Series 13: T2 fat-sat · axial · 4.0mm · 0.74mm/px · z∈[-149,+116]mm · 5 of 54 slices shown (2 of 2)]
[im 1/54]
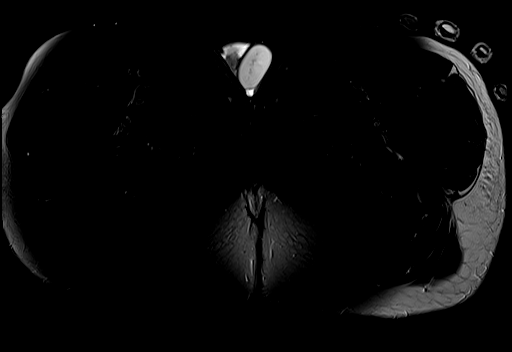
[im 14/54]
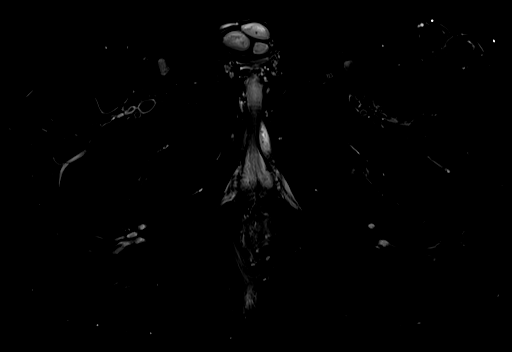
[im 27/54]
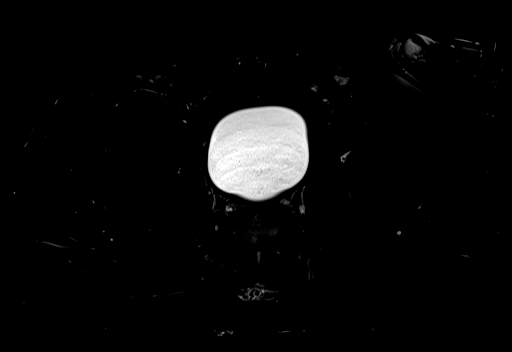
[im 40/54]
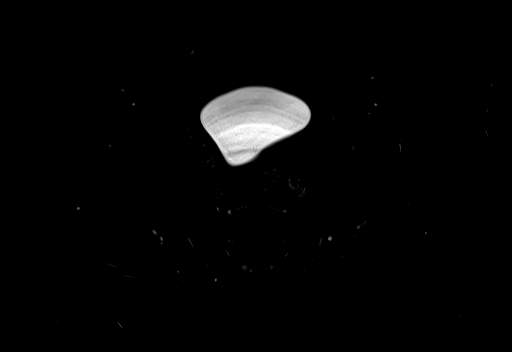
[im 54/54]
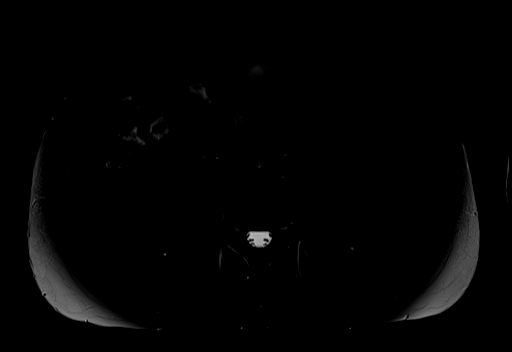

[Series 14: T1 fat-sat post-contrast · axial · non-contrast · 4.0mm · 0.99mm/px · z∈[-149,+116]mm · 5 of 54 slices shown (1 of 2)]
[im 1/54]
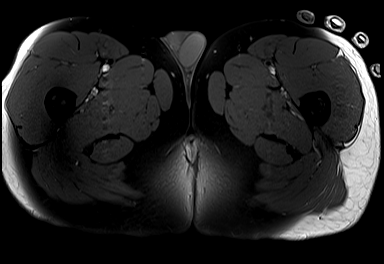
[im 14/54]
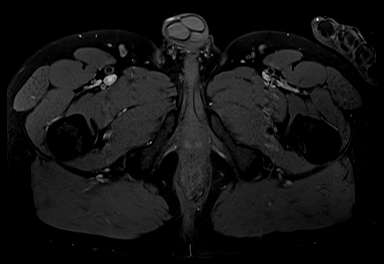
[im 27/54]
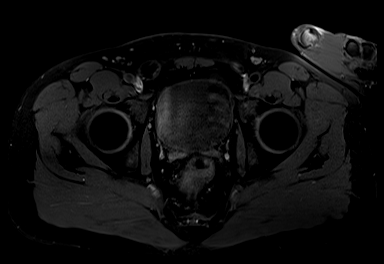
[im 40/54]
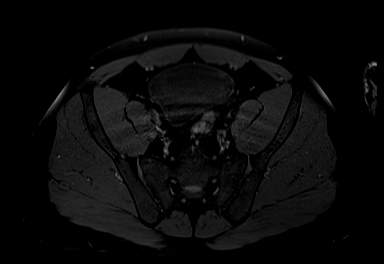
[im 54/54]
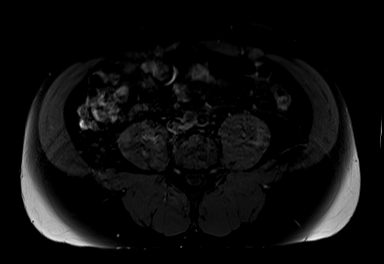

[Series 15: T1 fat-sat post-contrast · axial · 4.0mm · 0.99mm/px · 1 of 54 slices shown (2 of 2)]
[im 1/54]
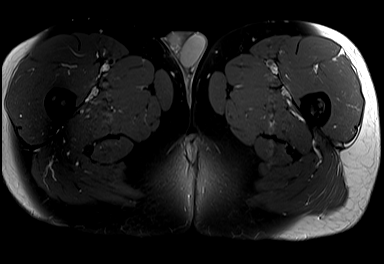

[35 of 48 positions shown; findings below may reference images not displayed]

FINDINGS: Bones/Joint/Cartilage

Abnormal marrow edema and enhancement of the lower coccyx with
prominent surrounding inflammatory changes. No fracture or
dislocation. Joint spaces are preserved. No joint effusion.

Muscles and Tendons
Intact.  No muscle edema or atrophy.

Soft tissue
Rectal wall thickening and perirectal inflammatory changes with
unchanged 1.9 x 0.7 cm gas and air-fluid collection along the left
posterolateral aspect of the rectum (4 o'clock position) above the
levator musculature (series 13, image 35). Again seen is a small
sinus tract from this fluid collection extending to a larger fluid
collection surrounding the coccyx measuring approximately 1.5 x
x 2.7 cm (series 13, image 39). Perirectal inflammatory changes have
overall decreased compared to the MRI in [facility], but pericoccygeal
inflammatory changes have increased.

Prominent subcentimeter perirectal lymph nodes are similar to prior
MRI.
IMPRESSION: 1. Progressive osteomyelitis involving the lower coccyx with 2.7 cm
abscess surrounding the coccyx that communicates with a small 1.9 cm
perirectal abscess through a small sinus tract.
2. Pericoccygeal inflammatory changes have worsened since [facility].
Perirectal inflammatory changes have improved since [DATE].

## 2021-04-19 MED ORDER — HYDROMORPHONE HCL 1 MG/ML IJ SOLN
0.5000 mg | INTRAMUSCULAR | Status: DC | PRN
  Administered 2021-04-21: 0.5 mg via INTRAVENOUS
  Filled 2021-04-19: qty 0.5

## 2021-04-19 MED ORDER — ACETAMINOPHEN 325 MG PO TABS
650.0000 mg | ORAL_TABLET | Freq: Four times a day (QID) | ORAL | Status: DC | PRN
  Administered 2021-04-19 – 2021-04-20 (×3): 650 mg via ORAL
  Filled 2021-04-19 (×3): qty 2

## 2021-04-19 MED ORDER — POLYETHYLENE GLYCOL 3350 17 G PO PACK: 17.0000 g | PACK | Freq: Every day | ORAL | Status: DC | PRN

## 2021-04-19 MED ORDER — VANCOMYCIN HCL 2000 MG/400ML IV SOLN
2000.0000 mg | Freq: Once | INTRAVENOUS | Status: AC
  Administered 2021-04-19: 2000 mg via INTRAVENOUS
  Filled 2021-04-19: qty 400

## 2021-04-19 MED ORDER — ONDANSETRON HCL 4 MG/2ML IJ SOLN: 4.0000 mg | Freq: Four times a day (QID) | INTRAMUSCULAR | Status: DC | PRN

## 2021-04-19 MED ORDER — OXYCODONE HCL 5 MG PO TABS
5.0000 mg | ORAL_TABLET | ORAL | Status: DC | PRN
Start: 2021-04-19 — End: 2021-04-19

## 2021-04-19 MED ORDER — ENOXAPARIN SODIUM 40 MG/0.4ML IJ SOSY
40.0000 mg | PREFILLED_SYRINGE | Freq: Every day | INTRAMUSCULAR | Status: DC
  Administered 2021-04-19 – 2021-04-22 (×3): 40 mg via SUBCUTANEOUS
  Filled 2021-04-19 (×3): qty 0.4

## 2021-04-19 MED ORDER — ONDANSETRON HCL 4 MG PO TABS: 4.0000 mg | ORAL_TABLET | Freq: Four times a day (QID) | ORAL | Status: DC | PRN

## 2021-04-19 MED ORDER — MORPHINE SULFATE (PF) 4 MG/ML IV SOLN
4.0000 mg | Freq: Once | INTRAVENOUS | Status: AC
  Administered 2021-04-19: 4 mg via INTRAVENOUS
  Filled 2021-04-19: qty 1

## 2021-04-19 MED ORDER — VANCOMYCIN HCL 1250 MG/250ML IV SOLN
1250.0000 mg | Freq: Two times a day (BID) | INTRAVENOUS | Status: DC
  Administered 2021-04-19 – 2021-04-21 (×4): 1250 mg via INTRAVENOUS
  Filled 2021-04-19 (×7): qty 250

## 2021-04-19 MED ORDER — GADOBUTROL 1 MMOL/ML IV SOLN
9.3000 mL | Freq: Once | INTRAVENOUS | Status: AC | PRN
  Administered 2021-04-19: 9.3 mL via INTRAVENOUS

## 2021-04-19 MED ORDER — ACETAMINOPHEN 650 MG RE SUPP: 650.0000 mg | Freq: Four times a day (QID) | RECTAL | Status: DC | PRN

## 2021-04-19 MED ORDER — OXYCODONE HCL 5 MG PO TABS
5.0000 mg | ORAL_TABLET | ORAL | Status: DC | PRN
Start: 2021-04-19 — End: 2021-04-22
  Administered 2021-04-19 – 2021-04-22 (×10): 10 mg via ORAL
  Filled 2021-04-19 (×10): qty 2

## 2021-04-19 MED ORDER — PIPERACILLIN-TAZOBACTAM 3.375 G IVPB
3.3750 g | Freq: Three times a day (TID) | INTRAVENOUS | Status: DC
  Administered 2021-04-19 – 2021-04-20 (×5): 3.375 g via INTRAVENOUS
  Filled 2021-04-19 (×5): qty 50

## 2021-04-19 NOTE — ED Triage Notes (Signed)
Patient recently admitted with coccygeal osteomyelitis.  Patient now with increased pain in the tailbone, states that it hurts to sit.  Patient states that the pain is worse when he is trying to move around, but once he gets going it is not as bad.

## 2021-04-19 NOTE — ED Provider Notes (Signed)
[facility] EMERGENCY DEPARTMENT Provider Note   CSN: [id number] Arrival date & time: 04/19/21  0341     History Chief Complaint  Patient presents with   Wound Infection    Shawn [name] is a 35 y.o. male.  The history is provided by the patient and medical records.   35 year old male with history of hypertension, dilated cardiomyopathy, recent hospitalization for osteomyelitis of coccyx in May 2022, presenting to the ED with recurrent buttock pain.  States he had CT scan on 04/15/2021 that was concerning for recurrent/persistent osteomyelitis of the coccyx with surrounding abscess.  States on Friday, 04/17/21 his symptoms began to worsen and was encouraged to return to ED for admission.  He states he has not had any wounds to the buttock area, no drainage or bleeding but he does feel the pain internally as he did before.  Pain is worse with sitting upright on his butt, better with lying on the side or standing.  He has not noticed any fevers.  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   PO B12 initiated 12/31/20   Dilated cardiomyopathy ([place]) 05/18/2013   By catheterization 2014    Hypertension     Patient Active Problem List   Diagnosis Date Noted   Abnormal CT scan, colon    Rectal pain    Perirectal abscess 12/30/2020   Perirectal cellulitis    Fistula    Lower urinary tract infectious disease 06/04/2020   Renal lesion 06/04/2020   Proctitis 06/04/2020   Alcohol abuse 11/06/2019   Tobacco abuse 05/18/2013   Dilated cardiomyopathy ([place]) 05/18/2013   Abscess, perirectal, x2  05/18/2013   Chest pain at rest 11/20/2012   Hypertension 11/20/2012    Past Surgical History:  Procedure Laterality Date   BIOPSY  01/01/2021   Procedure: BIOPSY;  Surgeon: Ladene Artist, MD;  Location: [facility] ENDOSCOPY;  Service: Endoscopy;;   COLONOSCOPY WITH PROPOFOL N/A 01/01/2021   Procedure: COLONOSCOPY  WITH PROPOFOL;  Surgeon: Ladene Artist, MD;  Location[facility] ENDOSCOPY;  Service: Endoscopy;  Laterality: N/A;   LEFT AND RIGHT HEART CATHETERIZATION WITH CORONARY ANGIOGRAM N/A 11/21/2012   Procedure: LEFT AND RIGHT HEART CATHETERIZATION WITH CORONARY ANGIOGRAM;  Surgeon: Birdie Riddle, MD;  Location: [place] CATH LAB;  Service: Cardiovascular;  Laterality: N/A;   MANDIBLE FRACTURE SURGERY  2006       Family History  Problem Relation Age of Onset   Diabetes Mother    Hypertension Other     Social History   Tobacco Use   Smoking status: Every Day    Packs/day: 0.25    Types: Cigarettes   Smokeless tobacco: Never  Vaping Use   Vaping Use: Never used  Substance Use Topics   Alcohol use: Yes    Comment: occ   Drug use: No    Home Medications Prior to Admission medications   Medication Sig Start Date End Date Taking? Authorizing Provider  lisinopril (ZESTRIL) 10 MG tablet Take 1 tablet (10 mg total) by mouth daily. 11/18/20 03/19/21  Charlott Rakes, MD  vitamin B-12 1000 MCG tablet Take 1 tablet (1,000 mcg total) by mouth daily. 01/03/21   Sanjuan Dame, MD    Allergies    Shrimp [shellfish allergy]  Review of Systems   Review of Systems  Gastrointestinal:  Positive for rectal pain.  All other systems reviewed and are negative.  Physical Exam Updated Vital Signs BP Marland Kitchen)  143/87   Pulse 77   Temp 98.5 F (36.9 C) (Oral)   Resp 12   SpO2 100%   Physical Exam Vitals and nursing note reviewed.  Constitutional:      Appearance: He is well-developed.  HENT:     Head: Normocephalic and atraumatic.  Eyes:     Conjunctiva/sclera: Conjunctivae normal.     Pupils: Pupils are equal, round, and reactive to light.  Cardiovascular:     Rate and Rhythm: Normal rate and regular rhythm.     Heart sounds: Normal heart sounds.  Pulmonary:     Effort: Pulmonary effort is normal.     Breath sounds: Normal breath sounds.  Abdominal:     General: Bowel sounds are normal.      Palpations: Abdomen is soft.  Musculoskeletal:        General: Normal range of motion.     Cervical back: Normal range of motion.     Comments: Coccyx tender to touch without visible abscess or open wound, no drainage, no erythema/induration locally to the area  Skin:    General: Skin is warm and dry.  Neurological:     Mental Status: He is alert and oriented to person, place, and time.    ED Results / Procedures / Treatments   Labs (all labs ordered are listed, but only abnormal results are displayed) Labs Reviewed  COMPREHENSIVE METABOLIC PANEL - Abnormal; Notable for the following components:      Result Value   CO2 16 (*)    Glucose, Bld 107 (*)    Creatinine, Ser 1.58 (*)    GFR, Estimated 58 (*)    All other components within normal limits  CBC WITH DIFFERENTIAL/PLATELET - Abnormal; Notable for the following components:   WBC 11.5 (*)    RBC 3.90 (*)    Hemoglobin 12.5 (*)    HCT 36.7 (*)    RDW 17.4 (*)    All other components within normal limits  CULTURE, BLOOD (ROUTINE X 2)  CULTURE, BLOOD (ROUTINE X 2)  RESP PANEL BY RT-PCR (FLU A&B, COVID) ARPGX2  LACTIC ACID, PLASMA  PROTIME-INR  LACTIC ACID, PLASMA  URINALYSIS, ROUTINE W REFLEX MICROSCOPIC    EKG None  Radiology DG Chest Port 1 View  Result Date: 04/19/2021 CLINICAL DATA:  35 year old male with pain. EXAM: PORTABLE CHEST 1 VIEW COMPARISON:  Portable chest 10/25/2019 and earlier. FINDINGS: Portable AP semi upright views at 0436 hours. Lung volumes and mediastinal contours are within normal limits. Visualized tracheal air column is within normal limits. Allowing for portable technique the lungs are clear. No pneumothorax. No pleural effusion is evident. No osseous abnormality identified. IMPRESSION: Negative portable chest. Electronically Signed   By: Genevie Ann M.D.   On: 04/19/2021 04:57    IMPRESSION: 1. Redemonstrated circumferential thickening of the low rectum. At the left aspect of the low rectum, above  the levator musculature, there is a probable small air containing abscess cavity measuring 1.3 x 0.7 cm, better detailed by prior MR and previously fluid-filled, measuring approximately 3.0 x 0.9 cm at that time. Findings are consistent with persistent, although improved abscess. 2. Inflammatory fat stranding and contrast enhancement again extends into the posterior soft tissues and appears to involve the tip of the coccyx. As on prior examination, this is suspicious for osteomyelitis. 3. 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 Consider  repeat contrast enhanced MR of the pelvis to further evaluate. 4. Numerous prominent subcentimeter perirectal lymph nodes, unchanged and likely reactive.     Electronically Signed   By: Eddie Candle M.D.   On: 04/15/2021 15:13  Procedures Procedures   CRITICAL CARE Performed by: Larene Pickett   Total critical care time: 45 minutes  Critical care time was exclusive of separately billable procedures and treating other patients.  Critical care was necessary to treat or prevent imminent or life-threatening deterioration.  Critical care was time spent personally by me on the following activities: development of treatment plan with patient and/or surrogate as well as nursing, discussions with consultants, evaluation of patient's response to treatment, examination of patient, obtaining history from patient or surrogate, ordering and performing treatments and interventions, ordering and review of laboratory studies, ordering and review of radiographic studies, pulse oximetry and re-evaluation of patient's condition.   Medications Ordered in ED Medications  piperacillin-tazobactam (ZOSYN) IVPB 3.375 g (has no administration in time range)  vancomycin (VANCOREADY) IVPB 2000 mg/400 mL (has no  administration in time range)  vancomycin (VANCOREADY) IVPB 1250 mg/250 mL (has no administration in time range)  morphine 4 MG/ML injection 4 mg (4 mg Intravenous Given 04/19/21 0506)    ED Course  I have reviewed the triage vital signs and the nursing notes.  Pertinent labs & imaging results that were available during my care of the patient were reviewed by me and considered in my medical decision making (see chart for details).    MDM Rules/Calculators/A&P                           35 year old male here with rectal pain.  Admitted earlier in the year for osteomyelitis of the coccyx, completed 6 weeks of IV and oral therapy.  Had follow-up CT 04/15/2021 redemonstrating osteomyelitis and improved air containing abscess.  He is afebrile and nontoxic in appearance here.  There are no skin changes noted to coccygeal area on exam.  There is no ulceration, open wound, abscess, or drainage at present that I can visualize.  Prior regimen during last admission: ceftriaxone 2gm iv daily plus oral metronidazole 520m TID.  As symptoms recurrent/persistent, will broaden this to vanc/zosyn for now.  Likely will need ID to evaluate in the AM to weight in.  Will require admission for ongoing care.  Discussed with hospitalist, Dr. MLuna Fuse- will admit for ongoing care.  Final Clinical Impression(s) / ED Diagnoses Final diagnoses:  Osteomyelitis of coccyx ([facility]    Rx / DC Orders ED Discharge Orders     None        SLarene Pickett PA-C 04/19/21 0542    MMerrily Pew MD 04/19/21 [phone]

## 2021-04-19 NOTE — ED Notes (Signed)
Patient transported to MRI 

## 2021-04-19 NOTE — ED Notes (Signed)
Pt reports having been treated w/ antibiotics for infection on his bottom however the pain and infection is back. No other symptoms at this time.

## 2021-04-19 NOTE — ED Notes (Signed)
Went to MRI and established new IV access. Patient remains in MRI waiting bay at this time. No signs of distress.

## 2021-04-19 NOTE — H&P (Signed)
History and Physical  Patient Name: Shawn [name]     CZY:[id number]    DOB: 10/24/1985    DOA: 04/19/2021 PCP: Charlott Rakes, MD  Patient coming from: Home  Chief Complaint: Sacral pain    HPI: Shawn [name] is a 35 y.o. male, with PMH of perirectal abscesses, hypertension, tobacco use, previous osteomyelitis, hidradenitis, who presented to the ER on 04/19/2021 with sacral pain  Patient was hospitalized in May for perirectal abscess and early sacral osteomyelitis.  He was treated with 6 weeks of Rocephin and Flagyl and then switched to Augmentin.  He has been followed by GI and infectious disease.  He had a colonoscopy in May 2022 as well where pathology showed chronic colitis.  He was doing well however over the past 10 days or so, he has had recurrence of his sacral area pain.  He was seen by GI in the outpatient setting on 04/14/2021 and a CT abdomen pelvis was ordered.  This showed redemonstration of circumferential thickening of the lower rectum, persistent although improved abscess, inflammatory fat stranding and contrast-enhancement into the posterior soft tissues, suspicious for osteomyelitis.  His pain persisted, worse when laying on his buttocks.  He denies any drainage.  He is only taking ibuprofen at home.  Due to symptoms he presented to the ED.    ED course: -Vitals on admission: Afebrile, heart rate 82, blood pressure 128/90, respiratory rate 17, maintaining sats on room air -Labs on initial presentation: Sodium 137, potassium 4.1, chloride 108, bicarb 16, glucose 107, BUN 17, creatinine 1.5, WBC 11.5, hemoglobin 12.5, COVID-negative, UA unremarkable -Imaging obtained on admission: Chest x-ray unremarkable -In the ED the patient was given vancomycin, Zosyn, morphine, and the hospitalist service was contacted for further evaluation and management.     ROS: A complete and thorough 12 point review of systems obtained, negative listed in HPI.     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   PO B12 initiated 12/31/20   Dilated cardiomyopathy ([place]) 05/18/2013   By catheterization 2014    Hypertension     Past Surgical History:  Procedure Laterality Date   BIOPSY  01/01/2021   Procedure: BIOPSY;  Surgeon: Ladene Artist, MD;  Location: [facility] ENDOSCOPY;  Service: Endoscopy;;   COLONOSCOPY WITH PROPOFOL N/A 01/01/2021   Procedure: COLONOSCOPY WITH PROPOFOL;  Surgeon: Ladene Artist, MD;  Location: [facility] ENDOSCOPY;  Service: Endoscopy;  Laterality: N/A;   LEFT AND RIGHT HEART CATHETERIZATION WITH CORONARY ANGIOGRAM N/A 11/21/2012   Procedure: LEFT AND RIGHT HEART CATHETERIZATION WITH CORONARY ANGIOGRAM;  Surgeon: Birdie Riddle, MD;  Location: [place] CATH LAB;  Service: Cardiovascular;  Laterality: N/A;   MANDIBLE FRACTURE SURGERY  2006    Social History: Patient lives at home.  The patient walks without assistance.  Current smoker.  Allergies  Allergen Reactions   Shrimp [Shellfish Allergy] Shortness Of Breath    Family history: family history includes Diabetes in his mother; Hypertension in an other family member.  Prior to Admission medications   Medication Sig Start Date End Date Taking? Authorizing Provider  lisinopril (ZESTRIL) 10 MG tablet Take 1 tablet (10 mg total) by mouth daily. 11/18/20 03/19/21  Charlott Rakes, MD  vitamin B-12 1000 MCG tablet Take 1 tablet (1,000 mcg total) by mouth daily. 01/03/21   Sanjuan Dame, MD       Physical Exam: BP (!) 143/87   Pulse 77   Temp 98.5  F (36.9 C) (Oral)   Resp 12   SpO2 100%   General appearance: Well-developed, adult male, alert and in no acute distress .   Eyes: Anicteric, conjunctiva pink, lids and lashes normal. PERRL.    ENT: No nasal deformity, discharge, epistaxis.  Hearing intact. OP moist without lesions.   Neck: No neck masses.  Trachea midline.  No thyromegaly/tenderness. Lymph: No cervical or supraclavicular  lymphadenopathy. Cardiac: RRR, nl S1-S2, no murmurs appreciated.  No LE edema.  Radial and pedal pulses 2+ and symmetric. Respiratory: Normal respiratory rate and rhythm.  CTAB without rales or wheezes. Abdomen: Abdomen soft.  No tenderness with palpation. No ascites, distension, hepatosplenomegaly.   GU: no open wound seen MSK: No deformities or effusions of the large joints of the upper or lower extremities bilaterally.  No cyanosis or clubbing. Neuro: Cranial nerves 2 through 12 grossly intact.  Sensation intact to light touch. Speech is fluent.  Marland Kitchen    Psych: Sensorium intact and responding to questions, attention normal.  Behavior appropriate.  Judgment and insight appear normal.    Labs on Admission:  I have personally reviewed following labs and imaging studies: CBC: Recent Labs  Lab 04/14/21 1544 04/19/21 0408  WBC 9.1 11.5*  NEUTROABS 6.5 6.9  HGB 12.2* 12.5*  HCT 36.7* 36.7*  MCV 97.5 94.1  PLT 278.0 976   Basic Metabolic Panel: Recent Labs  Lab 04/14/21 1544 04/19/21 0408  NA 138 137  K 4.1 4.1  CL 108 108  CO2 21 16*  GLUCOSE 84 107*  BUN 21 17  CREATININE 1.44 1.58*  CALCIUM 9.2 9.1   GFR: Estimated Creatinine Clearance: 73 mL/min (A) (by C-G formula based on SCr of 1.58 mg/dL (H)).  Liver Function Tests: Recent Labs  Lab 04/14/21 1544 04/19/21 0408  AST 19 22  ALT 26 31  ALKPHOS 99 93  BILITOT 0.4 0.7  PROT 8.2 8.0  ALBUMIN 3.9 3.5   No results for input(s): LIPASE, AMYLASE in the last 168 hours. No results for input(s): AMMONIA in the last 168 hours. Coagulation Profile: Recent Labs  Lab 04/19/21 0408  INR 1.0   Cardiac Enzymes: No results for input(s): CKTOTAL, CKMB, CKMBINDEX, TROPONINI in the last 168 hours. BNP (last 3 results) No results for input(s): PROBNP in the last 8760 hours. HbA1C: No results for input(s): HGBA1C in the last 72 hours. CBG: No results for input(s): GLUCAP in the last 168 hours. Lipid Profile: No results for  input(s): CHOL, HDL, LDLCALC, TRIG, CHOLHDL, LDLDIRECT in the last 72 hours. Thyroid Function Tests: No results for input(s): TSH, T4TOTAL, FREET4, T3FREE, THYROIDAB in the last 72 hours. Anemia Panel: No results for input(s): VITAMINB12, FOLATE, FERRITIN, TIBC, IRON, RETICCTPCT in the last 72 hours.   Recent Results (from the past 240 hour(s))  Resp Panel by RT-PCR (Flu A&B, Covid) Nasopharyngeal Swab     Status: None   Collection Time: 04/19/21  5:12 AM   Specimen: Nasopharyngeal Swab; Nasopharyngeal(NP) swabs in vial transport medium  Result Value Ref Range Status   SARS Coronavirus 2 by RT PCR NEGATIVE NEGATIVE Final    Comment: (NOTE) SARS-CoV-2 target nucleic acids are NOT DETECTED.  The SARS-CoV-2 RNA is generally detectable in upper respiratory specimens during the acute phase of infection. The lowest concentration of SARS-CoV-2 viral copies this assay can detect is 138 copies/mL. A negative result does not preclude SARS-Cov-2 infection and should not be used as the sole basis for treatment or other patient management decisions. A  negative result may occur with  improper specimen collection/handling, submission of specimen other than nasopharyngeal swab, presence of viral mutation(s) within the areas targeted by this assay, and inadequate number of viral copies(<138 copies/mL). A negative result must be combined with clinical observations, patient history, and epidemiological information. The expected result is Negative.  Fact Sheet for Patients:  EntrepreneurPulse.com.au  Fact Sheet for Healthcare Providers:  IncredibleEmployment.be  This test is no t yet approved or cleared by the [place] FDA and  has been authorized for detection and/or diagnosis of SARS-CoV-2 by FDA under an Emergency Use Authorization (EUA). This EUA will remain  in effect (meaning this test can be used) for the duration of the COVID-19 declaration under  Section 564(b)(1) of the Act, 21 U.S.C.section 360bbb-3(b)(1), unless the authorization is terminated  or revoked sooner.       Influenza A by PCR NEGATIVE NEGATIVE Final   Influenza B by PCR NEGATIVE NEGATIVE Final    Comment: (NOTE) The Xpert Xpress SARS-CoV-2/FLU/RSV plus assay is intended as an aid in the diagnosis of influenza from Nasopharyngeal swab specimens and should not be used as a sole basis for treatment. Nasal washings and aspirates are unacceptable for Xpert Xpress SARS-CoV-2/FLU/RSV testing.  Fact Sheet for Patients: EntrepreneurPulse.com.au  Fact Sheet for Healthcare Providers: IncredibleEmployment.be  This test is not yet approved or cleared by the [place] FDA and has been authorized for detection and/or diagnosis of SARS-CoV-2 by FDA under an Emergency Use Authorization (EUA). This EUA will remain in effect (meaning this test can be used) for the duration of the COVID-19 declaration under Section 564(b)(1) of the Act, 21 U.S.C. section 360bbb-3(b)(1), unless the authorization is terminated or revoked.  Performed at [facility], [place] [address] [place], [place] [address]            Radiological Exams on Admission: Personally reviewed imaging which shows: Chest x-ray unremarkable DG Chest Port 1 View  Result Date: 04/19/2021 CLINICAL DATA:  36 year old male with pain. EXAM: PORTABLE CHEST 1 VIEW COMPARISON:  Portable chest 10/25/2019 and earlier. FINDINGS: Portable AP semi upright views at 0436 hours. Lung volumes and mediastinal contours are within normal limits. Visualized tracheal air column is within normal limits. Allowing for portable technique the lungs are clear. No pneumothorax. No pleural effusion is evident. No osseous abnormality identified. IMPRESSION: Negative portable chest. Electronically Signed   By: Genevie Ann M.D.   On: 04/19/2021 04:57           Assessment/Plan   1.  Concern for  coccyx osteomyelitis, colitis, pelvic abscess -History of sacral osteomyelitis, colitis, and pelvic abscess in May 2022.  Treated with prolonged course of antibiotics -CT pelvis on 04/15/2021 showed redemonstration of circumferential thickening of the lower rectum, persistent although improved abscess, inflammatory fat stranding and contrast-enhancement into the posterior soft tissues, suspicious for osteomyelitis. - Started on vancomycin and Zosyn, continue - MRI of the pelvis ordered - ESR and CRP ordered - Consider ID consult in the morning - Consider GI consult in the morning  2.  Essential hypertension -Blood pressure currently stable -Awaiting home medication reconciliation   3.  Chronic kidney disease stage III -Slightly above baseline - We will monitor for now, follow-up labs ordered  4.  Metabolic acidosis - On admission bicarb 16 - Specific etiology unknown - We will repeat labs and if persistent, will obtain ABG    DVT prophylaxis: Lovenox Code Status: Full Family Communication: None Disposition Plan: Anticipate discharge home when medically  optimized Consults called: None Admission status: Inpatient      Medical decision making: Patient seen at [place] AM on 04/19/2021.  The patient was discussed with ER provider.  What exists of the patient's chart was reviewed in depth and summarized above.  Clinical condition: Fair.        Doran Heater [facility] Please page though [place] or Epic secure chat:  For password, contact charge nurse

## 2021-04-19 NOTE — Consult Note (Signed)
[place] [facility]       Reason for Consult:osteomyelitis    Referring Physician: Dr. Posey Pronto  Active Problems:   Hypertension   Tobacco abuse   Perirectal abscess   Abnormal CT scan, colon   Osteomyelitis (HCC)   Metabolic acidosis    enoxaparin (LOVENOX) injection  40 mg Subcutaneous Daily    Recommendations:  Continue antibiotics Surgical evaluation for drainage, further management  Assessment: He has worsening osteomyelitis and more abscess noted despite recent prolonged antibiotics.     Antibiotics: Vancomcyin and piperacillin/tazobactam  HPI: Shawn [name] is a 35 y.o. 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  MRI now was repeated and noted progression of the coccyx osteomyelitis with a 2.7 cm abscess around the coccyx communicating with a small 1.9 cm perirectal abscess.  He was started empirically on broad spectrum antibiotics.  No fever, no chills. WBC 11.5, afebrile.    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   PO B12 initiated 12/31/20   Dilated cardiomyopathy ([place]) 05/18/2013   By catheterization 2014    Hypertension     Social History   Tobacco Use   Smoking status: Every Day    Packs/day: 0.25    Types: Cigarettes   Smokeless tobacco: Never  Vaping Use   Vaping Use: Never used  Substance Use Topics   Alcohol use: Yes    Comment: occ   Drug use: No    Family History  Problem Relation Age of Onset   Diabetes Mother    Hypertension Other     Allergies  Allergen Reactions   Shrimp [Shellfish Allergy] Shortness Of Breath    Physical Exam: Constitutional:  in no apparent distress  Vitals:   04/19/21 1230 04/19/21 1245  BP:    Pulse: 80 90  Resp: 15 17  Temp:    SpO2: 100% 100%   EYES: anicteric ENMT: Cardiovascular: Cor RRR Respiratory: normal respiratory effort Musculoskeletal: no pedal edema noted Skin: negatives: no rash   Lab Results  Component Value Date   WBC 11.5 (H) 04/19/2021   HGB 12.5 (L) 04/19/2021   HCT 36.7 (L) 04/19/2021   MCV 94.1 04/19/2021   PLT 357 04/19/2021    Lab Results  Component Value Date   CREATININE 1.58 (H) 04/19/2021   BUN 17 04/19/2021   NA 137 04/19/2021   K 4.1 04/19/2021   CL 108 04/19/2021   CO2 16 (L) 04/19/2021    Lab Results  Component Value Date   ALT 31 04/19/2021   AST 22 04/19/2021   ALKPHOS 93 04/19/2021     Microbiology: Recent Results (from the past 240 hour(s))  Resp Panel by RT-PCR (Flu A&B, Covid) Nasopharyngeal Swab     Status: None   Collection Time: 04/19/21  5:12 AM   Specimen: Nasopharyngeal Swab; Nasopharyngeal(NP) swabs in vial transport medium  Result Value Ref Range Status   SARS Coronavirus 2 by RT PCR NEGATIVE NEGATIVE Final    Comment: (NOTE) SARS-CoV-2 target nucleic acids are NOT DETECTED.  The SARS-CoV-2 RNA is generally detectable in upper respiratory specimens during the acute phase of infection. The lowest concentration of SARS-CoV-2 viral  copies this assay can detect is 138 copies/mL. A negative result does not preclude SARS-Cov-2 infection and should not be used as the sole basis for treatment or other patient management decisions. A negative result may occur with  improper specimen collection/handling, submission of specimen other than nasopharyngeal swab, presence of viral mutation(s) within the areas targeted by this assay, and inadequate number of viral copies(<138 copies/mL). A negative result must be combined with clinical observations, patient history, and epidemiological information. The expected result is Negative.  Fact Sheet  for Patients:  EntrepreneurPulse.com.au  Fact Sheet for Healthcare Providers:  IncredibleEmployment.be  This test is no t yet approved or cleared by the [place] FDA and  has been authorized for detection and/or diagnosis of SARS-CoV-2 by FDA under an Emergency Use Authorization (EUA). This EUA will remain  in effect (meaning this test can be used) for the duration of the COVID-19 declaration under Section 564(b)(1) of the Act, 21 U.S.C.section 360bbb-3(b)(1), unless the authorization is terminated  or revoked sooner.       Influenza A by PCR NEGATIVE NEGATIVE Final   Influenza B by PCR NEGATIVE NEGATIVE Final    Comment: (NOTE) The Xpert Xpress SARS-CoV-2/FLU/RSV plus assay is intended as an aid in the diagnosis of influenza from Nasopharyngeal swab specimens and should not be used as a sole basis for treatment. Nasal washings and aspirates are unacceptable for Xpert Xpress SARS-CoV-2/FLU/RSV testing.  Fact Sheet for Patients: EntrepreneurPulse.com.au  Fact Sheet for Healthcare Providers: IncredibleEmployment.be  This test is not yet approved or cleared by the [place] FDA and has been authorized for detection and/or diagnosis of SARS-CoV-2 by FDA under an Emergency Use Authorization (EUA). This EUA will remain in effect (meaning this test can be used) for the duration of the COVID-19 declaration under Section 564(b)(1) of the Act, 21 U.S.C. section 360bbb-3(b)(1), unless the authorization is terminated or revoked.  Performed at [facility], [place] [address] [place], [place] [address]     Shawn [name], Shawn [name] for Infectious Disease [facility] [email or web address] 04/19/2021, 2:50 PM

## 2021-04-19 NOTE — Progress Notes (Signed)
[facility] PLAN OF CARE NOTE Patient: Shawn [name] TRV:[id number]   PCP: Charlott Rakes, MD DOB: 28-Oct-1985   DOA: 04/19/2021   DOS: 04/19/2021    Patient was admitted by my colleague earlier on 04/19/2021. I have reviewed the H&P as well as assessment and plan and agree with the same. Important changes in the plan are listed below.  Plan of care: Active Problems:   Hypertension   Tobacco abuse   Perirectal abscess   Abnormal CT scan, colon   Osteomyelitis (HCC)   Metabolic acidosis Will consult GI.  Will consult ID.  May require general surgery consultation. Patient was initially threatening to leave AMA secondary to pain control issues.  Now currently agreeable for therapy in the hospital.  Author: Berle Mull, MD [facility] 04/19/2021 6:02 PM   If 7PM-7AM, please contact night-coverage at [email or web address]

## 2021-04-19 NOTE — Plan of Care (Signed)
  Problem: Education: Goal: Knowledge of General Education information will improve Description: Including pain rating scale, medication(s)/side effects and non-pharmacologic comfort measures Outcome: Progressing   Problem: Activity: Goal: Risk for activity intolerance will decrease Outcome: Progressing   

## 2021-04-19 NOTE — ED Notes (Signed)
Patient remains in MRI

## 2021-04-19 NOTE — ED Notes (Signed)
Went in to introduce myself to patient and assess him. Noted that he had stopped the pump,  removed his own IV from his hand and was dressed and stating he wanted to leave. I explained that he needed his IV antibiotics and it would be recommended he stay until a doctor feels he is ready to be released. Talked patient in to staying. He stated "the pain medication they gave me isn't working and if I can get pain medication I will stay". MD called in to speak with patient.

## 2021-04-19 NOTE — Progress Notes (Signed)
Pharmacy Antibiotic Note  Shawn [name] is a 35 y.o. male admitted on 04/19/2021 with  possible recurrent osteomyelitis of the coccyx .  Pharmacy has been consulted for Vancomycin/Zosyn dosing. WBC 11.5. Mild bump in Scr. He recently finished 6 weeks of IV anti-biotics in June.  Plan: Vancomycin 2000 mg IV x 1, then 1250 mg IV q12h >>Estimated AUC: 574 Zosyn 3.375G IV q8h to be infused over 4 hours Trend WBC, temp, renal function  F/U infectious work-up Drug levels as indicated      Temp (24hrs), Avg:98.5 F (36.9 C), Min:98.5 F (36.9 C), Max:98.5 F (36.9 C)  Recent Labs  Lab 04/14/21 1544 04/19/21 0408  WBC 9.1 11.5*  CREATININE 1.44 1.58*  LATICACIDVEN  --  1.1    Estimated Creatinine Clearance: 73 mL/min (A) (by C-G formula based on SCr of 1.58 mg/dL (H)).    Allergies  Allergen Reactions   Shrimp [Shellfish Allergy] Shortness Of Breath    Narda Bonds, PharmD, BCPS Clinical Pharmacist Phone: [phone]

## 2021-04-19 NOTE — ED Notes (Signed)
Pt in MRI, will get vitals once he returns

## 2021-04-20 ENCOUNTER — Encounter (HOSPITAL_COMMUNITY): Payer: Self-pay | Admitting: Internal Medicine

## 2021-04-20 DIAGNOSIS — M869 Osteomyelitis, unspecified: Secondary | ICD-10-CM | POA: Diagnosis not present

## 2021-04-20 DIAGNOSIS — K611 Rectal abscess: Secondary | ICD-10-CM | POA: Diagnosis not present

## 2021-04-20 LAB — COMPREHENSIVE METABOLIC PANEL
ALT: 25 U/L (ref 0–44)
AST: 17 U/L (ref 15–41)
Albumin: 3.1 g/dL — ABNORMAL LOW (ref 3.5–5.0)
Alkaline Phosphatase: 90 U/L (ref 38–126)
Anion gap: 5 (ref 5–15)
BUN: 17 mg/dL (ref 6–20)
CO2: 21 mmol/L — ABNORMAL LOW (ref 22–32)
Calcium: 8.5 mg/dL — ABNORMAL LOW (ref 8.9–10.3)
Chloride: 111 mmol/L (ref 98–111)
Creatinine, Ser: 1.62 mg/dL — ABNORMAL HIGH (ref 0.61–1.24)
GFR, Estimated: 56 mL/min — ABNORMAL LOW (ref >60)
Glucose, Bld: 91 mg/dL (ref 70–99)
Potassium: 4.1 mmol/L (ref 3.5–5.1)
Sodium: 137 mmol/L (ref 135–145)
Total Bilirubin: 0.8 mg/dL (ref 0.3–1.2)
Total Protein: 7.5 g/dL (ref 6.5–8.1)

## 2021-04-20 LAB — CBC
HCT: 33.6 % — ABNORMAL LOW (ref 39.0–52.0)
Hemoglobin: 11.6 g/dL — ABNORMAL LOW (ref 13.0–17.0)
MCH: 32.6 pg (ref 26.0–34.0)
MCHC: 34.5 g/dL (ref 30.0–36.0)
MCV: 94.4 fL (ref 80.0–100.0)
Platelets: 324 10*3/uL (ref 150–400)
RBC: 3.56 MIL/uL — ABNORMAL LOW (ref 4.22–5.81)
RDW: 17.5 % — ABNORMAL HIGH (ref 11.5–15.5)
WBC: 11.8 10*3/uL — ABNORMAL HIGH (ref 4.0–10.5)
nRBC: 0 % (ref 0.0–0.2)

## 2021-04-20 LAB — MAGNESIUM: Magnesium: 2.1 mg/dL (ref 1.7–2.4)

## 2021-04-20 LAB — PHOSPHORUS: Phosphorus: 3.8 mg/dL (ref 2.5–4.6)

## 2021-04-20 MED ORDER — METRONIDAZOLE 500 MG/100ML IV SOLN
500.0000 mg | Freq: Two times a day (BID) | INTRAVENOUS | Status: DC
  Administered 2021-04-20 – 2021-04-22 (×3): 500 mg via INTRAVENOUS
  Filled 2021-04-20 (×3): qty 100

## 2021-04-20 MED ORDER — SODIUM CHLORIDE 0.9 % IV SOLN
2.0000 g | Freq: Three times a day (TID) | INTRAVENOUS | Status: DC
  Administered 2021-04-20 – 2021-04-22 (×5): 2 g via INTRAVENOUS
  Filled 2021-04-20 (×7): qty 2

## 2021-04-20 NOTE — Consult Note (Addendum)
[facility] Consult: 11:46 AM 04/20/2021  LOS: 1 day    Referring Provider: Dr Marlowe Sax  Primary Care Physician:  Charlott Rakes, MD Primary Gastroenterologist:  Dr. Lucio Edward.      Reason for Consultation: Proctitis.   HPI: Shawn [name] is a 35 y.o. male.  PMH dilated cardiomyopathy.  Hypertension.  B12 deficiency, oral B12 initiated 12/2020  In March 2022 treated with doxycycline for right groin abscess, declined suggested I&D. 01/01/2021 colonoscopy for evaluation of rectal abnormality on both CT and MRI, rectal pain.  Exam to the terminal ileum which was normal.  Congested, indurated, friable mucosa from the distal to proximal rectum.  Biopsy pathology chronic colitis with focal activity.  features nonspecific but include low-grade ischemia, medication effect, infectious process and IBD.  MD suspected Crohn's disease but pathology was not diagnostic. 12/20/2020 CT pelvis with stable, diffuse rectal wall thickening and adjacent fat stranding, question proctitis  vs carcinoma.  ? of left anorectal fistula.  Perirectal lymphadenopathy.  Fat-containing right inguinal hernia.  Patient was treated with 6 weeks abx of Rocephin and Flagyl, later changed to Augmentin  QuantiFERON gold test negative, Hep B surface Ag negative 01/02/2021. At a follow-up GI office visit on 8/23 patient complaining of recurrent coccygeal pain.  Formed, soft stool without blood.  No weight loss.  PA raise suspicion for recurrent osteomyelitis given his coccygeal pain.  PA ordered 8/24 CT pelvis which showed ongoing distal rectal thickening air-containing abscess 1.3 x 0.7 cm above the levator muscle.  Inflammatory stranding in the fat extending into the posterior tissues and looks to involve tip of coccyx.  Suspicion of coccygeal  osteomyelitis.  Suspected fistulous tracts and inflammation involving prostate and base of penis better seen on MRI of May.  Stable, prominent, likely reactive peribronchial lymph nodes.  Dr. Fuller Plan reviewed the case and still suspects Crohn's disease.  Would like to initiate biologic, probably Remicade once issues with infection resolved.  Due to ongoing coccygeal pain patient return to ED. 04/19/2021 MR Pelvis: Progressive coccygeal osteomyelitis with 2.7 m abscess communicating with smaller 1.9 cm perirectal abscess via small sinus tract.  Pericoccygeal inflammation has worsened since April, perirectal inflammation has improved since April. WBCs 12.7.  Hb 11.6.  MCV 94.  Platelets normal.  Mild AKI versus CKD with GFR 56.  LFTs normal.  Lactic acid normal.  CRP 4.5 (normal is less than 1).  ESR 40.  INR 1  Denies drainage or bleeding from the perirectum.  Coccygeal pain is worse when he sitting up.  Denies fevers.  Daily brown stools has not seen blood in the stool or with wiping.  No abdominal pain.  Good appetite.  No nausea.  Works as a Aeronautical engineer at the [employer]  Occasional alcohol.  A couple of beers every few weeks and occasionally may have a shot of spirits.    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   PO B12 initiated 12/31/20   Dilated cardiomyopathy (  [place]) 05/18/2013   By catheterization 2014    Hypertension     Past Surgical History:  Procedure Laterality Date   BIOPSY  01/01/2021   Procedure: BIOPSY;  Surgeon: Ladene Artist, MD;  Location: [facility];  Service: Endoscopy;;   COLONOSCOPY WITH PROPOFOL N/A 01/01/2021   Procedure: COLONOSCOPY WITH PROPOFOL;  Surgeon: Ladene Artist, MD;  [facility] ENDOSCOPY;  Service: Endoscopy;  Laterality: N/A;   LEFT AND RIGHT HEART CATHETERIZATION WITH CORONARY ANGIOGRAM N/A 11/21/2012   Procedure: LEFT AND RIGHT HEART CATHETERIZATION WITH CORONARY  ANGIOGRAM;  Surgeon: Birdie Riddle, MD;  Location: [facility];  Service: Cardiovascular;  Laterality: N/A;   MANDIBLE FRACTURE SURGERY  2006    Prior to Admission medications   Medication Sig Start Date End Date Taking? Authorizing Provider  IBUPROFEN PO Take 3 tablets by mouth daily as needed (pain).   Yes [provider]  lisinopril (ZESTRIL) 10 MG tablet Take 1 tablet (10 mg total) by mouth daily. 11/18/20 04/19/21 Yes Charlott Rakes, MD  vitamin B-12 1000 MCG tablet Take 1 tablet (1,000 mcg total) by mouth daily. 01/03/21  Yes Sanjuan Dame, MD    Scheduled Meds:  enoxaparin (LOVENOX) injection  40 mg Subcutaneous Daily   Infusions:  piperacillin-tazobactam (ZOSYN)  IV 3.375 g (04/20/21 0513)   vancomycin 1,250 mg (04/20/21 0946)   PRN Meds: acetaminophen **OR** acetaminophen, HYDROmorphone (DILAUDID) injection, ondansetron **OR** ondansetron (ZOFRAN) IV, oxyCODONE, polyethylene glycol   Allergies as of 04/19/2021 - Review Complete 04/19/2021  Allergen Reaction Noted   Shrimp [shellfish allergy] Shortness Of Breath 05/16/2013    Family History  Problem Relation Age of Onset   Diabetes Mother    Hypertension Other     Social History   Socioeconomic History   Marital status: Single    Spouse name: Not on file   Number of children: Not on file   Years of education: Not on file   Highest education level: Not on file  Occupational History   Not on file  Tobacco Use   Smoking status: Every Day    Packs/day: 0.25    Types: Cigarettes   Smokeless tobacco: Never  Vaping Use   Vaping Use: Never used  Substance and Sexual Activity   Alcohol use: Yes    Comment: occ   Drug use: No   Sexual activity: Not on file  Other Topics Concern   Not on file  Social History Narrative   Not on file   Social Determinants of Health   Financial Resource Strain: Not on file  Food Insecurity: Not on file  Transportation Needs: Not on file  Physical Activity: Not on  file  Stress: Not on file  Social Connections: Not on file  Intimate Partner Violence: Not on file    REVIEW OF SYSTEMS: Constitutional: No profound fatigue or weakness. ENT:  No nose bleeds Pulm: No shortness of breath or cough. CV:  No palpitations, no LE edema.  No angina GU:  No hematuria, no frequency GI: See HPI. Heme: No unusual or excessive bleeding or bruising. Transfusions:  none.   Neuro:  No headaches, no peripheral tingling or numbness Derm:  No itching, no rash or sores.  Endocrine:  No sweats or chills.  No polyuria or dysuria Immunization: Reviewed.  Tdap in 09/2018. Travel:  None beyond local counties in last few months.    PHYSICAL EXAM: Vital signs in last 24 hours: Vitals:   04/20/21 0333 04/20/21 0752  BP: 128/78 103/68  Pulse: (!) 59 (!) 59  Resp: 17 18  Temp: 98.2 F (36.8 C) 98 F (36.7 C)  SpO2: 100% 99%   Wt Readings from Last 3 Encounters:  04/19/21 93 kg  04/14/21 93.2 kg  03/19/21 91.6 kg    General: Pleasant, comfortable, does not look ill.  Laying on his right side to avoid coccygeal discomfort. Head: No facial asymmetry or swelling.  No signs of head trauma. Eyes: Conjunctiva pink.  No scleral icterus.  EOMI Ears: Not hard of hearing Nose: No congestion or discharge. Mouth: Good dentition.  Tongue midline.  Oropharynx moist, pink, clear. Neck: No JVD, no masses, no thyromegaly Lungs: No labored breathing or cough.  Lungs clear to auscultation bilaterally. Heart: RRR.  No MRG.  S1, S2 present Abdomen: Soft.  Not tender.  Not distended.  No HSM, masses, bruits, hernias.  Active bowel sounds..   Rectal: Visual inspection of rectum is unremarkable.  There is a small well-healed subcentimeter healed linear spot where there had been drainage some months ago.  No palpable abscesses.  Did not perform digital inspection of rectum. Musc/Skeltl: No joint redness, swelling or gross deformities. Extremities: No CCE. Neurologic: No tremors.  No  limb weakness.  Fully alert and oriented. Skin: No rash, no suspicious lesions. Nodes: No cervical adenopathy Psych: Calm, cooperative, pleasant, fluid speech.  Intake/Output from previous day: 08/28 0701 - 08/29 0700 In: 320 [P.O.:320] Out: -  Intake/Output this shift: Total I/O In: 240 [P.O.:240] Out: -   LAB RESULTS: Recent Labs    04/19/21 0408 04/19/21 1622 04/20/21 0114  WBC 11.5* 12.7* 11.8*  HGB 12.5* 11.8* 11.6*  HCT 36.7* 35.0* 33.6*  PLT 357 348 324   BMET Lab Results  Component Value Date   NA 137 04/20/2021   NA 137 04/19/2021   NA 138 04/14/2021   K 4.1 04/20/2021   K 4.1 04/19/2021   K 4.1 04/14/2021   CL 111 04/20/2021   CL 108 04/19/2021   CL 108 04/14/2021   CO2 21 (L) 04/20/2021   CO2 16 (L) 04/19/2021   CO2 21 04/14/2021   GLUCOSE 91 04/20/2021   GLUCOSE 107 (H) 04/19/2021   GLUCOSE 84 04/14/2021   BUN 17 04/20/2021   BUN 17 04/19/2021   BUN 21 04/14/2021   CREATININE 1.62 (H) 04/20/2021   CREATININE 1.62 (H) 04/19/2021   CREATININE 1.58 (H) 04/19/2021   CALCIUM 8.5 (L) 04/20/2021   CALCIUM 9.1 04/19/2021   CALCIUM 9.2 04/14/2021   LFT Recent Labs    04/19/21 0408 04/20/21 0114  PROT 8.0 7.5  ALBUMIN 3.5 3.1*  AST 22 17  ALT 31 25  ALKPHOS 93 90  BILITOT 0.7 0.8   PT/INR Lab Results  Component Value Date   INR 1.0 04/19/2021   INR 1.17 10/07/2018   INR 1.11 11/20/2012   Hepatitis Panel No results for input(s): HEPBSAG, HCVAB, HEPAIGM, HEPBIGM in the last 72 hours. C-Diff No components found for: CDIFF Lipase     Component Value Date/Time   LIPASE 35 06/04/2020 1148    Drugs of Abuse     Component Value Date/Time   LABOPIA NONE DETECTED 01/10/2014 2044   COCAINSCRNUR NONE DETECTED 01/10/2014 2044   LABBENZ NONE DETECTED 01/10/2014 2044   AMPHETMU NONE DETECTED 01/10/2014 2044   THCU NONE DETECTED 01/10/2014 2044   LABBARB NONE DETECTED 01/10/2014 2044     RADIOLOGY STUDIES: MR PELVIS W WO  CONTRAST  Result Date: 04/19/2021 CLINICAL DATA:  Sacral pain.  History of perirectal abscess and sacral osteomyelitis. EXAM: MRI PELVIS WITHOUT AND WITH CONTRAST TECHNIQUE: Multiplanar multisequence MR imaging of the pelvis was performed both before and after administration of intravenous contrast. CONTRAST:  9.25m GADAVIST GADOBUTROL 1 MMOL/ML IV SOLN COMPARISON:  CT pelvis dated April 15, 2021. MR abdomen and pelvis dated Jan 02, 2021. FINDINGS: Bones/Joint/Cartilage Abnormal marrow edema and enhancement of the lower coccyx with prominent surrounding inflammatory changes. No fracture or dislocation. Joint spaces are preserved. No joint effusion. Muscles and Tendons Intact.  No muscle edema or atrophy. Soft tissue Rectal wall thickening and perirectal inflammatory changes with unchanged 1.9 x 0.7 cm gas and air-fluid collection along the left posterolateral aspect of the rectum (4 o'clock position) above the levator musculature (series 13, image 35). Again seen is a small sinus tract from this fluid collection extending to a larger fluid collection surrounding the coccyx measuring approximately 1.5 x 2.0 x 2.7 cm (series 13, image 39). Perirectal inflammatory changes have overall decreased compared to the MRI in April, but pericoccygeal inflammatory changes have increased. Prominent subcentimeter perirectal lymph nodes are similar to prior MRI. IMPRESSION: 1. Progressive osteomyelitis involving the lower coccyx with 2.7 cm abscess surrounding the coccyx that communicates with a small 1.9 cm perirectal abscess through a small sinus tract. 2. Pericoccygeal inflammatory changes have worsened since April. Perirectal inflammatory changes have improved since April. Electronically Signed   By: WTitus DubinM.D.   On: 04/19/2021 11:48   DG Chest Port 1 View  Result Date: 04/19/2021 CLINICAL DATA:  35year old male with pain. EXAM: PORTABLE CHEST 1 VIEW COMPARISON:  Portable chest 10/25/2019 and earlier. FINDINGS:  Portable AP semi upright views at 0436 hours. Lung volumes and mediastinal contours are within normal limits. Visualized tracheal air column is within normal limits. Allowing for portable technique the lungs are clear. No pneumothorax. No pleural effusion is evident. No osseous abnormality identified. IMPRESSION: Negative portable chest. Electronically Signed   By: HGenevie AnnM.D.   On: 04/19/2021 04:57      IMPRESSION:      Proctitis.  Suspect Crohn's disease though pathology from May not entirely confirmatory of Crohn's/IBD though Crohn's was in the differential. Fistulization from rectum to coccyx.     Recurrent coccygeal osteomyelitis.  Had prolonged course of antibiotics for same starting May 2022.  On Vanco and Zosyn.  Blood cultures thus far negative.     Mild, normocytic anemia.  In May 2022 was B12 deficient and had low iron, low TIBC, low sats, ferritin 105.  Hgb nadir May 8.5 >> 11.6 now.  Compliant with oral B12.  QuantiFERON gold test negative, Hep B surface Ag negative 01/02/2021.   PLAN:     Although it looks like he has proctitis on CT scan, GI symptoms are nil.  Reluctant to start steroids in the setting of active osteomyelitis.  Plans for biologic (Remicade) on hold until infection resolves which will be several weeks.   SAzucena Freed 04/20/2021, 11:46 AM Phone [phone]  ________________________________________________________________________  LVelora HecklerGI MD note:  I personally examined the patient, reviewed the data and agree with the assessment and plan described above.  I'm not sure what else could be the prime issue here other than IBD, Crohn's.  I've asked [facility] to comment and started conversation with the patient that the best course of action may be fecal diversion (ostomy) allow the infection to fully heal prior to initiation of biologics with a tentative plan of reversing the ostomy when  possible.  May need ID input as well.  Owens Loffler, MD [facility]  Gastroenterology Pager [phone]

## 2021-04-20 NOTE — Progress Notes (Signed)
Pharmacy Antibiotic Note  Shawn [name] is a 34 y.o. male admitted on 04/19/2021 with  possible recurrent osteomyelitis of the coccyx .  Pharmacy has been consulted for Vancomycin/Zosyn dosing.   Cr slightly above baseline to changing Zosyn to ceftazidime to minimize AKI risk.  Plan: Stop Zosyn Ceftazidime 2g IV q8h   Height: 7' 1"  (215.9 cm) Weight: 93 kg (205 lb) IBW/kg (Calculated) : 107.5  Temp (24hrs), Avg:98.1 F (36.7 C), Min:97.4 F (36.3 C), Max:98.4 F (36.9 C)  Recent Labs  Lab 04/14/21 1544 04/19/21 0408 04/19/21 1622 04/20/21 0114  WBC 9.1 11.5* 12.7* 11.8*  CREATININE 1.44 1.58* 1.62* 1.62*  LATICACIDVEN  --  1.1 0.8  --      Estimated Creatinine Clearance: 82.9 mL/min (A) (by C-G formula based on SCr of 1.62 mg/dL (H)).    Allergies  Allergen Reactions   Shrimp [Shellfish Allergy] Shortness Of Breath    Arrie Senate, PharmD, [place] Clinical Pharmacist [phone] Please check AMION for all [place] numbers 04/20/2021

## 2021-04-20 NOTE — Progress Notes (Signed)
[facility] Progress Note  Patient: Shawn [name]    UDJ:[id number]  DOA: 04/19/2021     Date of Service: the patient was seen and examined on 04/20/2021  Brief hospital course: Past medical history of perirectal abscess, HTN, smoker, osteomyelitis, hidradenitis.  Presents with complaints of sacral pain.  Found to have worsening osteomyelitis as well as recurrent rectal abscess. ID, GI, general surgery consulted. Currently plan is continue IV antibiotics, follow-up on postop recovery.  Subjective: Pain still present but controlled with medication.  No nausea no vomiting.  Reports constipation.  Assessment and Plan: 1.  Proctitis Coccygeal osteomyelitis Consult for Crohn's disease Fistulization from rectum to coccyx GI consulted. Biopsies not confirmatory. Currently on IV antibiotics.  Changing IV vancomycin and Zosyn to IV Fortaz and Flagyl and IV vancomycin. No indication for initiation of IV steroids or immunomodulator therapy for suspected IBD. ID consulted recommended continue IV antibiotics. General surgery consulted recommend EUA, I&D and seton placement. Monitor recommendation.  Continue pain control.  2.  Essential hypertension Blood pressure stable.  Monitor.  3.  Chronic kidney disease stage III Renal function stable. Avoiding cefepime but Continue IV fluids.  4.  Metabolic acidosis Non-anion gap.  Improving with IV hydration.  Monitor.  5.  Leukocytosis Improving.  Scheduled Meds:  enoxaparin (LOVENOX) injection  40 mg Subcutaneous Daily   Continuous Infusions:  cefTAZidime (FORTAZ)  IV     metronidazole     vancomycin 1,250 mg (04/20/21 0946)   PRN Meds: acetaminophen **OR** acetaminophen, HYDROmorphone (DILAUDID) injection, ondansetron **OR** ondansetron (ZOFRAN) IV, oxyCODONE, polyethylene glycol  Body mass index is 19.95 kg/m.        DVT Prophylaxis:   enoxaparin (LOVENOX) injection 40 mg Start: 04/19/21 1000 SCDs Start: 04/19/21 5885     Advance goals of care discussion: Pt is Full code.  Family Communication: no family was present at bedside, at the time of interview.   Data Reviewed: I have personally reviewed and interpreted daily labs, tele strips, imaging. Hemoglobin 11.6, WBC 11.8.  Platelets stable.  Serum creatinine stable 1.62.  CO2 21.  Physical Exam:  General: Appear in mild distress, no Rash; Oral Mucosa Clear, moist. no Abnormal Neck Mass Or lumps, Conjunctiva normal  Cardiovascular: S1 and S2 Present, no Murmur, Respiratory: good respiratory effort, Bilateral Air entry present and CTA, no Crackles, no wheezes Abdomen: Bowel Sound present, Soft and no tenderness Extremities: no Pedal edema Neurology: alert and oriented to time, place, and person affect appropriate. no new focal deficit Gait not checked due to patient safety concerns   Vitals:   04/20/21 0752 04/20/21 1200 04/20/21 1707 04/20/21 1942  BP: 103/68 126/83 129/76 118/67  Pulse: (!) 59 (!) 55 (!) 57 80  Resp: 18 18 17 18   Temp: 98 F (36.7 C) (!) 97.4 F (36.3 C) 98.2 F (36.8 C) 98 F (36.7 C)  TempSrc: Oral Oral Oral Oral  SpO2: 99% 100% 100% 98%  Weight:      Height:        Disposition:  Status is: Inpatient  Remains inpatient appropriate because:Ongoing diagnostic testing needed not appropriate for outpatient work up and IV treatments appropriate due to intensity of illness or inability to take PO  Dispo: The patient is from: Home              Anticipated d/c is to: Home              Patient currently is not medically stable to d/c.   Difficult to place  patient No        Time spent: 35 minutes. I reviewed all nursing notes, pharmacy notes, vitals, pertinent old records. I have discussed plan of care as described above with RN.  Author: Berle Mull, MD [facility] 04/20/2021 7:56 PM  To reach On-call, see care teams to locate the attending and reach out via [email or web address]. Between 7PM-7AM, please contact  night-coverage If you still have difficulty reaching the attending provider, please page the [facility] (Director on Call) for [facility] on amion for assistance.

## 2021-04-20 NOTE — Consult Note (Addendum)
Shawn [name] 24-Jun-1986  [id number].    Requesting MD: Dr. Berle Mull Chief Complaint/Reason for Consult: suspected crohn's disease with persistent pelvic infection  HPI:  This is a 35 yo black male who we were asked to see for suspected crohn's disease with persistent pelvic infection.   Patient began having some UTI type symptoms in October of 2021 after recently being treated for a UTI.  He saw the ED and had a CT scan show a prominence of the rectum which could be related to rectal carcinoma vs proctitis.  He was given 10 days of Bactrim and a referral was made to GI as well as urology.  He was unable to follow up due to financial issues. He then returned in March 2022 with "tailbone" pain.  He was noted to have a right groin abscess, right medial thigh, and left perianal region.  He declined drainage by the ED at that time. He was discharged home on doxy for 7 days.  He represented at the end of April for persisted coccygeal pain. He underwent an MRI which revealed pelvic lymphadenopathy and a markedly abnormal long segment wall thickening in the rectum with extensive edema and inflammation.  He was also found to have edema tracking to the presacral space which abnormal signal of the coccyx and osteo could not be ruled out.  He was once again discharged home with levaquin/flagyl with again close follow up recommendations to see GI.    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  He underwent a colonoscopy by GI which was unremarkable except the rectal area which biopsy revealed chronic colitis, but otherwise non-diagnotic.  It has been suspected that he may have crohn's disease, but never proven.  He underwent 6 weeks of IV abx therapy by ID including Rocephin/Flagyl.  He has since followed up with  GI with the expectation of once his infection was resolved that he could start on Remicade.    He began having pain to the coccyx again ~16 days ago and returned to the ED for evaluation.  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  He has been admitted and started on abx therapy.  We have been asked to see for further evaluation and recommendations.  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 He denies any prior abdominal surgeries. He denies family hx of known IBD (Crohn's or UC). Other medical hx includes dilated cardiomyopathy in 2014 with a recent relatively normal echo and EF of 65-70% and HTN currently treated with Lisinopril.   ROS: Review of Systems  Constitutional:  Negative for chills and fever.  Respiratory:  Negative for cough and shortness of breath.   Cardiovascular:  Negative for chest pain.  Gastrointestinal:  Negative for abdominal pain, blood in stool, constipation, diarrhea, melena, nausea and vomiting.       Coccygeal pain  Psychiatric/Behavioral:  Negative for substance abuse.   All other systems reviewed and are negative.: Please see HPI, otherwise all other systems have been reviewed and are negative.  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   PO B12 initiated 12/31/20   Dilated cardiomyopathy ([place]) 05/18/2013   By catheterization 2014    Hypertension     Past Surgical History:  Procedure Laterality Date   BIOPSY  01/01/2021   Procedure: BIOPSY;  Surgeon: Ladene Artist, MD;  Location: [facility] ENDOSCOPY;  Service: Endoscopy;;   COLONOSCOPY WITH PROPOFOL N/A 01/01/2021    Procedure: COLONOSCOPY WITH PROPOFOL;  Surgeon: Ladene Artist, MD;  Location: [facility] ENDOSCOPY;  Service: Endoscopy;  Laterality: N/A;   LEFT AND RIGHT HEART CATHETERIZATION WITH CORONARY ANGIOGRAM N/A 11/21/2012   Procedure: LEFT AND RIGHT HEART CATHETERIZATION WITH CORONARY ANGIOGRAM;  Surgeon: Birdie Riddle, MD;  Location: [facility] CATH LAB;  Service: Cardiovascular;  Laterality: N/A;   MANDIBLE FRACTURE SURGERY  2006    Social History:  reports that he has been smoking cigarettes. He has been smoking an average of .25 packs per day. He has never used smokeless tobacco. He reports current alcohol use. He reports that he does not use drugs. Smokes 7-8 cigarettes a day Occasional alcohol use No illicit drug use Works Engineer, petroleum at [employer]  Allergies:  Allergies  Allergen Reactions   Shrimp [Shellfish Allergy] Shortness Of Breath    Medications Prior to Admission  Medication Sig Dispense Refill   IBUPROFEN PO Take 3 tablets by mouth daily as needed (pain).     lisinopril (ZESTRIL) 10 MG tablet Take 1 tablet (10 mg total) by mouth daily. 30 tablet 6   vitamin B-12 1000 MCG tablet Take 1 tablet (1,000 mcg total) by mouth daily. 30 tablet 0     Physical Exam: Blood pressure 126/83, pulse (!) 55, temperature (!) 97.4 F (36.3 C), temperature source Oral, resp. rate 18, height 7' 1"  (2.159 m), weight 93 kg, SpO2 100 %. General: pleasant, WD, WN male who is laying in bed in NAD HEENT: head is normocephalic, atraumatic.  Sclera are noninjected.  PERRL.  Ears and nose without any masses or lesions.  Mouth is pink and moist Heart: regular, rate, and rhythm.  Normal s1,s2. No obvious murmurs, gallops, or rubs noted.  Palpable radial and pedal pulses bilaterally Lungs: CTAB, no wheezes, rhonchi, or rales noted.  Respiratory effort nonlabored Abd: soft, NT, ND, +BS, no masses, hernias, or organomegaly Rectal: Chaperone present. Patient with point tenderness with palpation over the coccyx.  Otherwise NT. There is an old, well healed scar from prior I&D of peri-rectal abscess. There is no external erythema, heat, induration, fluctuance or drainage. Anus appears normal.  MS: all 4 extremities are symmetrical with no cyanosis, clubbing, or edema. Skin: warm and dry with no masses, lesions, or rashes Neuro: Cranial nerves 2-12 grossly intact, sensation is normal throughout, thought process intact, moves all extremities, gait not assessed Psych: A&Ox3 with an appropriate affect.  Results for orders placed or performed during the hospital encounter of 04/19/21 (from the past 48 hour(s))  Comprehensive metabolic panel     Status: Abnormal   Collection Time: 04/19/21  4:08 AM  Result Value Ref Range   Sodium 137 135 - 145 mmol/L   Potassium 4.1 3.5 - 5.1 mmol/L   Chloride 108 98 - 111 mmol/L   CO2 16 (L) 22 - 32 mmol/L   Glucose, Bld 107 (H) 70 - 99 mg/dL    Comment: Glucose reference range  applies only to samples taken after fasting for at least 8 hours.   BUN 17 6 - 20 mg/dL   Creatinine, Ser 1.58 (H) 0.61 - 1.24 mg/dL   Calcium 9.1 8.9 - 10.3 mg/dL   Total Protein 8.0 6.5 - 8.1 g/dL   Albumin 3.5 3.5 - 5.0 g/dL   AST 22 15 - 41 U/L   ALT 31 0 - 44 U/L   Alkaline Phosphatase 93 38 - 126 U/L   Total Bilirubin 0.7 0.3 - 1.2 mg/dL   GFR, Estimated 58 (L) >60 mL/min    Comment: (NOTE) Calculated using the CKD-EPI Creatinine Equation (2021)    Anion gap 13 5 - 15    Comment: Performed at [facility] [address], [place], [place] [address]  Lactic acid, plasma     Status: None   Collection Time: 04/19/21  4:08 AM  Result Value Ref Range   Lactic Acid, Venous 1.1 0.5 - 1.9 mmol/L    Comment: Performed at [facility] [address] [place], [place] [address]  CBC with Differential     Status: Abnormal   Collection Time: 04/19/21  4:08 AM  Result Value Ref Range   WBC 11.5 (H) 4.0 - 10.5 K/uL   RBC 3.90 (L) 4.22 - 5.81 MIL/uL   Hemoglobin 12.5 (L) 13.0 -  17.0 g/dL   HCT 36.7 (L) 39.0 - 52.0 %   MCV 94.1 80.0 - 100.0 fL   MCH 32.1 26.0 - 34.0 pg   MCHC 34.1 30.0 - 36.0 g/dL   RDW 17.4 (H) 11.5 - 15.5 %   Platelets 357 150 - 400 K/uL   nRBC 0.0 0.0 - 0.2 %   Neutrophils Relative % 59 %   Neutro Abs 6.9 1.7 - 7.7 K/uL   Lymphocytes Relative 29 %   Lymphs Abs 3.4 0.7 - 4.0 K/uL   Monocytes Relative 6 %   Monocytes Absolute 0.7 0.1 - 1.0 K/uL   Eosinophils Relative 4 %   Eosinophils Absolute 0.5 0.0 - 0.5 K/uL   Basophils Relative 1 %   Basophils Absolute 0.1 0.0 - 0.1 K/uL   Immature Granulocytes 1 %   Abs Immature Granulocytes 0.06 0.00 - 0.07 K/uL    Comment: Performed at [facility] [address] [place], [place] [address]  Protime-INR     Status: None   Collection Time: 04/19/21  4:08 AM  Result Value Ref Range   Prothrombin Time 13.6 11.4 - 15.2 seconds   INR 1.0 0.8 - 1.2    Comment: (NOTE) INR goal varies based on device and disease states. Performed at [facility], [place] [address] [place], [place] [address]   Culture, blood (Routine x 2)     Status: None (Preliminary result)   Collection Time: 04/19/21  4:16 AM   Specimen: BLOOD  Result Value Ref Range   Specimen Description BLOOD LEFT ANTECUBITAL    Special Requests      BOTTLES DRAWN AEROBIC AND ANAEROBIC Blood Culture adequate volume   Culture      NO GROWTH 1 DAY Performed at [facility], [place] [address] [place], [place] [address]    Report Status PENDING   Culture, blood (Routine x 2)     Status: None (Preliminary result)   Collection Time: 04/19/21  5:10 AM   Specimen: BLOOD RIGHT ARM  Result Value Ref Range   Specimen Description BLOOD RIGHT ARM  Special Requests      BOTTLES DRAWN AEROBIC AND ANAEROBIC Blood Culture results may not be optimal due to an inadequate volume of blood received in culture bottles   Culture      NO GROWTH 1 DAY Performed at [facility] [address], [place], [place] [address]    Report  Status PENDING   Resp Panel by RT-PCR (Flu A&B, Covid) Nasopharyngeal Swab     Status: None   Collection Time: 04/19/21  5:12 AM   Specimen: Nasopharyngeal Swab; Nasopharyngeal(NP) swabs in vial transport medium  Result Value Ref Range   SARS Coronavirus 2 by RT PCR NEGATIVE NEGATIVE    Comment: (NOTE) SARS-CoV-2 target nucleic acids are NOT DETECTED.  The SARS-CoV-2 RNA is generally detectable in upper respiratory specimens during the acute phase of infection. The lowest concentration of SARS-CoV-2 viral copies this assay can detect is 138 copies/mL. A negative result does not preclude SARS-Cov-2 infection and should not be used as the sole basis for treatment or other patient management decisions. A negative result may occur with  improper specimen collection/handling, submission of specimen other than nasopharyngeal swab, presence of viral mutation(s) within the areas targeted by this assay, and inadequate number of viral copies(<138 copies/mL). A negative result must be combined with clinical observations, patient history, and epidemiological information. The expected result is Negative.  Fact Sheet for Patients:  EntrepreneurPulse.com.au  Fact Sheet for Healthcare Providers:  IncredibleEmployment.be  This test is no t yet approved or cleared by the [place] FDA and  has been authorized for detection and/or diagnosis of SARS-CoV-2 by FDA under an Emergency Use Authorization (EUA). This EUA will remain  in effect (meaning this test can be used) for the duration of the COVID-19 declaration under Section 564(b)(1) of the Act, 21 U.S.C.section 360bbb-3(b)(1), unless the authorization is terminated  or revoked sooner.       Influenza A by PCR NEGATIVE NEGATIVE   Influenza B by PCR NEGATIVE NEGATIVE    Comment: (NOTE) The Xpert Xpress SARS-CoV-2/FLU/RSV plus assay is intended as an aid in the diagnosis of influenza from Nasopharyngeal  swab specimens and should not be used as a sole basis for treatment. Nasal washings and aspirates are unacceptable for Xpert Xpress SARS-CoV-2/FLU/RSV testing.  Fact Sheet for Patients: EntrepreneurPulse.com.au  Fact Sheet for Healthcare Providers: IncredibleEmployment.be  This test is not yet approved or cleared by the [place] FDA and has been authorized for detection and/or diagnosis of SARS-CoV-2 by FDA under an Emergency Use Authorization (EUA). This EUA will remain in effect (meaning this test can be used) for the duration of the COVID-19 declaration under Section 564(b)(1) of the Act, 21 U.S.C. section 360bbb-3(b)(1), unless the authorization is terminated or revoked.  Performed at [facility], [place] [address] [place], [place] [address]   Urinalysis, Routine w reflex microscopic Urine, Clean Catch     Status: Abnormal   Collection Time: 04/19/21  6:03 AM  Result Value Ref Range   Color, Urine STRAW (A) YELLOW   APPearance CLEAR CLEAR   Specific Gravity, Urine 1.005 1.005 - 1.030   pH 6.0 5.0 - 8.0   Glucose, UA NEGATIVE NEGATIVE mg/dL   Hgb urine dipstick NEGATIVE NEGATIVE   Bilirubin Urine NEGATIVE NEGATIVE   Ketones, ur NEGATIVE NEGATIVE mg/dL   Protein, ur NEGATIVE NEGATIVE mg/dL   Nitrite NEGATIVE NEGATIVE   Leukocytes,Ua NEGATIVE NEGATIVE    Comment: Performed at [facility] [place],  [place] [address]  Lactic acid, plasma     Status: None   Collection Time: 04/19/21  4:22 PM  Result Value Ref Range   Lactic Acid, Venous 0.8 0.5 - 1.9 mmol/L    Comment: Performed at [facility], [place] [address], [place], [place] [address]  CBC     Status: Abnormal   Collection Time: 04/19/21  4:22 PM  Result Value Ref Range   WBC 12.7 (H) 4.0 - 10.5 K/uL   RBC 3.72 (L) 4.22 - 5.81 MIL/uL   Hemoglobin 11.8 (L) 13.0 - 17.0 g/dL   HCT 35.0 (L) 39.0 - 52.0 %   MCV 94.1 80.0 - 100.0 fL   MCH 31.7 26.0  - 34.0 pg   MCHC 33.7 30.0 - 36.0 g/dL   RDW 17.5 (H) 11.5 - 15.5 %   Platelets 348 150 - 400 K/uL   nRBC 0.0 0.0 - 0.2 %    Comment: Performed at [facility] [address] [place], [place] [address]  Creatinine, serum     Status: Abnormal   Collection Time: 04/19/21  4:22 PM  Result Value Ref Range   Creatinine, Ser 1.62 (H) 0.61 - 1.24 mg/dL   GFR, Estimated 56 (L) >60 mL/min    Comment: (NOTE) Calculated using the CKD-EPI Creatinine Equation (2021) Performed at [facility] [address] [place], [place] [address]   Sedimentation rate     Status: Abnormal   Collection Time: 04/19/21  4:22 PM  Result Value Ref Range   Sed Rate 40 (H) 0 - 16 mm/hr    Comment: Performed at [facility] [address] [place], [place] [address]  C-reactive protein     Status: Abnormal   Collection Time: 04/19/21  4:22 PM  Result Value Ref Range   CRP 4.5 (H) <1.0 mg/dL    Comment: Performed at [facility], [place] [address] [place], [place] [address]  Comprehensive metabolic panel     Status: Abnormal   Collection Time: 04/20/21  1:14 AM  Result Value Ref Range   Sodium 137 135 - 145 mmol/L   Potassium 4.1 3.5 - 5.1 mmol/L   Chloride 111 98 - 111 mmol/L   CO2 21 (L) 22 - 32 mmol/L   Glucose, Bld 91 70 - 99 mg/dL    Comment: Glucose reference range applies only to samples taken after fasting for at least 8 hours.   BUN 17 6 - 20 mg/dL   Creatinine, Ser 1.62 (H) 0.61 - 1.24 mg/dL   Calcium 8.5 (L) 8.9 - 10.3 mg/dL   Total Protein 7.5 6.5 - 8.1 g/dL   Albumin 3.1 (L) 3.5 - 5.0 g/dL   AST 17 15 - 41 U/L   ALT 25 0 - 44 U/L   Alkaline Phosphatase 90 38 - 126 U/L   Total Bilirubin 0.8 0.3 - 1.2 mg/dL   GFR, Estimated 56 (L) >60 mL/min    Comment: (NOTE) Calculated using the CKD-EPI Creatinine Equation (2021)    Anion gap 5 5 - 15    Comment: Performed at [facility], [place] [address], [place], [place] [address]  CBC     Status: Abnormal   Collection  Time: 04/20/21  1:14 AM  Result Value Ref Range   WBC 11.8 (H) 4.0 - 10.5 K/uL   RBC 3.56 (L) 4.22 - 5.81 MIL/uL   Hemoglobin 11.6 (L) 13.0 - 17.0 g/dL   HCT 33.6 (L)  39.0 - 52.0 %   MCV 94.4 80.0 - 100.0 fL   MCH 32.6 26.0 - 34.0 pg   MCHC 34.5 30.0 - 36.0 g/dL   RDW 17.5 (H) 11.5 - 15.5 %   Platelets 324 150 - 400 K/uL   nRBC 0.0 0.0 - 0.2 %    Comment: Performed at [facility] [address], [place], [place] [address]  Magnesium     Status: None   Collection Time: 04/20/21  1:14 AM  Result Value Ref Range   Magnesium 2.1 1.7 - 2.4 mg/dL    Comment: Performed at [facility] [address], [place], [place] [address]  Phosphorus     Status: None   Collection Time: 04/20/21  1:14 AM  Result Value Ref Range   Phosphorus 3.8 2.5 - 4.6 mg/dL    Comment: Performed at [facility] [address], [place], [place] [address]   MR PELVIS W WO CONTRAST  Result Date: 04/19/2021 CLINICAL DATA:  Sacral pain. History of perirectal abscess and sacral osteomyelitis. EXAM: MRI PELVIS WITHOUT AND WITH CONTRAST TECHNIQUE: Multiplanar multisequence MR imaging of the pelvis was performed both before and after administration of intravenous contrast. CONTRAST:  9.55m GADAVIST GADOBUTROL 1 MMOL/ML IV SOLN COMPARISON:  CT pelvis dated April 15, 2021. MR abdomen and pelvis dated Jan 02, 2021. FINDINGS: Bones/Joint/Cartilage Abnormal marrow edema and enhancement of the lower coccyx with prominent surrounding inflammatory changes. No fracture or dislocation. Joint spaces are preserved. No joint effusion. Muscles and Tendons Intact.  No muscle edema or atrophy. Soft tissue Rectal wall thickening and perirectal inflammatory changes with unchanged 1.9 x 0.7 cm gas and air-fluid collection along the left posterolateral aspect of the rectum (4 o'clock position) above the levator musculature (series 13, image 35). Again seen is a small sinus tract from this fluid collection extending to a larger  fluid collection surrounding the coccyx measuring approximately 1.5 x 2.0 x 2.7 cm (series 13, image 39). Perirectal inflammatory changes have overall decreased compared to the MRI in April, but pericoccygeal inflammatory changes have increased. Prominent subcentimeter perirectal lymph nodes are similar to prior MRI. IMPRESSION: 1. Progressive osteomyelitis involving the lower coccyx with 2.7 cm abscess surrounding the coccyx that communicates with a small 1.9 cm perirectal abscess through a small sinus tract. 2. Pericoccygeal inflammatory changes have worsened since April. Perirectal inflammatory changes have improved since April. Electronically Signed   By: WTitus DubinM.D.   On: 04/19/2021 11:48   DG Chest Port 1 View  Result Date: 04/19/2021 CLINICAL DATA:  35year old male with pain. EXAM: PORTABLE CHEST 1 VIEW COMPARISON:  Portable chest 10/25/2019 and earlier. FINDINGS: Portable AP semi upright views at 0436 hours. Lung volumes and mediastinal contours are within normal limits. Visualized tracheal air column is within normal limits. Allowing for portable technique the lungs are clear. No pneumothorax. No pleural effusion is evident. No osseous abnormality identified. IMPRESSION: Negative portable chest. Electronically Signed   By: HGenevie AnnM.D.   On: 04/19/2021 04:57    Anti-infectives (From admission, onward)    Start     Dose/Rate Route Frequency Ordered Stop   04/19/21 2200  vancomycin (VANCOREADY) IVPB 1250 mg/250 mL        1,250 mg 166.7 mL/hr over 90 Minutes Intravenous Every 12 hours 04/19/21 0520     04/19/21 0500  piperacillin-tazobactam (ZOSYN) IVPB 3.375 g        3.375 g 12.5 mL/hr over 240 Minutes Intravenous  Every 8 hours 04/19/21 0458     04/19/21 0500  vancomycin (VANCOREADY) IVPB 2000 mg/400 mL        2,000 mg 200 mL/hr over 120 Minutes Intravenous  Once 04/19/21 0458 04/19/21 0800        Assessment/Plan Osteomyelitis of coccyx with stable pelvic fluid collections  and rectal inflammatory changes - This is a 35 y.o. 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  He underwent a colonoscopy by GI which was unremarkable except the rectal area which biopsy revealed chronic colitis, but otherwise non-diagnotic.  It has been suspected that he may have crohn's disease, but never proven.  He underwent 6 weeks of IV abx therapy by ID including Rocephin/Flagyl.  He has since followed up with GI with the expectation of once his infection was resolved that he could start on Remicade. He was doing well at home until 16 days ago when he develop coccygeal pain. 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 Case discussed with one of our colorectal surgeons, Dr. Dema Severin. He recommends EUA for possible I&D and seton placement. 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 Will discuss plan with my attending. Continue IV abx.   FEN - heart healthy diet currently  VTE - SCDs, Lovenox ID - Zosyn/Vanc  Alferd Apa, [facility] Surgery 04/20/2021, 1:52 PM Please see Amion for pager number during day hours 7:00am-4:30pm or 7:00am -11:30am on weekends  I personally saw the patient and performed a substantive portion of this encounter, including a complete performance of at least one of the key components (MDM, Hx and/or Exam), in conjunction with the Advanced Practice Provider Alferd Apa, PA-C.  We will discuss the timing of surgery with the patient and colorectal surgery. No emergent indications.  Imogene Burn. Georgette Dover, MD, [facility] Surgery  General  Surgery   04/20/2021 3:31 PM

## 2021-04-21 ENCOUNTER — Inpatient Hospital Stay (HOSPITAL_COMMUNITY): Payer: 59 | Admitting: Anesthesiology

## 2021-04-21 ENCOUNTER — Encounter (HOSPITAL_COMMUNITY)
Admission: EM | Disposition: A | Discharge status: Home / Self Care | Payer: Self-pay | Source: Home / Self Care | Attending: Internal Medicine

## 2021-04-21 ENCOUNTER — Encounter (HOSPITAL_COMMUNITY): Payer: Self-pay | Admitting: Internal Medicine

## 2021-04-21 DIAGNOSIS — R933 Abnormal findings on diagnostic imaging of other parts of digestive tract: Secondary | ICD-10-CM | POA: Diagnosis not present

## 2021-04-21 DIAGNOSIS — M869 Osteomyelitis, unspecified: Secondary | ICD-10-CM | POA: Diagnosis not present

## 2021-04-21 HISTORY — PX: INCISION AND DRAINAGE PERIRECTAL ABSCESS: SHX1804

## 2021-04-21 LAB — BASIC METABOLIC PANEL
Anion gap: 7 (ref 5–15)
BUN: 16 mg/dL (ref 6–20)
CO2: 22 mmol/L (ref 22–32)
Calcium: 8.7 mg/dL — ABNORMAL LOW (ref 8.9–10.3)
Chloride: 109 mmol/L (ref 98–111)
Creatinine, Ser: 1.55 mg/dL — ABNORMAL HIGH (ref 0.61–1.24)
GFR, Estimated: 59 mL/min — ABNORMAL LOW (ref >60)
Glucose, Bld: 111 mg/dL — ABNORMAL HIGH (ref 70–99)
Potassium: 4.5 mmol/L (ref 3.5–5.1)
Sodium: 138 mmol/L (ref 135–145)

## 2021-04-21 LAB — SURGICAL PCR SCREEN
MRSA, PCR: NEGATIVE
Staphylococcus aureus: NEGATIVE

## 2021-04-21 SURGERY — EXAM UNDER ANESTHESIA
Anesthesia: General

## 2021-04-21 MED ORDER — PROPOFOL 10 MG/ML IV BOLUS
INTRAVENOUS | Status: DC | PRN
  Administered 2021-04-21: 20 mg via INTRAVENOUS
  Administered 2021-04-21: 150 mg via INTRAVENOUS
  Administered 2021-04-21: 30 mg via INTRAVENOUS

## 2021-04-21 MED ORDER — ONDANSETRON HCL 4 MG/2ML IJ SOLN: 4.0000 mg | Freq: Once | INTRAMUSCULAR | Status: DC | PRN

## 2021-04-21 MED ORDER — LACTATED RINGERS IV SOLN: INTRAVENOUS | Status: DC

## 2021-04-21 MED ORDER — MEPERIDINE HCL 25 MG/ML IJ SOLN: 6.2500 mg | INTRAMUSCULAR | Status: DC | PRN

## 2021-04-21 MED ORDER — MIDAZOLAM HCL 2 MG/2ML IJ SOLN
INTRAMUSCULAR | Status: AC
  Filled 2021-04-21: qty 2

## 2021-04-21 MED ORDER — DEXAMETHASONE SODIUM PHOSPHATE 10 MG/ML IJ SOLN
INTRAMUSCULAR | Status: AC
  Filled 2021-04-21: qty 1

## 2021-04-21 MED ORDER — ORAL CARE MOUTH RINSE: 15.0000 mL | Freq: Once | OROMUCOSAL | Status: AC

## 2021-04-21 MED ORDER — 0.9 % SODIUM CHLORIDE (POUR BTL) OPTIME
TOPICAL | Status: DC | PRN
  Administered 2021-04-21: 1000 mL

## 2021-04-21 MED ORDER — HYDROMORPHONE HCL 1 MG/ML IJ SOLN
INTRAMUSCULAR | Status: AC
  Filled 2021-04-21: qty 0.5

## 2021-04-21 MED ORDER — PHENYLEPHRINE 40 MCG/ML (10ML) SYRINGE FOR IV PUSH (FOR BLOOD PRESSURE SUPPORT)
PREFILLED_SYRINGE | INTRAVENOUS | Status: DC | PRN
  Administered 2021-04-21 (×4): 80 ug via INTRAVENOUS

## 2021-04-21 MED ORDER — ESMOLOL HCL 100 MG/10ML IV SOLN
INTRAVENOUS | Status: AC
  Filled 2021-04-21: qty 10

## 2021-04-21 MED ORDER — ACETAMINOPHEN 10 MG/ML IV SOLN: 1000.0000 mg | Freq: Once | INTRAVENOUS | Status: DC | PRN

## 2021-04-21 MED ORDER — DEXMEDETOMIDINE (PRECEDEX) IN NS 20 MCG/5ML (4 MCG/ML) IV SYRINGE
PREFILLED_SYRINGE | INTRAVENOUS | Status: DC | PRN
  Administered 2021-04-21 (×2): 8 ug via INTRAVENOUS
  Administered 2021-04-21: 4 ug via INTRAVENOUS

## 2021-04-21 MED ORDER — MIDAZOLAM HCL 5 MG/5ML IJ SOLN
INTRAMUSCULAR | Status: DC | PRN
  Administered 2021-04-21: 2 mg via INTRAVENOUS

## 2021-04-21 MED ORDER — LIDOCAINE 2% (20 MG/ML) 5 ML SYRINGE
INTRAMUSCULAR | Status: AC
  Filled 2021-04-21: qty 5

## 2021-04-21 MED ORDER — HEMOSTATIC AGENTS (NO CHARGE) OPTIME
TOPICAL | Status: DC | PRN
  Administered 2021-04-21 (×2): 1 via TOPICAL

## 2021-04-21 MED ORDER — DEXMEDETOMIDINE (PRECEDEX) IN NS 20 MCG/5ML (4 MCG/ML) IV SYRINGE
PREFILLED_SYRINGE | INTRAVENOUS | Status: AC
  Filled 2021-04-21: qty 5

## 2021-04-21 MED ORDER — DEXAMETHASONE SODIUM PHOSPHATE 10 MG/ML IJ SOLN
INTRAMUSCULAR | Status: DC | PRN
  Administered 2021-04-21: 4 mg via INTRAVENOUS

## 2021-04-21 MED ORDER — CHLORHEXIDINE GLUCONATE 0.12 % MT SOLN
OROMUCOSAL | Status: AC
  Administered 2021-04-21: 15 mL via OROMUCOSAL
  Filled 2021-04-21: qty 15

## 2021-04-21 MED ORDER — PROPOFOL 10 MG/ML IV BOLUS
INTRAVENOUS | Status: AC
  Filled 2021-04-21: qty 40

## 2021-04-21 MED ORDER — FENTANYL CITRATE (PF) 250 MCG/5ML IJ SOLN
INTRAMUSCULAR | Status: AC
  Filled 2021-04-21: qty 5

## 2021-04-21 MED ORDER — LIDOCAINE 2% (20 MG/ML) 5 ML SYRINGE
INTRAMUSCULAR | Status: DC | PRN
  Administered 2021-04-21: 60 mg via INTRAVENOUS

## 2021-04-21 MED ORDER — ONDANSETRON HCL 4 MG/2ML IJ SOLN
INTRAMUSCULAR | Status: DC | PRN
  Administered 2021-04-21: 4 mg via INTRAVENOUS

## 2021-04-21 MED ORDER — CHLORHEXIDINE GLUCONATE 0.12 % MT SOLN: 15.0000 mL | Freq: Once | OROMUCOSAL | Status: AC

## 2021-04-21 MED ORDER — FENTANYL CITRATE (PF) 250 MCG/5ML IJ SOLN
INTRAMUSCULAR | Status: DC | PRN
  Administered 2021-04-21: 50 ug via INTRAVENOUS
  Administered 2021-04-21: 25 ug via INTRAVENOUS
  Administered 2021-04-21: 100 ug via INTRAVENOUS
  Administered 2021-04-21: 25 ug via INTRAVENOUS
  Administered 2021-04-21: 50 ug via INTRAVENOUS

## 2021-04-21 MED ORDER — HYDROMORPHONE HCL 1 MG/ML IJ SOLN: 0.2500 mg | INTRAMUSCULAR | Status: DC | PRN

## 2021-04-21 MED ORDER — ESMOLOL HCL 100 MG/10ML IV SOLN
INTRAVENOUS | Status: DC | PRN
  Administered 2021-04-21: 20 mg via INTRAVENOUS

## 2021-04-21 MED ORDER — HYDROMORPHONE HCL 1 MG/ML IJ SOLN
INTRAMUSCULAR | Status: DC | PRN
  Administered 2021-04-21: .5 mg via INTRAVENOUS

## 2021-04-21 MED ORDER — PHENYLEPHRINE 40 MCG/ML (10ML) SYRINGE FOR IV PUSH (FOR BLOOD PRESSURE SUPPORT)
PREFILLED_SYRINGE | INTRAVENOUS | Status: AC
  Filled 2021-04-21: qty 10

## 2021-04-21 MED ORDER — ONDANSETRON HCL 4 MG/2ML IJ SOLN
INTRAMUSCULAR | Status: AC
  Filled 2021-04-21: qty 2

## 2021-04-21 SURGICAL SUPPLY — 24 items
BAG COUNTER SPONGE SURGICOUNT (BAG) ×2 IMPLANT
BLADE EXTENDED COATED 6.5IN (ELECTRODE) ×2 IMPLANT
CANISTER SUCT 3000ML PPV (MISCELLANEOUS) IMPLANT
COVER SURGICAL LIGHT HANDLE (MISCELLANEOUS) ×2 IMPLANT
DRSG PAD ABDOMINAL 8X10 ST (GAUZE/BANDAGES/DRESSINGS) ×2 IMPLANT
GAUZE 4X4 16PLY ~~LOC~~+RFID DBL (SPONGE) ×2 IMPLANT
GLOVE SURG ENC MOIS LTX SZ7 (GLOVE) ×2 IMPLANT
GLOVE SURG UNDER POLY LF SZ7.5 (GLOVE) ×2 IMPLANT
GOWN STRL REUS W/ TWL LRG LVL3 (GOWN DISPOSABLE) ×2 IMPLANT
GOWN STRL REUS W/TWL LRG LVL3 (GOWN DISPOSABLE) ×2
HEMOSTAT SURGICEL .5X2 ABSORB (HEMOSTASIS) ×2 IMPLANT
KIT BASIN OR (CUSTOM PROCEDURE TRAY) ×2 IMPLANT
KIT SIGMOIDOSCOPE (SET/KITS/TRAYS/PACK) IMPLANT
KIT TURNOVER KIT B (KITS) ×2 IMPLANT
NS IRRIG 1000ML POUR BTL (IV SOLUTION) ×2 IMPLANT
PACK LITHOTOMY IV (CUSTOM PROCEDURE TRAY) ×2 IMPLANT
PAD ARMBOARD 7.5X6 YLW CONV (MISCELLANEOUS) ×4 IMPLANT
PENCIL BUTTON HOLSTER BLD 10FT (ELECTRODE) IMPLANT
SPONGE HEMORRHOID 8X3CM (HEMOSTASIS) ×2 IMPLANT
SURGILUBE 2OZ TUBE FLIPTOP (MISCELLANEOUS) ×2 IMPLANT
SYR BULB IRRIG 60ML STRL (SYRINGE) ×2 IMPLANT
TOWEL GREEN STERILE (TOWEL DISPOSABLE) ×2 IMPLANT
TUBE CONNECTING 12X1/4 (SUCTIONS) IMPLANT
YANKAUER SUCT BULB TIP NO VENT (SUCTIONS) IMPLANT

## 2021-04-21 NOTE — Progress Notes (Signed)
[facility]   Reason for visit: Follow up on osteomyelitis  Interval History: s/p surgical drainage, no gross purulence noted.  WBC 11.8 yesterday, remains afebrile.     Physical Exam: Constitutional:  Vitals:   04/21/21 1045 04/21/21 1055  BP: 104/86 113/77  Pulse: 61 63  Resp: 13 10  Temp:  (!) 97.2 F (36.2 C)  SpO2: 98% 98%   patient appears in NAD Respiratory: Normal respiratory effort  Review of Systems: Constitutional: negative for fevers and chills Integument/breast: negative for rash  Lab Results  Component Value Date   WBC 11.8 (H) 04/20/2021   HGB 11.6 (L) 04/20/2021   HCT 33.6 (L) 04/20/2021   MCV 94.4 04/20/2021   PLT 324 04/20/2021    Lab Results  Component Value Date   CREATININE 1.55 (H) 04/21/2021   BUN 16 04/21/2021   NA 138 04/21/2021   K 4.5 04/21/2021   CL 109 04/21/2021   CO2 22 04/21/2021    Lab Results  Component Value Date   ALT 25 04/20/2021   AST 17 04/20/2021   ALKPHOS 90 04/20/2021     Microbiology: Recent Results (from the past 240 hour(s))  Culture, blood (Routine x 2)     Status: None (Preliminary result)   Collection Time: 04/19/21  4:16 AM   Specimen: BLOOD  Result Value Ref Range Status   Specimen Description BLOOD LEFT ANTECUBITAL  Final   Special Requests   Final    BOTTLES DRAWN AEROBIC AND ANAEROBIC Blood Culture adequate volume   Culture   Final    NO GROWTH 2 DAYS Performed at [facility], [address], [place], [place] [address]    Report Status PENDING  Incomplete  Culture, blood (Routine x 2)     Status: None (Preliminary result)   Collection Time: 04/19/21  5:10 AM   Specimen: BLOOD RIGHT ARM  Result Value Ref Range Status   Specimen Description BLOOD RIGHT ARM  Final   Special Requests   Final    BOTTLES DRAWN AEROBIC AND ANAEROBIC Blood Culture results may not be optimal due to an inadequate volume of blood received in culture bottles   Culture   Final    NO  GROWTH 2 DAYS Performed at [facility], [place] [address], [place],  [address]    Report Status PENDING  Incomplete  Resp Panel by RT-PCR (Flu A&B, Covid) Nasopharyngeal Swab     Status: None   Collection Time: 04/19/21  5:12 AM   Specimen: Nasopharyngeal Swab; Nasopharyngeal(NP) swabs in vial transport medium  Result Value Ref Range Status   SARS Coronavirus 2 by RT PCR NEGATIVE NEGATIVE Final    Comment: (NOTE) SARS-CoV-2 target nucleic acids are NOT DETECTED.  The SARS-CoV-2 RNA is generally detectable in upper respiratory specimens during the acute phase of infection. The lowest concentration of SARS-CoV-2 viral copies this assay can detect is 138 copies/mL. A negative result does not preclude SARS-Cov-2 infection and should not be used as the sole basis for treatment or other patient management decisions. A negative result may occur with  improper specimen collection/handling, submission of specimen other than nasopharyngeal swab, presence of viral mutation(s) within the areas targeted by this assay, and inadequate number of viral copies(<138 copies/mL). A negative result must be combined with clinical observations, patient history, and epidemiological information. The expected result is Negative.  Fact Sheet for Patients:  EntrepreneurPulse.com.au  Fact Sheet for Healthcare Providers:  IncredibleEmployment.be  This test is no t yet approved or cleared by the [place] and  has been authorized for detection and/or diagnosis of SARS-CoV-2 by FDA under an Emergency Use Authorization (EUA). This EUA will remain  in effect (meaning this test can be used) for the duration of the COVID-19 declaration under Section 564(b)(1) of the Act, 21 U.S.C.section 360bbb-3(b)(1), unless the authorization is terminated  or revoked sooner.       Influenza A by PCR NEGATIVE NEGATIVE Final   Influenza B by PCR NEGATIVE NEGATIVE Final     Comment: (NOTE) The Xpert Xpress SARS-CoV-2/FLU/RSV plus assay is intended as an aid in the diagnosis of influenza from Nasopharyngeal swab specimens and should not be used as a sole basis for treatment. Nasal washings and aspirates are unacceptable for Xpert Xpress SARS-CoV-2/FLU/RSV testing.  Fact Sheet for Patients: EntrepreneurPulse.com.au  Fact Sheet for Healthcare Providers: IncredibleEmployment.be  This test is not yet approved or cleared by the [place] FDA and has been authorized for detection and/or diagnosis of SARS-CoV-2 by FDA under an Emergency Use Authorization (EUA). This EUA will remain in effect (meaning this test can be used) for the duration of the COVID-19 declaration under Section 564(b)(1) of the Act, 21 U.S.C. section 360bbb-3(b)(1), unless the authorization is terminated or revoked.  Performed at [facility], [place] [address] [place], [place] [address]   Surgical pcr screen     Status: None   Collection Time: 04/21/21  8:45 AM   Specimen: Nasal Mucosa; Nasal Swab  Result Value Ref Range Status   MRSA, PCR NEGATIVE NEGATIVE Final   Staphylococcus aureus NEGATIVE NEGATIVE Final    Comment: (NOTE) The Xpert SA Assay (FDA approved for NASAL specimens in patients 35 years of age and older), is one component of a comprehensive surveillance program. It is not intended to diagnose infection nor to guide or monitor treatment. Performed at [facility], [place] [address] [place], [place] [address]     Impression/Plan:  1. Osteomyelitis - progressive on MRI with a fluid collection but no pus noted during surgical drainage.  Will monitor cultures to see if anything grows.  MRI findings may just represent previous progression during treatment and now stable.  ESR and CRP not particularly concerning.  Continue with antibiotics.   2.  Leukocytosis - stable yesterday at 11.8

## 2021-04-21 NOTE — Progress Notes (Signed)
Day of Surgery   Subjective/Chief Complaint: Still with some coccygeal pain/ pressure in rectum   Objective: Vital signs in last 24 hours: Temp:  [97.4 F (36.3 C)-98.4 F (36.9 C)] 98.4 F (36.9 C) (08/30 0402) Pulse Rate:  [55-80] 57 (08/30 0402) Resp:  [16-18] 16 (08/30 0402) BP: (118-138)/(67-87) 138/87 (08/30 0402) SpO2:  [98 %-100 %] 100 % (08/30 0402) Last BM Date: 04/18/21  Intake/Output from previous day: 08/29 0701 - 08/30 0700 In: 1508.5 [P.O.:960; IV Piggyback:548.5] Out: -  Intake/Output this shift: No intake/output data recorded.  Deferred today  Lab Results:  Recent Labs    04/19/21 1622 04/20/21 0114  WBC 12.7* 11.8*  HGB 11.8* 11.6*  HCT 35.0* 33.6*  PLT 348 324   BMET Recent Labs    04/20/21 0114 04/21/21 0736  NA 137 138  K 4.1 4.5  CL 111 109  CO2 21* 22  GLUCOSE 91 111*  BUN 17 16  CREATININE 1.62* 1.55*  CALCIUM 8.5* 8.7*   PT/INR Recent Labs    04/19/21 0408  LABPROT 13.6  INR 1.0   ABG No results for input(s): PHART, HCO3 in the last 72 hours.  Invalid input(s): PCO2, PO2  Studies/Results: MR PELVIS W WO CONTRAST  Result Date: 04/19/2021 CLINICAL DATA:  Sacral pain. History of perirectal abscess and sacral osteomyelitis. EXAM: MRI PELVIS WITHOUT AND WITH CONTRAST TECHNIQUE: Multiplanar multisequence MR imaging of the pelvis was performed both before and after administration of intravenous contrast. CONTRAST:  9.16m GADAVIST GADOBUTROL 1 MMOL/ML IV SOLN COMPARISON:  CT pelvis dated April 15, 2021. MR abdomen and pelvis dated Jan 02, 2021. FINDINGS: Bones/Joint/Cartilage Abnormal marrow edema and enhancement of the lower coccyx with prominent surrounding inflammatory changes. No fracture or dislocation. Joint spaces are preserved. No joint effusion. Muscles and Tendons Intact.  No muscle edema or atrophy. Soft tissue Rectal wall thickening and perirectal inflammatory changes with unchanged 1.9 x 0.7 cm gas and air-fluid  collection along the left posterolateral aspect of the rectum (4 o'clock position) above the levator musculature (series 13, image 35). Again seen is a small sinus tract from this fluid collection extending to a larger fluid collection surrounding the coccyx measuring approximately 1.5 x 2.0 x 2.7 cm (series 13, image 39). Perirectal inflammatory changes have overall decreased compared to the MRI in April, but pericoccygeal inflammatory changes have increased. Prominent subcentimeter perirectal lymph nodes are similar to prior MRI. IMPRESSION: 1. Progressive osteomyelitis involving the lower coccyx with 2.7 cm abscess surrounding the coccyx that communicates with a small 1.9 cm perirectal abscess through a small sinus tract. 2. Pericoccygeal inflammatory changes have worsened since April. Perirectal inflammatory changes have improved since April. Electronically Signed   By: WTitus DubinM.D.   On: 04/19/2021 11:48    Anti-infectives: Anti-infectives (From admission, onward)    Start     Dose/Rate Route Frequency Ordered Stop   04/20/21 2200  cefTAZidime (FORTAZ) 2 g in sodium chloride 0.9 % 100 mL IVPB        2 g 200 mL/hr over 30 Minutes Intravenous Every 8 hours 04/20/21 1916     04/20/21 1945  metroNIDAZOLE (FLAGYL) IVPB 500 mg        500 mg 100 mL/hr over 60 Minutes Intravenous Every 12 hours 04/20/21 1857     04/19/21 2200  vancomycin (VANCOREADY) IVPB 1250 mg/250 mL        1,250 mg 166.7 mL/hr over 90 Minutes Intravenous Every 12 hours 04/19/21 0520  04/19/21 0500  piperacillin-tazobactam (ZOSYN) IVPB 3.375 g  Status:  Discontinued        3.375 g 12.5 mL/hr over 240 Minutes Intravenous Every 8 hours 04/19/21 0458 04/20/21 1857   04/19/21 0500  vancomycin (VANCOREADY) IVPB 2000 mg/400 mL        2,000 mg 200 mL/hr over 120 Minutes Intravenous  Once 04/19/21 0458 04/19/21 0800       Assessment/Plan: 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  Hopefully this will allow drainage of the perirectal and periccocygeal abscesses, along with IV abx for osteomyelitis.  When the infection clears, hopefully he can start Remicade per GI.  The surgical procedure has been discussed with the patient.  Potential risks, benefits, alternative treatments, and expected outcomes have been explained.  All of the patient's questions at this time have been answered.  The likelihood of reaching the patient's treatment goal is good.  The patient understand the proposed surgical procedure and wishes to proceed.   LOS: 2 days    Shawn [name] 04/21/2021

## 2021-04-21 NOTE — Anesthesia Preprocedure Evaluation (Signed)
Anesthesia Evaluation  Patient identified by MRN, date of birth, ID band Patient awake    Reviewed: Allergy & Precautions, NPO status , Patient's Chart, lab work & pertinent test results  Airway Mallampati: I  TM Distance: >3 FB Neck ROM: Full    Dental no notable dental hx. (+) Teeth Intact   Pulmonary Current Smoker and Patient abstained from smoking.,    Pulmonary exam normal        Cardiovascular hypertension, Pt. on medications Normal cardiovascular exam  Hx/o dilated CM  Echo 12/19/20 1. Left ventricular ejection fraction, by estimation, is 65 to 70%. The left ventricle has normal function. The left ventricle has no regional wall motion abnormalities. There is moderate left ventricular hypertrophy. Left ventricular diastolic parameters were normal.  2. Right ventricular systolic function is normal. The right ventricular size is normal. Tricuspid regurgitation signal is inadequate for assessing PA pressure.  3. The mitral valve is normal in structure. No evidence of mitral valve regurgitation. No evidence of mitral stenosis.  4. The aortic valve is tricuspid. Aortic valve regurgitation is not visualized. No aortic stenosis is present.  5. The inferior vena cava is normal in size with greater than 50% respiratory variability, suggesting right atrial pressure of 3 mmHg.    Neuro/Psych negative neurological ROS     GI/Hepatic negative GI ROS, Neg liver ROS, Hx/o proctitis Hx/o perirectal abscess x 2  Abnormal rectal exam   Endo/Other  negative endocrine ROS  Renal/GU Renal InsufficiencyRenal diseasenegative Renal ROS  negative genitourinary   Musculoskeletal Coccyx pain   Abdominal Normal abdominal exam  (+)   Peds  Hematology negative hematology ROS (+) Blood dyscrasia, anemia ,   Anesthesia Other Findings   Reproductive/Obstetrics                             Anesthesia  Physical  Anesthesia Plan  ASA: II  Anesthesia Plan: General   Post-op Pain Management:    Induction: Intravenous  PONV Risk Score and Plan: Propofol infusion, Ondansetron, Midazolam and Dexamethasone  Airway Management Planned: LMA  Additional Equipment: None  Intra-op Plan:   Post-operative Plan: Extubation in OR  Informed Consent: I have reviewed the patients History and Physical, chart, labs and discussed the procedure including the risks, benefits and alternatives for the proposed anesthesia with the patient or authorized representative who has indicated his/her understanding and acceptance.     Dental advisory given  Plan Discussed with: CRNA  Anesthesia Plan Comments:         Anesthesia Quick Evaluation

## 2021-04-21 NOTE — Op Note (Signed)
Preop diagnosis: Crohn's disease with perirectal abscess Postop diagnosis: Same Procedure performed: Examination under anesthesia with internal drainage of left posterior perirectal abscess Surgeon:Colt Martelle K Emsley Custer Anesthesia: General Indications: This is a 35 year old male with suspected Crohn's disease who has had previous horseshoe perirectal abscess.  He presents with evidence of inflammation and a small abscess in the left posterior region over the perirectal tissue with a inflammatory tract heading towards a fluid collection near the tip of his coccyx.  He also has evidence of osteomyelitis of his coccyx.  He presents now for examination under anesthesia with internal drainage of the abscess.  Description of procedure: The patient is brought to the operating room and placed in the supine position on the operating room table.  After an adequate level of general anesthesia was obtained, his legs were placed in the lithotomy position in yellowfin stirrups.  His perineum was prepped with Betadine and draped in sterile fashion.  A timeout was taken to ensure the proper patient and proper procedure.  Using a lubricated finger, I dilated his anal canal.  There is obvious scarring posteriorly.  He also has some chronic scarring anteriorly in the perineum.  There are no external signs of inflammation or abscess.  We are able to insert a retractor.  In the left posterior perirectal tissues there is an area of firmness.  Using cautery, I made a 1 cm opening in this area.  A small amount of fluid was expressed.  This is not grossly purulent.  I explored the wound with my finger.  There were no other fluid collections that are palpated.  The wound was packed with Surgicel.  Gelfoam roll was placed in the anal canal.  The retractor was removed.  A dressing is applied.  The patient is extubated and brought to the recovery room in stable condition.  All sponge, instrument, and needle counts are correct.  Imogene Burn.  Georgette Dover, MD, [facility] Surgery  General Surgery   04/21/2021 10:30 AM

## 2021-04-21 NOTE — Progress Notes (Signed)
[facility] Progress Note    Since last GI note: Underwent EUA today in the OR, see op note. He is resting in his room now.  Objective: Vital signs in last 24 hours: Temp:  [97.1 F (36.2 C)-98.5 F (36.9 C)] 97.1 F (36.2 C) (08/30 1115) Pulse Rate:  [57-80] 67 (08/30 1115) Resp:  [10-18] 16 (08/30 1115) BP: (103-138)/(67-87) 125/86 (08/30 1115) SpO2:  [94 %-100 %] 100 % (08/30 1115) Weight:  [93 kg] 93 kg (08/30 0900) Last BM Date: 04/18/21 General: alert and oriented times 3 Heart: regular rate and rythm Abdomen: soft, non-tender, non-distended, normal bowel sounds  Lab Results: Recent Labs    04/19/21 0408 04/19/21 1622 04/20/21 0114  WBC 11.5* 12.7* 11.8*  HGB 12.5* 11.8* 11.6*  PLT 357 348 324  MCV 94.1 94.1 94.4   Recent Labs    04/19/21 0408 04/19/21 1622 04/20/21 0114 04/21/21 0736  NA 137  --  137 138  K 4.1  --  4.1 4.5  CL 108  --  111 109  CO2 16*  --  21* 22  GLUCOSE 107*  --  91 111*  BUN 17  --  17 16  CREATININE 1.58* 1.62* 1.62* 1.55*  CALCIUM 9.1  --  8.5* 8.7*   Recent Labs    04/19/21 0408 04/20/21 0114  PROT 8.0 7.5  ALBUMIN 3.5 3.1*  AST 22 17  ALT 31 25  ALKPHOS 93 90  BILITOT 0.7 0.8   Recent Labs    04/19/21 0408  INR 1.0      Medications: Scheduled Meds:  enoxaparin (LOVENOX) injection  40 mg Subcutaneous Daily   Continuous Infusions:  cefTAZidime (FORTAZ)  IV 2 g (04/21/21 0525)   metronidazole 500 mg (04/20/21 2052)   vancomycin 1,250 mg (04/20/21 2201)   PRN Meds:.acetaminophen **OR** acetaminophen, HYDROmorphone (DILAUDID) injection, ondansetron **OR** ondansetron (ZOFRAN) IV, oxyCODONE, polyethylene glycol  Assessment/Plan: 35 y.o. male with perirectal, sacral infection that is almost certainly from isolated anorectal IBD, crohn's disease  Greatly appreciate the surgical and ID help with him.  We are working on starting biologics (remicade) as an outpatient in the next few weeks, timing to  be decided based on this surgical wound healing and at least another 2-3 weeks of Abx (probably oral) to assure as best we can that the infection is gone prior to starting remicade.  Our office is looking into the logistics now.    Milus Banister, MD  04/21/2021, 12:41 PM [facility] Pager [phone]

## 2021-04-21 NOTE — Anesthesia Postprocedure Evaluation (Signed)
Anesthesia Post Note  Patient: Shawn [name]  Procedure(s) Performed: EXAM UNDER ANESTHESIA , DRAINAGE OF PERIRECTAL ABSCESS     Patient location during evaluation: PACU Anesthesia Type: General Level of consciousness: awake and sedated Pain management: pain level controlled Vital Signs Assessment: post-procedure vital signs reviewed and stable Respiratory status: spontaneous breathing Cardiovascular status: stable Postop Assessment: no apparent nausea or vomiting Anesthetic complications: no   No notable events documented.  Last Vitals:  Vitals:   04/21/21 1055 04/21/21 1115  BP: 113/77 125/86  Pulse: 63 67  Resp: 10 16  Temp: (!) 36.2 C (!) 36.2 C  SpO2: 98% 100%    Last Pain:  Vitals:   04/21/21 1115  TempSrc: Oral  PainSc:                  Huston Foley

## 2021-04-21 NOTE — Progress Notes (Signed)
[facility] Progress Note  Patient: Shawn [name]    PPI:[id number]  DOA: 04/19/2021     Date of Service: the patient was seen and examined on 04/21/2021  Brief hospital course: Past medical history of perirectal abscess, HTN, smoker, osteomyelitis, hidradenitis.  Presents with complaints of sacral pain.  Found to have worsening osteomyelitis as well as recurrent rectal abscess. ID, GI, general surgery consulted. Currently plan is continue IV antibiotics, follow-up on ID recommendation regarding duration of the antibiotics  Subjective: Pain still present.  Improving.  No nausea or vomiting.  No fever no chills.  Still constipated but passing gas.  Assessment and Plan: 1.  Proctitis Coccygeal osteomyelitis Consult for Crohn's disease Fistulization from rectum to coccyx GI consulted. Biopsies not confirmatory. Currently on IV antibiotics.  Changing IV vancomycin and Zosyn to IV Fortaz and Flagyl and IV vancomycin. No indication for initiation of IV steroids or immunomodulator therapy for suspected IBD. ID consulted recommended continue IV antibiotics. General surgery consulted recommend EUA, I&D done on 8/30.   Monitor recommendation.  Continue pain control. We will follow recommendation from Surgery and ID.  2.  Essential hypertension Blood pressure stable.  Monitor.  3.  Chronic kidney disease stage III Renal function stable. Avoiding cefepime but Continue IV fluids.  4.  Metabolic acidosis Non-anion gap.  Improving with IV hydration.  Monitor.  5.  Leukocytosis Improving.  Scheduled Meds:  enoxaparin (LOVENOX) injection  40 mg Subcutaneous Daily   Continuous Infusions:  cefTAZidime (FORTAZ)  IV 2 g (04/21/21 1315)   metronidazole 500 mg (04/20/21 2052)   vancomycin 1,250 mg (04/20/21 2201)   PRN Meds: acetaminophen **OR** acetaminophen, HYDROmorphone (DILAUDID) injection, ondansetron **OR** ondansetron (ZOFRAN) IV, oxyCODONE, polyethylene glycol  Body mass  index is 27.05 kg/m.       DVT Prophylaxis:   enoxaparin (LOVENOX) injection 40 mg Start: 04/19/21 1000 SCDs Start: 04/19/21 0630   Advance goals of care discussion: Pt is Full code.  Family Communication: no family was present at bedside, at the time of interview.   Data Reviewed: I have personally reviewed and interpreted daily labs, tele strips, imaging. Electrolytes stable.  Serum creatinine mildly improving from 1.6-1.55.  Physical Exam:  General: Appear in mild distress, no Rash; Oral Mucosa Clear, moist. no Abnormal Neck Mass Or lumps, Conjunctiva normal  Cardiovascular: S1 and S2 Present, no Murmur, Respiratory: good respiratory effort, Bilateral Air entry present and CTA, no Crackles, no wheezes Abdomen: Bowel Sound present, Soft and no tenderness Extremities: no Pedal edema Neurology: alert and oriented to time, place, and person affect appropriate. no new focal deficit Gait not checked due to patient safety concerns  Vitals:   04/21/21 1045 04/21/21 1055 04/21/21 1115 04/21/21 1459  BP: 104/86 113/77 125/86 127/78  Pulse: 61 63 67 63  Resp: 13 10 16 17   Temp:  (!) 97.2 F (36.2 C) (!) 97.1 F (36.2 C) 98.3 F (36.8 C)  TempSrc:   Oral Oral  SpO2: 98% 98% 100% 98%  Weight:      Height:        Disposition:  Status is: Inpatient  Remains inpatient appropriate because:Ongoing diagnostic testing needed not appropriate for outpatient work up and IV treatments appropriate due to intensity of illness or inability to take PO  Dispo: The patient is from: Home              Anticipated d/c is to: Home              Patient currently  is not medically stable to d/c.   Difficult to place patient No        Time spent: 35 minutes. I reviewed all nursing notes, pharmacy notes, vitals, pertinent old records. I have discussed plan of care as described above with RN.  Author: Berle Mull, MD [facility] 04/21/2021 6:42 PM  To reach On-call, see care teams to  locate the attending and reach out via [email or web address]. Between 7PM-7AM, please contact night-coverage If you still have difficulty reaching the attending provider, please page the [facility] (Director on Call) for [facility] on amion for assistance.

## 2021-04-21 NOTE — Plan of Care (Signed)
  Problem: Activity: Goal: Risk for activity intolerance will decrease Outcome: Progressing   

## 2021-04-21 NOTE — Transfer of Care (Signed)
Immediate Anesthesia Transfer of Care Note  Patient: Shawn [name]  Procedure(s) Performed: EXAM UNDER ANESTHESIA , DRAINAGE OF PERIRECTAL ABSCESS  Patient Location: PACU  Anesthesia Type:General  Level of Consciousness: drowsy and patient cooperative  Airway & Oxygen Therapy: Patient Spontanous Breathing and Patient connected to nasal cannula oxygen  Post-op Assessment: Report given to RN and Post -op Vital signs reviewed and stable  Post vital signs: Reviewed and stable  Last Vitals:  Vitals Value Taken Time  BP 103/69 04/21/21 1030  Temp 36.2 C 04/21/21 1030  Pulse 56 04/21/21 1034  Resp 11 04/21/21 1034  SpO2 99 % 04/21/21 1034  Vitals shown include unvalidated device data.  Last Pain:  Vitals:   04/21/21 1030  TempSrc:   PainSc: 0-No pain      Patients Stated Pain Goal: 0 (34/94/94 4739)  Complications: No notable events documented.

## 2021-04-21 NOTE — Anesthesia Procedure Notes (Signed)
Procedure Name: LMA Insertion Date/Time: 04/21/2021 9:45 AM Performed by: Renato Shin, CRNA Pre-anesthesia Checklist: Patient identified, Emergency Drugs available, Suction available and Patient being monitored Patient Re-evaluated:Patient Re-evaluated prior to induction Oxygen Delivery Method: Circle system utilized Preoxygenation: Pre-oxygenation with 100% oxygen Induction Type: IV induction Ventilation: Mask ventilation without difficulty LMA: LMA inserted LMA Size: 5.0 Tube type: Oral Number of attempts: 1 Placement Confirmation: positive ETCO2 and breath sounds checked- equal and bilateral Tube secured with: Tape Dental Injury: Teeth and Oropharynx as per pre-operative assessment

## 2021-04-22 ENCOUNTER — Telehealth: Payer: Self-pay

## 2021-04-22 ENCOUNTER — Other Ambulatory Visit: Payer: Self-pay

## 2021-04-22 DIAGNOSIS — R933 Abnormal findings on diagnostic imaging of other parts of digestive tract: Secondary | ICD-10-CM | POA: Diagnosis not present

## 2021-04-22 DIAGNOSIS — M869 Osteomyelitis, unspecified: Secondary | ICD-10-CM | POA: Diagnosis not present

## 2021-04-22 LAB — RENAL FUNCTION PANEL
Albumin: 2.9 g/dL — ABNORMAL LOW (ref 3.5–5.0)
Anion gap: 8 (ref 5–15)
BUN: 21 mg/dL — ABNORMAL HIGH (ref 6–20)
CO2: 18 mmol/L — ABNORMAL LOW (ref 22–32)
Calcium: 8.7 mg/dL — ABNORMAL LOW (ref 8.9–10.3)
Chloride: 110 mmol/L (ref 98–111)
Creatinine, Ser: 1.6 mg/dL — ABNORMAL HIGH (ref 0.61–1.24)
GFR, Estimated: 57 mL/min — ABNORMAL LOW (ref >60)
Glucose, Bld: 126 mg/dL — ABNORMAL HIGH (ref 70–99)
Phosphorus: 2.8 mg/dL (ref 2.5–4.6)
Potassium: 4.5 mmol/L (ref 3.5–5.1)
Sodium: 136 mmol/L (ref 135–145)

## 2021-04-22 MED ORDER — OXYCODONE HCL 5 MG PO TABS: 5.0000 mg | ORAL_TABLET | ORAL | 0 refills | Status: DC | PRN

## 2021-04-22 MED ORDER — DOCUSATE SODIUM 100 MG PO CAPS
100.0000 mg | ORAL_CAPSULE | Freq: Two times a day (BID) | ORAL | Status: DC
  Administered 2021-04-22: 100 mg via ORAL
  Filled 2021-04-22: qty 1

## 2021-04-22 MED ORDER — AMOXICILLIN-POT CLAVULANATE 875-125 MG PO TABS: 1.0000 | ORAL_TABLET | Freq: Two times a day (BID) | ORAL | 0 refills | Status: AC

## 2021-04-22 MED ORDER — METHOCARBAMOL 750 MG PO TABS: 750.0000 mg | ORAL_TABLET | Freq: Four times a day (QID) | ORAL | 0 refills | Status: DC | PRN

## 2021-04-22 MED ORDER — METHOCARBAMOL 750 MG PO TABS: 750.0000 mg | ORAL_TABLET | Freq: Four times a day (QID) | ORAL | Status: DC | PRN

## 2021-04-22 MED ORDER — AMOXICILLIN-POT CLAVULANATE 875-125 MG PO TABS
1.0000 | ORAL_TABLET | Freq: Two times a day (BID) | ORAL | Status: DC
  Administered 2021-04-22: 1 via ORAL
  Filled 2021-04-22: qty 1

## 2021-04-22 MED ORDER — ACETAMINOPHEN 500 MG PO TABS: 1000.0000 mg | ORAL_TABLET | Freq: Four times a day (QID) | ORAL | 0 refills | Status: DC

## 2021-04-22 MED ORDER — ACETAMINOPHEN 500 MG PO TABS
1000.0000 mg | ORAL_TABLET | Freq: Four times a day (QID) | ORAL | Status: DC
  Administered 2021-04-22: 1000 mg via ORAL
  Filled 2021-04-22: qty 2

## 2021-04-22 NOTE — Progress Notes (Signed)
Had a conversation with Marygrace Drought, RN regarding pt's discharge.  She stated that pt wanted to drive himself home and that she informed him he cannot drive while taking prescription pain medicine. She stated that she asked him if he could find a ride and if not we could get him transportation and to let this RN know.  Per Marita Kansas, he informed her that he could get a ride home. This RN went into pt's room to ask when his ride would be coming and to make sure he had transportation and patient nor any belongings were in the room.  Eliezer [place]

## 2021-04-22 NOTE — Progress Notes (Signed)
Discharge instructions (including medications) discussed with and copy provided to patient/caregiver 

## 2021-04-22 NOTE — Discharge Summary (Signed)
Physician Discharge Summary  Shawn [name] WUJ:[id number] DOB: 05/11/1986 DOA: 04/19/2021  PCP: Charlott Rakes, MD  Admit date: 04/19/2021 Discharge date: 04/22/2021  Time spent: 26 minutes  Recommendations for Outpatient Follow-up:  Complete Augmentin for 21 days and follow-up in the outpatient setting with ID Dr. 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 Johney Maine, CKD 2-3?  Hemorrhagic proteinaceous renal cysts on CT 05/2020, macrocytic anemia, HTN Prior admission 5/10-5/14/2022 due to extensive pelvic inflammation and suspected coccygeal osteo-ID surgery and GI consulted-patient needed 6 weeks of antibiotics-patient obtained PICC line and was to follow-up in the outpatient setting with GI and ID Underwent colonoscopy 01/09/2021 pathology = chronic colitis Developed recurrence of sacral pain-seen by GI outpatient setting 8/23 CT abdomen pelvis = circumferential thickening lower rectum inflammatory fat stranding contrast-enhancement suspicious for osteo--2.7 cm abscess around coccyx communicating with 1.9 cm perirectal abscess Found to have white count of 11 BUN/creatinine 17/1.5 Rx Zosyn morphine GI, ID consulted and eventually general surgery felt patient-underwent EUA 04/21/2021 with internal drainage of the left posterior perirectal abscess--abscess drainage did not seem to be purulent and no cultures were  obtained  Patient was seen ultimately by infectious disease as well as by general surgery and felt to be stable for discharge on 04/22/2021  ID Dr. Linus Salmons discussed the case with me on 8/31 and recommended that patient follow-up in the outpatient setting with them  Cultures were not obtained because only serous fluid apparently was drawn off of the examination under anesthesia therefore he feels comfortable prescribing 3 weeks of Augmentin and can be followed for osteomyelitis on further imaging in the outpatient setting  He was lost to follow-up in the past by GI and it is felt that he may probably have Crohn's disease and may benefit once he is treated fully from [place] will follow-up with Dr. Ardis Hughs and [facility]  Patient will also get the prescribed will be prescribed limited amounts of pain medication  He had met maximal hospital benefit on the day of discharge and was felt to be stable for discharge home   Discharge Exam: Vitals:   04/22/21 0526 04/22/21 0748  BP: (!) 144/94 109/63  Pulse: 68 70  Resp: 18 18  Temp: 98.1 F (36.7 C) 98.2 F (36.8 C)  SpO2: 100% 98%    Subj on day of d/c   Sleepy does not seem like he wants to talk  General Exam on discharge  EOMI NCAT no focal deficit Chest clear Declines further exam Understands the need for follow-up  Discharge Instructions   Discharge Instructions     Diet - low sodium heart healthy   Complete by: As directed    Discharge instructions   Complete by: As directed    Patient will be followed in the outpatient setting both general surgery and ID to follow-up the area that had been examined under anesthesia-this was not felt to be infectious on discharge--however patient will continue antibiotics which would cover both abscess and possible osteomyelitis  in the outpatient setting Patient does have high normal blood sugars and may benefit from diabetic screening in the outpatient setting with regards  to this He should keep all of his regular appointments Note that his lisinopril was stopped this admission  For pain he can take Tylenol as first choice ibuprofen sparingly as a second choice and for severe pain he can take oxycodone Oxy IR Limited prescription given for expected postop pain   Discharge wound care:   Complete by: As directed    As per general surgery   Increase activity slowly   Complete by: As directed       Allergies as of 04/22/2021       Reactions   Shrimp [shellfish Allergy] Shortness Of Breath        Medication List     STOP taking these medications    cyanocobalamin 1000 MCG tablet   lisinopril 10 MG tablet Commonly known as: ZESTRIL       TAKE these medications    acetaminophen 500 MG tablet Commonly known as: TYLENOL Take 2 tablets (1,000 mg total) by mouth every 6 (six) hours.   amoxicillin-clavulanate 875-125 MG tablet Commonly known as: AUGMENTIN Take 1 tablet by mouth every 12 (twelve) hours for 42 doses.   IBUPROFEN PO Take 3 tablets by mouth daily as needed (pain).   methocarbamol 750 MG tablet Commonly known as: ROBAXIN Take 1 tablet (750 mg total) by mouth every 6 (six) hours as needed for muscle spasms.   oxyCODONE 5 MG immediate release tablet Commonly known as: Oxy IR/ROXICODONE Take 1-2 tablets (5-10 mg total) by mouth every 4 (four) hours as needed for moderate pain.               Discharge Care Instructions  (From admission, onward)           Start     Ordered   04/22/21 0000  Discharge wound care:       Comments: As per general surgery   04/22/21 0954           Allergies  Allergen Reactions   Shrimp [Shellfish Allergy] Shortness Of Breath    Follow-up Information     Ileana Roup, MD Follow up on 05/20/2021.   Specialties: General Surgery, Colon and Rectal Surgery Why: 2pm. Please arrive 30 minutes prior to your appointment for paperwork. Please bring a copy of your photo ID and  insurance card. Contact information: [place] [address] [phone]                  The results of significant diagnostics from this hospitalization (including imaging, microbiology, ancillary and laboratory) are listed below for reference.    Significant Diagnostic Studies: CT PELVIS W WO CONTRAST  Result Date: 04/15/2021 CLINICAL DATA:  History of rectal abscess, recent hospitalization and IV antibiotic treatment, recurrent low rectal pain EXAM: CT PELVIS WITH AND WITHOUT CONTRAST TECHNIQUE: Multidetector CT imaging of the pelvis was performed following the standard protocol before and following the bolus administration of intravenous contrast. CONTRAST:  137m OMNIPAQUE IOHEXOL 300 MG/ML  SOLN COMPARISON:  CT pelvis, 12/20/2020, MR pelvis, 01/02/2021 FINDINGS: Urinary Tract:  No abnormality visualized. Bowel: Redemonstrated circumferential thickening of the low rectum (series 2, image 37). At the left aspect of the low rectum, above the levator musculature, there is a probable small air containing abscess cavity measuring 1.3 x 0.7 cm, better detailed by prior MR and previously fluid-filled, measuring approximately 3.0 x  0.9 cm at that time (series 6, image 36, series 8, image 52). Numerous prominent subcentimeter perirectal lymph nodes, unchanged (series 6, image 25). Inflammatory fat stranding and contrast enhancement again extends into the posterior soft tissues and appears to involve the tip of the coccyx (series 6, image 39). Vascular/Lymphatic: No pathologically enlarged lymph nodes. No significant vascular abnormality seen. Reproductive:  No mass or other significant abnormality Other:  None. Musculoskeletal: No suspicious bone lesions identified. IMPRESSION: 1. Redemonstrated circumferential thickening of the low rectum. At the left aspect of the low rectum, above the levator musculature, there is a probable small air containing abscess cavity measuring 1.3 x 0.7  cm, better detailed by prior MR and previously fluid-filled, measuring approximately 3.0 x 0.9 cm at that time. Findings are consistent with persistent, although improved abscess. 2. Inflammatory fat stranding and contrast enhancement again extends into the posterior soft tissues and appears to involve the tip of the coccyx. As on prior examination, this is suspicious for osteomyelitis. 3. 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 Consider repeat contrast enhanced MR of the pelvis to further evaluate. 4. Numerous prominent subcentimeter perirectal lymph nodes, unchanged and likely reactive. Electronically Signed   By: Eddie Candle M.D.   On: 04/15/2021 15:13   MR PELVIS W WO CONTRAST  Result Date: 04/19/2021 CLINICAL DATA:  Sacral pain. History of perirectal abscess and sacral osteomyelitis. EXAM: MRI PELVIS WITHOUT AND WITH CONTRAST TECHNIQUE: Multiplanar multisequence MR imaging of the pelvis was performed both before and after administration of intravenous contrast. CONTRAST:  9.28m GADAVIST GADOBUTROL 1 MMOL/ML IV SOLN COMPARISON:  CT pelvis dated April 15, 2021. MR abdomen and pelvis dated Jan 02, 2021. FINDINGS: Bones/Joint/Cartilage Abnormal marrow edema and enhancement of the lower coccyx with prominent surrounding inflammatory changes. No fracture or dislocation. Joint spaces are preserved. No joint effusion. Muscles and Tendons Intact.  No muscle edema or atrophy. Soft tissue Rectal wall thickening and perirectal inflammatory changes with unchanged 1.9 x 0.7 cm gas and air-fluid collection along the left posterolateral aspect of the rectum (4 o'clock position) above the levator musculature (series 13, image 35). Again seen is a small sinus tract from this fluid collection extending to a larger fluid collection surrounding the coccyx measuring  approximately 1.5 x 2.0 x 2.7 cm (series 13, image 39). Perirectal inflammatory changes have overall decreased compared to the MRI in April, but pericoccygeal inflammatory changes have increased. Prominent subcentimeter perirectal lymph nodes are similar to prior MRI. IMPRESSION: 1. Progressive osteomyelitis involving the lower coccyx with 2.7 cm abscess surrounding the coccyx that communicates with a small 1.9 cm perirectal abscess through a small sinus tract. 2. Pericoccygeal inflammatory changes have worsened since April. Perirectal inflammatory changes have improved since April. Electronically Signed   By: WTitus DubinM.D.   On: 04/19/2021 11:48   DG Chest Port 1 View  Result Date: 04/19/2021 CLINICAL DATA:  35year old male with pain. EXAM: PORTABLE CHEST 1 VIEW COMPARISON:  Portable chest 10/25/2019 and earlier. FINDINGS: Portable AP semi upright views at 0436 hours. Lung volumes and mediastinal contours are within normal limits. Visualized tracheal air column is within normal limits. Allowing for portable technique the lungs are clear. No pneumothorax. No pleural effusion is evident. No osseous abnormality identified. IMPRESSION: Negative portable chest. Electronically Signed   By: HGenevie AnnM.D.   On: 04/19/2021 04:57    Microbiology:  Recent Results (from the past 240 hour(s))  Culture, blood (Routine x 2)     Status: None (Preliminary result)   Collection Time: 04/19/21  4:16 AM   Specimen: BLOOD  Result Value Ref Range Status   Specimen Description BLOOD LEFT ANTECUBITAL  Final   Special Requests   Final    BOTTLES DRAWN AEROBIC AND ANAEROBIC Blood Culture adequate volume   Culture   Final    NO GROWTH 3 DAYS Performed at [facility], [address], [place], [place] [address]    Report Status PENDING  Incomplete  Culture, blood (Routine x 2)     Status: None (Preliminary result)   Collection Time: 04/19/21  5:10 AM   Specimen: BLOOD RIGHT ARM  Result Value Ref Range Status    Specimen Description BLOOD RIGHT ARM  Final   Special Requests   Final    BOTTLES DRAWN AEROBIC AND ANAEROBIC Blood Culture results may not be optimal due to an inadequate volume of blood received in culture bottles   Culture   Final    NO GROWTH 3 DAYS Performed at [facility], [place] [address], [place], [place] [address]    Report Status PENDING  Incomplete  Resp Panel by RT-PCR (Flu A&B, Covid) Nasopharyngeal Swab     Status: None   Collection Time: 04/19/21  5:12 AM   Specimen: Nasopharyngeal Swab; Nasopharyngeal(NP) swabs in vial transport medium  Result Value Ref Range Status   SARS Coronavirus 2 by RT PCR NEGATIVE NEGATIVE Final    Comment: (NOTE) SARS-CoV-2 target nucleic acids are NOT DETECTED.  The SARS-CoV-2 RNA is generally detectable in upper respiratory specimens during the acute phase of infection. The lowest concentration of SARS-CoV-2 viral copies this assay can detect is 138 copies/mL. A negative result does not preclude SARS-Cov-2 infection and should not be used as the sole basis for treatment or other patient management decisions. A negative result may occur with  improper specimen collection/handling, submission of specimen other than nasopharyngeal swab, presence of viral mutation(s) within the areas targeted by this assay, and inadequate number of viral copies(<138 copies/mL). A negative result must be combined with clinical observations, patient history, and epidemiological information. The expected result is Negative.  Fact Sheet for Patients:  EntrepreneurPulse.com.au  Fact Sheet for Healthcare Providers:  IncredibleEmployment.be  This test is no t yet approved or cleared by the [place] FDA and  has been authorized for detection and/or diagnosis of SARS-CoV-2 by FDA under an Emergency Use Authorization (EUA). This EUA will remain  in effect (meaning this test can be used) for the duration of  the COVID-19 declaration under Section 564(b)(1) of the Act, 21 U.S.C.section 360bbb-3(b)(1), unless the authorization is terminated  or revoked sooner.       Influenza A by PCR NEGATIVE NEGATIVE Final   Influenza B by PCR NEGATIVE NEGATIVE Final    Comment: (NOTE) The Xpert Xpress SARS-CoV-2/FLU/RSV plus assay is intended as an aid in the diagnosis of influenza from Nasopharyngeal swab specimens and should not be used as a sole basis for treatment. Nasal washings and aspirates are unacceptable for Xpert Xpress SARS-CoV-2/FLU/RSV testing.  Fact Sheet for Patients: EntrepreneurPulse.com.au  Fact Sheet for Healthcare Providers: IncredibleEmployment.be  This test is not yet approved or cleared by the [place] FDA and has been authorized for detection and/or diagnosis of SARS-CoV-2 by FDA under an Emergency Use Authorization (EUA). This EUA will remain in effect (meaning this test can be used) for  the duration of the COVID-19 declaration under Section 564(b)(1) of the Act, 21 U.S.C. section 360bbb-3(b)(1), unless the authorization is terminated or revoked.  Performed at [facility], [place] [address] [place], [place] [address]   Surgical pcr screen     Status: None   Collection Time: 04/21/21  8:45 AM   Specimen: Nasal Mucosa; Nasal Swab  Result Value Ref Range Status   MRSA, PCR NEGATIVE NEGATIVE Final   Staphylococcus aureus NEGATIVE NEGATIVE Final    Comment: (NOTE) The Xpert SA Assay (FDA approved for NASAL specimens in patients 100 years of age and older), is one component of a comprehensive surveillance program. It is not intended to diagnose infection nor to guide or monitor treatment. Performed at [facility], [place] [address], [place], [place] [address]      Labs: Basic Metabolic Panel: Recent Labs  Lab 04/19/21 0408 04/19/21 1622 04/20/21 0114 04/21/21 0736 04/22/21 0029  NA 137  --  137 138 136  K 4.1   --  4.1 4.5 4.5  CL 108  --  111 109 110  CO2 16*  --  21* 22 18*  GLUCOSE 107*  --  91 111* 126*  BUN 17  --  17 16 21*  CREATININE 1.58* 1.62* 1.62* 1.55* 1.60*  CALCIUM 9.1  --  8.5* 8.7* 8.7*  MG  --   --  2.1  --   --   PHOS  --   --  3.8  --  2.8    Liver Function Tests: Recent Labs  Lab 04/19/21 0408 04/20/21 0114 04/22/21 0029  AST 22 17  --   ALT 31 25  --   ALKPHOS 93 90  --   BILITOT 0.7 0.8  --   PROT 8.0 7.5  --   ALBUMIN 3.5 3.1* 2.9*    No results for input(s): LIPASE, AMYLASE in the last 168 hours. No results for input(s): AMMONIA in the last 168 hours. CBC: Recent Labs  Lab 04/19/21 0408 04/19/21 1622 04/20/21 0114  WBC 11.5* 12.7* 11.8*  NEUTROABS 6.9  --   --   HGB 12.5* 11.8* 11.6*  HCT 36.7* 35.0* 33.6*  MCV 94.1 94.1 94.4  PLT 357 348 324    Cardiac Enzymes: No results for input(s): CKTOTAL, CKMB, CKMBINDEX, TROPONINI in the last 168 hours. BNP: BNP (last 3 results) No results for input(s): BNP in the last 8760 hours.  ProBNP (last 3 results) No results for input(s): PROBNP in the last 8760 hours.  CBG: No results for input(s): GLUCAP in the last 168 hours.     Signed:  Nita Sells MD   [facility] 04/22/2021, 3:57 PM

## 2021-04-22 NOTE — Discharge Summary (Deleted)
Nita Sells, MD  Physician  Hospitalist  Progress Notes     Signed  Date of Service:  04/22/2021  9:42 AM           Signed      Expand All Collapse All                                                                                                                                                                                                                                                                                                                Physician Discharge Summary  Honor Fairbank ZOX:[id number] DOB: Dec 27, 1985 DOA: 04/19/2021   PCP: Charlott Rakes, MD   Admit date: 04/19/2021 Discharge date: 04/22/2021   Time spent: 26 minutes   Recommendations for Outpatient Follow-up:  Complete Augmentin for 21 days and follow-up in the outpatient setting with ID Dr. 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 Johney Maine, CKD 2-3?  Hemorrhagic proteinaceous renal cysts on CT 05/2020, macrocytic anemia, HTN Prior admission 5/10-5/14/2022 due to extensive pelvic inflammation and suspected coccygeal osteo-ID surgery and GI consulted-patient needed 6 weeks of antibiotics-patient obtained PICC line and was to follow-up in the outpatient setting with GI and ID Underwent colonoscopy 01/09/2021 pathology =  chronic colitis Developed recurrence of sacral pain-seen by GI outpatient setting 8/23 CT abdomen pelvis = circumferential thickening lower rectum inflammatory fat stranding contrast-enhancement suspicious for osteo--2.7 cm abscess around coccyx communicating with 1.9 cm  perirectal abscess Found to have white count of 11 BUN/creatinine 17/1.5 Rx Zosyn morphine GI, ID consulted and eventually general surgery felt patient-underwent EUA 04/21/2021 with internal drainage of the left posterior perirectal abscess--abscess drainage did not seem to be purulent and no cultures were obtained  Patient was seen ultimately by infectious disease as well as by general surgery and felt to be stable for discharge on 04/22/2021  ID Dr. Linus Salmons discussed the case with me on 8/31 and recommended that patient follow-up in the outpatient setting with them  Cultures were not obtained because only serous fluid apparently was drawn off of the examination under anesthesia therefore he feels comfortable prescribing 3 weeks of Augmentin and can be followed for osteomyelitis on further imaging in the outpatient setting   He was lost to follow-up in the past by GI and it is felt that he may probably have Crohn's disease and may benefit once he is treated fully from [place] will follow-up with Dr. Ardis Hughs [facility]  Patient will also get the prescribed will be prescribed limited amounts of pain medication   He had met maximal hospital benefit on the day of discharge and was felt to be stable for discharge home     Discharge Exam:     Vitals:    04/22/21 0526 04/22/21 0748  BP: (!) 144/94 109/63  Pulse: 68 70  Resp: 18 18  Temp: 98.1 F (36.7 C) 98.2 F (36.8 C)  SpO2: 100% 98%      Subj on day of d/c   Sleepy does not seem like he wants to talk   General Exam on discharge   EOMI NCAT no focal deficit Chest clear Declines further exam Understands the need for follow-up   Discharge  Instructions     Discharge Instructions       Diet - low sodium heart healthy   Complete by: As directed      Discharge instructions   Complete by: As directed      Patient will be followed in the outpatient setting both general surgery and ID to follow-up the area that had been examined under anesthesia-this was not felt to be infectious on discharge--however patient will continue antibiotics which would cover both abscess and possible osteomyelitis in the outpatient setting Patient does have high normal blood sugars and may benefit from diabetic screening in the outpatient setting with regards to this He should keep all of his regular appointments Note that his lisinopril was stopped this admission   For pain he can take Tylenol as first choice ibuprofen sparingly as a second choice and for severe pain he can take oxycodone Oxy IR Limited prescription given for expected postop pain    Discharge wound care:   Complete by: As directed      As per general surgery    Increase activity slowly   Complete by: As directed           Allergies as of 04/22/2021         Reactions    Shrimp [shellfish Allergy] Shortness Of Breath            Medication List       STOP taking these medications     cyanocobalamin 1000 MCG tablet    lisinopril 10 MG tablet Commonly known as: ZESTRIL           TAKE these medications     acetaminophen 500 MG tablet Commonly known as: TYLENOL Take 2 tablets (1,000 mg  total) by mouth every 6 (six) hours.    amoxicillin-clavulanate 875-125 MG tablet Commonly known as: AUGMENTIN Take 1 tablet by mouth every 12 (twelve) hours for 42 doses.    IBUPROFEN PO Take 3 tablets by mouth daily as needed (pain).    methocarbamol 750 MG tablet Commonly known as: ROBAXIN Take 1 tablet (750 mg total) by mouth every 6 (six) hours as needed for muscle spasms.    oxyCODONE 5 MG immediate release tablet Commonly known as: Oxy IR/ROXICODONE Take 1-2 tablets  (5-10 mg total) by mouth every 4 (four) hours as needed for moderate pain.                        Discharge Care Instructions  (From admission, onward)                 Start     Ordered    04/22/21 0000   Discharge wound care:       Comments: As per general surgery   04/22/21 0954                    Allergies  Allergen Reactions   Shrimp [Shellfish Allergy] Shortness Of Breath          The results of significant diagnostics from this hospitalization (including imaging, microbiology, ancillary and laboratory) are listed below for reference.     Significant Diagnostic Studies:  Imaging Results  CT PELVIS W WO CONTRAST   Result Date: 04/15/2021 CLINICAL DATA:  History of rectal abscess, recent hospitalization and IV antibiotic treatment, recurrent low rectal pain EXAM: CT PELVIS WITH AND WITHOUT CONTRAST TECHNIQUE: Multidetector CT imaging of the pelvis was performed following the standard protocol before and following the bolus administration of intravenous contrast. CONTRAST:  158m OMNIPAQUE IOHEXOL 300 MG/ML  SOLN COMPARISON:  CT pelvis, 12/20/2020, MR pelvis, 01/02/2021 FINDINGS: Urinary Tract:  No abnormality visualized. Bowel: Redemonstrated circumferential thickening of the low rectum (series 2, image 37). At the left aspect of the low rectum, above the levator musculature, there is a probable small air containing abscess cavity measuring 1.3 x 0.7 cm, better detailed by prior MR and previously fluid-filled, measuring approximately 3.0 x 0.9 cm at that time (series 6, image 36, series 8, image 52). Numerous prominent subcentimeter perirectal lymph nodes, unchanged (series 6, image 25). Inflammatory fat stranding and contrast enhancement again extends into the posterior soft tissues and appears to involve the tip of the coccyx (series 6, image 39). Vascular/Lymphatic: No pathologically enlarged lymph nodes. No significant vascular abnormality seen. Reproductive:  No mass  or other significant abnormality Other:  None. Musculoskeletal: No suspicious bone lesions identified. IMPRESSION: 1. Redemonstrated circumferential thickening of the low rectum. At the left aspect of the low rectum, above the levator musculature, there is a probable small air containing abscess cavity measuring 1.3 x 0.7 cm, better detailed by prior MR and previously fluid-filled, measuring approximately 3.0 x 0.9 cm at that time. Findings are consistent with persistent, although improved abscess. 2. Inflammatory fat stranding and contrast enhancement again extends into the posterior soft tissues and appears to involve the tip of the coccyx. As on prior examination, this is suspicious for osteomyelitis. 3. 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 Consider repeat contrast enhanced MR of the pelvis to further evaluate. 4. Numerous prominent subcentimeter perirectal lymph nodes, unchanged  and likely reactive. Electronically Signed   By: Eddie Candle M.D.   On: 04/15/2021 15:13    MR PELVIS W WO CONTRAST   Result Date: 04/19/2021 CLINICAL DATA:  Sacral pain. History of perirectal abscess and sacral osteomyelitis. EXAM: MRI PELVIS WITHOUT AND WITH CONTRAST TECHNIQUE: Multiplanar multisequence MR imaging of the pelvis was performed both before and after administration of intravenous contrast. CONTRAST:  9.58m GADAVIST GADOBUTROL 1 MMOL/ML IV SOLN COMPARISON:  CT pelvis dated April 15, 2021. MR abdomen and pelvis dated Jan 02, 2021. FINDINGS: Bones/Joint/Cartilage Abnormal marrow edema and enhancement of the lower coccyx with prominent surrounding inflammatory changes. No fracture or dislocation. Joint spaces are preserved. No joint effusion. Muscles and Tendons Intact.  No muscle edema or atrophy. Soft tissue Rectal wall thickening and perirectal inflammatory  changes with unchanged 1.9 x 0.7 cm gas and air-fluid collection along the left posterolateral aspect of the rectum (4 o'clock position) above the levator musculature (series 13, image 35). Again seen is a small sinus tract from this fluid collection extending to a larger fluid collection surrounding the coccyx measuring approximately 1.5 x 2.0 x 2.7 cm (series 13, image 39). Perirectal inflammatory changes have overall decreased compared to the MRI in April, but pericoccygeal inflammatory changes have increased. Prominent subcentimeter perirectal lymph nodes are similar to prior MRI. IMPRESSION: 1. Progressive osteomyelitis involving the lower coccyx with 2.7 cm abscess surrounding the coccyx that communicates with a small 1.9 cm perirectal abscess through a small sinus tract. 2. Pericoccygeal inflammatory changes have worsened since April. Perirectal inflammatory changes have improved since April. Electronically Signed   By: WTitus DubinM.D.   On: 04/19/2021 11:48    DG Chest Port 1 View   Result Date: 04/19/2021 CLINICAL DATA:  35year old male with pain. EXAM: PORTABLE CHEST 1 VIEW COMPARISON:  Portable chest 10/25/2019 and earlier. FINDINGS: Portable AP semi upright views at 0436 hours. Lung volumes and mediastinal contours are within normal limits. Visualized tracheal air column is within normal limits. Allowing for portable technique the lungs are clear. No pneumothorax. No pleural effusion is evident. No osseous abnormality identified. IMPRESSION: Negative portable chest. Electronically Signed   By: HGenevie AnnM.D.   On: 04/19/2021 04:57       Microbiology:        Recent Results (from the past 240 hour(s))  Culture, blood (Routine x 2)     Status: None (Preliminary result)    Collection Time: 04/19/21  4:16 AM    Specimen: BLOOD  Result Value Ref Range Status    Specimen Description BLOOD LEFT ANTECUBITAL   Final    Special Requests     Final      BOTTLES DRAWN AEROBIC AND ANAEROBIC Blood  Culture adequate volume    Culture     Final      NO GROWTH 2 DAYS Performed at [facility] [address] [place] [address]     Report Status PENDING   Incomplete  Culture, blood (Routine x 2)     Status: None (Preliminary result)    Collection Time: 04/19/21  5:10 AM    Specimen: BLOOD RIGHT ARM  Result Value Ref Range Status    Specimen Description BLOOD RIGHT ARM   Final    Special Requests     Final      BOTTLES DRAWN AEROBIC AND ANAEROBIC Blood Culture results may not be optimal due to an inadequate volume of blood received in culture bottles  Culture     Final      NO GROWTH 2 DAYS Performed at [facility], [place] [address] [place], [place] [address]      Report Status PENDING   Incomplete  Resp Panel by RT-PCR (Flu A&B, Covid) Nasopharyngeal Swab     Status: None    Collection Time: 04/19/21  5:12 AM    Specimen: Nasopharyngeal Swab; Nasopharyngeal(NP) swabs in vial transport medium  Result Value Ref Range Status    SARS Coronavirus 2 by RT PCR NEGATIVE NEGATIVE Final      Comment: (NOTE) SARS-CoV-2 target nucleic acids are NOT DETECTED.   The SARS-CoV-2 RNA is generally detectable in upper respiratory specimens during the acute phase of infection. The lowest concentration of SARS-CoV-2 viral copies this assay can detect is 138 copies/mL. A negative result does not preclude SARS-Cov-2 infection and should not be used as the sole basis for treatment or other patient management decisions. A negative result may occur with  improper specimen collection/handling, submission of specimen other than nasopharyngeal swab, presence of viral mutation(s) within the areas targeted by this assay, and inadequate number of viral copies(<138 copies/mL). A negative result must be combined with clinical observations, patient history, and epidemiological information. The expected result is Negative.   Fact Sheet for Patients:   EntrepreneurPulse.com.au   Fact Sheet for Healthcare Providers:  IncredibleEmployment.be   This test is no t yet approved or cleared by the [place] FDA and  has been authorized for detection and/or diagnosis of SARS-CoV-2 by FDA under an Emergency Use Authorization (EUA). This EUA will remain  in effect (meaning this test can be used) for the duration of the COVID-19 declaration under Section 564(b)(1) of the Act, 21 U.S.C.section 360bbb-3(b)(1), unless the authorization is terminated  or revoked sooner.           Influenza A by PCR NEGATIVE NEGATIVE Final    Influenza B by PCR NEGATIVE NEGATIVE Final      Comment: (NOTE) The Xpert Xpress SARS-CoV-2/FLU/RSV plus assay is intended as an aid in the diagnosis of influenza from Nasopharyngeal swab specimens and should not be used as a sole basis for treatment. Nasal washings and aspirates are unacceptable for Xpert Xpress SARS-CoV-2/FLU/RSV testing.   Fact Sheet for Patients: EntrepreneurPulse.com.au   Fact Sheet for Healthcare Providers: IncredibleEmployment.be   This test is not yet approved or cleared by the [place] FDA and has been authorized for detection and/or diagnosis of SARS-CoV-2 by FDA under an Emergency Use Authorization (EUA). This EUA will remain in effect (meaning this test can be used) for the duration of the COVID-19 declaration under Section 564(b)(1) of the Act, 21 U.S.C. section 360bbb-3(b)(1), unless the authorization is terminated or revoked.   Performed at [facility], [place] [address], [place], [place] [address]    Surgical pcr screen     Status: None    Collection Time: 04/21/21  8:45 AM    Specimen: Nasal Mucosa; Nasal Swab  Result Value Ref Range Status    MRSA, PCR NEGATIVE NEGATIVE Final    Staphylococcus aureus NEGATIVE NEGATIVE Final      Comment: (NOTE) The Xpert SA Assay (FDA approved for NASAL  specimens in patients 61 years of age and older), is one component of a comprehensive surveillance program. It is not intended to diagnose infection nor to guide or monitor treatment. Performed at [facility], [place] [address] [place], [place] [address]  Labs: Basic Metabolic Panel: Last Labs          Recent Labs  Lab 04/19/21 0408 04/19/21 1622 04/20/21 0114 04/21/21 0736 04/22/21 0029  NA 137  --  137 138 136  K 4.1  --  4.1 4.5 4.5  CL 108  --  111 109 110  CO2 16*  --  21* 22 18*  GLUCOSE 107*  --  91 111* 126*  BUN 17  --  17 16 21*  CREATININE 1.58* 1.62* 1.62* 1.55* 1.60*  CALCIUM 9.1  --  8.5* 8.7* 8.7*  MG  --   --  2.1  --   --   PHOS  --   --  3.8  --  2.8      Liver Function Tests: Last Labs        Recent Labs  Lab 04/19/21 0408 04/20/21 0114 04/22/21 0029  AST 22 17  --   ALT 31 25  --   ALKPHOS 93 90  --   BILITOT 0.7 0.8  --   PROT 8.0 7.5  --   ALBUMIN 3.5 3.1* 2.9*      Last Labs   No results for input(s): LIPASE, AMYLASE in the last 168 hours.   Last Labs   No results for input(s): AMMONIA in the last 168 hours.   CBC: Last Labs        Recent Labs  Lab 04/19/21 0408 04/19/21 1622 04/20/21 0114  WBC 11.5* 12.7* 11.8*  NEUTROABS 6.9  --   --   HGB 12.5* 11.8* 11.6*  HCT 36.7* 35.0* 33.6*  MCV 94.1 94.1 94.4  PLT 357 348 324      Cardiac Enzymes: Last Labs   No results for input(s): CKTOTAL, CKMB, CKMBINDEX, TROPONINI in the last 168 hours.   BNP: BNP (last 3 results) Recent Labs (within last 365 days)  No results for input(s): BNP in the last 8760 hours.     ProBNP (last 3 results) Recent Labs (within last 365 days)  No results for input(s): PROBNP in the last 8760 hours.     CBG: Last Labs   No results for input(s): GLUCAP in the last 168 hours.           Signed:   Nita Sells MD   [facility] 04/22/2021, 9:54 AM               Note Details  Carollee Sires, MD File Time 04/22/2021 10:01 AM  Author Type Physician Status Signed  Last Editor Nita Sells, [place] # [phone] [place] Date 04/19/2021

## 2021-04-22 NOTE — Progress Notes (Addendum)
1 Day Post-Op  Subjective: CC: Complains of rectal pain after surgery. Passing flatus. No BM. Tolerating diet without n/v or abdominal pain.   Objective: Vital signs in last 24 hours: Temp:  [97.1 F (36.2 C)-98.9 F (37.2 C)] 98.2 F (36.8 C) (08/31 0748) Pulse Rate:  [60-80] 70 (08/31 0748) Resp:  [10-18] 18 (08/31 0748) BP: (103-144)/(63-94) 109/63 (08/31 0748) SpO2:  [94 %-100 %] 98 % (08/31 0748) Weight:  [93 kg] 93 kg (08/30 0900) Last BM Date: 04/18/21  Intake/Output from previous day: 08/30 0701 - 08/31 0700 In: 1340 [P.O.:240; I.V.:700; IV Piggyback:400] Out: 50 [Blood:50] Intake/Output this shift: No intake/output data recorded.  PE: Gen:  Alert, NAD, pleasant Pulm: Rate and effort normal Abd: Soft, ND, NT +BS Rectal: Chaperone present, RN. Patient with tenderness with palpation over the coccyx. Otherwise NT. There is an old, well healed scar from prior I&D of peri-rectal abscess. There is no external erythema, heat, induration, fluctuance or drainage. Anus appears normal. Some dried blood noted without any active drainage from anus.  Psych: A&Ox3  Skin: no rashes noted, warm and dry  Lab Results:  Recent Labs    04/19/21 1622 04/20/21 0114  WBC 12.7* 11.8*  HGB 11.8* 11.6*  HCT 35.0* 33.6*  PLT 348 324   BMET Recent Labs    04/21/21 0736 04/22/21 0029  NA 138 136  K 4.5 4.5  CL 109 110  CO2 22 18*  GLUCOSE 111* 126*  BUN 16 21*  CREATININE 1.55* 1.60*  CALCIUM 8.7* 8.7*   PT/INR No results for input(s): LABPROT, INR in the last 72 hours. CMP     Component Value Date/Time   NA 136 04/22/2021 0029   NA 141 11/18/2020 1117   K 4.5 04/22/2021 0029   CL 110 04/22/2021 0029   CO2 18 (L) 04/22/2021 0029   GLUCOSE 126 (H) 04/22/2021 0029   BUN 21 (H) 04/22/2021 0029   BUN 17 11/18/2020 1117   CREATININE 1.60 (H) 04/22/2021 0029   CALCIUM 8.7 (L) 04/22/2021 0029   PROT 7.5 04/20/2021 0114   PROT 7.8 11/18/2020 1117   ALBUMIN 2.9 (L)  04/22/2021 0029   ALBUMIN 4.0 11/18/2020 1117   AST 17 04/20/2021 0114   ALT 25 04/20/2021 0114   ALKPHOS 90 04/20/2021 0114   BILITOT 0.8 04/20/2021 0114   BILITOT 0.3 11/18/2020 1117   GFRNONAA 57 (L) 04/22/2021 0029   GFRAA >60 01/08/2020 1830   Lipase     Component Value Date/Time   LIPASE 35 06/04/2020 1148    Studies/Results: No results found.  Anti-infectives: Anti-infectives (From admission, onward)    Start     Dose/Rate Route Frequency Ordered Stop   04/20/21 2200  cefTAZidime (FORTAZ) 2 g in sodium chloride 0.9 % 100 mL IVPB        2 g 200 mL/hr over 30 Minutes Intravenous Every 8 hours 04/20/21 1916     04/20/21 1945  metroNIDAZOLE (FLAGYL) IVPB 500 mg        500 mg 100 mL/hr over 60 Minutes Intravenous Every 12 hours 04/20/21 1857     04/19/21 2200  vancomycin (VANCOREADY) IVPB 1250 mg/250 mL        1,250 mg 166.7 mL/hr over 90 Minutes Intravenous Every 12 hours 04/19/21 0520     04/19/21 0500  piperacillin-tazobactam (ZOSYN) IVPB 3.375 g  Status:  Discontinued        3.375 g 12.5 mL/hr over 240 Minutes Intravenous Every 8 hours 04/19/21  7425 04/20/21 1857   04/19/21 0500  vancomycin (VANCOREADY) IVPB 2000 mg/400 mL        2,000 mg 200 mL/hr over 120 Minutes Intravenous  Once 04/19/21 0458 04/19/21 0800        Assessment/Plan POD 1 s/p EUA w/ internal drainage of left posterior perirectal abscess by Dr. Georgette Dover on 8/30  - Patient with suspected Crohn's disease w/ osteomyelitis of coccyx with perirectal abscess with fistulous tract to pericoccygeal abscess - Adjusted PO pain meds for better pain control.  - Appreciate coordinated care with ID and GI.  Plan for 2-3 weeks of antibiotics before starting Remicade (ID to recommend oral course).  Will arrange follow-up with colorectal surgeon in the office.  FEN - Reg VTE - SCDs, Lovenox  ID - Tressie Ellis, Flagyl, Vanc     LOS: 3 days    Jillyn Ledger , [facility] 04/22/2021, 8:35  AM Please see Amion for pager number during day hours 7:00am-4:30pm

## 2021-04-22 NOTE — Progress Notes (Signed)
[facility] Progress Note    Since last GI note: Rectal pain persists, maybe a bit worse than yesterday.  Objective: Vital signs in last 24 hours: Temp:  [97.1 F (36.2 C)-98.9 F (37.2 C)] 98.2 F (36.8 C) (08/31 0748) Pulse Rate:  [60-80] 70 (08/31 0748) Resp:  [10-18] 18 (08/31 0748) BP: (103-144)/(63-94) 109/63 (08/31 0748) SpO2:  [94 %-100 %] 98 % (08/31 0748) Weight:  [93 kg] 93 kg (08/30 0900) Last BM Date: 04/18/21 General: alert and oriented times 3 Heart: regular rate and rythm Abdomen: soft, non-tender, non-distended, normal bowel sounds  Lab Results: Recent Labs    04/19/21 1622 04/20/21 0114  WBC 12.7* 11.8*  HGB 11.8* 11.6*  PLT 348 324  MCV 94.1 94.4   Recent Labs    04/20/21 0114 04/21/21 0736 04/22/21 0029  NA 137 138 136  K 4.1 4.5 4.5  CL 111 109 110  CO2 21* 22 18*  GLUCOSE 91 111* 126*  BUN 17 16 21*  CREATININE 1.62* 1.55* 1.60*  CALCIUM 8.5* 8.7* 8.7*   Recent Labs    04/20/21 0114 04/22/21 0029  PROT 7.5  --   ALBUMIN 3.1* 2.9*  AST 17  --   ALT 25  --   ALKPHOS 90  --   BILITOT 0.8  --     Medications: Scheduled Meds:  enoxaparin (LOVENOX) injection  40 mg Subcutaneous Daily   Continuous Infusions:  cefTAZidime (FORTAZ)  IV 2 g (04/22/21 0529)   metronidazole 500 mg (04/21/21 1953)   vancomycin 1,250 mg (04/21/21 2128)   PRN Meds:.acetaminophen **OR** acetaminophen, HYDROmorphone (DILAUDID) injection, ondansetron **OR** ondansetron (ZOFRAN) IV, oxyCODONE, polyethylene glycol  Assessment/Plan: 35 y.o. male with isolated fistulizing anorectal Crohn's disease with perirectal, pelvic infection  S/o EUA yesterday, overall findings were pretty underwhelming for serious infection and ID raised uncertainty about whether he actually has osteomyelitis.  Plan for now is to continue Abx (ID to recommend oral course) until remicade start which we are trying to shoot for 2-3 weeks from now. We prefer overlap of oral  antibiotics for 1-2 weeks after remicade start.  He is going to be following up with Dr. Dema Severin at [facility] for wound healing. He is uncomfortable and will need a plan for oral pain meds at time of d/c.  Will follow along, anticipate d/c as soon as all of the above can be set up. [facility] (primary GI Dr. Fuller Plan) already working on our end.  Milus Banister, MD  04/22/2021, 8:07 AM [facility] Pager [phone]

## 2021-04-22 NOTE — Plan of Care (Signed)
  Problem: Education: Goal: Knowledge of General Education information will improve Description: Including pain rating scale, medication(s)/side effects and non-pharmacologic comfort measures Outcome: Adequate for Discharge   

## 2021-04-22 NOTE — Progress Notes (Signed)
  [facility]   Reason for visit: Follow up on osteomyelitis  Interval History: remains afebrile, no associated rash or diarrhea.     Physical Exam: Constitutional:  Vitals:   04/22/21 0526 04/22/21 0748  BP: (!) 144/94 109/63  Pulse: 68 70  Resp: 18 18  Temp: 98.1 F (36.7 C) 98.2 F (36.8 C)  SpO2: 100% 98%   patient appears in NAD Respiratory: Normal respiratory effort; CTA B Cardiovascular: RRR Skin: no rash  Review of Systems: Constitutional: negative for fevers and chills Gastrointestinal: negative for nausea and diarrhea  Lab Results  Component Value Date   WBC 11.8 (H) 04/20/2021   HGB 11.6 (L) 04/20/2021   HCT 33.6 (L) 04/20/2021   MCV 94.4 04/20/2021   PLT 324 04/20/2021    Lab Results  Component Value Date   CREATININE 1.60 (H) 04/22/2021   BUN 21 (H) 04/22/2021   NA 136 04/22/2021   K 4.5 04/22/2021   CL 110 04/22/2021   CO2 18 (L) 04/22/2021    Lab Results  Component Value Date   ALT 25 04/20/2021   AST 17 04/20/2021   ALKPHOS 90 04/20/2021     Microbiology: Recent Results (from the past 240 hour(s))  Culture, blood (Routine x 2)     Status: None (Preliminary result)   Collection Time: 04/19/21  4:16 AM   Specimen: BLOOD  Result Value Ref Range Status   Specimen Description BLOOD LEFT ANTECUBITAL  Final   Special Requests   Final    BOTTLES DRAWN AEROBIC AND ANAEROBIC Blood Culture adequate volume   Culture   Final    NO GROWTH 3 DAYS Performed at [facility], [address] [place], [place] [address]    Report Status PENDING  Incomplete  Culture, blood (Routine x 2)     Status: None (Preliminary result)   Collection Time: 04/19/21  5:10 AM   Specimen: BLOOD RIGHT ARM  Result Value Ref Range Status   Specimen Description BLOOD RIGHT ARM  Final   Special Requests   Final    BOTTLES DRAWN AEROBIC AND ANAEROBIC Blood Culture results may not be optimal due to an inadequate volume of blood received in  culture bottles   Culture   Final    NO GROWTH 3 DAYS Performed at [facility], [address] [place], [place] [address]    Report Status PENDING  Incomplete  Resp Panel by RT-PCR (Flu A&B, Covid) Nasopharyngeal Swab     Status: None   Collection Time: 04/19/21  5:12 AM   Specimen: Nasopharyngeal Swab; Nasopharyngeal(NP) swabs in vial transport medium  Result Value Ref Range Status   SARS Coronavirus 2 by RT PCR NEGATIVE NEGATIVE Final    Comment: (NOTE) SARS-CoV-2 target nucleic acids are NOT DETECTED.  The SARS-CoV-2 RNA is generally detectable in upper respiratory specimens during the acute phase of infection. The lowest concentration of SARS-CoV-2 viral copies this assay can detect is 138 copies/mL. A negative result does not preclude SARS-Cov-2 infection and should not be used as the sole basis for treatment or other patient management decisions. A negative result may occur with  improper specimen collection/handling, submission of specimen other than nasopharyngeal swab, presence of viral mutation(s) within the areas targeted by this assay, and inadequate number of viral copies(<138 copies/mL). A negative result must be combined with clinical observations, patient history, and epidemiological information. The expected result is Negative.  Fact Sheet for Patients:  https://www.fda.gov/media/152166/download    Fact Sheet for Healthcare Providers:  https://www.fda.gov/media/152162/download  This test is no t yet approved or cleared by the [place] FDA and  has been authorized for detection and/or diagnosis of SARS-CoV-2 by FDA under an Emergency Use Authorization (EUA). This EUA will remain  in effect (meaning this test can be used) for the duration of the COVID-19 declaration under Section 564(b)(1) of the Act, 21 U.S.C.section 360bbb-3(b)(1), unless the authorization is terminated  or revoked sooner.       Influenza A by PCR NEGATIVE NEGATIVE Final    Influenza B by PCR NEGATIVE NEGATIVE Final    Comment: (NOTE) The Xpert Xpress SARS-CoV-2/FLU/RSV plus assay is intended as an aid in the diagnosis of influenza from Nasopharyngeal swab specimens and should not be used as a sole basis for treatment. Nasal washings and aspirates are unacceptable for Xpert Xpress SARS-CoV-2/FLU/RSV testing.  Fact Sheet for Patients: https://www.fda.gov/media/152166/download  Fact Sheet for Healthcare Providers: https://www.fda.gov/media/152162/download  This test is not yet approved or cleared by the [place] FDA and has been authorized for detection and/or diagnosis of SARS-CoV-2 by FDA under an Emergency Use Authorization (EUA). This EUA will remain in effect (meaning this test can be used) for the duration of the COVID-19 declaration under Section 564(b)(1) of the Act, 21 U.S.C. section 360bbb-3(b)(1), unless the authorization is terminated or revoked.  Performed at [facility], [address] [place], [place] [address]   Surgical pcr screen     Status: None   Collection Time: 04/21/21  8:45 AM   Specimen: Nasal Mucosa; Nasal Swab  Result Value Ref Range Status   MRSA, PCR NEGATIVE NEGATIVE Final   Staphylococcus aureus NEGATIVE NEGATIVE Final    Comment: (NOTE) The Xpert SA Assay (FDA approved for NASAL specimens in patients 22 years of age and older), is one component of a comprehensive surveillance program. It is not intended to diagnose infection nor to guide or monitor treatment. Performed at [facility], [address] [place], [place] [address]     Impression/Plan:  1. Osteomyelitis - progressive findings and marrow edema but significant fluid around the area.  He already has had prolonged IV antibiotics for 6 weeks with ceftriaxone and metronidazole followed by prolonged oral antibiotics for 4 weeks with Augmentin.  MRI findings non-specific and with no surrounding pus.  Regardless, will continue with Augmentin  and will recheck the MRI in about 2 weeks to be sure no progression or other concerns.    2.  Fluid collection - not c/w infection and no cultures sent.  Will recheck as above with repeat MRI in 2-3 weeks  3.  Crohn's disease - need for remicade and complicated by previous infection and concerns as above.  I have discussed the case with GI and surgery and plan as above.  Treatment with Augmentin for 3 weeks with MRI at that time.  OK from my standpoint to extend the antibiotics while starting remicade.   The only issue may be the timeliness of a repeat MRI getting scheduled as an outpatient.    He has follow up with me 05/07/21 at 4pm I will recheck ESR, CRP and order MRI then.      

## 2021-04-22 NOTE — Telephone Encounter (Signed)
-----   Message from Ladene Artist, MD sent at 04/21/2021  1:16 PM EDT ----- Barbera Setters, Please start process to begin Remicade.  Judson Roch and Linna Hoff,  Defer to ID on antibiotic decisions so he will need ID follow up.  I am not comfortable providing pain mgmt so please be sure that pain mgmt with a non GI provider is in place before discharge.   MS   ----- Message ----- From: Milus Banister, MD Sent: 04/21/2021  12:41 PM EDT To: Marlon Pel, RN, Ladene Artist, MD, #  Mendel Corning, MRI suggested sacral osteomyelitis.  He underwent EUA today and there was no frank pus, just some fluid in perirectal tissues.  ID Dr. Linus Salmons is doubtful about serious underlying osteo or soft tissue infection and is planning about 2 weeks of oral Abx at this point.  I'm waiting to hear from surgeons about when they think todays EUS wound will be healed enough to start up biologics  I expect a decent plan to start working on is getting him ready for remicade new start in about 2 weeks from now.  I would keep him on oral abx until the remicade start and continue the abx for another two weeks.  He will probably be OK for d/c from [facility] tomorrow or Thursday.  Will be on Abx and pain meds (his sacrum is very sore).  Can you start working on new start remicade as above?  He has Tb and hep B results from 3 months ago that look good.   Thanks, let me know if you have any issues or questions with the plan above.  Carlis Stable

## 2021-04-22 NOTE — Telephone Encounter (Signed)
Orders placed for new start Remicade with [facility].

## 2021-04-22 NOTE — Progress Notes (Signed)
Physician Discharge Summary  Shawn [name] WUJ:[id number] DOB: 05/11/1986 DOA: 04/19/2021  PCP: Charlott Rakes, MD  Admit date: 04/19/2021 Discharge date: 04/22/2021  Time spent: 26 minutes  Recommendations for Outpatient Follow-up:  Complete Augmentin for 21 days and follow-up in the outpatient setting with ID Dr. 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 Johney Maine, CKD 2-3?  Hemorrhagic proteinaceous renal cysts on CT 05/2020, macrocytic anemia, HTN Prior admission 5/10-5/14/2022 due to extensive pelvic inflammation and suspected coccygeal osteo-ID surgery and GI consulted-patient needed 6 weeks of antibiotics-patient obtained PICC line and was to follow-up in the outpatient setting with GI and ID Underwent colonoscopy 01/09/2021 pathology = chronic colitis Developed recurrence of sacral pain-seen by GI outpatient setting 8/23 CT abdomen pelvis = circumferential thickening lower rectum inflammatory fat stranding contrast-enhancement suspicious for osteo--2.7 cm abscess around coccyx communicating with 1.9 cm perirectal abscess Found to have white count of 11 BUN/creatinine 17/1.5 Rx Zosyn morphine GI, ID consulted and eventually general surgery felt patient-underwent EUA 04/21/2021 with internal drainage of the left posterior perirectal abscess--abscess drainage did not seem to be purulent and no cultures were  obtained  Patient was seen ultimately by infectious disease as well as by general surgery and felt to be stable for discharge on 04/22/2021  ID Dr. Linus Salmons discussed the case with me on 8/31 and recommended that patient follow-up in the outpatient setting with them  Cultures were not obtained because only serous fluid apparently was drawn off of the examination under anesthesia therefore he feels comfortable prescribing 3 weeks of Augmentin and can be followed for osteomyelitis on further imaging in the outpatient setting  He was lost to follow-up in the past by GI and it is felt that he may probably have Crohn's disease and may benefit once he is treated fully from [place] will follow-up with Dr. Ardis Hughs and [facility]  Patient will also get the prescribed will be prescribed limited amounts of pain medication  He had met maximal hospital benefit on the day of discharge and was felt to be stable for discharge home   Discharge Exam: Vitals:   04/22/21 0526 04/22/21 0748  BP: (!) 144/94 109/63  Pulse: 68 70  Resp: 18 18  Temp: 98.1 F (36.7 C) 98.2 F (36.8 C)  SpO2: 100% 98%    Subj on day of d/c   Sleepy does not seem like he wants to talk  General Exam on discharge  EOMI NCAT no focal deficit Chest clear Declines further exam Understands the need for follow-up  Discharge Instructions   Discharge Instructions     Diet - low sodium heart healthy   Complete by: As directed    Discharge instructions   Complete by: As directed    Patient will be followed in the outpatient setting both general surgery and ID to follow-up the area that had been examined under anesthesia-this was not felt to be infectious on discharge--however patient will continue antibiotics which would cover both abscess and possible osteomyelitis  in the outpatient setting Patient does have high normal blood sugars and may benefit from diabetic screening in the outpatient setting with regards  to this He should keep all of his regular appointments Note that his lisinopril was stopped this admission  For pain he can take Tylenol as first choice ibuprofen sparingly as a second choice and for severe pain he can take oxycodone Oxy IR Limited prescription given for expected postop pain   Discharge wound care:   Complete by: As directed    As per general surgery   Increase activity slowly   Complete by: As directed       Allergies as of 04/22/2021       Reactions   Shrimp [shellfish Allergy] Shortness Of Breath        Medication List     STOP taking these medications    cyanocobalamin 1000 MCG tablet   lisinopril 10 MG tablet Commonly known as: ZESTRIL       TAKE these medications    acetaminophen 500 MG tablet Commonly known as: TYLENOL Take 2 tablets (1,000 mg total) by mouth every 6 (six) hours.   amoxicillin-clavulanate 875-125 MG tablet Commonly known as: AUGMENTIN Take 1 tablet by mouth every 12 (twelve) hours for 42 doses.   IBUPROFEN PO Take 3 tablets by mouth daily as needed (pain).   methocarbamol 750 MG tablet Commonly known as: ROBAXIN Take 1 tablet (750 mg total) by mouth every 6 (six) hours as needed for muscle spasms.   oxyCODONE 5 MG immediate release tablet Commonly known as: Oxy IR/ROXICODONE Take 1-2 tablets (5-10 mg total) by mouth every 4 (four) hours as needed for moderate pain.               Discharge Care Instructions  (From admission, onward)           Start     Ordered   04/22/21 0000  Discharge wound care:       Comments: As per general surgery   04/22/21 0954           Allergies  Allergen Reactions   Shrimp [Shellfish Allergy] Shortness Of Breath      The results of significant diagnostics from this hospitalization (including imaging, microbiology, ancillary and laboratory) are listed below for reference.    Significant Diagnostic Studies: CT PELVIS W WO CONTRAST  Result Date:  04/15/2021 CLINICAL DATA:  History of rectal abscess, recent hospitalization and IV antibiotic treatment, recurrent low rectal pain EXAM: CT PELVIS WITH AND WITHOUT CONTRAST TECHNIQUE: Multidetector CT imaging of the pelvis was performed following the standard protocol before and following the bolus administration of intravenous contrast. CONTRAST:  113m OMNIPAQUE IOHEXOL 300 MG/ML  SOLN COMPARISON:  CT pelvis, 12/20/2020, MR pelvis, 01/02/2021 FINDINGS: Urinary Tract:  No abnormality visualized. Bowel: Redemonstrated circumferential thickening of the low rectum (series 2, image 37). At the left aspect of the low rectum, above the levator musculature, there is a probable small air containing abscess cavity measuring 1.3 x 0.7 cm, better detailed by prior MR and previously fluid-filled, measuring approximately 3.0 x 0.9 cm at that time (series 6, image 36, series 8, image 52). Numerous prominent subcentimeter perirectal lymph nodes, unchanged (series 6, image 25). Inflammatory fat stranding and contrast enhancement again extends into the posterior soft tissues and appears to involve the tip of the coccyx (series 6, image 39). Vascular/Lymphatic: No pathologically enlarged lymph nodes. No significant vascular abnormality seen. Reproductive:  No mass or other significant abnormality Other:  None. Musculoskeletal: No suspicious bone lesions identified. IMPRESSION: 1. Redemonstrated circumferential thickening of the low rectum. At the left aspect of the low rectum, above the levator musculature, there is a probable small air containing abscess cavity measuring 1.3 x 0.7 cm, better detailed by prior MR and previously fluid-filled, measuring approximately 3.0 x 0.9 cm at that time. Findings are consistent with persistent, although improved abscess. 2. Inflammatory fat stranding and contrast enhancement again extends into the posterior soft tissues and appears to involve the tip of the coccyx. As on prior examination, this  is suspicious for osteomyelitis. 3. 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 Consider repeat contrast enhanced MR of the pelvis to further evaluate. 4. Numerous prominent subcentimeter perirectal lymph nodes, unchanged and likely reactive. Electronically Signed   By: Eddie Candle M.D.   On: 04/15/2021 15:13   MR PELVIS W WO CONTRAST  Result Date: 04/19/2021 CLINICAL DATA:  Sacral pain. History of perirectal abscess and sacral osteomyelitis. EXAM: MRI PELVIS WITHOUT AND WITH CONTRAST TECHNIQUE: Multiplanar multisequence MR imaging of the pelvis was performed both before and after administration of intravenous contrast. CONTRAST:  9.51m GADAVIST GADOBUTROL 1 MMOL/ML IV SOLN COMPARISON:  CT pelvis dated April 15, 2021. MR abdomen and pelvis dated Jan 02, 2021. FINDINGS: Bones/Joint/Cartilage Abnormal marrow edema and enhancement of the lower coccyx with prominent surrounding inflammatory changes. No fracture or dislocation. Joint spaces are preserved. No joint effusion. Muscles and Tendons Intact.  No muscle edema or atrophy. Soft tissue Rectal wall thickening and perirectal inflammatory changes with unchanged 1.9 x 0.7 cm gas and air-fluid collection along the left posterolateral aspect of the rectum (4 o'clock position) above the levator musculature (series 13, image 35). Again seen is a small sinus tract from this fluid collection extending to a larger fluid collection surrounding the coccyx measuring approximately 1.5 x 2.0 x 2.7 cm (series 13, image 39). Perirectal inflammatory changes have overall decreased compared to the MRI in April, but pericoccygeal inflammatory changes have increased. Prominent subcentimeter perirectal lymph nodes are similar to prior MRI. IMPRESSION: 1. Progressive osteomyelitis involving the lower coccyx with 2.7 cm abscess  surrounding the coccyx that communicates with a small 1.9 cm perirectal abscess through a small sinus tract. 2. Pericoccygeal inflammatory changes have worsened since April. Perirectal inflammatory changes have improved since April. Electronically Signed   By: WTitus DubinM.D.   On: 04/19/2021 11:48   DG Chest Port 1 View  Result Date: 04/19/2021 CLINICAL DATA:  35year old male with pain. EXAM: PORTABLE CHEST 1 VIEW COMPARISON:  Portable chest 10/25/2019 and earlier. FINDINGS: Portable AP semi upright views at 0436 hours. Lung volumes and mediastinal contours are within normal limits. Visualized tracheal air column is within normal limits. Allowing for portable technique the lungs are clear. No pneumothorax. No pleural effusion is evident. No osseous abnormality identified. IMPRESSION: Negative portable chest. Electronically Signed   By: HGenevie AnnM.D.   On: 04/19/2021 04:57    Microbiology: Recent Results (from the past 240 hour(s))  Culture, blood (Routine x 2)     Status: None (Preliminary result)   Collection Time: 04/19/21  4:16 AM   Specimen: BLOOD  Result Value Ref Range Status   Specimen Description BLOOD LEFT ANTECUBITAL  Final   Special Requests   Final    BOTTLES DRAWN AEROBIC AND ANAEROBIC Blood Culture adequate volume   Culture  Final    NO GROWTH 2 DAYS Performed at [facility], [place] [address], [place], [place] [address]    Report Status PENDING  Incomplete  Culture, blood (Routine x 2)     Status: None (Preliminary result)   Collection Time: 04/19/21  5:10 AM   Specimen: BLOOD RIGHT ARM  Result Value Ref Range Status   Specimen Description BLOOD RIGHT ARM  Final   Special Requests   Final    BOTTLES DRAWN AEROBIC AND ANAEROBIC Blood Culture results may not be optimal due to an inadequate volume of blood received in culture bottles   Culture   Final    NO GROWTH 2 DAYS Performed at [facility], [place] [address] [place], [place] [address]    Report Status  PENDING  Incomplete  Resp Panel by RT-PCR (Flu A&B, Covid) Nasopharyngeal Swab     Status: None   Collection Time: 04/19/21  5:12 AM   Specimen: Nasopharyngeal Swab; Nasopharyngeal(NP) swabs in vial transport medium  Result Value Ref Range Status   SARS Coronavirus 2 by RT PCR NEGATIVE NEGATIVE Final    Comment: (NOTE) SARS-CoV-2 target nucleic acids are NOT DETECTED.  The SARS-CoV-2 RNA is generally detectable in upper respiratory specimens during the acute phase of infection. The lowest concentration of SARS-CoV-2 viral copies this assay can detect is 138 copies/mL. A negative result does not preclude SARS-Cov-2 infection and should not be used as the sole basis for treatment or other patient management decisions. A negative result may occur with  improper specimen collection/handling, submission of specimen other than nasopharyngeal swab, presence of viral mutation(s) within the areas targeted by this assay, and inadequate number of viral copies(<138 copies/mL). A negative result must be combined with clinical observations, patient history, and epidemiological information. The expected result is Negative.  Fact Sheet for Patients:  EntrepreneurPulse.com.au  Fact Sheet for Healthcare Providers:  IncredibleEmployment.be  This test is no t yet approved or cleared by the [place] FDA and  has been authorized for detection and/or diagnosis of SARS-CoV-2 by FDA under an Emergency Use Authorization (EUA). This EUA will remain  in effect (meaning this test can be used) for the duration of the COVID-19 declaration under Section 564(b)(1) of the Act, 21 U.S.C.section 360bbb-3(b)(1), unless the authorization is terminated  or revoked sooner.       Influenza A by PCR NEGATIVE NEGATIVE Final   Influenza B by PCR NEGATIVE NEGATIVE Final    Comment: (NOTE) The Xpert Xpress SARS-CoV-2/FLU/RSV plus assay is intended as an aid in the diagnosis of  influenza from Nasopharyngeal swab specimens and should not be used as a sole basis for treatment. Nasal washings and aspirates are unacceptable for Xpert Xpress SARS-CoV-2/FLU/RSV testing.  Fact Sheet for Patients: EntrepreneurPulse.com.au  Fact Sheet for Healthcare Providers: IncredibleEmployment.be  This test is not yet approved or cleared by the [place] FDA and has been authorized for detection and/or diagnosis of SARS-CoV-2 by FDA under an Emergency Use Authorization (EUA). This EUA will remain in effect (meaning this test can be used) for the duration of the COVID-19 declaration under Section 564(b)(1) of the Act, 21 U.S.C. section 360bbb-3(b)(1), unless the authorization is terminated or revoked.  Performed at [facility], [place] [address], [place], [place] [address]   Surgical pcr screen     Status: None   Collection Time: 04/21/21  8:45 AM   Specimen: Nasal Mucosa; Nasal Swab  Result Value Ref Range Status   MRSA,  PCR NEGATIVE NEGATIVE Final   Staphylococcus aureus NEGATIVE NEGATIVE Final    Comment: (NOTE) The Xpert SA Assay (FDA approved for NASAL specimens in patients 17 years of age and older), is one component of a comprehensive surveillance program. It is not intended to diagnose infection nor to guide or monitor treatment. Performed at [facility], [place] [address], [place], [place] [address]      Labs: Basic Metabolic Panel: Recent Labs  Lab 04/19/21 0408 04/19/21 1622 04/20/21 0114 04/21/21 0736 04/22/21 0029  NA 137  --  137 138 136  K 4.1  --  4.1 4.5 4.5  CL 108  --  111 109 110  CO2 16*  --  21* 22 18*  GLUCOSE 107*  --  91 111* 126*  BUN 17  --  17 16 21*  CREATININE 1.58* 1.62* 1.62* 1.55* 1.60*  CALCIUM 9.1  --  8.5* 8.7* 8.7*  MG  --   --  2.1  --   --   PHOS  --   --  3.8  --  2.8   Liver Function Tests: Recent Labs  Lab 04/19/21 0408 04/20/21 0114 04/22/21 0029  AST 22 17  --    ALT 31 25  --   ALKPHOS 93 90  --   BILITOT 0.7 0.8  --   PROT 8.0 7.5  --   ALBUMIN 3.5 3.1* 2.9*   No results for input(s): LIPASE, AMYLASE in the last 168 hours. No results for input(s): AMMONIA in the last 168 hours. CBC: Recent Labs  Lab 04/19/21 0408 04/19/21 1622 04/20/21 0114  WBC 11.5* 12.7* 11.8*  NEUTROABS 6.9  --   --   HGB 12.5* 11.8* 11.6*  HCT 36.7* 35.0* 33.6*  MCV 94.1 94.1 94.4  PLT 357 348 324   Cardiac Enzymes: No results for input(s): CKTOTAL, CKMB, CKMBINDEX, TROPONINI in the last 168 hours. BNP: BNP (last 3 results) No results for input(s): BNP in the last 8760 hours.  ProBNP (last 3 results) No results for input(s): PROBNP in the last 8760 hours.  CBG: No results for input(s): GLUCAP in the last 168 hours.     Signed:  Nita Sells MD   [facility] 04/22/2021, 9:54 AM

## 2021-04-23 ENCOUNTER — Telehealth: Payer: Self-pay

## 2021-04-23 NOTE — Telephone Encounter (Signed)
From the discharge call:   He said he is still in pain but getting around    He said that he has the augmentin and robaxin but not the tylenol or oxycodone.  He said that his pharmacy would not fill the oxycodone because the prescription was not signed. Instructed him to contact the floor he was discharged from and inform them of the issue and he  said he was getting ready  to call them.   He said he will call to schedule an appointment with Dr Margarita Rana. He is scheduled to see [facility] 05/07/2021.

## 2021-04-23 NOTE — Telephone Encounter (Signed)
Transition Care Management Follow-up Telephone Call Date of discharge and from where: 04/22/2021, [facility]  How have you been since you were released from the hospital? He said he is still in pain but getting around  Any questions or concerns? Yes - noted below regarding oxycodone.   Items Reviewed: Did the pt receive and understand the discharge instructions provided? Yes  Medications obtained and verified?  He said that he has the augmentin and robaxin but not the tylenol or oxycodone.  He said that his pharmacy would not fill the oxycodone because the prescription was not signed. Instructed him to contact the floor he was discharged from and inform them of the issue and he  said he was getting ready  to call them . Other? No  Any new allergies since your discharge? No  Dietary orders reviewed? No Do you have support at home? Yes   Home Care and Equipment/Supplies: Were home health services ordered? no If so, what is the name of the agency? N/a  Has the agency set up a time to come to the patient's home? not applicable Were any new equipment or medical supplies ordered?  No What is the name of the medical supply agency? N/a Were you able to get the supplies/equipment? not applicable Do you have any questions related to the use of the equipment or supplies? No  Functional Questionnaire: (I = Independent and D = Dependent) ADLs: independent   Follow up appointments reviewed:  PCP Hospital f/u appt confirmed?  He said he will call to schedule an appointment    Specialist Hospital f/u appt confirmed? Yes  Scheduled to see [facility] on 05/07/2021.  Are transportation arrangements needed? No  If their condition worsens, is the pt aware to call PCP or go to the Emergency Dept.? Yes Was the patient provided with contact information for the PCP's office or ED? Yes Was to pt encouraged to call back with questions or concerns? Yes

## 2021-04-24 LAB — CULTURE, BLOOD (ROUTINE X 2)
Culture: NO GROWTH
Culture: NO GROWTH
Special Requests: ADEQUATE

## 2021-04-29 ENCOUNTER — Other Ambulatory Visit: Payer: Self-pay | Admitting: Pharmacy Technician

## 2021-04-29 ENCOUNTER — Encounter: Payer: Self-pay | Admitting: Gastroenterology

## 2021-04-29 DIAGNOSIS — K5000 Crohn's disease of small intestine without complications: Secondary | ICD-10-CM | POA: Insufficient documentation

## 2021-04-29 NOTE — Telephone Encounter (Signed)
Auth Submission: PENDING - URGENT REQUEST Payer: [employer]IDAY Medication & CPT/J Code(s) submitted: Remicade (Infliximab) J1745 Route of submission (phone, fax, portal): FAX [phone] PHONE: [phone] Auth type: Buy/Bill Units/visits requested: 9    Will update once we receive a response.

## 2021-04-29 NOTE — Telephone Encounter (Signed)
Can you please tell me the status of his referral for Remicade?

## 2021-04-29 NOTE — Telephone Encounter (Signed)
Please hold on ordering Remicade until he has had ID follow up and the follow up MRI has been completed. We need to know his infection is under control and if Remicade is delayed because of that it is ok.

## 2021-04-29 NOTE — Telephone Encounter (Signed)
Dr. Fuller Plan, infusion center still working on the prior auth.  Okay to begin Remicade once they obtain auth?  I believe he was to have a MRI after 3 weeks of antibiotics. I do not see a MRI scheduled at this point.  Please advise timing of Remicade initiation if insurance approves.

## 2021-04-29 NOTE — Telephone Encounter (Signed)
Please see notes from Dr. Fuller Plan.  Patient has follow up with ID on 9/15. We will send update once ID and MRI have completed. Hopefully will get an answer from the insurance soon and know if Remicade is covered. Thank you for your help.

## 2021-04-30 NOTE — Telephone Encounter (Addendum)
Dr. Fuller Plan,  [facility])  Remicade has been approved through his insurance. We will hold off on ordering and starting treatment until all follow-ups are completed  Thanks Kim.  Auth Submission: approved Payer: [employer]AY Medication & CPT/J Code(s) submitted: Remicade (Infliximab) [id number] Route of submission (phone, fax, portal): FAX [phone] PHONE: [phone] Auth type: Buy/Bill Units/visits requested: 9 Approval: 04/30/21 - 08/23/22 Reference number: [id number]

## 2021-05-01 ENCOUNTER — Encounter: Payer: Self-pay | Admitting: Gastroenterology

## 2021-05-07 ENCOUNTER — Ambulatory Visit (INDEPENDENT_AMBULATORY_CARE_PROVIDER_SITE_OTHER): Payer: 59 | Admitting: Internal Medicine

## 2021-05-07 ENCOUNTER — Encounter: Payer: Self-pay | Admitting: Internal Medicine

## 2021-05-07 ENCOUNTER — Other Ambulatory Visit: Payer: Self-pay

## 2021-05-07 VITALS — BP 142/81 | HR 97 | Temp 98.4°F | Wt 205.0 lb

## 2021-05-07 DIAGNOSIS — M869 Osteomyelitis, unspecified: Secondary | ICD-10-CM

## 2021-05-07 DIAGNOSIS — K50019 Crohn's disease of small intestine with unspecified complications: Secondary | ICD-10-CM

## 2021-05-07 DIAGNOSIS — Z720 Tobacco use: Secondary | ICD-10-CM | POA: Diagnosis not present

## 2021-05-08 ENCOUNTER — Emergency Department (HOSPITAL_COMMUNITY): Admission: EM | Admit: 2021-05-08 | Discharge: 2021-05-08 | Discharge status: Against Medical Advice | Payer: 59

## 2021-05-08 ENCOUNTER — Encounter: Payer: Self-pay | Admitting: Internal Medicine

## 2021-05-08 LAB — SEDIMENTATION RATE: Sed Rate: 50 mm/h — ABNORMAL HIGH (ref 0–15)

## 2021-05-08 LAB — C-REACTIVE PROTEIN: CRP: 44.4 mg/L — ABNORMAL HIGH (ref <8.0)

## 2021-05-08 NOTE — Assessment & Plan Note (Signed)
Unclear if residual findings or active infection.  At this time, will check inflammatory markers (noted to be stable compared to previous) and repeat the MRI.  If the MRI is stable, ok from ID standpoint to proceed with remicade, as indicated.

## 2021-05-08 NOTE — Assessment & Plan Note (Signed)
Advised to quit

## 2021-05-08 NOTE — Progress Notes (Signed)
   Subjective:    Patient ID: Shawn [name], male    DOB: 1986/04/24, 35 y.o.   MRN: [id number]  HPI He is here for hospital follow up. 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  MRI in the hospital concerns for progression of osteomyelitis.  He was previously treated for this with 6 weeks of IV ceftriaxone and oral metronidazole and then transition to amoxicillin/clavulanate for a month. He also has a recent history of Crohn's disease and consideration of remicaid infusion but he states he is unaware of this.  I had him continue with oral amoxicillin/clavulanate at discharge.  I discussed with GI and surgery that I am not convinced this is new acute osteomyelitis after his previous treatment as you would expect to see some progression until it is treated so decided to repeat an MRI after discharge to see if there is any difference.     Review of Systems  Constitutional:  Negative for chills and fever.  Gastrointestinal:  Negative for blood in stool.  Musculoskeletal:        He continues to have pain in his sacral area  Skin:  Negative for rash.      Objective:   Physical Exam Eyes:     General: No scleral icterus. Pulmonary:     Effort: Pulmonary effort is normal.  Neurological:     General: No focal deficit present.     Mental Status: He is alert.  Psychiatric:        Mood and Affect: Mood normal.   SH: + tobacco       Assessment & Plan:

## 2021-05-08 NOTE — Assessment & Plan Note (Signed)
As noted, will check the MRI.  He has follow up with GI and to consider treatment based on MRI results vs retreatment for osteomyelitis.  He will follow up with me after the results.

## 2021-05-16 ENCOUNTER — Ambulatory Visit (HOSPITAL_COMMUNITY)
Admission: RE | Admit: 2021-05-16 | Discharge: 2021-05-16 | Disposition: A | Discharge status: Home / Self Care | Payer: 59 | Source: Ambulatory Visit | Attending: Internal Medicine | Admitting: Internal Medicine

## 2021-05-16 DIAGNOSIS — M869 Osteomyelitis, unspecified: Secondary | ICD-10-CM | POA: Diagnosis present

## 2021-05-16 IMAGING — MR MR PELVIS WO/W CM
8 series · 48 of 48 positions shown · IV contrast (gadavist)
Comparison: [DATE]

CLINICAL DATA: Prior [facility] anal abscess surgery, sacrococcygeal
pain, query osteomyelitis.

EXAM:
MRI PELVIS WITHOUT AND WITH CONTRAST
TECHNIQUE: Multiplanar multisequence MR imaging of the pelvis was performed
both before and after administration of intravenous contrast.
CONTRAST:  9mL GADAVIST GADOBUTROL 1 MMOL/ML IV SOLN

[Series 6: STIR · coronal · 5.0mm · 1.19mm/px · 5 of 40 slices shown (1 of 2)]
[im 1/40]
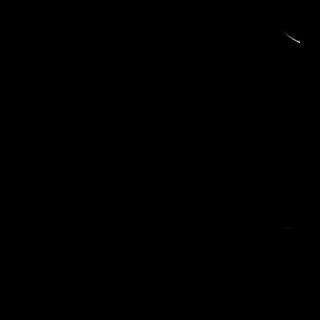
[im 10/40]
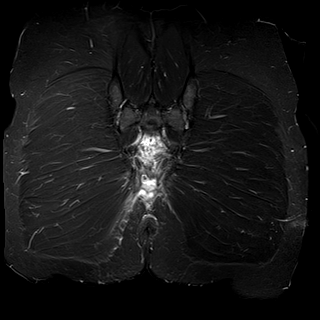
[im 20/40]
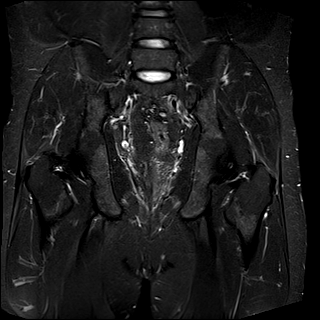
[im 30/40]
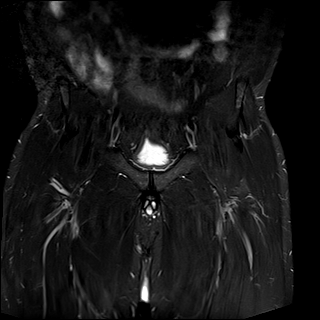
[im 40/40]
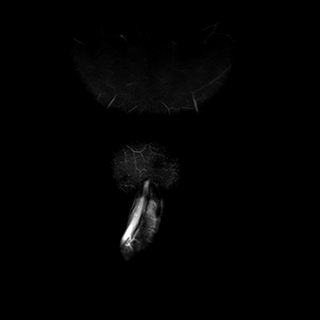

[Series 7: T1 · coronal · 5.0mm · 1.11mm/px · 5 of 39 slices shown (1 of 2)]
[im 1/39]
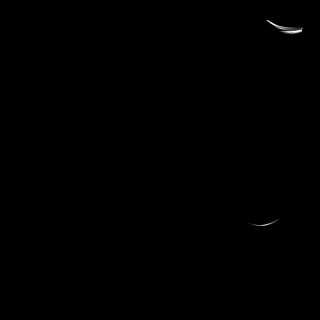
[im 10/39]
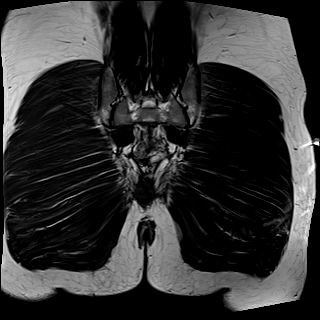
[im 20/39]
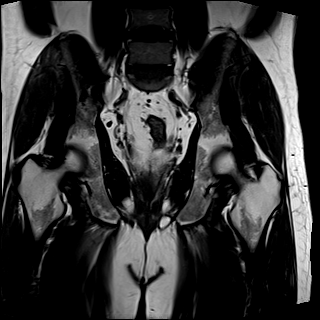
[im 29/39]
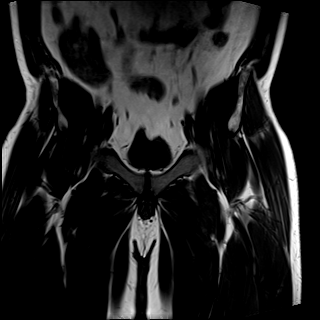
[im 39/39]
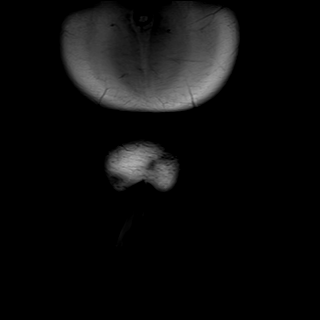

[Series 10: T1 · axial · 4.0mm · 1.19mm/px · z∈[-177,+128]mm · 7 of 62 slices shown (2 of 2)]
[im 1/62]
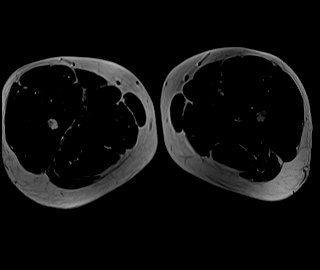
[im 11/62]
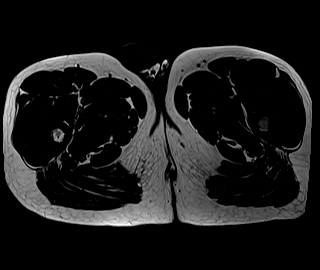
[im 21/62]
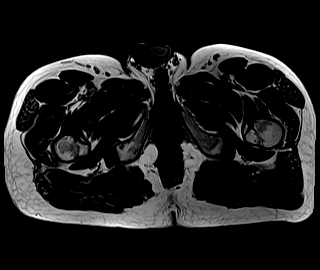
[im 31/62]
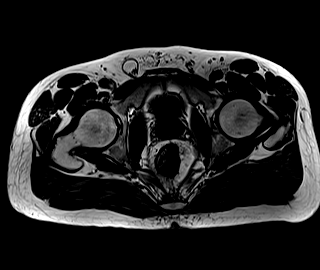
[im 41/62]
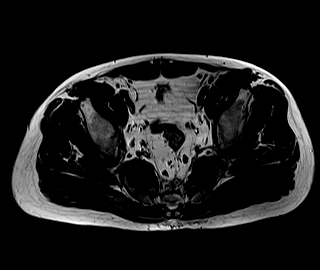
[im 51/62]
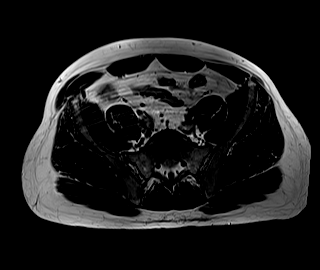
[im 62/62]
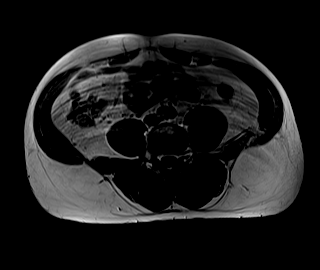

[Series 11: STIR · axial · 4.0mm · 1.19mm/px · z∈[-177,+128]mm · 7 of 62 slices shown (2 of 2)]
[im 1/62]
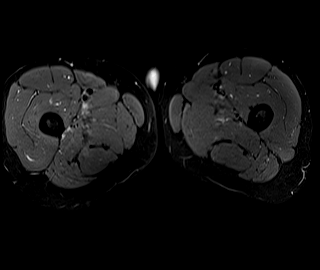
[im 11/62]
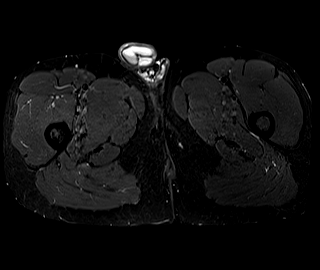
[im 21/62]
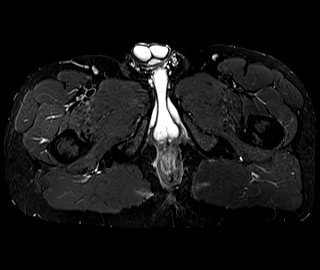
[im 31/62]
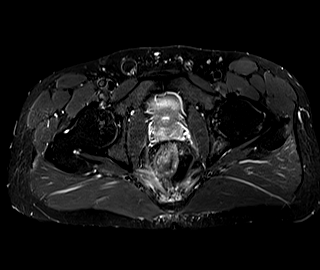
[im 41/62]
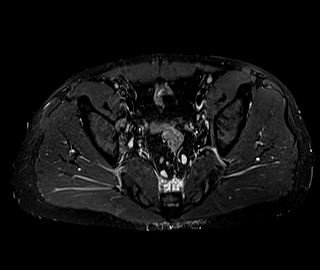
[im 51/62]
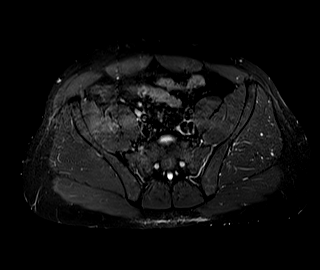
[im 62/62]
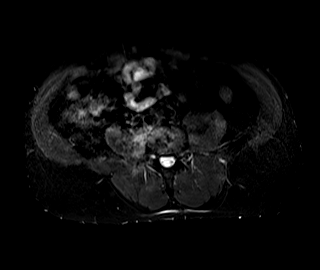

[Series 12: T2 · sagittal · 6.0mm · 1.06mm/px · 5 of 46 slices shown]
[im 1/46]
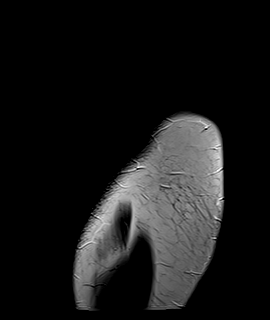
[im 12/46]
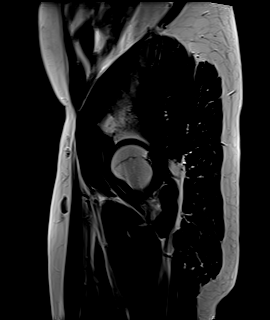
[im 23/46]
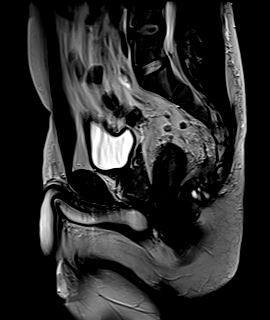
[im 34/46]
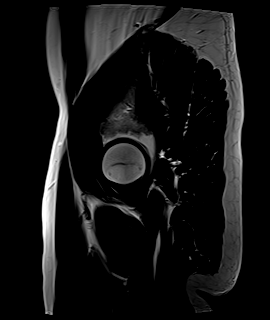
[im 46/46]
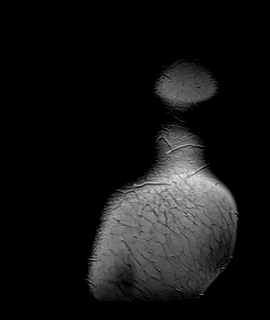

[Series 13: T1 fat-sat · axial · non-contrast · 4.0mm · 1.19mm/px · z∈[-177,+128]mm · 7 of 62 slices shown]
[im 1/62]
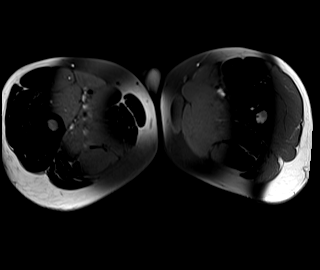
[im 11/62]
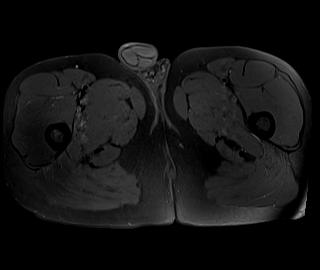
[im 21/62]
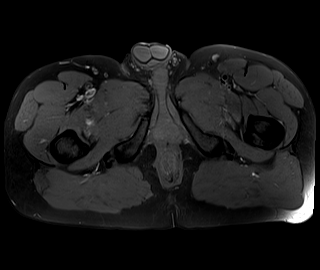
[im 31/62]
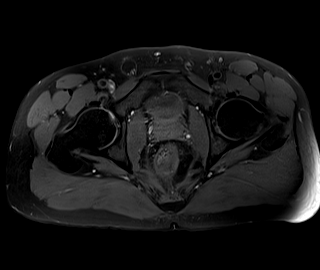
[im 41/62]
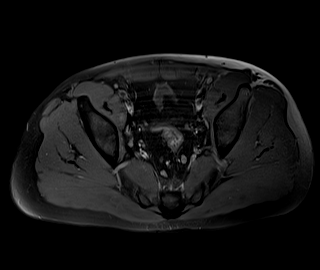
[im 51/62]
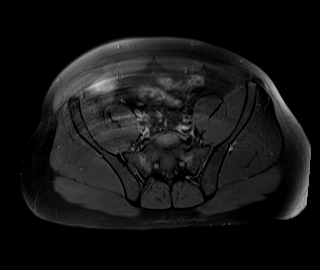
[im 62/62]
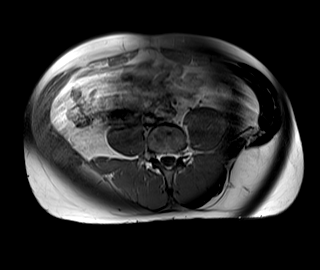

[Series 15: T1 fat-sat post-contrast · coronal · 5.0mm · 1.19mm/px · 5 of 42 slices shown (1 of 2)]
[im 1/42]
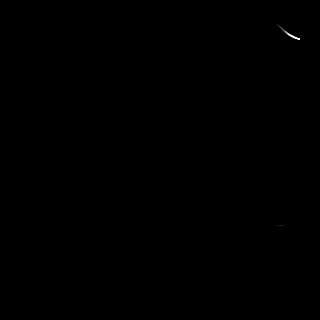
[im 11/42]
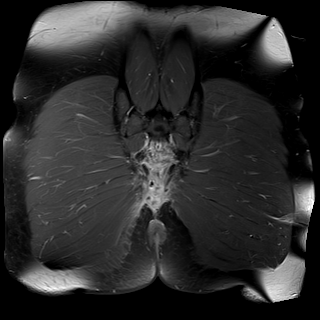
[im 21/42]
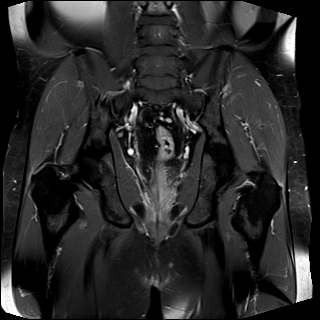
[im 31/42]
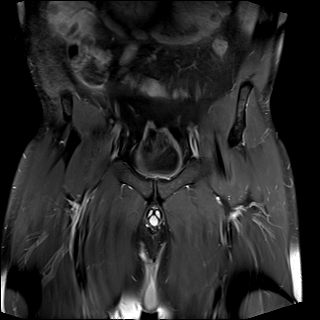
[im 42/42]
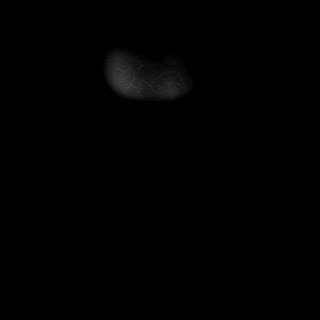

[Series 16: T1 fat-sat post-contrast · axial · 4.0mm · 1.19mm/px · z∈[-177,+128]mm · 7 of 62 slices shown (2 of 2)]
[im 1/62]
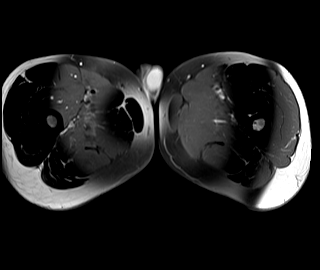
[im 11/62]
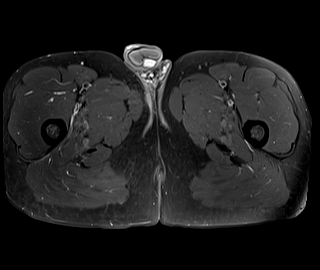
[im 21/62]
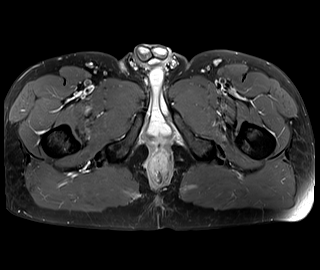
[im 31/62]
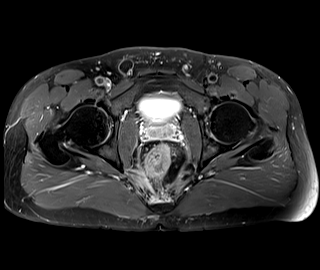
[im 41/62]
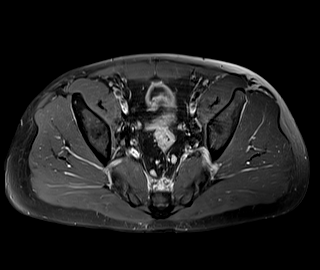
[im 51/62]
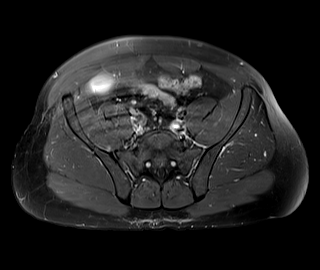
[im 62/62]
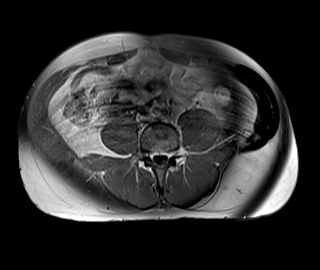

[48 of 48 positions shown; findings below may reference images not displayed]

FINDINGS: Bones:Bony destructive findings of the coccyx below the first
coccygeal segment as shown on image 24 series 12, with the space of
the lower coccyx and surrounding area occupied by a 3.1 by 2.3 by
2.1 cm region of diffuse enhancement which has bandlike extensions
along the ischiorectal fossa, pelvic floor, and posterior
perirectal/perianal space as well as the presacral space as shown on
images 25 through 41 of series 16. On images 33 through 38 of series
11, and also on analogous post-contrast series, there appears to be
a fistula extending from the left lower rectum at the 5 o'clock
position and extending posteriorly towards this process. The fistula
appears to contain a tiny locule of gas for example on image 35
series 16. There also small locules of gas in the presacral region
on image 34 series 13.

Joint or bursal effusion

Regional joint effusion: No SI joint or hip effusion.

Bursae: No regional bursitis.

Muscles and tendons

Trace edema medially in the gluteus maximus muscles adjacent to the
coccyx for example on image 41 series 11, likely reactive.

Other findings

Scattered likely reactive perirectal lymph nodes.
IMPRESSION: 1. Although today's exam was not performed using the perianal
fistula protocol, there does appear to be a fistula extending about
from the level of the anorectal junction at the 5 o'clock position,
tracking posteriorly to the destructive coccygeal process which
demonstrates enhancing tissue and some minimal tubular nonenhancing
components which may reflect residual microabscesses. Currently the
uppermost coccygeal segment remains intact but the coccyx below this
level is eroded/destroyed. Scattered reactive perirectal lymph nodes
are present.

## 2021-05-16 MED ORDER — GADOBUTROL 1 MMOL/ML IV SOLN
9.0000 mL | Freq: Once | INTRAVENOUS | Status: AC | PRN
  Administered 2021-05-16: 9 mL via INTRAVENOUS

## 2021-05-19 NOTE — Progress Notes (Signed)
I agree with holding on Remicade until infection is resolved or at least improving and under good control. Thanks.

## 2021-05-20 ENCOUNTER — Other Ambulatory Visit: Payer: Self-pay | Admitting: Internal Medicine

## 2021-05-20 DIAGNOSIS — M869 Osteomyelitis, unspecified: Secondary | ICD-10-CM

## 2021-05-25 ENCOUNTER — Telehealth: Payer: Self-pay

## 2021-05-25 ENCOUNTER — Other Ambulatory Visit: Payer: Self-pay | Admitting: Internal Medicine

## 2021-05-25 MED ORDER — AMOXICILLIN 875 MG PO TABS: 875.0000 mg | ORAL_TABLET | Freq: Two times a day (BID) | ORAL | 0 refills | Status: DC

## 2021-05-25 NOTE — Telephone Encounter (Signed)
Patient aware RX for Augmentin sent to pharmacy and to start taking abx until appointments with colorectal surgeon and Dr.Comer next week.     [place], CMA

## 2021-05-25 NOTE — Telephone Encounter (Signed)
-----   Message from Thayer Headings, MD sent at 05/25/2021  3:37 PM EDT ----- Can you let him know, after talking to the surgery team regarding his MRI, I would like him to restart the Augmentin until his appt with the colorectal surgeon next week and his appt with me next week.   I have sent it in.  thanks

## 2021-05-27 ENCOUNTER — Other Ambulatory Visit: Payer: Self-pay

## 2021-05-27 ENCOUNTER — Ambulatory Visit: Payer: 59 | Attending: Family Medicine | Admitting: Family Medicine

## 2021-05-27 ENCOUNTER — Encounter: Payer: Self-pay | Admitting: Family Medicine

## 2021-05-27 VITALS — BP 135/94 | HR 93 | Ht 73.0 in | Wt 204.0 lb

## 2021-05-27 DIAGNOSIS — K50019 Crohn's disease of small intestine with unspecified complications: Secondary | ICD-10-CM | POA: Diagnosis not present

## 2021-05-27 DIAGNOSIS — G8929 Other chronic pain: Secondary | ICD-10-CM

## 2021-05-27 DIAGNOSIS — M4628 Osteomyelitis of vertebra, sacral and sacrococcygeal region: Secondary | ICD-10-CM

## 2021-05-27 DIAGNOSIS — K5909 Other constipation: Secondary | ICD-10-CM | POA: Diagnosis not present

## 2021-05-27 MED ORDER — POLYETHYLENE GLYCOL 3350 17 GM/SCOOP PO POWD: 17.0000 g | Freq: Every day | ORAL | 1 refills | Status: DC

## 2021-05-27 MED ORDER — METHOCARBAMOL 750 MG PO TABS: 750.0000 mg | ORAL_TABLET | Freq: Three times a day (TID) | ORAL | 0 refills | Status: DC | PRN

## 2021-05-27 MED ORDER — DULOXETINE HCL 60 MG PO CPEP: 60.0000 mg | ORAL_CAPSULE | Freq: Every day | ORAL | 3 refills | Status: DC

## 2021-05-27 NOTE — Progress Notes (Signed)
Subjective:  Patient ID: Shawn [name], male    DOB: December 11, 1985  Age: 35 y.o. MRN: [id number]  CC: Hospitalization Follow-up   HPI Shawn [name] is a 35 y.o. year old male with a history of  hypertension, dilated cardiomyopathy (diagnosed in 2014, last echo in 11/2020 showed a LVEF of 65-70% with normal left ventricle function and no other structural abnormalities),alcohol abuse, disease with perirectal abscess (status post EUA and drainage of left posterior perirectal abscess), Crohn's disease, osteomyelitis of the Coccyx.  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  Today he complains of pain in his lower back/tailbone  abdominal pain with constipation. His constipation has been present for the last month. He used stool softners from [employer] also no avail.  He has questions because he thinks he has IBS but I see no record of IBS in his chart. His chart does reveal he received an IV infusion of Remicade on 04/29/2021 however the patient denies this.  [facility] is followed [facility]. He has dull, throbbing pain in his tail bone and received Oxycodone from the ED which he ran out of. Infectious disease advised him to use Tylenol or Ibuprofen which have been ineffective.  He is currently on amoxicillin for his osteomyelitis and is followed by Dr. Linus Salmons. Pain is excruciating and affects him at work. 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 ENDOSCOPY;  Service: Endoscopy;;   COLONOSCOPY WITH PROPOFOL N/A 01/01/2021   Procedure:  COLONOSCOPY WITH PROPOFOL;  Surgeon: Ladene Artist, MD;  Location: [facility] ENDOSCOPY;  Service: Endoscopy;  Laterality: N/A;   LEFT AND RIGHT HEART CATHETERIZATION WITH CORONARY ANGIOGRAM N/A 11/21/2012   Procedure: LEFT AND RIGHT HEART CATHETERIZATION WITH CORONARY ANGIOGRAM;  Surgeon: Birdie Riddle, MD;  Location: [facility] CATH LAB;  Service: Cardiovascular;  Laterality: N/A;   MANDIBLE FRACTURE SURGERY  2006    Family History  Problem Relation Age of Onset   Diabetes Mother    Hypertension Other     Allergies  Allergen Reactions   Shrimp [Shellfish Allergy] Shortness Of Breath    Outpatient Medications Prior to Visit  Medication Sig Dispense Refill   acetaminophen (TYLENOL) 500 MG tablet Take 2 tablets (1,000 mg total) by mouth every 6 (six) hours. 30 tablet 0   amoxicillin (AMOXIL) 875 MG tablet Take 1 tablet (875 mg total) by mouth 2 (two) times daily. 60 tablet 0   IBUPROFEN PO Take 3 tablets by mouth daily as needed (pain).     methocarbamol (ROBAXIN) 750 MG tablet Take 1 tablet (750 mg total) by mouth every 6 (six) hours as needed for muscle spasms. 36 tablet 0   oxyCODONE (OXY IR/ROXICODONE) 5 MG immediate release tablet Take 1-2 tablets (5-10 mg total) by mouth every 4 (four) hours as needed for moderate pain. (Patient not taking: No sig reported) 30 tablet 0   No facility-administered medications prior to visit.     ROS Review of Systems  Constitutional:  Negative for activity change and  appetite change.  HENT:  Negative for sinus pressure and sore throat.   Eyes:  Negative for visual disturbance.  Respiratory:  Negative for cough, chest tightness and shortness of breath.   Cardiovascular:  Negative for chest pain and leg swelling.  Gastrointestinal:  Positive for constipation. Negative for abdominal distention, abdominal pain and diarrhea.  Endocrine: Negative.   Genitourinary:  Negative for dysuria.  Musculoskeletal:        See HPI  Skin:  Negative for rash.   Allergic/Immunologic: Negative.   Neurological:  Negative for weakness, light-headedness and numbness.  Psychiatric/Behavioral:  Negative for dysphoric mood and suicidal ideas.    Objective:  BP (!) 135/94   Pulse 93   Ht 6' 1"  (1.854 m)   Wt 204 lb (92.5 kg)   SpO2 98%   BMI 26.91 kg/m   BP/Weight 05/27/2021 05/07/2021 6/71/2458  Systolic BP 099 833 825  Diastolic BP 94 81 63  Wt. (Lbs) 204 205 -  BMI 26.91 27.05 -  Some encounter information is confidential and restricted. Go to Review Flowsheets activity to see all data.      Physical Exam Constitutional:      Appearance: He is well-developed.     Comments: In pain  Cardiovascular:     Rate and Rhythm: Normal rate.     Heart sounds: Normal heart sounds. No murmur heard. Pulmonary:     Effort: Pulmonary effort is normal.     Breath sounds: Normal breath sounds. No wheezing or rales.  Chest:     Chest wall: No tenderness.  Abdominal:     General: Bowel sounds are normal. There is no distension.     Palpations: Abdomen is soft. There is no mass.     Tenderness: There is no abdominal tenderness.  Musculoskeletal:        General: Normal range of motion.     Right lower leg: No edema.     Left lower leg: No edema.  Neurological:     Mental Status: He is alert and oriented to person, place, and time.  Psychiatric:        Mood and Affect: Mood normal.    CMP Latest Ref Rng & Units 04/22/2021 04/21/2021 04/20/2021  Glucose 70 - 99 mg/dL 126(H) 111(H) 91  BUN 6 - 20 mg/dL 21(H) 16 17  Creatinine 0.61 - 1.24 mg/dL 1.60(H) 1.55(H) 1.62(H)  Sodium 135 - 145 mmol/L 136 138 137  Potassium 3.5 - 5.1 mmol/L 4.5 4.5 4.1  Chloride 98 - 111 mmol/L 110 109 111  CO2 22 - 32 mmol/L 18(L) 22 21(L)  Calcium 8.9 - 10.3 mg/dL 8.7(L) 8.7(L) 8.5(L)  Total Protein 6.5 - 8.1 g/dL - - 7.5  Total Bilirubin 0.3 - 1.2 mg/dL - - 0.8  Alkaline Phos 38 - 126 U/L - - 90  AST 15 - 41 U/L - - 17  ALT 0 - 44 U/L - - 25    Lipid Panel      Component Value Date/Time   CHOL 119 11/18/2020 1117   TRIG 111 11/18/2020 1117   HDL 33 (L) 11/18/2020 1117   CHOLHDL 3.6 11/18/2020 1117   CHOLHDL 3.1 11/20/2012 0439   VLDL 6 11/20/2012 0439   LDLCALC 65 11/18/2020 1117    CBC    Component Value Date/Time   WBC 11.8 (H) 04/20/2021 0114   RBC 3.56 (L) 04/20/2021 0114   HGB 11.6 (L) 04/20/2021 0114   HCT 33.6 (L) 04/20/2021 0114   PLT 324  04/20/2021 0114   MCV 94.4 04/20/2021 0114   MCH 32.6 04/20/2021 0114   MCHC 34.5 04/20/2021 0114   RDW 17.5 (H) 04/20/2021 0114   LYMPHSABS 3.4 04/19/2021 0408   MONOABS 0.7 04/19/2021 0408   EOSABS 0.5 04/19/2021 0408   BASOSABS 0.1 04/19/2021 0408    Lab Results  Component Value Date   HGBA1C 5.6 12/31/2020    Assessment & Plan:  1. Sacral osteomyelitis (HCC) Ongoing pain Currently on amoxicillin Keep follow-up appointments with infectious disease  2. Crohn's disease of small intestine with complication ([place]) With fistula formation Status post infusion of Remicade on 04/29/2021 He does seem to be confused about his care and I have done my best to explain the different specialties he needs to follow-up with for his different medical conditions Advised to follow-up with GI  3. Other constipation I do not see a diagnosis of IBS in his chart even though the patient thinks he has IBS Counseled on increasing fiber intake, water Will place on MiraLAX Given he is under the care of GI and does have underlying Crohn's disease I will defer to them with regards to management of constipation if MiraLAX is ineffective - polyethylene glycol powder (GLYCOLAX/MIRALAX) 17 GM/SCOOP powder; Take 17 g by mouth daily.  Dispense: 3350 g; Refill: 1  4. 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 For tail bone pain  Dispense: 30 capsule; Refill: 3 - Ambulatory referral to Pain Clinic - methocarbamol (ROBAXIN) 750 MG tablet; Take 1 tablet (750 mg total) by mouth every 8 (eight) hours as needed for muscle spasms.  Dispense: 90 tablet; Refill: 0    Meds ordered this encounter  Medications   polyethylene glycol powder (GLYCOLAX/MIRALAX) 17 GM/SCOOP powder    Sig: Take 17 g by mouth daily.    Dispense:  3350 g    Refill:  1   DULoxetine (CYMBALTA) 60 MG capsule    Sig: Take 1 capsule (60 mg total) by mouth daily. For tail bone pain    Dispense:  30 capsule    Refill:  3   methocarbamol (ROBAXIN) 750 MG tablet    Sig: Take 1 tablet (750 mg total) by mouth every 8 (eight) hours as needed for muscle spasms.    Dispense:  90 tablet    Refill:  0    Follow-up: Return in about 6 months (around 11/25/2021) for Medical conditions.       Charlott Rakes, MD, FAAFP. [facility] and [facility], [place]   05/27/2021, 3:17 PM

## 2021-05-27 NOTE — Progress Notes (Signed)
Pain in lower back.  Abdominal pain with constipation.

## 2021-06-03 ENCOUNTER — Emergency Department (HOSPITAL_COMMUNITY)
Admission: EM | Admit: 2021-06-03 | Discharge: 2021-06-03 | Disposition: A | Discharge status: Home / Self Care | Payer: 59 | Attending: Emergency Medicine | Admitting: Emergency Medicine

## 2021-06-03 ENCOUNTER — Ambulatory Visit: Payer: 59 | Admitting: Internal Medicine

## 2021-06-03 ENCOUNTER — Encounter (HOSPITAL_COMMUNITY): Payer: Self-pay | Admitting: Emergency Medicine

## 2021-06-03 ENCOUNTER — Encounter: Payer: Self-pay | Admitting: Family Medicine

## 2021-06-03 DIAGNOSIS — M545 Low back pain, unspecified: Secondary | ICD-10-CM | POA: Insufficient documentation

## 2021-06-03 DIAGNOSIS — D72829 Elevated white blood cell count, unspecified: Secondary | ICD-10-CM | POA: Insufficient documentation

## 2021-06-03 DIAGNOSIS — F17210 Nicotine dependence, cigarettes, uncomplicated: Secondary | ICD-10-CM | POA: Diagnosis not present

## 2021-06-03 DIAGNOSIS — I10 Essential (primary) hypertension: Secondary | ICD-10-CM | POA: Diagnosis not present

## 2021-06-03 DIAGNOSIS — G8929 Other chronic pain: Secondary | ICD-10-CM

## 2021-06-03 LAB — COMPREHENSIVE METABOLIC PANEL
ALT: 32 U/L (ref 0–44)
AST: 21 U/L (ref 15–41)
Albumin: 3.8 g/dL (ref 3.5–5.0)
Alkaline Phosphatase: 117 U/L (ref 38–126)
Anion gap: 10 (ref 5–15)
BUN: 13 mg/dL (ref 6–20)
CO2: 21 mmol/L — ABNORMAL LOW (ref 22–32)
Calcium: 9.1 mg/dL (ref 8.9–10.3)
Chloride: 107 mmol/L (ref 98–111)
Creatinine, Ser: 1.49 mg/dL — ABNORMAL HIGH (ref 0.61–1.24)
GFR, Estimated: >60 mL/min (ref >60)
Glucose, Bld: 114 mg/dL — ABNORMAL HIGH (ref 70–99)
Potassium: 3.7 mmol/L (ref 3.5–5.1)
Sodium: 138 mmol/L (ref 135–145)
Total Bilirubin: 0.6 mg/dL (ref 0.3–1.2)
Total Protein: 8.8 g/dL — ABNORMAL HIGH (ref 6.5–8.1)

## 2021-06-03 LAB — CBC WITH DIFFERENTIAL/PLATELET
Abs Immature Granulocytes: 0.08 10*3/uL — ABNORMAL HIGH (ref 0.00–0.07)
Basophils Absolute: 0.1 10*3/uL (ref 0.0–0.1)
Basophils Relative: 0 %
Eosinophils Absolute: 0.1 10*3/uL (ref 0.0–0.5)
Eosinophils Relative: 0 %
HCT: 31.5 % — ABNORMAL LOW (ref 39.0–52.0)
Hemoglobin: 10.5 g/dL — ABNORMAL LOW (ref 13.0–17.0)
Immature Granulocytes: 0 %
Lymphocytes Relative: 10 %
Lymphs Abs: 1.7 10*3/uL (ref 0.7–4.0)
MCH: 31.4 pg (ref 26.0–34.0)
MCHC: 33.3 g/dL (ref 30.0–36.0)
MCV: 94.3 fL (ref 80.0–100.0)
Monocytes Absolute: 0.8 10*3/uL (ref 0.1–1.0)
Monocytes Relative: 5 %
Neutro Abs: 15.3 10*3/uL — ABNORMAL HIGH (ref 1.7–7.7)
Neutrophils Relative %: 85 %
Platelets: 443 10*3/uL — ABNORMAL HIGH (ref 150–400)
RBC: 3.34 MIL/uL — ABNORMAL LOW (ref 4.22–5.81)
RDW: 16.2 % — ABNORMAL HIGH (ref 11.5–15.5)
WBC: 18.1 10*3/uL — ABNORMAL HIGH (ref 4.0–10.5)
nRBC: 0 % (ref 0.0–0.2)

## 2021-06-03 MED ORDER — KETOROLAC TROMETHAMINE 30 MG/ML IJ SOLN
30.0000 mg | Freq: Once | INTRAMUSCULAR | Status: AC
  Administered 2021-06-03: 30 mg via INTRAMUSCULAR
  Filled 2021-06-03: qty 1

## 2021-06-03 MED ORDER — OXYCODONE-ACETAMINOPHEN 5-325 MG PO TABS
2.0000 | ORAL_TABLET | Freq: Once | ORAL | Status: AC
  Administered 2021-06-03: 2 via ORAL
  Filled 2021-06-03: qty 2

## 2021-06-03 NOTE — ED Provider Notes (Signed)
[place] DEPT Provider Note   CSN: [id number] Arrival date & time: 06/03/21  1611     History Chief Complaint  Patient presents with   Back Pain    Shawn [name] is a 35 y.o. male.  Patient presents to the emergency department with a chief complaint of low back pain.  He has history of osteomyelitis of the sacrum.  He states that the pain increased today and it was keeping him from ambulating due to pain.  He denies any fever, chills, bowel or bladder incontinence, numbness, weakness, or tingling.  He states that he has been taking Cymbalta for pain.  States that he has been referred to pain management, but has not had a chance to see them yet.  He is on oral amoxicillin and is followed by infectious disease.  The history is provided by the patient. No language interpreter was used.      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   PO B12 initiated 12/31/20   Dilated cardiomyopathy ([place]) 05/18/2013   By catheterization 2014    Hypertension     Patient Active Problem List   Diagnosis Date Noted   Crohn's disease of small intestine ([place]) 04/29/2021   Osteomyelitis ([place]) 17/40/8144   Metabolic acidosis 81/85/6314   Abnormal CT scan, colon    Rectal pain    Perirectal abscess 12/30/2020   Perirectal cellulitis    Fistula    Renal lesion 06/04/2020   Proctitis 06/04/2020   Alcohol abuse 11/06/2019   Tobacco abuse 05/18/2013   Dilated cardiomyopathy ([place]) 05/18/2013   Abscess, perirectal, x2  05/18/2013   Chest pain at rest 11/20/2012   Hypertension 11/20/2012    Past Surgical History:  Procedure Laterality Date   BIOPSY  01/01/2021   Procedure: BIOPSY;  Surgeon: Ladene Artist, MD;  Location: [facility] ENDOSCOPY;  Service: Endoscopy;;   COLONOSCOPY WITH PROPOFOL N/A 01/01/2021   Procedure: COLONOSCOPY WITH PROPOFOL;  Surgeon: Ladene Artist, MD;  Location: [facility]  ENDOSCOPY;  Service: Endoscopy;  Laterality: N/A;   LEFT AND RIGHT HEART CATHETERIZATION WITH CORONARY ANGIOGRAM N/A 11/21/2012   Procedure: LEFT AND RIGHT HEART CATHETERIZATION WITH CORONARY ANGIOGRAM;  Surgeon: Birdie Riddle, MD;  Location: [facility] CATH LAB;  Service: Cardiovascular;  Laterality: N/A;   MANDIBLE FRACTURE SURGERY  2006       Family History  Problem Relation Age of Onset   Diabetes Mother    Hypertension Other     Social History   Tobacco Use   Smoking status: Every Day    Packs/day: 0.25    Types: Cigarettes   Smokeless tobacco: Never  Vaping Use   Vaping Use: Never used  Substance Use Topics   Alcohol use: Yes    Comment: occ   Drug use: No    Home Medications Prior to Admission medications   Medication Sig Start Date End Date Taking? Authorizing Provider  acetaminophen (TYLENOL) 500 MG tablet Take 2 tablets (1,000 mg total) by mouth every 6 (six) hours. 04/22/21   Nita Sells, MD  amoxicillin (AMOXIL) 875 MG tablet Take 1 tablet (875 mg total) by mouth 2 (two) times daily. 05/25/21   Comer, Okey Regal, MD  DULoxetine (CYMBALTA) 60 MG capsule Take 1 capsule (60 mg total) by mouth daily. For tail bone pain 05/27/21   Charlott Rakes, MD  IBUPROFEN PO Take 3 tablets by mouth daily as needed (pain).  [provider]  methocarbamol (ROBAXIN) 750 MG tablet Take 1 tablet (750 mg total) by mouth every 8 (eight) hours as needed for muscle spasms. 05/27/21   Charlott Rakes, MD  oxyCODONE (OXY IR/ROXICODONE) 5 MG immediate release tablet Take 1-2 tablets (5-10 mg total) by mouth every 4 (four) hours as needed for moderate pain. Patient not taking: No sig reported 04/22/21   Nita Sells, MD  polyethylene glycol powder (GLYCOLAX/MIRALAX) 17 GM/SCOOP powder Take 17 g by mouth daily. 05/27/21   Charlott Rakes, MD    Allergies    Shrimp [shellfish allergy]  Review of Systems   Review of Systems  All other systems reviewed and are  negative.  Physical Exam Updated Vital Signs BP (!) 162/97   Pulse 88   Temp 98.7 F (37.1 C) (Oral)   Resp 18   SpO2 100%   Physical Exam Vitals and nursing note reviewed.  Constitutional:      Appearance: He is well-developed.  HENT:     Head: Normocephalic and atraumatic.  Eyes:     Conjunctiva/sclera: Conjunctivae normal.  Cardiovascular:     Rate and Rhythm: Normal rate and regular rhythm.  Pulmonary:     Effort: Pulmonary effort is normal. No respiratory distress.     Breath sounds: Normal breath sounds.  Abdominal:     Palpations: Abdomen is soft.     Tenderness: There is no abdominal tenderness.  Musculoskeletal:     Cervical back: Neck supple.     Comments: No significant findings on external exam, no rash or redness, patient is moving all extremities and is standing putting on gown in when I enter the room  Skin:    General: Skin is warm and dry.  Neurological:     Mental Status: He is alert and oriented to person, place, and time.     Comments: Normal sensation of lower extremities Normal great toe extension Normal dorsi/plantar flexion    ED Results / Procedures / Treatments   Labs (all labs ordered are listed, but only abnormal results are displayed) Labs Reviewed  COMPREHENSIVE METABOLIC PANEL - Abnormal; Notable for the following components:      Result Value   CO2 21 (*)    Glucose, Bld 114 (*)    Creatinine, Ser 1.49 (*)    Total Protein 8.8 (*)    All other components within normal limits  CBC WITH DIFFERENTIAL/PLATELET - Abnormal; Notable for the following components:   WBC 18.1 (*)    RBC 3.34 (*)    Hemoglobin 10.5 (*)    HCT 31.5 (*)    RDW 16.2 (*)    Platelets 443 (*)    Neutro Abs 15.3 (*)    Abs Immature Granulocytes 0.08 (*)    All other components within normal limits    EKG None  Radiology No results found.  Procedures Procedures   Medications Ordered in ED Medications  ketorolac (TORADOL) 30 MG/ML injection 30 mg  (has no administration in time range)  oxyCODONE-acetaminophen (PERCOCET/ROXICET) 5-325 MG per tablet 2 tablet (has no administration in time range)    ED Course  I have reviewed the triage vital signs and the nursing notes.  Pertinent labs & imaging results that were available during my care of the patient were reviewed by me and considered in my medical decision making (see chart for details).    MDM Rules/Calculators/A&P  Patient here with acute on chronic low back pain 2/2 sacral osteomyelitis.  He is afebrile and non-toxic.  He has a moderate leukocytosis, but has been compliant with his amox prescribed by ID for the infection.  Recently was switched to cymbalta for pain because NSAIDs and Tylenol weren't working.  Has pain management follow-up.  Discussed that while we don't manage chronic pain in the ED, I will treat his pain now, but he will need to follow-up with pain management.  He's afebrile and doesn't have signs of cauda equina.  I don't think he needs any additional emergent workup for this condition. Final Clinical Impression(s) / ED Diagnoses Final diagnoses:  Chronic midline low back pain, unspecified whether sciatica present    Rx / DC Orders ED Discharge Orders     None        Montine Circle, PA-C 06/03/21 2216    Dorie Rank, MD 06/04/21 1429

## 2021-06-03 NOTE — Discharge Instructions (Addendum)
Please follow-up with the pain management doctor.  If you run a fever, have loss of control of your bowel or bladder, or paralysis please return to the ER.

## 2021-06-03 NOTE — ED Triage Notes (Signed)
Per EMS, states he has osteomyelitis in sacral region-was diagnosed in March-is on oral amoxicillin

## 2021-06-03 NOTE — ED Notes (Signed)
Pt refused VS re-assessment in ED lobby.

## 2021-06-03 NOTE — ED Provider Notes (Signed)
Emergency Medicine Provider Triage Evaluation Note  Shawn [name] , a 35 y.o. male  was evaluated in triage.  Pt complains of acute on chronic sacral pain.  He has sacral osteomyelitis.  He reports that his pain has been worsening recently.  He denies any fevers and states he otherwise feels well. When I walked into the triage room he was laying on the floor.  I confirmed with him that he chose to get out of the chair and lay on the floor and did not fall.  He states that he feels more comfortable laying on the floor.  Review of Systems  Positive: Back pain Negative: Fevers  Physical Exam  BP (!) 135/91 (BP Location: Right Arm)   Pulse 87   Temp 98.7 F (37.1 C) (Oral)   Resp 18   SpO2 100%  Gen:   Awake, no distress   Resp:  Normal effort  MSK:   Moves extremities without difficulty  Other:  Able to move legs bilaterally.    Medical Decision Making  Medically screening exam initiated at 4:48 PM.  Appropriate orders placed.  Shawn [name] was informed that the remainder of the evaluation will be completed by another provider, this initial triage assessment does not replace that evaluation, and the importance of remaining in the ED until their evaluation is complete.  Note: Portions of this report may have been transcribed using voice recognition software. Every effort was made to ensure accuracy; however, inadvertent computerized transcription errors may be present    Shawn [name] 06/03/21 1650    Dorie Rank, MD 06/03/21 [phone]

## 2021-06-04 ENCOUNTER — Other Ambulatory Visit: Payer: Self-pay | Admitting: Family Medicine

## 2021-06-04 MED ORDER — TRAMADOL HCL 50 MG PO TABS: 50.0000 mg | ORAL_TABLET | Freq: Every evening | ORAL | 1 refills | Status: AC | PRN

## 2021-06-05 ENCOUNTER — Telehealth: Payer: Self-pay

## 2021-06-05 NOTE — Telephone Encounter (Signed)
FYI  Reason for CRM: [name] with [employer] has called to share that the patient has been scheduled at [facility] 06/25/21    The patient will be seeing  [name] PA    Please contact further if needed

## 2021-06-11 ENCOUNTER — Other Ambulatory Visit: Payer: Self-pay

## 2021-06-11 ENCOUNTER — Encounter: Payer: Self-pay | Admitting: Internal Medicine

## 2021-06-11 ENCOUNTER — Ambulatory Visit (INDEPENDENT_AMBULATORY_CARE_PROVIDER_SITE_OTHER): Payer: 59 | Admitting: Internal Medicine

## 2021-06-11 DIAGNOSIS — K605 Anorectal fistula, unspecified: Secondary | ICD-10-CM | POA: Insufficient documentation

## 2021-06-11 DIAGNOSIS — M869 Osteomyelitis, unspecified: Secondary | ICD-10-CM

## 2021-06-11 DIAGNOSIS — Z720 Tobacco use: Secondary | ICD-10-CM

## 2021-06-12 ENCOUNTER — Encounter: Payer: Self-pay | Admitting: Internal Medicine

## 2021-06-12 NOTE — Progress Notes (Signed)
   Subjective:    Patient ID: Lenin Kuhnle, male    DOB: 1985/10/12, 35 y.o.   MRN: [id number]  HPI Here for follow up of osteomyelitis. 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  He had an MRI 9/26 which was a follow up from an August MRI and noted some worsening destructive process and some microabscesses.  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  He was scheduled to see Dr. Johney Maine last week to evaluate him but he failed to make the appointment.  He continues to have coccygeal pain and unable to sit up on it.  No fever.     Review of Systems  Constitutional:  Negative for chills, fatigue and fever.  Gastrointestinal:  Negative for diarrhea and nausea.  Skin:  Negative for rash.      Objective:   Physical Exam Eyes:     General: No scleral icterus. Pulmonary:     Effort: Pulmonary effort is normal.  Neurological:     General: No focal deficit present.     Mental Status: He is alert.  Psychiatric:        Mood and Affect: Mood normal.   SH: + tobacco       Assessment & Plan:

## 2021-06-12 NOTE — Assessment & Plan Note (Signed)
Discussed cessation

## 2021-06-12 NOTE — Assessment & Plan Note (Signed)
MRI suggests a fistula and I am concerned that this is leading to his destructive process in the coccygeal region and would require surgical management to correct this.  I am going to re refer him to Dr. Johney Maine to get his expert opinion on management of this or if further imaging indicated.  I discussed my concerns with the patient and need to keep his appointment with the surgeon.

## 2021-06-12 NOTE — Assessment & Plan Note (Signed)
He is on amoxicillin/clavulanate and at this point, as mentioned above, I suspect he will need surgical intervention to correct the ongoing contamination.   He will follow up after seeing the surgeon depending on plan.

## 2021-06-23 ENCOUNTER — Other Ambulatory Visit: Payer: Self-pay

## 2021-06-23 ENCOUNTER — Emergency Department (HOSPITAL_COMMUNITY)
Admission: EM | Admit: 2021-06-23 | Discharge: 2021-06-24 | Disposition: A | Discharge status: Home / Self Care | Payer: 59 | Attending: Emergency Medicine | Admitting: Emergency Medicine

## 2021-06-23 ENCOUNTER — Emergency Department (HOSPITAL_COMMUNITY): Payer: 59

## 2021-06-23 DIAGNOSIS — Z955 Presence of coronary angioplasty implant and graft: Secondary | ICD-10-CM | POA: Diagnosis not present

## 2021-06-23 DIAGNOSIS — I10 Essential (primary) hypertension: Secondary | ICD-10-CM | POA: Diagnosis not present

## 2021-06-23 DIAGNOSIS — K6289 Other specified diseases of anus and rectum: Secondary | ICD-10-CM | POA: Insufficient documentation

## 2021-06-23 DIAGNOSIS — R109 Unspecified abdominal pain: Secondary | ICD-10-CM | POA: Insufficient documentation

## 2021-06-23 DIAGNOSIS — F17210 Nicotine dependence, cigarettes, uncomplicated: Secondary | ICD-10-CM | POA: Diagnosis not present

## 2021-06-23 LAB — CBC WITH DIFFERENTIAL/PLATELET
Abs Immature Granulocytes: 0.05 10*3/uL (ref 0.00–0.07)
Basophils Absolute: 0.1 10*3/uL (ref 0.0–0.1)
Basophils Relative: 1 %
Eosinophils Absolute: 0.4 10*3/uL (ref 0.0–0.5)
Eosinophils Relative: 4 %
HCT: 27.4 % — ABNORMAL LOW (ref 39.0–52.0)
Hemoglobin: 9 g/dL — ABNORMAL LOW (ref 13.0–17.0)
Immature Granulocytes: 1 %
Lymphocytes Relative: 26 %
Lymphs Abs: 2.5 10*3/uL (ref 0.7–4.0)
MCH: 30.5 pg (ref 26.0–34.0)
MCHC: 32.8 g/dL (ref 30.0–36.0)
MCV: 92.9 fL (ref 80.0–100.0)
Monocytes Absolute: 0.7 10*3/uL (ref 0.1–1.0)
Monocytes Relative: 7 %
Neutro Abs: 6.1 10*3/uL (ref 1.7–7.7)
Neutrophils Relative %: 61 %
Platelets: 441 10*3/uL — ABNORMAL HIGH (ref 150–400)
RBC: 2.95 MIL/uL — ABNORMAL LOW (ref 4.22–5.81)
RDW: 15.5 % (ref 11.5–15.5)
WBC: 9.7 10*3/uL (ref 4.0–10.5)
nRBC: 0 % (ref 0.0–0.2)

## 2021-06-23 LAB — COMPREHENSIVE METABOLIC PANEL
ALT: 18 U/L (ref 0–44)
AST: 15 U/L (ref 15–41)
Albumin: 3.2 g/dL — ABNORMAL LOW (ref 3.5–5.0)
Alkaline Phosphatase: 108 U/L (ref 38–126)
Anion gap: 7 (ref 5–15)
BUN: 17 mg/dL (ref 6–20)
CO2: 19 mmol/L — ABNORMAL LOW (ref 22–32)
Calcium: 8.5 mg/dL — ABNORMAL LOW (ref 8.9–10.3)
Chloride: 108 mmol/L (ref 98–111)
Creatinine, Ser: 1.82 mg/dL — ABNORMAL HIGH (ref 0.61–1.24)
GFR, Estimated: 48 mL/min — ABNORMAL LOW (ref >60)
Glucose, Bld: 93 mg/dL (ref 70–99)
Potassium: 4.2 mmol/L (ref 3.5–5.1)
Sodium: 134 mmol/L — ABNORMAL LOW (ref 135–145)
Total Bilirubin: 0.4 mg/dL (ref 0.3–1.2)
Total Protein: 8 g/dL (ref 6.5–8.1)

## 2021-06-23 IMAGING — CT CT ABD-PELV W/ CM
2 of 4 series · 16 of 46 positions shown, 18 images · IV contrast (Omni 300)
Comparison: [DATE]

CLINICAL DATA: Abdominal pain.  History of Crohn's disease

EXAM:
CT ABDOMEN AND PELVIS WITH CONTRAST
TECHNIQUE: Multidetector CT imaging of the abdomen and pelvis was performed
using the standard protocol following bolus administration of
intravenous contrast.
CONTRAST:  70mL OMNIPAQUE IOHEXOL 300 MG/ML  SOLN

[Series 3: a/p w/ 5mm · axial · 0.85mm/px · z∈[+719,+1189]mm · 13 of 104 slices shown, 15 images]
[im 5/104  soft-tissue]
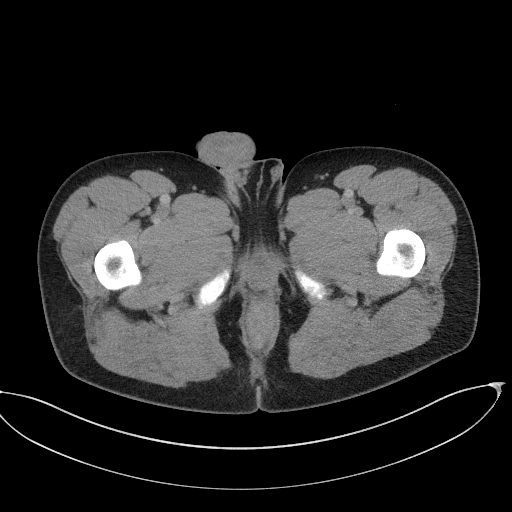
[im 5/104  bone]
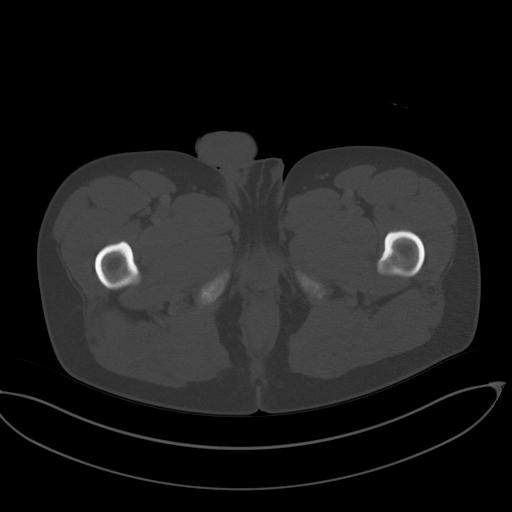
[im 14/104  soft-tissue]
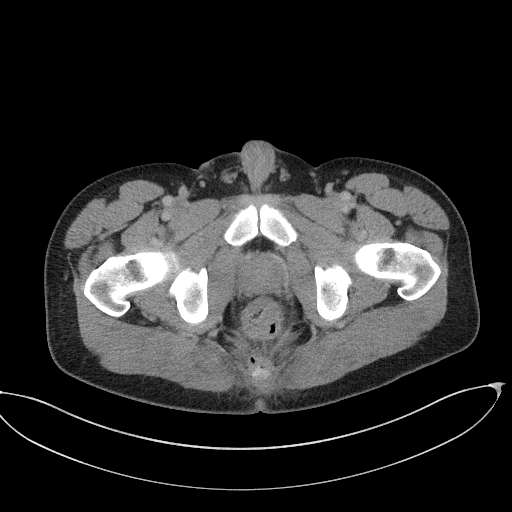
[im 23/104  soft-tissue]
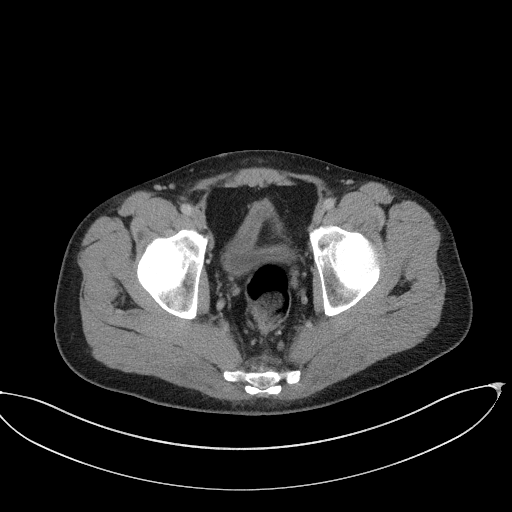
[im 27/104  soft-tissue]
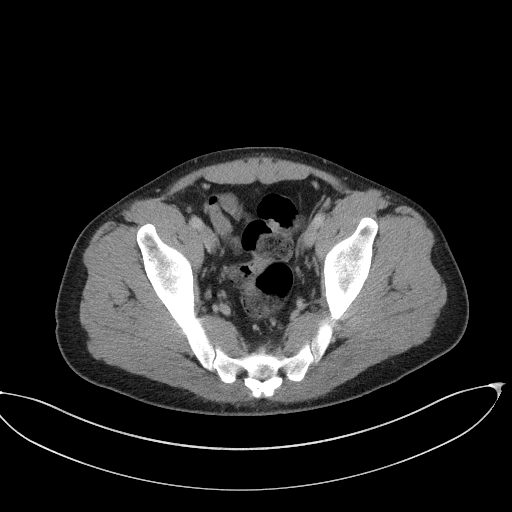
[im 36/104  soft-tissue]
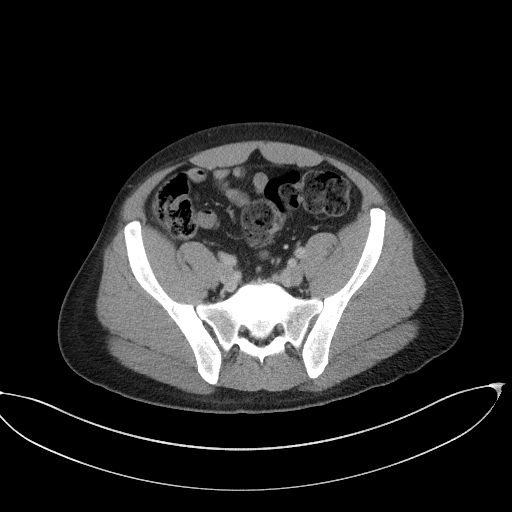
[im 45/104  soft-tissue]
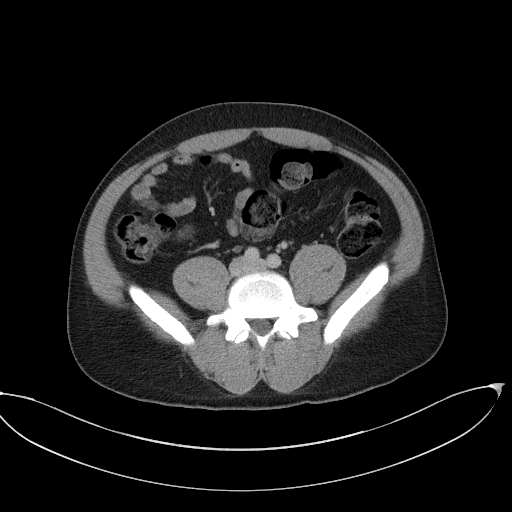
[im 54/104  soft-tissue]
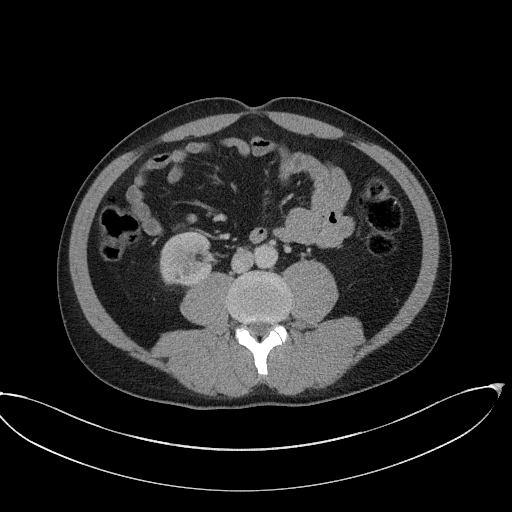
[im 59/104  soft-tissue]
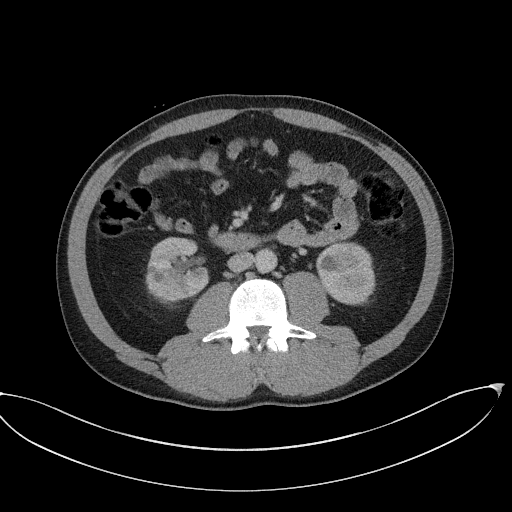
[im 68/104  soft-tissue]
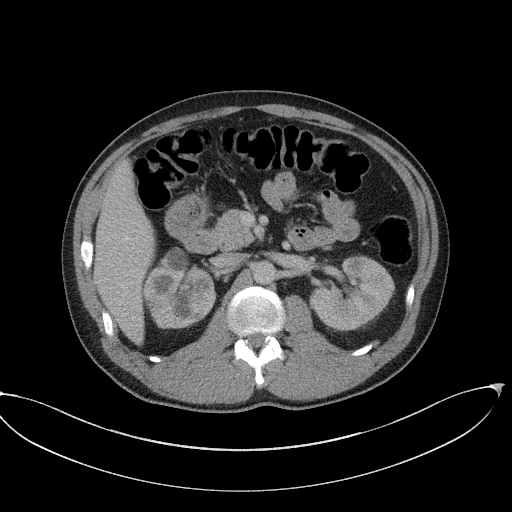
[im 68/104  bone]
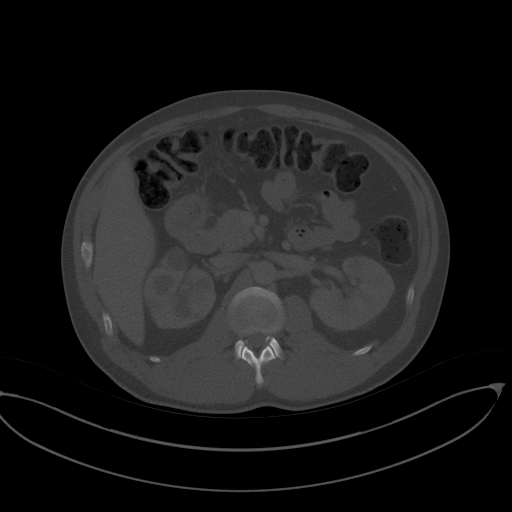
[im 77/104  soft-tissue]
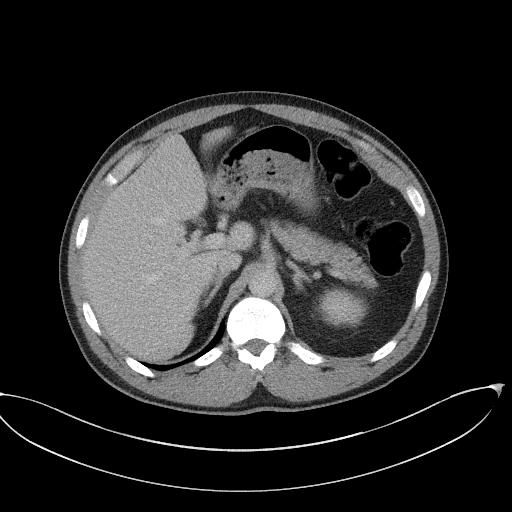
[im 81/104  soft-tissue]
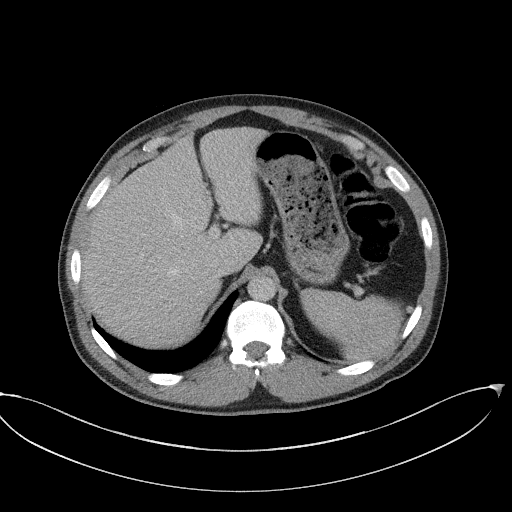
[im 90/104  soft-tissue]
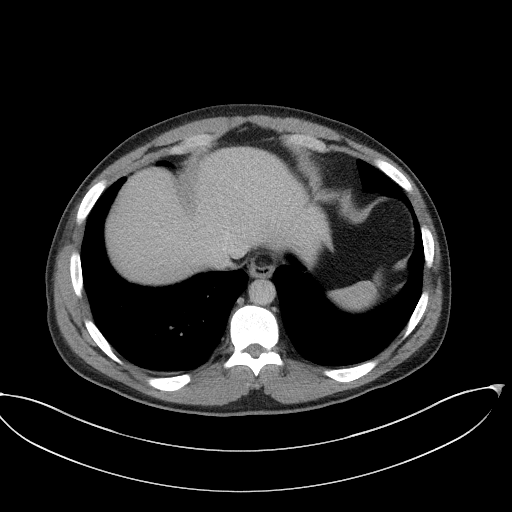
[im 99/104  soft-tissue]
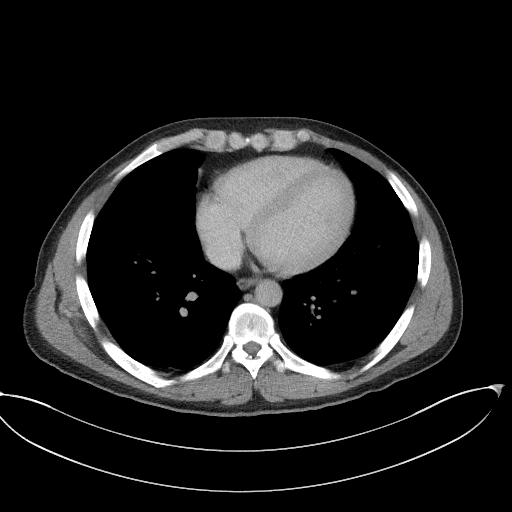

[Series 6: a/p w/ cor · coronal · 0.74mm/px · 3 of 138 slices shown]
[im 46/138  soft-tissue]
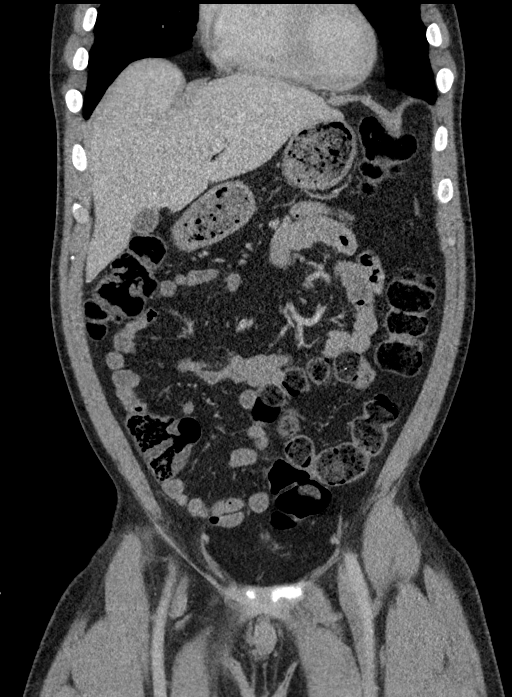
[im 61/138  soft-tissue]
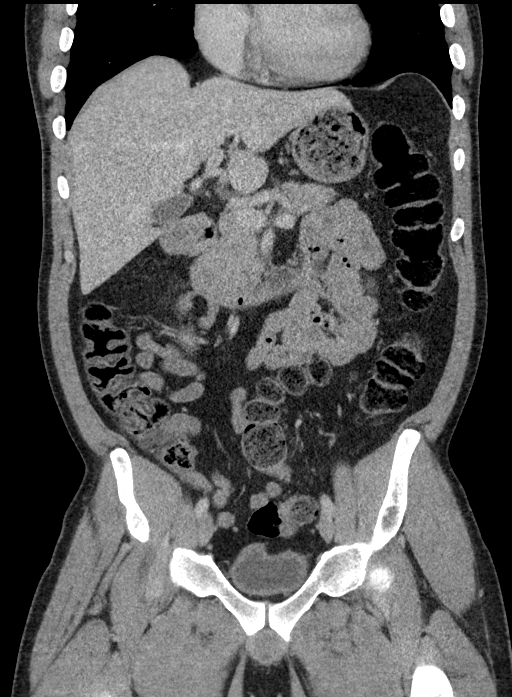
[im 77/138  soft-tissue]
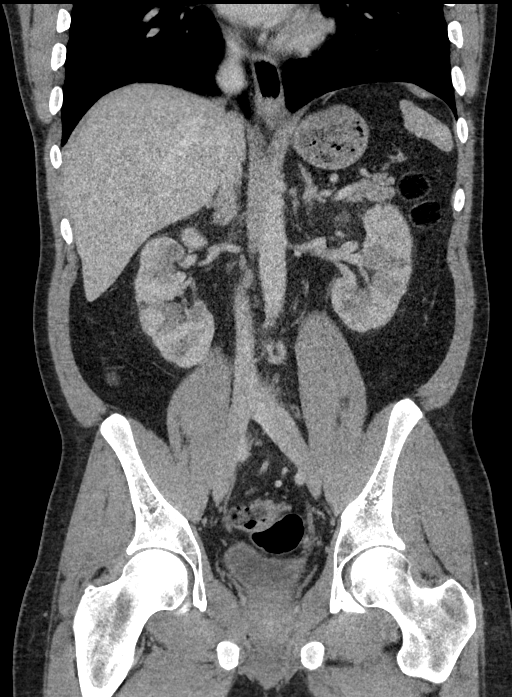

[16 of 46 positions shown; findings below may reference images not displayed]

FINDINGS: Lower chest: No acute abnormality

Hepatobiliary: No focal hepatic abnormality. Gallbladder
unremarkable.

Pancreas: No focal abnormality or ductal dilatation.

Spleen: No focal abnormality.  Normal size.

Adrenals/Urinary Tract: Numerous bilateral renal cysts. No
hydronephrosis. Adrenal glands and urinary bladder unremarkable.

Stomach/Bowel: Normal appendix. There is wall thickening noted
within the rectum, most notable posteriorly. Stranding noted in the
perirectal space and presacral space. Locules of gas are seen in the
presacral space adjacent to the coccyx. There is also gas in the
lateral rectum which may be within rectal wall. Stomach and small
bowel decompressed and unremarkable. Appendix normal.

Vascular/Lymphatic: No evidence of aneurysm or adenopathy.

Reproductive: No visible focal abnormality.

Other: There are locules of gas noted in the presacral space near
the tip of the coccyx and extending into the perineum below the
coccyx.

Musculoskeletal: No acute bony abnormality.
IMPRESSION: Parent wall thickening within the rectum with perirectal
inflammation and presacral inflammation. Gas is seen in the
presacral space and adjacent to the coccyx. This may be tracking
from the rectum and could reflect a perirectal abscess or fistula.

Numerous bilateral renal cysts.

## 2021-06-23 MED ORDER — HYDROCODONE-ACETAMINOPHEN 5-325 MG PO TABS
1.0000 | ORAL_TABLET | Freq: Once | ORAL | Status: AC
  Administered 2021-06-24: 1 via ORAL
  Filled 2021-06-23: qty 1

## 2021-06-23 MED ORDER — IOHEXOL 300 MG/ML  SOLN
70.0000 mL | Freq: Once | INTRAMUSCULAR | Status: AC | PRN
  Administered 2021-06-23: 70 mL via INTRAVENOUS

## 2021-06-23 NOTE — ED Triage Notes (Signed)
Pt with hx of perirectal abscess and Chron's disease here for eval of pain r/t same. Endorses abdominal pain, sacral pain, constipation, and nausea. Is supposed to be f/u with surgeon for fistula but has not done so yet.

## 2021-06-23 NOTE — ED Provider Notes (Signed)
Emergency Medicine Provider Triage Evaluation Note  Shawn [name] , a 35 y.o. male  was evaluated in triage.  Pt complains of chronic abdominal pain and coccyx pain.  Has a history of osteomyelitis which she with IV antibiotics and surgery for his osteomyelitis of the coccyx.  Also feels like his Crohn's has been flaring.  This is going on for months.  He was supposed to follow-up with surgery but states no one ever called him.  He reports associated nausea and 10/10 pain.  Denies any fevers or chills.  Review of Systems  Positive:  Negative: See above   Physical Exam  BP (!) 140/92 (BP Location: Right Arm)   Pulse 87   Temp 99 F (37.2 C) (Oral)   Resp 18   SpO2 100%  Gen:   Awake, no distress   Resp:  Normal effort  MSK:   Moves extremities without difficulty  Other:    Medical Decision Making  Medically screening exam initiated at 2:44 PM.  Appropriate orders placed.  Shawn [name] was informed that the remainder of the evaluation will be completed by another provider, this initial triage assessment does not replace that evaluation, and the importance of remaining in the ED until their evaluation is complete.     Myna [place], PA-C 06/23/21 1446    Truddie Hidden, MD 06/23/21 [phone]

## 2021-06-23 NOTE — ED Notes (Signed)
Pt refused vitals 

## 2021-06-24 MED ORDER — OXYCODONE HCL 5 MG PO TABS: 5.0000 mg | ORAL_TABLET | ORAL | 0 refills | Status: AC | PRN

## 2021-06-24 NOTE — Consult Note (Signed)
[facility] Surgery Consult Note  Shawn [name] 06-27-1986  [id number].    Requesting MD: Shawn [name] Chief Complaint/Reason for Consult: perirectal abscess  HPI:  Shawn [name] is a 35yo male with suspected Crohn's disease and multiple prior perirectal abscesses, who presented to [facility] yesterday complaining of worsening back pain. He has had multiple prior I&D of rectal abscesses, most recently 04/21/21 by Dr. Georgette [name]. Patient followed up in our office 05/20/21 and was referred to colorectal specialist Dr. Johney [name], whom he has an appointment with on 07/06/21. He has been seen by [facility] in the hospital but has not followed up in their office, that appointment is scheduled for 07/15/21 with Dr. Fuller [name]. He is not currently on any medications for Crohns. 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 He is followed by ID and is currently on augmentin, which he has been compliant with taking. He comes to the ED with some vague complaints. States that he was having abdominal pain, took tylenol and that resolved. He reports worsening back pain. When questioned he points to his mid back, states that he has been hurting for a couple weeks because he cannot sit up straight due to pain in his coccyx. Coccygeal pain is present but stable, no worse than his chronic pain in this area. Denies worsening perirectal pain, and no drainage. Denies fever or chills.  In the ED he underwent CT A/P which shows some wall thickening of the rectum with perirectal inflammation and presacral inflammation but no large perirectal abscess. WBC 9.7, VSS and afebrile.  Anticoagulants: none Smokes 4-5 cigarettes/ day Employment: customer service at [employer]  Musculoskeletal:  Positive for back pain.   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   PO B12 initiated 12/31/20   Dilated cardiomyopathy ([place]) 05/18/2013   By catheterization 2014    Hypertension     Past Surgical History:  Procedure Laterality Date   BIOPSY  01/01/2021   Procedure: BIOPSY;  Surgeon: Ladene Artist, MD;  Location: [facility] ENDOSCOPY;  Service: Endoscopy;;   COLONOSCOPY WITH PROPOFOL N/A 01/01/2021   Procedure: COLONOSCOPY WITH PROPOFOL;  Surgeon: Ladene Artist, MD;  Location: [facility] ENDOSCOPY;  Service: Endoscopy;  Laterality: N/A;   LEFT AND RIGHT HEART CATHETERIZATION WITH CORONARY ANGIOGRAM N/A 11/21/2012   Procedure: LEFT AND RIGHT HEART CATHETERIZATION WITH CORONARY ANGIOGRAM;  Surgeon: Birdie Riddle, MD;  Location: [facility] CATH LAB;  Service: Cardiovascular;  Laterality: N/A;   MANDIBLE FRACTURE SURGERY  2006    Social History:  reports that he has been smoking cigarettes. He has been smoking an average of .25 packs per day. He has never used smokeless tobacco. He reports current alcohol use. He reports that he does not use drugs.  Allergies:  Allergies  Allergen Reactions   Shrimp [Shellfish Allergy] Shortness Of Breath    (Not in a hospital admission)   Prior to Admission medications   Medication Sig Start Date End Date Taking? Authorizing Provider  oxyCODONE (ROXICODONE) 5 MG immediate release tablet Take 1 tablet (5 mg total) by mouth every 4 (four) hours as needed for up to 3 days for severe pain. 06/24/21 06/27/21 Yes Mickie Hillier, PA-C  acetaminophen (TYLENOL) 500 MG tablet Take 2 tablets (1,000 mg total) by mouth every 6 (six) hours. 04/22/21   Nita Sells, MD  amoxicillin (AMOXIL) 875 MG tablet Take 1 tablet (875 mg total) by mouth 2 (two) times daily. 05/25/21   Comer, Okey Regal, MD  DULoxetine (CYMBALTA) 60 MG capsule Take 1 capsule (60 mg total) by mouth daily. For tail bone pain Patient not taking: Reported on  06/11/2021 05/27/21   Charlott Rakes, MD  IBUPROFEN PO Take 3 tablets by mouth daily as needed (pain).    [provider]  methocarbamol (ROBAXIN) 750 MG tablet Take 1 tablet (750 mg total) by mouth every 8 (eight) hours as needed for muscle spasms. 05/27/21   Charlott Rakes, MD  polyethylene glycol powder (GLYCOLAX/MIRALAX) 17 GM/SCOOP powder Take 17 g by mouth daily. 05/27/21   Charlott Rakes, MD  traMADol (ULTRAM) 50 MG tablet Take by mouth every 6 (six) hours as needed.    [provider]    Blood pressure 116/75, pulse 71, temperature 97.7 F (36.5 C), temperature source Oral, resp. rate 16, SpO2 100 %. Physical Exam: General: pleasant, WD/WN male who is laying in bed in NAD HEENT: head is normocephalic, atraumatic.  Sclera are noninjected.  Pupils equal and round.  Ears and nose without any masses or lesions.  Mouth is pink and moist. Dentition fair Heart: regular, rate, and rhythm.  Normal s1,s2. No obvious murmurs, gallops, or rubs noted.  Palpable pedal pulses bilaterally  Lungs: CTAB, no wheezes, rhonchi, or rales noted.  Respiratory effort nonlabored Abd: soft, NT/ND, +BS, no masses, hernias, or organomegaly MS: no BUE/BLE edema, calves soft and nontender Skin: warm and dry with no masses, lesions, or rashes Psych: A&Ox4 with an appropriate affect Neuro: cranial nerves grossly intact, equal strength in BUE/BLE bilaterally, normal speech, thought process intact GU: no external signs of abscess, no erythema, fluctuance, or drainage. He has old well-healed scar from prior I&D. No obvious mass or fluctuance noted on digital rectal exam  Results for orders placed or performed during the hospital encounter of 06/23/21 (from the past 48 hour(s))  CBC with Differential     Status: Abnormal   Collection Time: 06/23/21  2:44 PM  Result Value Ref Range   WBC 9.7 4.0 - 10.5 K/uL   RBC 2.95 (L) 4.22 - 5.81 MIL/uL   Hemoglobin 9.0 (L) 13.0 - 17.0 g/dL   HCT 27.4 (L) 39.0 -  52.0 %   MCV 92.9 80.0 - 100.0 fL   MCH 30.5 26.0 - 34.0 pg   MCHC 32.8 30.0 - 36.0 g/dL   RDW 15.5 11.5 - 15.5 %   Platelets 441 (H) 150 - 400 K/uL   nRBC 0.0 0.0 - 0.2 %   Neutrophils Relative % 61 %   Neutro Abs 6.1 1.7 - 7.7 K/uL   Lymphocytes Relative 26 %   Lymphs Abs 2.5 0.7 - 4.0 K/uL   Monocytes Relative 7 %   Monocytes Absolute 0.7 0.1 - 1.0 K/uL   Eosinophils Relative 4 %   Eosinophils Absolute 0.4 0.0 - 0.5 K/uL   Basophils Relative 1 %   Basophils Absolute 0.1 0.0 - 0.1 K/uL   Immature Granulocytes 1 %   Abs Immature Granulocytes 0.05 0.00 - 0.07 K/uL    Comment: Performed at [facility], [address] [place],  [address]  Comprehensive metabolic panel     Status: Abnormal   Collection Time: 06/23/21  2:44 PM  Result Value  Ref Range   Sodium 134 (L) 135 - 145 mmol/L   Potassium 4.2 3.5 - 5.1 mmol/L   Chloride 108 98 - 111 mmol/L   CO2 19 (L) 22 - 32 mmol/L   Glucose, Bld 93 70 - 99 mg/dL    Comment: Glucose reference range applies only to samples taken after fasting for at least 8 hours.   BUN 17 6 - 20 mg/dL   Creatinine, Ser 1.82 (H) 0.61 - 1.24 mg/dL   Calcium 8.5 (L) 8.9 - 10.3 mg/dL   Total Protein 8.0 6.5 - 8.1 g/dL   Albumin 3.2 (L) 3.5 - 5.0 g/dL   AST 15 15 - 41 U/L   ALT 18 0 - 44 U/L   Alkaline Phosphatase 108 38 - 126 U/L   Total Bilirubin 0.4 0.3 - 1.2 mg/dL   GFR, Estimated 48 (L) >60 mL/min    Comment: (NOTE) Calculated using the CKD-EPI Creatinine Equation (2021)    Anion gap 7 5 - 15    Comment: Performed at [facility], [place] [address] [place], [place] [address]   CT ABDOMEN PELVIS W CONTRAST  Result Date: 06/23/2021 CLINICAL DATA:  Abdominal pain.  History of Crohn's disease EXAM: CT ABDOMEN AND PELVIS WITH CONTRAST TECHNIQUE: Multidetector CT imaging of the abdomen and pelvis was performed using the standard protocol following bolus administration of intravenous contrast. CONTRAST:  34m OMNIPAQUE IOHEXOL 300 MG/ML   SOLN COMPARISON:  06/04/2020 FINDINGS: Lower chest: No acute abnormality Hepatobiliary: No focal hepatic abnormality. Gallbladder unremarkable. Pancreas: No focal abnormality or ductal dilatation. Spleen: No focal abnormality.  Normal size. Adrenals/Urinary Tract: Numerous bilateral renal cysts. No hydronephrosis. Adrenal glands and urinary bladder unremarkable. Stomach/Bowel: Normal appendix. There is wall thickening noted within the rectum, most notable posteriorly. Stranding noted in the perirectal space and presacral space. Locules of gas are seen in the presacral space adjacent to the coccyx. There is also gas in the lateral rectum which may be within rectal wall. Stomach and small bowel decompressed and unremarkable. Appendix normal. Vascular/Lymphatic: No evidence of aneurysm or adenopathy. Reproductive: No visible focal abnormality. Other: There are locules of gas noted in the presacral space near the tip of the coccyx and extending into the perineum below the coccyx. Musculoskeletal: No acute bony abnormality. IMPRESSION: Parent wall thickening within the rectum with perirectal inflammation and presacral inflammation. Gas is seen in the presacral space and adjacent to the coccyx. This may be tracking from the rectum and could reflect a perirectal abscess or fistula. Numerous bilateral renal cysts. Electronically Signed   By: KRolm BaptiseM.D.   On: 06/23/2021 18:25      Assessment/[name] Suspected Crohn's disease  Multiple prior perirectal abscesses requiring I&D Osteomyelitis of coccyx with fistulous tract -No signs of drainable perirectal abscess on CT or physical exam. His WBC is WNL and VSS, afebrile. When questioned further his pain and reason for coming to the ED seems to be more mid-back and musculoskeletal, rather than worsening perirectal pain - this seems stable/chronic. No indication for I&D. Continue augmentin per ID for coccygeal osteomyelitis. Patient will follow up in our office on  07/06/21 as scheduled. He also has appointment scheduled with Dr. [facility] 07/15/21, with hopes to start remicade once infection has resolved. Upper back pain per ED. Encouraged smoking cessation.  BWellington Hampshire [facility]St[facility] 06/24/2021, 9:47 AM Please see Amion for pager number during day hours 7:00am-4:30pm

## 2021-06-24 NOTE — ED Notes (Signed)
Pt states he fell asleep in his car and would like to be seen again

## 2021-06-24 NOTE — ED Provider Notes (Signed)
[facility] DEPARTMENT Provider Note   CSN: [id number] Arrival date & time: 06/23/21  1327     History Chief Complaint  Patient presents with   Rectal Pain    Shawn [name] is a 35 y.o. male.  With past medical history of Crohn's disease complicated by perirectal abscess, anorectal fistula, hypertension and cardiomyopathy who presents emergency department with coccyx pain.  States that he has been dealing with osteomyelitis and perirectal abscess/analrectal fistula since March.  States that this is a complication of his Crohn's disease.  States that for the past week he has had increasing coccygeal pain with sitting.  10/10, sharp, constant pain.  States that he has had difficulty sitting and has been having increasing back pain from sitting awkward positions.  He does endorse some leaking from his rectum but is unsure if this is related to the anorectal fistulas or his Crohn's disease.  Denies bleeding from his rectum, fever, nausea or vomiting, dysuria, penile pain, testicular pain. Endorses compliance on Amoxicillin prescribed by ID most recently on 05/25/2021   On chart review it does appear that he has had ongoing perirectal cellulitis, perirectal abscesses and coccygeal osteomyelitis for which she is followed by infectious disease and [facility].  He was most recently admitted on 04/11/2021 for osteomyelitis and seen by general surgery and had internal drainage.  Appears he was placed on amoxicillin by infectious disease.  He most recently had an MRI on 9/26 with worsening destructive process of the coccyx and micro abscesses.  He was scheduled to see Dr. Johney Maine and was unable to make that appointment.  He said he is rescheduled for 07/06/2021.  He also states that his abdominal pain is started increasing which feels like his usual Crohn's pain.  He denies nausea or vomiting.  He states that he has been having nonbloody loose bowel movements and  leaking.  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   Alcohol abuse 11/06/2019   Tobacco abuse 05/18/2013   Dilated cardiomyopathy ([place]) 05/18/2013   Abscess, perirectal, x2  05/18/2013   Chest pain at rest 11/20/2012   Hypertension 11/20/2012    Past Surgical History:  Procedure Laterality Date   BIOPSY  01/01/2021   Procedure: BIOPSY;  Surgeon: Ladene Artist, MD;  Location: [facility] ENDOSCOPY;  Service: Endoscopy;;   COLONOSCOPY WITH PROPOFOL N/A 01/01/2021   Procedure: COLONOSCOPY WITH PROPOFOL;  Surgeon: Ladene Artist, MD;  Location: [facility] ENDOSCOPY;  Service: Endoscopy;  Laterality: N/A;   LEFT AND RIGHT HEART CATHETERIZATION WITH CORONARY ANGIOGRAM N/A 11/21/2012   Procedure: LEFT AND RIGHT HEART CATHETERIZATION WITH CORONARY ANGIOGRAM;  Surgeon: Birdie Riddle, MD;  Location: [facility] CATH LAB;  Service: Cardiovascular;  Laterality: N/A;   MANDIBLE FRACTURE SURGERY  2006       Family History  Problem Relation Age of Onset  Diabetes Mother    Hypertension Other     Social History   Tobacco Use   Smoking status: Every Day    Packs/day: 0.25    Types: Cigarettes   Smokeless tobacco: Never  Vaping Use   Vaping Use: Never used  Substance Use Topics   Alcohol use: Yes    Comment: occ   Drug use: No    Home Medications Prior to Admission medications   Medication Sig Start Date End Date Taking? Authorizing Provider   acetaminophen (TYLENOL) 500 MG tablet Take 2 tablets (1,000 mg total) by mouth every 6 (six) hours. 04/22/21   Nita Sells, MD  amoxicillin (AMOXIL) 875 MG tablet Take 1 tablet (875 mg total) by mouth 2 (two) times daily. 05/25/21   Comer, Okey Regal, MD  DULoxetine (CYMBALTA) 60 MG capsule Take 1 capsule (60 mg total) by mouth daily. For tail bone pain Patient not taking: Reported on 06/11/2021 05/27/21   Charlott Rakes, MD  IBUPROFEN PO Take 3 tablets by mouth daily as needed (pain).    [provider]  methocarbamol (ROBAXIN) 750 MG tablet Take 1 tablet (750 mg total) by mouth every 8 (eight) hours as needed for muscle spasms. 05/27/21   Charlott Rakes, MD  polyethylene glycol powder (GLYCOLAX/MIRALAX) 17 GM/SCOOP powder Take 17 g by mouth daily. 05/27/21   Charlott Rakes, MD  traMADol (ULTRAM) 50 MG tablet Take by mouth every 6 (six) hours as needed.    [provider]    Allergies    Shrimp [shellfish allergy]  Review of Systems   Review of Systems  Constitutional:  Positive for chills. Negative for fever.  Gastrointestinal:  Positive for abdominal pain, diarrhea and rectal pain. Negative for blood in stool, nausea and vomiting.  Genitourinary:  Negative for dysuria and penile pain.  All other systems reviewed and are negative.  Physical Exam Updated Vital Signs BP 133/74 (BP Location: Right Arm)   Pulse 79   Temp (!) 97.5 F (36.4 C) (Oral)   Resp 18   SpO2 100%   Physical Exam Vitals and nursing note reviewed. Exam conducted with a chaperone present.  Constitutional:      General: He is not in acute distress.    Appearance: Normal appearance. He is not toxic-appearing.  HENT:     Head: Normocephalic.  Eyes:     General: No scleral icterus.    Extraocular Movements: Extraocular movements intact.  Cardiovascular:     Rate and Rhythm: Normal rate and regular rhythm.     Pulses: Normal pulses.     Heart sounds: No murmur heard. Pulmonary:      Effort: Pulmonary effort is normal. No respiratory distress.     Breath sounds: Normal breath sounds.  Abdominal:     General: Bowel sounds are normal. There is no distension.     Palpations: Abdomen is soft.     Tenderness: There is abdominal tenderness.  Genitourinary:    Penis: Normal.      Rectum: Tenderness present. No mass, anal fissure or external hemorrhoid. Normal anal tone.     Comments: The gluteal cleft is without redness, swelling.  The skin is intact with signs of cellulitis.  He does have exquisite tenderness to palpation of the coccyx.  The rectum is without external hemorrhoids, anal fissure, mass.  He has old well-healed scars from previous incision and drainage.  He has perirectal pain to palpation, perineal pain.  On digital rectal exam there is no obvious mass or  fluctuance. Musculoskeletal:        General: Normal range of motion.     Cervical back: Normal range of motion.  Skin:    General: Skin is warm and dry.     Capillary Refill: Capillary refill takes less than 2 seconds.  Neurological:     General: No focal deficit present.     Mental Status: He is alert and oriented to person, place, and time. Mental status is at baseline.  Psychiatric:        Mood and Affect: Mood normal.        Behavior: Behavior normal.    ED Results / Procedures / Treatments   Labs (all labs ordered are listed, but only abnormal results are displayed) Labs Reviewed  CBC WITH DIFFERENTIAL/PLATELET - Abnormal; Notable for the following components:      Result Value   RBC 2.95 (*)    Hemoglobin 9.0 (*)    HCT 27.4 (*)    Platelets 441 (*)    All other components within normal limits  COMPREHENSIVE METABOLIC PANEL - Abnormal; Notable for the following components:   Sodium 134 (*)    CO2 19 (*)    Creatinine, Ser 1.82 (*)    Calcium 8.5 (*)    Albumin 3.2 (*)    GFR, Estimated 48 (*)    All other components within normal limits  RESP PANEL BY RT-PCR (FLU A&B, COVID) ARPGX2    EKG None  Radiology CT ABDOMEN PELVIS W CONTRAST  Result Date: 06/23/2021 CLINICAL DATA:  Abdominal pain.  History of Crohn's disease EXAM: CT ABDOMEN AND PELVIS WITH CONTRAST TECHNIQUE: Multidetector CT imaging of the abdomen and pelvis was performed using the standard protocol following bolus administration of intravenous contrast. CONTRAST:  15m OMNIPAQUE IOHEXOL 300 MG/ML  SOLN COMPARISON:  06/04/2020 FINDINGS: Lower chest: No acute abnormality Hepatobiliary: No focal hepatic abnormality. Gallbladder unremarkable. Pancreas: No focal abnormality or ductal dilatation. Spleen: No focal abnormality.  Normal size. Adrenals/Urinary Tract: Numerous bilateral renal cysts. No hydronephrosis. Adrenal glands and urinary bladder unremarkable. Stomach/Bowel: Normal appendix. There is wall thickening noted within the rectum, most notable posteriorly. Stranding noted in the perirectal space and presacral space. Locules of gas are seen in the presacral space adjacent to the coccyx. There is also gas in the lateral rectum which may be within rectal wall. Stomach and small bowel decompressed and unremarkable. Appendix normal. Vascular/Lymphatic: No evidence of aneurysm or adenopathy. Reproductive: No visible focal abnormality. Other: There are locules of gas noted in the presacral space near the tip of the coccyx and extending into the perineum below the coccyx. Musculoskeletal: No acute bony abnormality. IMPRESSION: Parent wall thickening within the rectum with perirectal inflammation and presacral inflammation. Gas is seen in the presacral space and adjacent to the coccyx. This may be tracking from the rectum and could reflect a perirectal abscess or fistula. Numerous bilateral renal cysts. Electronically Signed   By: KRolm BaptiseM.D.   On: 06/23/2021 18:25    Procedures Procedures   Medications Ordered in ED Medications  HYDROcodone-acetaminophen (NORCO/VICODIN) 5-325 MG per tablet 1 tablet (has no  administration in time range)  iohexol (OMNIPAQUE) 300 MG/ML solution 70 mL (70 mLs Intravenous Contrast Given 06/23/21 1814)    ED Course  I have reviewed the triage vital signs and the nursing notes.  Pertinent labs & imaging results that were available during my care of the patient were reviewed by me and considered in my medical decision making (see chart for  details).  0830: Spoke with Dr. Ileene Rubens with general surgery who will come see the patient   MDM Rules/Calculators/A&P 35 year old male who presents emergency department with rectal pain.  Given his significant history of Crohn's and perirectal abscesses basic labs and CT abdomen pelvis with contrast was obtained.  He is non-toxic in appearance, afebrile, no leukocytosis on labs.  Doubt he is systemically infected.  CT abdomen pelvis with parental wall thickening within the rectum and perirectal inflammation and presacral inflammation.  Gas in the presacral space and adjacent to the coccyx.  May be tracking from the rectum and could reflect perirectal abscess or fistula. Discussed case with Dr. Ileene Rubens with general surgery who does not recommend further imaging at this time and recommends no surgical intervention. Recommends GI and surgery follow-up appointments. Patient is scheduled to see Dr. Fuller Plan on 07/09/21, he also states that he has follow-up appointments with GI on 07/16/2021.  He also states that he has a spine pain appointment on Thursday.  I have instructed him to please make these appointments as they can provide more definitive treatment for him. I will discharge him with 5 tablets of oxycodone to make it to his pain appointment.  I have instructed him not to use this with tramadol.  Has been provided with opiate education. Vitals are stable with stable for discharge.  Final Clinical Impression(s) / ED Diagnoses Final diagnoses:  Anal or rectal pain   Rx / DC Orders ED Discharge Orders          Ordered    oxyCODONE  (ROXICODONE) 5 MG immediate release tablet  Every 4 hours PRN        06/24/21 0926             Mickie Hillier, PA-C 06/24/21 [id number]    Blanchie Dessert, MD 06/29/21 1410

## 2021-06-24 NOTE — ED Notes (Signed)
Patient given discharge instructions. Questions were answered. Patient verbalized understanding of discharge instructions and care at home.  

## 2021-06-24 NOTE — Discharge Instructions (Addendum)
You were seen in the emergency department for pain in your bottom and back. While you were here we got CT imaging which showed that you are having on-going symptoms from the perirectal fistula. You have follow-up appointments set up with general surgery, GI and pain doctors. Please ensure that you make all of these appointments as they will be able to provide more definitive treatment for you. Return to the emergency department if you begin having pus-like discharge from your bottom or fevers. I have prescribed you oxycodone.  You have been prescribed a medication that is considered an opiate. Opiates are pain medications that should be used with caution. It is important that you do not drive while taking this medication as it can cause drowsiness and impaired reaction times. Do not mix this medication with benzodiazepine medications or alcohol as this can cause respiratory depression. Additionally, opiates have addicting properties to them. Please use medication as prescribed by your provider.  Please do not take oxycodone with tramadol.

## 2021-07-02 ENCOUNTER — Telehealth: Payer: Self-pay

## 2021-07-02 DIAGNOSIS — M869 Osteomyelitis, unspecified: Secondary | ICD-10-CM

## 2021-07-02 DIAGNOSIS — K605 Anorectal fistula: Secondary | ICD-10-CM

## 2021-07-02 MED ORDER — AMOXICILLIN 875 MG PO TABS: 875.0000 mg | ORAL_TABLET | Freq: Two times a day (BID) | ORAL | 0 refills | Status: DC

## 2021-07-02 NOTE — Telephone Encounter (Signed)
Patient called concerned about his "tailbone pain." He was recently seen in the ED and evaluated by surgery. He has follow up with surgery scheduled for 11/14, but says he doesn't know what to do about the pain. RN advised him that our office does not prescribe pain medication.   He says he is out of his amoxicillin. He is also wanting someone to explain his MRI from 9/24 in detail to him.   Will route to provider.   Beryle Flock, RN

## 2021-07-02 NOTE — Addendum Note (Signed)
Addended by: Lucie Leather D on: 07/02/2021 02:08 PM   Modules accepted: Orders

## 2021-07-03 NOTE — Telephone Encounter (Signed)
Attempted to call patient again to relay recommendations, no answer.   Beryle Flock, RN

## 2021-07-06 ENCOUNTER — Ambulatory Visit: Payer: Self-pay | Admitting: Surgery

## 2021-07-06 MED ORDER — ACETAMINOPHEN 500 MG PO TABS: 1000.0000 mg | ORAL_TABLET | ORAL | Status: AC

## 2021-07-06 MED ORDER — CEFTRIAXONE SODIUM 2 G IJ SOLR
2.0000 g | INTRAMUSCULAR | Status: AC
  Administered 2021-07-07: 2 g via INTRAVENOUS

## 2021-07-06 MED ORDER — ENSURE PRE-SURGERY PO LIQD: 296.0000 mL | Freq: Once | ORAL | Status: AC

## 2021-07-06 MED ORDER — CHLORHEXIDINE GLUCONATE CLOTH 2 % EX PADS: 6.0000 | MEDICATED_PAD | Freq: Once | CUTANEOUS | Status: AC

## 2021-07-06 MED ORDER — BUPIVACAINE LIPOSOME 1.3 % IJ SUSP: 20.0000 mL | Freq: Once | INTRAMUSCULAR | Status: DC

## 2021-07-06 MED ORDER — GABAPENTIN 100 MG PO CAPS: 300.0000 mg | ORAL_CAPSULE | ORAL | Status: AC

## 2021-07-07 ENCOUNTER — Encounter (HOSPITAL_COMMUNITY): Payer: Self-pay | Admitting: Surgery

## 2021-07-07 ENCOUNTER — Other Ambulatory Visit: Payer: Self-pay

## 2021-07-07 ENCOUNTER — Inpatient Hospital Stay (HOSPITAL_COMMUNITY)
Admission: RE | Admit: 2021-07-07 | Discharge: 2021-07-10 | DRG: 516 | Disposition: A | Discharge status: Home / Self Care | Payer: 59 | Attending: Surgery | Admitting: Surgery

## 2021-07-07 ENCOUNTER — Ambulatory Visit (HOSPITAL_COMMUNITY): Payer: 59 | Admitting: Anesthesiology

## 2021-07-07 ENCOUNTER — Encounter (HOSPITAL_COMMUNITY)
Admission: RE | Disposition: A | Discharge status: Home / Self Care | Payer: Self-pay | Source: Home / Self Care | Attending: Surgery

## 2021-07-07 DIAGNOSIS — K603 Anal fistula, unspecified: Secondary | ICD-10-CM | POA: Diagnosis present

## 2021-07-07 DIAGNOSIS — Z91013 Allergy to seafood: Secondary | ICD-10-CM

## 2021-07-07 DIAGNOSIS — R918 Other nonspecific abnormal finding of lung field: Secondary | ICD-10-CM | POA: Diagnosis present

## 2021-07-07 DIAGNOSIS — K60329 Anal fistula, complex, unspecified: Secondary | ICD-10-CM | POA: Diagnosis present

## 2021-07-07 DIAGNOSIS — K651 Peritoneal abscess: Secondary | ICD-10-CM | POA: Diagnosis present

## 2021-07-07 DIAGNOSIS — D638 Anemia in other chronic diseases classified elsewhere: Secondary | ICD-10-CM | POA: Diagnosis present

## 2021-07-07 DIAGNOSIS — M4628 Osteomyelitis of vertebra, sacral and sacrococcygeal region: Principal | ICD-10-CM | POA: Diagnosis present

## 2021-07-07 DIAGNOSIS — K610 Anal abscess: Secondary | ICD-10-CM | POA: Diagnosis present

## 2021-07-07 DIAGNOSIS — D519 Vitamin B12 deficiency anemia, unspecified: Secondary | ICD-10-CM | POA: Diagnosis present

## 2021-07-07 DIAGNOSIS — Z1611 Resistance to penicillins: Secondary | ICD-10-CM | POA: Diagnosis present

## 2021-07-07 DIAGNOSIS — N179 Acute kidney failure, unspecified: Secondary | ICD-10-CM | POA: Diagnosis present

## 2021-07-07 DIAGNOSIS — E872 Acidosis, unspecified: Secondary | ICD-10-CM | POA: Diagnosis present

## 2021-07-07 DIAGNOSIS — K6289 Other specified diseases of anus and rectum: Secondary | ICD-10-CM | POA: Diagnosis present

## 2021-07-07 DIAGNOSIS — Z79899 Other long term (current) drug therapy: Secondary | ICD-10-CM

## 2021-07-07 DIAGNOSIS — R0609 Other forms of dyspnea: Secondary | ICD-10-CM

## 2021-07-07 DIAGNOSIS — E860 Dehydration: Secondary | ICD-10-CM | POA: Diagnosis present

## 2021-07-07 DIAGNOSIS — D62 Acute posthemorrhagic anemia: Secondary | ICD-10-CM

## 2021-07-07 DIAGNOSIS — K5900 Constipation, unspecified: Secondary | ICD-10-CM | POA: Diagnosis present

## 2021-07-07 DIAGNOSIS — B9620 Unspecified Escherichia coli [E. coli] as the cause of diseases classified elsewhere: Secondary | ICD-10-CM | POA: Diagnosis present

## 2021-07-07 DIAGNOSIS — K50113 Crohn's disease of large intestine with fistula: Secondary | ICD-10-CM | POA: Diagnosis present

## 2021-07-07 DIAGNOSIS — D509 Iron deficiency anemia, unspecified: Secondary | ICD-10-CM

## 2021-07-07 DIAGNOSIS — K6131 Horseshoe abscess: Secondary | ICD-10-CM | POA: Diagnosis present

## 2021-07-07 DIAGNOSIS — D72829 Elevated white blood cell count, unspecified: Secondary | ICD-10-CM | POA: Diagnosis present

## 2021-07-07 DIAGNOSIS — I10 Essential (primary) hypertension: Secondary | ICD-10-CM | POA: Diagnosis present

## 2021-07-07 DIAGNOSIS — E538 Deficiency of other specified B group vitamins: Secondary | ICD-10-CM

## 2021-07-07 DIAGNOSIS — F17210 Nicotine dependence, cigarettes, uncomplicated: Secondary | ICD-10-CM | POA: Diagnosis present

## 2021-07-07 DIAGNOSIS — E875 Hyperkalemia: Secondary | ICD-10-CM | POA: Diagnosis present

## 2021-07-07 DIAGNOSIS — I420 Dilated cardiomyopathy: Secondary | ICD-10-CM | POA: Diagnosis present

## 2021-07-07 DIAGNOSIS — D75838 Other thrombocytosis: Secondary | ICD-10-CM | POA: Diagnosis present

## 2021-07-07 DIAGNOSIS — D75839 Thrombocytosis, unspecified: Secondary | ICD-10-CM | POA: Diagnosis present

## 2021-07-07 DIAGNOSIS — Z20822 Contact with and (suspected) exposure to covid-19: Secondary | ICD-10-CM | POA: Diagnosis present

## 2021-07-07 HISTORY — PX: AV FISTULA REPAIR: SHX563

## 2021-07-07 HISTORY — PX: PLACEMENT OF SETON: SHX6029

## 2021-07-07 HISTORY — PX: COCCYGECTOMY: SUR274

## 2021-07-07 HISTORY — DX: Osteomyelitis of vertebra, sacral and sacrococcygeal region: M46.28

## 2021-07-07 HISTORY — PX: INCISION AND DRAINAGE ABSCESS: SHX5864

## 2021-07-07 SURGERY — INCISION AND DRAINAGE, ABSCESS
Anesthesia: General | Site: Rectum

## 2021-07-07 MED ORDER — CHLORHEXIDINE GLUCONATE 0.12 % MT SOLN
15.0000 mL | Freq: Once | OROMUCOSAL | Status: AC
  Administered 2021-07-07: 15 mL via OROMUCOSAL

## 2021-07-07 MED ORDER — ACETAMINOPHEN 500 MG PO TABS: 1000.0000 mg | ORAL_TABLET | ORAL | Status: DC

## 2021-07-07 MED ORDER — ALBUMIN HUMAN 5 % IV SOLN
INTRAVENOUS | Status: AC
  Filled 2021-07-07: qty 250

## 2021-07-07 MED ORDER — DULOXETINE HCL 30 MG PO CPEP
60.0000 mg | ORAL_CAPSULE | Freq: Every day | ORAL | Status: DC
  Filled 2021-07-07 (×2): qty 2
  Filled 2021-07-07: qty 1

## 2021-07-07 MED ORDER — SODIUM CHLORIDE 0.9 % IV SOLN: Freq: Three times a day (TID) | INTRAVENOUS | Status: DC | PRN

## 2021-07-07 MED ORDER — ENSURE PRE-SURGERY PO LIQD: 296.0000 mL | Freq: Once | ORAL | Status: DC

## 2021-07-07 MED ORDER — FENTANYL CITRATE (PF) 250 MCG/5ML IJ SOLN
INTRAMUSCULAR | Status: AC
  Filled 2021-07-07: qty 5

## 2021-07-07 MED ORDER — FENTANYL CITRATE PF 50 MCG/ML IJ SOSY
25.0000 ug | PREFILLED_SYRINGE | INTRAMUSCULAR | Status: DC | PRN
  Administered 2021-07-07 (×3): 50 ug via INTRAVENOUS

## 2021-07-07 MED ORDER — PHENYLEPHRINE 40 MCG/ML (10ML) SYRINGE FOR IV PUSH (FOR BLOOD PRESSURE SUPPORT)
PREFILLED_SYRINGE | INTRAVENOUS | Status: DC | PRN
  Administered 2021-07-07: 120 ug via INTRAVENOUS
  Administered 2021-07-07 (×2): 80 ug via INTRAVENOUS
  Administered 2021-07-07: 120 ug via INTRAVENOUS

## 2021-07-07 MED ORDER — CALCIUM POLYCARBOPHIL 625 MG PO TABS
625.0000 mg | ORAL_TABLET | Freq: Two times a day (BID) | ORAL | Status: DC
  Administered 2021-07-07 – 2021-07-10 (×6): 625 mg via ORAL
  Filled 2021-07-07 (×7): qty 1

## 2021-07-07 MED ORDER — FENTANYL CITRATE (PF) 100 MCG/2ML IJ SOLN
INTRAMUSCULAR | Status: AC
  Filled 2021-07-07: qty 2

## 2021-07-07 MED ORDER — DIPHENHYDRAMINE HCL 12.5 MG/5ML PO ELIX: 12.5000 mg | ORAL_SOLUTION | Freq: Four times a day (QID) | ORAL | Status: DC | PRN

## 2021-07-07 MED ORDER — ACETAMINOPHEN 500 MG PO TABS
1000.0000 mg | ORAL_TABLET | Freq: Four times a day (QID) | ORAL | Status: DC
  Administered 2021-07-07 – 2021-07-10 (×10): 1000 mg via ORAL
  Filled 2021-07-07 (×11): qty 2

## 2021-07-07 MED ORDER — ACETAMINOPHEN 500 MG PO TABS: 1000.0000 mg | ORAL_TABLET | Freq: Once | ORAL | Status: AC

## 2021-07-07 MED ORDER — PHENYLEPHRINE 40 MCG/ML (10ML) SYRINGE FOR IV PUSH (FOR BLOOD PRESSURE SUPPORT)
PREFILLED_SYRINGE | INTRAVENOUS | Status: AC
  Filled 2021-07-07: qty 10

## 2021-07-07 MED ORDER — MAGIC MOUTHWASH
15.0000 mL | Freq: Four times a day (QID) | ORAL | Status: DC | PRN
  Filled 2021-07-07: qty 15

## 2021-07-07 MED ORDER — BUPIVACAINE LIPOSOME 1.3 % IJ SUSP: 20.0000 mL | Freq: Once | INTRAMUSCULAR | Status: DC

## 2021-07-07 MED ORDER — PIPERACILLIN-TAZOBACTAM 3.375 G IVPB
3.3750 g | Freq: Three times a day (TID) | INTRAVENOUS | Status: DC
  Administered 2021-07-07 – 2021-07-08 (×2): 3.375 g via INTRAVENOUS
  Filled 2021-07-07 (×2): qty 50

## 2021-07-07 MED ORDER — DEXAMETHASONE SODIUM PHOSPHATE 10 MG/ML IJ SOLN
INTRAMUSCULAR | Status: AC
  Filled 2021-07-07: qty 1

## 2021-07-07 MED ORDER — ACETAMINOPHEN 500 MG PO TABS
ORAL_TABLET | ORAL | Status: AC
  Administered 2021-07-07: 1000 mg via ORAL
  Filled 2021-07-07: qty 2

## 2021-07-07 MED ORDER — BUPIVACAINE LIPOSOME 1.3 % IJ SUSP
INTRAMUSCULAR | Status: AC
  Filled 2021-07-07: qty 20

## 2021-07-07 MED ORDER — GABAPENTIN 300 MG PO CAPS: 300.0000 mg | ORAL_CAPSULE | ORAL | Status: AC

## 2021-07-07 MED ORDER — ALBUMIN HUMAN 5 % IV SOLN: INTRAVENOUS | Status: DC | PRN

## 2021-07-07 MED ORDER — BISACODYL 10 MG RE SUPP: 10.0000 mg | Freq: Every day | RECTAL | Status: DC | PRN

## 2021-07-07 MED ORDER — BUPIVACAINE-EPINEPHRINE 0.5% -1:200000 IJ SOLN
INTRAMUSCULAR | Status: DC | PRN
  Administered 2021-07-07: 20 mL

## 2021-07-07 MED ORDER — MAGNESIUM HYDROXIDE 400 MG/5ML PO SUSP: 30.0000 mL | Freq: Every day | ORAL | Status: DC | PRN

## 2021-07-07 MED ORDER — GABAPENTIN 300 MG PO CAPS: 300.0000 mg | ORAL_CAPSULE | ORAL | Status: DC

## 2021-07-07 MED ORDER — CHLORHEXIDINE GLUCONATE CLOTH 2 % EX PADS: 6.0000 | MEDICATED_PAD | Freq: Once | CUTANEOUS | Status: DC

## 2021-07-07 MED ORDER — MIDAZOLAM HCL 2 MG/2ML IJ SOLN
INTRAMUSCULAR | Status: DC | PRN
  Administered 2021-07-07: 2 mg via INTRAVENOUS

## 2021-07-07 MED ORDER — METOPROLOL TARTRATE 5 MG/5ML IV SOLN: 5.0000 mg | Freq: Four times a day (QID) | INTRAVENOUS | Status: DC | PRN

## 2021-07-07 MED ORDER — SIMETHICONE 80 MG PO CHEW: 40.0000 mg | CHEWABLE_TABLET | Freq: Four times a day (QID) | ORAL | Status: DC | PRN

## 2021-07-07 MED ORDER — ROCURONIUM BROMIDE 10 MG/ML (PF) SYRINGE
PREFILLED_SYRINGE | INTRAVENOUS | Status: AC
  Filled 2021-07-07: qty 10

## 2021-07-07 MED ORDER — DIBUCAINE (PERIANAL) 1 % EX OINT
TOPICAL_OINTMENT | CUTANEOUS | Status: DC | PRN
  Administered 2021-07-07: 1 via RECTAL

## 2021-07-07 MED ORDER — SODIUM CHLORIDE 0.9 % IV SOLN: 250.0000 mL | INTRAVENOUS | Status: DC | PRN

## 2021-07-07 MED ORDER — MIDAZOLAM HCL 2 MG/2ML IJ SOLN
INTRAMUSCULAR | Status: AC
  Filled 2021-07-07: qty 2

## 2021-07-07 MED ORDER — OXYCODONE HCL 5 MG PO TABS
5.0000 mg | ORAL_TABLET | ORAL | Status: DC | PRN
  Administered 2021-07-07 – 2021-07-10 (×10): 10 mg via ORAL
  Filled 2021-07-07 (×10): qty 2

## 2021-07-07 MED ORDER — ENOXAPARIN SODIUM 40 MG/0.4ML IJ SOSY
40.0000 mg | PREFILLED_SYRINGE | INTRAMUSCULAR | Status: DC
  Administered 2021-07-08 – 2021-07-10 (×3): 40 mg via SUBCUTANEOUS
  Filled 2021-07-07 (×3): qty 0.4

## 2021-07-07 MED ORDER — 0.9 % SODIUM CHLORIDE (POUR BTL) OPTIME
TOPICAL | Status: DC | PRN
  Administered 2021-07-07: 1000 mL

## 2021-07-07 MED ORDER — SODIUM CHLORIDE 0.9 % IV SOLN: 2.0000 g | INTRAVENOUS | Status: DC

## 2021-07-07 MED ORDER — ROCURONIUM BROMIDE 10 MG/ML (PF) SYRINGE
PREFILLED_SYRINGE | INTRAVENOUS | Status: DC | PRN
  Administered 2021-07-07: 80 mg via INTRAVENOUS

## 2021-07-07 MED ORDER — BUPIVACAINE LIPOSOME 1.3 % IJ SUSP
INTRAMUSCULAR | Status: DC | PRN
  Administered 2021-07-07: 20 mL

## 2021-07-07 MED ORDER — LACTATED RINGERS IV SOLN: INTRAVENOUS | Status: DC

## 2021-07-07 MED ORDER — LIDOCAINE HCL (PF) 2 % IJ SOLN
INTRAMUSCULAR | Status: AC
  Filled 2021-07-07: qty 5

## 2021-07-07 MED ORDER — SODIUM CHLORIDE 0.9 % IV SOLN: 2.0000 g | Freq: Once | INTRAVENOUS | Status: DC

## 2021-07-07 MED ORDER — SODIUM CHLORIDE 0.9% FLUSH: 3.0000 mL | INTRAVENOUS | Status: DC | PRN

## 2021-07-07 MED ORDER — ONDANSETRON HCL 4 MG/2ML IJ SOLN
INTRAMUSCULAR | Status: DC | PRN
  Administered 2021-07-07: 4 mg via INTRAVENOUS

## 2021-07-07 MED ORDER — LIP MEDEX EX OINT
1.0000 "application " | TOPICAL_OINTMENT | Freq: Two times a day (BID) | CUTANEOUS | Status: DC
  Administered 2021-07-07 – 2021-07-10 (×5): 1 via TOPICAL
  Filled 2021-07-07 (×3): qty 7

## 2021-07-07 MED ORDER — METHOCARBAMOL 500 MG PO TABS: 500.0000 mg | ORAL_TABLET | Freq: Four times a day (QID) | ORAL | Status: DC | PRN

## 2021-07-07 MED ORDER — PROPOFOL 10 MG/ML IV BOLUS
INTRAVENOUS | Status: AC
  Filled 2021-07-07: qty 20

## 2021-07-07 MED ORDER — FENTANYL CITRATE (PF) 250 MCG/5ML IJ SOLN
INTRAMUSCULAR | Status: DC | PRN
  Administered 2021-07-07: 100 ug via INTRAVENOUS
  Administered 2021-07-07 (×5): 50 ug via INTRAVENOUS

## 2021-07-07 MED ORDER — METHYLENE BLUE 0.5 % INJ SOLN
INTRAVENOUS | Status: AC
  Filled 2021-07-07: qty 10

## 2021-07-07 MED ORDER — SODIUM CHLORIDE 0.9 % IV SOLN
INTRAVENOUS | Status: AC
  Filled 2021-07-07: qty 20

## 2021-07-07 MED ORDER — PROPOFOL 10 MG/ML IV BOLUS
INTRAVENOUS | Status: DC | PRN
  Administered 2021-07-07: 150 mg via INTRAVENOUS

## 2021-07-07 MED ORDER — FENTANYL CITRATE PF 50 MCG/ML IJ SOSY
PREFILLED_SYRINGE | INTRAMUSCULAR | Status: AC
  Filled 2021-07-07: qty 3

## 2021-07-07 MED ORDER — SODIUM CHLORIDE 0.9% FLUSH
3.0000 mL | Freq: Two times a day (BID) | INTRAVENOUS | Status: DC
  Administered 2021-07-07 – 2021-07-10 (×5): 3 mL via INTRAVENOUS

## 2021-07-07 MED ORDER — CHLORHEXIDINE GLUCONATE CLOTH 2 % EX PADS
6.0000 | MEDICATED_PAD | Freq: Once | CUTANEOUS | Status: AC
  Administered 2021-07-07: 6 via TOPICAL

## 2021-07-07 MED ORDER — DIBUCAINE (PERIANAL) 1 % EX OINT
TOPICAL_OINTMENT | CUTANEOUS | Status: AC
  Filled 2021-07-07: qty 28

## 2021-07-07 MED ORDER — GABAPENTIN 300 MG PO CAPS
ORAL_CAPSULE | ORAL | Status: AC
  Administered 2021-07-07: 300 mg via ORAL
  Filled 2021-07-07: qty 1

## 2021-07-07 MED ORDER — DIPHENHYDRAMINE HCL 50 MG/ML IJ SOLN: 12.5000 mg | Freq: Four times a day (QID) | INTRAMUSCULAR | Status: DC | PRN

## 2021-07-07 MED ORDER — LIDOCAINE 2% (20 MG/ML) 5 ML SYRINGE
INTRAMUSCULAR | Status: DC | PRN
  Administered 2021-07-07: 60 mg via INTRAVENOUS

## 2021-07-07 MED ORDER — ONDANSETRON HCL 4 MG/2ML IJ SOLN
INTRAMUSCULAR | Status: AC
  Filled 2021-07-07: qty 2

## 2021-07-07 MED ORDER — METRONIDAZOLE 500 MG/100ML IV SOLN
500.0000 mg | Freq: Three times a day (TID) | INTRAVENOUS | Status: DC
  Administered 2021-07-07 – 2021-07-08 (×3): 500 mg via INTRAVENOUS
  Filled 2021-07-07 (×4): qty 100

## 2021-07-07 MED ORDER — DEXAMETHASONE SODIUM PHOSPHATE 10 MG/ML IJ SOLN
INTRAMUSCULAR | Status: DC | PRN
  Administered 2021-07-07: 10 mg via INTRAVENOUS

## 2021-07-07 MED ORDER — LACTATED RINGERS IV BOLUS: 1000.0000 mL | Freq: Three times a day (TID) | INTRAVENOUS | Status: DC | PRN

## 2021-07-07 MED ORDER — BUPIVACAINE-EPINEPHRINE (PF) 0.5% -1:200000 IJ SOLN
INTRAMUSCULAR | Status: AC
  Filled 2021-07-07: qty 30

## 2021-07-07 MED ORDER — SUGAMMADEX SODIUM 200 MG/2ML IV SOLN
INTRAVENOUS | Status: DC | PRN
  Administered 2021-07-07: 200 mg via INTRAVENOUS

## 2021-07-07 SURGICAL SUPPLY — 40 items
BAG COUNTER SPONGE SURGICOUNT (BAG) IMPLANT
BENZOIN TINCTURE PRP APPL 2/3 (GAUZE/BANDAGES/DRESSINGS) ×2 IMPLANT
BLADE SURG 15 STRL LF DISP TIS (BLADE) ×1 IMPLANT
BLADE SURG 15 STRL SS (BLADE) ×1
CNTNR URN SCR LID CUP LEK RST (MISCELLANEOUS) ×1 IMPLANT
CONT SPEC 4OZ STRL OR WHT (MISCELLANEOUS) ×1
COVER SURGICAL LIGHT HANDLE (MISCELLANEOUS) ×2 IMPLANT
DECANTER SPIKE VIAL GLASS SM (MISCELLANEOUS) ×2 IMPLANT
DRAPE LAPAROTOMY T 102X78X121 (DRAPES) ×2 IMPLANT
DRSG PAD ABDOMINAL 8X10 ST (GAUZE/BANDAGES/DRESSINGS) ×2 IMPLANT
ELECT REM PT RETURN 15FT ADLT (MISCELLANEOUS) ×2 IMPLANT
GAUZE 4X4 16PLY ~~LOC~~+RFID DBL (SPONGE) ×2 IMPLANT
GAUZE SPONGE 4X4 12PLY STRL (GAUZE/BANDAGES/DRESSINGS) ×2 IMPLANT
GLOVE SURG NEOPR MICRO LF SZ8 (GLOVE) ×2 IMPLANT
GLOVE SURG UNDER LTX SZ8 (GLOVE) ×2 IMPLANT
GOWN STRL REUS W/TWL XL LVL3 (GOWN DISPOSABLE) ×4 IMPLANT
KIT BASIN OR (CUSTOM PROCEDURE TRAY) ×2 IMPLANT
KIT TURNOVER KIT A (KITS) IMPLANT
LOOP VESSEL MAXI BLUE (MISCELLANEOUS) ×2 IMPLANT
NEEDLE HYPO 22GX1.5 SAFETY (NEEDLE) ×2 IMPLANT
PACK BASIC VI WITH GOWN DISP (CUSTOM PROCEDURE TRAY) ×2 IMPLANT
PANTS MESH DISP LRG (UNDERPADS AND DIAPERS) ×1 IMPLANT
PANTS MESH DISPOSABLE L (UNDERPADS AND DIAPERS) ×1
PENCIL SMOKE EVACUATOR (MISCELLANEOUS) IMPLANT
SHEARS HARMONIC 9CM CVD (BLADE) IMPLANT
SUCTION FRAZIER HANDLE 12FR (TUBING)
SUCTION TUBE FRAZIER 12FR DISP (TUBING) IMPLANT
SURGILUBE 2OZ TUBE FLIPTOP (MISCELLANEOUS) ×2 IMPLANT
SUT CHROMIC 2 0 SH (SUTURE) ×2 IMPLANT
SUT CHROMIC 3 0 SH 27 (SUTURE) IMPLANT
SUT VIC AB 2-0 SH 27 (SUTURE)
SUT VIC AB 2-0 SH 27X BRD (SUTURE) IMPLANT
SUT VIC AB 2-0 UR6 27 (SUTURE) ×12 IMPLANT
SWAB COLLECTION DEVICE MRSA (MISCELLANEOUS) ×2 IMPLANT
SWAB CULTURE ESWAB REG 1ML (MISCELLANEOUS) ×2 IMPLANT
SYR 20ML LL LF (SYRINGE) ×2 IMPLANT
SYR 3ML LL SCALE MARK (SYRINGE) IMPLANT
SYR BULB IRRIG 60ML STRL (SYRINGE) IMPLANT
TOWEL OR 17X26 10 PK STRL BLUE (TOWEL DISPOSABLE) ×2 IMPLANT
TOWEL OR NON WOVEN STRL DISP B (DISPOSABLE) ×2 IMPLANT

## 2021-07-07 NOTE — Anesthesia Preprocedure Evaluation (Addendum)
Anesthesia Evaluation  Patient identified by MRN, date of birth, ID band Patient awake    Reviewed: Allergy & Precautions, NPO status , Patient's Chart, lab work & pertinent test results  Airway Mallampati: I  TM Distance: >3 FB Neck ROM: Full    Dental no notable dental hx. (+) Teeth Intact, Dental Advisory Given   Pulmonary neg pulmonary ROS, Current Smoker and Patient abstained from smoking.,    Pulmonary exam normal breath sounds clear to auscultation       Cardiovascular hypertension, Normal cardiovascular exam Rhythm:Regular Rate:Normal  TTE 2022 1. Left ventricular ejection fraction, by estimation, is 65 to 70%. The  left ventricle has normal function. The left ventricle has no regional  wall motion abnormalities. There is moderate left ventricular hypertrophy.  Left ventricular diastolic  parameters were normal.  2. Right ventricular systolic function is normal. The right ventricular  size is normal. Tricuspid regurgitation signal is inadequate for assessing  PA pressure.  3. The mitral valve is normal in structure. No evidence of mitral valve  regurgitation. No evidence of mitral stenosis.  4. The aortic valve is tricuspid. Aortic valve regurgitation is not  visualized. No aortic stenosis is present.  5. The inferior vena cava is normal in size with greater than 50%  respiratory variability, suggesting right atrial pressure of 3 mmHg.    Neuro/Psych negative neurological ROS  negative psych ROS   GI/Hepatic negative GI ROS, (+)     substance abuse  alcohol use, Crohn's, perirectal fistula, perirectal abscess   Endo/Other  negative endocrine ROS  Renal/GU Renal InsufficiencyRenal diseaseLab Results      Component                Value               Date                      CREATININE               1.82 (H)            06/23/2021                BUN                      17                  06/23/2021                 NA                       134 (L)             06/23/2021                K                        4.2                 06/23/2021                CL                       108                 06/23/2021                CO2  19 (L)              06/23/2021             negative genitourinary   Musculoskeletal negative musculoskeletal ROS (+)   Abdominal   Peds  Hematology  (+) Blood dyscrasia, anemia , Lab Results      Component                Value               Date                      WBC                      9.7                 06/23/2021                HGB                      9.0 (L)             06/23/2021                HCT                      27.4 (L)            06/23/2021                MCV                      92.9                06/23/2021                PLT                      441 (H)             06/23/2021              Anesthesia Other Findings   Reproductive/Obstetrics                            Anesthesia Physical Anesthesia Plan  ASA: 3  Anesthesia Plan: General   Post-op Pain Management:    Induction: Intravenous  PONV Risk Score and Plan: 1 and Midazolam, Dexamethasone and Ondansetron  Airway Management Planned: Oral ETT  Additional Equipment:   Intra-op Plan:   Post-operative Plan: Extubation in OR  Informed Consent: I have reviewed the patients History and Physical, chart, labs and discussed the procedure including the risks, benefits and alternatives for the proposed anesthesia with the patient or authorized representative who has indicated his/her understanding and acceptance.     Dental advisory given  Plan Discussed with: CRNA  Anesthesia Plan Comments:         Anesthesia Quick Evaluation

## 2021-07-07 NOTE — Discharge Instructions (Addendum)
Your abscess has been drained.  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 Gale Journey) will order antibiotics as appropriate to help the area further heal  Call [facility] gastroenterology as soon as possible to have appointment set up to start Remicade infusions to get your Crohn's proctitis under control which caused this complicated abscess.    ##############################################  ANORECTAL SURGERY:  POST OPERATIVE INSTRUCTIONS  ######################################################################  EAT Start with a pureed / full liquid diet After 24 hours, gradually transition to a high fiber diet.    CONTROL PAIN Control pain so you can tolerate bowel movements,  walk, sleep, tolerate sneezing/coughing, and go up/down stairs.   HAVE A BOWEL MOVEMENT DAILY Keep your bowels regular to avoid problems.   Taking a fiber supplement every day to keep bowels soft.   Try a laxative to override constipation. Use an antidairrheal to slow down diarrhea.   Call if not better after 2 tries  WALK Walk an hour a day.  Control your pain to do that.   CALL IF YOU HAVE PROBLEMS/CONCERNS Call if you are still struggling despite following these instructions. Call if you have concerns not answered by these instructions  ######################################################################    Take your usually prescribed home medications unless otherwise directed.  DIET: Follow a light bland diet & liquids the first 24 hours after arrival home, such as soup, liquids, starches, etc.  Be sure to drink plenty of fluids.  Quickly advance to a usual solid diet within a few days.  Avoid fast food or heavy meals as your are more likely to get nauseated or have irregular  bowels.  A low-fat, high-fiber diet for the rest of your life is ideal.  PAIN CONTROL: Pain is best controlled by a usual combination of many methods TOGETHER: Warm baths/soaks or Ice packs Over the counter pain medication Prescription pain medications Topical creams  Expect swelling and discomfort in the anus/rectal area.  Warm water baths (30-60 minutes up to 6 times a day, especially after bowel meovements) will help. Use ice for the first few days to help decrease swelling and bruising, then switch to heat such as warm towels, sitz baths, warm baths, etc to help relax tight/sore spots and speed recovery.  Some people prefer to use ice alone, heat alone, alternating between ice & heat.  Experiment to what works for you.   It is helpful to take an over-the-counter pain medication continuously for the first few weeks.  Choose one of the following that works best for you: Naproxen (Aleve, etc)  Two 22m tabs twice a day Ibuprofen (Advil, etc) Three 2071mtabs four times a day (every meal & bedtime) Acetaminophen (Tylenol, etc) 500-65026mour times a day (every meal & bedtime) A  prescription for pain medication (such as oxycodone, hydrocodone, etc) should be given to you upon discharge.  Take your pain medication as prescribed.  If you are having problems/concerns with the prescription medicine (does not control pain, nausea, vomiting, rash, itching, etc), please call us [phone] see if we need to switch you to a different pain medicine that will work better for you and/or control your side effect better. If you need a refill on your pain medication, please contact your pharmacy.  They will contact our  office to request authorization. Prescriptions will not be filled after 5 pm or on week-ends.  If can take up to 48 hours for it to be filled & ready so avoid waiting until you are down to thel ast pill. A topical cream (Dibucaine) or a prescription for a cream (such as diltiazem 2% gel) may  be given to you.  Many people find relief with topical creams.  Some people find it burns too much.  Experiment.  If it helps, use it.  If it burns, don't using it. You also may receive a prescription for diazepam, and a muscle relaxant to help you to be able to urinate and defecate more easily.  It is safe to take a few doses with the other medications as long as you are not planning to drive or do anything intense.  Hopefully this can minimize the chance of needing a Foley catheter into your bladder   Use a Sitz Bath 4-8 times a day for relief   Sitz Bath A sitz bath is a warm water bath taken in the sitting position that covers only the hips and buttocks. It may be used for either healing or hygiene purposes. Sitz baths are also used to relieve pain, itching, or muscle spasms. The water may contain medicine. Moist heat will help you heal and relax.  HOME CARE INSTRUCTIONS  Take 3 to 4 sitz baths a day. Fill the bathtub half full with warm water. Sit in the water and open the drain a little. Turn on the warm water to keep the tub half full. Keep the water running constantly. Soak in the water for 15 to 20 minutes. After the sitz bath, pat the affected area dry first.   KEEP YOUR BOWELS REGULAR The goal is one soft bowel movement a day Avoid getting constipated.  Between the surgery and the pain medications, it is common to experience some constipation.  Increasing fluid intake and taking a fiber supplement (such as Metamucil, Citrucel, FiberCon, MiraLax, etc) 2-3 times a day regularly will usually help prevent this problem from occurring.  A mild laxative (prune juice, Milk of Magnesia, MiraLax, etc) should be taken according to package directions if there are no bowel movements after 48 hours. Watch out for diarrhea.  If you have many loose bowel movements, simplify your diet to bland foods & liquids for a few days.  Stop any stool softeners and decrease your fiber supplement.  Switching to  mild anti-diarrheal medications (Kayopectate, Pepto Bismol) can help.  Can try an imodium/loperamide dose.  If this worsens or does not improve, please call us.  Wound Care  Remove your bandages with your first bowel movement, usually the day after surgery.  Let the gauze fall off with the first bowel movement or shower.   Wear an absorbent pad or soft cotton balls in your underwear as needed to catch any drainage and help keep the area  Keep the area clean and dry.  Bathe / shower every day.  Keep the area clean by showering / bathing over the incision / wound.   It is okay to soak an open wound to help wash it.  Consider using a squeeze bottle filled with warm water to gently wash the anal area.  Wet wipes or showers / gentle washing after bowel movements is often less traumatic than regular toilet paper. You will often notice bleeding with bowel movements.  This should slow down by the end of the first week of surgery.  Sitting on an ice pack can help. Expect some drainage.  This should slow down by the end of the first week of surgery, but you will have occasional bleeding or drainage up to a few months after surgery.  Wear an absorbent pad or soft cotton gauze in your underwear until the drainage stops.  ACTIVITIES as tolerated:   You may resume regular (light) daily activities beginning the next day--such as daily self-care, walking, climbing stairs--gradually increasing activities as tolerated.  If you can walk 30 minutes without difficulty, it is safe to try more intense activity such as jogging, treadmill, bicycling, low-impact aerobics, swimming, etc. Save the most intensive and strenuous activity for last such as sit-ups, heavy lifting, contact sports, etc  Refrain from any heavy lifting or straining until you are off narcotics for pain control.   DO NOT PUSH THROUGH PAIN.  Let pain be your guide: If it hurts to do something, don't do it.  Pain is your body warning you to avoid that activity  for another week until the pain goes down. You may drive when you are no longer taking prescription pain medication, you can comfortably sit for long periods of time, and you can safely maneuver your car and apply brakes. You may have sexual intercourse when it is comfortable.  FOLLOW UP in our office Please call [facility] at [phone] to set up an appointment to see your surgeon in the office for a follow-up appointment approximately 2-3 weeks after your surgery. Make sure that you call for this appointment the day you arrive home to ensure a convenient appointment time.  8. IF YOU HAVE DISABILITY OR FAMILY LEAVE FORMS, BRING THEM TO THE OFFICE FOR PROCESSING.  DO NOT GIVE THEM TO YOUR DOCTOR.        WHEN TO CALL us [phone]: Poor pain control Reactions / problems with new medications (rash/itching, nausea, etc)  Fever over 101.5 F (38.5 C) Inability to urinate Nausea and/or vomiting Worsening swelling or bruising Continued bleeding from incision. Increased pain, redness, or drainage from the incision  The clinic staff is available to answer your questions during regular business hours (8:30am-5pm).  Please don't hesitate to call and ask to speak to one of our nurses for clinical concerns.   A surgeon from [facility] is always on call at the hospitals   If you have a medical emergency, go to the nearest emergency room or call 911.    [facility], [place], [place], [place], [place]  [address] ? MAIN: [phone] ? TOLL FREE: [phone] ? FAX [phone] [email or web address]  #####################################################
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
## 2021-07-07 NOTE — Interval H&P Note (Signed)
History and Physical Interval Note:  07/07/2021 12:56 PM  Shawn [name]  has presented today for surgery, with the diagnosis of perirectal fistula. high perirectal anal abscess, CHRON'S PROCTITIS.  The various methods of treatment have been discussed with the patient and family. After consideration of risks, benefits and other options for treatment, the patient has consented to  Procedure(s): DRAINAGE OF PELVIC ABSCESS WITH MODIFIED HANLEY PROCEDURE (N/A) PLACEMENT OF SETON (N/A) ANORECTAL EXAM UNDER ANESTHESIA (N/A) as a surgical intervention.  The patient's history has been reviewed, patient examined, no change in status, stable for surgery.  I have reviewed the patient's chart and labs.  Questions were answered to the patient's satisfaction.    I have re-reviewed the the patient's records, history, medications, and allergies.  I have re-examined the patient.  I again discussed intraoperative plans and goals of post-operative recovery.  The patient agrees to proceed.  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   Crohn's disease of small intestine ([place]) 04/29/2021   Osteomyelitis ([place]) 60/05/9322   Metabolic acidosis 55/73/2202   Abnormal CT scan, colon    Rectal pain    Perirectal abscess 12/30/2020   Perirectal cellulitis    Fistula    Renal lesion 06/04/2020   Proctitis 06/04/2020   Alcohol abuse 11/06/2019   Tobacco abuse 05/18/2013   Dilated cardiomyopathy ([place]) 05/18/2013   Abscess, perirectal, x2  05/18/2013   Chest pain at rest 11/20/2012   Hypertension 11/20/2012    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   PO B12 initiated 12/31/20   Dilated cardiomyopathy ([place]) 05/18/2013   By catheterization 2014    Hypertension     Past Surgical  History:  Procedure Laterality Date   BIOPSY  01/01/2021   Procedure: BIOPSY;  Surgeon: Ladene Artist, MD;  Location: [facility] ENDOSCOPY;  Service: Endoscopy;;   COLONOSCOPY WITH PROPOFOL N/A 01/01/2021   Procedure: COLONOSCOPY WITH PROPOFOL;  Surgeon: Ladene Artist, MD;  Location: [facility] ENDOSCOPY;  Service: Endoscopy;  Laterality: N/A;   LEFT AND RIGHT HEART CATHETERIZATION WITH CORONARY ANGIOGRAM N/A 11/21/2012   Procedure: LEFT AND RIGHT HEART CATHETERIZATION WITH CORONARY ANGIOGRAM;  Surgeon: Birdie Riddle, MD;  Location: [facility] CATH LAB;  Service: Cardiovascular;  Laterality: N/A;   MANDIBLE FRACTURE SURGERY  2006    Social History   Socioeconomic History   Marital status: Single    Spouse name: Not on file   Number of children: Not on file   Years of education: Not on file   Highest education level: Not on file  Occupational History   Not on file  Tobacco Use   Smoking status: Every Day    Packs/day: 0.25    Types: Cigarettes   Smokeless tobacco: Never  Vaping Use   Vaping Use: Never used  Substance and Sexual Activity   Alcohol use: Yes    Comment: occ   Drug use: No   Sexual activity: Not on file  Other Topics Concern   Not on file  Social History Narrative   Not on file   Social Determinants of Health   Financial Resource Strain: Not on file  Food Insecurity: Not on file  Transportation Needs: Not on file  Physical Activity: Not on file  Stress: Not on file  Social  Connections: Not on file  Intimate Partner Violence: Not on file    Family History  Problem Relation Age of Onset   Diabetes Mother    Hypertension Other     Medications Prior to Admission  Medication Sig Dispense Refill Last Dose   acetaminophen (TYLENOL) 500 MG tablet Take 2 tablets (1,000 mg total) by mouth every 6 (six) hours. (Patient taking differently: Take 1,000 mg by mouth every 6 (six) hours as needed for mild pain.) 30 tablet 0 Past Week   amoxicillin (AMOXIL) 875 MG tablet Take 1 tablet  (875 mg total) by mouth 2 (two) times daily. 60 tablet 0 07/06/2021   ibuprofen (ADVIL) 200 MG tablet Take 600 tablets by mouth daily as needed (pain).   unk   methocarbamol (ROBAXIN) 500 MG tablet Take 1,000 mg by mouth 4 (four) times daily.   07/06/2021   oxyCODONE (OXY IR/ROXICODONE) 5 MG immediate release tablet Take 5-10 mg by mouth every 6 (six) hours as needed for pain.   07/06/2021   polyethylene glycol powder (GLYCOLAX/MIRALAX) 17 GM/SCOOP powder Take 17 g by mouth daily. (Patient taking differently: Take 17 g by mouth daily as needed for mild constipation.) 3350 g 1 07/03/2021   DULoxetine (CYMBALTA) 60 MG capsule Take 1 capsule (60 mg total) by mouth daily. For tail bone pain (Patient not taking: No sig reported) 30 capsule 3 Not Taking   methocarbamol (ROBAXIN) 750 MG tablet Take 1 tablet (750 mg total) by mouth every 8 (eight) hours as needed for muscle spasms. (Patient not taking: No sig reported) 90 tablet 0 Not Taking    Current Facility-Administered Medications  Medication Dose Route Frequency Provider Last Rate Last Admin   [START ON 07/08/2021] acetaminophen (TYLENOL) tablet 1,000 mg  1,000 mg Oral On Call to OR [name], MD       bupivacaine liposome (EXPAREL) 1.3 % injection 266 mg  20 mL Infiltration Once [name], MD       bupivacaine liposome (EXPAREL) 1.3 % injection 266 mg  20 mL Infiltration Once [name], MD       cefTRIAXone (ROCEPHIN) 2 g in sodium chloride 0.9 % 100 mL IVPB  2 g Intravenous Once [name], MD       Derrill Memo ON 07/08/2021] cefTRIAXone (ROCEPHIN) 2 g in sodium chloride 0.9 % 100 mL IVPB  2 g Intravenous On Call to OR [name], MD       And   metroNIDAZOLE (FLAGYL) IVPB 500 mg  500 mg Intravenous Tor Netters, MD       Chlorhexidine Gluconate Cloth 2 % PADS 6 each  6 each Topical Once [name], MD       Derrill Memo ON 07/08/2021] feeding supplement (ENSURE PRE-SURGERY) liquid 296 mL  296 mL Oral Once [name], MD        gabapentin (NEURONTIN) capsule 300 mg  300 mg Oral 30 min Pre-Op [name], MD       Derrill Memo ON 07/08/2021] gabapentin (NEURONTIN) capsule 300 mg  300 mg Oral On Call to OR [name], MD       lactated ringers infusion   Intravenous Continuous Freddrick March, MD 75 mL/hr at 07/07/21 [place] at 07/07/21 1150   Facility-Administered Medications Ordered in Other Encounters  Medication Dose Route Frequency Provider Last Rate Last Admin   acetaminophen (TYLENOL) tablet 1,000 mg  1,000 mg Oral On Call to Candis Shine, MD       bupivacaine liposome (EXPAREL)  1.3 % injection 266 mg  20 mL Infiltration Once [name], MD       cefTRIAXone (ROCEPHIN) 2 g in dextrose 5 % 50 mL IVPB  2 g Intravenous On Call to OR [name], MD       Chlorhexidine Gluconate Cloth 2 % PADS 6 each  6 each Topical Once [name], MD       And   Chlorhexidine Gluconate Cloth 2 % PADS 6 each  6 each Topical Once [name], MD       feeding supplement (ENSURE PRE-SURGERY) liquid 296 mL  296 mL Oral Once [name], MD       gabapentin (NEURONTIN) capsule 300 mg  300 mg Oral On Call to OR [name], MD         Allergies  Allergen Reactions   Shrimp [Shellfish Allergy] Shortness Of Breath   Nsaids Other (See Comments)    CROHN'S DISEASE = NO NSAIDS    BP (!) 106/59   Pulse 73   Temp 98.5 F (36.9 C) (Oral)   Resp 20   Ht 6' 1"  (1.854 m)   Wt 89.8 kg   SpO2 100%   BMI 26.12 kg/m   Labs: No results found for this or any previous visit (from the past 48 hour(s)).  Imaging / Studies: CT ABDOMEN PELVIS W CONTRAST  Result Date: 06/23/2021 CLINICAL DATA:  Abdominal pain.  History of Crohn's disease EXAM: CT ABDOMEN AND PELVIS WITH CONTRAST TECHNIQUE: Multidetector CT imaging of the abdomen and pelvis was performed using the standard protocol following bolus administration of intravenous contrast. CONTRAST:  85m OMNIPAQUE IOHEXOL 300 MG/ML  SOLN COMPARISON:  06/04/2020 FINDINGS:  Lower chest: No acute abnormality Hepatobiliary: No focal hepatic abnormality. Gallbladder unremarkable. Pancreas: No focal abnormality or ductal dilatation. Spleen: No focal abnormality.  Normal size. Adrenals/Urinary Tract: Numerous bilateral renal cysts. No hydronephrosis. Adrenal glands and urinary bladder unremarkable. Stomach/Bowel: Normal appendix. There is wall thickening noted within the rectum, most notable posteriorly. Stranding noted in the perirectal space and presacral space. Locules of gas are seen in the presacral space adjacent to the coccyx. There is also gas in the lateral rectum which may be within rectal wall. Stomach and small bowel decompressed and unremarkable. Appendix normal. Vascular/Lymphatic: No evidence of aneurysm or adenopathy. Reproductive: No visible focal abnormality. Other: There are locules of gas noted in the presacral space near the tip of the coccyx and extending into the perineum below the coccyx. Musculoskeletal: No acute bony abnormality. IMPRESSION: Parent wall thickening within the rectum with perirectal inflammation and presacral inflammation. Gas is seen in the presacral space and adjacent to the coccyx. This may be tracking from the rectum and could reflect a perirectal abscess or fistula. Numerous bilateral renal cysts. Electronically Signed   By: KRolm BaptiseM.D.   On: 06/23/2021 18:25     .SAdin Hector M.D., F.A.C.S. Gastrointestinal and Minimally Invasive [facility] [address] [place] [phone] / Paging  07/07/2021 12:57 PM    SAdin Hector

## 2021-07-07 NOTE — Anesthesia Postprocedure Evaluation (Signed)
Anesthesia Post Note  Patient: Shawn [name]  Procedure(s) Performed: MODIFIED HANLEY PROCEDURE, DRAINAGE WITH SETON PLACEMENT (Rectum) PLACEMENT OF SETON (Rectum) ANORECTAL EXAM UNDER ANESTHESIA (Rectum)     Patient location during evaluation: PACU Anesthesia Type: General Level of consciousness: sedated and patient cooperative Pain management: pain level controlled Vital Signs Assessment: post-procedure vital signs reviewed and stable Respiratory status: spontaneous breathing Cardiovascular status: stable Anesthetic complications: no   No notable events documented.  Last Vitals:  Vitals:   07/07/21 1715 07/07/21 1730  BP: (!) 115/98 125/88  Pulse: 60 76  Resp: 11 11  Temp:  36.4 C  SpO2: 99% 98%    Last Pain:  Vitals:   07/07/21 1830  TempSrc:   PainSc: [place]

## 2021-07-07 NOTE — Anesthesia Procedure Notes (Signed)
Procedure Name: Intubation Date/Time: 07/07/2021 1:48 PM Performed by: Sharlette Dense, CRNA Pre-anesthesia Checklist: Patient identified, Emergency Drugs available, Suction available and Patient being monitored Patient Re-evaluated:Patient Re-evaluated prior to induction Oxygen Delivery Method: Circle system utilized Preoxygenation: Pre-oxygenation with 100% oxygen Induction Type: IV induction Ventilation: Mask ventilation without difficulty Laryngoscope Size: Miller and 3 Grade View: Grade I Tube type: Oral Tube size: 7.5 mm Number of attempts: 1 Airway Equipment and Method: Stylet and Oral airway Placement Confirmation: ETT inserted through vocal cords under direct vision, positive ETCO2 and breath sounds checked- equal and bilateral Secured at: 22 cm Tube secured with: Tape Dental Injury: Teeth and Oropharynx as per pre-operative assessment

## 2021-07-07 NOTE — Op Note (Signed)
07/07/2021  3:08 PM  PATIENT:  Shawn [name]  35 y.o. 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  See anesthesia record  Delay start of Pharmacological VTE agent (>24hrs) due to surgical blood loss or risk of bleeding:  no  DRAINS: none   SPECIMEN:   Abscess fluid for culture and sensitivity aerobic and anaerobic Coccyx for pathology rule out osteomyelitis  COUNTS:  YES  PLAN OF CARE: Admit for overnight observation  PATIENT DISPOSITION:  PACU - hemodynamically stable.  INDICATION: Patient with rectal pain and Crohn's proctitis.  History of prior perirectal abscesses.  Had incision and drainage earlier this year with some resolution but then recurrent pain.  Concern for high gas pockets sitting on the coccyx.  Trying to be managed with antibiotics through infectious disease.  However worsening pain.  I saw yesterday in the office.  Patient with severe pain.  I recommended urgent anorectal examination under anesthesia with probable drainage and seton  placement so he can begin immunosuppression with Remicade.  I recommended examination and surgical treatment:  The anatomy & physiology of the anorectal region was discussed.  We discussed the pathophysiology of anorectal abscess and fistula.  Differential diagnosis was discussed.  Natural history progression was discussed.   I stressed the importance of a bowel regimen to have daily soft bowel movements to minimize progression of disease.     The patient's condition is not adequately controlled.  Non-operative treatment has not healed the fistula.  Therefore, I recommended examination under anaesthesia to confirm the diagnosis and treat the fistula.  I discussed techniques that may be required such as fistulotomy, ligation by LIFT technique, and/or seton placement.  Benefits & alternatives discussed.  I noted a good likelihood this will help address the problem, but sometimes repeat operations and prolonged healing times may occur.  Risks such as bleeding, pain, recurrence, reoperation, incontinence, heart attack, death, and other risks were discussed.      Educational handouts further explaining the pathology, treatment options, and bowel regimen were given.  The patient expressed understanding & wishes to proceed.  We will work to coordinate surgery for a mutually convenient time.   OR FINDINGS: Patient had an extrasphincteric posterior midline high anal fistula with chronic abscess eroding part of the coccyx consistent with chronic coccygeal osteomyelitis.  Probable horseshoe-like extensions right and left anterolateral.  Counterincisions and setons placed for modified Hanley drainage procedure    External location NONE (chronic abscess cavity sitting at coccyx and anococcygeal region)  Internal location : Posterior midline rectum about 2.5 cm from anal verge.   POSTERIOR  ANTERIOR   DESCRIPTION:   Informed consent was confirmed. Patient underwent general anesthesia without difficulty.  Patient  was placed into prone/jacknife positioning.  The perianal region was prepped and draped in sterile fashion. Surgical timeout confirmed or plan.  I did digital rectal examination and then transitioned over to anoscopy to get a sense of the anatomy.  Upon anal canal examination was Sory retractor purulent drainage extruded from a posterior midline opening above the sphincters into the posterior rectum with and chronic proctitis.  Culture sent.  Patient had scarring left anterior greater than right anterior peritoneal region but no opening sinuses there.  Some postulation left posterior perirectal region.  However no definite fistula opening or chronic fistulous tract noted.  Went ahead with a modified Hanley procedure.  I did a vertical incision between the base of the coccyx and the sphincter complex and came through dermis and ischial rectal tissues.  Came through the anococcygeal ligament to encounter chronic abscess cavity with the coccyx and pieces floating within it.  I was able to place a fistula probe through the posterior rectal internal opening and easily connected into this chronic cavity.  Opened up so that finger could go in there.  I did a finger sweep and easily tract along the left perirectal deep ischial rectal fat to the left anterior midline and right anterior midline for chronic horseshoe like cavity.  Cannot confirm any definite fistulous opening to this region.  It did not connect with old scarring at the anterior midline.  I cannot confirm any anterior horseshoe type abscess or chronic tract.  I was able to remove pieces of coccyx x2 that were floating and easily came out.  That helped open up the abscess cavity better.  Remaining bone was well adherent so I did not do any more aggressive debridement.  I did careful needle probings along both gluteal regions and saw no deeper abscess in the gluteal region.  There was none obvious on CAT scan.  I felt like the areas of gas and fluid  that had been noted on the CAT scan earlier this month were well delineated and drained.  I created counterincisions in the right anterior and left posterior regions and used a blue vessel loop vascular tapes to connect with the posterior midline anococcygeal wound.  I created setons such that these would connect.  I create another seton around the high posterior midline rectal abscess that went into the anococcygeal region.  Picture above.  I reexamined the anal canal.   There is was no narrowing.  Rectum was friable but there was no active bleeding noted.  Abscess had been drained and there were no undrained abscess pockets.  No crepitance elsewhere.   We placed fluff gauze to onlay over the wounds.  No packing done.  Patient is extubated & in the PACU recovery room.  Called and discussed with [place] [facility].  Updated Ellouise Newer PA on the patient's status and having surgery and setons placed.  See if they can have a plan to start his Remicade/immunosuppressive regimen now that abscess is drained and setons in place to hopefully get his Crohn's colitis under control.  She will reach out to Dr. Fuller Plan who has been the attending lead gastroenterologist on this to see if they wish to intervene now or at least have close outpatient follow-up.  I discussed operative findings, updated the patient's status, discussed probable steps to recovery, and gave postoperative recommendations to the patient's significant other, [employer].  Recommendations were made.  Questions were answered.  She expressed understanding & appreciation.   Adin Hector, M.D.,  F.A.C.S. Gastrointestinal and Minimally Invasive Surgery [facility] [address] [place], [place] [address] [phone] Main / Paging

## 2021-07-07 NOTE — Transfer of Care (Signed)
Immediate Anesthesia Transfer of Care Note  Patient: Shawn [name]  Procedure(s) Performed: MODIFIED HANLEY PROCEDURE, DRAINAGE WITH SETON PLACEMENT (Rectum) PLACEMENT OF SETON (Rectum) ANORECTAL EXAM UNDER ANESTHESIA (Rectum)  Patient Location: PACU  Anesthesia Type:General  Level of Consciousness: drowsy  Airway & Oxygen Therapy: Patient Spontanous Breathing and Patient connected to face mask oxygen  Post-op Assessment: Report given to RN and Post -op Vital signs reviewed and stable  Post vital signs: Reviewed and stable  Last Vitals:  Vitals Value Taken Time  BP 138/93 07/07/21 1515  Temp    Pulse 100 07/07/21 1518  Resp 13 07/07/21 1518  SpO2 100 % 07/07/21 1518  Vitals shown include unvalidated device data.  Last Pain:  Vitals:   07/07/21 1125  TempSrc: Oral  PainSc: 10-Worst pain ever      Patients Stated Pain Goal: 4 (91/36/85 9923)  Complications: No notable events documented.

## 2021-07-08 ENCOUNTER — Encounter (HOSPITAL_COMMUNITY): Payer: Self-pay | Admitting: Surgery

## 2021-07-08 ENCOUNTER — Telehealth: Payer: Self-pay

## 2021-07-08 ENCOUNTER — Ambulatory Visit (HOSPITAL_COMMUNITY): Payer: 59

## 2021-07-08 DIAGNOSIS — D75839 Thrombocytosis, unspecified: Secondary | ICD-10-CM | POA: Diagnosis present

## 2021-07-08 DIAGNOSIS — R102 Pelvic and perineal pain: Secondary | ICD-10-CM | POA: Diagnosis present

## 2021-07-08 DIAGNOSIS — D62 Acute posthemorrhagic anemia: Secondary | ICD-10-CM | POA: Diagnosis not present

## 2021-07-08 DIAGNOSIS — K6131 Horseshoe abscess: Secondary | ICD-10-CM | POA: Diagnosis present

## 2021-07-08 DIAGNOSIS — Z20822 Contact with and (suspected) exposure to covid-19: Secondary | ICD-10-CM | POA: Diagnosis not present

## 2021-07-08 DIAGNOSIS — B9620 Unspecified Escherichia coli [E. coli] as the cause of diseases classified elsewhere: Secondary | ICD-10-CM | POA: Diagnosis present

## 2021-07-08 DIAGNOSIS — K6289 Other specified diseases of anus and rectum: Secondary | ICD-10-CM | POA: Diagnosis not present

## 2021-07-08 DIAGNOSIS — Z91013 Allergy to seafood: Secondary | ICD-10-CM | POA: Diagnosis not present

## 2021-07-08 DIAGNOSIS — I420 Dilated cardiomyopathy: Secondary | ICD-10-CM | POA: Diagnosis not present

## 2021-07-08 DIAGNOSIS — K50113 Crohn's disease of large intestine with fistula: Secondary | ICD-10-CM | POA: Diagnosis not present

## 2021-07-08 DIAGNOSIS — E872 Acidosis, unspecified: Secondary | ICD-10-CM | POA: Diagnosis not present

## 2021-07-08 DIAGNOSIS — E875 Hyperkalemia: Secondary | ICD-10-CM | POA: Diagnosis present

## 2021-07-08 DIAGNOSIS — I10 Essential (primary) hypertension: Secondary | ICD-10-CM | POA: Diagnosis not present

## 2021-07-08 DIAGNOSIS — D75838 Other thrombocytosis: Secondary | ICD-10-CM | POA: Diagnosis present

## 2021-07-08 DIAGNOSIS — Z1611 Resistance to penicillins: Secondary | ICD-10-CM | POA: Diagnosis not present

## 2021-07-08 DIAGNOSIS — F17210 Nicotine dependence, cigarettes, uncomplicated: Secondary | ICD-10-CM | POA: Diagnosis present

## 2021-07-08 DIAGNOSIS — E860 Dehydration: Secondary | ICD-10-CM | POA: Diagnosis present

## 2021-07-08 DIAGNOSIS — Z79899 Other long term (current) drug therapy: Secondary | ICD-10-CM | POA: Diagnosis not present

## 2021-07-08 DIAGNOSIS — N179 Acute kidney failure, unspecified: Secondary | ICD-10-CM | POA: Diagnosis present

## 2021-07-08 DIAGNOSIS — K5900 Constipation, unspecified: Secondary | ICD-10-CM | POA: Diagnosis present

## 2021-07-08 DIAGNOSIS — D638 Anemia in other chronic diseases classified elsewhere: Secondary | ICD-10-CM | POA: Diagnosis not present

## 2021-07-08 DIAGNOSIS — M4628 Osteomyelitis of vertebra, sacral and sacrococcygeal region: Secondary | ICD-10-CM | POA: Diagnosis not present

## 2021-07-08 DIAGNOSIS — D72829 Elevated white blood cell count, unspecified: Secondary | ICD-10-CM | POA: Diagnosis present

## 2021-07-08 DIAGNOSIS — E538 Deficiency of other specified B group vitamins: Secondary | ICD-10-CM | POA: Diagnosis present

## 2021-07-08 DIAGNOSIS — K610 Anal abscess: Secondary | ICD-10-CM | POA: Diagnosis not present

## 2021-07-08 DIAGNOSIS — R918 Other nonspecific abnormal finding of lung field: Secondary | ICD-10-CM | POA: Diagnosis present

## 2021-07-08 LAB — CBC
HCT: 24.5 % — ABNORMAL LOW (ref 39.0–52.0)
Hemoglobin: 8 g/dL — ABNORMAL LOW (ref 13.0–17.0)
MCH: 29.4 pg (ref 26.0–34.0)
MCHC: 32.7 g/dL (ref 30.0–36.0)
MCV: 90.1 fL (ref 80.0–100.0)
Platelets: 596 10*3/uL — ABNORMAL HIGH (ref 150–400)
RBC: 2.72 MIL/uL — ABNORMAL LOW (ref 4.22–5.81)
RDW: 16.4 % — ABNORMAL HIGH (ref 11.5–15.5)
WBC: 23.2 10*3/uL — ABNORMAL HIGH (ref 4.0–10.5)
nRBC: 0 % (ref 0.0–0.2)

## 2021-07-08 LAB — BASIC METABOLIC PANEL
Anion gap: 6 (ref 5–15)
Anion gap: 8 (ref 5–15)
BUN: 24 mg/dL — ABNORMAL HIGH (ref 6–20)
BUN: 22 mg/dL — ABNORMAL HIGH (ref 6–20)
CO2: 21 mmol/L — ABNORMAL LOW (ref 22–32)
CO2: 21 mmol/L — ABNORMAL LOW (ref 22–32)
Calcium: 8.3 mg/dL — ABNORMAL LOW (ref 8.9–10.3)
Calcium: 8 mg/dL — ABNORMAL LOW (ref 8.9–10.3)
Chloride: 107 mmol/L (ref 98–111)
Chloride: 108 mmol/L (ref 98–111)
Creatinine, Ser: 1.89 mg/dL — ABNORMAL HIGH (ref 0.61–1.24)
Creatinine, Ser: 1.7 mg/dL — ABNORMAL HIGH (ref 0.61–1.24)
GFR, Estimated: 46 mL/min — ABNORMAL LOW (ref >60)
GFR, Estimated: 53 mL/min — ABNORMAL LOW (ref >60)
Glucose, Bld: 159 mg/dL — ABNORMAL HIGH (ref 70–99)
Glucose, Bld: 145 mg/dL — ABNORMAL HIGH (ref 70–99)
Potassium: 6.4 mmol/L (ref 3.5–5.1)
Potassium: 5 mmol/L (ref 3.5–5.1)
Sodium: 134 mmol/L — ABNORMAL LOW (ref 135–145)
Sodium: 137 mmol/L (ref 135–145)

## 2021-07-08 LAB — MAGNESIUM: Magnesium: 2.6 mg/dL — ABNORMAL HIGH (ref 1.7–2.4)

## 2021-07-08 LAB — STREP PNEUMONIAE URINARY ANTIGEN: Strep Pneumo Urinary Antigen: NEGATIVE

## 2021-07-08 LAB — HEMOGLOBIN AND HEMATOCRIT, BLOOD
HCT: 24.3 % — ABNORMAL LOW (ref 39.0–52.0)
Hemoglobin: 8 g/dL — ABNORMAL LOW (ref 13.0–17.0)

## 2021-07-08 LAB — POTASSIUM: Potassium: 5.5 mmol/L — ABNORMAL HIGH (ref 3.5–5.1)

## 2021-07-08 IMAGING — DX DG CHEST 2V
2 series · 2 of 2 positions shown · non-contrast
Comparison: [DATE]

CLINICAL DATA: Shortness of breath with exertion

EXAM:
CHEST - 2 VIEW

[chest lat]
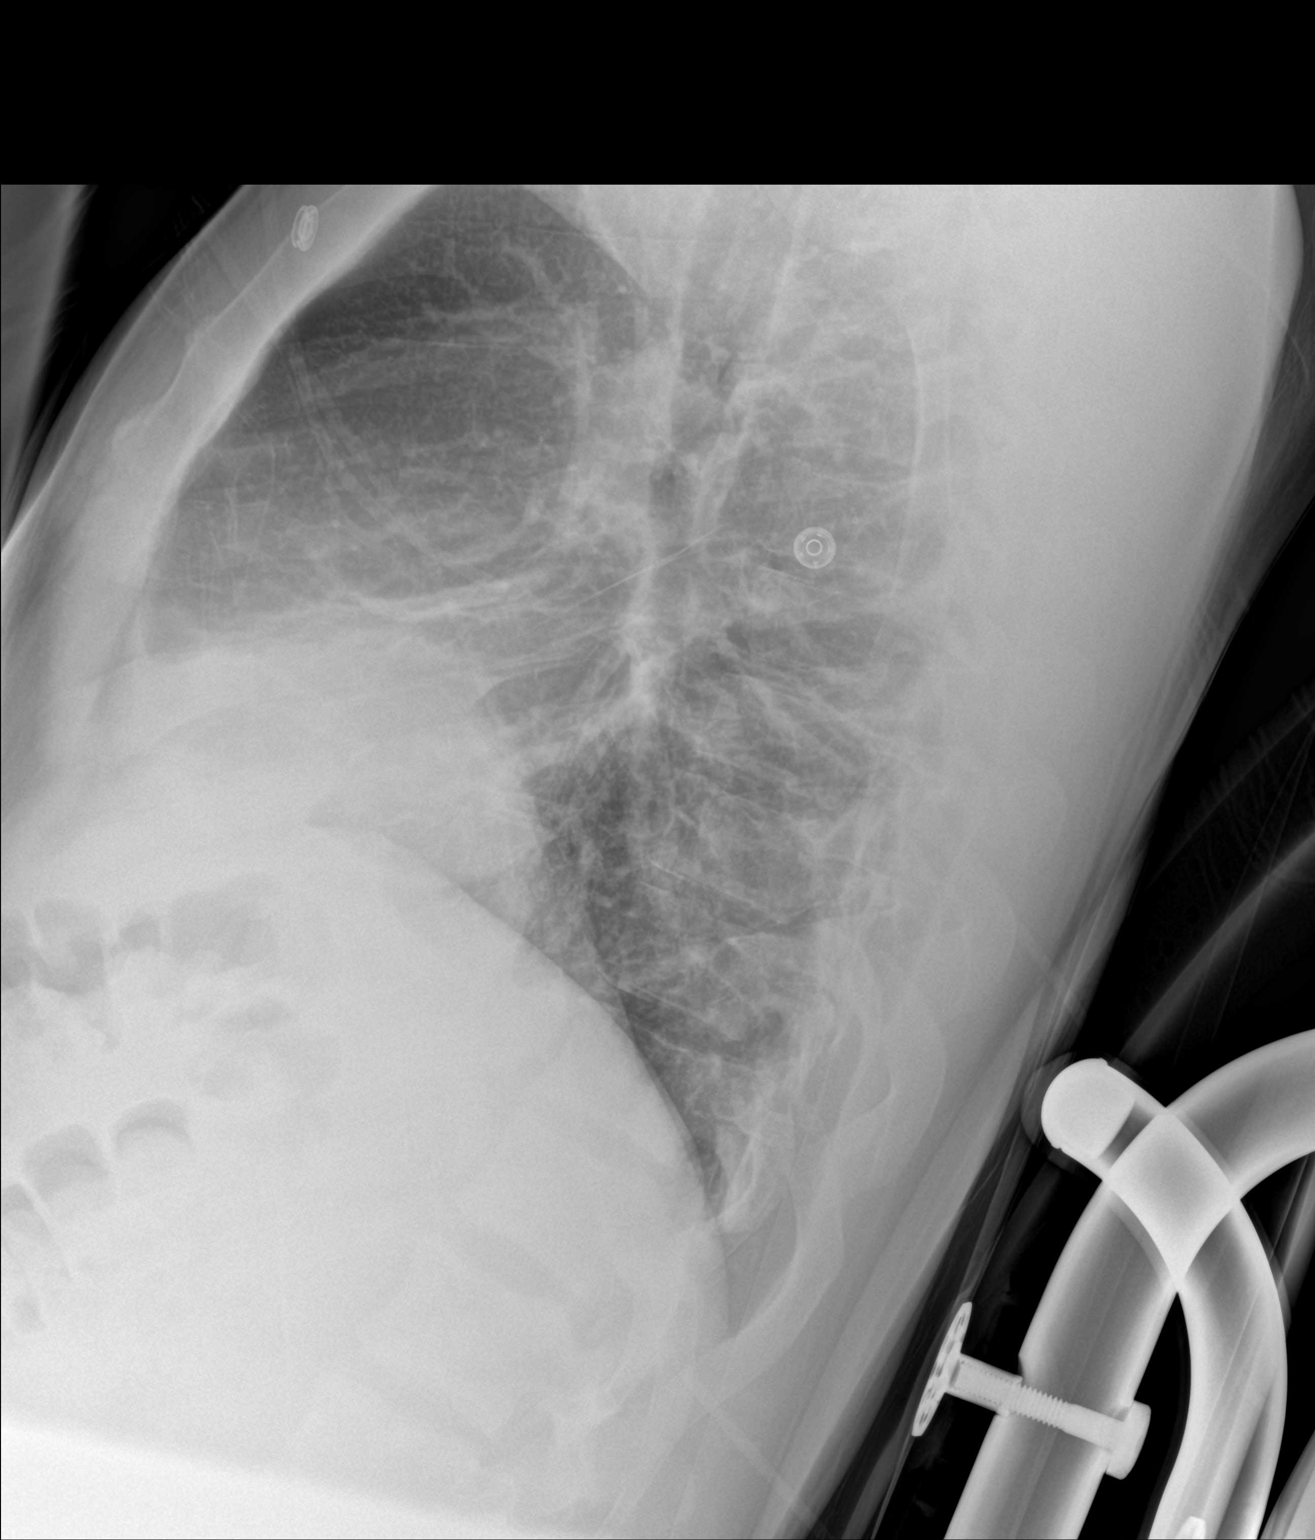

[chest ap]
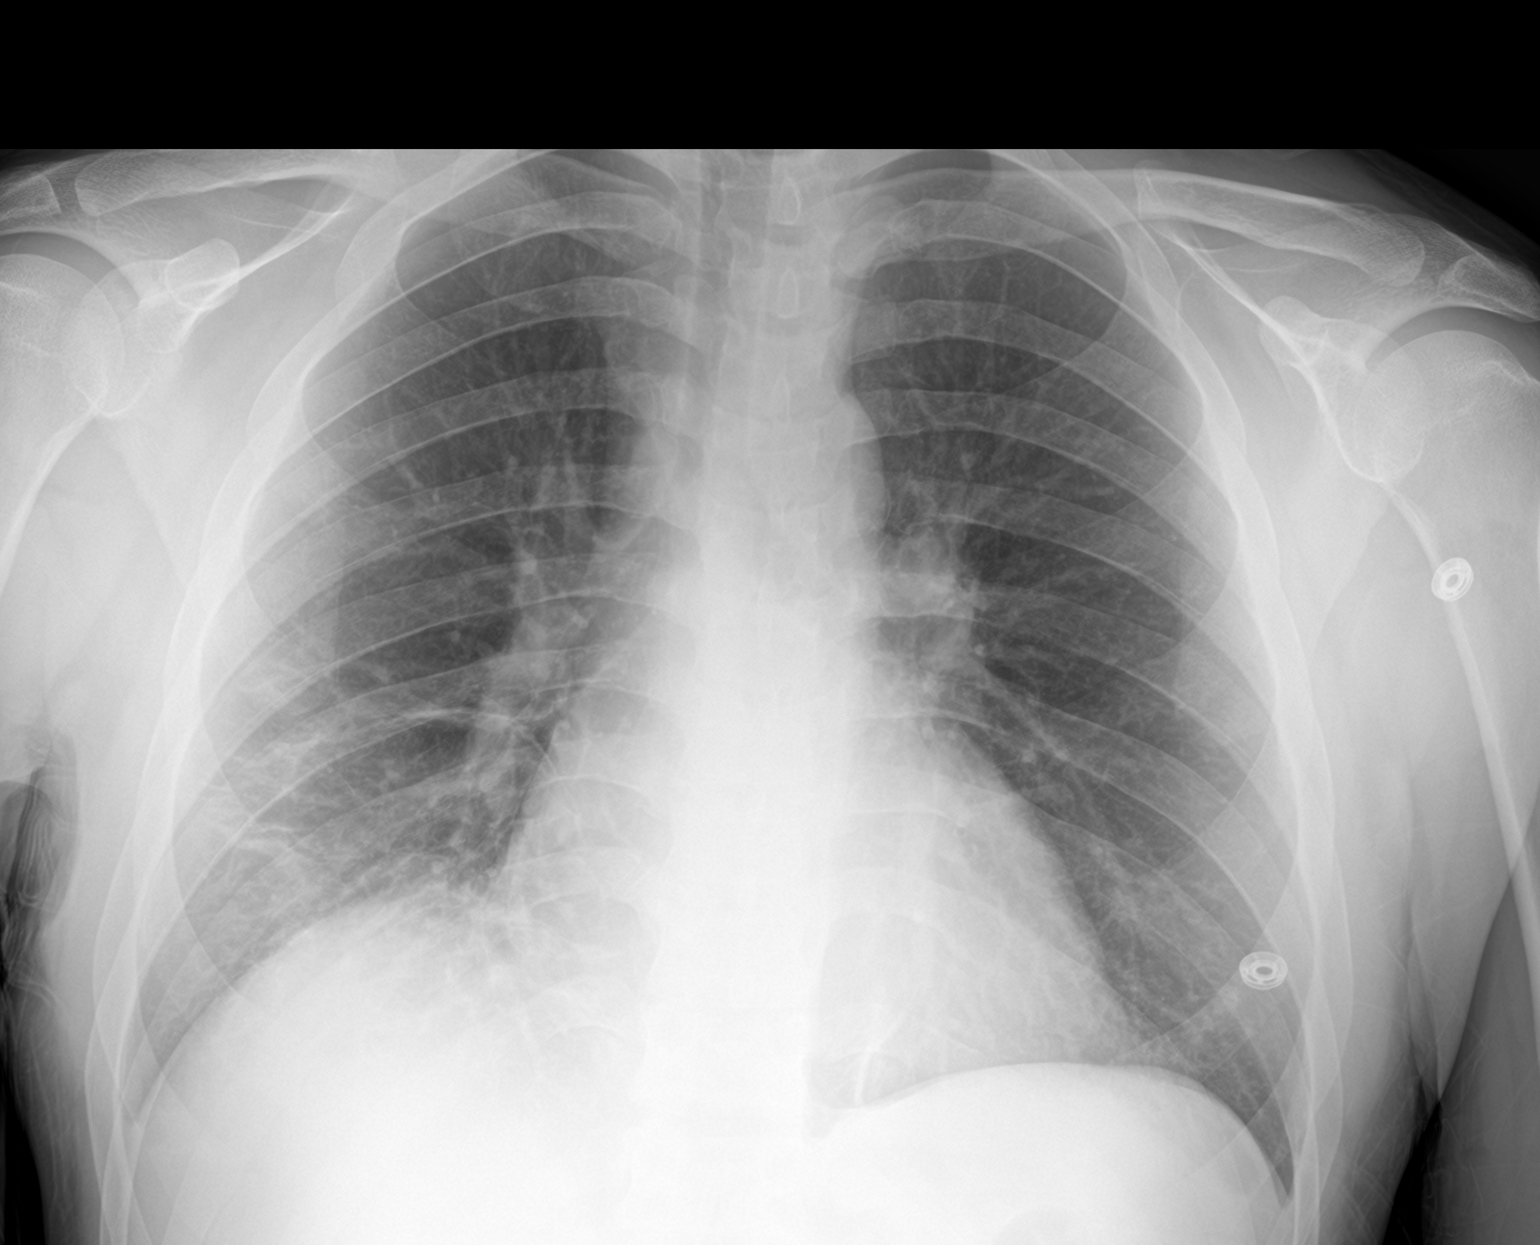

[2 of 2 positions shown; findings below may reference images not displayed]

FINDINGS: heart size is normal. Mediastinal shadows are normal. The left lung
is clear. There is patchy infiltrate/atelectasis in the right lower
lobe. No dense consolidation or lobar collapse. No effusion. Bony
structures unremarkable.
IMPRESSION: Patchy atelectasis/infiltrate in the right lower lobe.

## 2021-07-08 MED ORDER — LACTATED RINGERS IV BOLUS: 1000.0000 mL | Freq: Three times a day (TID) | INTRAVENOUS | Status: AC | PRN

## 2021-07-08 MED ORDER — AMOXICILLIN-POT CLAVULANATE 875-125 MG PO TABS
1.0000 | ORAL_TABLET | Freq: Two times a day (BID) | ORAL | Status: DC
  Administered 2021-07-08 – 2021-07-09 (×3): 1 via ORAL
  Filled 2021-07-08 (×3): qty 1

## 2021-07-08 MED ORDER — DEXTROSE 50 % IV SOLN: 1.0000 | Freq: Once | INTRAVENOUS | Status: DC

## 2021-07-08 MED ORDER — IPRATROPIUM-ALBUTEROL 0.5-2.5 (3) MG/3ML IN SOLN: 3.0000 mL | RESPIRATORY_TRACT | Status: DC | PRN

## 2021-07-08 MED ORDER — LACTATED RINGERS IV BOLUS
1000.0000 mL | Freq: Once | INTRAVENOUS | Status: AC
  Administered 2021-07-08: 1000 mL via INTRAVENOUS

## 2021-07-08 MED ORDER — INSULIN ASPART 100 UNIT/ML IV SOLN: 5.0000 [IU] | Freq: Once | INTRAVENOUS | Status: DC

## 2021-07-08 MED ORDER — MAGNESIUM HYDROXIDE 400 MG/5ML PO SUSP: 30.0000 mL | Freq: Every day | ORAL | Status: DC | PRN

## 2021-07-08 MED ORDER — LACTATED RINGERS IV SOLN: INTRAVENOUS | Status: DC

## 2021-07-08 NOTE — Progress Notes (Addendum)
Shawn [name] [id number] 04-05-1986  CARE TEAM:  PCP: Charlott Rakes, MD  Outpatient Care Team: Patient Care Team: Charlott Rakes, MD as PCP - General (Family Medicine) [name], MD as Consulting Physician (Colon and Rectal Surgery) Comer, Okey Regal, MD as Consulting Physician (Infectious Diseases) Ladene Artist, MD as Consulting Physician (Gastroenterology) Donnie Mesa, MD as Consulting Physician (General Surgery)  Inpatient Treatment Team: Treatment Team: Attending Provider: [name], MD; Physician Assistant: Levin Erp, PA; Consulting Physician: Ladene Artist, MD; Registered Nurse: Celedonio Savage, RN; Utilization Review: Tressie Stalker, RN; Social Worker: Evans Lance; Social Worker: Merri Brunette; Pharmacist: Eudelia Bunch, [facility]   Problem List:   Principal Problem:   Crohn's proctitis, with fistula [facility]) Active Problems:   Pelvic abscess in male [facility])   High anal fistula   Acute osteomyelitis of coccyx ([place])   1 Day Post-Op  07/07/2021  POST-OPERATIVE DIAGNOSIS:  HIGH ANAL FISTULA 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  I reached out to gastroenterology to see if they can help start doing Remicade or immunosuppressive regimen for his Crohn's disease.  Ellouise Newer with [facility] gastroenterology aware.  Okay to start from my standpoint since abscess drained and setons in place   Increased creatinine most likely reflection of dehydration.  We will gently rehydrate.  Hyperkalemia.  Not severely elevated on repeat.  Rehydrate and follow.  Patient has  complaints of dyspnea on exertion and intermittent chest pain.  It looks like he has had work-ups in the past with underwhelming myocardial work-up in 2014 and echocardiogram last year.  However with his persistent symptoms we will ask internal medicine to consult.  Start with chest x-ray and EKG for now.  He does not appear to be hypoxic.  Hopefully just reassurance [place].  He has some vague recollection of being told he had something that started with a "P".  I see no record of any pulmonary embolism or pericarditis or pneumonia.  Start with work-up and asked medicine to double check things while he is here  Foley out.  Urinating.  Follow urine output.  Antibiotics x5 more days.  IV Zosyn -most likely change to p.o. Augmentin..  Follow-up on cultures - GS w multiple species not surprising.  Worsening anemia.  Probably combination of some blood loss anemia on top of anemia of chronic disease.  Can consider IV iron.  See what medicine feels  -VTE prophylaxis- SCDs, etc -mobilize as tolerated to help recovery  Addendum.  I discussed with Dr. Olevia Bowens with [facility] hospitalist.  They will help evaluate patient while he is here to make sure there are no cardiopulmonary renal or other issues.  Disposition:  Disposition:  The patient is from: Home  Anticipate discharge to:  Home  Anticipated Date of Discharge is:  November 17,2022    Barriers to discharge:  Consultant clearance & sign off    Patient currently is NOT MEDICALLY STABLE for discharge from the hospital from a surgery standpoint.      25 minutes spent in review, evaluation, examination, counseling, and coordination of care.   I have reviewed this patient's available data, including  medical history, events of note, physical examination and test results as part of my evaluation.  A significant portion of that time was spent in counseling.  Care during the described time interval was provided by  me.  07/08/2021    Subjective: (Chief complaint)  Patient with pain and soreness but sitting better already.  Trying oral medications.  Needing IV meds.  Foley catheter out.  Voiding on own.  Concern for hyperkalemia  Patient has complaints of dyspnea on exertion and intermittent chest pain vague for many months.  Not necessarily worse today but bothers him and wonders if he can be worked up.  Objective:  Vital signs:  Vitals:   07/07/21 1921 07/07/21 2033 07/08/21 0140 07/08/21 0610  BP: 118/73 113/73 112/71 117/72  Pulse: 87 92 67 (!) 58  Resp: 18 18 18 18   Temp: 98.2 F (36.8 C) 98.6 F (37 C) 98.7 F (37.1 C) 98.8 F (37.1 C)  TempSrc: Oral Oral Oral Oral  SpO2: 98% 98% 99% 98%  Weight:      Height:        Last BM Date: 07/07/21  Intake/Output   Yesterday:  11/15 0701 - 11/16 0700 In: 2496.7 [P.O.:750; I.V.:1246.7; IV Piggyback:500] Out: 1700 [Urine:1650; Blood:50] This shift:  No intake/output data recorded.  Bowel function:  Flatus: YES  BM:  No  Drain: (No drain)   Physical Exam:  General: Pt awake/alert in no acute distress Eyes: PERRL, normal EOM.  Sclera clear.  No icterus Neuro: CN II-XII intact w/o focal sensory/motor deficits. Lymph: No head/neck/groin lymphadenopathy Psych:  No delerium/psychosis/paranoia.  Oriented x 4 HENT: Normocephalic, Mucus membranes moist.  No thrush Neck: Supple, No tracheal deviation.  No obvious thyromegaly Chest: No pain to chest wall compression.  Good respiratory excursion.  No audible wheezing CV:  Pulses intact.  Regular rhythm.  No major extremity edema MS: Normal AROM mjr joints.  No obvious deformity  Abdomen: Soft.  Nondistended.  Mildly tender at incisions only.  No evidence of peritonitis.  No incarcerated hernias.  Ext:   No deformity.  No mjr edema.  No cyanosis Skin: No petechiae / purpurea.  No major sores.  Warm and dry    Results:   Cultures: Recent Results (from the past 720  hour(s))  Aerobic/Anaerobic Culture w Gram Stain (surgical/deep wound)     Status: None (Preliminary result)   Collection Time: 07/07/21  3:10 PM   Specimen: Wound; Abscess  Result Value Ref Range Status   Specimen Description   Final    WOUND POSTERIOR RECTAL FISTULA ABSCESS Performed at [facility] [address], [place], [place] [address]    Special Requests   Final    NONE Performed at [facility], [place] [address], [place], [place] [address]    Gram Stain   Final    MODERATE WBC PRESENT, PREDOMINANTLY MONONUCLEAR ABUNDANT GRAM POSITIVE COCCI ABUNDANT GRAM NEGATIVE RODS MODERATE GRAM POSITIVE RODS Performed at [facility], [place] [address] [place], [place] [address]    Culture PENDING  Incomplete   Report Status PENDING  Incomplete    Labs: Results for orders placed or performed during the hospital encounter of 07/07/21 (from the past 48 hour(s))  Aerobic/Anaerobic Culture w Gram Stain (surgical/deep wound)     Status: None (Preliminary result)   Collection Time: 07/07/21  3:10 PM   Specimen: Wound; Abscess  Result Value Ref Range   Specimen Description      WOUND POSTERIOR  RECTAL FISTULA ABSCESS Performed at [facility], [place] [address] [place], [place] [address]    Special Requests      NONE Performed at [facility], [place] [address], [place], [place] [address]    Gram Stain      MODERATE WBC PRESENT, PREDOMINANTLY MONONUCLEAR ABUNDANT GRAM POSITIVE COCCI ABUNDANT GRAM NEGATIVE RODS MODERATE GRAM POSITIVE RODS Performed at [facility], [place] [address] [place], [place] [address]    Culture PENDING    Report Status PENDING   Basic metabolic panel     Status: Abnormal   Collection Time: 07/08/21  4:19 AM  Result Value Ref Range   Sodium 134 (L) 135 - 145 mmol/L   Potassium 6.4 (HH) 3.5 - 5.1 mmol/L    Comment: CRITICAL RESULT CALLED TO, READ BACK BY AND VERIFIED WITH:   EUGENIA SMITH RN 07/08/21 @ 0537 VS NO VISIBLE HEMOLYSIS    Chloride 107 98 - 111 mmol/L   CO2 21 (L) 22 - 32 mmol/L   Glucose, Bld 159 (H) 70 - 99 mg/dL    Comment: Glucose reference range applies only to samples taken after fasting for at least 8 hours.   BUN 24 (H) 6 - 20 mg/dL   Creatinine, Ser 1.89 (H) 0.61 - 1.24 mg/dL   Calcium 8.3 (L) 8.9 - 10.3 mg/dL   GFR, Estimated 46 (L) >60 mL/min    Comment: (NOTE) Calculated using the CKD-EPI Creatinine Equation (2021)    Anion gap 6 5 - 15    Comment: Performed at [facility], [place] [address], [place], [place] [address]  Magnesium     Status: Abnormal   Collection Time: 07/08/21  4:19 AM  Result Value Ref Range   Magnesium 2.6 (H) 1.7 - 2.4 mg/dL    Comment: Performed at [facility], [place] [address], [place], [place] [address]  CBC     Status: Abnormal   Collection Time: 07/08/21  4:19 AM  Result Value Ref Range   WBC 23.2 (H) 4.0 - 10.5 K/uL   RBC 2.72 (L) 4.22 - 5.81 MIL/uL   Hemoglobin 8.0 (L) 13.0 - 17.0 g/dL   HCT 24.5 (L) 39.0 - 52.0 %   MCV 90.1 80.0 - 100.0 fL   MCH 29.4 26.0 - 34.0 pg   MCHC 32.7 30.0 - 36.0 g/dL   RDW 16.4 (H) 11.5 - 15.5 %   Platelets 596 (H) 150 - 400 K/uL   nRBC 0.0 0.0 - 0.2 %    Comment: Performed at [facility], [place] [address], [place], [place] [address]  Potassium     Status: Abnormal   Collection Time: 07/08/21  6:36 AM  Result Value Ref Range   Potassium 5.5 (H) 3.5 - 5.1 mmol/L    Comment: NO VISIBLE HEMOLYSIS Performed at [facility], [place] [address] [place],  [address]     Imaging / Studies: No results found.  Medications / Allergies: per chart  Antibiotics: Anti-infectives (From admission, onward)    Start     Dose/Rate Route Frequency Ordered Stop   07/08/21 0600  cefTRIAXone (ROCEPHIN) 2 g in sodium chloride 0.9 % 100 mL IVPB  Status:  Discontinued       See Hyperspace for full Linked  Orders Report.   2 g 200 mL/hr over 30 Minutes Intravenous On call to O.R. 07/07/21 1255 07/07/21 1300   07/07/21 2200  piperacillin-tazobactam (ZOSYN) IVPB 3.375 g  3.375 g 12.5 mL/hr over 240 Minutes Intravenous Every 8 hours 07/07/21 1738 07/12/21 2159   07/07/21 1300  cefTRIAXone (ROCEPHIN) 2 g in sodium chloride 0.9 % 100 mL IVPB  Status:  Discontinued        2 g 200 mL/hr over 30 Minutes Intravenous  Once 07/07/21 1251 07/07/21 1738   07/07/21 1300  metroNIDAZOLE (FLAGYL) IVPB 500 mg       See Hyperspace for full Linked Orders Report.   500 mg 100 mL/hr over 60 Minutes Intravenous Every 8 hours 07/07/21 1255     07/07/21 1257  sodium chloride 0.9 % with cefTRIAXone (ROCEPHIN) ADS Med       Note to Pharmacy: Marchia Meiers   : cabinet override      07/07/21 1257 07/08/21 0059         Note: Portions of this report may have been transcribed using voice recognition software. Every effort was made to ensure accuracy; however, inadvertent computerized transcription errors may be present.   Any transcriptional errors that result from this process are unintentional.    Adin Hector, MD, FACS, MASCRS Esophageal, Gastrointestinal & Colorectal Surgery Robotic and Minimally Invasive Surgery  [facility], [place]  [place]. [address], [place], [place] [address] [phone] Fax [phone] Main  CONTACT INFORMATION:  Weekday (9AM-5PM): Call [facility] main office at [phone]  Weeknight (5PM-9AM) or Weekend/Holiday: Check [email or web address] (password " [email or web address]") for General Surgery [facility] coverage  (Please, do not use SecureChat as it is not reliable communication to operating surgeons for immediate patient care)      07/08/2021  8:39 AM

## 2021-07-08 NOTE — Progress Notes (Signed)
Patient has elevated potassium level of 6.4 this AM. MD on call notified, orders placed for repeat lab and treatment.

## 2021-07-08 NOTE — Consult Note (Signed)
[facility] for Infectious Disease    Date of Admission:  07/07/2021     Reason for Consult: crohn's, perirectal fistula Referring provider: [name]  Abx: 11/15-c piptazo 11/15-c metronidazole  Outpatient amox-clav        Assessment: Crohn's with perirectal fistulizing disease Probably coccyx OM  Patient is s/p I&D, modified Hanley procedure, and coccygectomy He hasn't been septic  Would continue his previous amox-clav antibiotics for another 4-6 weeks for concern of OM and perirectal deep infection now that he is s/p I&D  From id standpoint, no contraindication to start remicade within 1-2 weeks as he is currently on abx and has had I&D   Patient can discharge when ready per surgery. He has follow up appointment with dr Linus Salmons on 11/30 @ 10/30  Will follow peripherally for culture update, but no need to keep inhouse for this  Discussed with gi/surgery    I spent 60 minute reviewing data/chart, and coordinating care and >50% direct face to face time providing counseling/discussing diagnostics/treatment plan with patient     ------------------------------------------------ Principal Problem:   Crohn's proctitis, with fistula ([place]) Active Problems:   Pelvic abscess in male [facility])   High anal fistula   Acute osteomyelitis of coccyx ([place])   AKI (acute kidney injury) ([place])   Hyperkalemia   Chronic disease anemia   Hypermagnesemia   Thrombocytosis   Leukocytosis   Anemia B twelve deficiency   Right lower lobe pulmonary infiltrate    HPI: Shawn [name] is a 35 y.o. 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 There is GI plan to start remicade on him soon  He presented with perirectal abscess 01/2021, then again 03/2021 when he had some I&D and discharged on long course amox-clav. He saw gi sometimes 03/2021 and dx'ed with crohn's  He is feeling well at this time. No hx peripheral arthralgia, oral ulcer, eye pain/redness  Post op, his wbc is 23, but this admission no evidence sepsis/fever.  Labs review showed chronically elevated creatinine which appears a little worse this admission. No protein supplement/nsaids use   Family History  Problem Relation Age of Onset   Diabetes Mother    Hypertension Other     Social History   Tobacco Use   Smoking status: Every Day    Packs/day: 0.25    Types: Cigarettes   Smokeless tobacco: Never  Vaping Use   Vaping Use: Never used  Substance Use Topics   Alcohol use: Yes    Comment: occ   Drug use: No    Allergies  Allergen Reactions   Shrimp [Shellfish Allergy] Shortness Of Breath   Nsaids Other (See Comments)    CROHN'S DISEASE = NO NSAIDS    Review of Systems: ROS All Other ROS was negative, except mentioned above   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  PO B12 initiated 12/31/20   Dilated cardiomyopathy ([place]) 05/18/2013   By catheterization 2014    Hypertension        Scheduled Meds:  acetaminophen  1,000 mg Oral Q6H   insulin aspart  5 Units Intravenous Once   And   dextrose  1 ampule Intravenous Once   DULoxetine  60 mg Oral Daily   enoxaparin (LOVENOX) injection  40 mg Subcutaneous Q24H   lip balm  1 application Topical BID    polycarbophil  625 mg Oral BID   sodium chloride flush  3 mL Intravenous Q12H   Continuous Infusions:  sodium chloride     lactated ringers     lactated ringers     lactated ringers     piperacillin-tazobactam (ZOSYN)  IV 3.375 g (07/08/21 0603)   PRN Meds:.sodium chloride, bisacodyl, diphenhydrAMINE **OR** diphenhydrAMINE, ipratropium-albuterol, lactated ringers, magic mouthwash, [START ON 07/09/2021] magnesium hydroxide, methocarbamol, metoprolol tartrate, oxyCODONE, simethicone, sodium chloride flush   OBJECTIVE: Blood pressure (!) 98/51, pulse 61, temperature 97.9 F (36.6 C), temperature source Oral, resp. rate 18, height 6' 1"  (1.854 m), weight 89.8 kg, SpO2 99 %.  Physical Exam General/constitutional: no distress, pleasant HEENT: Normocephalic, PER, Conj Clear, EOMI, Oropharynx clear Neck supple CV: rrr no mrg Lungs: clear to auscultation, normal respiratory effort Abd: Soft, Nontender Ext: no edema Skin: No Rash Neuro: nonfocal Gu: reviewed picture post-op around anal -- wound drain in place MSK: no peripheral joint swelling/tenderness/warmth; back spines nontender    Lab Results Lab Results  Component Value Date   WBC 23.2 (H) 07/08/2021   HGB 8.0 (L) 07/08/2021   HCT 24.5 (L) 07/08/2021   MCV 90.1 07/08/2021   PLT 596 (H) 07/08/2021    Lab Results  Component Value Date   CREATININE 1.89 (H) 07/08/2021   BUN 24 (H) 07/08/2021   NA 134 (L) 07/08/2021   K 5.5 (H) 07/08/2021   CL 107 07/08/2021   CO2 21 (L) 07/08/2021    Lab Results  Component Value Date   ALT 18 06/23/2021   AST 15 06/23/2021   ALKPHOS 108 06/23/2021   BILITOT 0.4 06/23/2021      Microbiology: Recent Results (from the past 240 hour(s))  Aerobic/Anaerobic Culture w Gram Stain (surgical/deep wound)     Status: None (Preliminary result)   Collection Time: 07/07/21  3:10 PM   Specimen: Wound; Abscess  Result Value Ref Range Status   Specimen Description   Final    WOUND POSTERIOR  RECTAL FISTULA ABSCESS Performed at [facility] [address] [place], [place] [address]    Special Requests   Final    NONE Performed at [facility], [place] [address] [place], [place] [address]    Gram Stain   Final    MODERATE WBC PRESENT, PREDOMINANTLY MONONUCLEAR ABUNDANT GRAM POSITIVE COCCI ABUNDANT GRAM NEGATIVE RODS MODERATE GRAM POSITIVE RODS Performed at [facility], [place] [address], [place], [place] [address]    Culture Emmit [place] NEGATIVE RODS  Final   Report Status PENDING  Incomplete     Serology:    Imaging: If present, new imagings (plain films, ct scans, and mri) have been personally visualized and interpreted; radiology reports have been reviewed. Decision making incorporated into the Impression / Recommendations.    Jabier Mutton, [place] for Infectious [place] [phone] pager    07/08/2021, 12:07 PM

## 2021-07-08 NOTE — Telephone Encounter (Signed)
-----   Message from Ladene Artist, MD sent at 07/07/2021  4:45 PM EST ----- Regarding: RE: Remicade ID is involved because of possible osteomyelitis. ID is prescribing his antibiotics. I agree with starting Remicade as soon as cleared by Dr. Johney Maine and ID. GI office follow up as I recommended. Thaks.   ----- Message ----- From: Jerene Bears, MD Sent: 07/07/2021   4:20 PM EST To: Marlon Pel, RN, Ladene Artist, MD, # Subject: RE: Remicade                                   Will defer to Dr. Fuller Plan, but remicade could be started via the infusion center even before followup if cleared by dr. Johney Maine (and ID, though not sure why id is involved...Marland Kitchenosteomyelitis maybe?) JMP ----- Message ----- From: Ladene Artist, MD Sent: 07/07/2021   3:50 PM EST To: Marlon Pel, RN, Jerene Bears, MD, # Subject: RE: Remicade                                   He has a follow up appt with me on 11/17. This appt should be cancelled and rescheduled for about 4-6 weeks from now with me or an APP. We are await ID follow up.    ----- Message ----- From: Levin Erp, PA Sent: 07/07/2021   3:38 PM EST To: Marlon Pel, RN, Ladene Artist, MD, # Subject: Remicade                                       Dr. Johney Maine called about this patient. He just operated on his abscess. He just wanted to make sure that he has plan to start Remicade/follow up with [place] to make sure he didn't get lost. Michela Pitcher we don't necessarily need to officially consult while he is here in the hospital overnight, but more so just make sure there is a plan going forward.  Just wanted to get your input and maybe see where we are in the process?  That way I can let him know tomorrow morning.  FYI Dr. Hilarie Fredrickson- Dr. Johney Maine added [place] to his care team so that we could see him on the list.  Thanks team! JLL

## 2021-07-08 NOTE — Consult Note (Signed)
Medical Consultation   Shawn [name]  HKV:[id number]  DOB: 1985-12-15  DOA: 07/07/2021  PCP: Charlott Rakes, MD (Confirm with patient/family/NH records and if not entered, this has to be entered at [facility] point of entry)  Outpatient Specialists:  Lucio Edward, MD (Gastroenterology)  Scharlene Gloss, MD (Infectious diseases).  Requesting physician: Irwin Brakeman, MD General surgery.  Reason for consultation: Dyspnea. Electrolyte abnormalities.  History of Present Illness: Shawn [name] is an 35 y.o. male Crohn's disease, history of perirectal abscesses, B12 deficiency, dilated cardiomyopathy, hypertension who presented yesterday afternoon for surgery of perirectal fistula and high perirectal anal abscess.  He complained of having dyspnea overnight and his labs resulted with multiple abnormalities this morning.  He has been having dyspnea on and off recently.  He was seen at [facility] last week after he was taken via ambulance due to acute dyspnea and chest pressure.  According to the patient, he had "a scan for blood clots" (CTA chest) which did not show any acute pulmonary embolism.  However, I am unable to see the study on the care everywhere tab.  He has not had a cough, wheezing or hemoptysis.  No fever, but positive chills and occasional malaise.  No headache, sore throat, rhinorrhea, no further chest pain, palpitations, diaphoresis, PND, orthopnea or pitting edema of the lower extremities.  No nausea, emesis, diarrhea, melena or hematochezia.  He has constipation.  No dysuria, frequency hematuria.  He has urinated 3 times already today and received LR 1000 mL bolus earlier.  Review of Systems:  ROS As per HPI otherwise 10 point review of systems negative.   Past Medical History: 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   PO B12 initiated 12/31/20   Dilated cardiomyopathy ([place]) 05/18/2013    By catheterization 2014    Hypertension    Past Surgical History: Past Surgical History:  Procedure Laterality Date   BIOPSY  01/01/2021   Procedure: BIOPSY;  Surgeon: Ladene Artist, MD;  Location[facility] ENDOSCOPY;  Service: Endoscopy;;   COLONOSCOPY WITH PROPOFOL N/A 01/01/2021   Procedure: COLONOSCOPY WITH PROPOFOL;  Surgeon: Ladene Artist, MD;  Location: [facility] ENDOSCOPY;  Service: Endoscopy;  Laterality: N/A;   INCISION AND DRAINAGE ABSCESS N/A 07/07/2021   Procedure: MODIFIED HANLEY PROCEDURE, DRAINAGE WITH SETON PLACEMENT;  Surgeon: [name], MD;  Location: [facility] ORS;  Service: General;  Laterality: N/A;   LEFT AND RIGHT HEART CATHETERIZATION WITH CORONARY ANGIOGRAM N/A 11/21/2012   Procedure: LEFT AND RIGHT HEART CATHETERIZATION WITH CORONARY ANGIOGRAM;  Surgeon: Birdie Riddle, MD;  Location: [facility] CATH LAB;  Service: Cardiovascular;  Laterality: N/A;   MANDIBLE FRACTURE SURGERY  2006   PLACEMENT OF SETON N/A 07/07/2021   Procedure: PLACEMENT OF SETON;  Surgeon: [name], MD;  Location: [facility] ORS;  Service: General;  Laterality: N/A;   Allergies:   Allergies  Allergen Reactions   Shrimp [Shellfish Allergy] Shortness Of Breath   Nsaids Other (See Comments)    CROHN'S DISEASE = NO NSAIDS   Social History:  reports that he has been smoking cigarettes. He has been smoking an average of .25 packs per day. He has never used smokeless tobacco. He reports current alcohol use. He reports that he does not use drugs.  Family History: Family History  Problem Relation Age of Onset   Diabetes Mother    Hypertension Other  Physical Exam: Vitals:   07/07/21 1921 07/07/21 2033 07/08/21 0140 07/08/21 0610  BP: 118/73 113/73 112/71 117/72  Pulse: 87 92 67 (!) 58  Resp: 18 18 18 18   Temp: 98.2 F (36.8 C) 98.6 F (37 C) 98.7 F (37.1 C) 98.8 F (37.1 C)  TempSrc: Oral Oral Oral Oral  SpO2: 98% 98% 99% 98%  Weight:      Height:       Constitutional: Alert and awake, oriented  x3, not in any acute distress. Eyes: PERLA, EOMI, irises appear normal, anicteric sclera,  ENMT: external ears and nose appear normal, normal hearing.            Lips appears normal, oropharynx mucosa, tongue, posterior pharynx appear normal  Neck: neck appears normal, no masses, normal ROM, no thyromegaly, no JVD  CVS: S1-S2 clear, no murmur rubs or gallops, no LE edema, normal pedal pulses  Respiratory: Decreased breath sounds in bases, otherwise clear to auscultation bilaterally, no wheezing, rales or rhonchi. Respiratory effort normal. No accessory muscle use.  Abdomen: soft, nondistended, normal bowel sounds.  Soft, mild tenderness in the incisional area, no guarding or rebound, no hepatosplenomegaly, no hernias  Musculoskeletal: : no cyanosis, clubbing or edema noted bilaterally Neuro: Cranial nerves II-XII intact, strength, sensation, reflexes Psych: judgement and insight appear normal, stable mood and affect, mental status Skin: no rashes or lesions or ulcers, no induration or nodules   Data reviewed:  I have personally reviewed following labs and imaging studies Labs:  CBC: Recent Labs  Lab 07/08/21 0419  WBC 23.2*  HGB 8.0*  HCT 24.5*  MCV 90.1  PLT 580*   Basic Metabolic Panel: Recent Labs  Lab 07/08/21 0419 07/08/21 0636  NA 134*  --   K 6.4* 5.5*  CL 107  --   CO2 21*  --   GLUCOSE 159*  --   BUN 24*  --   CREATININE 1.89*  --   CALCIUM 8.3*  --   MG 2.6*  --    GFR Estimated Creatinine Clearance: 60.5 mL/min (A) (by C-G formula based on SCr of 1.89 mg/dL (H)). Liver Function Tests: No results for input(s): AST, ALT, ALKPHOS, BILITOT, PROT, ALBUMIN in the last 168 hours. No results for input(s): LIPASE, AMYLASE in the last 168 hours. No results for input(s): AMMONIA in the last 168 hours. Coagulation profile No results for input(s): INR, PROTIME in the last 168 hours.  Cardiac Enzymes: No results for input(s): CKTOTAL, CKMB, CKMBINDEX, TROPONINI in the  last 168 hours. BNP: Invalid input(s): POCBNP CBG: No results for input(s): GLUCAP in the last 168 hours. D-Dimer No results for input(s): DDIMER in the last 72 hours. Hgb A1c No results for input(s): HGBA1C in the last 72 hours. Lipid Profile No results for input(s): CHOL, HDL, LDLCALC, TRIG, CHOLHDL, LDLDIRECT in the last 72 hours. Thyroid function studies No results for input(s): TSH, T4TOTAL, T3FREE, THYROIDAB in the last 72 hours.  Invalid input(s): FREET3 Anemia work up No results for input(s): VITAMINB12, FOLATE, FERRITIN, TIBC, IRON, RETICCTPCT in the last 72 hours. Urinalysis    Component Value Date/Time   COLORURINE STRAW (A) 04/19/2021 0603   APPEARANCEUR CLEAR 04/19/2021 0603   LABSPEC 1.005 04/19/2021 0603   PHURINE 6.0 04/19/2021 0603   GLUCOSEU NEGATIVE 04/19/2021 0603   HGBUR NEGATIVE 04/19/2021 0603   BILIRUBINUR NEGATIVE 04/19/2021 0603   KETONESUR NEGATIVE 04/19/2021 0603   PROTEINUR NEGATIVE 04/19/2021 0603   UROBILINOGEN 1.0 01/10/2014 2044   NITRITE NEGATIVE 04/19/2021  [place] 04/19/2021 0603   Sepsis Labs Invalid input(s): PROCALCITONIN,  WBC,  LACTICIDVEN Microbiology Recent Results (from the past 240 hour(s))  Aerobic/Anaerobic Culture w Gram Stain (surgical/deep wound)     Status: None (Preliminary result)   Collection Time: 07/07/21  3:10 PM   Specimen: Wound; Abscess  Result Value Ref Range Status   Specimen Description   Final    WOUND POSTERIOR RECTAL FISTULA ABSCESS Performed at [facility] [address], [place], [place] [address]    Special Requests   Final    NONE Performed at [facility], [place] [address] [place], [place] [address]    Gram Stain   Final    MODERATE WBC PRESENT, PREDOMINANTLY MONONUCLEAR ABUNDANT GRAM POSITIVE COCCI ABUNDANT GRAM NEGATIVE RODS MODERATE GRAM POSITIVE RODS Performed at [facility], [place] [address], [place], [place] [address]     Culture PENDING  Incomplete   Report Status PENDING  Incomplete   Inpatient Medications:   Scheduled Meds:  acetaminophen  1,000 mg Oral Q6H   insulin aspart  5 Units Intravenous Once   And   dextrose  1 ampule Intravenous Once   DULoxetine  60 mg Oral Daily   enoxaparin (LOVENOX) injection  40 mg Subcutaneous Q24H   lip balm  1 application Topical BID   polycarbophil  625 mg Oral BID   sodium chloride flush  3 mL Intravenous Q12H   Continuous Infusions:  sodium chloride     sodium chloride     lactated ringers     metronidazole 500 mg (07/08/21 0549)   piperacillin-tazobactam (ZOSYN)  IV 3.375 g (07/08/21 0603)   Radiological Exams on Admission: CLINICAL DATA:  Shortness of breath with exertion EXAM: CHEST - 2 VIEW COMPARISON:  04/19/2021 FINDINGS: Heart size is normal. Mediastinal shadows are normal. The left lung is clear. There is patchy infiltrate/atelectasis in the right lower lobe. No dense consolidation or lobar collapse. No effusion. Bony structures unremarkable. IMPRESSION: Patchy atelectasis/infiltrate in the right lower lobe.   Electronically Signed   By: Nelson Chimes M.D.   On: 07/08/2021 10:48  Impression/Recommendations Principal Problem:   AKI (acute kidney injury) ([place]) Continue IV fluids. Monitor intake and output. I will avoid hypotension and nephrotoxins. Follow-up renal function electrolytes in the morning.    Hyperkalemia Continue IV fluids. Monitor potassium level.    Hypermagnesemia Continue IV fluids. Hold milk of magnesia. Follow-up renal function. Follow-up level as needed.    Active Problems:   Crohn's proctitis, with fistula ([place])   Pelvic abscess in male [facility])   High anal fistula   Acute osteomyelitis of coccyx ([place]) Postop care per surgical team. Continue IV Zosyn. Analgesics as needed.    Leukocytosis Postoperatively. LLL infiltrate on CXR. (Atelectasis vs aspiration) Continue IV antibiotics. Follow-up WBC in a.m.     Chronic disease anemia Secondary to: 1) anemia off chronic disease. 2) recent intraoperative blood loss. Monitor hematocrit and hemoglobin. Transfuse as needed.    Thrombocytosis In the setting of anemia, infection and surgical intervention. Continue to monitor platelet count daily.   Thank you for this consultation.  Our [facility] hospitalist team will follow the patient with you.  Time Spent: About 55 minutes were spent during the process of this consultation.   Reubin Milan M.D. [facility] 07/08/2021, 9:18 AM  This document was prepared using Dragon voice recognition software and may contain some unintended transcription errors.

## 2021-07-08 NOTE — Progress Notes (Signed)
Pt transferred to [place] room 1517 for tele.  Nurse called and gave report to [facility], RN.   2nd LR bolus hung prior to transfer. Pt was stable at time of transfer and went to unit in wheelchair.

## 2021-07-08 NOTE — Telephone Encounter (Signed)
Per Dr. Fuller Plan he would like to initiate Remicade infusion for Shawn [name] in about 2 weeks. Do you need anything additional from our end to initiate ?

## 2021-07-08 NOTE — Telephone Encounter (Signed)
Patient has been rescheduled to 08/05/21 11:10

## 2021-07-08 NOTE — Progress Notes (Signed)
[facility]  Passed by the patient's room this morning.  Discussed plans with him going forward.  Dr. Fuller Plan would like to give him some time after recent surgery in order to recover before seeing him.  His appointment was rescheduled from tomorrow to 08/05/2021 at 11:10.  Did discuss this with him.  Dr. Fuller Plan has also reached out to infectious disease and Dr. Johney Maine to get their opinions on starting the Remicade now versus waiting.  Dr. Johney Maine is okay with [place] starting now.  We are waiting on hearing back from infectious disease.  Discussed this all with the patient today.  Encouraged him to call our clinic if he has any questions in between now and his appointment with Dr. Fuller Plan.  The goal will be to hopefully get him started on Remicade soon.  Ellouise Newer, PA-C

## 2021-07-08 NOTE — Progress Notes (Signed)
Patient transferred from [address]. Assessment repeated once patient was on the floor. I agree with previous nurses assessment of patient. Patient alert and oriented x 4. Patient stable.

## 2021-07-09 ENCOUNTER — Other Ambulatory Visit: Payer: Self-pay | Admitting: Pharmacy Technician

## 2021-07-09 ENCOUNTER — Ambulatory Visit: Payer: 59 | Admitting: Gastroenterology

## 2021-07-09 DIAGNOSIS — D62 Acute posthemorrhagic anemia: Secondary | ICD-10-CM

## 2021-07-09 LAB — BASIC METABOLIC PANEL
Anion gap: 5 (ref 5–15)
BUN: 18 mg/dL (ref 6–20)
CO2: 22 mmol/L (ref 22–32)
Calcium: 8 mg/dL — ABNORMAL LOW (ref 8.9–10.3)
Chloride: 111 mmol/L (ref 98–111)
Creatinine, Ser: 1.53 mg/dL — ABNORMAL HIGH (ref 0.61–1.24)
GFR, Estimated: 60 mL/min — ABNORMAL LOW (ref >60)
Glucose, Bld: 93 mg/dL (ref 70–99)
Potassium: 4.7 mmol/L (ref 3.5–5.1)
Sodium: 138 mmol/L (ref 135–145)

## 2021-07-09 LAB — FOLATE: Folate: 6.4 ng/mL (ref >5.9)

## 2021-07-09 LAB — RETICULOCYTES
Immature Retic Fract: 27 % — ABNORMAL HIGH (ref 2.3–15.9)
RBC.: 2.4 MIL/uL — ABNORMAL LOW (ref 4.22–5.81)
Retic Count, Absolute: 29.5 10*3/uL (ref 19.0–186.0)
Retic Ct Pct: 1.2 % (ref 0.4–3.1)

## 2021-07-09 LAB — CBC
HCT: 21.8 % — ABNORMAL LOW (ref 39.0–52.0)
Hemoglobin: 7.2 g/dL — ABNORMAL LOW (ref 13.0–17.0)
MCH: 29.5 pg (ref 26.0–34.0)
MCHC: 33 g/dL (ref 30.0–36.0)
MCV: 89.3 fL (ref 80.0–100.0)
Platelets: 457 10*3/uL — ABNORMAL HIGH (ref 150–400)
RBC: 2.44 MIL/uL — ABNORMAL LOW (ref 4.22–5.81)
RDW: 16.3 % — ABNORMAL HIGH (ref 11.5–15.5)
WBC: 12.7 10*3/uL — ABNORMAL HIGH (ref 4.0–10.5)
nRBC: 0 % (ref 0.0–0.2)

## 2021-07-09 LAB — IRON AND TIBC
Iron: 18 ug/dL — ABNORMAL LOW (ref 45–182)
Saturation Ratios: 11 % — ABNORMAL LOW (ref 17.9–39.5)
TIBC: 157 ug/dL — ABNORMAL LOW (ref 250–450)
UIBC: 139 ug/dL

## 2021-07-09 LAB — FERRITIN: Ferritin: 193 ng/mL (ref 24–336)

## 2021-07-09 LAB — VITAMIN B12: Vitamin B-12: 56 pg/mL — ABNORMAL LOW (ref 180–914)

## 2021-07-09 LAB — SURGICAL PATHOLOGY

## 2021-07-09 MED ORDER — METHOCARBAMOL 500 MG PO TABS: 500.0000 mg | ORAL_TABLET | Freq: Four times a day (QID) | ORAL | Status: DC | PRN

## 2021-07-09 MED ORDER — CEPHALEXIN 500 MG PO CAPS: 500.0000 mg | ORAL_CAPSULE | Freq: Four times a day (QID) | ORAL | Status: DC

## 2021-07-09 MED ORDER — SODIUM CHLORIDE 0.9 % IV SOLN
500.0000 mg | Freq: Once | INTRAVENOUS | Status: AC
  Administered 2021-07-09: 12:00:00 500 mg via INTRAVENOUS
  Filled 2021-07-09: qty 10

## 2021-07-09 MED ORDER — SODIUM CHLORIDE 0.9 % IV SOLN
25.0000 mg | Freq: Once | INTRAVENOUS | Status: AC
  Administered 2021-07-09: 09:00:00 25 mg via INTRAVENOUS
  Filled 2021-07-09: qty 0.5

## 2021-07-09 MED ORDER — VITAMIN B-12 1000 MCG PO TABS
1000.0000 ug | ORAL_TABLET | Freq: Every day | ORAL | Status: DC
  Administered 2021-07-09 – 2021-07-10 (×2): 1000 ug via ORAL
  Filled 2021-07-09 (×2): qty 1

## 2021-07-09 MED ORDER — POLYETHYLENE GLYCOL 3350 17 G PO PACK
17.0000 g | PACK | Freq: Two times a day (BID) | ORAL | Status: DC | PRN
  Administered 2021-07-10: 17 g via ORAL
  Filled 2021-07-09: qty 1

## 2021-07-09 MED ORDER — POLYETHYLENE GLYCOL 3350 17 G PO PACK
34.0000 g | PACK | Freq: Once | ORAL | Status: AC
  Administered 2021-07-09: 09:00:00 34 g via ORAL
  Filled 2021-07-09: qty 2

## 2021-07-09 MED ORDER — METRONIDAZOLE 500 MG PO TABS
500.0000 mg | ORAL_TABLET | Freq: Two times a day (BID) | ORAL | Status: DC
  Administered 2021-07-09 – 2021-07-10 (×2): 500 mg via ORAL
  Filled 2021-07-09 (×2): qty 1

## 2021-07-09 NOTE — Progress Notes (Signed)
Shawn [name] [id number] Apr 23, 1986  CARE TEAM:  PCP: Charlott Rakes, MD  Outpatient Care Team: Patient Care Team: Charlott Rakes, MD as PCP - General (Family Medicine) [name], MD as Consulting Physician (Colon and Rectal Surgery) Comer, Okey Regal, MD as Consulting Physician (Infectious Diseases) Ladene Artist, MD as Consulting Physician (Gastroenterology) Donnie Mesa, MD as Consulting Physician (General Surgery)  Inpatient Treatment Team: Treatment Team: Attending Provider: [name], MD; Social Worker: Merri Brunette; Consulting Physician: Reubin Milan, MD; Registered Nurse: Chucky May, RN; Rounding Team: Ian Bushman, MD; Utilization Review: Tressie Stalker, RN; Pharmacist: Royetta Asal, [facility]   Problem List:   Principal Problem:   Crohn's proctitis, with fistula [facility]) Active Problems:   Pelvic abscess in male [facility])   High anal fistula   Acute osteomyelitis of coccyx ([place])   AKI (acute kidney injury) ([place])   Hyperkalemia   Chronic disease anemia   Hypermagnesemia   Thrombocytosis   Leukocytosis   Anemia B twelve deficiency   Right lower lobe pulmonary infiltrate   2 Days Post-Op  07/07/2021  POST-OPERATIVE DIAGNOSIS:  HIGH ANAL FISTULA WITH 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  Ellouise Newer with [facility] gastroenterology aware.  Okay to start from my standpoint since abscess drained and setons in place.   Increased  creatinine most likely reflection of dehydration.  Good urine output.  Creatinine following.  I would lean towards bed locking but defer to  Hyperkalemia resolving.  Patient has complaints of dyspnea on exertion.  Medicine has placed the patient on telemetry and move them to telemetry floor.  We will see if they can assume care if they are planning more aggressive work-up  Foley out.  Urinating.  Follow urine output.  Antibiotics per ID -I believe they switched to Augmentin orally.  At least 2 more weeks according to them.  Follow-up on cultures - GS w multiple species not surprising.  Worsening anemia.  Probably combination of some blood loss anemia on top of anemia of chronic disease.  Check anemia panel.  I ordered IV iron.    VTE prophylaxis- SCDs, etc  -mobilize as tolerated to help recovery    Disposition:  Disposition:  The patient is from: Home  Anticipate discharge to:  Home  Anticipated Date of Discharge is:  November 18,2022    Barriers to discharge:  Consultant clearance & sign off    Patient currently is NOT MEDICALLY STABLE for discharge from the hospital from a surgery standpoint.      25 minutes spent in review, evaluation, examination, counseling, and coordination of care.   I have reviewed this patient's available data, including medical history, events of note, physical examination and test results as part of my evaluation.  A significant portion of that time was spent in counseling.  Care during the described time interval was provided by me.  07/09/2021    Subjective: (Chief complaint)  Placed on telemetry and transferred per medicine.  Tolerating soft food.  Having flatus but no BM yet.  Urinating fine.  Denies any lightheaded or dizziness  Objective:  Vital signs:  Vitals:   07/08/21 1313 07/08/21 1554 07/08/21 2039 07/09/21 0503  BP: 116/66 127/81 109/75 125/69  Pulse: 67 (!) 56 85 61  Resp: 18 18 18 18   Temp: 97.8 F (36.6 C)  98.1 F (36.7 C) 98.7 F (37.1 C) 98.7 F (37.1 C)  TempSrc: Oral Oral    SpO2: 99% 100% 98% 99%  Weight:      Height:        Last BM Date: 07/07/21  Intake/Output   Yesterday:  11/16 0701 - 11/17 0700 In: 2418.2 [P.O.:240; I.V.:179.2; IV Piggyback:1999] Out: 850 [Urine:850] This shift:  No intake/output data recorded.  Bowel function:  Flatus: YES  BM:  No  Drain: (No drain)   Physical Exam:  General: Pt awake/alert in no acute distress Eyes: PERRL, normal EOM.  Sclera clear.  No icterus Neuro: CN II-XII intact w/o focal sensory/motor deficits. Lymph: No head/neck/groin lymphadenopathy Psych:  No delerium/psychosis/paranoia.  Oriented x 4 HENT: Normocephalic, Mucus membranes moist.  No thrush Neck: Supple, No tracheal deviation.  No obvious thyromegaly Chest: No pain to chest wall compression.  Good respiratory excursion.  No audible wheezing CV:  Pulses intact.  Regular rhythm.  No major extremity edema MS: Normal AROM mjr joints.  No obvious deformity Abdomen: Soft.  Nondistended.  Nontender.  No evidence of peritonitis.  No incarcerated hernias. GU:  No foley  Rectal: wounds 10/16 clean without purulence.  Tone intact  Ext:   No deformity.  No mjr edema.  No cyanosis Skin: No petechiae / purpurea.  No major sores.  Warm and dry    Results:   Cultures: Recent Results (from the past 720 hour(s))  Aerobic/Anaerobic Culture w Gram Stain (surgical/deep wound)     Status: None (Preliminary result)   Collection Time: 07/07/21  3:10 PM   Specimen: Wound; Abscess  Result Value Ref Range Status   Specimen Description   Final    WOUND POSTERIOR RECTAL FISTULA ABSCESS Performed at [place] [address], [place], [place] [address]    Special Requests   Final    NONE Performed at [facility], [place] [address] [place], [place] [address]    Gram Stain   Final    MODERATE WBC PRESENT, PREDOMINANTLY MONONUCLEAR ABUNDANT  GRAM POSITIVE COCCI ABUNDANT GRAM NEGATIVE RODS MODERATE GRAM POSITIVE RODS Performed at [facility], [place] [address], [place], [place] [address]    Culture ABUNDANT GRAM NEGATIVE RODS  Final   Report Status PENDING  Incomplete    Labs: Results for orders placed or performed during the hospital encounter of 07/07/21 (from the past 48 hour(s))  Aerobic/Anaerobic Culture w Gram Stain (surgical/deep wound)     Status: None (Preliminary result)   Collection Time: 07/07/21  3:10 PM   Specimen: Wound; Abscess  Result Value Ref Range   Specimen Description      WOUND POSTERIOR RECTAL FISTULA ABSCESS Performed at [place] [address], [place], [place] [address]    Special Requests      NONE Performed at [facility], [place] [address], [place], [place] [address]    Gram Stain      MODERATE WBC PRESENT, PREDOMINANTLY MONONUCLEAR ABUNDANT GRAM POSITIVE COCCI ABUNDANT GRAM NEGATIVE RODS MODERATE GRAM POSITIVE RODS Performed at [facility], [place] [address], [place], [place] [address]    Culture ABUNDANT GRAM NEGATIVE RODS  Report Status PENDING   Basic metabolic panel     Status: Abnormal   Collection Time: 07/08/21  4:19 AM  Result Value Ref Range   Sodium 134 (L) 135 - 145 mmol/L   Potassium 6.4 (HH) 3.5 - 5.1 mmol/L    Comment: CRITICAL RESULT CALLED TO, READ BACK BY AND VERIFIED WITH:  EUGENIA SMITH RN 07/08/21 @ 0537 VS NO VISIBLE HEMOLYSIS    Chloride 107 98 - 111 mmol/L   CO2 21 (L) 22 - 32 mmol/L   Glucose, Bld 159 (H) 70 - 99 mg/dL    Comment: Glucose reference range applies only to samples taken after fasting for at least 8 hours.   BUN 24 (H) 6 - 20 mg/dL   Creatinine, Ser 1.89 (H) 0.61 - 1.24 mg/dL   Calcium 8.3 (L) 8.9 - 10.3 mg/dL   GFR, Estimated 46 (L) >60 mL/min    Comment: (NOTE) Calculated using the CKD-EPI Creatinine Equation (2021)    Anion gap 6 5 - 15    Comment: Performed at [facility], [place] [address], [place], [place] [address]  Magnesium     Status: Abnormal   Collection Time: 07/08/21  4:19 AM  Result Value Ref Range   Magnesium 2.6 (H) 1.7 - 2.4 mg/dL    Comment: Performed at [facility], [place] [address] [place], [place] [address]  CBC     Status: Abnormal   Collection Time: 07/08/21  4:19 AM  Result Value Ref Range   WBC 23.2 (H) 4.0 - 10.5 K/uL   RBC 2.72 (L) 4.22 - 5.81 MIL/uL   Hemoglobin 8.0 (L) 13.0 - 17.0 g/dL   HCT 24.5 (L) 39.0 - 52.0 %   MCV 90.1 80.0 - 100.0 fL   MCH 29.4 26.0 - 34.0 pg   MCHC 32.7 30.0 - 36.0 g/dL   RDW 16.4 (H) 11.5 - 15.5 %   Platelets 596 (H) 150 - 400 K/uL   nRBC 0.0 0.0 - 0.2 %    Comment: Performed at [facility], [place] [address] [place], [place] [address]  Potassium     Status: Abnormal   Collection Time: 07/08/21  6:36 AM  Result Value Ref Range   Potassium 5.5 (H) 3.5 - 5.1 mmol/L    Comment: NO VISIBLE HEMOLYSIS Performed at [facility], [place] [address], [place], [place] [address]   Strep pneumoniae urinary antigen     Status: None   Collection Time: 07/08/21  1:21 PM  Result Value Ref Range   Strep Pneumo Urinary Antigen NEGATIVE NEGATIVE    Comment:        Infection due to S. pneumoniae cannot be absolutely ruled out since the antigen present may be below the detection limit of the test. Performed at [facility], [address], [place], [place] [address]   Basic metabolic panel     Status: Abnormal   Collection Time: 07/08/21  1:36 PM  Result Value Ref Range   Sodium 137 135 - 145 mmol/L   Potassium 5.0 3.5 - 5.1 mmol/L   Chloride 108 98 - 111 mmol/L   CO2 21 (L) 22 - 32 mmol/L   Glucose, Bld 145 (H) 70 - 99 mg/dL    Comment: Glucose reference range applies only to samples taken after fasting for at least 8 hours.   BUN 22 (H) 6 - 20 mg/dL   Creatinine, Ser 1.70 (H) 0.61 - 1.24 mg/dL  Calcium 8.0 (L) 8.9 - 10.3 mg/dL   GFR,  Estimated 53 (L) >60 mL/min    Comment: (NOTE) Calculated using the CKD-EPI Creatinine Equation (2021)    Anion gap 8 5 - 15    Comment: Performed at [facility], [place] [address], [place], [name] [address]  Hemoglobin and hematocrit, blood     Status: Abnormal   Collection Time: 07/08/21  1:36 PM  Result Value Ref Range   Hemoglobin 8.0 (L) 13.0 - 17.0 g/dL   HCT 24.3 (L) 39.0 - 52.0 %    Comment: Performed at [facility], [place] [address] [place], [place] [address]  Basic metabolic panel     Status: Abnormal   Collection Time: 07/09/21  4:57 AM  Result Value Ref Range   Sodium 138 135 - 145 mmol/L   Potassium 4.7 3.5 - 5.1 mmol/L   Chloride 111 98 - 111 mmol/L   CO2 22 22 - 32 mmol/L   Glucose, Bld 93 70 - 99 mg/dL    Comment: Glucose reference range applies only to samples taken after fasting for at least 8 hours.   BUN 18 6 - 20 mg/dL   Creatinine, Ser 1.53 (H) 0.61 - 1.24 mg/dL   Calcium 8.0 (L) 8.9 - 10.3 mg/dL   GFR, Estimated 60 (L) >60 mL/min    Comment: (NOTE) Calculated using the CKD-EPI Creatinine Equation (2021)    Anion gap 5 5 - 15    Comment: Performed at [facility], [place] [address] [place], [place] [address]  CBC     Status: Abnormal   Collection Time: 07/09/21  4:57 AM  Result Value Ref Range   WBC 12.7 (H) 4.0 - 10.5 K/uL   RBC 2.44 (L) 4.22 - 5.81 MIL/uL   Hemoglobin 7.2 (L) 13.0 - 17.0 g/dL   HCT 21.8 (L) 39.0 - 52.0 %   MCV 89.3 80.0 - 100.0 fL   MCH 29.5 26.0 - 34.0 pg   MCHC 33.0 30.0 - 36.0 g/dL   RDW 16.3 (H) 11.5 - 15.5 %   Platelets 457 (H) 150 - 400 K/uL   nRBC 0.0 0.0 - 0.2 %    Comment: Performed at [facility], [place] [address], [place], [place] [address]    Imaging / Studies: DG Chest 2 View  Result Date: 07/08/2021 CLINICAL DATA:  Shortness of breath with exertion EXAM: CHEST - 2 VIEW COMPARISON:  04/19/2021 FINDINGS: heart size is normal. Mediastinal  shadows are normal. The left lung is clear. There is patchy infiltrate/atelectasis in the right lower lobe. No dense consolidation or lobar collapse. No effusion. Bony structures unremarkable. IMPRESSION: Patchy atelectasis/infiltrate in the right lower lobe. Electronically Signed   By: Nelson Chimes M.D.   On: 07/08/2021 10:48    Medications / Allergies: per chart  Antibiotics: Anti-infectives (From admission, onward)    Start     Dose/Rate Route Frequency Ordered Stop   07/08/21 1345  amoxicillin-clavulanate (AUGMENTIN) 875-125 MG per tablet 1 tablet        1 tablet Oral Every 12 hours 07/08/21 1253     07/08/21 0600  cefTRIAXone (ROCEPHIN) 2 g in sodium chloride 0.9 % 100 mL IVPB  Status:  Discontinued       See Hyperspace for full Linked Orders Report.   2 g 200 mL/hr over 30 Minutes Intravenous On call to O.R. 07/07/21 1255 07/07/21 1300   07/07/21 2200  piperacillin-tazobactam (ZOSYN) IVPB 3.375 g  Status:  Discontinued        3.375 g 12.5 mL/hr over 240 Minutes Intravenous Every 8 hours 07/07/21 1738 07/08/21 1253   07/07/21 1300  cefTRIAXone (ROCEPHIN) 2 g in sodium chloride 0.9 % 100 mL IVPB  Status:  Discontinued        2 g 200 mL/hr over 30 Minutes Intravenous  Once 07/07/21 1251 07/07/21 1738   07/07/21 1300  metroNIDAZOLE (FLAGYL) IVPB 500 mg  Status:  Discontinued       See Hyperspace for full Linked Orders Report.   500 mg 100 mL/hr over 60 Minutes Intravenous Every 8 hours 07/07/21 1255 07/08/21 1025   07/07/21 1257  sodium chloride 0.9 % with cefTRIAXone (ROCEPHIN) ADS Med       Note to Pharmacy: Marchia Meiers   : cabinet override      07/07/21 1257 07/08/21 0059         Note: Portions of this report may have been transcribed using voice recognition software. Every effort was made to ensure accuracy; however, inadvertent computerized transcription errors may be present.   Any transcriptional errors that result from this process are unintentional.    Adin Hector, MD, FACS, MASCRS Esophageal, Gastrointestinal & Colorectal Surgery Robotic and Minimally Invasive Surgery  [facility], [place]  [place]. [address], [place], [place] [address] [phone] Fax [phone] Main  CONTACT INFORMATION:  Weekday (9AM-5PM): Call [facility] main office at [phone]  Weeknight (5PM-9AM) or Weekend/Holiday: Check [email or web address] (password " [email or web address]") for General Surgery [facility] coverage  (Please, do not use SecureChat as it is not reliable communication to operating surgeons for immediate patient care)      07/09/2021  7:23 AM

## 2021-07-09 NOTE — Telephone Encounter (Signed)
Sheri, There is nothing else needed, his insurance has approved treatment.  We will have him scheduled in 2wks. Thanks Maudie Mercury

## 2021-07-09 NOTE — Progress Notes (Signed)
PROGRESS NOTE    Shawn [name]  HYW:[id number] DOB: 1986/04/04 DOA: 07/07/2021 PCP: Charlott Rakes, MD    Brief Narrative:  35 y.o. male Crohn's disease, history of perirectal abscesses, B12 deficiency, dilated cardiomyopathy, hypertension who presented yesterday afternoon for surgery of perirectal fistula and high perirectal anal abscess.  He complained of having dyspnea overnight and his labs resulted with multiple abnormalities this morning.  He has been having dyspnea on and off recently.  He was seen at [facility] last week after he was taken via ambulance due to acute dyspnea and chest pressure.  According to the patient, he had "a scan for blood clots" (CTA chest) which did not show any acute pulmonary embolism.  However, I am unable to see the study on the care everywhere tab.  He has not had a cough, wheezing or hemoptysis.  No fever, but positive chills and occasional malaise.  No headache, sore throat, rhinorrhea, no further chest pain, palpitations, diaphoresis, PND, orthopnea or pitting edema of the lower extremities.  No nausea, emesis, diarrhea, melena or hematochezia.  He has constipation.  No dysuria, frequency hematuria.  Assessment & Plan:   Principal Problem:   Crohn's proctitis, with fistula ([place]) Active Problems:   Pelvic abscess in male [facility])   High anal fistula   Acute osteomyelitis of coccyx (HCC)   AKI (acute kidney injury) ([place])   Hyperkalemia   Chronic disease anemia   Hypermagnesemia   Thrombocytosis   Leukocytosis   Anemia B twelve deficiency   Right lower lobe pulmonary infiltrate   Acute blood loss anemia (ABLA)  Principal Problem:   AKI (acute kidney injury) ([place]) Continue to monitor intake and output. Cont to avoid hypotension and nephrotoxins. Renal function improving with IVF hydration     Hyperkalemia Tolerated IVF Potassium normalized Cont to follow lytes     Hypermagnesemia Given IVF Cont to hold milk of magnesia.    Active  Problems:   Crohn's proctitis, with fistula ([place])   Pelvic abscess in male [facility])   High anal fistula   Acute osteomyelitis of coccyx ([place]) Postop care per surgical team. Continue IV Zosyn. Analgesics as needed.     Leukocytosis Postoperatively. LLL infiltrate on CXR. (Atelectasis vs aspiration) Continue IV antibiotics as per ID recs WBC has improved to 12k today     Chronic disease anemia Secondary to: 1) anemia off chronic disease. 2) recent intraoperative blood loss. Hgb 7.2 this AM Pt seen ambulating briskly in hallway without issues or sob     Thrombocytosis In the setting of anemia, infection and surgical intervention. Plt count trending towards normal  Dyspnea -Resolved -Recent chest imaging with infiltrate/atelectasis in LLL. On abx per above -Pt currently denies any dyspnea and was observed walking briskly in hallway on room air -O2 sats currently 100% on RA  Vit B12 Deficiency -B12 level of 56 -Have ordered B12 replacement   DVT prophylaxis: Lovenox subq Code Status: Full Family Communication: Pt in room, family not at bedside   Antimicrobials: Anti-infectives (From admission, onward)    Start     Dose/Rate Route Frequency Ordered Stop   07/08/21 1345  amoxicillin-clavulanate (AUGMENTIN) 875-125 MG per tablet 1 tablet        1 tablet Oral Every 12 hours 07/08/21 1253     07/08/21 0600  cefTRIAXone (ROCEPHIN) 2 g in sodium chloride 0.9 % 100 mL IVPB  Status:  Discontinued       See Hyperspace for full Linked Orders Report.   2 g 200  mL/hr over 30 Minutes Intravenous On call to O.R. 07/07/21 1255 07/07/21 1300   07/07/21 2200  piperacillin-tazobactam (ZOSYN) IVPB 3.375 g  Status:  Discontinued        3.375 g 12.5 mL/hr over 240 Minutes Intravenous Every 8 hours 07/07/21 1738 07/08/21 1253   07/07/21 1300  cefTRIAXone (ROCEPHIN) 2 g in sodium chloride 0.9 % 100 mL IVPB  Status:  Discontinued        2 g 200 mL/hr over 30 Minutes Intravenous  Once 07/07/21  1251 07/07/21 1738   07/07/21 1300  metroNIDAZOLE (FLAGYL) IVPB 500 mg  Status:  Discontinued       See Hyperspace for full Linked Orders Report.   500 mg 100 mL/hr over 60 Minutes Intravenous Every 8 hours 07/07/21 1255 07/08/21 1025   07/07/21 1257  sodium chloride 0.9 % with cefTRIAXone (ROCEPHIN) ADS Med       Note to Pharmacy: Marchia Meiers   : cabinet override      07/07/21 1257 07/08/21 0059       Subjective: Hoping to go home soon  Objective: Vitals:   07/08/21 1554 07/08/21 2039 07/09/21 0503 07/09/21 0855  BP: 127/81 109/75 125/69 137/78  Pulse: (!) 56 85 61 77  Resp: 18 18 18 19   Temp: 98.1 F (36.7 C) 98.7 F (37.1 C) 98.7 F (37.1 C) 98.5 F (36.9 C)  TempSrc: Oral   Oral  SpO2: 100% 98% 99% 100%  Weight:      Height:        Intake/Output Summary (Last 24 hours) at 07/09/2021 1739 Last data filed at 07/09/2021 1500 Gross per 24 hour  Intake 267.44 ml  Output 800 ml  Net -532.56 ml   Filed Weights   07/07/21 1125  Weight: 89.8 kg    Examination: General exam: Awake, laying in bed, in nad Respiratory system: Normal respiratory effort, no wheezing Cardiovascular system: regular rate, s1, s2 Gastrointestinal system: Soft, nondistended, positive BS Central nervous system: CN2-12 grossly intact, strength intact Extremities: Perfused, no clubbing Skin: Normal skin turgor, no notable skin lesions seen Psychiatry: Mood normal // no visual hallucinations   Data Reviewed: I have personally reviewed following labs and imaging studies  CBC: Recent Labs  Lab 07/08/21 0419 07/08/21 1336 07/09/21 0457  WBC 23.2*  --  12.7*  HGB 8.0* 8.0* 7.2*  HCT 24.5* 24.3* 21.8*  MCV 90.1  --  89.3  PLT 596*  --  025*   Basic Metabolic Panel: Recent Labs  Lab 07/08/21 0419 07/08/21 0636 07/08/21 1336 07/09/21 0457  NA 134*  --  137 138  K 6.4* 5.5* 5.0 4.7  CL 107  --  108 111  CO2 21*  --  21* 22  GLUCOSE 159*  --  145* 93  BUN 24*  --  22* 18   CREATININE 1.89*  --  1.70* 1.53*  CALCIUM 8.3*  --  8.0* 8.0*  MG 2.6*  --   --   --    GFR: Estimated Creatinine Clearance: 74.7 mL/min (A) (by C-G formula based on SCr of 1.53 mg/dL (H)). Liver Function Tests: No results for input(s): AST, ALT, ALKPHOS, BILITOT, PROT, ALBUMIN in the last 168 hours. No results for input(s): LIPASE, AMYLASE in the last 168 hours. No results for input(s): AMMONIA in the last 168 hours. Coagulation Profile: No results for input(s): INR, PROTIME in the last 168 hours. Cardiac Enzymes: No results for input(s): CKTOTAL, CKMB, CKMBINDEX, TROPONINI in the last 168 hours. BNP (  last 3 results) No results for input(s): PROBNP in the last 8760 hours. HbA1C: No results for input(s): HGBA1C in the last 72 hours. CBG: No results for input(s): GLUCAP in the last 168 hours. Lipid Profile: No results for input(s): CHOL, HDL, LDLCALC, TRIG, CHOLHDL, LDLDIRECT in the last 72 hours. Thyroid Function Tests: No results for input(s): TSH, T4TOTAL, FREET4, T3FREE, THYROIDAB in the last 72 hours. Anemia Panel: Recent Labs    07/08/21 0635 07/09/21 0450  VITAMINB12 56*  --   FOLATE 6.4  --   FERRITIN 193  --   TIBC 157*  --   IRON 18*  --   RETICCTPCT  --  1.2   Sepsis Labs: No results for input(s): PROCALCITON, LATICACIDVEN in the last 168 hours.  Recent Results (from the past 240 hour(s))  Aerobic/Anaerobic Culture w Gram Stain (surgical/deep wound)     Status: None (Preliminary result)   Collection Time: 07/07/21  3:10 PM   Specimen: Wound; Abscess  Result Value Ref Range Status   Specimen Description   Final    WOUND POSTERIOR RECTAL FISTULA ABSCESS Performed at [facility] [address], [place], [place] [address]    Special Requests   Final    NONE Performed at [facility], [place] [address], [place],  [address]    Gram Stain   Final    MODERATE WBC PRESENT, PREDOMINANTLY MONONUCLEAR ABUNDANT GRAM  POSITIVE COCCI ABUNDANT GRAM NEGATIVE RODS MODERATE GRAM POSITIVE RODS    Culture   Final    ABUNDANT ESCHERICHIA COLI CULTURE REINCUBATED FOR BETTER GROWTH Performed at [facility], [place] [address], [place],  [address]    Report Status PENDING  Incomplete   Organism ID, Bacteria ESCHERICHIA COLI  Final      Susceptibility   Escherichia coli - MIC*    AMPICILLIN >=32 RESISTANT Resistant     CEFAZOLIN 8 SENSITIVE Sensitive     CEFEPIME <=0.12 SENSITIVE Sensitive     CEFTAZIDIME <=1 SENSITIVE Sensitive     CEFTRIAXONE <=0.25 SENSITIVE Sensitive     CIPROFLOXACIN <=0.25 SENSITIVE Sensitive     GENTAMICIN <=1 SENSITIVE Sensitive     IMIPENEM <=0.25 SENSITIVE Sensitive     TRIMETH/SULFA <=20 SENSITIVE Sensitive     AMPICILLIN/SULBACTAM >=32 RESISTANT Resistant     PIP/TAZO <=4 SENSITIVE Sensitive     * ABUNDANT ESCHERICHIA COLI     Radiology Studies: DG Chest 2 View  Result Date: 07/08/2021 CLINICAL DATA:  Shortness of breath with exertion EXAM: CHEST - 2 VIEW COMPARISON:  04/19/2021 FINDINGS: heart size is normal. Mediastinal shadows are normal. The left lung is clear. There is patchy infiltrate/atelectasis in the right lower lobe. No dense consolidation or lobar collapse. No effusion. Bony structures unremarkable. IMPRESSION: Patchy atelectasis/infiltrate in the right lower lobe. Electronically Signed   By: Nelson Chimes M.D.   On: 07/08/2021 10:48    Scheduled Meds:  acetaminophen  1,000 mg Oral Q6H   amoxicillin-clavulanate  1 tablet Oral Q12H   insulin aspart  5 Units Intravenous Once   And   dextrose  1 ampule Intravenous Once   DULoxetine  60 mg Oral Daily   enoxaparin (LOVENOX) injection  40 mg Subcutaneous Q24H   lip balm  1 application Topical BID   polycarbophil  625 mg Oral BID   sodium chloride flush  3 mL Intravenous Q12H   Continuous Infusions:  sodium chloride     lactated ringers     lactated  ringers 125 mL/hr at 07/09/21 1653     LOS: 1 day    Marylu Lund, MD [facility] Pager On Amion  If 7PM-7AM, please contact night-coverage 07/09/2021, 5:39 PM

## 2021-07-10 DIAGNOSIS — D509 Iron deficiency anemia, unspecified: Secondary | ICD-10-CM

## 2021-07-10 DIAGNOSIS — E538 Deficiency of other specified B group vitamins: Secondary | ICD-10-CM

## 2021-07-10 LAB — COMPREHENSIVE METABOLIC PANEL
ALT: 26 U/L (ref 0–44)
AST: 18 U/L (ref 15–41)
Albumin: 2.9 g/dL — ABNORMAL LOW (ref 3.5–5.0)
Alkaline Phosphatase: 77 U/L (ref 38–126)
Anion gap: 10 (ref 5–15)
BUN: 15 mg/dL (ref 6–20)
CO2: 22 mmol/L (ref 22–32)
Calcium: 8.7 mg/dL — ABNORMAL LOW (ref 8.9–10.3)
Chloride: 107 mmol/L (ref 98–111)
Creatinine, Ser: 1.54 mg/dL — ABNORMAL HIGH (ref 0.61–1.24)
GFR, Estimated: 59 mL/min — ABNORMAL LOW (ref >60)
Glucose, Bld: 85 mg/dL (ref 70–99)
Potassium: 4.9 mmol/L (ref 3.5–5.1)
Sodium: 139 mmol/L (ref 135–145)
Total Bilirubin: 0.4 mg/dL (ref 0.3–1.2)
Total Protein: 6.9 g/dL (ref 6.5–8.1)

## 2021-07-10 LAB — CBC
HCT: 23 % — ABNORMAL LOW (ref 39.0–52.0)
Hemoglobin: 7.5 g/dL — ABNORMAL LOW (ref 13.0–17.0)
MCH: 29.1 pg (ref 26.0–34.0)
MCHC: 32.6 g/dL (ref 30.0–36.0)
MCV: 89.1 fL (ref 80.0–100.0)
Platelets: 495 10*3/uL — ABNORMAL HIGH (ref 150–400)
RBC: 2.58 MIL/uL — ABNORMAL LOW (ref 4.22–5.81)
RDW: 16.3 % — ABNORMAL HIGH (ref 11.5–15.5)
WBC: 9.9 10*3/uL (ref 4.0–10.5)
nRBC: 0 % (ref 0.0–0.2)

## 2021-07-10 MED ORDER — CEFDINIR 300 MG PO CAPS
300.0000 mg | ORAL_CAPSULE | Freq: Two times a day (BID) | ORAL | Status: DC
  Administered 2021-07-10: 300 mg via ORAL
  Filled 2021-07-10 (×2): qty 1

## 2021-07-10 MED ORDER — CYANOCOBALAMIN 1000 MCG PO TABS: 1000.0000 ug | ORAL_TABLET | Freq: Every day | ORAL | 2 refills | Status: DC

## 2021-07-10 MED ORDER — SODIUM CHLORIDE 0.9 % IV SOLN: 250.0000 mL | INTRAVENOUS | Status: DC | PRN

## 2021-07-10 MED ORDER — METRONIDAZOLE 500 MG PO TABS: 500.0000 mg | ORAL_TABLET | Freq: Two times a day (BID) | ORAL | 0 refills | Status: DC

## 2021-07-10 MED ORDER — SODIUM CHLORIDE 0.9% FLUSH
3.0000 mL | Freq: Two times a day (BID) | INTRAVENOUS | Status: DC
  Administered 2021-07-10: 3 mL via INTRAVENOUS

## 2021-07-10 MED ORDER — SODIUM CHLORIDE 0.9% FLUSH: 3.0000 mL | INTRAVENOUS | Status: DC | PRN

## 2021-07-10 MED ORDER — CENTRUM PO CHEW: 1.0000 | CHEWABLE_TABLET | Freq: Every day | ORAL | 2 refills | Status: DC

## 2021-07-10 MED ORDER — CEFDINIR 300 MG PO CAPS: 300.0000 mg | ORAL_CAPSULE | Freq: Two times a day (BID) | ORAL | 0 refills | Status: DC

## 2021-07-10 NOTE — Progress Notes (Signed)
PROGRESS NOTE    Shawn [name]  YJE:[id number] DOB: 12-07-1985 DOA: 07/07/2021 PCP: Charlott Rakes, MD    Brief Narrative:  35 y.o. male Crohn's disease, history of perirectal abscesses, B12 deficiency, dilated cardiomyopathy, hypertension who presented yesterday afternoon for surgery of perirectal fistula and high perirectal anal abscess.  He complained of having dyspnea overnight and his labs resulted with multiple abnormalities this morning.  He has been having dyspnea on and off recently.  He was seen at [facility] last week after he was taken via ambulance due to acute dyspnea and chest pressure.  According to the patient, he had "a scan for blood clots" (CTA chest) which did not show any acute pulmonary embolism.  However, I am unable to see the study on the care everywhere tab.  He has not had a cough, wheezing or hemoptysis.  No fever, but positive chills and occasional malaise.  No headache, sore throat, rhinorrhea, no further chest pain, palpitations, diaphoresis, PND, orthopnea or pitting edema of the lower extremities.  No nausea, emesis, diarrhea, melena or hematochezia.  He has constipation.  No dysuria, frequency hematuria.  Assessment & Plan:   Principal Problem:   Crohn's proctitis, with fistula ([place]) Active Problems:   Pelvic abscess in male [facility])   High anal fistula   Acute osteomyelitis of coccyx (HCC)   AKI (acute kidney injury) ([place])   Hyperkalemia   Chronic disease anemia   Hypermagnesemia   Thrombocytosis   Leukocytosis   Right lower lobe pulmonary infiltrate   Acute blood loss anemia (ABLA)   Vitamin B12 deficiency   IDA (iron deficiency anemia)  Principal Problem:   AKI (acute kidney injury) ([place]) Cont to avoid hypotension and nephrotoxins. Renal function has steadily improved Good urine output overnight Recommend repeat bmet in 1-2 weeks with PCP     Hyperkalemia Tolerated IVF Potassium normalized     Hypermagnesemia Given IVF Cont to hold  milk of magnesia.    Active Problems:   Crohn's proctitis, with fistula ([place])   Pelvic abscess in male [facility])   High anal fistula   Acute osteomyelitis of coccyx ([place]) Postop care per surgical team. Was continued on IV Zosyn. ID has been following for abx recommendations, defer abx to ID service Analgesics as needed.     Leukocytosis Postoperatively. LLL infiltrate on CXR. (Atelectasis vs aspiration) Continue IV antibiotics as per ID recs WBC has improved to normal at day of d/c     Chronic disease anemia Secondary to: 1) anemia off chronic disease. 2) recent intraoperative blood loss. Hgb 7.2 this AM Pt had been walking briskly in hallway without issues or sob     Thrombocytosis In the setting of anemia, infection and surgical intervention. Plt count trending towards normal  Dyspnea -Resolved -Recent chest imaging with infiltrate/atelectasis in LLL. On abx per above -Pt currently denies any dyspnea and was observed walking briskly in hallway on room air -O2 sats remained stable on room air  Vit B12 Deficiency -B12 level of 56 -Have ordered B12 replacement  As of now from a medical standpoint, pt is stable and can follow up closely with his PCP. Will sign off for now. Thank you for this consult   DVT prophylaxis: Lovenox subq Code Status: Full Family Communication: Pt in room, family not at bedside   Antimicrobials: Anti-infectives (From admission, onward)    Start     Dose/Rate Route Frequency Ordered Stop   07/10/21 2200  cephALEXin (KEFLEX) capsule 500 mg  Status:  Discontinued  500 mg Oral Every 6 hours 07/09/21 2136 07/10/21 0752   07/10/21 1000  cefdinir (OMNICEF) capsule 300 mg        300 mg Oral Every 12 hours 07/10/21 0752     07/10/21 0000  cefdinir (OMNICEF) 300 MG capsule        300 mg Oral Every 12 hours 07/10/21 0757 08/07/21 2359   07/10/21 0000  metroNIDAZOLE (FLAGYL) 500 MG tablet        500 mg Oral Every 12 hours 07/10/21 0757 08/07/21  2359   07/09/21 2200  metroNIDAZOLE (FLAGYL) tablet 500 mg        500 mg Oral Every 12 hours 07/09/21 2136     07/08/21 1345  amoxicillin-clavulanate (AUGMENTIN) 875-125 MG per tablet 1 tablet  Status:  Discontinued        1 tablet Oral Every 12 hours 07/08/21 1253 07/09/21 2136   07/08/21 0600  cefTRIAXone (ROCEPHIN) 2 g in sodium chloride 0.9 % 100 mL IVPB  Status:  Discontinued       See Hyperspace for full Linked Orders Report.   2 g 200 mL/hr over 30 Minutes Intravenous On call to O.R. 07/07/21 1255 07/07/21 1300   07/07/21 2200  piperacillin-tazobactam (ZOSYN) IVPB 3.375 g  Status:  Discontinued        3.375 g 12.5 mL/hr over 240 Minutes Intravenous Every 8 hours 07/07/21 1738 07/08/21 1253   07/07/21 1300  cefTRIAXone (ROCEPHIN) 2 g in sodium chloride 0.9 % 100 mL IVPB  Status:  Discontinued        2 g 200 mL/hr over 30 Minutes Intravenous  Once 07/07/21 1251 07/07/21 1738   07/07/21 1300  metroNIDAZOLE (FLAGYL) IVPB 500 mg  Status:  Discontinued       See Hyperspace for full Linked Orders Report.   500 mg 100 mL/hr over 60 Minutes Intravenous Every 8 hours 07/07/21 1255 07/08/21 1025   07/07/21 1257  sodium chloride 0.9 % with cefTRIAXone (ROCEPHIN) ADS Med       Note to Pharmacy: Marchia Meiers   : cabinet override      07/07/21 1257 07/08/21 0059       Subjective: Looking forward to going home  Objective: Vitals:   07/09/21 0855 07/09/21 2217 07/10/21 0401 07/10/21 0900  BP: 137/78 118/64 120/70   Pulse: 77 74 (!) 59   Resp: 19 17 14    Temp: 98.5 F (36.9 C) 98.9 F (37.2 C) 98.6 F (37 C)   TempSrc: Oral Oral Oral   SpO2: 100% 98% 98%   Weight:    89.8 kg  Height:    6' 1"  (1.854 m)    Intake/Output Summary (Last 24 hours) at 07/10/2021 1215 Last data filed at 07/10/2021 9379 Gross per 24 hour  Intake 504.44 ml  Output 1450 ml  Net -945.56 ml    Filed Weights   07/07/21 1125 07/10/21 0900  Weight: 89.8 kg 89.8 kg    Examination: General exam:  Conversant, in no acute distress Respiratory system: normal chest rise, clear, no audible wheezing Cardiovascular system: regular rhythm, s1-s2 Gastrointestinal system: Nondistended, nontender, pos BS Central nervous system: No seizures, no tremors Extremities: No cyanosis, no joint deformities Skin: No rashes, no pallor Psychiatry: Affect normal // no auditory hallucinations   Data Reviewed: I have personally reviewed following labs and imaging studies  CBC: Recent Labs  Lab 07/08/21 0419 07/08/21 1336 07/09/21 0457 07/10/21 0448  WBC 23.2*  --  12.7* 9.9  HGB 8.0* 8.0* 7.2*  7.5*  HCT 24.5* 24.3* 21.8* 23.0*  MCV 90.1  --  89.3 89.1  PLT 596*  --  457* 495*    Basic Metabolic Panel: Recent Labs  Lab 07/08/21 0419 07/08/21 0636 07/08/21 1336 07/09/21 0457 07/10/21 0448  NA 134*  --  137 138 139  K 6.4* 5.5* 5.0 4.7 4.9  CL 107  --  108 111 107  CO2 21*  --  21* 22 22  GLUCOSE 159*  --  145* 93 85  BUN 24*  --  22* 18 15  CREATININE 1.89*  --  1.70* 1.53* 1.54*  CALCIUM 8.3*  --  8.0* 8.0* 8.7*  MG 2.6*  --   --   --   --     GFR: Estimated Creatinine Clearance: 74.2 mL/min (A) (by C-G formula based on SCr of 1.54 mg/dL (H)). Liver Function Tests: Recent Labs  Lab 07/10/21 0448  AST 18  ALT 26  ALKPHOS 77  BILITOT 0.4  PROT 6.9  ALBUMIN 2.9*   No results for input(s): LIPASE, AMYLASE in the last 168 hours. No results for input(s): AMMONIA in the last 168 hours. Coagulation Profile: No results for input(s): INR, PROTIME in the last 168 hours. Cardiac Enzymes: No results for input(s): CKTOTAL, CKMB, CKMBINDEX, TROPONINI in the last 168 hours. BNP (last 3 results) No results for input(s): PROBNP in the last 8760 hours. HbA1C: No results for input(s): HGBA1C in the last 72 hours. CBG: No results for input(s): GLUCAP in the last 168 hours. Lipid Profile: No results for input(s): CHOL, HDL, LDLCALC, TRIG, CHOLHDL, LDLDIRECT in the last 72 hours. Thyroid  Function Tests: No results for input(s): TSH, T4TOTAL, FREET4, T3FREE, THYROIDAB in the last 72 hours. Anemia Panel: Recent Labs    07/08/21 0635 07/09/21 0450  VITAMINB12 56*  --   FOLATE 6.4  --   FERRITIN 193  --   TIBC 157*  --   IRON 18*  --   RETICCTPCT  --  1.2    Sepsis Labs: No results for input(s): PROCALCITON, LATICACIDVEN in the last 168 hours.  Recent Results (from the past 240 hour(s))  Aerobic/Anaerobic Culture w Gram Stain (surgical/deep wound)     Status: None (Preliminary result)   Collection Time: 07/07/21  3:10 PM   Specimen: Wound; Abscess  Result Value Ref Range Status   Specimen Description   Final    WOUND POSTERIOR RECTAL FISTULA ABSCESS Performed at [facility] [address] [place], [place] [address]    Special Requests   Final    NONE Performed at [facility], [place] [address], [place], [place] [address]    Gram Stain   Final    MODERATE WBC PRESENT, PREDOMINANTLY MONONUCLEAR ABUNDANT GRAM POSITIVE COCCI ABUNDANT GRAM NEGATIVE RODS MODERATE GRAM POSITIVE RODS    Culture   Final    ABUNDANT ESCHERICHIA COLI CULTURE REINCUBATED FOR BETTER GROWTH Performed at [facility], [place] [address] [place], [place] [address]    Report Status PENDING  Incomplete   Organism ID, Bacteria ESCHERICHIA COLI  Final      Susceptibility   Escherichia coli - MIC*    AMPICILLIN >=32 RESISTANT Resistant     CEFAZOLIN 8 SENSITIVE Sensitive     CEFEPIME <=0.12 SENSITIVE Sensitive     CEFTAZIDIME <=1 SENSITIVE Sensitive     CEFTRIAXONE <=0.25 SENSITIVE Sensitive     CIPROFLOXACIN <=0.25 SENSITIVE Sensitive     GENTAMICIN <=1 SENSITIVE Sensitive  IMIPENEM <=0.25 SENSITIVE Sensitive     TRIMETH/SULFA <=20 SENSITIVE Sensitive     AMPICILLIN/SULBACTAM >=32 RESISTANT Resistant     PIP/TAZO <=4 SENSITIVE Sensitive     * ABUNDANT ESCHERICHIA COLI      Radiology Studies: No results found.  Scheduled Meds:   acetaminophen  1,000 mg Oral Q6H   cefdinir  300 mg Oral Q12H   insulin aspart  5 Units Intravenous Once   And   dextrose  1 ampule Intravenous Once   DULoxetine  60 mg Oral Daily   enoxaparin (LOVENOX) injection  40 mg Subcutaneous Q24H   lip balm  1 application Topical BID   metroNIDAZOLE  500 mg Oral Q12H   polycarbophil  625 mg Oral BID   sodium chloride flush  3 mL Intravenous Q12H   sodium chloride flush  3 mL Intravenous Q12H   vitamin B-12  1,000 mcg Oral Daily   Continuous Infusions:  sodium chloride     sodium chloride       LOS: 2 days   Marylu Lund, MD [facility] Pager On Amion  If 7PM-7AM, please contact night-coverage 07/10/2021, 12:15 PM

## 2021-07-10 NOTE — Progress Notes (Signed)
Pt discharging home. AVS printed. Educational with dressing change teaching completed with teach back method. Dressing supplies given to pt. IV removed. VSS. No further questions at this time.

## 2021-07-10 NOTE — Progress Notes (Signed)
Shawn [name] [id number] Apr 29, 1986  CARE TEAM:  PCP: Charlott Rakes, MD  Outpatient Care Team: Patient Care Team: Charlott Rakes, MD as PCP - General (Family Medicine) [name], MD as Consulting Physician (Colon and Rectal Surgery) Comer, Okey Regal, MD as Consulting Physician (Infectious Diseases) Ladene Artist, MD as Consulting Physician (Gastroenterology) Donnie Mesa, MD as Consulting Physician (General Surgery)  Inpatient Treatment Team: Treatment Team: Attending Provider: [name], MD; Social Worker: Merri Brunette; Rounding Team: Ian Bushman, MD; Registered Nurse: Aldean Ast, RN; Registered Nurse: Kizzie Ide, RN; Utilization Review: Tressie Stalker, RN   Problem List:   Principal Problem:   Crohn's proctitis, with fistula [facility]) Active Problems:   Pelvic abscess in male [facility])   High anal fistula   Acute osteomyelitis of coccyx ([place])   AKI (acute kidney injury) ([place])   Hyperkalemia   Chronic disease anemia   Hypermagnesemia   Thrombocytosis   Leukocytosis   Anemia B twelve deficiency   Right lower lobe pulmonary infiltrate   Acute blood loss anemia ([facility]LA)   3 Days Post-Op  07/07/2021  POST-OPERATIVE DIAGNOSIS:  HIGH ANAL FISTULA 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  Awaiting medicine input to see if they feel he needs to stay longer to help sort things out  Solid diet  [place] [facility] to start doing Remicade later this month for his Crohn's disease.  Ellouise Newer with  [facility] gastroenterology aware.  Okay to start from my standpoint since abscess drained and setons in place.   Increased creatinine most likely reflection of dehydration and possible CKD.  Good urine output.  Creatinine stable at 1.5.    Hyperkalemia resolved.  K normal range x 48hr  Patient has complaints of dyspnea on exertion.  Medicine has placed the patient on telemetry and move them to telemetry floor.  We will see if they can assume care if they are planning more aggressive work-up  Foley out.  Urinating.  Follow urine output.  Antibiotics per ID - I believe they switched to Augmentin orally.  At least 2 more weeks according to them.  Pathology did show coccygeal osteomyelitis but I removed the loose fragments so that should help.  Follow-up on cultures - GS w multiple species not surprising.  Worsening anemia.  Probably combination of some blood loss anemia on top of anemia of chronic disease.  Anemia panel showing iron deficiency and vitamin B12 deficiency.  I gave IV iron.  Dr. Wyline Copas ordered vitamin B12 replacement.     VTE prophylaxis- SCDs, etc  -mobilize as tolerated to help recovery    Disposition:  Disposition:  The patient is from: Home  Anticipate discharge to:  Home  Anticipated Date of Discharge is:  November 18,2022    Barriers to discharge:  Consultant clearance & sign off    Patient currently is NOT MEDICALLY STABLE for discharge from the hospital from a surgery standpoint.      25 minutes spent in review, evaluation, examination, counseling, and coordination of care.   I have reviewed this patient's available data, including medical history, events of  note, physical examination and test results as part of my evaluation.  A significant portion of that time was spent in counseling.  Care during the described time interval was provided by me.  07/10/2021    Subjective: (Chief complaint)  Pain mostly controlled.  Tolerating solid food.  Walk around  better.  Urinating okay.  Worried about potassium, no bowel movement, vitamin B12.  Objective:  Vital signs:  Vitals:   07/09/21 0503 07/09/21 0855 07/09/21 2217 07/10/21 0401  BP: 125/69 137/78 118/64 120/70  Pulse: 61 77 74 (!) 59  Resp: 18 19 17 14   Temp: 98.7 F (37.1 C) 98.5 F (36.9 C) 98.9 F (37.2 C) 98.6 F (37 C)  TempSrc:  Oral Oral Oral  SpO2: 99% 100% 98% 98%  Weight:      Height:        Last BM Date: 07/07/21  Intake/Output   Yesterday:  11/17 0701 - 11/18 0700 In: 267.4 [I.V.:3; IV Piggyback:264.4] Out: 2250 [Urine:2250] This shift:  No intake/output data recorded.  Bowel function:  Flatus: YES  BM:  No  Drain: (No drain)   Physical Exam:  General: Pt awake/alert in no acute distress.  Lying on his side.  Not toxic nor sickly. Eyes: PERRL, normal EOM.  Sclera clear.  No icterus Neuro: CN II-XII intact w/o focal sensory/motor deficits. Lymph: No head/neck/groin lymphadenopathy Psych:  No delerium/psychosis/paranoia.  Oriented x 4 HENT: Normocephalic, Mucus membranes moist.  No thrush Neck: Supple, No tracheal deviation.  No obvious thyromegaly Chest: No pain to chest wall compression.  Good respiratory excursion.  No audible wheezing CV:  Pulses intact.  Regular rhythm.  No major extremity edema MS: Normal AROM mjr joints.  No obvious deformity Abdomen: Soft.  Nondistended.  Nontender.  No evidence of peritonitis.  No incarcerated hernias. GU:  No foley  Rectal: wounds clean with blue vessel tape setons in place.  Without purulence.  Tone intact  Ext:   No deformity.  No mjr edema.  No cyanosis Skin: No petechiae / purpurea.  No major sores.  Warm and dry    Results:   Cultures: Recent Results (from the past 720 hour(s))  Aerobic/Anaerobic Culture w Gram Stain (surgical/deep wound)     Status: None (Preliminary result)   Collection Time: 07/07/21  3:10 PM   Specimen: Wound; Abscess  Result Value Ref Range Status   Specimen  Description   Final    WOUND POSTERIOR RECTAL FISTULA ABSCESS Performed at [place] [address] [place], [place] [address]    Special Requests   Final    NONE Performed at [facility], [place] [address] [place], [place] [address]    Gram Stain   Final    MODERATE WBC PRESENT, PREDOMINANTLY MONONUCLEAR ABUNDANT GRAM POSITIVE COCCI ABUNDANT GRAM NEGATIVE RODS MODERATE GRAM POSITIVE RODS    Culture   Final    ABUNDANT ESCHERICHIA COLI CULTURE REINCUBATED FOR BETTER GROWTH Performed at [facility], [place] [address] [place], [place] [address]    Report Status PENDING  Incomplete   Organism ID, Bacteria ESCHERICHIA COLI  Final      Susceptibility   Escherichia coli - MIC*    AMPICILLIN >=32 RESISTANT Resistant     CEFAZOLIN 8 SENSITIVE Sensitive     CEFEPIME <=0.12 SENSITIVE Sensitive     CEFTAZIDIME <=1 SENSITIVE Sensitive     CEFTRIAXONE <=0.25 SENSITIVE Sensitive     CIPROFLOXACIN <=0.25 SENSITIVE Sensitive  GENTAMICIN <=1 SENSITIVE Sensitive     IMIPENEM <=0.25 SENSITIVE Sensitive     TRIMETH/SULFA <=20 SENSITIVE Sensitive     AMPICILLIN/SULBACTAM >=32 RESISTANT Resistant     PIP/TAZO <=4 SENSITIVE Sensitive     * ABUNDANT ESCHERICHIA COLI    Labs: Results for orders placed or performed during the hospital encounter of 07/07/21 (from the past 48 hour(s))  Strep pneumoniae urinary antigen     Status: None   Collection Time: 07/08/21  1:21 PM  Result Value Ref Range   Strep Pneumo Urinary Antigen NEGATIVE NEGATIVE    Comment:        Infection due to S. pneumoniae cannot be absolutely ruled out since the antigen present may be below the detection limit of the test. Performed at [facility], [address] [place], [place] [address]   Basic metabolic panel     Status: Abnormal   Collection Time: 07/08/21  1:36 PM  Result Value Ref Range   Sodium 137 135 - 145 mmol/L   Potassium 5.0 3.5 - 5.1 mmol/L    Chloride 108 98 - 111 mmol/L   CO2 21 (L) 22 - 32 mmol/L   Glucose, Bld 145 (H) 70 - 99 mg/dL    Comment: Glucose reference range applies only to samples taken after fasting for at least 8 hours.   BUN 22 (H) 6 - 20 mg/dL   Creatinine, Ser 1.70 (H) 0.61 - 1.24 mg/dL   Calcium 8.0 (L) 8.9 - 10.3 mg/dL   GFR, Estimated 53 (L) >60 mL/min    Comment: (NOTE) Calculated using the CKD-EPI Creatinine Equation (2021)    Anion gap 8 5 - 15    Comment: Performed at [facility], [place] [address] [place], [place] [address]  Hemoglobin and hematocrit, blood     Status: Abnormal   Collection Time: 07/08/21  1:36 PM  Result Value Ref Range   Hemoglobin 8.0 (L) 13.0 - 17.0 g/dL   HCT 24.3 (L) 39.0 - 52.0 %    Comment: Performed at [facility], [place] [address], [place], [place] [address]  Reticulocytes     Status: Abnormal   Collection Time: 07/09/21  4:50 AM  Result Value Ref Range   Retic Ct Pct 1.2 0.4 - 3.1 %   RBC. 2.40 (L) 4.22 - 5.81 MIL/uL   Retic Count, Absolute 29.5 19.0 - 186.0 K/uL   Immature Retic Fract 27.0 (H) 2.3 - 15.9 %    Comment: Performed at [facility], [place] [address], [place], [place] [address]  Basic metabolic panel     Status: Abnormal   Collection Time: 07/09/21  4:57 AM  Result Value Ref Range   Sodium 138 135 - 145 mmol/L   Potassium 4.7 3.5 - 5.1 mmol/L   Chloride 111 98 - 111 mmol/L   CO2 22 22 - 32 mmol/L   Glucose, Bld 93 70 - 99 mg/dL    Comment: Glucose reference range applies only to samples taken after fasting for at least 8 hours.   BUN 18 6 - 20 mg/dL   Creatinine, Ser 1.53 (H) 0.61 - 1.24 mg/dL   Calcium 8.0 (L) 8.9 - 10.3 mg/dL   GFR, Estimated 60 (L) >60 mL/min    Comment: (NOTE) Calculated using the CKD-EPI Creatinine Equation (2021)    Anion gap 5 5 - 15    Comment: Performed at [facility], [place] [address], [place], [place] [address]  CBC  Status: Abnormal    Collection Time: 07/09/21  4:57 AM  Result Value Ref Range   WBC 12.7 (H) 4.0 - 10.5 K/uL   RBC 2.44 (L) 4.22 - 5.81 MIL/uL   Hemoglobin 7.2 (L) 13.0 - 17.0 g/dL   HCT 21.8 (L) 39.0 - 52.0 %   MCV 89.3 80.0 - 100.0 fL   MCH 29.5 26.0 - 34.0 pg   MCHC 33.0 30.0 - 36.0 g/dL   RDW 16.3 (H) 11.5 - 15.5 %   Platelets 457 (H) 150 - 400 K/uL   nRBC 0.0 0.0 - 0.2 %    Comment: Performed at [facility], [place] [address] [place], [place] [address]  Comprehensive metabolic panel     Status: Abnormal   Collection Time: 07/10/21  4:48 AM  Result Value Ref Range   Sodium 139 135 - 145 mmol/L   Potassium 4.9 3.5 - 5.1 mmol/L   Chloride 107 98 - 111 mmol/L   CO2 22 22 - 32 mmol/L   Glucose, Bld 85 70 - 99 mg/dL    Comment: Glucose reference range applies only to samples taken after fasting for at least 8 hours.   BUN 15 6 - 20 mg/dL   Creatinine, Ser 1.54 (H) 0.61 - 1.24 mg/dL   Calcium 8.7 (L) 8.9 - 10.3 mg/dL   Total Protein 6.9 6.5 - 8.1 g/dL   Albumin 2.9 (L) 3.5 - 5.0 g/dL   AST 18 15 - 41 U/L   ALT 26 0 - 44 U/L   Alkaline Phosphatase 77 38 - 126 U/L   Total Bilirubin 0.4 0.3 - 1.2 mg/dL   GFR, Estimated 59 (L) >60 mL/min    Comment: (NOTE) Calculated using the CKD-EPI Creatinine Equation (2021)    Anion gap 10 5 - 15    Comment: Performed at [facility], [place] [address], [place], [place] [address]  CBC     Status: Abnormal   Collection Time: 07/10/21  4:48 AM  Result Value Ref Range   WBC 9.9 4.0 - 10.5 K/uL   RBC 2.58 (L) 4.22 - 5.81 MIL/uL   Hemoglobin 7.5 (L) 13.0 - 17.0 g/dL   HCT 23.0 (L) 39.0 - 52.0 %   MCV 89.1 80.0 - 100.0 fL   MCH 29.1 26.0 - 34.0 pg   MCHC 32.6 30.0 - 36.0 g/dL   RDW 16.3 (H) 11.5 - 15.5 %   Platelets 495 (H) 150 - 400 K/uL   nRBC 0.0 0.0 - 0.2 %    Comment: Performed at [facility], [place] [address] [place], [place] [address]    Imaging / Studies: DG Chest 2 View  Result Date:  07/08/2021 CLINICAL DATA:  Shortness of breath with exertion EXAM: CHEST - 2 VIEW COMPARISON:  04/19/2021 FINDINGS: heart size is normal. Mediastinal shadows are normal. The left lung is clear. There is patchy infiltrate/atelectasis in the right lower lobe. No dense consolidation or lobar collapse. No effusion. Bony structures unremarkable. IMPRESSION: Patchy atelectasis/infiltrate in the right lower lobe. Electronically Signed   By: Nelson Chimes M.D.   On: 07/08/2021 10:48    Medications / Allergies: per chart  Antibiotics: Anti-infectives (From admission, onward)    Start     Dose/Rate Route Frequency Ordered Stop   07/10/21 2200  cephALEXin (KEFLEX) capsule 500 mg        500 mg Oral Every 6 hours 07/09/21 2136     07/09/21 2200  metroNIDAZOLE (FLAGYL) tablet 500  mg        500 mg Oral Every 12 hours 07/09/21 2136     07/08/21 1345  amoxicillin-clavulanate (AUGMENTIN) 875-125 MG per tablet 1 tablet  Status:  Discontinued        1 tablet Oral Every 12 hours 07/08/21 1253 07/09/21 2136   07/08/21 0600  cefTRIAXone (ROCEPHIN) 2 g in sodium chloride 0.9 % 100 mL IVPB  Status:  Discontinued       See Hyperspace for full Linked Orders Report.   2 g 200 mL/hr over 30 Minutes Intravenous On call to O.R. 07/07/21 1255 07/07/21 1300   07/07/21 2200  piperacillin-tazobactam (ZOSYN) IVPB 3.375 g  Status:  Discontinued        3.375 g 12.5 mL/hr over 240 Minutes Intravenous Every 8 hours 07/07/21 1738 07/08/21 1253   07/07/21 1300  cefTRIAXone (ROCEPHIN) 2 g in sodium chloride 0.9 % 100 mL IVPB  Status:  Discontinued        2 g 200 mL/hr over 30 Minutes Intravenous  Once 07/07/21 1251 07/07/21 1738   07/07/21 1300  metroNIDAZOLE (FLAGYL) IVPB 500 mg  Status:  Discontinued       See Hyperspace for full Linked Orders Report.   500 mg 100 mL/hr over 60 Minutes Intravenous Every 8 hours 07/07/21 1255 07/08/21 1025   07/07/21 1257  sodium chloride 0.9 % with cefTRIAXone (ROCEPHIN) ADS Med       Note to  Pharmacy: Marchia Meiers   : cabinet override      07/07/21 1257 07/08/21 0059         Note: Portions of this report may have been transcribed using voice recognition software. Every effort was made to ensure accuracy; however, inadvertent computerized transcription errors may be present.   Any transcriptional errors that result from this process are unintentional.    Adin Hector, MD, FACS, MASCRS Esophageal, Gastrointestinal & Colorectal Surgery Robotic and Minimally Invasive Surgery  [facility], [place]  [place]. [address], [place], [place] [address] [phone] Fax [phone] Main  CONTACT INFORMATION:  Weekday (9AM-5PM): Call [facility] main office at [phone]  Weeknight (5PM-9AM) or Weekend/Holiday: Check [email or web address] (password " [email or web address]") for General Surgery [facility] coverage  (Please, do not use SecureChat as it is not reliable communication to operating surgeons for immediate patient care)      07/10/2021  7:26 AM

## 2021-07-10 NOTE — Discharge Summary (Signed)
Physician Discharge Summary    Patient ID: Shawn [name] MRN: [id number] DOB/AGE: 37-Feb-1987  35 y.o.  Patient Care Team: Charlott Rakes, MD as PCP - General (Family Medicine) [name], MD as Consulting Physician (Colon and Rectal Surgery) Comer, Okey Regal, MD as Consulting Physician (Infectious Diseases) Ladene Artist, MD as Consulting Physician (Gastroenterology) Donnie Mesa, MD as Consulting Physician (General Surgery)  Admit date: 07/07/2021  Discharge date: 07/10/2021  Hospital Stay = 2 days    Discharge Diagnoses:  Principal Problem:   Crohn's proctitis, with fistula ([place]) Active Problems:   Pelvic abscess in male [facility])   High anal fistula   Acute osteomyelitis of coccyx (HCC)   AKI (acute kidney injury) ([place])   Hyperkalemia   Chronic disease anemia   Hypermagnesemia   Thrombocytosis   Leukocytosis   Anemia B twelve deficiency   Right lower lobe pulmonary infiltrate   Acute blood loss anemia ([facility]LA)   Vitamin B12 deficiency   3 Days Post-Op  07/07/2021  POST-OPERATIVE DIAGNOSIS:  HIGH ANAL FISTULA WITH HORSESHOE ABSCESS 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  Probable horseshoe-like extensions right and left anterolateral.  Counterincisions and setons placed for modified Hanley drainage procedure     External location NONE (chronic abscess cavity sitting at coccyx and anococcygeal region)   Internal location : Posterior midline rectum about 2.5 cm from anal verge.     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  Hospital Course:   Patient struggling with recurrent complex peritoneal abscesses and diagnosed with Crohn's proctitis.  Chronic gas around coccyx suspected for osteomyelitis.  Came to my office in severe pain.  Underwent urgent complex drainage of chronic horseshoe abscess with coccygectomy.  The patient underwent  the surgery above.  Postoperatively, the patient gradually mobilized and advanced to a solid diet.  Pain and other symptoms were treated aggressively.    Patient had complaints of dyspnea on exertion.  Internal medicine consultation requested.  They placed on telemetry.  No events on 48 hours.  Chest x-ray favored atelectasis.  No major cardiac or pulmonary issues.  He had some borderline elevated potassium and mildly elevated creatinine but improved.  Patient.  He had good urine output.  He had significant anemia.  Iron panel noted iron deficiency.  I gave him IV dextran.  Vitamin B12 noted as well.  Medicine ordered vitamin B12 replacement.  Reached out to infectious disease.  Patient placed on IV antibiotics.  Initially switched to Augmentin but he grew out an E. coli resistant ampicillin.  Switch to different antibiotic regimen.  Gastrology wishes to start Remicade within the next coming month when cleared by infectious disease.  Okay to proceed from surgery standpoint  By the time of discharge, the patient was walking well the hallways, eating food, having flatus.  Pain was well-controlled on an oral medications.  Based on meeting discharge criteria and continuing to recover, I felt it was safe for the patient to be discharged from the hospital to further recover with close followup. Postoperative recommendations were discussed in  detail.  They are written as well.  Discharged Condition: good  Discharge Exam: Blood pressure 120/70, pulse (!) 59,  temperature 98.6 F (37 C), temperature source Oral, resp. rate 14, height 6' 1"  (1.854 m), weight 89.8 kg, SpO2 98 %.  General: Pt awake/alert/oriented x4 in No acute distress Eyes: PERRL, normal EOM.  Sclera clear.  No icterus Neuro: CN II-XII intact w/o focal sensory/motor deficits. Lymph: No head/neck/groin lymphadenopathy Psych:  No delerium/psychosis/paranoia HENT: Normocephalic, Mucus membranes moist.  No thrush Neck: Supple, No tracheal deviation Chest: No chest wall pain w good excursion CV:  Pulses intact.  Regular rhythm MS: Normal AROM mjr joints.  No obvious deformity Abdomen: Soft.  Nondistended.  Nontender  No evidence of peritonitis.  No incarcerated hernias. GU: Foley out urinating on own.  Rectal: blue vessel loop vascular tape setons in place.  Wounds clean.  Ext:  SCDs BLE.  No mjr edema.  No cyanosis Skin: No petechiae / purpura   Disposition:    Follow-up Information     Ladene Artist, MD. Call in 3 day(s).   Specialty: Gastroenterology Why: Call to check appointment to start Remicaide as soon as possible to control your Crohn's proctitis that led to the abscess Contact information: [address] [address] [phone]         [name], MD. Schedule an appointment as soon as possible for a visit in 1 month(s).   Specialties: General Surgery, Colon and Rectal Surgery Why: To follow up after your visit with GI & starting Remicaide, To follow up after your operation Contact information: [place] [address] [phone]                 Discharge disposition: 01-Home or Self Care       Discharge Instructions     Call MD for:  hives   Complete by: As directed    Call MD for:  persistant dizziness or light-headedness   Complete by: As directed    Call MD for:  persistant nausea and vomiting   Complete by: As directed    Call MD for:  redness, tenderness, or signs of infection  (pain, swelling, redness, odor or green/yellow discharge around incision site)   Complete by: As directed    You will often notice bleeding with bowel movements.  Expect some yellow or tan drainage.  This can occur for weeks, but should be mild by the end of the first week of surgery.  Wear an absorbent pad or soft cotton gauze in your underwear until the drainage stops   Call MD for:  severe uncontrolled pain   Complete by: As directed    Diet - low sodium heart healthy   Complete by: As directed    Discharge instructions   Complete by: As directed    See Rectal Surgery instruction sheet   Discharge wound care:   Complete by: As directed    -You have blue rubber band setons to help keep the chronic abscess open and draining.  Follow-up with surgery to gradually get these removed in the next few months after you start getting your Crohn's treated with Remicade or other immunosuppressant.  -Bathe / shower every day.  Just warm water.  Avoid salts/soaps.  Keep the area clean by showering / bathing over the incision / wound.   It is okay to soak an open wound to help wash it.   -Wet wipes or showers / gentle  washing after bowel movements is often less traumatic than regular toilet paper.  Consider using a squeeze bottle of warm water to rinse the perianal region. -You will notice bleeding & yellow drainage with bowel movements.  This should slow down by the end of the first week of surgery, but expect some drainage for many weeks.  Wear an absorbent pad or soft cotton gauze in your underwear as needed to catch any drainage and help keep the area clean and dry.   Driving Restrictions   Complete by: As directed    You may drive when you are no longer taking prescription pain medication, you can comfortably sit for long periods of time, and you can safely maneuver your car and apply brakes.   Increase activity slowly   Complete by: As directed    Lifting restrictions   Complete by: As directed     You may resume regular (light) daily activities beginning the next day-such as daily self-care, walking, climbing stairs-gradually increasing activities as tolerated.  If you can walk 30 minutes without difficulty, it is safe to try more intense activity such as jogging, treadmill, bicycling, low-impact aerobics, swimming, etc. Save the most intensive and strenuous activity for last such as sit-ups, heavy lifting, contact sports, etc  Refrain from any heavy lifting or straining until you are off narcotics for pain control.  Remember: if it hurts to do it, don't do it: STOP   May shower / Bathe   Complete by: As directed    Warm water sitz baths/tub soaks x 20-30 minutes, 4-8 times a day for comfort   May walk up steps   Complete by: As directed    Sexual Activity Restrictions   Complete by: As directed    You may have sexual intercourse when it is comfortable. If it hurts to do it, STOP.       Allergies as of 07/10/2021       Reactions   Shrimp [shellfish Allergy] Shortness Of Breath   Nsaids Other (See Comments)   CROHN'S DISEASE = NO NSAIDS        Medication List     TAKE these medications    acetaminophen 500 MG tablet Commonly known as: TYLENOL Take 2 tablets (1,000 mg total) by mouth every 6 (six) hours. What changed:  when to take this reasons to take this   cefdinir 300 MG capsule Commonly known as: OMNICEF Take 1 capsule (300 mg total) by mouth every 12 (twelve) hours for 28 days.   cyanocobalamin 1000 MCG tablet Take 1 tablet (1,000 mcg total) by mouth daily.   DULoxetine 60 MG capsule Commonly known as: Cymbalta Take 1 capsule (60 mg total) by mouth daily. For tail bone pain   methocarbamol 500 MG tablet Commonly known as: ROBAXIN Take 1,000 mg by mouth 4 (four) times daily.   metroNIDAZOLE 500 MG tablet Commonly known as: FLAGYL Take 1 tablet (500 mg total) by mouth every 12 (twelve) hours for 28 days.   multivitamin-iron-minerals-folic acid  chewable tablet Chew 1 tablet by mouth daily.   oxyCODONE 5 MG immediate release tablet Commonly known as: Oxy IR/ROXICODONE Take 5-10 mg by mouth every 6 (six) hours as needed for pain.   polyethylene glycol powder 17 GM/SCOOP powder Commonly known as: GLYCOLAX/MIRALAX Take 17 g by mouth daily. What changed:  when to take this reasons to take this               Discharge Care Instructions  (From admission,  onward)           Start     Ordered   07/10/21 0000  Discharge wound care:       Comments: -You have blue rubber band setons to help keep the chronic abscess open and draining.  Follow-up with surgery to gradually get these removed in the next few months after you start getting your Crohn's treated with Remicade or other immunosuppressant.  -Bathe / shower every day.  Just warm water.  Avoid salts/soaps.  Keep the area clean by showering / bathing over the incision / wound.   It is okay to soak an open wound to help wash it.   -Wet wipes or showers / gentle washing after bowel movements is often less traumatic than regular toilet paper.  Consider using a squeeze bottle of warm water to rinse the perianal region. -You will notice bleeding & yellow drainage with bowel movements.  This should slow down by the end of the first week of surgery, but expect some drainage for many weeks.  Wear an absorbent pad or soft cotton gauze in your underwear as needed to catch any drainage and help keep the area clean and dry.   07/10/21 0754            Significant Diagnostic Studies:  Results for orders placed or performed during the hospital encounter of 07/07/21 (from the past 72 hour(s))  Surgical pathology     Status: None   Collection Time: 07/07/21  2:33 PM  Result Value Ref Range   SURGICAL PATHOLOGY      SURGICAL PATHOLOGY CASE: [id number] PATIENT: Shawn [name] Surgical Pathology Report     Clinical History: Perirectal fistula, high perirectal anal  abscess, Chron's proctitis ([facility])     FINAL MICROSCOPIC DIAGNOSIS:  A. COCCYX, ? OSTEOMYELITIS, BIOPSY: - Acute osteomyelitis. - Negative for malignancy.   Cinthya Bors DESCRIPTION:  A.  Received fresh and subsequently placed in formalin labeled with the patient's name and "Coccyx" is a 1.0 x 0.8 x 0.4 cm piece of red-tan, firm bony tissue, submitted in toto in a single cassette after decalcification.  (LEF 07/07/2021)    Final Diagnosis performed by Betsy Pries, MD.   Electronically signed 07/09/2021 Technical component performed at [facility], [place] [address], [place], [place] [address].  Professional component performed at [facility]. [place], [place] [address]  Immunohistochemistry Technical component (if applicable) was performed at [facility]. [address], [place], [place], [place] [address].  IMMUNOHISTOCHEMISTRY DISCLAIMER (if applicable): Some of these immunohistochemical stains may have been developed and the performance characteristics determine by [facility]. Some may not have been cleared or approved by the U.S. Food and Drug Administration. The FDA has determined that such clearance or approval is not necessary. This test is used for clinical purposes. It should not be regarded as investigational or for research. This laboratory is certified under the [place] (CLIA-88) as qualified to perform high complexity clinical laboratory testing.  The controls stained appropriately.   Aerobic/Anaerobic Culture w Gram Stain (surgical/deep wound)     Status: None (Preliminary result)   Collection Time: 07/07/21  3:10 PM   Specimen: Wound; Abscess  Result Value Ref Range   Specimen Description      WOUND POSTERIOR RECTAL FISTULA ABSCESS Performed at [place] [address] [place], [place] [address]     Special Requests  NONE Performed at [facility], [place] [address] [place], [place] [address]    Gram Stain      MODERATE WBC PRESENT, PREDOMINANTLY MONONUCLEAR ABUNDANT GRAM POSITIVE COCCI ABUNDANT GRAM NEGATIVE RODS MODERATE GRAM POSITIVE RODS    Culture      ABUNDANT ESCHERICHIA COLI CULTURE REINCUBATED FOR BETTER GROWTH Performed at [facility], [place] [address] [place], [place] [address]    Report Status PENDING    Organism ID, Bacteria ESCHERICHIA COLI       Susceptibility   Escherichia coli - MIC*    AMPICILLIN >=32 RESISTANT Resistant     CEFAZOLIN 8 SENSITIVE Sensitive     CEFEPIME <=0.12 SENSITIVE Sensitive     CEFTAZIDIME <=1 SENSITIVE Sensitive     CEFTRIAXONE <=0.25 SENSITIVE Sensitive     CIPROFLOXACIN <=0.25 SENSITIVE Sensitive     GENTAMICIN <=1 SENSITIVE Sensitive     IMIPENEM <=0.25 SENSITIVE Sensitive     TRIMETH/SULFA <=20 SENSITIVE Sensitive     AMPICILLIN/SULBACTAM >=32 RESISTANT Resistant     PIP/TAZO <=4 SENSITIVE Sensitive     * ABUNDANT ESCHERICHIA COLI  Basic metabolic panel     Status: Abnormal   Collection Time: 07/08/21  4:19 AM  Result Value Ref Range   Sodium 134 (L) 135 - 145 mmol/L   Potassium 6.4 (HH) 3.5 - 5.1 mmol/L    Comment: CRITICAL RESULT CALLED TO, READ BACK BY AND VERIFIED WITH:  EUGENIA SMITH RN 07/08/21 @ 0537 VS NO VISIBLE HEMOLYSIS    Chloride 107 98 - 111 mmol/L   CO2 21 (L) 22 - 32 mmol/L   Glucose, Bld 159 (H) 70 - 99 mg/dL    Comment: Glucose reference range applies only to samples taken after fasting for at least 8 hours.   BUN 24 (H) 6 - 20 mg/dL   Creatinine, Ser 1.89 (H) 0.61 - 1.24 mg/dL   Calcium 8.3 (L) 8.9 - 10.3 mg/dL   GFR, Estimated 46 (L) >60 mL/min    Comment: (NOTE) Calculated using the CKD-EPI Creatinine Equation (2021)    Anion gap 6 5 - 15    Comment: Performed at [facility], [place] [address] [place], [place] [address]  Magnesium     Status:  Abnormal   Collection Time: 07/08/21  4:19 AM  Result Value Ref Range   Magnesium 2.6 (H) 1.7 - 2.4 mg/dL    Comment: Performed at [facility], [place] [address] [place], [place] [address]  CBC     Status: Abnormal   Collection Time: 07/08/21  4:19 AM  Result Value Ref Range   WBC 23.2 (H) 4.0 - 10.5 K/uL   RBC 2.72 (L) 4.22 - 5.81 MIL/uL   Hemoglobin 8.0 (L) 13.0 - 17.0 g/dL   HCT 24.5 (L) 39.0 - 52.0 %   MCV 90.1 80.0 - 100.0 fL   MCH 29.4 26.0 - 34.0 pg   MCHC 32.7 30.0 - 36.0 g/dL   RDW 16.4 (H) 11.5 - 15.5 %   Platelets 596 (H) 150 - 400 K/uL   nRBC 0.0 0.0 - 0.2 %    Comment: Performed at [facility], [place] [address] [place], [place] [address]  Vitamin B12     Status: Abnormal   Collection Time: 07/08/21  6:35 AM  Result Value Ref Range   Vitamin B-12 56 (L) 180 - 914 pg/mL    Comment: (NOTE) This assay is not validated for testing neonatal or myeloproliferative syndrome  specimens for Vitamin B12 levels. Performed at [facility], [place] [address] [place], [place] [address]   Folate     Status: None   Collection Time: 07/08/21  6:35 AM  Result Value Ref Range   Folate 6.4 >5.9 ng/mL    Comment: Performed at [facility], [place] [address], [place], [place] [address]  Iron and TIBC     Status: Abnormal   Collection Time: 07/08/21  6:35 AM  Result Value Ref Range   Iron 18 (L) 45 - 182 ug/dL   TIBC 157 (L) 250 - 450 ug/dL   Saturation Ratios 11 (L) 17.9 - 39.5 %   UIBC 139 ug/dL    Comment: Performed at [facility], [place] [address] [place], [place] [address]  Ferritin     Status: None   Collection Time: 07/08/21  6:35 AM  Result Value Ref Range   Ferritin 193 24 - 336 ng/mL    Comment: Performed at [facility], [place] [address], [place], [place] [address]  Potassium     Status: Abnormal   Collection Time: 07/08/21  6:36 AM  Result Value Ref Range    Potassium 5.5 (H) 3.5 - 5.1 mmol/L    Comment: NO VISIBLE HEMOLYSIS Performed at [facility], [place] [address] [place], [place] [address]   Strep pneumoniae urinary antigen     Status: None   Collection Time: 07/08/21  1:21 PM  Result Value Ref Range   Strep Pneumo Urinary Antigen NEGATIVE NEGATIVE    Comment:        Infection due to S. pneumoniae cannot be absolutely ruled out since the antigen present may be below the detection limit of the test. Performed at [facility], [address] [place], [place] [address]   Basic metabolic panel     Status: Abnormal   Collection Time: 07/08/21  1:36 PM  Result Value Ref Range   Sodium 137 135 - 145 mmol/L   Potassium 5.0 3.5 - 5.1 mmol/L   Chloride 108 98 - 111 mmol/L   CO2 21 (L) 22 - 32 mmol/L   Glucose, Bld 145 (H) 70 - 99 mg/dL    Comment: Glucose reference range applies only to samples taken after fasting for at least 8 hours.   BUN 22 (H) 6 - 20 mg/dL   Creatinine, Ser 1.70 (H) 0.61 - 1.24 mg/dL   Calcium 8.0 (L) 8.9 - 10.3 mg/dL   GFR, Estimated 53 (L) >60 mL/min    Comment: (NOTE) Calculated using the CKD-EPI Creatinine Equation (2021)    Anion gap 8 5 - 15    Comment: Performed at [facility], [place] [address], [place], [place] [address]  Hemoglobin and hematocrit, blood     Status: Abnormal   Collection Time: 07/08/21  1:36 PM  Result Value Ref Range   Hemoglobin 8.0 (L) 13.0 - 17.0 g/dL   HCT 24.3 (L) 39.0 - 52.0 %    Comment: Performed at [facility], [place] [address], [place], [place] [address]  Reticulocytes     Status: Abnormal   Collection Time: 07/09/21  4:50 AM  Result Value Ref Range   Retic Ct Pct 1.2 0.4 - 3.1 %   RBC. 2.40 (L) 4.22 - 5.81 MIL/uL   Retic Count, Absolute 29.5 19.0 - 186.0 K/uL   Immature Retic Fract 27.0 (H) 2.3 - 15.9 %    Comment: Performed at [facility]  [place] [address], [place], [place] [address]  Basic  metabolic panel     Status: Abnormal   Collection Time: 07/09/21  4:57 AM  Result Value Ref Range   Sodium 138 135 - 145 mmol/L   Potassium 4.7 3.5 - 5.1 mmol/L   Chloride 111 98 - 111 mmol/L   CO2 22 22 - 32 mmol/L   Glucose, Bld 93 70 - 99 mg/dL    Comment: Glucose reference range applies only to samples taken after fasting for at least 8 hours.   BUN 18 6 - 20 mg/dL   Creatinine, Ser 1.53 (H) 0.61 - 1.24 mg/dL   Calcium 8.0 (L) 8.9 - 10.3 mg/dL   GFR, Estimated 60 (L) >60 mL/min    Comment: (NOTE) Calculated using the CKD-EPI Creatinine Equation (2021)    Anion gap 5 5 - 15    Comment: Performed at [facility], [place] [address], [place], [place] [address]  CBC     Status: Abnormal   Collection Time: 07/09/21  4:57 AM  Result Value Ref Range   WBC 12.7 (H) 4.0 - 10.5 K/uL   RBC 2.44 (L) 4.22 - 5.81 MIL/uL   Hemoglobin 7.2 (L) 13.0 - 17.0 g/dL   HCT 21.8 (L) 39.0 - 52.0 %   MCV 89.3 80.0 - 100.0 fL   MCH 29.5 26.0 - 34.0 pg   MCHC 33.0 30.0 - 36.0 g/dL   RDW 16.3 (H) 11.5 - 15.5 %   Platelets 457 (H) 150 - 400 K/uL   nRBC 0.0 0.0 - 0.2 %    Comment: Performed at [facility], [place] [address] [place], [place] [address]  Comprehensive metabolic panel     Status: Abnormal   Collection Time: 07/10/21  4:48 AM  Result Value Ref Range   Sodium 139 135 - 145 mmol/L   Potassium 4.9 3.5 - 5.1 mmol/L   Chloride 107 98 - 111 mmol/L   CO2 22 22 - 32 mmol/L   Glucose, Bld 85 70 - 99 mg/dL    Comment: Glucose reference range applies only to samples taken after fasting for at least 8 hours.   BUN 15 6 - 20 mg/dL   Creatinine, Ser 1.54 (H) 0.61 - 1.24 mg/dL   Calcium 8.7 (L) 8.9 - 10.3 mg/dL   Total Protein 6.9 6.5 - 8.1 g/dL   Albumin 2.9 (L) 3.5 - 5.0 g/dL   AST 18 15 - 41 U/L   ALT 26 0 - 44 U/L   Alkaline Phosphatase 77 38 - 126 U/L   Total Bilirubin 0.4 0.3 - 1.2 mg/dL   GFR, Estimated 59 (L) >60 mL/min    Comment: (NOTE) Calculated using  the CKD-EPI Creatinine Equation (2021)    Anion gap 10 5 - 15    Comment: Performed at [facility], [place] [address], [place],  [address]  CBC     Status: Abnormal   Collection Time: 07/10/21  4:48 AM  Result Value Ref Range   WBC 9.9 4.0 - 10.5 K/uL   RBC 2.58 (L) 4.22 - 5.81 MIL/uL   Hemoglobin 7.5 (L) 13.0 - 17.0 g/dL   HCT 23.0 (L) 39.0 - 52.0 %   MCV 89.1 80.0 - 100.0 fL   MCH 29.1 26.0 - 34.0 pg   MCHC 32.6 30.0 - 36.0 g/dL   RDW 16.3 (H) 11.5 - 15.5 %   Platelets 495 (H) 150 - 400 K/uL  nRBC 0.0 0.0 - 0.2 %    Comment: Performed at [facility], [place] [address], [place], [place] [address]    DG Chest 2 View  Result Date: 07/08/2021 CLINICAL DATA:  Shortness of breath with exertion EXAM: CHEST - 2 VIEW COMPARISON:  04/19/2021 FINDINGS: heart size is normal. Mediastinal shadows are normal. The left lung is clear. There is patchy infiltrate/atelectasis in the right lower lobe. No dense consolidation or lobar collapse. No effusion. Bony structures unremarkable. IMPRESSION: Patchy atelectasis/infiltrate in the right lower lobe. Electronically Signed   By: Nelson Chimes M.D.   On: 07/08/2021 10:48    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   PO B12 initiated 12/31/20   Dilated cardiomyopathy ([place]) 05/18/2013   By catheterization 2014    Hypertension     Past Surgical History:  Procedure Laterality Date   BIOPSY  01/01/2021   Procedure: BIOPSY;  Surgeon: Ladene Artist, MD;  Location: [facility] ENDOSCOPY;  Service: Endoscopy;;   COLONOSCOPY WITH PROPOFOL N/A 01/01/2021   Procedure: COLONOSCOPY WITH PROPOFOL;  Surgeon: Ladene Artist, MD;  Location: [facility] ENDOSCOPY;  Service: Endoscopy;  Laterality: N/A;   INCISION AND DRAINAGE ABSCESS N/A 07/07/2021   Procedure: MODIFIED HANLEY PROCEDURE, DRAINAGE WITH SETON PLACEMENT;  Surgeon: [name], MD;  Location: [facility] ORS;   Service: General;  Laterality: N/A;   LEFT AND RIGHT HEART CATHETERIZATION WITH CORONARY ANGIOGRAM N/A 11/21/2012   Procedure: LEFT AND RIGHT HEART CATHETERIZATION WITH CORONARY ANGIOGRAM;  Surgeon: Birdie Riddle, MD;  Location: [facility] CATH LAB;  Service: Cardiovascular;  Laterality: N/A;   MANDIBLE FRACTURE SURGERY  2006   PLACEMENT OF SETON N/A 07/07/2021   Procedure: PLACEMENT OF SETON;  Surgeon: [name], MD;  Location: [facility] ORS;  Service: General;  Laterality: N/A;    Social History   Socioeconomic History   Marital status: Single    Spouse name: Not on file   Number of children: Not on file   Years of education: Not on file   Highest education level: Not on file  Occupational History   Not on file  Tobacco Use   Smoking status: Every Day    Packs/day: 0.25    Types: Cigarettes   Smokeless tobacco: Never  Vaping Use   Vaping Use: Never used  Substance and Sexual Activity   Alcohol use: Yes    Comment: occ   Drug use: No   Sexual activity: Not on file  Other Topics Concern   Not on file  Social History Narrative   Not on file   Social Determinants of Health   Financial Resource Strain: Not on file  Food Insecurity: Not on file  Transportation Needs: Not on file  Physical Activity: Not on file  Stress: Not on file  Social Connections: Not on file  Intimate Partner Violence: Not on file    Family History  Problem Relation Age of Onset   Diabetes Mother    Hypertension Other     Current Facility-Administered Medications  Medication Dose Route Frequency Provider Last Rate Last Admin   0.9 %  sodium chloride infusion  250 mL Intravenous PRN Reubin Milan, MD       0.9 %  sodium chloride infusion  250 mL Intravenous PRN [name], MD       acetaminophen (TYLENOL) tablet 1,000 mg  1,000 mg Oral Q6H Reubin Milan, MD   1,000  mg at 07/10/21 0529   cefdinir (OMNICEF) capsule 300 mg  300 mg Oral Q12H Vu, Trung T, MD       insulin aspart (novoLOG)  injection 5 Units  5 Units Intravenous Once Reubin Milan, MD       And   dextrose 50 % solution 50 mL  1 ampule Intravenous Once Reubin Milan, MD       diphenhydrAMINE (BENADRYL) 12.5 MG/5ML elixir 12.5 mg  12.5 mg Oral Q6H PRN Reubin Milan, MD       Or   diphenhydrAMINE (BENADRYL) injection 12.5 mg  12.5 mg Intravenous Q6H PRN Reubin Milan, MD       DULoxetine (CYMBALTA) DR capsule 60 mg  60 mg Oral Daily Reubin Milan, MD       enoxaparin (LOVENOX) injection 40 mg  40 mg Subcutaneous Q24H Reubin Milan, MD   40 mg at 07/09/21 0910   ipratropium-albuterol (DUONEB) 0.5-2.5 (3) MG/3ML nebulizer solution 3 mL  3 mL Nebulization Q4H PRN Reubin Milan, MD       lip balm (CARMEX) ointment 1 application  1 application Topical BID Reubin Milan, MD   1 application at 69/67/89 3810   magic mouthwash  15 mL Oral QID PRN Reubin Milan, MD       magnesium hydroxide (MILK OF MAGNESIA) suspension 30 mL  30 mL Oral Daily PRN Reubin Milan, MD       methocarbamol (ROBAXIN) tablet 500-1,000 mg  500-1,000 mg Oral Q6H PRN [name], MD       metoprolol tartrate (LOPRESSOR) injection 5 mg  5 mg Intravenous Q6H PRN Reubin Milan, MD       metroNIDAZOLE (FLAGYL) tablet 500 mg  500 mg Oral Q12H Lovey Newcomer T, NP   500 mg at 07/09/21 2155   oxyCODONE (Oxy IR/ROXICODONE) immediate release tablet 5-10 mg  5-10 mg Oral Q4H PRN Reubin Milan, MD   10 mg at 07/09/21 2157   polycarbophil (FIBERCON) tablet 625 mg  625 mg Oral BID Reubin Milan, MD   625 mg at 07/09/21 2155   polyethylene glycol (MIRALAX / GLYCOLAX) packet 17 g  17 g Oral Q12H PRN [name], MD       simethicone [facility]) chewable tablet 40 mg  40 mg Oral Q6H PRN Reubin Milan, MD       sodium chloride flush (NS) 0.9 % injection 3 mL  3 mL Intravenous Q12H Reubin Milan, MD   3 mL at 07/10/21 0050   sodium chloride flush (NS) 0.9 % injection 3 mL  3 mL  Intravenous PRN Reubin Milan, MD       sodium chloride flush (NS) 0.9 % injection 3 mL  3 mL Intravenous Gorden Harms, MD       sodium chloride flush (NS) 0.9 % injection 3 mL  3 mL Intravenous PRN [name], MD       vitamin B-12 (CYANOCOBALAMIN) tablet 1,000 mcg  1,000 mcg Oral Daily Donne Hazel, MD   1,000 mcg at 07/09/21 1813   Facility-Administered Medications Ordered in Other Encounters  Medication Dose Route Frequency Provider Last Rate Last Admin   bupivacaine liposome (EXPAREL) 1.3 % injection 266 mg  20 mL Infiltration Once [name], MD       Chlorhexidine Gluconate Cloth 2 % PADS 6 each  6 each Topical Once [name], MD       And  Chlorhexidine Gluconate Cloth 2 % PADS 6 each  6 each Topical Once Fionnuala Hemmerich, Remo Lipps, MD       feeding supplement (ENSURE PRE-SURGERY) liquid 296 mL  296 mL Oral Once [name], MD         Allergies  Allergen Reactions   Shrimp [Shellfish Allergy] Shortness Of Breath   Nsaids Other (See Comments)    CROHN'S DISEASE = NO NSAIDS    Signed: Morton Peters, MD, FACS, MASCRS Esophageal, Gastrointestinal & Colorectal Surgery Robotic and Minimally Invasive Surgery  [facility], [place]. [address] [place],  [address] [phone] Fax [phone] [address]  CONTACT INFORMATION:  Weekday (9AM-5PM): Call [facility] main office at [phone]  Weeknight (5PM-9AM) or Weekend/Holiday: Check [email or web address] (password " [email or web address]") for General Surgery [facility] coverage  (Please, do not use SecureChat as it is not reliable communication to operating surgeons for immediate patient care)      07/10/2021, 10:02 AM

## 2021-07-10 NOTE — Progress Notes (Signed)
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

## 2021-07-13 ENCOUNTER — Telehealth: Payer: Self-pay

## 2021-07-13 LAB — AEROBIC/ANAEROBIC CULTURE W GRAM STAIN (SURGICAL/DEEP WOUND)

## 2021-07-13 NOTE — Telephone Encounter (Signed)
From the discharge call:   He said he is feeling good, no questions/concerns at this time and he has all of his medications.   He said that the setons remain in place    He said he will call to schedule an appointment if needed.  He has an appointment at a new patient with [facility] in January 2023.     Scheduled to see [facility]- 07/22/2021, Remicaid infusion- 07/24/2021; GI - 08/05/2021. No appointment scheduled with the surgeon yet

## 2021-07-13 NOTE — Telephone Encounter (Signed)
Transition Care Management Follow-up Telephone Call Date of discharge and from where: 07/10/2021, [facility]  How have you been since you were released from the hospital? He said he is feeling good.  Any questions or concerns? No  Items Reviewed: Did the pt receive and understand the discharge instructions provided? Yes  Medications obtained and verified? Yes - he said he has all of his medications Other? No  Any new allergies since your discharge? No  Dietary orders reviewed? Yes Do you have support at home? Yes   Home Care and Equipment/Supplies: Were home health services ordered? no If so, what is the name of the agency? N/a  Has the agency set up a time to come to the patient's home? not applicable Were any new equipment or medical supplies ordered?  No What is the name of the medical supply agency? N/a Were you able to get the supplies/equipment? not applicable Do you have any questions related to the use of the equipment or supplies? No  He said that th setons remain in place   Functional Questionnaire: (I = Independent and D = Dependent) ADLs: independent  Follow up appointments reviewed:  PCP Hospital f/u appt confirmed?  He said he will call to schedule an appointment if needed.  He has an appointment at a new patient with [facility] in January 2023.    [facility] f/u appt confirmed? Yes  Scheduled to see [facility]- 07/22/2021, Remicaid infusion- 07/24/2021; GI - 08/05/2021. No appointment scheduled with the surgeon yet.  Are transportation arrangements needed? No  If their condition worsens, is the pt aware to call PCP or go to the Emergency Dept.? Yes Was the patient provided with contact information for the PCP's office or ED? Yes Was to pt encouraged to call back with questions or concerns? Yes

## 2021-07-22 ENCOUNTER — Encounter: Payer: Self-pay | Admitting: Internal Medicine

## 2021-07-22 ENCOUNTER — Other Ambulatory Visit: Payer: Self-pay

## 2021-07-22 ENCOUNTER — Ambulatory Visit (INDEPENDENT_AMBULATORY_CARE_PROVIDER_SITE_OTHER): Payer: 59 | Admitting: Internal Medicine

## 2021-07-22 VITALS — BP 131/77 | HR 87 | Wt 189.0 lb

## 2021-07-22 DIAGNOSIS — M4628 Osteomyelitis of vertebra, sacral and sacrococcygeal region: Secondary | ICD-10-CM | POA: Diagnosis not present

## 2021-07-22 DIAGNOSIS — K50113 Crohn's disease of large intestine with fistula: Secondary | ICD-10-CM

## 2021-07-22 DIAGNOSIS — K611 Rectal abscess: Secondary | ICD-10-CM | POA: Diagnosis not present

## 2021-07-22 DIAGNOSIS — Z720 Tobacco use: Secondary | ICD-10-CM | POA: Diagnosis not present

## 2021-07-22 NOTE — Progress Notes (Signed)
   Subjective:    Patient ID: Shawn [name], male    DOB: [birth year], 35 y.o.   MRN: [id number]  HPI Here for follow up of his recent surgery.   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  He missed his appointment with surgery but then able to get in to see Dr. Johney Maine on 07/06/21 and on 11/15 underwent urgent modified Hanley procedure and coccygectomy.  He was started on antibiotics with cefdinir and metronidazole for concern of possible residual infection, though bone was completely resected.  He is here for follow up and continues to have pain but no associated rash or diarrhea.  He is scheduled for remicade on Friday.  No complaints otherwise.    Review of Systems  Constitutional:  Negative for chills and fever.  Gastrointestinal:  Negative for nausea.  Skin:  Negative for rash.      Objective:   Physical Exam Eyes:     General: No scleral icterus. Skin:    Findings: No rash.  Neurological:     General: No focal deficit present.     Mental Status: He is alert.  Psychiatric:        Mood and Affect: Mood normal.   SH: + tobacco       Assessment & Plan:

## 2021-07-22 NOTE — Assessment & Plan Note (Signed)
At this point, the bone has been resected and has been on antibiotics for any residual infection.  I will check his inflammatory markers, though will be clouded by his underlying Crohn's disease, and have him continue with antibiotics for now, though I suspect his infection is cured.  He will return in 2 weeks.

## 2021-07-22 NOTE — Assessment & Plan Note (Signed)
Advised to quit.

## 2021-07-22 NOTE — Assessment & Plan Note (Addendum)
At this time, from an ID standpoint, I feel he can proceed with remicade infusions now, as indicated by GI for treatment of this.

## 2021-07-23 LAB — SEDIMENTATION RATE: Sed Rate: 89 mm/h — ABNORMAL HIGH (ref 0–15)

## 2021-07-23 LAB — C-REACTIVE PROTEIN: CRP: 52.7 mg/L — ABNORMAL HIGH (ref <8.0)

## 2021-07-24 ENCOUNTER — Ambulatory Visit (INDEPENDENT_AMBULATORY_CARE_PROVIDER_SITE_OTHER): Payer: 59

## 2021-07-24 ENCOUNTER — Other Ambulatory Visit: Payer: Self-pay

## 2021-07-24 VITALS — BP 122/65 | HR 63 | Temp 98.3°F | Resp 18 | Ht 73.0 in | Wt 188.0 lb

## 2021-07-24 DIAGNOSIS — K50019 Crohn's disease of small intestine with unspecified complications: Secondary | ICD-10-CM | POA: Diagnosis not present

## 2021-07-24 MED ORDER — DIPHENHYDRAMINE HCL 25 MG PO CAPS
25.0000 mg | ORAL_CAPSULE | Freq: Once | ORAL | Status: AC
  Administered 2021-07-24: 25 mg via ORAL
  Filled 2021-07-24: qty 1

## 2021-07-24 MED ORDER — SODIUM CHLORIDE 0.9 % IV SOLN: Freq: Once | INTRAVENOUS | Status: DC | PRN

## 2021-07-24 MED ORDER — METHYLPREDNISOLONE SODIUM SUCC 125 MG IJ SOLR: 125.0000 mg | Freq: Once | INTRAMUSCULAR | Status: DC | PRN

## 2021-07-24 MED ORDER — HYDROCORTISONE NA SUCCINATE PF 100 MG IJ SOLR
100.0000 mg | Freq: Once | INTRAMUSCULAR | Status: AC
  Administered 2021-07-24: 100 mg via INTRAVENOUS
  Filled 2021-07-24: qty 2

## 2021-07-24 MED ORDER — FAMOTIDINE IN NACL 20-0.9 MG/50ML-% IV SOLN: 20.0000 mg | Freq: Once | INTRAVENOUS | Status: DC | PRN

## 2021-07-24 MED ORDER — DIPHENHYDRAMINE HCL 50 MG/ML IJ SOLN: 50.0000 mg | Freq: Once | INTRAMUSCULAR | Status: DC | PRN

## 2021-07-24 MED ORDER — SODIUM CHLORIDE 0.9% FLUSH: 3.0000 mL | Freq: Once | INTRAVENOUS | Status: DC | PRN

## 2021-07-24 MED ORDER — HEPARIN SOD (PORK) LOCK FLUSH 100 UNIT/ML IV SOLN: 250.0000 [IU] | Freq: Once | INTRAVENOUS | Status: DC | PRN

## 2021-07-24 MED ORDER — HEPARIN SOD (PORK) LOCK FLUSH 100 UNIT/ML IV SOLN: 500.0000 [IU] | Freq: Once | INTRAVENOUS | Status: DC | PRN

## 2021-07-24 MED ORDER — SODIUM CHLORIDE 0.9 % IV SOLN
5.0000 mg/kg | Freq: Once | INTRAVENOUS | Status: AC
  Administered 2021-07-24: 400 mg via INTRAVENOUS
  Filled 2021-07-24: qty 40

## 2021-07-24 MED ORDER — ALBUTEROL SULFATE HFA 108 (90 BASE) MCG/ACT IN AERS: 2.0000 | INHALATION_SPRAY | Freq: Once | RESPIRATORY_TRACT | Status: DC | PRN

## 2021-07-24 MED ORDER — SODIUM CHLORIDE 0.9% FLUSH: 10.0000 mL | Freq: Once | INTRAVENOUS | Status: DC | PRN

## 2021-07-24 MED ORDER — EPINEPHRINE 0.3 MG/0.3ML IJ SOAJ: 0.3000 mg | Freq: Once | INTRAMUSCULAR | Status: DC | PRN

## 2021-07-24 MED ORDER — ALTEPLASE 2 MG IJ SOLR: 2.0000 mg | Freq: Once | INTRAMUSCULAR | Status: DC | PRN

## 2021-07-24 MED ORDER — ACETAMINOPHEN 325 MG PO TABS
650.0000 mg | ORAL_TABLET | Freq: Once | ORAL | Status: AC
  Administered 2021-07-24: 650 mg via ORAL
  Filled 2021-07-24: qty 2

## 2021-07-24 MED ORDER — ANTICOAGULANT SODIUM CITRATE 4% (200MG/5ML) IV SOLN
5.0000 mL | Freq: Once | Status: DC | PRN
  Filled 2021-07-24: qty 5

## 2021-07-24 NOTE — Patient Instructions (Signed)
Infliximab injection What is this medication? INFLIXIMAB (in [place] i mab) is used to treat Crohn's disease and ulcerative colitis. It is also used to treat ankylosing spondylitis, plaque psoriasis, and some forms of arthritis. This medicine may be used for other purposes; ask your health care provider or pharmacist if you have questions. COMMON BRAND NAME(S): AVSOLA, INFLECTRA, IXIFI, Remicade, RENFLEXIS What should I tell my care team before I take this medication? They need to know if you have any of these conditions: cancer current or past resident of [place] or [place] diabetes exposure to tuberculosis Guillain-Barre syndrome heart failure hepatitis or liver disease immune system problems infection lung or breathing disease, like COPD multiple sclerosis receiving phototherapy for the skin seizure disorder an unusual or allergic reaction to infliximab, mouse proteins, other medicines, foods, dyes, or preservatives pregnant or trying to get pregnant breast-feeding How should I use this medication? This medicine is for injection into a vein. It is usually given by a health care professional in a hospital or clinic setting. A special MedGuide will be given to you by the pharmacist with each prescription and refill. Be sure to read this information carefully each time. Talk to your pediatrician regarding the use of this medicine in children. While this drug may be prescribed for children as young as 5 years of age for selected conditions, precautions do apply. Overdosage: If you think you have taken too much of this medicine contact a poison control center or emergency room at once. NOTE: This medicine is only for you. Do not share this medicine with others. What if I miss a dose? It is important not to miss your dose. Call your doctor or health care professional if you are unable to keep an appointment. What may interact with this medication? Do not take this medicine  with any of the following medications: biologic medicines such as abatacept, adalimumab, anakinra, certolizumab, etanercept, golimumab, rituximab, secukinumab, tocilizumab, tofactinib, ustekinumab live vaccines This list may not describe all possible interactions. Give your health care provider a list of all the medicines, herbs, non-prescription drugs, or dietary supplements you use. Also tell them if you smoke, drink alcohol, or use illegal drugs. Some items may interact with your medicine. What should I watch for while using this medication? Your condition will be monitored carefully while you are receiving this medicine. Visit your doctor or health care professional for regular checks on your progress. You may need blood work done while you are taking this medicine. Before beginning therapy, your doctor may do a test to see if you have been exposed to tuberculosis. Call your doctor or health care professional for advice if you get a fever, chills or sore throat, or other symptoms of a cold or flu. Do not treat yourself. This drug decreases your body's ability to fight infections. Try to avoid being around people who are sick. This medicine may make the symptoms of heart failure worse in some patients. If you notice symptoms such as increased shortness of breath or swelling of the ankles or legs, contact your health care provider right away. If you are going to have surgery or dental work, tell your health care professional or dentist that you have received this medicine. If you take this medicine for plaque psoriasis, stay out of the sun. If you cannot avoid being in the sun, wear protective clothing and use sunscreen. Do not use sun lamps or tanning beds/booths. Talk to your doctor about your risk of cancer. You may  be more at risk for certain types of cancers if you take this medicine. What side effects may I notice from receiving this medication? Side effects that you should report to your doctor  or health care professional as soon as possible: allergic reactions like skin rash, itching or hives, swelling of the face, lips, or tongue breathing problems changes in vision chest pain fever or chills, usually related to the infusion joint pain pain, tingling, numbness in the hands or feet redness, blistering, peeling or loosening of the skin, including inside the mouth seizures signs of infection - fever or chills, cough, sore throat, flu-like symptoms, pain or difficulty passing urine signs and symptoms of liver injury like dark yellow or brown urine; general ill feeling; light-colored stools; loss of appetite; nausea; right upper belly pain; unusually weak or tired; yellowing of the eyes or skin signs and symptoms of a stroke like changes in vision; confusion; trouble speaking or understanding; severe headaches; sudden numbness or weakness of the face, arm or leg; trouble walking; dizziness; loss of balance or coordination swelling of the ankles, feet, or hands swollen lymph nodes in the neck, underarm, or groin areas unusual bleeding or bruising unusually weak or tired Side effects that usually do not require medical attention (report to your doctor or health care professional if they continue or are bothersome): headache nausea stomach pain upset stomach This list may not describe all possible side effects. Call your doctor for medical advice about side effects. You may report side effects to FDA at 1-800-FDA-1088. Where should I keep my medication? This drug is given in a hospital or clinic and will not be stored at home. NOTE: This sheet is a summary. It may not cover all possible information. If you have questions about this medicine, talk to your doctor, pharmacist, or health care provider.  2022 Elsevier/Gold Standard (2016-09-08 00:00:00)

## 2021-07-24 NOTE — Progress Notes (Signed)
Diagnosis: Crohn's Disease  Provider:  Marshell Garfinkel, MD  Procedure: Infusion  IV Type: Peripheral, IV Location: L Antecubital  Remicade (Infliximab), Dose: 429m  Infusion Start Time: 13014 Infusion Stop Time: 1300  Post Infusion IV Care: Observation period completed and Peripheral IV Discontinued  Discharge: Condition: Good, Destination: Home . AVS provided to patient.   Performed by:  Filippa Yarbough, LSherlon Handing LPN

## 2021-08-05 ENCOUNTER — Encounter: Payer: Self-pay | Admitting: Gastroenterology

## 2021-08-05 ENCOUNTER — Encounter: Payer: Self-pay | Admitting: Internal Medicine

## 2021-08-05 ENCOUNTER — Ambulatory Visit (INDEPENDENT_AMBULATORY_CARE_PROVIDER_SITE_OTHER): Payer: 59 | Admitting: Gastroenterology

## 2021-08-05 ENCOUNTER — Other Ambulatory Visit: Payer: Self-pay

## 2021-08-05 ENCOUNTER — Ambulatory Visit (INDEPENDENT_AMBULATORY_CARE_PROVIDER_SITE_OTHER): Payer: 59 | Admitting: Internal Medicine

## 2021-08-05 ENCOUNTER — Telehealth: Payer: Self-pay | Admitting: Gastroenterology

## 2021-08-05 VITALS — BP 112/76 | HR 99 | Resp 62 | Ht 73.0 in | Wt 194.0 lb

## 2021-08-05 VITALS — BP 128/78 | HR 70 | Temp 97.6°F | Ht 73.0 in | Wt 190.0 lb

## 2021-08-05 DIAGNOSIS — K50113 Crohn's disease of large intestine with fistula: Secondary | ICD-10-CM | POA: Diagnosis not present

## 2021-08-05 DIAGNOSIS — K50019 Crohn's disease of small intestine with unspecified complications: Secondary | ICD-10-CM

## 2021-08-05 DIAGNOSIS — M4628 Osteomyelitis of vertebra, sacral and sacrococcygeal region: Secondary | ICD-10-CM

## 2021-08-05 DIAGNOSIS — K219 Gastro-esophageal reflux disease without esophagitis: Secondary | ICD-10-CM

## 2021-08-05 DIAGNOSIS — R1084 Generalized abdominal pain: Secondary | ICD-10-CM | POA: Diagnosis not present

## 2021-08-05 MED ORDER — PANTOPRAZOLE SODIUM 40 MG PO TBEC: 40.0000 mg | DELAYED_RELEASE_TABLET | Freq: Every day | ORAL | 11 refills | Status: DC

## 2021-08-05 NOTE — Assessment & Plan Note (Signed)
Now on treatment and clinically he is feeling much better.  He will continue with treatments as indicated.

## 2021-08-05 NOTE — Telephone Encounter (Signed)
Please refer him to [email or web address] website for more information about Crohn's disease.

## 2021-08-05 NOTE — Telephone Encounter (Signed)
Called patient, unable to leave voicemail. Sent message from doctor to patient's MyChart.

## 2021-08-05 NOTE — Progress Notes (Signed)
Patient ID: Shawn [name], male   DOB: 18-Jan-1986, 35 y.o.   MRN: [id number]   Subjective:    Patient ID: Shawn [name], male    DOB: [birth year], 35 y.o.   MRN: [id number]  HPI Here for follow up of his recent surgery.   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  He missed his appointment with surgery but then able to get in to see Dr. Michaell Cowing on 07/06/21 and on 11/15 underwent urgent modified Hanley procedure and coccygectomy.  He was started on antibiotics with cefdinir and metronidazole for concern of possible residual infection, though bone was completely resected.   He started remicade 2 weeks ago and is now feeling much better.  He is due for it again on Friday.  He continues to have non-purulent drainage but less.  No fever, no chills.  No rash.     Review of Systems  Constitutional:  Negative for chills, fever and unexpected weight change.  Gastrointestinal:  Negative for nausea.  Skin:  Negative for rash.      Objective:   Physical Exam Eyes:     General: No scleral icterus. Skin:    Findings: No rash.  Neurological:     General: No focal deficit present.     Mental Status: He is alert.  Psychiatric:        Thought Content: Thought content normal.   SH: + tobacco       Assessment & Plan:

## 2021-08-05 NOTE — Patient Instructions (Signed)
We have sent the following medications to your pharmacy for you to pick up at your convenience: pantoprazole.  Patient advised to avoid spicy, acidic, citrus, chocolate, mints, fruit and fruit juices.  Limit the intake of caffeine, alcohol and Soda.  Don't exercise too soon after eating.  Don't lie down within 3-4 hours of eating.  Elevate the head of your bed.  Keep follow up appointment with Dr. Michaell Cowing and keep appointment for Remicade infusion.   Follow up in 3 months.   The [facility] providers would like to encourage you to use [facility] to communicate with providers for non-urgent requests or questions.  Due to long hold times on the telephone, sending your provider a message by [facility] may be a faster and more efficient way to get a response.  Please allow 48 business hours for a response.  Please remember that this is for non-urgent requests.   Thank you for choosing me and [facility].  Venita Lick. Pleas Koch., MD., Clementeen Graham

## 2021-08-05 NOTE — Progress Notes (Signed)
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 He has been followed closely by Dr. Luciana Axe, ID, and Dr. Michaell Cowing, general surgery.  He has been treated with a prolonged course of antibiotics and underwent a Hanley procedure, coccygectomy with placement of setons on November 15.  His infection is felt to be under control and he was cleared to start Remicade by ID and general surgery.  He had his first Remicade infusion on December 2 and his second infusion is due on December 16.  He has mild ongoing rectal pain and frequent bowel movements.  He relates intermittent generalized abdominal discomfort with cramping and frequent heartburn, reflux symptoms.  He notes ongoing fatigue and a decreased appetite.  Current Medications, Allergies, Past Medical History, Past Surgical History, Family History and Social History were reviewed in [employer] record.   Physical Exam: General: Well developed, well nourished, no acute distress Head: Normocephalic and atraumatic Eyes: Sclerae anicteric, EOMI Ears: Normal auditory acuity Mouth: Not examined, mask on during Covid-19 pandemic Lungs: Clear throughout to auscultation Heart: Regular rate and rhythm; no murmurs, rubs or bruits Abdomen: Soft, non tender and non distended. No masses, hepatosplenomegaly or hernias noted. Normal Bowel sounds Rectal: Not done Musculoskeletal: Symmetrical with no gross deformities  Pulses:  Normal pulses noted Extremities: No clubbing, cyanosis, edema or deformities noted Neurological: Alert oriented x 4, grossly nonfocal Psychological:  Alert and cooperative. 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 He underwent Betsy Pries procedure, coccygectomy and setons placed on November 15. Remicade  first dose on December 2nd, Remicade second dose is due on December 16th.  Plan is to continue with his dose at 6 weeks and then every 8 weeks.  We had a long discussion about Crohn's disease management and Remicade, including the risks of biologics and the risks of not treating his Crohn's disease adequately.  We discussed the importance of follow-up with Dr. Michaell Cowing, myself and his Remicade infusions. REV in 3 months.  GERD and abdominal pain.  CT AP performed on June 24, 2019.  Showed wall thickening in the rectum with perirectal inflammation and presacral inflammation.  Numerous bilateral renal cysts were noted.  No additional gastrointestinal pathology was noted.  Trial of pantoprazole 40 mg daily and antireflux measures.  If symptoms persist consider adding dicyclomine 10 mg 3 times daily. REV in 3 months.

## 2021-08-05 NOTE — Telephone Encounter (Signed)
Spoke with patient and he said he just left his appointment and forgot to ask why he got Crohn's. Told patient Crohn's is an autoimmune condition where the immune system attacks the body and can possibly be caused by genetic or environmental factors. Patient wanted to know more information. Told patient I would send a note to his doctor for more information.

## 2021-08-05 NOTE — Assessment & Plan Note (Signed)
Clinically no concerns so I will have him stop antibiotics now and will observe off of antibiotics.  He will return here as needed.

## 2021-08-07 ENCOUNTER — Other Ambulatory Visit: Payer: Self-pay

## 2021-08-07 ENCOUNTER — Ambulatory Visit (INDEPENDENT_AMBULATORY_CARE_PROVIDER_SITE_OTHER): Payer: 59

## 2021-08-07 VITALS — BP 136/90 | HR 61 | Temp 98.1°F | Resp 18 | Ht 73.0 in | Wt 189.4 lb

## 2021-08-07 DIAGNOSIS — K50019 Crohn's disease of small intestine with unspecified complications: Secondary | ICD-10-CM

## 2021-08-07 MED ORDER — FAMOTIDINE IN NACL 20-0.9 MG/50ML-% IV SOLN: 20.0000 mg | Freq: Once | INTRAVENOUS | Status: DC | PRN

## 2021-08-07 MED ORDER — DIPHENHYDRAMINE HCL 25 MG PO CAPS
25.0000 mg | ORAL_CAPSULE | Freq: Once | ORAL | Status: AC
  Administered 2021-08-07: 25 mg via ORAL
  Filled 2021-08-07: qty 1

## 2021-08-07 MED ORDER — HYDROCORTISONE NA SUCCINATE PF 100 MG IJ SOLR
100.0000 mg | Freq: Once | INTRAMUSCULAR | Status: AC
  Administered 2021-08-07: 100 mg via INTRAVENOUS
  Filled 2021-08-07: qty 2

## 2021-08-07 MED ORDER — DIPHENHYDRAMINE HCL 50 MG/ML IJ SOLN: 50.0000 mg | Freq: Once | INTRAMUSCULAR | Status: DC | PRN

## 2021-08-07 MED ORDER — METHYLPREDNISOLONE SODIUM SUCC 125 MG IJ SOLR: 125.0000 mg | Freq: Once | INTRAMUSCULAR | Status: DC | PRN

## 2021-08-07 MED ORDER — SODIUM CHLORIDE 0.9 % IV SOLN
5.0000 mg/kg | Freq: Once | INTRAVENOUS | Status: AC
  Administered 2021-08-07: 400 mg via INTRAVENOUS
  Filled 2021-08-07: qty 40

## 2021-08-07 MED ORDER — SODIUM CHLORIDE 0.9 % IV SOLN: Freq: Once | INTRAVENOUS | Status: DC | PRN

## 2021-08-07 MED ORDER — EPINEPHRINE 0.3 MG/0.3ML IJ SOAJ: 0.3000 mg | Freq: Once | INTRAMUSCULAR | Status: DC | PRN

## 2021-08-07 MED ORDER — ALBUTEROL SULFATE HFA 108 (90 BASE) MCG/ACT IN AERS: 2.0000 | INHALATION_SPRAY | Freq: Once | RESPIRATORY_TRACT | Status: DC | PRN

## 2021-08-07 MED ORDER — ACETAMINOPHEN 325 MG PO TABS
650.0000 mg | ORAL_TABLET | Freq: Once | ORAL | Status: AC
  Administered 2021-08-07: 650 mg via ORAL
  Filled 2021-08-07: qty 2

## 2021-08-07 NOTE — Progress Notes (Signed)
Diagnosis: Crohn's Disease  Provider:  Marshell Garfinkel, MD  Procedure: Infusion  IV Type: Peripheral, IV Location: L Antecubital  Remicade (Infliximab), Dose: 400  Infusion Start Time: 1011  Infusion Stop Time: 1225  Post Infusion IV Care: Peripheral IV Discontinued  Discharge: Condition: Good, Destination: Home . AVS provided to patient.   Performed by:  Charlie Pitter, RN

## 2021-08-28 ENCOUNTER — Encounter: Payer: Self-pay | Admitting: Gastroenterology

## 2021-09-04 ENCOUNTER — Other Ambulatory Visit: Payer: Self-pay

## 2021-09-04 ENCOUNTER — Ambulatory Visit (INDEPENDENT_AMBULATORY_CARE_PROVIDER_SITE_OTHER): Payer: 59 | Admitting: *Deleted

## 2021-09-04 VITALS — BP 138/96 | HR 56 | Temp 98.1°F | Resp 16 | Ht 73.0 in | Wt 200.6 lb

## 2021-09-04 DIAGNOSIS — K50019 Crohn's disease of small intestine with unspecified complications: Secondary | ICD-10-CM | POA: Diagnosis not present

## 2021-09-04 MED ORDER — METHYLPREDNISOLONE SODIUM SUCC 125 MG IJ SOLR: 125.0000 mg | Freq: Once | INTRAMUSCULAR | Status: DC | PRN

## 2021-09-04 MED ORDER — EPINEPHRINE 0.3 MG/0.3ML IJ SOAJ: 0.3000 mg | Freq: Once | INTRAMUSCULAR | Status: DC | PRN

## 2021-09-04 MED ORDER — ACETAMINOPHEN 325 MG PO TABS
650.0000 mg | ORAL_TABLET | Freq: Once | ORAL | Status: AC
  Administered 2021-09-04: 650 mg via ORAL
  Filled 2021-09-04: qty 2

## 2021-09-04 MED ORDER — DIPHENHYDRAMINE HCL 50 MG/ML IJ SOLN: 50.0000 mg | Freq: Once | INTRAMUSCULAR | Status: DC | PRN

## 2021-09-04 MED ORDER — FAMOTIDINE IN NACL 20-0.9 MG/50ML-% IV SOLN: 20.0000 mg | Freq: Once | INTRAVENOUS | Status: DC | PRN

## 2021-09-04 MED ORDER — HYDROCORTISONE NA SUCCINATE PF 100 MG IJ SOLR
100.0000 mg | Freq: Once | INTRAMUSCULAR | Status: AC
  Administered 2021-09-04: 100 mg via INTRAVENOUS
  Filled 2021-09-04: qty 2

## 2021-09-04 MED ORDER — SODIUM CHLORIDE 0.9 % IV SOLN: 5.0000 mg/kg | Freq: Once | INTRAVENOUS | Status: DC

## 2021-09-04 MED ORDER — SODIUM CHLORIDE 0.9 % IV SOLN
5.0000 mg/kg | Freq: Once | INTRAVENOUS | Status: AC
  Administered 2021-09-04: 500 mg via INTRAVENOUS
  Filled 2021-09-04: qty 50

## 2021-09-04 MED ORDER — DIPHENHYDRAMINE HCL 25 MG PO CAPS
25.0000 mg | ORAL_CAPSULE | Freq: Once | ORAL | Status: AC
  Administered 2021-09-04: 25 mg via ORAL
  Filled 2021-09-04: qty 1

## 2021-09-04 MED ORDER — ALBUTEROL SULFATE HFA 108 (90 BASE) MCG/ACT IN AERS: 2.0000 | INHALATION_SPRAY | Freq: Once | RESPIRATORY_TRACT | Status: DC | PRN

## 2021-09-04 MED ORDER — SODIUM CHLORIDE 0.9 % IV SOLN: Freq: Once | INTRAVENOUS | Status: DC | PRN

## 2021-09-04 NOTE — Progress Notes (Signed)
Diagnosis: Crohn's Disease  Provider:  Marshell Garfinkel, MD  Procedure: Infusion  IV Type: Peripheral, IV Location: L Antecubital  Remicade (Infliximab), Dose: 500 mg  Infusion Start Time: 1050 am  Infusion Stop Time: 1255 pm  Post Infusion IV Care: Observation period completed and Peripheral IV Discontinued  Discharge: Condition: Good, Destination: Home . AVS provided to patient.   Performed by:  Oren Beckmann, RN

## 2021-09-08 ENCOUNTER — Encounter: Payer: Self-pay | Admitting: Gastroenterology

## 2021-09-11 ENCOUNTER — Telehealth: Payer: Self-pay | Admitting: Family Medicine

## 2021-09-11 ENCOUNTER — Ambulatory Visit: Payer: 59 | Admitting: Family Medicine

## 2021-09-11 NOTE — Telephone Encounter (Signed)
Pt was a no show for his appointment for 09/11/21.

## 2021-09-17 ENCOUNTER — Encounter: Payer: Self-pay | Admitting: Family Medicine

## 2021-09-17 NOTE — Telephone Encounter (Signed)
1st no show on new pt visit, letter sent, fee waived

## 2021-09-20 ENCOUNTER — Other Ambulatory Visit: Payer: Self-pay | Admitting: Pharmacy Technician

## 2021-09-24 ENCOUNTER — Other Ambulatory Visit: Payer: Self-pay | Admitting: Family Medicine

## 2021-10-30 ENCOUNTER — Ambulatory Visit (INDEPENDENT_AMBULATORY_CARE_PROVIDER_SITE_OTHER): Payer: 59

## 2021-10-30 ENCOUNTER — Other Ambulatory Visit: Payer: Self-pay

## 2021-10-30 VITALS — BP 129/85 | HR 57 | Temp 97.8°F | Resp 18 | Ht 73.0 in | Wt 199.6 lb

## 2021-10-30 DIAGNOSIS — K50019 Crohn's disease of small intestine with unspecified complications: Secondary | ICD-10-CM

## 2021-10-30 MED ORDER — ALBUTEROL SULFATE HFA 108 (90 BASE) MCG/ACT IN AERS: 2.0000 | INHALATION_SPRAY | Freq: Once | RESPIRATORY_TRACT | Status: DC | PRN

## 2021-10-30 MED ORDER — FAMOTIDINE IN NACL 20-0.9 MG/50ML-% IV SOLN: 20.0000 mg | Freq: Once | INTRAVENOUS | Status: DC | PRN

## 2021-10-30 MED ORDER — METHYLPREDNISOLONE SODIUM SUCC 125 MG IJ SOLR: 125.0000 mg | Freq: Once | INTRAMUSCULAR | Status: DC | PRN

## 2021-10-30 MED ORDER — HYDROCORTISONE NA SUCCINATE PF 100 MG IJ SOLR
100.0000 mg | Freq: Once | INTRAMUSCULAR | Status: AC
  Administered 2021-10-30: 100 mg via INTRAVENOUS
  Filled 2021-10-30: qty 2

## 2021-10-30 MED ORDER — DIPHENHYDRAMINE HCL 25 MG PO CAPS
25.0000 mg | ORAL_CAPSULE | Freq: Once | ORAL | Status: AC
  Administered 2021-10-30: 25 mg via ORAL
  Filled 2021-10-30: qty 1

## 2021-10-30 MED ORDER — EPINEPHRINE 0.3 MG/0.3ML IJ SOAJ: 0.3000 mg | Freq: Once | INTRAMUSCULAR | Status: DC | PRN

## 2021-10-30 MED ORDER — SODIUM CHLORIDE 0.9 % IV SOLN: Freq: Once | INTRAVENOUS | Status: DC | PRN

## 2021-10-30 MED ORDER — ACETAMINOPHEN 325 MG PO TABS
650.0000 mg | ORAL_TABLET | Freq: Once | ORAL | Status: AC
  Administered 2021-10-30: 650 mg via ORAL
  Filled 2021-10-30: qty 2

## 2021-10-30 MED ORDER — DIPHENHYDRAMINE HCL 50 MG/ML IJ SOLN: 50.0000 mg | Freq: Once | INTRAMUSCULAR | Status: DC | PRN

## 2021-10-30 MED ORDER — SODIUM CHLORIDE 0.9 % IV SOLN
5.0000 mg/kg | Freq: Once | INTRAVENOUS | Status: AC
  Administered 2021-10-30: 500 mg via INTRAVENOUS
  Filled 2021-10-30: qty 50

## 2021-10-30 NOTE — Progress Notes (Signed)
Diagnosis: Crohn's Disease ? ?Provider:  Marshell Garfinkel, MD ? ?Procedure: Infusion ? ?IV Type: Peripheral, IV Location: R Hand ? ?Remicade (Infliximab), Dose: 500 mg ? ?Infusion Start Time: 10.43 ? ?Infusion Stop Time: 13.08 ? ?Post Infusion IV Care: Peripheral IV Discontinued ? ?Discharge: Condition: Good, Destination: Home . AVS provided to patient.  ? ?Performed by:  Tazia Illescas, RN  ?  ?

## 2021-12-18 ENCOUNTER — Telehealth: Payer: Self-pay | Admitting: Pharmacy Technician

## 2021-12-18 NOTE — Telephone Encounter (Signed)
Remicade co-pay card: APPROVED ? ?ID: [id number] ?GR: [id number] ?BIN: [id number] ?FAX CLAIMS/EOB: [phone] ?EXP: 08/22/22 ?$20,000/YR ? ?

## 2021-12-21 ENCOUNTER — Encounter: Payer: Self-pay | Admitting: Gastroenterology

## 2021-12-28 ENCOUNTER — Ambulatory Visit (INDEPENDENT_AMBULATORY_CARE_PROVIDER_SITE_OTHER): Payer: Commercial Managed Care - HMO

## 2021-12-28 VITALS — BP 145/93 | HR 66 | Temp 98.1°F | Resp 18 | Ht 73.0 in | Wt 194.4 lb

## 2021-12-28 DIAGNOSIS — K50019 Crohn's disease of small intestine with unspecified complications: Secondary | ICD-10-CM

## 2021-12-28 MED ORDER — SODIUM CHLORIDE 0.9 % IV SOLN
5.0000 mg/kg | Freq: Once | INTRAVENOUS | Status: AC
  Administered 2021-12-28: 400 mg via INTRAVENOUS
  Filled 2021-12-28: qty 40

## 2021-12-28 MED ORDER — DIPHENHYDRAMINE HCL 25 MG PO CAPS
25.0000 mg | ORAL_CAPSULE | Freq: Once | ORAL | Status: AC
  Administered 2021-12-28: 25 mg via ORAL
  Filled 2021-12-28: qty 1

## 2021-12-28 MED ORDER — ACETAMINOPHEN 325 MG PO TABS
650.0000 mg | ORAL_TABLET | Freq: Once | ORAL | Status: AC
  Administered 2021-12-28: 650 mg via ORAL
  Filled 2021-12-28: qty 2

## 2021-12-28 MED ORDER — HYDROCORTISONE NA SUCCINATE PF 100 MG IJ SOLR
100.0000 mg | Freq: Once | INTRAMUSCULAR | Status: AC
  Administered 2021-12-28: 100 mg via INTRAVENOUS
  Filled 2021-12-28: qty 2

## 2021-12-28 NOTE — Progress Notes (Signed)
Diagnosis: Crohn's Disease ? ?Provider:  Marshell Garfinkel, MD ? ?Procedure: Infusion ? ?IV Type: Peripheral, IV Location: L Forearm ? ?Remicade (Infliximab), Dose: 453m ? ?Infusion Start Time: 18250? ?Infusion Stop Time: 15397? ?Post Infusion IV Care: Peripheral IV Discontinued ? ?Discharge: Condition: Good, Destination: Home . AVS provided to patient.  ? ?Performed by:  LAdelina Mings LPN  ?  ?

## 2022-01-11 ENCOUNTER — Telehealth: Payer: Self-pay | Admitting: Pharmacy Technician

## 2022-01-11 NOTE — Telephone Encounter (Signed)
Patient has new insurance, which will need to be scanned at next appt 02/26/22.  Will need to submit a reto-auth for treatment on 12/28/21.  [employer]: Phone: [phone] Id: [id number] Gr: [phone] Pcn: [place]: [id number].  Auth Submission: remicade (pending) Payer: [employer] Medication & CPT/J Code(s) submitted: Remicade (Infliximab) J1745 Route of submission (phone, fax, portal): phone Auth type: Buy/Bill Units/visits requested: 4m/kg Reference number:  Approval from:  to  at [facility]    Co-pay card: APPROVED. Card id: [id number]: 4/26 Cvv 235

## 2022-01-14 ENCOUNTER — Other Ambulatory Visit: Payer: Self-pay

## 2022-01-14 ENCOUNTER — Emergency Department (HOSPITAL_COMMUNITY): Payer: Commercial Managed Care - HMO

## 2022-01-14 ENCOUNTER — Encounter: Payer: Self-pay | Admitting: Gastroenterology

## 2022-01-14 ENCOUNTER — Encounter (HOSPITAL_COMMUNITY): Payer: Self-pay

## 2022-01-14 ENCOUNTER — Inpatient Hospital Stay (HOSPITAL_COMMUNITY)
Admission: EM | Admit: 2022-01-14 | Discharge: 2022-01-16 | DRG: 386 | Disposition: A | Discharge status: Home / Self Care | Payer: Commercial Managed Care - HMO | Attending: Surgery | Admitting: Surgery

## 2022-01-14 DIAGNOSIS — D631 Anemia in chronic kidney disease: Secondary | ICD-10-CM | POA: Diagnosis present

## 2022-01-14 DIAGNOSIS — L02215 Cutaneous abscess of perineum: Secondary | ICD-10-CM

## 2022-01-14 DIAGNOSIS — K605 Anorectal fistula, unspecified: Secondary | ICD-10-CM | POA: Diagnosis present

## 2022-01-14 DIAGNOSIS — K604 Rectal fistula, unspecified: Secondary | ICD-10-CM | POA: Diagnosis present

## 2022-01-14 DIAGNOSIS — Z886 Allergy status to analgesic agent status: Secondary | ICD-10-CM

## 2022-01-14 DIAGNOSIS — D638 Anemia in other chronic diseases classified elsewhere: Secondary | ICD-10-CM | POA: Diagnosis present

## 2022-01-14 DIAGNOSIS — N183 Chronic kidney disease, stage 3 unspecified: Secondary | ICD-10-CM | POA: Diagnosis present

## 2022-01-14 DIAGNOSIS — I129 Hypertensive chronic kidney disease with stage 1 through stage 4 chronic kidney disease, or unspecified chronic kidney disease: Secondary | ICD-10-CM | POA: Diagnosis present

## 2022-01-14 DIAGNOSIS — M869 Osteomyelitis, unspecified: Secondary | ICD-10-CM

## 2022-01-14 DIAGNOSIS — K611 Rectal abscess: Secondary | ICD-10-CM | POA: Diagnosis present

## 2022-01-14 DIAGNOSIS — K6289 Other specified diseases of anus and rectum: Secondary | ICD-10-CM | POA: Diagnosis present

## 2022-01-14 DIAGNOSIS — F1010 Alcohol abuse, uncomplicated: Secondary | ICD-10-CM | POA: Diagnosis not present

## 2022-01-14 DIAGNOSIS — I10 Essential (primary) hypertension: Secondary | ICD-10-CM | POA: Diagnosis present

## 2022-01-14 DIAGNOSIS — K50113 Crohn's disease of large intestine with fistula: Secondary | ICD-10-CM

## 2022-01-14 DIAGNOSIS — I420 Dilated cardiomyopathy: Secondary | ICD-10-CM | POA: Diagnosis present

## 2022-01-14 DIAGNOSIS — K50913 Crohn's disease, unspecified, with fistula: Secondary | ICD-10-CM | POA: Diagnosis not present

## 2022-01-14 DIAGNOSIS — Z91013 Allergy to seafood: Secondary | ICD-10-CM

## 2022-01-14 DIAGNOSIS — Z20822 Contact with and (suspected) exposure to covid-19: Secondary | ICD-10-CM | POA: Diagnosis present

## 2022-01-14 DIAGNOSIS — N1831 Chronic kidney disease, stage 3a: Secondary | ICD-10-CM

## 2022-01-14 LAB — URINALYSIS, ROUTINE W REFLEX MICROSCOPIC
Bilirubin Urine: NEGATIVE
Glucose, UA: NEGATIVE mg/dL
Hgb urine dipstick: NEGATIVE
Ketones, ur: NEGATIVE mg/dL
Leukocytes,Ua: NEGATIVE
Nitrite: NEGATIVE
Protein, ur: NEGATIVE mg/dL
Specific Gravity, Urine: 1.019 (ref 1.005–1.030)
pH: 6 (ref 5.0–8.0)

## 2022-01-14 LAB — CBC WITH DIFFERENTIAL/PLATELET
Abs Immature Granulocytes: 0.03 10*3/uL (ref 0.00–0.07)
Basophils Absolute: 0.1 10*3/uL (ref 0.0–0.1)
Basophils Relative: 1 %
Eosinophils Absolute: 0.5 10*3/uL (ref 0.0–0.5)
Eosinophils Relative: 5 %
HCT: 34 % — ABNORMAL LOW (ref 39.0–52.0)
Hemoglobin: 11.5 g/dL — ABNORMAL LOW (ref 13.0–17.0)
Immature Granulocytes: 0 %
Lymphocytes Relative: 30 %
Lymphs Abs: 2.9 10*3/uL (ref 0.7–4.0)
MCH: 35 pg — ABNORMAL HIGH (ref 26.0–34.0)
MCHC: 33.8 g/dL (ref 30.0–36.0)
MCV: 103.3 fL — ABNORMAL HIGH (ref 80.0–100.0)
Monocytes Absolute: 0.6 10*3/uL (ref 0.1–1.0)
Monocytes Relative: 6 %
Neutro Abs: 5.8 10*3/uL (ref 1.7–7.7)
Neutrophils Relative %: 58 %
Platelets: 310 10*3/uL (ref 150–400)
RBC: 3.29 MIL/uL — ABNORMAL LOW (ref 4.22–5.81)
RDW: 15.7 % — ABNORMAL HIGH (ref 11.5–15.5)
WBC: 9.8 10*3/uL (ref 4.0–10.5)
nRBC: 0 % (ref 0.0–0.2)

## 2022-01-14 LAB — RESP PANEL BY RT-PCR (FLU A&B, COVID) ARPGX2
Influenza A by PCR: NEGATIVE
Influenza B by PCR: NEGATIVE
SARS Coronavirus 2 by RT PCR: NEGATIVE

## 2022-01-14 LAB — COMPREHENSIVE METABOLIC PANEL
ALT: 22 U/L (ref 0–44)
AST: 17 U/L (ref 15–41)
Albumin: 3.8 g/dL (ref 3.5–5.0)
Alkaline Phosphatase: 112 U/L (ref 38–126)
Anion gap: 5 (ref 5–15)
BUN: 21 mg/dL — ABNORMAL HIGH (ref 6–20)
CO2: 22 mmol/L (ref 22–32)
Calcium: 8.6 mg/dL — ABNORMAL LOW (ref 8.9–10.3)
Chloride: 112 mmol/L — ABNORMAL HIGH (ref 98–111)
Creatinine, Ser: 1.25 mg/dL — ABNORMAL HIGH (ref 0.61–1.24)
GFR, Estimated: >60 mL/min (ref >60)
Glucose, Bld: 93 mg/dL (ref 70–99)
Potassium: 5 mmol/L (ref 3.5–5.1)
Sodium: 139 mmol/L (ref 135–145)
Total Bilirubin: 0.5 mg/dL (ref 0.3–1.2)
Total Protein: 8 g/dL (ref 6.5–8.1)

## 2022-01-14 IMAGING — CT CT ABD-PELV W/ CM
2 of 5 series · 15 of 46 positions shown, 17 images · IV contrast (APPLIED)
Comparison: Multiple priors including most recent CT [DATE].

CLINICAL DATA: Concern for Crohn's exacerbation.

EXAM:
CT ABDOMEN AND PELVIS WITH CONTRAST
TECHNIQUE: Multidetector CT imaging of the abdomen and pelvis was performed
using the standard protocol following bolus administration of
intravenous contrast.

[Series 2: axial st · axial · 0.90mm/px · z∈[-556,-131]mm · 12 of 101 slices shown, 14 images]
[im 8/101  soft-tissue]
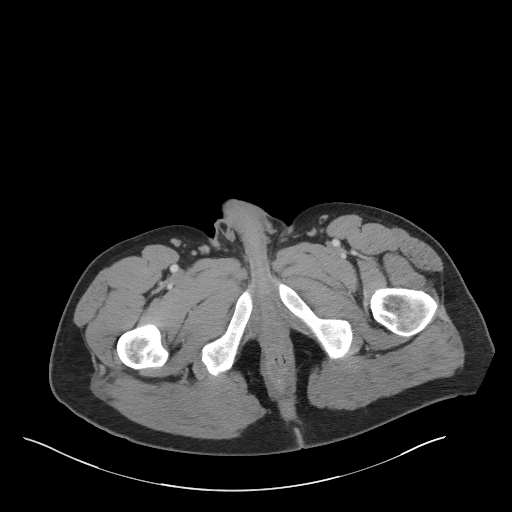
[im 8/101  bone]
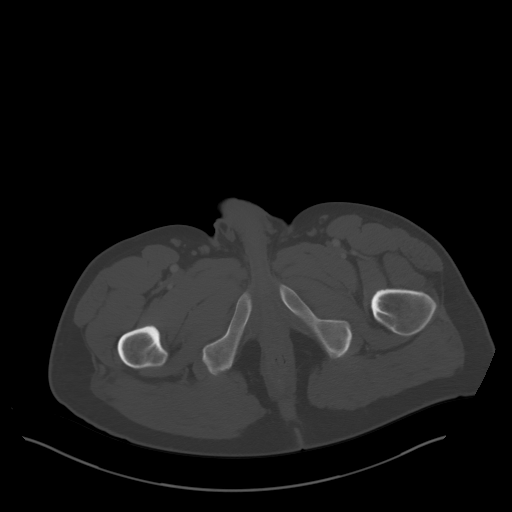
[im 15/101  soft-tissue]
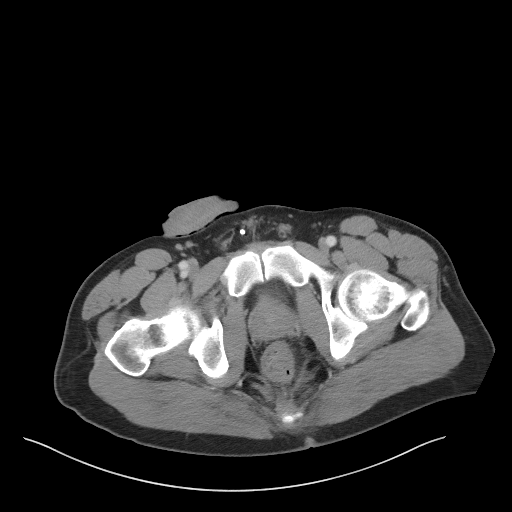
[im 22/101  soft-tissue]
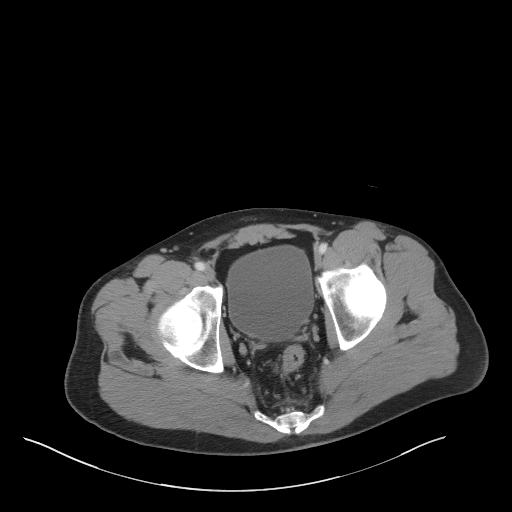
[im 29/101  soft-tissue]
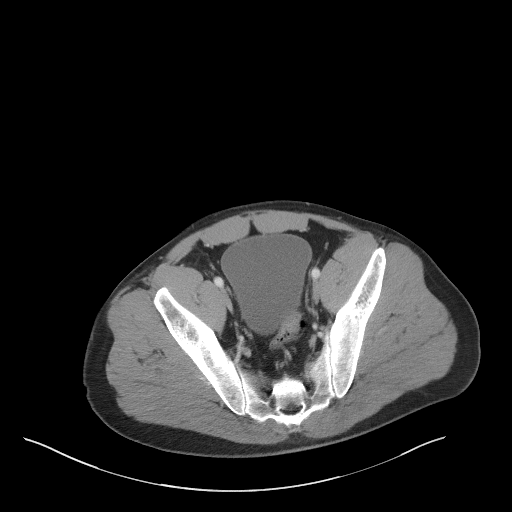
[im 36/101  soft-tissue]
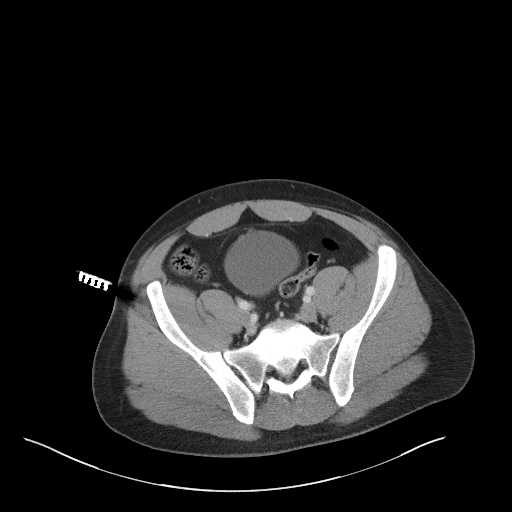
[im 43/101  soft-tissue]
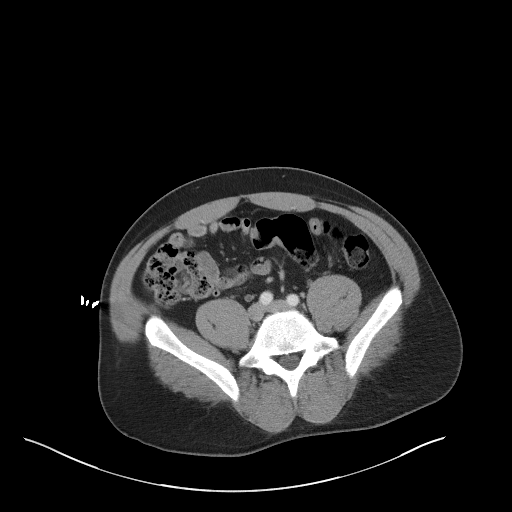
[im 58/101  soft-tissue]
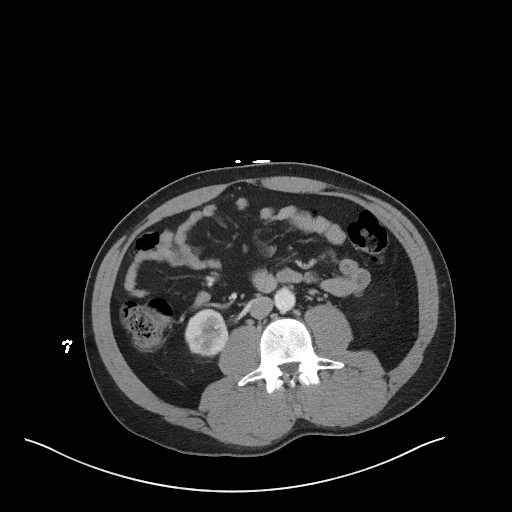
[im 65/101  soft-tissue]
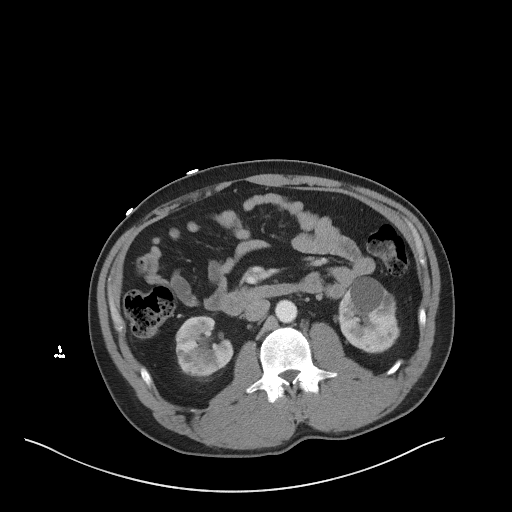
[im 72/101  soft-tissue]
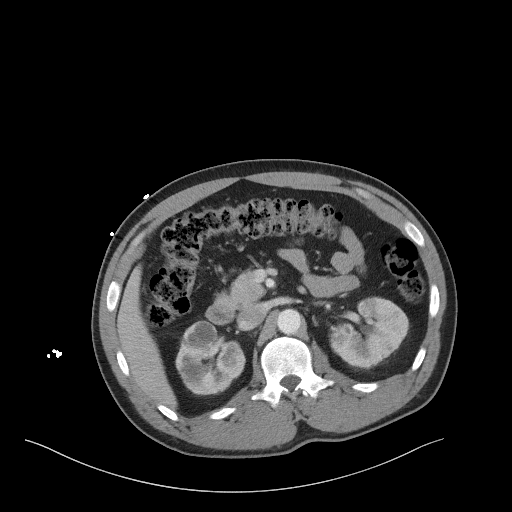
[im 72/101  bone]
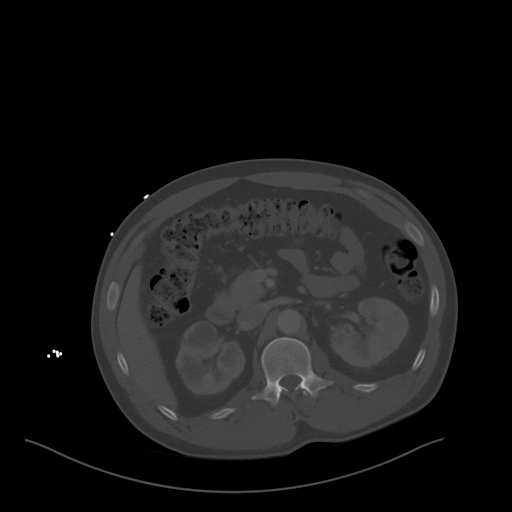
[im 79/101  soft-tissue]
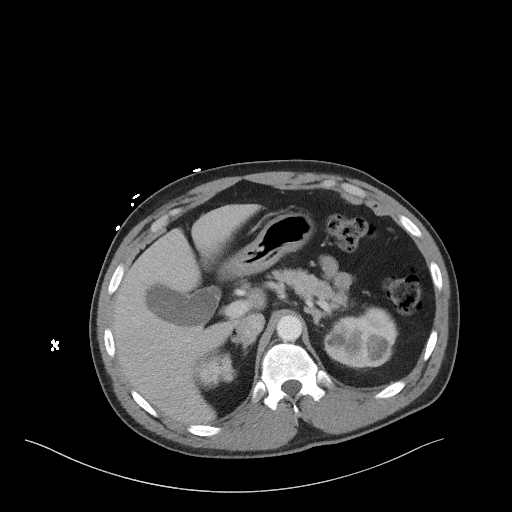
[im 86/101  soft-tissue]
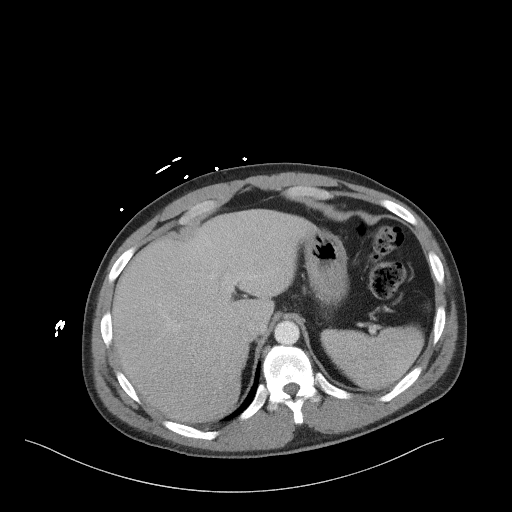
[im 93/101  soft-tissue]
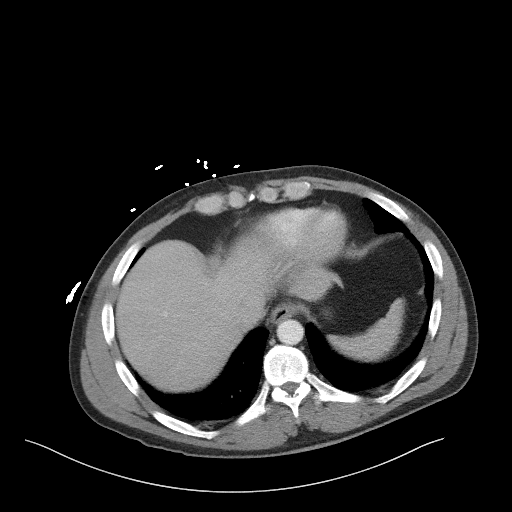

[Series 7: coronal st · coronal · 0.77mm/px · 3 of 103 slices shown]
[im 35/103  soft-tissue]
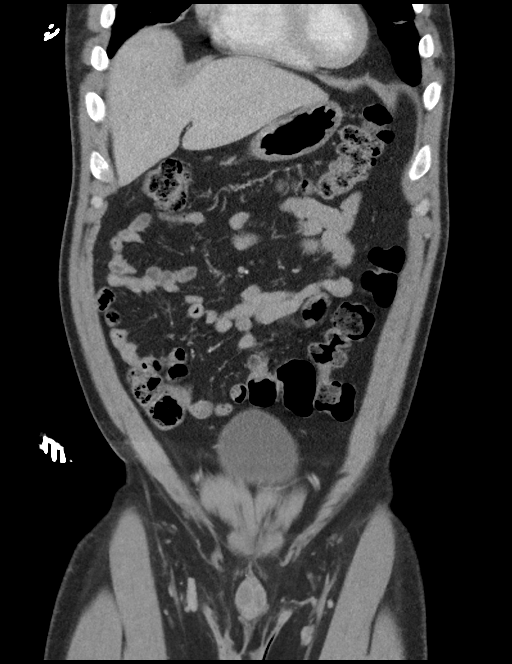
[im 46/103  soft-tissue]
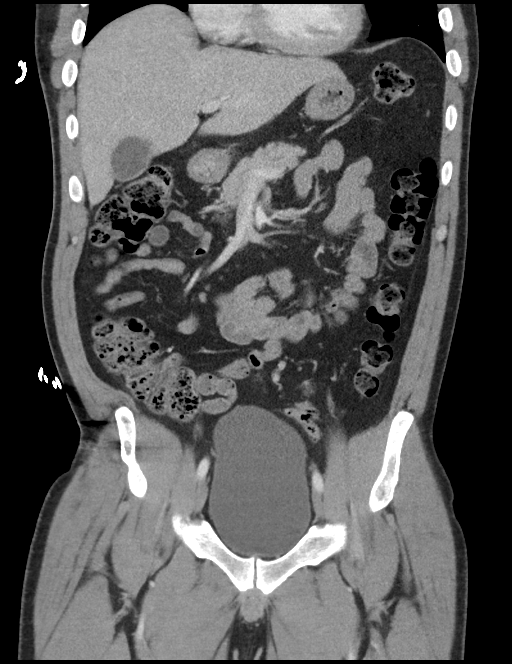
[im 57/103  soft-tissue]
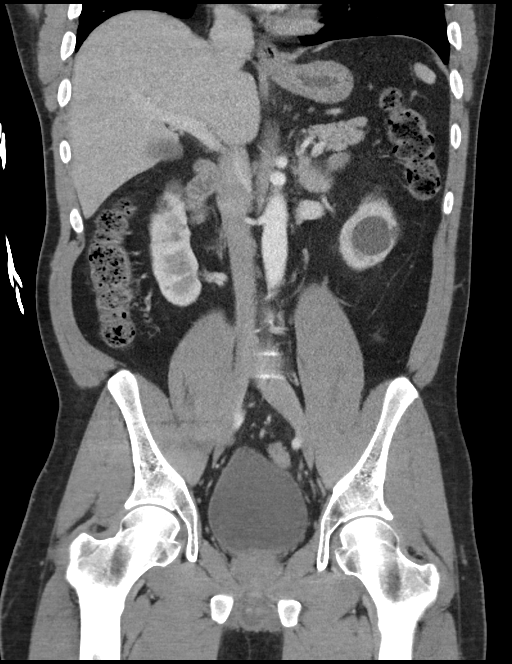

[15 of 46 positions shown; findings below may reference images not displayed]

RADIATION DOSE REDUCTION: This exam was performed according to the
departmental dose-optimization program which includes automated
exposure control, adjustment of the mA and/or kV according to
patient size and/or use of iterative reconstruction technique.

CONTRAST:  100mL OMNIPAQUE IOHEXOL 300 MG/ML  SOLN
FINDINGS: Lower chest: Hypoventilatory change in the lung bases.

Hepatobiliary: No suspicious hepatic lesion. Gallbladder is
unremarkable. No biliary ductal dilation.

Pancreas: No pancreatic ductal dilation or evidence of acute
inflammation.

Spleen: No splenomegaly or focal splenic lesion.

Adrenals/Urinary Tract: Bilateral adrenal glands appear normal. No
hydronephrosis. Kidneys demonstrate symmetric enhancement. Similar
appearance of the bilateral renal cysts and renal lesions which are
technically too small to accurately characterize but statistically
likely to reflect cysts which in the absence of clinically indicated
signs/symptoms require no independent follow-up. Urinary bladder is
unremarkable for degree of distension.

Stomach/Bowel: No radiopaque enteric contrast material was
administered. Stomach is unremarkable for degree of distension. No
pathologic dilation of small or large bowel. The appendix and
terminal ileum appear normal.

Rectal wall thickening with perirectal inflammation and multiple
punctate foci of gas contained within an area of inflammatory soft
tissue posterior to the rectum extending superiorly to the inferior
aspect of the coccyx and inferior to the gluteal crease measuring
approximally 4.1 x 2.2 cm on image 90/2 with a soft tissue tract
extending to the anal verge.

Soft tissue tract extending from the anterior aspect of the anus
into a tiny fluid collection in the anterior peritoneum measuring
approximally 1.3 cm on image 99/2.

Vascular/Lymphatic: Normal caliber abdominal aorta. No
pathologically enlarged abdominal or pelvic lymph nodes.

Reproductive: Prostate is unremarkable.

Other: No significant abdominopelvic free fluid.

Musculoskeletal: Similar destructive erosion of the lower coccyx.
IMPRESSION: Rectal wall thickening with perirectal inflammation and in the soft
tissue tract extending from the anus to multiple punctate foci of
gas contained within an area of inflammatory soft tissue posterior
to the rectum measuring approximally 4.1 x 2.2 cm, suggestive of a
fistula with developing perirectal/perianal abscess. Additional soft
tissue tract extending from the anterior aspect of the anus into a
tiny fluid collection in the anterior peritoneum measuring
approximally 1.3 cm also suggestive of a fistula with perineal
abscess. These findings could be more definitively characterized by
perianal fistula protocol pelvic MRI with and without contrast.
Consider surgical consult.

## 2022-01-14 MED ORDER — FENTANYL CITRATE PF 50 MCG/ML IJ SOSY: 50.0000 ug | PREFILLED_SYRINGE | Freq: Once | INTRAMUSCULAR | Status: DC

## 2022-01-14 MED ORDER — PIPERACILLIN-TAZOBACTAM 3.375 G IVPB
3.3750 g | Freq: Once | INTRAVENOUS | Status: AC
  Administered 2022-01-14: 3.375 g via INTRAVENOUS
  Filled 2022-01-14: qty 50

## 2022-01-14 MED ORDER — SODIUM CHLORIDE 0.9 % IV SOLN: INTRAVENOUS | Status: DC | PRN

## 2022-01-14 MED ORDER — MORPHINE SULFATE (PF) 4 MG/ML IV SOLN
4.0000 mg | Freq: Once | INTRAVENOUS | Status: AC
  Administered 2022-01-14: 4 mg via INTRAVENOUS
  Filled 2022-01-14: qty 1

## 2022-01-14 MED ORDER — IOHEXOL 300 MG/ML  SOLN
100.0000 mL | Freq: Once | INTRAMUSCULAR | Status: AC | PRN
  Administered 2022-01-14: 100 mL via INTRAVENOUS

## 2022-01-14 MED ORDER — SODIUM CHLORIDE 0.9 % IV BOLUS
1000.0000 mL | Freq: Once | INTRAVENOUS | Status: AC
  Administered 2022-01-14: 1000 mL via INTRAVENOUS

## 2022-01-14 MED ORDER — OXYCODONE HCL 5 MG PO TABS: 10.0000 mg | ORAL_TABLET | Freq: Once | ORAL | Status: DC

## 2022-01-14 MED ORDER — ONDANSETRON HCL 4 MG/2ML IJ SOLN
4.0000 mg | Freq: Once | INTRAMUSCULAR | Status: AC
  Administered 2022-01-14: 4 mg via INTRAVENOUS
  Filled 2022-01-14: qty 2

## 2022-01-14 NOTE — ED Provider Notes (Signed)
[facility] DEPT Provider Note   CSN: [id number] Arrival date & time: 01/14/22  1537     History  Chief Complaint  Patient presents with   Tailbone Pain    Shawn [name] is a 36 y.o. male.  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  He is on Remicade.  He had a modified [place] procedure by Dr. Johney Maine with the last seton removed on 12/29/21.  Pt did see Dr. Dema Severin (surgery) today because he's had perianal and coccyx pain.  He was sent over here by Dr. Dema Severin for a CT of his pelvis for possible deep space abscess.  Pt is followed by Dr. Fuller Plan ([facility]) for GI.       Home Medications Prior to Admission medications   Medication Sig Start Date End Date Taking? Authorizing Provider  oxyCODONE-acetaminophen (PERCOCET) 7.5-325 MG tablet Take 1 tablet by mouth in the morning, at noon, in the evening, and at bedtime.   Yes [provider]  acetaminophen (TYLENOL) 500 MG tablet Take 2 tablets (1,000 mg total) by mouth every 6 (six) hours. Patient not taking: Reported on 01/14/2022 04/22/21   Nita Sells, MD  DULoxetine (CYMBALTA) 60 MG capsule Take 1 capsule (60 mg total) by mouth daily. For tail bone pain Patient not taking: Reported on 08/05/2021 05/27/21   Charlott Rakes, MD  multivitamin-iron-minerals-folic acid (CENTRUM) chewable tablet Chew 1 tablet by mouth daily. Patient not taking: Reported on 08/05/2021 07/10/21   [name], MD  pantoprazole (PROTONIX) 40 MG tablet Take 1 tablet (40 mg total) by mouth daily. Patient not taking: Reported on 01/14/2022 08/05/21   Ladene Artist, MD  polyethylene glycol powder [facility]) 17 GM/SCOOP powder Take 17 g by mouth daily. Patient not taking: Reported on 01/14/2022 05/27/21   Charlott Rakes, MD  vitamin B-12 1000 MCG tablet Take 1 tablet (1,000 mcg total) by mouth daily. Patient not taking: Reported on 01/14/2022 07/10/21   [name], MD       Allergies    Shrimp [shellfish allergy] and Nsaids    Review of Systems   Review of Systems  Gastrointestinal:  Positive for abdominal pain.       Perianal pain  Musculoskeletal:  Positive for back pain.  All other systems reviewed and are negative.  Physical Exam Updated Vital Signs BP 118/85   Pulse (!) 56   Temp 98.1 F (36.7 C) (Oral)   Resp 11   Ht 6' 1"  (1.854 m)   Wt 89.4 kg   SpO2 100%   BMI 25.99 kg/m  Physical Exam Vitals and nursing note reviewed.  Constitutional:      Appearance: Normal appearance.  HENT:     Head: Normocephalic and atraumatic.     Right Ear: External ear normal.     Left Ear: External ear normal.     Nose: Nose normal.     Mouth/Throat:     Mouth: Mucous membranes are moist.     Pharynx: Oropharynx is clear.  Eyes:     Extraocular Movements: Extraocular movements intact.     Conjunctiva/sclera: Conjunctivae normal.     Pupils: Pupils are equal, round, and reactive to light.  Cardiovascular:     Rate and Rhythm: Normal rate and regular rhythm.     Pulses: Normal pulses.     Heart sounds: Normal heart sounds.  Pulmonary:     Effort: Pulmonary effort is normal.  Breath sounds: Normal breath sounds.  Abdominal:     General: Abdomen is flat. Bowel sounds are normal.     Palpations: Abdomen is soft.  Musculoskeletal:     Cervical back: Normal range of motion and neck supple.       Back:  Skin:    General: Skin is warm.     Capillary Refill: Capillary refill takes less than 2 seconds.  Neurological:     General: No focal deficit present.     Mental Status: He is alert and oriented to person, place, and time.  Psychiatric:        Mood and Affect: Mood normal.        Behavior: Behavior normal.    ED Results / Procedures / Treatments   Labs (all labs ordered are listed, but only abnormal results are displayed) Labs Reviewed  CBC WITH DIFFERENTIAL/PLATELET - Abnormal; Notable for the following components:      Result Value    RBC 3.29 (*)    Hemoglobin 11.5 (*)    HCT 34.0 (*)    MCV 103.3 (*)    MCH 35.0 (*)    RDW 15.7 (*)    All other components within normal limits  COMPREHENSIVE METABOLIC PANEL - Abnormal; Notable for the following components:   Chloride 112 (*)    BUN 21 (*)    Creatinine, Ser 1.25 (*)    Calcium 8.6 (*)    All other components within normal limits  URINALYSIS, ROUTINE W REFLEX MICROSCOPIC - Abnormal; Notable for the following components:   Color, Urine STRAW (*)    All other components within normal limits  RESP PANEL BY RT-PCR (FLU A&B, COVID) [id number]    EKG None  Radiology CT ABDOMEN PELVIS W CONTRAST  Result Date: 01/14/2022 CLINICAL DATA:  Concern for Crohn's exacerbation. EXAM: CT ABDOMEN AND PELVIS WITH CONTRAST TECHNIQUE: Multidetector CT imaging of the abdomen and pelvis was performed using the standard protocol following bolus administration of intravenous contrast. RADIATION DOSE REDUCTION: This exam was performed according to the departmental dose-optimization program which includes automated exposure control, adjustment of the mA and/or kV according to patient size and/or use of iterative reconstruction technique. CONTRAST:  135m OMNIPAQUE IOHEXOL 300 MG/ML  SOLN COMPARISON:  Multiple priors including most recent CT June 23, 2021. FINDINGS: Lower chest: Hypoventilatory change in the lung bases. Hepatobiliary: No suspicious hepatic lesion. Gallbladder is unremarkable. No biliary ductal dilation. Pancreas: No pancreatic ductal dilation or evidence of acute inflammation. Spleen: No splenomegaly or focal splenic lesion. Adrenals/Urinary Tract: Bilateral adrenal glands appear normal. No hydronephrosis. Kidneys demonstrate symmetric enhancement. Similar appearance of the bilateral renal cysts and renal lesions which are technically too small to accurately characterize but statistically likely to reflect cysts which in the absence of clinically indicated signs/symptoms require  no independent follow-up. Urinary bladder is unremarkable for degree of distension. Stomach/Bowel: No radiopaque enteric contrast material was administered. Stomach is unremarkable for degree of distension. No pathologic dilation of small or large bowel. The appendix and terminal ileum appear normal. Rectal wall thickening with perirectal inflammation and multiple punctate foci of gas contained within an area of inflammatory soft tissue posterior to the rectum extending superiorly to the inferior aspect of the coccyx and inferior to the gluteal crease measuring approximally 4.1 x 2.2 cm on image 90/2 with a soft tissue tract extending to the anal verge. Soft tissue tract extending from the anterior aspect of the anus into a tiny fluid collection in the anterior peritoneum  measuring approximally 1.3 cm on image 99/2. Vascular/Lymphatic: Normal caliber abdominal aorta. No pathologically enlarged abdominal or pelvic lymph nodes. Reproductive: Prostate is unremarkable. Other: No significant abdominopelvic free fluid. Musculoskeletal: Similar destructive erosion of the lower coccyx. IMPRESSION: Rectal wall thickening with perirectal inflammation and in the soft tissue tract extending from the anus to multiple punctate foci of gas contained within an area of inflammatory soft tissue posterior to the rectum measuring approximally 4.1 x 2.2 cm, suggestive of a fistula with developing perirectal/perianal abscess. Additional soft tissue tract extending from the anterior aspect of the anus into a tiny fluid collection in the anterior peritoneum measuring approximally 1.3 cm also suggestive of a fistula with perineal abscess. These findings could be more definitively characterized by perianal fistula protocol pelvic MRI with and without contrast. Consider surgical consult. Electronically Signed   By: Dahlia Bailiff M.D.   On: 01/14/2022 18:46    Procedures Procedures    Medications Ordered in ED Medications   piperacillin-tazobactam (ZOSYN) IVPB 3.375 g (3.375 g Intravenous New Bag/Given 01/14/22 2030)  ondansetron (ZOFRAN) injection 4 mg (4 mg Intravenous Given 01/14/22 1712)  morphine (PF) 4 MG/ML injection 4 mg (4 mg Intravenous Given 01/14/22 1711)  iohexol (OMNIPAQUE) 300 MG/ML solution 100 mL (100 mLs Intravenous Contrast Given 01/14/22 1816)  sodium chloride 0.9 % bolus 1,000 mL (0 mLs Intravenous Stopped 01/14/22 2013)  morphine (PF) 4 MG/ML injection 4 mg (4 mg Intravenous Given 01/14/22 2014)    ED Course/ Medical Decision Making/ A&P                           Medical Decision Making Amount and/or Complexity of Data Reviewed Labs: ordered.  Risk Prescription drug management. Decision regarding hospitalization.   This patient presents to the ED for concern of back pain, this involves an extensive number of treatment options, and is a complaint that carries with it a high risk of complications and morbidity.  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  The pertinent results include:  cbc with nl wbc, hgb 11.5 and hct 34; cmp with bun 21 and Cr 1.25; ua neg   Imaging Studies ordered:  I ordered imaging studies including ct abd/pelvis  I independently visualized and interpreted imaging which showed    IMPRESSION:  Rectal wall thickening with perirectal inflammation and in the soft  tissue tract extending from the anus to multiple punctate foci of  gas contained within an area of inflammatory soft tissue posterior  to the rectum measuring approximally 4.1 x 2.2 cm, suggestive of a  fistula with developing perirectal/perianal abscess. Additional soft  tissue tract extending from the anterior aspect of the anus into a  tiny fluid  collection in the anterior peritoneum measuring  approximally 1.3 cm also suggestive of a fistula with perineal  abscess. These findings could be more definitively characterized by  perianal fistula protocol pelvic MRI with and without contrast.  Consider surgical consult.   I agree with the radiologist interpretation   Cardiac Monitoring:  The patient was maintained on a cardiac monitor.  I personally viewed and interpreted the cardiac monitored which showed an underlying rhythm of: nsr   Medicines ordered and prescription drug management:  I ordered medication including  morphine and zofran  for pain and nausea;   Reevaluation of the patient after these medicines showed that the patient improved I have reviewed the patients home medicines and have made adjustments as needed   Test Considered:  ct   Critical Interventions:  Iv abx   Consultations Obtained:  I requested consultation with the gen surg (Dr. Brantley Stage),  and discussed lab and imaging findings as well as pertinent plan - he will admit.  Pt d/w Dr. Alcario Drought ([facility]) for consult Dr. Henrene Pastor ([facility]) sent a secure chat notification that pt is getting admitted.   Problem List / ED Course:  Perianal abscess with fistula:  IV abx.  Mgmt per surgery.    Reevaluation:  After the interventions noted above, I reevaluated the patient and found that they have :improved   Social Determinants of Health:  Lives at home with his wife   Dispostion:  After consideration of the diagnostic results and the patients response to treatment, I feel that the patent would benefit from admission.          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

## 2022-01-14 NOTE — H&P (Signed)
Shawn [name] is an 36 y.o. male.   Chief Complaint: Perianal pain for 5 days  HPI: Patient presents for evaluation of perianal pain.  History of Crohn's disease involving the rectum.'s been followed by Dr. Johney Maine and actually had a seton he said removed about 3 weeks ago.  Had some he started have more pain.  The pain began to worsen on Sunday and called the office and was told to the emergency room.  CT scan shows a perirectal abscess measuring 4 cm and changes consistent with Crohn's and possible recurrent fistula.  He has pain around the anus.  Denies any significant drainage though.  No fever or chills.  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   PO B12 initiated 12/31/20   Crohn's colitis ([place])    Dilated cardiomyopathy ([place]) 05/18/2013   By catheterization 2014    Fistula    Hypertension     Past Surgical History:  Procedure Laterality Date   BIOPSY  01/01/2021   Procedure: BIOPSY;  Surgeon: Ladene Artist, MD;  [facility] ENDOSCOPY;  Service: Endoscopy;;   COLONOSCOPY WITH PROPOFOL N/A 01/01/2021   Procedure: COLONOSCOPY WITH PROPOFOL;  Surgeon: Ladene Artist, MD;  Location: [facility] ENDOSCOPY;  Service: Endoscopy;  Laterality: N/A;   INCISION AND DRAINAGE ABSCESS N/A 07/07/2021   Procedure: MODIFIED HANLEY PROCEDURE, DRAINAGE WITH SETON PLACEMENT;  Surgeon: [name], MD;  Location: [facility] ORS;  Service: General;  Laterality: N/A;   LEFT AND RIGHT HEART CATHETERIZATION WITH CORONARY ANGIOGRAM N/A 11/21/2012   Procedure: LEFT AND RIGHT HEART CATHETERIZATION WITH CORONARY ANGIOGRAM;  Surgeon: Birdie Riddle, MD;  Location: [facility] CATH LAB;  Service: Cardiovascular;  Laterality: N/A;   MANDIBLE FRACTURE SURGERY  2006   PLACEMENT OF SETON N/A 07/07/2021   Procedure: PLACEMENT OF SETON;  Surgeon: [name], MD;  Location: [facility] ORS;  Service: General;  Laterality: N/A;    Family History  Problem Relation Age of  Onset   Diabetes Mother    Diabetes Sister    Colon cancer Neg Hx    Stomach cancer Neg Hx    Esophageal cancer Neg Hx    Pancreatic disease Neg Hx    Social History:  reports that he has been smoking cigarettes. He has been smoking an average of .25 packs per day. He has never used smokeless tobacco. He reports current alcohol use. He reports that he does not use drugs.  Allergies:  Allergies  Allergen Reactions   Shrimp [Shellfish Allergy] Shortness Of Breath   Nsaids Other (See Comments)    CROHN'S DISEASE = NO NSAIDS    (Not in a hospital admission)   Results for orders placed or performed during the hospital encounter of 01/14/22 (from the past 48 hour(s))  CBC with Differential     Status: Abnormal   Collection Time: 01/14/22  4:15 PM  Result Value Ref Range   WBC 9.8 4.0 - 10.5 K/uL   RBC 3.29 (L) 4.22 - 5.81 MIL/uL   Hemoglobin 11.5 (L) 13.0 - 17.0 g/dL   HCT 34.0 (L) 39.0 - 52.0 %   MCV 103.3 (H) 80.0 - 100.0 fL   MCH 35.0 (H) 26.0 - 34.0 pg   MCHC 33.8 30.0 - 36.0 g/dL   RDW 15.7 (H) 11.5 - 15.5 %   Platelets 310 150 - 400 K/uL   nRBC 0.0 0.0 - 0.2 %   Neutrophils Relative % 58 %  Neutro Abs 5.8 1.7 - 7.7 K/uL   Lymphocytes Relative 30 %   Lymphs Abs 2.9 0.7 - 4.0 K/uL   Monocytes Relative 6 %   Monocytes Absolute 0.6 0.1 - 1.0 K/uL   Eosinophils Relative 5 %   Eosinophils Absolute 0.5 0.0 - 0.5 K/uL   Basophils Relative 1 %   Basophils Absolute 0.1 0.0 - 0.1 K/uL   Immature Granulocytes 0 %   Abs Immature Granulocytes 0.03 0.00 - 0.07 K/uL    Comment: Performed at [facility], [place] [address] [place], [place] [address]  Comprehensive metabolic panel     Status: Abnormal   Collection Time: 01/14/22  4:15 PM  Result Value Ref Range   Sodium 139 135 - 145 mmol/L   Potassium 5.0 3.5 - 5.1 mmol/L   Chloride 112 (H) 98 - 111 mmol/L   CO2 22 22 - 32 mmol/L   Glucose, Bld 93 70 - 99 mg/dL    Comment: Glucose reference range applies only  to samples taken after fasting for at least 8 hours.   BUN 21 (H) 6 - 20 mg/dL   Creatinine, Ser 1.25 (H) 0.61 - 1.24 mg/dL   Calcium 8.6 (L) 8.9 - 10.3 mg/dL   Total Protein 8.0 6.5 - 8.1 g/dL   Albumin 3.8 3.5 - 5.0 g/dL   AST 17 15 - 41 U/L   ALT 22 0 - 44 U/L   Alkaline Phosphatase 112 38 - 126 U/L   Total Bilirubin 0.5 0.3 - 1.2 mg/dL   GFR, Estimated >60 >60 mL/min    Comment: (NOTE) Calculated using the CKD-EPI Creatinine Equation (2021)    Anion gap 5 5 - 15    Comment: Performed at [facility], [place] [address] [place], [place] [address]  Resp Panel by RT-PCR (Flu A&B, Covid) Anterior Nasal Swab     Status: None   Collection Time: 01/14/22  4:15 PM   Specimen: Anterior Nasal Swab  Result Value Ref Range   SARS Coronavirus 2 by RT PCR NEGATIVE NEGATIVE    Comment: (NOTE) SARS-CoV-2 target nucleic acids are NOT DETECTED.  The SARS-CoV-2 RNA is generally detectable in upper respiratory specimens during the acute phase of infection. The lowest concentration of SARS-CoV-2 viral copies this assay can detect is 138 copies/mL. A negative result does not preclude SARS-Cov-2 infection and should not be used as the sole basis for treatment or other patient management decisions. A negative result may occur with  improper specimen collection/handling, submission of specimen other than nasopharyngeal swab, presence of viral mutation(s) within the areas targeted by this assay, and inadequate number of viral copies(<138 copies/mL). A negative result must be combined with clinical observations, patient history, and epidemiological information. The expected result is Negative.  Fact Sheet for Patients:  EntrepreneurPulse.com.au  Fact Sheet for Healthcare Providers:  IncredibleEmployment.be  This test is no t yet approved or cleared by the [place] FDA and  has been authorized for detection and/or diagnosis of SARS-CoV-2  by FDA under an Emergency Use Authorization (EUA). This EUA will remain  in effect (meaning this test can be used) for the duration of the COVID-19 declaration under Section 564(b)(1) of the Act, 21 U.S.C.section 360bbb-3(b)(1), unless the authorization is terminated  or revoked sooner.       Influenza A by PCR NEGATIVE NEGATIVE   Influenza B by PCR NEGATIVE NEGATIVE    Comment: (NOTE) The Xpert Xpress SARS-CoV-2/FLU/RSV plus assay is intended as an aid  in the diagnosis of influenza from Nasopharyngeal swab specimens and should not be used as a sole basis for treatment. Nasal washings and aspirates are unacceptable for Xpert Xpress SARS-CoV-2/FLU/RSV testing.  Fact Sheet for Patients: EntrepreneurPulse.com.au  Fact Sheet for Healthcare Providers: IncredibleEmployment.be  This test is not yet approved or cleared by the [place] FDA and has been authorized for detection and/or diagnosis of SARS-CoV-2 by FDA under an Emergency Use Authorization (EUA). This EUA will remain in effect (meaning this test can be used) for the duration of the COVID-19 declaration under Section 564(b)(1) of the Act, 21 U.S.C. section 360bbb-3(b)(1), unless the authorization is terminated or revoked.  Performed at  [facility], [place] [address] [place], [place] [address]   Urinalysis, Routine w reflex microscopic Urine, Clean Catch     Status: Abnormal   Collection Time: 01/14/22  7:20 PM  Result Value Ref Range   Color, Urine STRAW (A) YELLOW   APPearance CLEAR CLEAR   Specific Gravity, Urine 1.019 1.005 - 1.030   pH 6.0 5.0 - 8.0   Glucose, UA NEGATIVE NEGATIVE mg/dL   Hgb urine dipstick NEGATIVE NEGATIVE   Bilirubin Urine NEGATIVE NEGATIVE   Ketones, ur NEGATIVE NEGATIVE mg/dL   Protein, ur NEGATIVE NEGATIVE mg/dL   Nitrite NEGATIVE NEGATIVE   Leukocytes,Ua NEGATIVE NEGATIVE    Comment: Performed at [place] [address], [place], [place] [address]   CT ABDOMEN PELVIS W CONTRAST  Result Date: 01/14/2022 CLINICAL DATA:  Concern for Crohn's exacerbation. EXAM: CT ABDOMEN AND PELVIS WITH CONTRAST TECHNIQUE: Multidetector CT imaging of the abdomen and pelvis was performed using the standard protocol following bolus administration of intravenous contrast. RADIATION DOSE REDUCTION: This exam was performed according to the departmental dose-optimization program which includes automated exposure control, adjustment of the mA and/or kV according to patient size and/or use of iterative reconstruction technique. CONTRAST:  18m OMNIPAQUE IOHEXOL 300 MG/ML  SOLN COMPARISON:  Multiple priors including most recent CT June 23, 2021. FINDINGS: Lower chest: Hypoventilatory change in the lung bases. Hepatobiliary: No suspicious hepatic lesion. Gallbladder is unremarkable. No biliary ductal dilation. Pancreas: No pancreatic ductal dilation or evidence of acute inflammation. Spleen: No splenomegaly or focal splenic lesion. Adrenals/Urinary Tract: Bilateral adrenal glands appear normal. No hydronephrosis. Kidneys demonstrate symmetric enhancement. Similar appearance of the bilateral renal cysts and renal lesions which are technically too small to accurately characterize but statistically likely to reflect cysts which in the absence of clinically indicated signs/symptoms require no independent follow-up. Urinary bladder is unremarkable for degree of distension. Stomach/Bowel: No radiopaque enteric contrast material was administered. Stomach is unremarkable for degree of distension. No pathologic dilation of small or large bowel. The appendix and terminal ileum appear normal. Rectal wall thickening with perirectal inflammation and multiple punctate foci of gas contained within an area of inflammatory soft tissue posterior to the rectum extending superiorly to the inferior aspect of the coccyx and inferior to the gluteal crease measuring  approximally 4.1 x 2.2 cm on image 90/2 with a soft tissue tract extending to the anal verge. Soft tissue tract extending from the anterior aspect of the anus into a tiny fluid collection in the anterior peritoneum measuring approximally 1.3 cm on image 99/2. Vascular/Lymphatic: Normal caliber abdominal aorta. No pathologically enlarged abdominal or pelvic lymph nodes. Reproductive: Prostate is unremarkable. Other: No significant abdominopelvic free fluid. Musculoskeletal: Similar destructive erosion of the lower coccyx. IMPRESSION: Rectal wall thickening with perirectal inflammation and in the soft tissue tract  extending from the anus to multiple punctate foci of gas contained within an area of inflammatory soft tissue posterior to the rectum measuring approximally 4.1 x 2.2 cm, suggestive of a fistula with developing perirectal/perianal abscess. Additional soft tissue tract extending from the anterior aspect of the anus into a tiny fluid collection in the anterior peritoneum measuring approximally 1.3 cm also suggestive of a fistula with perineal abscess. These findings could be more definitively characterized by perianal fistula protocol pelvic MRI with and without contrast. Consider surgical consult. Electronically Signed   By: Dahlia Bailiff M.D.   On: 01/14/2022 18:46    Review of Systems  Constitutional: Negative.   HENT: Negative.    Eyes: Negative.   Respiratory: Negative.    Cardiovascular: Negative.   Gastrointestinal:  Positive for abdominal pain and rectal pain.  Musculoskeletal: Negative.   Skin: Negative.   Hematological: Negative.   Psychiatric/Behavioral: Negative.     Blood pressure 118/85, pulse (!) 56, temperature 98.1 F (36.7 C), temperature source Oral, resp. rate 11, height 6' 1"  (1.854 m), weight 89.4 kg, SpO2 100 %. Physical Exam Constitutional:      Appearance: Normal appearance.  HENT:     Head: Normocephalic.     Mouth/Throat:     Mouth: Mucous membranes are moist.   Eyes:     Pupils: Pupils are equal, round, and reactive to light.  Cardiovascular:     Rate and Rhythm: Normal rate.  Genitourinary:    Comments: Boggy swollen tender anal canal.  No obvious drainage.  No obvious fistula Musculoskeletal:     Cervical back: Normal range of motion.  Skin:    General: Skin is warm.  Neurological:     General: No focal deficit present.     Mental Status: He is alert.     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 Johney Maine.  Admit for IV antibiotics.  He may require exam under anesthesia.  Dr. Shirlee Limerick to assess in a.m.  We will allow him to eat until midnight then n.p.o. after midnight.  Total time 30 minutes  Turner Daniels, MD 01/14/2022, 9:27 PM

## 2022-01-14 NOTE — Assessment & Plan Note (Addendum)
See on cath 2014. However, normal EF with moderate LVH on most recent echo (April 2022). 1. Ordering EKG to get this updated.

## 2022-01-14 NOTE — ED Triage Notes (Signed)
Patient reports that he was sent to the ED for a CT scan of the rectum/coccyx. Patient reports a history of osteomyelitis, fistulas, and Crohn's  Patient c/o pain of the coccyx x 4 days.

## 2022-01-14 NOTE — Assessment & Plan Note (Addendum)
History of... Demonstrated with persistent/progressive on MRI in Aug 2022, secondary to Crohn's dz with fistula. 1. Ultimately had surgical bone resection as part of modified Hanley procedure in Nov 2022 2. Completed 4 weeks ABx of cefdinir + flagyl after that with apparent resolution per ID office note in Dec 2022.

## 2022-01-14 NOTE — Assessment & Plan Note (Addendum)
Started on Remicaid Late ([place]) 2022. Now with apparent recurrent fistula(s) and abscess(es) on CT today. 1. Holding remicaid in setting of acute infection, though he just had this May 8th (and wouldn't be due for next dose until July anyhow) 1. Concerning for apparent remicaid failure? 2. Would defer decision of further treatment to GI on follow up. 2. Defer decisions on steroids to general surgery / GI. 3. Will send message to [facility] for AM consult (vs outpt follow up): ? Remicaid failure?

## 2022-01-14 NOTE — Assessment & Plan Note (Signed)
Creat today of 1.25 is actually the best hes had in quite some time. Was running ~1.5 at baseline for most of late last year.

## 2022-01-14 NOTE — Consult Note (Signed)
Initial Consultation Note   Patient: Shawn [name] QAS:[id number] DOB: Jan 17, 1986 PCP: Charlott Rakes, MD DOA: 01/14/2022 DOS: the patient was seen and examined on 01/14/2022 Primary service: Edison Pace Md, MD  Referring physician: Dr. Gilford Raid Reason for consult: Crohn's disease with perirectal fistula and abscess  Assessment/Plan: Assessment and Plan: * Abscess, perirectal, x2  General surgery is primary Dilaudid PRN pain NPO after MN Empiric zosyn  Anorectal fistula Findings on CT concerning for apparent recurrent fistula(s) and abscess(es). As above.  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 Demonstrated with persistent/progressive on MRI in Aug 2022, secondary to Crohn's dz with fistula. Ultimately had surgical bone resection as part of modified Hanley procedure in Nov 2022 Completed 4 weeks ABx of cefdinir + flagyl after that with apparent resolution per ID office note in Dec 2022.  Crohn's proctitis, with fistula ([place]) Started on Remicaid Late (Nov-Dec) 2022. Now with apparent recurrent fistula(s) and abscess(es) on CT today. Holding remicaid in setting of acute infection, though he just had this May 8th (and wouldn't be due for next dose until July anyhow) Concerning for apparent remicaid failure? Would defer decision of further treatment to GI on follow up. Defer decisions on steroids to general surgery / GI. Will send message to [facility] for AM consult (vs outpt follow up): ? Remicaid failure?  CKD (chronic kidney disease) stage 3, GFR 30-59 ml/min (HCC) Creat today of 1.25 is actually the best hes had in quite some time. Was running ~1.5 at baseline for most of late last year.  Chronic disease anemia H/o chronic blood loss anemia due to crohn's dz with HGB running 7-8 range late last year. Appears significantly improved today with HGB 11.5. Though he now has macrocytosis (? Recurrent B12 deficiency). Check vitamin B12 level.  Alcohol abuse Pt states only  "occasional" EtOH use.  Last use was ~2 weeks ago. Denies heavy use or history of withdrawals. Just observe for development of symptoms at this point.  Dilated cardiomyopathy ([place]) See on cath 2014. However, normal EF with moderate LVH on most recent echo (April 2022). Ordering EKG to get this updated.  Hypertension Chart diagnosis.  Doesn't appear to be on any chronic HTN meds based on med-rec today. And BP in ED is 118/85 Monitor for now       [facility] will continue to follow the patient.  HPI: Shawn [name] is a 36 y.o. male with past medical history of Crohn's disease complicated by perirectal fistula x2 with perirectal abscesses, osteomyelitis of coccyx.  Pt had modified Hanley procedure in Nov 2022 with resection of osteomyelitis.  Completed 4 weeks ABx in Dec 2022 with apparent resolution of osteomyelitis and abscess.  Had been doing better apparently after remicaid was started around that time.  He continues to take remicaid every 2 months as scheduled, most recent dose was 12/28/2020.  Had last seton removed x3 weeks ago.  Since that time having a bit more perianal pain, though this really became much worse ~5 days ago.  Pt called [facility] office today and was directed to go to ER.  No fevers, no chills.  Review of Systems: As mentioned in the history of present illness. All other systems reviewed and are negative. 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   PO B12 initiated 12/31/20   Crohn's colitis ([place])    Dilated cardiomyopathy ([place]) 05/18/2013   By catheterization 2014    Fistula    Hypertension  Past Surgical History:  Procedure Laterality Date   BIOPSY  01/01/2021   Procedure: BIOPSY;  Surgeon: Ladene Artist, MD;  Location: [place];  Service: Endoscopy;;   COLONOSCOPY WITH PROPOFOL N/A 01/01/2021   Procedure: COLONOSCOPY WITH PROPOFOL;  Surgeon: Ladene Artist, MD;  Location: [facility]  ENDOSCOPY;  Service: Endoscopy;  Laterality: N/A;   INCISION AND DRAINAGE ABSCESS N/A 07/07/2021   Procedure: MODIFIED HANLEY PROCEDURE, DRAINAGE WITH SETON PLACEMENT;  Surgeon: [name], MD;  Location: [facility];  Service: General;  Laterality: N/A;   LEFT AND RIGHT HEART CATHETERIZATION WITH CORONARY ANGIOGRAM N/A 11/21/2012   Procedure: LEFT AND RIGHT HEART CATHETERIZATION WITH CORONARY ANGIOGRAM;  Surgeon: Birdie Riddle, MD;  Location: [place] CATH LAB;  Service: Cardiovascular;  Laterality: N/A;   MANDIBLE FRACTURE SURGERY  2006   PLACEMENT OF SETON N/A 07/07/2021   Procedure: PLACEMENT OF SETON;  Surgeon: [name], MD;  Location: [facility];  Service: General;  Laterality: N/A;   Social History:  reports that he has been smoking cigarettes. He has been smoking an average of .25 packs per day. He has never used smokeless tobacco. He reports current alcohol use. He reports that he does not use drugs.  Allergies  Allergen Reactions   Shrimp [Shellfish Allergy] Shortness Of Breath   Nsaids Other (See Comments)    CROHN'S DISEASE = NO NSAIDS    Family History  Problem Relation Age of Onset   Diabetes Mother    Diabetes Sister    Colon cancer Neg Hx    Stomach cancer Neg Hx    Esophageal cancer Neg Hx    Pancreatic disease Neg Hx     Prior to Admission medications   Medication Sig Start Date End Date Taking? Authorizing Provider  oxyCODONE-acetaminophen (PERCOCET) 7.5-325 MG tablet Take 1 tablet by mouth in the morning, at noon, in the evening, and at bedtime.   Yes [provider]  acetaminophen (TYLENOL) 500 MG tablet Take 2 tablets (1,000 mg total) by mouth every 6 (six) hours. Patient not taking: Reported on 01/14/2022 04/22/21   Nita Sells, MD  DULoxetine (CYMBALTA) 60 MG capsule Take 1 capsule (60 mg total) by mouth daily. For tail bone pain Patient not taking: Reported on 08/05/2021 05/27/21   Charlott Rakes, MD  multivitamin-iron-minerals-folic acid (CENTRUM)  chewable tablet Chew 1 tablet by mouth daily. Patient not taking: Reported on 08/05/2021 07/10/21   [name], MD  pantoprazole (PROTONIX) 40 MG tablet Take 1 tablet (40 mg total) by mouth daily. Patient not taking: Reported on 01/14/2022 08/05/21   Ladene Artist, MD  polyethylene glycol powder [facility]) 17 GM/SCOOP powder Take 17 g by mouth daily. Patient not taking: Reported on 01/14/2022 05/27/21   Charlott Rakes, MD  vitamin B-12 1000 MCG tablet Take 1 tablet (1,000 mcg total) by mouth daily. Patient not taking: Reported on 01/14/2022 07/10/21   [name], MD    Physical Exam: Vitals:   01/14/22 1916 01/14/22 2000 01/14/22 2100 01/14/22 2200  BP: 139/74 122/84 118/85 138/87  Pulse: 60 60 (!) 56 67  Resp: 16 10 11 15   Temp: 98.1 F (36.7 C)     TempSrc: Oral     SpO2: 100% 100% 100% 100%  Weight:      Height:       Constitutional: NAD, calm, comfortable Eyes: PERRL, lids and conjunctivae normal ENMT: Mucous membranes are moist. Posterior pharynx clear of any exudate or lesions.Normal dentition.  Neck: normal, supple, no masses, no thyromegaly  Respiratory: clear to auscultation bilaterally, no wheezing, no crackles. Normal respiratory effort. No accessory muscle use.  Cardiovascular: Regular rate and rhythm, no murmurs / rubs / gallops. No extremity edema. 2+ pedal pulses. No carotid bruits.  Abdomen: no tenderness, no masses palpated. No hepatosplenomegaly. Bowel sounds positive.  Musculoskeletal: no clubbing / cyanosis. No joint deformity upper and lower extremities. Good ROM, no contractures. Normal muscle tone.  Skin: no rashes, lesions, ulcers. No induration Neurologic: CN 2-12 grossly intact. Sensation intact, DTR normal. Strength 5/5 in all 4.  Psychiatric: Normal judgment and insight. Alert and oriented x 3. Normal mood.   Data Reviewed:       CT AP: IMPRESSION: Rectal wall thickening with perirectal inflammation and in the soft tissue tract  extending from the anus to multiple punctate foci of gas contained within an area of inflammatory soft tissue posterior to the rectum measuring approximally 4.1 x 2.2 cm, suggestive of a fistula with developing perirectal/perianal abscess. Additional soft tissue tract extending from the anterior aspect of the anus into a tiny fluid collection in the anterior peritoneum measuring approximally 1.3 cm also suggestive of a fistula with perineal abscess. These findings could be more definitively characterized by perianal fistula protocol pelvic MRI with and without contrast. Consider surgical consult.    Family Communication: No family in room Primary team communication: Dr. Brantley Stage Thank you very much for involving [place] in the care of your patient.  Author: Etta Quill., DO 01/14/2022 10:08 PM  For on call review [email or web address].

## 2022-01-14 NOTE — Assessment & Plan Note (Signed)
Findings on CT concerning for apparent recurrent fistula(s) and abscess(es). As above.

## 2022-01-14 NOTE — Assessment & Plan Note (Signed)
Chart diagnosis.  Doesn't appear to be on any chronic HTN meds based on med-rec today. And BP in ED is 118/85 1. Monitor for now

## 2022-01-14 NOTE — Assessment & Plan Note (Addendum)
1. General surgery is primary 1. Dilaudid PRN pain 2. NPO after MN 2. Empiric zosyn

## 2022-01-14 NOTE — Assessment & Plan Note (Signed)
H/o chronic blood loss anemia due to crohn's dz with HGB running 7-8 range late last year. Appears significantly improved today with HGB 11.5. Though he now has macrocytosis (? Recurrent B12 deficiency). 1. Check vitamin B12 level.

## 2022-01-14 NOTE — Assessment & Plan Note (Addendum)
Pt states only "occasional" EtOH use.  Last use was ~2 weeks ago. Denies heavy use or history of withdrawals. 1. Just observe for development of symptoms at this point.

## 2022-01-14 NOTE — ED Provider Triage Note (Signed)
Emergency Medicine Provider Triage Evaluation Note  Shawn [name] , a 36 y.o. male  was evaluated in triage.  Pt  sent in by [facility] for CT pelvis, hx of fistula/ surgery in November .; took last band out on Mya 9. Having recurrence of pain. Hx of chron's.   Review of Systems  Positive: Recta'/ sacral pain  Negative: fever  Physical Exam  BP (!) 143/87   Pulse 71   Temp 98 F (36.7 C)   Resp 18   Ht 6' 1"  (1.854 m)   Wt 89.4 kg   SpO2 98%   BMI 25.99 kg/m  Gen:   Awake, no distress   Resp:  Normal effort  MSK:   Moves extremities without difficulty  Other:  Sitting , leaning to the right   Medical Decision Making  Medically screening exam initiated at 4:11 PM.  Appropriate orders placed.  Shawn [name] was informed that the remainder of the evaluation will be completed by another provider, this initial triage assessment does not replace that evaluation, and the importance of remaining in the ED until their evaluation is complete.  Work up / imaging ordered   Shawn Mail, PA-C 01/14/22 1616

## 2022-01-15 ENCOUNTER — Inpatient Hospital Stay (HOSPITAL_COMMUNITY): Payer: Commercial Managed Care - HMO | Admitting: Anesthesiology

## 2022-01-15 ENCOUNTER — Other Ambulatory Visit: Payer: Self-pay

## 2022-01-15 ENCOUNTER — Other Ambulatory Visit: Payer: Self-pay | Admitting: Pharmacy Technician

## 2022-01-15 ENCOUNTER — Encounter (HOSPITAL_COMMUNITY): Admission: EM | Disposition: A | Discharge status: Home / Self Care | Payer: Self-pay | Source: Home / Self Care

## 2022-01-15 ENCOUNTER — Encounter (HOSPITAL_COMMUNITY): Payer: Self-pay

## 2022-01-15 DIAGNOSIS — K5010 Crohn's disease of large intestine without complications: Secondary | ICD-10-CM | POA: Diagnosis not present

## 2022-01-15 DIAGNOSIS — F1010 Alcohol abuse, uncomplicated: Secondary | ICD-10-CM | POA: Diagnosis present

## 2022-01-15 DIAGNOSIS — K605 Anorectal fistula: Secondary | ICD-10-CM | POA: Diagnosis not present

## 2022-01-15 DIAGNOSIS — I10 Essential (primary) hypertension: Secondary | ICD-10-CM | POA: Diagnosis not present

## 2022-01-15 DIAGNOSIS — D638 Anemia in other chronic diseases classified elsewhere: Secondary | ICD-10-CM | POA: Diagnosis not present

## 2022-01-15 DIAGNOSIS — K6289 Other specified diseases of anus and rectum: Secondary | ICD-10-CM | POA: Diagnosis present

## 2022-01-15 DIAGNOSIS — K603 Anal fistula: Secondary | ICD-10-CM | POA: Diagnosis not present

## 2022-01-15 DIAGNOSIS — D631 Anemia in chronic kidney disease: Secondary | ICD-10-CM | POA: Diagnosis present

## 2022-01-15 DIAGNOSIS — F17210 Nicotine dependence, cigarettes, uncomplicated: Secondary | ICD-10-CM

## 2022-01-15 DIAGNOSIS — Z20822 Contact with and (suspected) exposure to covid-19: Secondary | ICD-10-CM | POA: Diagnosis present

## 2022-01-15 DIAGNOSIS — Z886 Allergy status to analgesic agent status: Secondary | ICD-10-CM | POA: Diagnosis not present

## 2022-01-15 DIAGNOSIS — K611 Rectal abscess: Secondary | ICD-10-CM | POA: Diagnosis present

## 2022-01-15 DIAGNOSIS — L02215 Cutaneous abscess of perineum: Secondary | ICD-10-CM | POA: Diagnosis not present

## 2022-01-15 DIAGNOSIS — Z91013 Allergy to seafood: Secondary | ICD-10-CM | POA: Diagnosis not present

## 2022-01-15 DIAGNOSIS — I420 Dilated cardiomyopathy: Secondary | ICD-10-CM | POA: Diagnosis present

## 2022-01-15 DIAGNOSIS — K50113 Crohn's disease of large intestine with fistula: Secondary | ICD-10-CM | POA: Diagnosis not present

## 2022-01-15 DIAGNOSIS — I129 Hypertensive chronic kidney disease with stage 1 through stage 4 chronic kidney disease, or unspecified chronic kidney disease: Secondary | ICD-10-CM | POA: Diagnosis present

## 2022-01-15 DIAGNOSIS — N183 Chronic kidney disease, stage 3 unspecified: Secondary | ICD-10-CM | POA: Diagnosis present

## 2022-01-15 DIAGNOSIS — K50913 Crohn's disease, unspecified, with fistula: Secondary | ICD-10-CM | POA: Diagnosis present

## 2022-01-15 HISTORY — PX: PLACEMENT OF SETON: SHX6029

## 2022-01-15 HISTORY — PX: RECTAL EXAM UNDER ANESTHESIA: SHX6399

## 2022-01-15 LAB — SURGICAL PCR SCREEN
MRSA, PCR: NEGATIVE
Staphylococcus aureus: POSITIVE — AB

## 2022-01-15 LAB — HIV ANTIBODY (ROUTINE TESTING W REFLEX): HIV Screen 4th Generation wRfx: NONREACTIVE

## 2022-01-15 LAB — VITAMIN B12: Vitamin B-12: <50 pg/mL — ABNORMAL LOW (ref 180–914)

## 2022-01-15 SURGERY — EXAM UNDER ANESTHESIA, RECTUM
Anesthesia: General

## 2022-01-15 MED ORDER — 0.9 % SODIUM CHLORIDE (POUR BTL) OPTIME
TOPICAL | Status: DC | PRN
  Administered 2022-01-15: 1000 mL

## 2022-01-15 MED ORDER — WITCH HAZEL-GLYCERIN EX PADS: 1.0000 "application " | MEDICATED_PAD | CUTANEOUS | Status: DC | PRN

## 2022-01-15 MED ORDER — BUPIVACAINE-EPINEPHRINE 0.5% -1:200000 IJ SOLN
INTRAMUSCULAR | Status: DC | PRN
Start: 2022-01-15 — End: 2022-01-15
  Administered 2022-01-15: 30 mL

## 2022-01-15 MED ORDER — DEXAMETHASONE SODIUM PHOSPHATE 10 MG/ML IJ SOLN
INTRAMUSCULAR | Status: DC | PRN
  Administered 2022-01-15: 10 mg via INTRAVENOUS

## 2022-01-15 MED ORDER — ACETAMINOPHEN 500 MG PO TABS
1000.0000 mg | ORAL_TABLET | Freq: Four times a day (QID) | ORAL | Status: DC
  Administered 2022-01-15 – 2022-01-16 (×3): 1000 mg via ORAL
  Filled 2022-01-15 (×2): qty 2

## 2022-01-15 MED ORDER — FENTANYL CITRATE (PF) 250 MCG/5ML IJ SOLN
INTRAMUSCULAR | Status: AC
Start: 2022-01-15 — End: ?
  Filled 2022-01-15: qty 5

## 2022-01-15 MED ORDER — PANTOPRAZOLE SODIUM 40 MG PO TBEC
40.0000 mg | DELAYED_RELEASE_TABLET | Freq: Every day | ORAL | Status: DC
  Administered 2022-01-16: 40 mg via ORAL
  Filled 2022-01-15: qty 1

## 2022-01-15 MED ORDER — CALCIUM POLYCARBOPHIL 625 MG PO TABS
625.0000 mg | ORAL_TABLET | Freq: Two times a day (BID) | ORAL | Status: DC
Start: 2022-01-15 — End: 2022-01-16
  Administered 2022-01-15 – 2022-01-16 (×2): 625 mg via ORAL
  Filled 2022-01-15 (×2): qty 1

## 2022-01-15 MED ORDER — FENTANYL CITRATE (PF) 100 MCG/2ML IJ SOLN
INTRAMUSCULAR | Status: DC | PRN
  Administered 2022-01-15 (×3): 50 ug via INTRAVENOUS

## 2022-01-15 MED ORDER — HYDROMORPHONE HCL 1 MG/ML IJ SOLN
0.5000 mg | INTRAMUSCULAR | Status: DC | PRN
  Administered 2022-01-15: 1 mg via INTRAVENOUS
  Filled 2022-01-15: qty 1

## 2022-01-15 MED ORDER — MEPERIDINE HCL 50 MG/ML IJ SOLN: 6.2500 mg | INTRAMUSCULAR | Status: DC | PRN

## 2022-01-15 MED ORDER — SIMETHICONE 40 MG/0.6ML PO SUSP: 80.0000 mg | Freq: Four times a day (QID) | ORAL | Status: DC | PRN

## 2022-01-15 MED ORDER — ZINC OXIDE 40 % EX OINT
TOPICAL_OINTMENT | Freq: Two times a day (BID) | CUTANEOUS | Status: DC
  Filled 2022-01-15 (×2): qty 57

## 2022-01-15 MED ORDER — OXYCODONE HCL 5 MG/5ML PO SOLN
5.0000 mg | ORAL | Status: DC | PRN
  Administered 2022-01-16 (×3): 5 mg via ORAL
  Filled 2022-01-15 (×3): qty 5

## 2022-01-15 MED ORDER — FENTANYL CITRATE (PF) 100 MCG/2ML IJ SOLN
INTRAMUSCULAR | Status: AC
  Filled 2022-01-15: qty 2

## 2022-01-15 MED ORDER — HYDROMORPHONE HCL 1 MG/ML IJ SOLN
1.0000 mg | INTRAMUSCULAR | Status: DC | PRN
  Administered 2022-01-15: 1 mg via INTRAVENOUS
  Filled 2022-01-15: qty 1

## 2022-01-15 MED ORDER — METHYLENE BLUE 1 % INJ SOLN
INTRAVENOUS | Status: AC
  Filled 2022-01-15: qty 10

## 2022-01-15 MED ORDER — PIPERACILLIN-TAZOBACTAM 3.375 G IVPB
3.3750 g | Freq: Three times a day (TID) | INTRAVENOUS | Status: DC
  Administered 2022-01-15 – 2022-01-16 (×4): 3.375 g via INTRAVENOUS
  Filled 2022-01-15 (×4): qty 50

## 2022-01-15 MED ORDER — LIDOCAINE 2% (20 MG/ML) 5 ML SYRINGE
INTRAMUSCULAR | Status: DC | PRN
  Administered 2022-01-15: 80 mg via INTRAVENOUS

## 2022-01-15 MED ORDER — OXYCODONE HCL 5 MG/5ML PO SOLN: 5.0000 mg | Freq: Once | ORAL | Status: DC | PRN

## 2022-01-15 MED ORDER — ACETAMINOPHEN 325 MG PO TABS: 650.0000 mg | ORAL_TABLET | Freq: Four times a day (QID) | ORAL | Status: DC | PRN

## 2022-01-15 MED ORDER — AMISULPRIDE (ANTIEMETIC) 5 MG/2ML IV SOLN: 10.0000 mg | Freq: Once | INTRAVENOUS | Status: DC | PRN

## 2022-01-15 MED ORDER — OXYCODONE HCL 5 MG PO TABS: 5.0000 mg | ORAL_TABLET | Freq: Once | ORAL | Status: DC | PRN

## 2022-01-15 MED ORDER — ACETAMINOPHEN 500 MG PO TABS: 1000.0000 mg | ORAL_TABLET | ORAL | Status: AC

## 2022-01-15 MED ORDER — SODIUM CHLORIDE 0.9% FLUSH: 3.0000 mL | Freq: Two times a day (BID) | INTRAVENOUS | Status: DC

## 2022-01-15 MED ORDER — SODIUM CHLORIDE 0.9 % IV SOLN: 8.0000 mg | Freq: Four times a day (QID) | INTRAVENOUS | Status: DC | PRN

## 2022-01-15 MED ORDER — ACETAMINOPHEN 650 MG RE SUPP: 650.0000 mg | Freq: Four times a day (QID) | RECTAL | Status: DC | PRN

## 2022-01-15 MED ORDER — PHENOL 1.4 % MT LIQD
2.0000 | OROMUCOSAL | Status: DC | PRN
Start: 2022-01-15 — End: 2022-01-16

## 2022-01-15 MED ORDER — ONDANSETRON HCL 4 MG/2ML IJ SOLN
INTRAMUSCULAR | Status: DC | PRN
Start: 2022-01-15 — End: 2022-01-15
  Administered 2022-01-15: 4 mg via INTRAVENOUS

## 2022-01-15 MED ORDER — LIP MEDEX EX OINT
1.0000 "application " | TOPICAL_OINTMENT | Freq: Two times a day (BID) | CUTANEOUS | Status: DC
  Administered 2022-01-15 – 2022-01-16 (×2): 1 via TOPICAL
  Filled 2022-01-15: qty 7

## 2022-01-15 MED ORDER — CENTRUM PO CHEW: 1.0000 | CHEWABLE_TABLET | Freq: Every day | ORAL | Status: DC

## 2022-01-15 MED ORDER — CHLORHEXIDINE GLUCONATE CLOTH 2 % EX PADS: 6.0000 | MEDICATED_PAD | Freq: Once | CUTANEOUS | Status: DC

## 2022-01-15 MED ORDER — HYDROMORPHONE HCL 1 MG/ML IJ SOLN
INTRAMUSCULAR | Status: AC
  Filled 2022-01-15: qty 1

## 2022-01-15 MED ORDER — ROCURONIUM BROMIDE 10 MG/ML (PF) SYRINGE
PREFILLED_SYRINGE | INTRAVENOUS | Status: DC | PRN
  Administered 2022-01-15: 40 mg via INTRAVENOUS

## 2022-01-15 MED ORDER — MAGIC MOUTHWASH
15.0000 mL | Freq: Four times a day (QID) | ORAL | Status: DC | PRN
Start: 2022-01-15 — End: 2022-01-16

## 2022-01-15 MED ORDER — METOPROLOL TARTRATE 5 MG/5ML IV SOLN: 5.0000 mg | Freq: Four times a day (QID) | INTRAVENOUS | Status: DC | PRN

## 2022-01-15 MED ORDER — SUCCINYLCHOLINE CHLORIDE 200 MG/10ML IV SOSY
PREFILLED_SYRINGE | INTRAVENOUS | Status: DC | PRN
  Administered 2022-01-15: 100 mg via INTRAVENOUS

## 2022-01-15 MED ORDER — SODIUM CHLORIDE 0.9 % IV SOLN
250.0000 mL | INTRAVENOUS | Status: DC | PRN
Start: 2022-01-15 — End: 2022-01-16
  Administered 2022-01-15: 250 mL via INTRAVENOUS

## 2022-01-15 MED ORDER — LACTATED RINGERS IV SOLN: INTRAVENOUS | Status: DC

## 2022-01-15 MED ORDER — PHENYLEPHRINE HCL (PRESSORS) 10 MG/ML IV SOLN
INTRAVENOUS | Status: AC
  Filled 2022-01-15: qty 1

## 2022-01-15 MED ORDER — ALUM & MAG HYDROXIDE-SIMETH 200-200-20 MG/5ML PO SUSP: 30.0000 mL | Freq: Four times a day (QID) | ORAL | Status: DC | PRN

## 2022-01-15 MED ORDER — DIBUCAINE (PERIANAL) 1 % EX OINT
TOPICAL_OINTMENT | CUTANEOUS | Status: AC
  Filled 2022-01-15: qty 28

## 2022-01-15 MED ORDER — MIDAZOLAM HCL 2 MG/2ML IJ SOLN
INTRAMUSCULAR | Status: AC
  Filled 2022-01-15: qty 2

## 2022-01-15 MED ORDER — PROCHLORPERAZINE EDISYLATE 10 MG/2ML IJ SOLN: 5.0000 mg | INTRAMUSCULAR | Status: DC | PRN

## 2022-01-15 MED ORDER — KETAMINE HCL 10 MG/ML IJ SOLN
INTRAMUSCULAR | Status: DC | PRN
  Administered 2022-01-15: 30 mg via INTRAVENOUS
  Administered 2022-01-15: 20 mg via INTRAVENOUS

## 2022-01-15 MED ORDER — SODIUM CHLORIDE 0.9 % IV SOLN
2.0000 g | INTRAVENOUS | Status: AC
  Administered 2022-01-15: 2 g via INTRAVENOUS
  Filled 2022-01-15: qty 20

## 2022-01-15 MED ORDER — ONDANSETRON HCL 4 MG/2ML IJ SOLN
INTRAMUSCULAR | Status: AC
  Filled 2022-01-15: qty 2

## 2022-01-15 MED ORDER — LACTATED RINGERS IV BOLUS: 1000.0000 mL | Freq: Three times a day (TID) | INTRAVENOUS | Status: DC | PRN

## 2022-01-15 MED ORDER — ROCURONIUM BROMIDE 10 MG/ML (PF) SYRINGE
PREFILLED_SYRINGE | INTRAVENOUS | Status: AC
  Filled 2022-01-15: qty 10

## 2022-01-15 MED ORDER — METHYLENE BLUE 1 % INJ SOLN
INTRAVENOUS | Status: DC | PRN
  Administered 2022-01-15: 1 mL

## 2022-01-15 MED ORDER — PROPOFOL 10 MG/ML IV BOLUS
INTRAVENOUS | Status: AC
  Filled 2022-01-15: qty 20

## 2022-01-15 MED ORDER — SUGAMMADEX SODIUM 200 MG/2ML IV SOLN
INTRAVENOUS | Status: DC | PRN
Start: 2022-01-15 — End: 2022-01-15

## 2022-01-15 MED ORDER — MIDAZOLAM HCL 2 MG/2ML IJ SOLN
INTRAMUSCULAR | Status: DC | PRN
  Administered 2022-01-15: 2 mg via INTRAVENOUS

## 2022-01-15 MED ORDER — HYDROCORT-PRAMOXINE (PERIANAL) 2.5-1 % EX CREA: 1.0000 "application " | TOPICAL_CREAM | Freq: Four times a day (QID) | CUTANEOUS | Status: DC | PRN

## 2022-01-15 MED ORDER — DEXTROSE-NACL 5-0.9 % IV SOLN: INTRAVENOUS | Status: DC

## 2022-01-15 MED ORDER — PHENYLEPHRINE HCL-NACL 20-0.9 MG/250ML-% IV SOLN
INTRAVENOUS | Status: DC | PRN
  Administered 2022-01-15: 50 ug/min via INTRAVENOUS

## 2022-01-15 MED ORDER — DIPHENHYDRAMINE HCL 50 MG/ML IJ SOLN: 12.5000 mg | Freq: Four times a day (QID) | INTRAMUSCULAR | Status: DC | PRN

## 2022-01-15 MED ORDER — BUPIVACAINE-EPINEPHRINE (PF) 0.25% -1:200000 IJ SOLN
INTRAMUSCULAR | Status: AC
  Filled 2022-01-15: qty 30

## 2022-01-15 MED ORDER — BUPIVACAINE LIPOSOME 1.3 % IJ SUSP
INTRAMUSCULAR | Status: AC
  Filled 2022-01-15: qty 20

## 2022-01-15 MED ORDER — VITAMIN B-12 1000 MCG PO TABS
1000.0000 ug | ORAL_TABLET | Freq: Every day | ORAL | Status: DC
  Administered 2022-01-16: 1000 ug via ORAL
  Filled 2022-01-15: qty 1

## 2022-01-15 MED ORDER — PHENYLEPHRINE 80 MCG/ML (10ML) SYRINGE FOR IV PUSH (FOR BLOOD PRESSURE SUPPORT)
PREFILLED_SYRINGE | INTRAVENOUS | Status: DC | PRN
Start: 2022-01-15 — End: 2022-01-15
  Administered 2022-01-15 (×3): 160 ug via INTRAVENOUS

## 2022-01-15 MED ORDER — METHOCARBAMOL 1000 MG/10ML IJ SOLN
1000.0000 mg | Freq: Four times a day (QID) | INTRAMUSCULAR | Status: DC | PRN
Start: 2022-01-15 — End: 2022-01-16

## 2022-01-15 MED ORDER — ONDANSETRON HCL 4 MG/2ML IJ SOLN
4.0000 mg | Freq: Four times a day (QID) | INTRAMUSCULAR | Status: DC | PRN
Start: 2022-01-15 — End: 2022-01-16

## 2022-01-15 MED ORDER — BUPIVACAINE LIPOSOME 1.3 % IJ SUSP: 20.0000 mL | Freq: Once | INTRAMUSCULAR | Status: DC

## 2022-01-15 MED ORDER — ONDANSETRON 4 MG PO TBDP: 4.0000 mg | ORAL_TABLET | Freq: Four times a day (QID) | ORAL | Status: DC | PRN

## 2022-01-15 MED ORDER — ENALAPRILAT 1.25 MG/ML IV SOLN: 0.6250 mg | Freq: Four times a day (QID) | INTRAVENOUS | Status: DC | PRN

## 2022-01-15 MED ORDER — HYDROMORPHONE HCL 1 MG/ML IJ SOLN
0.2500 mg | INTRAMUSCULAR | Status: DC | PRN
  Administered 2022-01-15: 0.25 mg via INTRAVENOUS

## 2022-01-15 MED ORDER — SODIUM CHLORIDE 0.9% FLUSH
3.0000 mL | INTRAVENOUS | Status: DC | PRN
Start: 2022-01-15 — End: 2022-01-16

## 2022-01-15 MED ORDER — METHOCARBAMOL 500 MG PO TABS
1000.0000 mg | ORAL_TABLET | Freq: Four times a day (QID) | ORAL | Status: DC | PRN
Start: 2022-01-15 — End: 2022-01-16

## 2022-01-15 MED ORDER — PROPOFOL 10 MG/ML IV BOLUS
INTRAVENOUS | Status: DC | PRN
  Administered 2022-01-15: 140 mg via INTRAVENOUS
  Administered 2022-01-15: 30 mg via INTRAVENOUS

## 2022-01-15 MED ORDER — ONDANSETRON HCL 4 MG/2ML IJ SOLN: 4.0000 mg | Freq: Four times a day (QID) | INTRAMUSCULAR | Status: DC | PRN

## 2022-01-15 MED ORDER — ENOXAPARIN SODIUM 40 MG/0.4ML IJ SOSY: 40.0000 mg | PREFILLED_SYRINGE | INTRAMUSCULAR | Status: DC

## 2022-01-15 MED ORDER — KETAMINE HCL 50 MG/5ML IJ SOSY
PREFILLED_SYRINGE | INTRAMUSCULAR | Status: AC
  Filled 2022-01-15: qty 5

## 2022-01-15 MED ORDER — SUGAMMADEX SODIUM 200 MG/2ML IV SOLN
INTRAVENOUS | Status: DC | PRN
  Administered 2022-01-15: 200 mg via INTRAVENOUS

## 2022-01-15 MED ORDER — DEXAMETHASONE SODIUM PHOSPHATE 10 MG/ML IJ SOLN
INTRAMUSCULAR | Status: AC
  Filled 2022-01-15: qty 1

## 2022-01-15 MED ORDER — GABAPENTIN 300 MG PO CAPS: 300.0000 mg | ORAL_CAPSULE | ORAL | Status: AC

## 2022-01-15 MED ORDER — SUCCINYLCHOLINE CHLORIDE 200 MG/10ML IV SOSY
PREFILLED_SYRINGE | INTRAVENOUS | Status: AC
  Filled 2022-01-15: qty 10

## 2022-01-15 MED ORDER — OXYCODONE HCL 5 MG/5ML PO SOLN
ORAL | Status: AC
  Administered 2022-01-15: 5 mg via ORAL
  Filled 2022-01-15: qty 5

## 2022-01-15 MED ORDER — MENTHOL 3 MG MT LOZG
1.0000 | LOZENGE | OROMUCOSAL | Status: DC | PRN
Start: 2022-01-15 — End: 2022-01-16

## 2022-01-15 MED ORDER — DIBUCAINE (PERIANAL) 1 % EX OINT
TOPICAL_OINTMENT | CUTANEOUS | Status: DC | PRN
Start: 2022-01-15 — End: 2022-01-15
  Administered 2022-01-15: 1 via RECTAL

## 2022-01-15 MED ORDER — BUPIVACAINE LIPOSOME 1.3 % IJ SUSP
INTRAMUSCULAR | Status: DC | PRN
Start: 2022-01-15 — End: 2022-01-15
  Administered 2022-01-15: 20 mL

## 2022-01-15 MED ORDER — ENSURE PRE-SURGERY PO LIQD: 296.0000 mL | Freq: Once | ORAL | Status: DC

## 2022-01-15 SURGICAL SUPPLY — 40 items
BAG COUNTER SPONGE SURGICOUNT (BAG) IMPLANT
BENZOIN TINCTURE PRP APPL 2/3 (GAUZE/BANDAGES/DRESSINGS) ×2 IMPLANT
BLADE SURG 15 STRL LF DISP TIS (BLADE) IMPLANT
BLADE SURG 15 STRL SS (BLADE)
CNTNR URN SCR LID CUP LEK RST (MISCELLANEOUS) ×1 IMPLANT
CONT SPEC 4OZ STRL OR WHT (MISCELLANEOUS)
COVER SURGICAL LIGHT HANDLE (MISCELLANEOUS) ×2 IMPLANT
DRAPE LAPAROTOMY T 102X78X121 (DRAPES) ×2 IMPLANT
DRSG PAD ABDOMINAL 8X10 ST (GAUZE/BANDAGES/DRESSINGS) IMPLANT
ELECT REM PT RETURN 15FT ADLT (MISCELLANEOUS) ×2 IMPLANT
GAUZE 4X4 16PLY ~~LOC~~+RFID DBL (SPONGE) ×1 IMPLANT
GAUZE SPONGE 4X4 12PLY STRL (GAUZE/BANDAGES/DRESSINGS) IMPLANT
GLOVE ECLIPSE 8.0 STRL XLNG CF (GLOVE) ×2 IMPLANT
GLOVE INDICATOR 8.0 STRL GRN (GLOVE) ×2 IMPLANT
GOWN STRL REUS W/ TWL XL LVL3 (GOWN DISPOSABLE) ×3 IMPLANT
GOWN STRL REUS W/TWL XL LVL3 (GOWN DISPOSABLE) ×2
KIT BASIN OR (CUSTOM PROCEDURE TRAY) ×2 IMPLANT
KIT TURNOVER KIT A (KITS) ×1 IMPLANT
LOOP VESSEL MAXI BLUE (MISCELLANEOUS) ×1 IMPLANT
NDL SAFETY ECLIPSE 18X1.5 (NEEDLE) IMPLANT
NEEDLE HYPO 18GX1.5 SHARP (NEEDLE) ×2
NEEDLE HYPO 22GX1.5 SAFETY (NEEDLE) ×2 IMPLANT
PACK BASIC VI WITH GOWN DISP (CUSTOM PROCEDURE TRAY) ×1 IMPLANT
PACK GENERAL/GYN (CUSTOM PROCEDURE TRAY) ×1 IMPLANT
PANTS MESH DISP LRG (UNDERPADS AND DIAPERS) ×1 IMPLANT
PANTS MESH DISPOSABLE L (UNDERPADS AND DIAPERS) ×1
PENCIL SMOKE EVACUATOR (MISCELLANEOUS) ×1 IMPLANT
SHEARS HARMONIC 9CM CVD (BLADE) IMPLANT
SPIKE FLUID TRANSFER (MISCELLANEOUS) ×1 IMPLANT
SURGILUBE 2OZ TUBE FLIPTOP (MISCELLANEOUS) ×2 IMPLANT
SUT CHROMIC 2 0 SH (SUTURE) ×1 IMPLANT
SUT CHROMIC 3 0 SH 27 (SUTURE) IMPLANT
SUT SILK 2 0 SH (SUTURE) ×3 IMPLANT
SUT VIC AB 2-0 SH 27 (SUTURE)
SUT VIC AB 2-0 SH 27X BRD (SUTURE) IMPLANT
SUT VIC AB 2-0 UR6 27 (SUTURE) ×8 IMPLANT
SYR 20ML LL LF (SYRINGE) ×2 IMPLANT
SYR 3ML LL SCALE MARK (SYRINGE) ×1 IMPLANT
TOWEL OR 17X26 10 PK STRL BLUE (TOWEL DISPOSABLE) ×2 IMPLANT
TOWEL OR NON WOVEN STRL DISP B (DISPOSABLE) ×2 IMPLANT

## 2022-01-15 NOTE — Discharge Instructions (Signed)
##############################################  ANORECTAL SURGERY:  POST OPERATIVE INSTRUCTIONS  ######################################################################  EAT Start with a pureed / full liquid diet After 24 hours, gradually transition to a high fiber diet.    CONTROL PAIN Control pain so you can tolerate bowel movements,  walk, sleep, tolerate sneezing/coughing, and go up/down stairs.   HAVE A BOWEL MOVEMENT DAILY Keep your bowels regular to avoid problems.   Taking a fiber supplement every day to keep bowels soft.   Try a laxative to override constipation. Use an antidairrheal to slow down diarrhea.   Call if not better after 2 tries  WALK Walk an hour a day.  Control your pain to do that.   CALL IF YOU HAVE PROBLEMS/CONCERNS Call if you are still struggling despite following these instructions. Call if you have concerns not answered by these instructions  ######################################################################    Take your usually prescribed home medications unless otherwise directed.  DIET: Follow a light bland diet & liquids the first 24 hours after arrival home, such as soup, liquids, starches, etc.  Be sure to drink plenty of fluids.  Quickly advance to a usual solid diet within a few days.  Avoid fast food or heavy meals as your are more likely to get nauseated or have irregular bowels.  A low-fat, high-fiber diet for the rest of your life is ideal.  PAIN CONTROL: Expect swelling and discomfort in the anus/rectal area. Pain is best controlled by a usual combination of many methods TOGETHER: Warm baths/soaks or Ice packs Over the counter pain medication Prescription pain medications Topical creams    Warm water baths or ice packs (30-60 minutes up to 8 times a day, especially after bowel meovements) will help. Use ice for the first few days to help decrease swelling and bruising, then switch to heat such as warm towels, sitz baths, warm  baths, warm showers, etc to help relax tight/sore spots and speed recovery.  Some people prefer to use ice alone, heat alone, alternating between ice & heat.  Experiment to what works for you.    It is helpful to take an over-the-counter pain medication continuously for the first few weeks.  Choose one of the following that works best for you: Naproxen (Aleve, etc)  Two 212m tabs twice a day Ibuprofen (Advil, etc) Three 2023mtabs four times a day (every meal & bedtime) Acetaminophen (Tylenol, etc) 500-65078mour times a day (every meal & bedtime)  A  prescription for pain medication (such as oxycodone, hydrocodone, etc) should be given to you upon discharge.  Take your pain medication as prescribed.  If you are having problems/concerns with the prescription medicine (does not control pain, nausea, vomiting, rash, itching, etc), please call us [phone] see if we need to switch you to a different pain medicine that will work better for you and/or control your side effect better. If you need a refill on your pain medication, please contact your pharmacy.  They will contact our office to request authorization. Prescriptions will not be filled after 5 pm or on week-ends.  If can take up to 48 hours for it to be filled & ready so avoid waiting until you are down to thel ast pill.  A topical cream (Dibucaine) or a prescription for a cream (such as diltiazem 2% gel) may be given to you.  Many people find relief with topical creams.  Some people find it burns too much.  Experiment.  If it helps, use it.  If it burns, don't  using it.  You also may receive a prescription for diazepam, a muscle relaxant to help you to be able to urinate and defecate more easily.  It is safe to take a few doses with the other medications as long as you are not planning to drive or do anything intense.  Hopefully this can minimize the chance of needing a Foley catheter into your bladder     KEEP YOUR BOWELS  REGULAR The goal is one soft bowel movement a day Avoid getting constipated.  Between the surgery and the pain medications, it is common to experience some constipation.  Increasing fluid intake and taking a fiber supplement (such as Metamucil, Citrucel, FiberCon, MiraLax, etc) 2-4 times a day regularly will usually help prevent this problem from occurring.  A mild laxative (prune juice, Milk of Magnesia, MiraLax, etc) should be taken according to package directions if there are no bowel movements after 48 hours. Watch out for diarrhea.  If you have many loose bowel movements, simplify your diet to bland foods & liquids for a few days.  Stop any stool softeners and decrease your fiber supplement.  Switching to mild anti-diarrheal medications (Kayopectate, Pepto Bismol) can help.  Can try an imodium/loperamide dose.  If this worsens or does not improve, please call us.  Wound Care   a. You have some fluffed gauze on top of the anus to help catch drainage and bleeding.  Let the gauze fall off with the first bowel movement or shower.  It is okay to reinforce or replace as needed.  Bleeding is common at first and occasionally tapers off   You have a blue rubber band seton back in your chronic fistula tunnel from the rectum to your old tailbone.  It is there to keep the tunnel open and prevent an abscess from forming again.  That will need to stay for many more months, perhaps for years.  We will see.  b. Place soft cotton balls on the anus/wounds and use an absorbent pad in your underwear as needed to catch any drainage and help keep the area.  Try to use cotton over regular gauze as Kolls can stick and pull, causing pain.  Cotton will come off more easily.   c. Keep the area clean and dry.  Bathe / shower every day.  Keep the area clean by showering / bathing over the incision / wound.   It is okay to soak an open wound to help wash it.  Consider using a squeeze bottle filled with warm water to gently wash  the anal area.  Wet wipes or showers / gentle washing after bowel movements is often less traumatic than regular toilet paper.  Use a Sitz Bath 4-8 times a day for relief  A sitz bath is a warm water bath taken in the sitting position that covers only the hips and buttocks. It may be used for either healing or hygiene purposes. Sitz baths are also used to relieve pain, itching, or muscle spasms.  Gently cleaned the area and the heat will help lower spasm and offer better pain control.    Fill the bathtub half full with warm water. Sit in the water and open the drain a little. Turn on the warm water to keep the tub half full. Keep the water running constantly. Soak in the water for 15 to 20 minutes. After the sitz bath, pat the affected area dry first.   d. You will often notice bleeding, especially with bowel movements.  This should slow down by the end of the first week of surgery.  Sitting on an ice pack can help.   e. Expect some drainage.  You often will have some blood or yellow drainage with open wounds.  Sometimes she will get a little leaking of liquid stool until the incision/wounds have fully close down.  This should slow down by the end of the first week of surgery, but you will have occasional bleeding or drainage up to a few months after surgery.  Wear an absorbent pad or soft cotton gauze in your underwear until the drainage stops.  ACTIVITIES as tolerated:    You may resume regular (light) daily activities beginning the next day--such as daily self-care, walking, climbing stairs--gradually increasing activities as tolerated.  If you can walk 30 minutes without difficulty, it is safe to try more intense activity such as jogging, treadmill, bicycling, low-impact aerobics, swimming, etc. Save the most intensive and strenuous activity for last such as sit-ups, heavy lifting, contact sports, etc  Refrain from any heavy lifting or straining until you are off narcotics for pain control.    DO NOT PUSH THROUGH PAIN.  Let pain be your guide: If it hurts to do something, don't do it.  Pain is your body warning you to avoid that activity for another week until the pain goes down. You may drive when you are no longer taking prescription pain medication, you can comfortably sit for long periods of time, and you can safely maneuver your car and apply brakes. You may have sexual intercourse when it is comfortable.   FOLLOW UP in our office Please call [facility] at [phone] to set up an appointment to see your surgeon in the office for a follow-up appointment approximately 3 weeks after your surgery. Make sure that you call for this appointment the day you arrive home to ensure a convenient appointment time.  8. IF YOU HAVE DISABILITY OR FAMILY LEAVE FORMS, BRING THEM TO THE OFFICE FOR PROCESSING.  DO NOT GIVE THEM TO YOUR DOCTOR.        WHEN TO CALL us [phone]: Poor pain control Reactions / problems with new medications (rash/itching, nausea, etc)  Fever over 101.5 F (38.5 C) Inability to urinate Nausea and/or vomiting Worsening swelling or bruising Continued bleeding from incision. Increased pain, redness, or drainage from the incision  The clinic staff is available to answer your questions during regular business hours (8:30am-5pm).  Please don't hesitate to call and ask to speak to one of our nurses for clinical concerns.   A surgeon from [facility] is always on call at the hospitals   If you have a medical emergency, go to the nearest emergency room or call 911.    [facility], [place], [place], [place],   [address] ? MAIN: [phone] ? TOLL FREE: [phone] ? FAX [phone] [email or web address]  #####################################################

## 2022-01-15 NOTE — Progress Notes (Signed)
Pt very sleepy/somnolent this evening and unable to ambulate per postop orders. Pt did ambulate in rm and to the BR.

## 2022-01-15 NOTE — Progress Notes (Signed)
[facility]  PROGRESS NOTE  Shawn [name] WCB:[id number] DOB: Jan 21, 1986 DOA: 01/14/2022 PCP: Charlott Rakes, MD   Brief HPI:   36 year old male with past medical history of Crohn disease complicated by perirectal fistula x2 with perirectal abscesses, osteomyelitis of coccyx.  Patient had modified Lannette Donath procedure in November 2022 with resection of osteomyelitis.  He completed 4 weeks of antibiotics in December 2022 with apparent resolution of osteomyelitis and abscess.  Patient has been doing better apparently and was started on Remicade at that time. He continues to take Remicade every 2 months as scheduled.  Last dose was in May 2022.  Had last seton removed 3 weeks ago.  Since that time has been having a lot of perianal pain  He called [facility] office and was told to come to the ED for further evaluation.  In the ED CT abdomen/pelvis was done  Impression Rectal wall thickening with perirectal inflammation and in the soft tissue tract extending from the anus to multiple punctate foci of gas contained within an area of inflammatory soft tissue posterior to the rectum measuring approximally 4.1 x 2.2 cm, suggestive of a fistula with developing perirectal/perianal abscess. Additional soft tissue tract extending from the anterior aspect of the anus into a tiny fluid collection in the anterior peritoneum measuring approximally 1.3 cm also suggestive of a fistula with perineal abscess. These findings could be more definitively characterized by perianal fistula protocol pelvic MRI with and without contrast. Consider surgical consult.  Subjective   Patient seen and examined, still continues to have rectal pain.  Surgery has seen the patient and plan to take him for surgery for recurrent perirectal abscesses in the setting of chronic proctitis with multiple abscesses and fistula.   Assessment/Plan:    Anorectal fistula -General surgery plans to take him for surgery for a recurrent  perirectal abscesses  Crohn's proctitis with fistula -Patient on Remicade -Gastroenterology has been consulted for further recommendations regarding Remicade  History of osteomyelitis of coccyx -Seen on MRI in August 2022, secondary to chronic disease with fistula -Underwent Handly procedure and coccygectomy on 07/07/2021 -Completed cefdinir and metronidazole with apparent resolution per ID office note in December 2022  CKD stage III -Creatinine 1.25 -At baseline  Anemia of chronic disease -Hemoglobin has been running around 7-8 range -Hemoglobin is 11.5 on presentation, which is improvement from previous hemoglobin in November -MCV 103.3 -B12 less than 50 -We will start B12 supplementation during this hospitalization  Alcohol abuse -Patient only states occasional alcohol use -Last use was 2 weeks ago -Denies heavy alcohol use or history of alcohol withdrawal  Dilated cardiomyopathy -Seen on cath in 2040 -Recent echo showed normal EF, moderate LVH  Hypertension -Blood pressure has been elevated -Patient not on medications at home -will likely need antihypertensive therapy   Medications     acetaminophen  1,000 mg Oral On Call to OR   bupivacaine liposome  20 mL Infiltration Once   Chlorhexidine Gluconate Cloth  6 each Topical Once   feeding supplement  296 mL Oral Once   gabapentin  300 mg Oral On Call to OR   HYDROmorphone       oxyCODONE         Data Reviewed:   CBG:  No results for input(s): GLUCAP in the last 168 hours.  SpO2: 96 % O2 Flow Rate (L/min): 6 L/min    Vitals:   01/15/22 1049 01/15/22 1100 01/15/22 1115 01/15/22 1130  BP: (!) 169/93 (!) 156/93 135/90 (!) 142/101  Pulse: 95 94 81 78  Resp: 15 16 16 14   Temp: 97.8 F (36.6 C)     TempSrc:      SpO2: 100% 95% 96% 96%  Weight:      Height:          Data Reviewed:  Basic Metabolic Panel: Recent Labs  Lab 01/14/22 1615  NA 139  K 5.0  CL 112*  CO2 22  GLUCOSE 93  BUN 21*   CREATININE 1.25*  CALCIUM 8.6*    CBC: Recent Labs  Lab 01/14/22 1615  WBC 9.8  NEUTROABS 5.8  HGB 11.5*  HCT 34.0*  MCV 103.3*  PLT 310    LFT Recent Labs  Lab 01/14/22 1615  AST 17  ALT 22  ALKPHOS 112  BILITOT 0.5  PROT 8.0  ALBUMIN 3.8     Antibiotics: Anti-infectives (From admission, onward)    Start     Dose/Rate Route Frequency Ordered Stop   01/15/22 0900  cefTRIAXone (ROCEPHIN) 2 g in sodium chloride 0.9 % 100 mL IVPB        2 g 200 mL/hr over 30 Minutes Intravenous On call to O.R. 01/15/22 0813 01/15/22 0919   01/15/22 0800  piperacillin-tazobactam (ZOSYN) IVPB 3.375 g        3.375 g 12.5 mL/hr over 240 Minutes Intravenous Every 8 hours 01/15/22 0658     01/14/22 2030  piperacillin-tazobactam (ZOSYN) IVPB 3.375 g        3.375 g 12.5 mL/hr over 240 Minutes Intravenous  Once 01/14/22 2022 01/15/22 0107        DVT prophylaxis: SCDs  Code Status: Full code  Family Communication: No family at bedside   CONSULTS General surgery   Objective    Physical Examination:   General-appears in no acute distress Heart-S1-S2, regular, no murmur auscultated Lungs-clear to auscultation bilaterally, no wheezing or crackles auscultated Abdomen-soft, nontender, no organomegaly Extremities-no edema in the lower extremities Neuro-alert, oriented x3, no focal deficit noted   Status is: Inpatient: Crohn's proctitis with fistula       [place]   [facility] If 7PM-7AM, please contact night-coverage at [email or web address], Office  [phone]   01/15/2022, 12:04 PM  LOS: 0 days

## 2022-01-15 NOTE — Op Note (Signed)
01/15/2022  10:57 AM  PATIENT:  Shawn [name]  36 y.o. 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  INDICATION: Patient with history of complex perianal and perineal abscesses with Crohn's proctocolitis.  Followed by [facility] gastroenterology.  First abscess August 2022 with incision and drainage.  Complex recurrence requiring incision drainage and seton placement times 25 June 2021.  Placed on aggressive immunosuppression.  Has been on Remicade for many months.  2 setons removed in late January.  Last seen team removed earlier this month.  Unfortunately patient  felt worsening pain and swelling.  CT scan suspicious for recurrent abscess.  I recommended examination and surgical treatment:  The anatomy & physiology of the anorectal region was discussed.  We discussed the pathophysiology of anorectal abscess and fistula.  Differential diagnosis was discussed.  Natural history progression was discussed.   I stressed the importance of a bowel regimen to have daily soft bowel movements to minimize progression of disease.     The patient's condition is not adequately controlled.  Non-operative treatment has not healed the fistula.  Therefore, I recommended examination under anaesthesia to confirm the diagnosis and treat the fistula.  I discussed techniques that may be required such as fistulotomy, ligation by LIFT technique, and/or seton placement.  Benefits & alternatives discussed.  I noted a good likelihood this will help address the problem, but sometimes repeat operations and prolonged healing times may occur.  Risks such as bleeding, pain, recurrence, reoperation, incontinence, heart attack, death, and other risks were discussed.      Educational handouts further explaining the pathology, treatment options, and bowel regimen were given.  The patient expressed understanding & wishes to proceed.  We will work to coordinate surgery for a mutually convenient time.   OR FINDINGS: Patient had a persistent high anal fistula from lower anterior cleft at site of prior coccyx osteomyelitis through the rectum supersweet directly.  Complex tract with persistent chronic abscess cavity.    External location POSTERIOR MIDLINE  about 5 cm from anal verge.  Internal location : Posterior midline rectum about 2 cm from anal verge.   Seton replaced.  Excision of external tract more deeply with revision of wound to have shorter tract.   Thickening and swelling in posterior rectum especially left posterior.  However no evidence of low pelvic/rectal wall abscess status post  attempted aspiration and transrectal drainage.  Left anterior perineal chronic purulent drainage like a chronic hidradenitis at site of prior fistula tract.  Excision of scarring and fistula tract to healthier tissue.  No evidence of long fistula back to anus.  Wound left open.      DESCRIPTION:   Informed consent was confirmed. Patient underwent general anesthesia without difficulty. Patient was placed into prone/jacknife positioning.  The perianal region was prepped and draped in sterile fashion. Surgical timeout confirmed or plan.  I did digital rectal examination and then transitioned over to anoscopy to get a sense of the anatomy.  Findings as noted above.  Suspicious for persistent fistulous tract.  I did place a probe through the external opening.  Cannot prove any definite deeper tract and met resistance.  I therefore switched over to injecting the tract with methylene blue.  With this I was able to locate an internal opening.  The tract did not feel superficial, concerning for persistent versus recurrent hiatal fistula at site of prior coccygectomy.  No definite abscess located.   Using 18-gauge needle to sterilely aspirate along the perirectal lateral region to confirm no undrained abscesses in the ischial rectal spaces  Patient did have thickening of the posterior distal rectal wall especially towards the left side of involving a quarter of the circumference.  Felt more like edema but somewhat ballotable.  Hard to tell on CAT scan but a few dots of gas and possible small abscess there.  Since this was a suprasphincteric abscess I thought transrectal drainage was appropriate.  I used a curved 18-gauge needle to transrectally aspirate in the region and can find no definite purulence.  Was able to open up into the region and transrectally drain into the swelling with a right angle clamp into the left posterior midline thickened rectal wall over the center of the swelling.  Came through the rectal  wall completely and confirmed no definite deeper abscess.  However there was a chronic cavity consistent with the high anal fistula from before.    Rest of the perianal skin was clear except for in the left anterior perineum/perirectal region there was a 2.5 x 2 cm area of chronic thickening and scarring with some purulence.  Concern for small abscess on CAT scan.  I excised this region to leave a superficial broad flat wound.  Confirms no evidence of any persistent fistula tract anymore to the region.  Wound left open.  Some other scarring elsewhere but left alone to avoid over operating in a Crohn's patient  I returned to the fistula.  I was able to carefully place a lacrimal duct probe through the high anal fistula tract from the inferior intergluteal cleft to the low rectum and placed a blue vessel vascular tape seton.  Created a nice O shaped non-cutting seton secured with 2-0 silk suture x2.  I excised some of the external track and skin.  I closed some of the wound from the intergluteal cleft closer to the anal verge with interrupted deep and dermal 2-0 Vicryl suture to hopefully shorten the tract closer to the anal verge a little bit.  I avoided doing any aggressive debridement or surgery given his history of Crohn's and prior incision and drainages x2.  I reexamined the anal canal.   There was no major narrowing.  Hemostasis was excellent.  I repeated anoscopy and examination.  Hemostasis was good.  We placed fluff gauze  to onlay over the wounds.  No packing done.  Patient is extubated and hemodynamically stable in recovery room with pain controlled.  Had done aggressive anorectal and field block.  I called to reach the patient's significant other.  She did not answer on the phone.  We will try and update patient and loved ones later   Adin Hector, M.D., F.A.C.S. Gastrointestinal and Minimally Invasive Surgery [facility] [address], [place], [place]  [address] [phone] Main / Paging

## 2022-01-15 NOTE — Progress Notes (Signed)
Shawn [name] [id number] 08-Jan-1986  CARE TEAM:  PCP: Charlott Rakes, MD  Outpatient Care Team: Patient Care Team: Charlott Rakes, MD as PCP - General (Family Medicine) [name], MD as Consulting Physician (Colon and Rectal Surgery) Comer, Okey Regal, MD as Consulting Physician (Infectious Diseases) Ladene Artist, MD as Consulting Physician (Gastroenterology) Donnie Mesa, MD as Consulting Physician (General Surgery)  Inpatient Treatment Team: Treatment Team: Attending Provider: Nolon Nations, MD; Rounding Team: Fatima Blank, MD; Technician: Leda Quail, NT; Registered Nurse: Dorinda Hill, RN; Consulting Physician: Ladene Artist, MD; Pharmacist: Lenis Noon, [facility]   Problem List:   Principal Problem:   Crohn's proctitis, with fistula [facility]) Active Problems:   Hypertension   Dilated cardiomyopathy ([place])   Abscess, perirectal, x2    Alcohol abuse   Perirectal abscess   Anorectal fistula   Chronic disease anemia   CKD (chronic kidney disease) stage 3, GFR 30-59 ml/min (HCC)   Day of Surgery  TODAY   Assessment  Recurrent perirectal abscess in the setting of Crohn's proctitis with multiple abscesses and fistula  [facility] Stay = 0 days)  Plan:  I suspect that one of his prior fistula sites has plugged up and he has formed a new abscess.  I recommended anorectal examination under anesthesia with bile drainage abscess and replacement of seton.  Hopefully I can turn things around and not limit his need for further interventions.  He would be due for his Remicade in early July.  See if GI has any recommendations or adjustments but probably needs to continue the plan to follow-up with Remicade every 2 months  -VTE prophylaxis- SCDs, etc -mobilize as tolerated to help recovery  Disposition:  Disposition:  The patient is from: Home  Anticipate discharge to:  Home  Anticipated Date of Discharge is:  May 28,2023    Barriers to discharge:   Pending Clinical improvement (more likely than not)  Patient currently is NOT MEDICALLY STABLE for discharge from the hospital from a surgery standpoint.      I reviewed ED provider notes, last 24 h vitals and pain scores, last 48 h intake and output, last 24 h labs and trends, and last 24 h imaging results. I have reviewed this patient's available data, including medical history, events of note, test results, etc as part of my evaluation.  A significant portion of that time was spent in counseling.  Care during the described time interval was provided by me.  This care required moderate level of medical decision making.  01/15/2022    Subjective: (Chief complaint)  Patient still with pain.  Noted that pain and swelling on the anus started last weekend and gradually worsened.  Has not eaten since yesterday.  Objective:  Vital signs:  Vitals:   01/15/22 0100 01/15/22 0145 01/15/22 0223 01/15/22 0527  BP: 122/87 120/86 138/88 121/72  Pulse: (!) 57 (!) 58 (!) 52 61  Resp: 16 16 18 18   Temp:  98.1 F (36.7 C) 98.3 F (36.8 C) 98.6 F (37 C)  TempSrc:  Oral Oral Oral  SpO2: 94% 97% 100% 98%  Weight:      Height:        Last BM Date : 01/14/22  Intake/Output   Yesterday:  05/25 0701 - 05/26 0700 In: 1050 [IV Piggyback:1050] Out: -  This shift:  No intake/output data recorded.  Bowel function:  Flatus: YES  BM:  No  Drain: (No drain)   Physical Exam:  General: Pt awake/alert in mild  acute distress Eyes: PERRL, normal EOM.  Sclera clear.  No icterus Neuro: CN II-XII intact w/o focal sensory/motor deficits. Lymph: No head/neck/groin lymphadenopathy Psych:  No delerium/psychosis/paranoia.  Oriented x 4 HENT: Normocephalic, Mucus membranes moist.  No thrush Neck: Supple, No tracheal deviation.  No obvious thyromegaly Chest: No pain to chest wall compression.  Good respiratory excursion.  No audible wheezing CV:  Pulses intact.  Regular rhythm.  No major  extremity edema MS: Normal AROM mjr joints.  No obvious deformity Abdomen: Soft.  Nondistended.  Mildly tender at incisions only.  No evidence of peritonitis.  No incarcerated hernias. GU: Normal external genitalia.  No obvious hernias  Rectal: Posterior midline seton in place.  Swelling and tenderness appears to be more left posterior sided that seems to correlate with probable abscess on CAT scan.Marland Kitchen  Anterior and perineum less inflamed.   Ext:   No deformity.  No mjr edema.  No cyanosis Skin: No petechiae / purpurea.  No major sores.  Warm and dry    Results:   Cultures: Recent Results (from the past 720 hour(s))  Resp Panel by RT-PCR (Flu A&B, Covid) Anterior Nasal Swab     Status: None   Collection Time: 01/14/22  4:15 PM   Specimen: Anterior Nasal Swab  Result Value Ref Range Status   SARS Coronavirus 2 by RT PCR NEGATIVE NEGATIVE Final    Comment: (NOTE) SARS-CoV-2 target nucleic acids are NOT DETECTED.  The SARS-CoV-2 RNA is generally detectable in upper respiratory specimens during the acute phase of infection. The lowest concentration of SARS-CoV-2 viral copies this assay can detect is 138 copies/mL. A negative result does not preclude SARS-Cov-2 infection and should not be used as the sole basis for treatment or other patient management decisions. A negative result may occur with  improper specimen collection/handling, submission of specimen other than nasopharyngeal swab, presence of viral mutation(s) within the areas targeted by this assay, and inadequate number of viral copies(<138 copies/mL). A negative result must be combined with clinical observations, patient history, and epidemiological information. The expected result is Negative.  Fact Sheet for Patients:  EntrepreneurPulse.com.au  Fact Sheet for Healthcare Providers:  IncredibleEmployment.be  This test is no t yet approved or cleared by the [place] FDA and  has  been authorized for detection and/or diagnosis of SARS-CoV-2 by FDA under an Emergency Use Authorization (EUA). This EUA will remain  in effect (meaning this test can be used) for the duration of the COVID-19 declaration under Section 564(b)(1) of the Act, 21 U.S.C.section 360bbb-3(b)(1), unless the authorization is terminated  or revoked sooner.       Influenza A by PCR NEGATIVE NEGATIVE Final   Influenza B by PCR NEGATIVE NEGATIVE Final    Comment: (NOTE) The Xpert Xpress SARS-CoV-2/FLU/RSV plus assay is intended as an aid in the diagnosis of influenza from Nasopharyngeal swab specimens and should not be used as a sole basis for treatment. Nasal washings and aspirates are unacceptable for Xpert Xpress SARS-CoV-2/FLU/RSV testing.  Fact Sheet for Patients: EntrepreneurPulse.com.au  Fact Sheet for Healthcare Providers: IncredibleEmployment.be  This test is not yet approved or cleared by the [place] FDA and has been authorized for detection and/or diagnosis of SARS-CoV-2 by FDA under an Emergency Use Authorization (EUA). This EUA will remain in effect (meaning this test can be used) for the duration of the COVID-19 declaration under Section 564(b)(1) of the Act, 21 U.S.C. section 360bbb-3(b)(1), unless the authorization is terminated or revoked.  Performed at [facility], [place] [address], [place], [place] [address]     Labs: Results for orders placed or performed during the hospital encounter of 01/14/22 (from the past 48 hour(s))  CBC with Differential     Status: Abnormal   Collection Time: 01/14/22  4:15 PM  Result Value Ref Range   WBC 9.8 4.0 - 10.5 K/uL   RBC 3.29 (L) 4.22 - 5.81 MIL/uL   Hemoglobin 11.5 (L) 13.0 - 17.0 g/dL   HCT 34.0 (L) 39.0 - 52.0 %   MCV 103.3 (H) 80.0 - 100.0 fL   MCH 35.0 (H) 26.0 - 34.0 pg   MCHC 33.8 30.0 - 36.0 g/dL   RDW 15.7 (H) 11.5 - 15.5 %   Platelets 310 150 - 400 K/uL    nRBC 0.0 0.0 - 0.2 %   Neutrophils Relative % 58 %   Neutro Abs 5.8 1.7 - 7.7 K/uL   Lymphocytes Relative 30 %   Lymphs Abs 2.9 0.7 - 4.0 K/uL   Monocytes Relative 6 %   Monocytes Absolute 0.6 0.1 - 1.0 K/uL   Eosinophils Relative 5 %   Eosinophils Absolute 0.5 0.0 - 0.5 K/uL   Basophils Relative 1 %   Basophils Absolute 0.1 0.0 - 0.1 K/uL   Immature Granulocytes 0 %   Abs Immature Granulocytes 0.03 0.00 - 0.07 K/uL    Comment: Performed at [facility], [place] [address], [place], [place] [address]  Comprehensive metabolic panel     Status: Abnormal   Collection Time: 01/14/22  4:15 PM  Result Value Ref Range   Sodium 139 135 - 145 mmol/L   Potassium 5.0 3.5 - 5.1 mmol/L   Chloride 112 (H) 98 - 111 mmol/L   CO2 22 22 - 32 mmol/L   Glucose, Bld 93 70 - 99 mg/dL    Comment: Glucose reference range applies only to samples taken after fasting for at least 8 hours.   BUN 21 (H) 6 - 20 mg/dL   Creatinine, Ser 1.25 (H) 0.61 - 1.24 mg/dL   Calcium 8.6 (L) 8.9 - 10.3 mg/dL   Total Protein 8.0 6.5 - 8.1 g/dL   Albumin 3.8 3.5 - 5.0 g/dL   AST 17 15 - 41 U/L   ALT 22 0 - 44 U/L   Alkaline Phosphatase 112 38 - 126 U/L   Total Bilirubin 0.5 0.3 - 1.2 mg/dL   GFR, Estimated >60 >60 mL/min    Comment: (NOTE) Calculated using the CKD-EPI Creatinine Equation (2021)    Anion gap 5 5 - 15    Comment: Performed at [facility], [place] [address], [place], [place] [address]  Resp Panel by RT-PCR (Flu A&B, Covid) Anterior Nasal Swab     Status: None   Collection Time: 01/14/22  4:15 PM   Specimen: Anterior Nasal Swab  Result Value Ref Range   SARS Coronavirus 2 by RT PCR NEGATIVE NEGATIVE    Comment: (NOTE) SARS-CoV-2 target nucleic acids are NOT DETECTED.  The SARS-CoV-2 RNA is generally detectable in upper respiratory specimens during the acute phase of infection. The lowest concentration of SARS-CoV-2 viral copies this assay can detect is 138 copies/mL.  A negative result does not preclude SARS-Cov-2 infection and should not be used as the sole basis for treatment or other patient management decisions. A negative result may occur with  improper specimen collection/handling, submission of specimen other than nasopharyngeal swab, presence of viral mutation(s) within the areas targeted by this assay,  and inadequate number of viral copies(<138 copies/mL). A negative result must be combined with clinical observations, patient history, and epidemiological information. The expected result is Negative.  Fact Sheet for Patients:  EntrepreneurPulse.com.au  Fact Sheet for Healthcare Providers:  IncredibleEmployment.be  This test is no t yet approved or cleared by the [place] FDA and  has been authorized for detection and/or diagnosis of SARS-CoV-2 by FDA under an Emergency Use Authorization (EUA). This EUA will remain  in effect (meaning this test can be used) for the duration of the COVID-19 declaration under Section 564(b)(1) of the Act, 21 U.S.C.section 360bbb-3(b)(1), unless the authorization is terminated  or revoked sooner.       Influenza A by PCR NEGATIVE NEGATIVE   Influenza B by PCR NEGATIVE NEGATIVE    Comment: (NOTE) The Xpert Xpress SARS-CoV-2/FLU/RSV plus assay is intended as an aid in the diagnosis of influenza from Nasopharyngeal swab specimens and should not be used as a sole basis for treatment. Nasal washings and aspirates are unacceptable for Xpert Xpress SARS-CoV-2/FLU/RSV testing.  Fact Sheet for Patients: EntrepreneurPulse.com.au  Fact Sheet for Healthcare Providers: IncredibleEmployment.be  This test is not yet approved or cleared by the [place] FDA and has been authorized for detection and/or diagnosis of SARS-CoV-2 by FDA under an Emergency Use Authorization (EUA). This EUA will remain in effect (meaning this test can be used)  for the duration of the COVID-19 declaration under Section 564(b)(1) of the Act, 21 U.S.C. section 360bbb-3(b)(1), unless the authorization is terminated or revoked.  Performed at [facility], [place] [address], [place], [place] [address]   Urinalysis, Routine w reflex microscopic Urine, Clean Catch     Status: Abnormal   Collection Time: 01/14/22  7:20 PM  Result Value Ref Range   Color, Urine STRAW (A) YELLOW   APPearance CLEAR CLEAR   Specific Gravity, Urine 1.019 1.005 - 1.030   pH 6.0 5.0 - 8.0   Glucose, UA NEGATIVE NEGATIVE mg/dL   Hgb urine dipstick NEGATIVE NEGATIVE   Bilirubin Urine NEGATIVE NEGATIVE   Ketones, ur NEGATIVE NEGATIVE mg/dL   Protein, ur NEGATIVE NEGATIVE mg/dL   Nitrite NEGATIVE NEGATIVE   Leukocytes,Ua NEGATIVE NEGATIVE    Comment: Performed at [place] [address], [place], [place] [address]  Vitamin B12     Status: Abnormal   Collection Time: 01/14/22 10:58 PM  Result Value Ref Range   Vitamin B-12 <50 (L) 180 - 914 pg/mL    Comment: (NOTE) This assay is not validated for testing neonatal or myeloproliferative syndrome specimens for Vitamin B12 levels. Performed at [facility], [place] [address] [place], [place] [address]     Imaging / Studies: CT ABDOMEN PELVIS W CONTRAST  Result Date: 01/14/2022 CLINICAL DATA:  Concern for Crohn's exacerbation. EXAM: CT ABDOMEN AND PELVIS WITH CONTRAST TECHNIQUE: Multidetector CT imaging of the abdomen and pelvis was performed using the standard protocol following bolus administration of intravenous contrast. RADIATION DOSE REDUCTION: This exam was performed according to the departmental dose-optimization program which includes automated exposure control, adjustment of the mA and/or kV according to patient size and/or use of iterative reconstruction technique. CONTRAST:  151m OMNIPAQUE IOHEXOL 300 MG/ML  SOLN COMPARISON:  Multiple priors including most  recent CT June 23, 2021. FINDINGS: Lower chest: Hypoventilatory change in the lung bases. Hepatobiliary: No suspicious hepatic lesion. Gallbladder is unremarkable. No biliary ductal dilation. Pancreas: No pancreatic ductal dilation or evidence of acute inflammation. Spleen: No splenomegaly or  focal splenic lesion. Adrenals/Urinary Tract: Bilateral adrenal glands appear normal. No hydronephrosis. Kidneys demonstrate symmetric enhancement. Similar appearance of the bilateral renal cysts and renal lesions which are technically too small to accurately characterize but statistically likely to reflect cysts which in the absence of clinically indicated signs/symptoms require no independent follow-up. Urinary bladder is unremarkable for degree of distension. Stomach/Bowel: No radiopaque enteric contrast material was administered. Stomach is unremarkable for degree of distension. No pathologic dilation of small or large bowel. The appendix and terminal ileum appear normal. Rectal wall thickening with perirectal inflammation and multiple punctate foci of gas contained within an area of inflammatory soft tissue posterior to the rectum extending superiorly to the inferior aspect of the coccyx and inferior to the gluteal crease measuring approximally 4.1 x 2.2 cm on image 90/2 with a soft tissue tract extending to the anal verge. Soft tissue tract extending from the anterior aspect of the anus into a tiny fluid collection in the anterior peritoneum measuring approximally 1.3 cm on image 99/2. Vascular/Lymphatic: Normal caliber abdominal aorta. No pathologically enlarged abdominal or pelvic lymph nodes. Reproductive: Prostate is unremarkable. Other: No significant abdominopelvic free fluid. Musculoskeletal: Similar destructive erosion of the lower coccyx. IMPRESSION: Rectal wall thickening with perirectal inflammation and in the soft tissue tract extending from the anus to multiple punctate foci of gas contained within an area  of inflammatory soft tissue posterior to the rectum measuring approximally 4.1 x 2.2 cm, suggestive of a fistula with developing perirectal/perianal abscess. Additional soft tissue tract extending from the anterior aspect of the anus into a tiny fluid collection in the anterior peritoneum measuring approximally 1.3 cm also suggestive of a fistula with perineal abscess. These findings could be more definitively characterized by perianal fistula protocol pelvic MRI with and without contrast. Consider surgical consult. Electronically Signed   By: Dahlia Bailiff M.D.   On: 01/14/2022 18:46    Medications / Allergies: per chart  Antibiotics: Anti-infectives (From admission, onward)    Start     Dose/Rate Route Frequency Ordered Stop   01/15/22 0900  cefTRIAXone (ROCEPHIN) 2 g in sodium chloride 0.9 % 100 mL IVPB        2 g 200 mL/hr over 30 Minutes Intravenous On call to O.R. 01/15/22 0813 01/16/22 0559   01/15/22 0800  piperacillin-tazobactam (ZOSYN) IVPB 3.375 g        3.375 g 12.5 mL/hr over 240 Minutes Intravenous Every 8 hours 01/15/22 0658     01/14/22 2030  piperacillin-tazobactam (ZOSYN) IVPB 3.375 g        3.375 g 12.5 mL/hr over 240 Minutes Intravenous  Once 01/14/22 2022 01/15/22 0107         Note: Portions of this report may have been transcribed using voice recognition software. Every effort was made to ensure accuracy; however, inadvertent computerized transcription errors may be present.   Any transcriptional errors that result from this process are unintentional.    Adin Hector, MD, FACS, MASCRS Esophageal, Gastrointestinal & Colorectal Surgery Robotic and Minimally Invasive Surgery  [facility], [place]  [place], [place], [place] [address] [phone] Fax [phone] Main  CONTACT INFORMATION:  Weekday (9AM-5PM): Call [facility] main office at [phone]  Weeknight (5PM-9AM) or  Weekend/Holiday: Check [email or web address] (password " [email or web address]") for General Surgery [facility] coverage  (Please, do not use SecureChat as it is not reliable communication to operating surgeons for immediate patient care)      01/15/2022  8:14 AM

## 2022-01-15 NOTE — Progress Notes (Signed)
Initial Nutrition Assessment  INTERVENTION:   Once diet advanced: -Boost Breeze po TID, each supplement provides 250 kcal and 9 grams of protein -Multivitamin with minerals daily -Prosource Plus PO BID, each provides 100 kcals and 15g protein  -Recommend Vitamin B-12 supplementation  NUTRITION DIAGNOSIS:   Increased nutrient needs related to chronic illness (Crohn's disease) as evidenced by estimated needs.  GOAL:   Patient will meet greater than or equal to 90% of their needs  MONITOR:   PO intake, Supplement acceptance, Labs, Weight trends, I & O's, Skin  REASON FOR ASSESSMENT:   Malnutrition Screening Tool    ASSESSMENT:   36 yo male with a pmhx significant for Crohn's disease of the rectum with fistula. Admitted for Crohn's proctitis.  Patient currently in OR for rectal exam.  Pt NPO for procedure.  Per chart review, pt reported fluctuating appetite and eating only 1 meal a day. Has had 20 lbs of weight loss over the past year.   Will order Boost Breeze and Prosource supplements to aid in healing once diet advanced.  Per review of weight records, pt's weight has remained stable since November 2022.   Medications: D5 infusion, Lactated ringers  Labs reviewed:  Vitamin B-12 low (<50)  NUTRITION - FOCUSED PHYSICAL EXAM:  Pt in OR  Diet Order:   Diet Order             Diet NPO time specified Except for: Ice Chips, Sips with Meds  Diet effective ____           Diet NPO time specified Except for: Ice Chips  Diet effective now                   EDUCATION NEEDS:   Not appropriate for education at this time  Skin:  Skin Assessment: Skin Integrity Issues: Skin Integrity Issues:: Incisions Incisions: 5/26 rectum  Last BM:  5/25  Height:   Ht Readings from Last 1 Encounters:  01/14/22 6' 1"  (1.854 m)    Weight:   Wt Readings from Last 1 Encounters:  01/14/22 89.4 kg    BMI:  Body mass index is 25.99 kg/m.  Estimated Nutritional Needs:    Kcal:  2200-2400  Protein:  105-115g  Fluid:  2L/day  Clayton Bibles, MS, RD, LDN Inpatient Clinical Dietitian Contact information available via Amion

## 2022-01-15 NOTE — Consult Note (Addendum)
Consultation  Referring Provider:   Hospitalist Primary Care Physician:  Charlott Rakes, MD Primary Gastroenterologist:  Dr. Fuller Plan       Reason for Consultation:    Fleeta Emmer Crohn's disease with perianal involvement with subsequent osteomyelitis of coccyx s/p bone resection 06/2021, on Remicaide since Dec 2022 total of 5 infusions with last one being May 8th 4m/kg, presenting with recurrent fistula and abscess.   Impression    Crohn's proctitis, with fistula ([facility] 01/01/2021 colonoscopy for evaluation of rectal abnormality on both CT and MRI, rectal pain.  Exam to the terminal ileum which was normal.  Congested, indurated, friable mucosa from the distal to proximal rectum.  Biopsy pathology chronic colitis with focal activity.  07/24/2021 first Remicade infusion,, last infusion of Remicade 12/28/2021 Patient's had a total of 5 Remicade infusions 01/02/2021 TB GOLD Negative 01/02/2021  HepBsAG NON REACTIVE   07/22/2021 CRP 52.7  01/14/2022 WBC 9.8 HGB 11.5 MCV 103.3   Abscess, perirectal, x2  S/p EUA with Dr. GJohney Mainetoday  History of osteomyelitis 07/07/2021 Surgery with Dr. GJohney Maineurgent modified HLannette Donathprocedure and coccygectomy 08/05/2021 infectious disease office visit completed cefdinir and metronidazole  Chronic disease anemia CBC on 01/14/2022   WBC 9.8 HGB 11.5 MCV 103.3 Platelets 310 Anemia studies on 07/08/2021  Iron 18 Ferritin 193  01/14/22 B12 <50  Dilated cardiomyopathy ([facility] 11/2020 normal EF with moderate LVH  Alcohol abuse  CKD (chronic kidney disease) stage 3, GFR 30-59 ml/min (HCC) Likely contributing to anemia    Plan   -Status post EUA with Dr. GJohney Mainethis morning -- Surgery is following the patient, once he is stable from surgical perspective will suggest further outpatient GI follow up.  Recommend day prior or just before next dose of Remicaide, checking level with antibody testing for therapeutic drug monitoring.  - Patient is only on 532mkg  could consider increasing to 10 mg/kg versus patient being non responder to antiTNF and in that case could consider Rinvoq or Skyrizi  -Get ESR/CRP and send QOD thereafter to monitor clinical status -Continue maintenance fluids - On zosyn, consider ID consult with recent osteomyelitis.  - pain control per primary -On Lovenox and SCD for VTE prophylaxis.  Thank you for your kind consultation, we will continue to follow.         HPI:   SpJontez Redfields a [age].o. 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   01/01/2021 colonoscopy for evaluation of rectal abnormality on both CT and MRI, rectal pain.  Exam to the terminal ileum which was normal.  Congested, indurated, friable mucosa from the distal to proximal rectum.  Biopsy pathology chronic colitis with focal activity.  features nonspecific but include low-grade ischemia, medication effect, infectious process and IBD.  MD suspected Crohn's disease but pathology was not diagnostic. 04/19/2021 MR Pelvis: Progressive coccygeal osteomyelitis with 2.7 m abscess communicating with smaller 1.9 cm perirectal abscess via small sinus tract.  Pericoccygeal inflammation has worsened since April, perirectal inflammation has improved since April. 07/07/2021 Surgery with Dr. GrJohney Mainergent modified HaLannette Donathrocedure and coccygectomy 08/05/2021 infectious disease office visit completed cefdinir and metronidazole x4 weeks 12/29/21 Last seton removed by Dr. GrJohney Maine5/25/2023 saw Dr. WhDema Severinutpatient due to continuing perianal and coccyx pain 01/14/2022 CT abdomen pelvis with contrast rectal  wall thickening perirectal inflammation, soft tissue tract extending from anus to  multiple punctate foci of gas posterior rectum 4.1 x 2.2 cm suggestive of fistula with developing perirectal/perianal abscess.  Anterior aspect of anus tiny fluid collection into the anterior peritoneum measuring 1.3 cm  12/29/2021 had last seton removed.  Began to have worsening perianal pain at that time.  Much worse about a week ago.  Denies any significant drainage. Denies fever or chills. Denies nausea or vomiting. Has not missed any Remicade doses last 1 was 12/28/2020.  IBD history:Diagnosed 01/01/2021 after colonoscopy. Current medications and last dose:  07/24/2021 first Remicade infusion,   last infusion of Remicade 12/28/2021 Patient's had a total of 5 Remicade infusions 08/05/2021 office visit with Dr. Fuller Plan  Recent labs: 07/22/2021 CRP 52.7 07/22/2021 SED RATE 89 01/14/2022 WBC 9.8 HGB 11.5 MCV 103.3 Platelets 310 07/08/2021 Iron 18 Ferritin 193 B12 <50 01/14/2022 AST 17 ALT 22 Alkphos 112 TBili 0.5 TB GOLD 01/02/2021 Negative or TB skin if indeterminate.  HepBsAG 01/02/2021 NON REACTIVE     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   PO B12 initiated 12/31/20   Crohn's colitis ([place])    Dilated cardiomyopathy ([place]) 05/18/2013   By catheterization 2014    Fistula    Hypertension     Surgical History:  He  has a past surgical history that includes Mandible fracture surgery (2006); left and right heart catheterization with coronary angiogram (N/A, 11/21/2012); Colonoscopy with propofol (N/A, 01/01/2021); biopsy (01/01/2021); Incision and drainage abscess (N/A, 07/07/2021); and Placement of seton (N/A, 07/07/2021). Family History:  His family history includes Diabetes in his mother and sister. Social History:   reports that he has been smoking cigarettes. He has been smoking an average of .25 packs per day. He has never used smokeless tobacco. He reports current alcohol use. He reports that he does not use  drugs.  Prior to Admission medications   Medication Sig Start Date End Date Taking? Authorizing Provider  oxyCODONE-acetaminophen (PERCOCET) 7.5-325 MG tablet Take 1 tablet by mouth in the morning, at noon, in the evening, and at bedtime.   Yes [provider]  acetaminophen (TYLENOL) 500 MG tablet Take 2 tablets (1,000 mg total) by mouth every 6 (six) hours. Patient not taking: Reported on 01/14/2022 04/22/21   Nita Sells, MD  DULoxetine (CYMBALTA) 60 MG capsule Take 1 capsule (60 mg total) by mouth daily. For tail bone pain Patient not taking: Reported on 08/05/2021 05/27/21   Charlott Rakes, MD  multivitamin-iron-minerals-folic acid (CENTRUM) chewable tablet Chew 1 tablet by mouth daily. Patient not taking: Reported on 08/05/2021 07/10/21   [name], MD  pantoprazole (PROTONIX) 40 MG tablet Take 1 tablet (40 mg total) by mouth daily. Patient not taking: Reported on 01/14/2022 08/05/21   Ladene Artist, MD  polyethylene glycol powder [facility]) 17 GM/SCOOP powder Take 17 g by mouth daily. Patient not taking: Reported on 01/14/2022 05/27/21   Charlott Rakes, MD  vitamin B-12 1000 MCG tablet Take 1 tablet (1,000 mcg total) by mouth daily. Patient not taking: Reported on 01/14/2022 07/10/21   [name], MD    Current Facility-Administered Medications  Medication Dose Route Frequency Provider Last Rate Last Admin   acetaminophen (TYLENOL) tablet 650 mg  650 mg Oral Q6H PRN Cornett, Thomas, MD       Or   acetaminophen (TYLENOL) suppository 650 mg  650 mg Rectal Q6H PRN Cornett, Marcello Moores,  MD       dextrose 5 %-0.9 % sodium chloride infusion   Intravenous Continuous Cornett, Marcello Moores, MD 75 mL/hr at 01/15/22 0536 New Bag at 01/15/22 0536   enoxaparin (LOVENOX) injection 40 mg  40 mg Subcutaneous Q24H Cornett, Thomas, MD       HYDROmorphone (DILAUDID) injection 1 mg  1 mg Intravenous Q2H PRN Cornett, Marcello Moores, MD   1 mg at 01/15/22 0542   ondansetron (ZOFRAN-ODT)  disintegrating tablet 4 mg  4 mg Oral Q6H PRN Cornett, Marcello Moores, MD       Or   ondansetron (ZOFRAN) injection 4 mg  4 mg Intravenous Q6H PRN Cornett, Marcello Moores, MD       piperacillin-tazobactam (ZOSYN) IVPB 3.375 g  3.375 g Intravenous Q8H Jennette Kettle M, DO       Facility-Administered Medications Ordered in Other Encounters  Medication Dose Route Frequency Provider Last Rate Last Admin   bupivacaine liposome (EXPAREL) 1.3 % injection 266 mg  20 mL Infiltration Once [name], MD       Chlorhexidine Gluconate Cloth 2 % PADS 6 each  6 each Topical Once [name], MD       And   Chlorhexidine Gluconate Cloth 2 % PADS 6 each  6 each Topical Once [name], MD       feeding supplement (ENSURE PRE-SURGERY) liquid 296 mL  296 mL Oral Once [name], MD        Allergies as of 01/14/2022 - Review Complete 01/14/2022  Allergen Reaction Noted   Shrimp [shellfish allergy] Shortness Of Breath 05/16/2013   Nsaids Other (See Comments) 07/06/2021    Review of Systems:    Constitutional: No weight loss, fever, chills, weakness or fatigue HEENT: Eyes: No change in vision               Ears, Nose, Throat:  No change in hearing or congestion Skin: No rash or itching Cardiovascular: No chest pain, chest pressure or palpitations   Respiratory: No SOB or cough Gastrointestinal: See HPI and otherwise negative Genitourinary: No dysuria or change in urinary frequency Neurological: No headache, dizziness or syncope Musculoskeletal: No new muscle or joint pain Hematologic: No bleeding or bruising Psychiatric: No history of depression or anxiety     Physical Exam:  Vital signs in last 24 hours: Temp:  [98 F (36.7 C)-98.6 F (37 C)] 98.6 F (37 C) (05/26 0527) Pulse Rate:  [52-73] 61 (05/26 0527) Resp:  [10-18] 18 (05/26 0527) BP: (106-143)/(63-89) 121/72 (05/26 0527) SpO2:  [94 %-100 %] 98 % (05/26 0527) Weight:  [89.4 kg] 89.4 kg (05/25 1547)   Last BM recorded by nurses in past 5  days No data recorded  General: No acute distress, lethargic from recent procedure Head:  Normocephalic and atraumatic. Eyes: sclerae anicteric,conjunctive pink  Heart:  regular rate and rhythm Pulm: Clear anteriorly; no wheezing Abdomen:  Soft, Obese AB, Active bowel sounds. No tenderness . , No organomegaly appreciated. Extremities:  Without edema. Neurologic:   No focal deficits.  Skin:   Dry and intact without significant lesions or rashes. Psychiatric:  Cooperative, lethargic  LAB RESULTS: Recent Labs    01/14/22 1615  WBC 9.8  HGB 11.5*  HCT 34.0*  PLT 310   BMET Recent Labs    01/14/22 1615  NA 139  K 5.0  CL 112*  CO2 22  GLUCOSE 93  BUN 21*  CREATININE 1.25*  CALCIUM 8.6*   LFT Recent Labs    01/14/22 1615  PROT 8.0  ALBUMIN 3.8  AST 17  ALT 22  ALKPHOS 112  BILITOT 0.5   PT/INR No results for input(s): LABPROT, INR in the last 72 hours.  STUDIES: CT ABDOMEN PELVIS W CONTRAST  Result Date: 01/14/2022 CLINICAL DATA:  Concern for Crohn's exacerbation. EXAM: CT ABDOMEN AND PELVIS WITH CONTRAST TECHNIQUE: Multidetector CT imaging of the abdomen and pelvis was performed using the standard protocol following bolus administration of intravenous contrast. RADIATION DOSE REDUCTION: This exam was performed according to the departmental dose-optimization program which includes automated exposure control, adjustment of the mA and/or kV according to patient size and/or use of iterative reconstruction technique. CONTRAST:  132m OMNIPAQUE IOHEXOL 300 MG/ML  SOLN COMPARISON:  Multiple priors including most recent CT June 23, 2021. FINDINGS: Lower chest: Hypoventilatory change in the lung bases. Hepatobiliary: No suspicious hepatic lesion. Gallbladder is unremarkable. No biliary ductal dilation. Pancreas: No pancreatic ductal dilation or evidence of acute inflammation. Spleen: No splenomegaly or focal splenic lesion. Adrenals/Urinary Tract: Bilateral adrenal glands  appear normal. No hydronephrosis. Kidneys demonstrate symmetric enhancement. Similar appearance of the bilateral renal cysts and renal lesions which are technically too small to accurately characterize but statistically likely to reflect cysts which in the absence of clinically indicated signs/symptoms require no independent follow-up. Urinary bladder is unremarkable for degree of distension. Stomach/Bowel: No radiopaque enteric contrast material was administered. Stomach is unremarkable for degree of distension. No pathologic dilation of small or large bowel. The appendix and terminal ileum appear normal. Rectal wall thickening with perirectal inflammation and multiple punctate foci of gas contained within an area of inflammatory soft tissue posterior to the rectum extending superiorly to the inferior aspect of the coccyx and inferior to the gluteal crease measuring approximally 4.1 x 2.2 cm on image 90/2 with a soft tissue tract extending to the anal verge. Soft tissue tract extending from the anterior aspect of the anus into a tiny fluid collection in the anterior peritoneum measuring approximally 1.3 cm on image 99/2. Vascular/Lymphatic: Normal caliber abdominal aorta. No pathologically enlarged abdominal or pelvic lymph nodes. Reproductive: Prostate is unremarkable. Other: No significant abdominopelvic free fluid. Musculoskeletal: Similar destructive erosion of the lower coccyx. IMPRESSION: Rectal wall thickening with perirectal inflammation and in the soft tissue tract extending from the anus to multiple punctate foci of gas contained within an area of inflammatory soft tissue posterior to the rectum measuring approximally 4.1 x 2.2 cm, suggestive of a fistula with developing perirectal/perianal abscess. Additional soft tissue tract extending from the anterior aspect of the anus into a tiny fluid collection in the anterior peritoneum measuring approximally 1.3 cm also suggestive of a fistula with perineal  abscess. These findings could be more definitively characterized by perianal fistula protocol pelvic MRI with and without contrast. Consider surgical consult. Electronically Signed   By: JDahlia BailiffM.D.   On: 01/14/2022 18:46     AVladimir Crofts 01/15/2022, 8:04 AM  GI ATTENDING  History, laboratories, x-rays, prior endoscopy reports, and operative reports reviewed.  Patient seen and examined.  Agree with comprehensive consultation note as outlined above.  IMPRESSION: 1.  Crohn's disease with perirectal involvement.  On Remicade. 2.  Perirectal Crohn's disease with fistula and recurrent abscess status post operative intervention today  Recommendations: 1.  Standard postoperative management and treatment with antibiotics to clear pelvic sepsis 2.  No role for steroids or other agents at this time.  The patient would be due for his next routine Remicade infusion the first week of July.  At that time, drug and antibody levels could be obtained preinfusion (assuming his pelvic sepsis has cleared).  Determinations could be made thereafter regarding his medical regimen for Crohn's. 3.  Patient has scheduled follow-up with his primary gastroenterologist, Dr. Fuller Plan, January 28, 2022.  He should keep this appointment Nothing further to add at this time from GI medicine perspective.  We will sign off.  Available for questions or other relevant issues.  Thanks.  Docia Chuck. Geri Seminole., M.D. [facility] Division of Gastroenterology

## 2022-01-15 NOTE — Anesthesia Preprocedure Evaluation (Addendum)
Anesthesia Evaluation  Patient identified by MRN, date of birth, ID band Patient awake    Reviewed: Allergy & Precautions, NPO status , Patient's Chart, lab work & pertinent test results  Airway Mallampati: II  TM Distance: >3 FB Neck ROM: Full    Dental no notable dental hx. (+) Dental Advisory Given, Teeth Intact   Pulmonary neg pulmonary ROS, Current Smoker and Patient abstained from smoking.,    Pulmonary exam normal breath sounds clear to auscultation       Cardiovascular hypertension, Normal cardiovascular exam Rhythm:Regular Rate:Normal  Echo 11/2020 1. Left ventricular ejection fraction, by estimation, is 65 to 70%. The left ventricle has normal function. The left ventricle has no regional wall motion abnormalities. There is moderate left ventricular hypertrophy. Left ventricular diastolic parameters were normal.  2. Right ventricular systolic function is normal. The right ventricular size is normal. Tricuspid regurgitation signal is inadequate for assessing PA pressure.  3. The mitral valve is normal in structure. No evidence of mitral valve regurgitation. No evidence of mitral stenosis.  4. The aortic valve is tricuspid. Aortic valve regurgitation is not visualized. No aortic stenosis is present.  5. The inferior vena cava is normal in size with greater than 50% respiratory variability, suggesting right atrial pressure of 3 mmHg.    Neuro/Psych negative neurological ROS     GI/Hepatic negative GI ROS, Neg liver ROS,   Endo/Other  negative endocrine ROS  Renal/GU Renal InsufficiencyRenal disease     Musculoskeletal negative musculoskeletal ROS (+)   Abdominal   Peds  Hematology  (+) Blood dyscrasia, anemia ,   Anesthesia Other Findings   Reproductive/Obstetrics                           Anesthesia Physical Anesthesia Plan  ASA: 3 and emergent  Anesthesia Plan: General   Post-op  Pain Management: Tylenol PO (pre-op)* and Ketamine IV*   Induction: Intravenous, Rapid sequence and Cricoid pressure planned  PONV Risk Score and Plan: 2 and Ondansetron, Treatment may vary due to age or medical condition, Midazolam and Dexamethasone  Airway Management Planned: Oral ETT  Additional Equipment:   Intra-op Plan:   Post-operative Plan: Extubation in OR  Informed Consent: I have reviewed the patients History and Physical, chart, labs and discussed the procedure including the risks, benefits and alternatives for the proposed anesthesia with the patient or authorized representative who has indicated his/her understanding and acceptance.     Dental advisory given  Plan Discussed with: CRNA  Anesthesia Plan Comments:        Anesthesia Quick Evaluation

## 2022-01-15 NOTE — Anesthesia Postprocedure Evaluation (Signed)
Anesthesia Post Note  Patient: Shawn [name]  Procedure(s) Performed: RECTAL EXAM UNDER ANESTHESIA PLACEMENT OF SETON     Patient location during evaluation: PACU Anesthesia Type: General Level of consciousness: sedated and patient cooperative Pain management: pain level controlled Vital Signs Assessment: post-procedure vital signs reviewed and stable Respiratory status: spontaneous breathing Cardiovascular status: stable Anesthetic complications: no   No notable events documented.  Last Vitals:  Vitals:   01/15/22 1305 01/15/22 1401  BP: 122/74 115/84  Pulse: (!) 55 (!) 55  Resp: 18   Temp: 36.6 C 36.6 C  SpO2: 94% 97%    Last Pain:  Vitals:   01/15/22 1305  TempSrc: Oral  PainSc:                  Nolon Nations

## 2022-01-15 NOTE — Progress Notes (Signed)
  Transition of Care [facility]) Screening Note   Patient Details  Name: Shantel Wesely Date of Birth: 1986/04/24   Transition of Care [facility]) CM/SW Contact:    Vassie Moselle, LCSW Phone Number: 01/15/2022, 12:38 PM    Transition of Care Department [facility]) has reviewed patient and no TOC needs have been identified at this time. We will continue to monitor patient advancement through interdisciplinary progression rounds. If new patient transition needs arise, please place a TOC consult.

## 2022-01-15 NOTE — Transfer of Care (Signed)
Immediate Anesthesia Transfer of Care Note  Patient: Shawn [name]  Procedure(s) Performed: RECTAL EXAM UNDER ANESTHESIA PLACEMENT OF SETON  Patient Location: PACU  Anesthesia Type:General  Level of Consciousness: drowsy  Airway & Oxygen Therapy: Patient Spontanous Breathing and Patient connected to face mask oxygen  Post-op Assessment: Report given to RN and Post -op Vital signs reviewed and stable  Post vital signs: Reviewed and stable  Last Vitals:  Vitals Value Taken Time  BP 169/93 01/15/22 1049  Temp    Pulse 95 01/15/22 1050  Resp 15 01/15/22 1050  SpO2 100 % 01/15/22 1050    Last Pain:  Vitals:   01/15/22 0844  TempSrc:   PainSc: 8       Patients Stated Pain Goal: 3 (08/56/94 3700)  Complications: No notable events documented.

## 2022-01-15 NOTE — Anesthesia Procedure Notes (Signed)
Procedure Name: Intubation Date/Time: 01/15/2022 9:10 AM Performed by: Sharlette Dense, CRNA Pre-anesthesia Checklist: Patient identified, Emergency Drugs available, Suction available and Patient being monitored Patient Re-evaluated:Patient Re-evaluated prior to induction Oxygen Delivery Method: Circle system utilized Preoxygenation: Pre-oxygenation with 100% oxygen Induction Type: IV induction, Rapid sequence and Cricoid Pressure applied Laryngoscope Size: Miller and 3 Grade View: Grade I Tube type: Oral Tube size: 8.0 mm Number of attempts: 1 Airway Equipment and Method: Stylet and Oral airway Placement Confirmation: ETT inserted through vocal cords under direct vision, positive ETCO2 and breath sounds checked- equal and bilateral Secured at: 22 cm Tube secured with: Tape Dental Injury: Teeth and Oropharynx as per pre-operative assessment

## 2022-01-16 ENCOUNTER — Encounter (HOSPITAL_COMMUNITY): Payer: Self-pay | Admitting: Surgery

## 2022-01-16 DIAGNOSIS — K611 Rectal abscess: Secondary | ICD-10-CM | POA: Diagnosis not present

## 2022-01-16 DIAGNOSIS — F1010 Alcohol abuse, uncomplicated: Secondary | ICD-10-CM | POA: Diagnosis not present

## 2022-01-16 DIAGNOSIS — K50113 Crohn's disease of large intestine with fistula: Secondary | ICD-10-CM | POA: Diagnosis not present

## 2022-01-16 LAB — COMPREHENSIVE METABOLIC PANEL
ALT: 23 U/L (ref 0–44)
AST: 19 U/L (ref 15–41)
Albumin: 3.2 g/dL — ABNORMAL LOW (ref 3.5–5.0)
Alkaline Phosphatase: 100 U/L (ref 38–126)
Anion gap: 7 (ref 5–15)
BUN: 25 mg/dL — ABNORMAL HIGH (ref 6–20)
CO2: 20 mmol/L — ABNORMAL LOW (ref 22–32)
Calcium: 8.6 mg/dL — ABNORMAL LOW (ref 8.9–10.3)
Chloride: 109 mmol/L (ref 98–111)
Creatinine, Ser: 1.84 mg/dL — ABNORMAL HIGH (ref 0.61–1.24)
GFR, Estimated: 48 mL/min — ABNORMAL LOW (ref >60)
Glucose, Bld: 127 mg/dL — ABNORMAL HIGH (ref 70–99)
Potassium: 4.8 mmol/L (ref 3.5–5.1)
Sodium: 136 mmol/L (ref 135–145)
Total Bilirubin: 0.6 mg/dL (ref 0.3–1.2)
Total Protein: 6.9 g/dL (ref 6.5–8.1)

## 2022-01-16 LAB — CBC
HCT: 31.3 % — ABNORMAL LOW (ref 39.0–52.0)
Hemoglobin: 10.6 g/dL — ABNORMAL LOW (ref 13.0–17.0)
MCH: 34.9 pg — ABNORMAL HIGH (ref 26.0–34.0)
MCHC: 33.9 g/dL (ref 30.0–36.0)
MCV: 103 fL — ABNORMAL HIGH (ref 80.0–100.0)
Platelets: 308 10*3/uL (ref 150–400)
RBC: 3.04 MIL/uL — ABNORMAL LOW (ref 4.22–5.81)
RDW: 14.9 % (ref 11.5–15.5)
WBC: 16.8 10*3/uL — ABNORMAL HIGH (ref 4.0–10.5)
nRBC: 0 % (ref 0.0–0.2)

## 2022-01-16 MED ORDER — AMOXICILLIN-POT CLAVULANATE 875-125 MG PO TABS: 1.0000 | ORAL_TABLET | Freq: Two times a day (BID) | ORAL | 3 refills | Status: DC

## 2022-01-16 MED ORDER — OXYCODONE HCL 5 MG PO TABS: 5.0000 mg | ORAL_TABLET | Freq: Four times a day (QID) | ORAL | 0 refills | Status: DC | PRN

## 2022-01-16 NOTE — Progress Notes (Signed)
Assessment unchanged. Pt verbalized understanding of dc instructions through teach back including medications and follow up care.  Discharged to front entrance accompanied by NT.

## 2022-01-16 NOTE — Plan of Care (Signed)
  Problem: Education: Goal: Knowledge of General Education information will improve Description: Including pain rating scale, medication(s)/side effects and non-pharmacologic comfort measures Outcome: Adequate for Discharge   Problem: Health Behavior/Discharge Planning: Goal: Ability to manage health-related needs will improve Outcome: Adequate for Discharge   Problem: Clinical Measurements: Goal: Ability to maintain clinical measurements within normal limits will improve Outcome: Adequate for Discharge Goal: Will remain free from infection Outcome: Adequate for Discharge Goal: Diagnostic test results will improve Outcome: Adequate for Discharge Goal: Respiratory complications will improve Outcome: Adequate for Discharge Goal: Cardiovascular complication will be avoided Outcome: Adequate for Discharge   Problem: Activity: Goal: Risk for activity intolerance will decrease Outcome: Adequate for Discharge   Problem: Nutrition: Goal: Adequate nutrition will be maintained Outcome: Adequate for Discharge   Problem: Coping: Goal: Level of anxiety will decrease Outcome: Adequate for Discharge   Problem: Elimination: Goal: Will not experience complications related to bowel motility Outcome: Adequate for Discharge Goal: Will not experience complications related to urinary retention Outcome: Adequate for Discharge   Problem: Pain Managment: Goal: General experience of comfort will improve Outcome: Adequate for Discharge   Problem: Safety: Goal: Ability to remain free from injury will improve Outcome: Adequate for Discharge   Problem: Skin Integrity: Goal: Risk for impaired skin integrity will decrease Outcome: Adequate for Discharge   Problem: Increased Nutrient Needs (NI-5.1) Goal: Food and/or nutrient delivery Description: Individualized approach for food/nutrient provision. Outcome: Adequate for Discharge   

## 2022-01-16 NOTE — Progress Notes (Signed)
[facility]  PROGRESS NOTE  Shawn [name] FTD:[id number] DOB: May 13, 1986 DOA: 01/14/2022 PCP: Charlott Rakes, MD   Brief HPI:   36 year old male with past medical history of Crohn disease complicated by perirectal fistula x2 with perirectal abscesses, osteomyelitis of coccyx.  Patient had modified Lannette Donath procedure in November 2022 with resection of osteomyelitis.  He completed 4 weeks of antibiotics in December 2022 with apparent resolution of osteomyelitis and abscess.  Patient has been doing better apparently and was started on Remicade at that time. He continues to take Remicade every 2 months as scheduled.  Last dose was in May 2022.  Had last seton removed 3 weeks ago.  Since that time has been having a lot of perianal pain  He called [facility] office and was told to come to the ED for further evaluation.  In the ED CT abdomen/pelvis was done  Impression Rectal wall thickening with perirectal inflammation and in the soft tissue tract extending from the anus to multiple punctate foci of gas contained within an area of inflammatory soft tissue posterior to the rectum measuring approximally 4.1 x 2.2 cm, suggestive of a fistula with developing perirectal/perianal abscess. Additional soft tissue tract extending from the anterior aspect of the anus into a tiny fluid collection in the anterior peritoneum measuring approximally 1.3 cm also suggestive of a fistula with perineal abscess. These findings could be more definitively characterized by perianal fistula protocol pelvic MRI with and without contrast. Consider surgical consult.  Subjective   Patient seen and examined, still continues to have rectal pain.  Surgery has seen the patient and plan to take him for surgery for recurrent perirectal abscesses in the setting of chronic proctitis with multiple abscesses and fistula.   Assessment/Plan:    Anorectal fistula -S/p transrectal drainage, replacement of seton, excision of  perineal sinus tract  Crohn's proctitis with fistula -Patient on Remicade -Underwent surgery as above  History of osteomyelitis of coccyx -Seen on MRI in August 2022, secondary to chronic disease with fistula -Underwent Handly procedure and coccygectomy on 07/07/2021 -Completed cefdinir and metronidazole with apparent resolution per ID office note in December 2022  CKD stage III -Creatinine 1.84 -At baseline  Anemia of chronic disease -Hemoglobin has been running around 7-8 range -Hemoglobin is 11.5 on presentation, which is improvement from previous hemoglobin in November -MCV 103.3 -B12 less than 50 -Started on B12 supplementation during this hospitalization  Alcohol abuse -Patient only states occasional alcohol use -Last use was 2 weeks ago -Denies heavy alcohol use or history of alcohol withdrawal  Dilated cardiomyopathy -Seen on cath in 2040 -Recent echo showed normal EF, moderate LVH  Hypertension -Blood pressure has been elevated - likely from pain - BP is stable now  Patient will be discharged today   Medications     acetaminophen  1,000 mg Oral Q6H   bupivacaine liposome  20 mL Infiltration Once   feeding supplement  296 mL Oral Once   lip balm  1 application. Topical BID   liver oil-zinc oxide   Topical BID   pantoprazole  40 mg Oral Daily   polycarbophil  625 mg Oral BID   sodium chloride flush  3 mL Intravenous Q12H   cyanocobalamin  1,000 mcg Oral Daily     Data Reviewed:   CBG:  No results for input(s): GLUCAP in the last 168 hours.  SpO2: 100 % O2 Flow Rate (L/min): 6 L/min    Vitals:   01/15/22 1812 01/15/22 2032 01/16/22 0129 01/16/22 0524  BP: 118/77 114/70 122/74 127/82  Pulse: 70 76 68 61  Resp: 18 16 16 16   Temp: 98.6 F (37 C) 98.5 F (36.9 C) 98.9 F (37.2 C) 98.9 F (37.2 C)  TempSrc: Oral Oral Oral Oral  SpO2: 100% 98% 98% 100%  Weight:      Height:          Data Reviewed:  Basic Metabolic Panel: Recent Labs   Lab 01/14/22 1615 01/16/22 0405  NA 139 136  K 5.0 4.8  CL 112* 109  CO2 22 20*  GLUCOSE 93 127*  BUN 21* 25*  CREATININE 1.25* 1.84*  CALCIUM 8.6* 8.6*    CBC: Recent Labs  Lab 01/14/22 1615 01/16/22 0405  WBC 9.8 16.8*  NEUTROABS 5.8  --   HGB 11.5* 10.6*  HCT 34.0* 31.3*  MCV 103.3* 103.0*  PLT 310 308    LFT Recent Labs  Lab 01/14/22 1615 01/16/22 0405  AST 17 19  ALT 22 23  ALKPHOS 112 100  BILITOT 0.5 0.6  PROT 8.0 6.9  ALBUMIN 3.8 3.2*     Antibiotics: Anti-infectives (From admission, onward)    Start     Dose/Rate Route Frequency Ordered Stop   01/16/22 0000  amoxicillin-clavulanate (AUGMENTIN) 875-125 MG tablet        1 tablet Oral 2 times daily 01/16/22 0839     01/15/22 0900  cefTRIAXone (ROCEPHIN) 2 g in sodium chloride 0.9 % 100 mL IVPB        2 g 200 mL/hr over 30 Minutes Intravenous On call to O.R. 01/15/22 0813 01/15/22 0919   01/15/22 0800  piperacillin-tazobactam (ZOSYN) IVPB 3.375 g        3.375 g 12.5 mL/hr over 240 Minutes Intravenous Every 8 hours 01/15/22 0658     01/14/22 2030  piperacillin-tazobactam (ZOSYN) IVPB 3.375 g        3.375 g 12.5 mL/hr over 240 Minutes Intravenous  Once 01/14/22 2022 01/15/22 0107        DVT prophylaxis: SCDs  Code Status: Full code  Family Communication: No family at bedside   CONSULTS General surgery   Objective    Physical Examination:   General-appears in no acute distress Heart-S1-S2, regular, no murmur auscultated Lungs-clear to auscultation bilaterally, no wheezing or crackles auscultated Abdomen-soft, nontender, no organomegaly Extremities-no edema in the lower extremities Neuro-alert, oriented x3, no focal deficit noted   Status is: Inpatient: Crohn's proctitis with fistula       [place]   [facility] If 7PM-7AM, please contact night-coverage at [email or web address], Office  [phone]   01/16/2022, 8:44 AM  LOS: 1 day

## 2022-01-16 NOTE — Discharge Summary (Signed)
Physician Discharge Summary  Patient ID: Shawn [name] MRN: [id number] DOB/AGE: [birth year] 36 y.o.  Admit date: 01/14/2022 Discharge date: 01/16/2022  Admission Diagnoses:  Crohn's disease with proctitis  Anal fistula  Discharge Diagnoses: Same Principal Problem:   Crohn's proctitis, with fistula ([name]) Active Problems:   Hypertension   Dilated cardiomyopathy ([name])   Abscess, perirectal, x2    Alcohol abuse   Perirectal abscess   Anorectal fistula   Chronic disease anemia   CKD (chronic kidney disease) stage 3, GFR 30-59 ml/min [facility])   Discharged Condition: good  Hospital Course: Admitted 5/25 for worsening perianal pain and perirectal abscess.  He underwent surgery by Dr. Johney Maine on 5/26.  He feels better today and is eager to go home  Consults: GI and Medicine    Treatments: surgery: TRANSRECTAL DRAINAGE REPLACEMENT OF SETON EXCISION OF PERINEAL SINUS TRACT ANORECTAL EXAM UNDER ANESTHESIA  Discharge Exam: Blood pressure 127/82, pulse 61, temperature 98.9 F (37.2 C), temperature source Oral, resp. rate 16, height 6' 1"  (1.854 m), weight 89.4 kg, SpO2 100 %.  Seton in place - some drainage  Disposition: Discharge disposition: 01-Home or Self Care       Discharge Instructions     Call MD for:  persistant nausea and vomiting   Complete by: As directed    Call MD for:  redness, tenderness, or signs of infection (pain, swelling, redness, odor or green/yellow discharge around incision site)   Complete by: As directed    Call MD for:  severe uncontrolled pain   Complete by: As directed    Call MD for:  temperature >100.4   Complete by: As directed    Diet general   Complete by: As directed    Driving Restrictions   Complete by: As directed    Do not drive while taking pain medications   Increase activity slowly   Complete by: As directed    May shower / Bathe   Complete by: As directed       Allergies as of 01/16/2022       Reactions   Shrimp  [shellfish Allergy] Shortness Of Breath   Nsaids Other (See Comments)   CROHN'S DISEASE = NO NSAIDS        Medication List     STOP taking these medications    oxyCODONE-acetaminophen 7.5-325 MG tablet Commonly known as: PERCOCET       TAKE these medications    acetaminophen 500 MG tablet Commonly known as: TYLENOL Take 2 tablets (1,000 mg total) by mouth every 6 (six) hours.   amoxicillin-clavulanate 875-125 MG tablet Commonly known as: AUGMENTIN Take 1 tablet by mouth 2 (two) times daily.   cyanocobalamin 1000 MCG tablet Take 1 tablet (1,000 mcg total) by mouth daily.   DULoxetine 60 MG capsule Commonly known as: Cymbalta Take 1 capsule (60 mg total) by mouth daily. For tail bone pain   multivitamin-iron-minerals-folic acid chewable tablet Chew 1 tablet by mouth daily.   oxyCODONE 5 MG immediate release tablet Commonly known as: Oxy IR/ROXICODONE Take 1 tablet (5 mg total) by mouth every 6 (six) hours as needed for severe pain.   pantoprazole 40 MG tablet Commonly known as: PROTONIX Take 1 tablet (40 mg total) by mouth daily.   polyethylene glycol powder 17 GM/SCOOP powder Commonly known as: GLYCOLAX/MIRALAX Take 17 g by mouth daily.        Follow-up Information     [name], MD Follow up in 3 week(s).   Specialties: General Surgery, Colon  and Rectal Surgery Why: To follow up after your operation, To follow up after your hospital stay Contact information: [address] [place] [address] [phone]                 Signed: [name] 01/16/2022, 8:39 AM

## 2022-01-19 ENCOUNTER — Telehealth: Payer: Self-pay

## 2022-01-19 LAB — SURGICAL PATHOLOGY

## 2022-01-19 NOTE — Telephone Encounter (Signed)
Transition Care Management Follow-up Telephone Call Date of discharge and from where: 01/16/2022, [facility] How have you been since you were released from the hospital? He stated he is feeling all right except a little sore.  Any questions or concerns? No  Items Reviewed: Did the pt receive and understand the discharge instructions provided? Yes  Medications obtained and verified? Yes - he said he has all of his medications and did not have any questions about the med regime.  Other? No  Any new allergies since your discharge? No  Dietary orders reviewed? No Do you have support at home?  Lives alone   Home Care and Equipment/Supplies: Were home health services ordered? no If so, what is the name of the agency? N/a  Has the agency set up a time to come to the patient's home? not applicable Were any new equipment or medical supplies ordered?  No What is the name of the medical supply agency? N/a Were you able to get the supplies/equipment? not applicable Do you have any questions related to the use of the equipment or supplies? No  Functional Questionnaire: (I = Independent and D = Dependent) ADLs: independent  Follow up appointments reviewed:  PCP Hospital f/u appt confirmed?  He said he will call to schedule an appointment. He didn't want to schedule anything now.    [facility] f/u appt confirmed? Yes  Scheduled to see GI- 01/28/2022 and he said he does not have an appointment with the surgeon but was told to call them if he has questions/concerns. Are transportation arrangements needed? No  If their condition worsens, is the pt aware to call PCP or go to the Emergency Dept.? Yes Was the patient provided with contact information for the PCP's office or ED? Yes Was to pt encouraged to call back with questions or concerns? Yes

## 2022-01-19 NOTE — Telephone Encounter (Signed)
From the discharge call:  He stated he is feeling all right except a little sore.  he said he has all of his medications and did not have any questions about the med regime.  He said he will call to schedule an appointment. He didn't want to schedule anything now.    Scheduled to see GI- 01/28/2022 and he said he does not have an appointment with the surgeon but was told to call them if he has questions/concerns.

## 2022-01-21 NOTE — Telephone Encounter (Addendum)
[facility] is currently out of network with [place].  Patient does not have out of network insurance.  Message sent to credentialing team: Orlene Och Awaiting response.  Request has been sent to Dr. Fuller Plan to refer patient to another site to prevent delay in treatment.

## 2022-01-28 ENCOUNTER — Ambulatory Visit (INDEPENDENT_AMBULATORY_CARE_PROVIDER_SITE_OTHER): Payer: Commercial Managed Care - HMO | Admitting: Gastroenterology

## 2022-01-28 ENCOUNTER — Encounter: Payer: Self-pay | Admitting: Gastroenterology

## 2022-01-28 VITALS — BP 114/76 | HR 72 | Ht 73.0 in | Wt 199.4 lb

## 2022-01-28 DIAGNOSIS — K50113 Crohn's disease of large intestine with fistula: Secondary | ICD-10-CM | POA: Diagnosis not present

## 2022-01-28 NOTE — Progress Notes (Signed)
Assessment     Crohn's proctitis with recurrent anal fistulae and recurrent perirectal abscesses.   Recommendations    Complete course of Augmentin as recommended. Schedule follow-up with Dr. Johney Maine Continue infliximab for now  Infliximab trough level and antibodies just prior to next infusion on July 7.  Based on those result consider infliximab dose, timing adjustments or change in medication.  Consider referral to a tertiary center given his complicated course.   HPI    This is a 36 year old male with Crohn's proctitis with perianal disease, anal fistula, perirectal abscesses.  Also complicated by suspected osteomyelitis of the coccyx in 2022 and he had a prolonged course of antibiotics.   Remicade was started in December 2022.  He was hospitalized in late May with worsening perianal pain and a perirectal abscess.  On May 25 underwent transrectal drainage and placement of a seton with excision of a perineal sinus tract by Dr. Johney Maine.  He relates that his perirectal pain has improved since surgery but it persists.  Previous perirectal abscess drainage in August 2022, in November 2022 with seton placement and in January 2023 with seton placement.  He requested refill of oxycodone which is being filled by other providers.   Colonoscopy May 2022 - The examined portion of the ileum was normal. - Congested, indurated, friable mucosa in the rectum. Biopsied. - The examination was otherwise normal on direct and retroflexion views.  RECTUM, BIOPSY:  -  Chronic colitis with focal activity  -  No granulomata, dysplasia or malignancy identified  -  See comment  COMMENT:  The histologic features are non-specific and can be seen in low grade  ischemia, medication effect, self-limited infectious processes and  inflammatory bowel disease.  Clinical-pathologic correlation is  recommended.    Labs / Imaging       Latest Ref Rng & Units 01/16/2022    4:05 AM 01/14/2022    4:15 PM 07/10/2021     4:48 AM  Hepatic Function  Total Protein 6.5 - 8.1 g/dL 6.9  8.0  6.9   Albumin 3.5 - 5.0 g/dL 3.2  3.8  2.9   AST 15 - 41 U/L _0 ALT 0 - 44 U/L _1 Alk Phosphatase 38 - 126 U/L 100  112  77   Total Bilirubin 0.3 - 1.2 mg/dL 0.6  0.5  0.4        Latest Ref Rng & Units 01/16/2022    4:05 AM 01/14/2022    4:15 PM 07/10/2021    4:48 AM  CBC  WBC 4.0 - 10.5 K/uL 16.8  9.8  9.9   Hemoglobin 13.0 - 17.0 g/dL 10.6  11.5  7.5   Hematocrit 39.0 - 52.0 % 31.3  34.0  23.0   Platelets 150 - 400 K/uL 308  310  495      CT ABDOMEN PELVIS W CONTRAST  Result Date: 01/14/2022 CLINICAL DATA:  Concern for Crohn's exacerbation. EXAM: CT ABDOMEN AND PELVIS WITH CONTRAST TECHNIQUE: Multidetector CT imaging of the abdomen and pelvis was performed using the standard protocol following bolus administration of intravenous contrast. RADIATION DOSE REDUCTION: This exam was performed according to the departmental dose-optimization program which includes automated exposure control, adjustment of the mA and/or kV according to patient size and/or use of iterative reconstruction technique. CONTRAST:  192m OMNIPAQUE IOHEXOL 300 MG/ML  SOLN COMPARISON:  Multiple priors including most recent CT June 23, 2021. FINDINGS:  Lower chest: Hypoventilatory change in the lung bases. Hepatobiliary: No suspicious hepatic lesion. Gallbladder is unremarkable. No biliary ductal dilation. Pancreas: No pancreatic ductal dilation or evidence of acute inflammation. Spleen: No splenomegaly or focal splenic lesion. Adrenals/Urinary Tract: Bilateral adrenal glands appear normal. No hydronephrosis. Kidneys demonstrate symmetric enhancement. Similar appearance of the bilateral renal cysts and renal lesions which are technically too small to accurately characterize but statistically likely to reflect cysts which in the absence of clinically indicated signs/symptoms require no independent follow-up. Urinary bladder is  unremarkable for degree of distension. Stomach/Bowel: No radiopaque enteric contrast material was administered. Stomach is unremarkable for degree of distension. No pathologic dilation of small or large bowel. The appendix and terminal ileum appear normal. Rectal wall thickening with perirectal inflammation and multiple punctate foci of gas contained within an area of inflammatory soft tissue posterior to the rectum extending superiorly to the inferior aspect of the coccyx and inferior to the gluteal crease measuring approximally 4.1 x 2.2 cm on image 90/2 with a soft tissue tract extending to the anal verge. Soft tissue tract extending from the anterior aspect of the anus into a tiny fluid collection in the anterior peritoneum measuring approximally 1.3 cm on image 99/2. Vascular/Lymphatic: Normal caliber abdominal aorta. No pathologically enlarged abdominal or pelvic lymph nodes. Reproductive: Prostate is unremarkable. Other: No significant abdominopelvic free fluid. Musculoskeletal: Similar destructive erosion of the lower coccyx. IMPRESSION: Rectal wall thickening with perirectal inflammation and in the soft tissue tract extending from the anus to multiple punctate foci of gas contained within an area of inflammatory soft tissue posterior to the rectum measuring approximally 4.1 x 2.2 cm, suggestive of a fistula with developing perirectal/perianal abscess. Additional soft tissue tract extending from the anterior aspect of the anus into a tiny fluid collection in the anterior peritoneum measuring approximally 1.3 cm also suggestive of a fistula with perineal abscess. These findings could be more definitively characterized by perianal fistula protocol pelvic MRI with and without contrast. Consider surgical consult. Electronically Signed   By: Dahlia Bailiff M.D.   On: 01/14/2022 18:46    Current Medications, Allergies, Past Medical History, Past Surgical History, Family History and Social History were reviewed  in [employer] record.   Physical Exam: General: Well developed, well nourished, no acute distress Head: Normocephalic and atraumatic Eyes: Sclerae anicteric, EOMI Ears: Normal auditory acuity Mouth: Not examined Lungs: Clear throughout to auscultation Heart: Regular rate and rhythm; no murmurs, rubs or bruits Abdomen: Soft, non tender and non distended. No masses, hepatosplenomegaly or hernias noted. Normal Bowel sounds Rectal: Not done Musculoskeletal: Symmetrical with no gross deformities  Pulses:  Normal pulses noted Extremities: No clubbing, cyanosis, edema or deformities noted Neurological: Alert oriented x 4, grossly nonfocal Psychological:  Alert and cooperative. Normal mood and affect   Bora Bost T. Fuller Plan, MD 01/28/2022, 8:41 AM

## 2022-01-28 NOTE — Patient Instructions (Signed)
Your provider has requested that you go to the basement level for lab work on 02/25/22. Press "B" on the elevator. The lab is located at the first door on the left as you exit the elevator.  Please keep your Remicade appointment on 02/26/22.   Please follow up with Dr. Fuller Plan in 3 months.  The  GI providers would like to encourage you to use [facility] to communicate with providers for non-urgent requests or questions.  Due to long hold times on the telephone, sending your provider a message by [facility] may be a faster and more efficient way to get a response.  Please allow 48 business hours for a response.  Please remember that this is for non-urgent requests.   Due to recent changes in healthcare laws, you may see the results of your imaging and laboratory studies on MyChart before your provider has had a chance to review them.  We understand that in some cases there may be results that are confusing or concerning to you. Not all laboratory results come back in the same time frame and the provider may be waiting for multiple results in order to interpret others.  Please give [place] 48 hours in order for your provider to thoroughly review all the results before contacting the office for clarification of your results.   Thank you for choosing me and [facility].  Pricilla Riffle. Dagoberto Ligas., MD., Marval Regal

## 2022-02-12 ENCOUNTER — Telehealth: Payer: Self-pay | Admitting: Pharmacy Technician

## 2022-02-12 ENCOUNTER — Other Ambulatory Visit: Payer: Self-pay | Admitting: Pharmacy Technician

## 2022-02-12 NOTE — Telephone Encounter (Signed)
[place] is fine however I thought he was already receiving infliximab

## 2022-02-17 NOTE — Telephone Encounter (Addendum)
[place] co-pay card:APPROVED ID: [id number] BIN: [id number] PCN: [id number] GR: [id number] [place]: [phone] Claims can be uploaded in portal: [email or web address]

## 2022-02-24 ENCOUNTER — Encounter: Payer: Self-pay | Admitting: Gastroenterology

## 2022-02-24 ENCOUNTER — Telehealth: Payer: Self-pay | Admitting: Pharmacy Technician

## 2022-02-24 NOTE — Telephone Encounter (Addendum)
Patient has new insurance [employer])  and [facility] is currently out of [place].  We are working on the issue and hopeful it will be resolved as soon as possible. To prevent delay in treatment please refer patient to another site.

## 2022-02-25 NOTE — Telephone Encounter (Signed)
[facility] form has been placed on Dr. Lynne Leader desk for signature. Will fax once completed.

## 2022-02-26 ENCOUNTER — Ambulatory Visit: Payer: Self-pay

## 2022-03-01 ENCOUNTER — Telehealth: Payer: Self-pay

## 2022-03-01 NOTE — Telephone Encounter (Signed)
Received call from [facility] regarding patient's infusions. She plans to contact Wynetta Fines with [facility] to arrange a "bridge dose" so that patient can have infusion soon since we are currently working on the paperwork to transfer patient to [place].

## 2022-03-02 NOTE — Telephone Encounter (Signed)
Patient information form faxed to [facility].

## 2022-03-10 ENCOUNTER — Telehealth: Payer: Self-pay

## 2022-03-10 NOTE — Telephone Encounter (Signed)
Patient called in to find out when his next infusion would be. On 7/10, [employer] insurance was suppose to be setting up the "bridge dose" infusion with [place] reaching out to patient, however he has yet to hear from them. Will call today to find out status.

## 2022-03-10 NOTE — Telephone Encounter (Signed)
Spoke with Maudie Mercury from [facility] [place] this morning & she stated they had not heard anything from [facility] in regards to setting up a bridge dose. I called & spoke with [employer] and they stated that the bridge dose is not currently in the works, and to proceed with transferring patient to [facility]. Records have been re-faxed to [facility] since they stated they had not received forms.

## 2022-03-11 NOTE — Telephone Encounter (Signed)
Confirmed with [facility] that they did receive patient paperwork. She stated they are currently verifying insurance & then will contact patient and set up infusion within 2-3 weeks. Unable to reach patient to update him on status, line rings then disconnects.

## 2022-03-12 NOTE — Telephone Encounter (Signed)
Unable to reach patient, line rings then disconnects.

## 2022-03-15 NOTE — Telephone Encounter (Signed)
Pt notified through mychart & is aware to call back in a couple weeks if he has not heard from [facility] regarding an appointment.

## 2022-03-15 NOTE — Telephone Encounter (Signed)
Patient called in & was made aware that [facility] is currently verifying his insurance and will contact him within 2 weeks to schedule infusion. Pt verbalized all understanding & will call back in 2 weeks if he has not heard from them regarding an appt.

## 2022-03-18 ENCOUNTER — Telehealth: Payer: Self-pay

## 2022-03-18 NOTE — Telephone Encounter (Signed)
Received call from [facility] questioning whether or not patient still needed bridge dose & that they had sent the authorization to [place] last week. I told her that per my conversation last week with [place] they never received any authorization & that my conversation with Christella Scheuermann I was told that the bridge dose was not currently in the works, and to proceed with setting up infusions with [facility]. New number has been provided to [facility] & they plan to reach out to Wynetta Fines with [place].

## 2022-03-22 ENCOUNTER — Telehealth: Payer: Self-pay

## 2022-03-22 ENCOUNTER — Telehealth: Payer: Self-pay | Admitting: Gastroenterology

## 2022-03-22 NOTE — Telephone Encounter (Signed)
-----   Message from Wynetta Fines, CPhT sent at 03/22/2022  3:48 PM EDT ----- Donita Brooks,  Per [employer] he will not qualify for the bridge program if the facility is out of network with the insurance company. Since he has Pharmacist, community he will be eligible for the [employer] co-pay card for financial assistance.   The PA was denied due to [place] not being in network with his [employer], (it was not a medical denial).  This is a credentialing issue and we are working on this and hopeful it will be resolved soon.  To prevent delay in treatment he will need to be seen at another facility that's in network with his insurance  Thanks Kim. ----- Message ----- From: Carl Best, RN Sent: 03/22/2022  10:34 AM EDT To: Wynetta Fines, CPhT  Hi Joelene Millin, has anyone from [employer] reached out to you regarding setting up a bridge dose for this patient? I talked with you a couple of weeks ago, and at that time they had not. His insurance called me again on Friday, and stated they were going to contact you so just thought I would check again.

## 2022-03-22 NOTE — Telephone Encounter (Signed)
Patient called requesting information about getting his infusion soon as he missed the one for last month.  He wants to talk to you asap about setting this up.  Thank you.

## 2022-03-22 NOTE — Telephone Encounter (Signed)
Spoke with patient & he stated [facility] reached out to him today in regards to payment/insurance. Scheduling date is TBD. I have already made [facility] aware of the importance of getting this pt scheduled d/t his missed dose. I've advised patient to call back on Friday if he has not heard an update.

## 2022-03-22 NOTE — Telephone Encounter (Signed)
Staff message sent to Wynetta Fines with [facility] infusion center in regards to bridge dose that insurance spoke with me about on Friday 7/27. Spoke with [employer] who stated the insurance has been verified, and that patient will be received a call any day this week in regards to pricing and scheduling.

## 2022-03-22 NOTE — Telephone Encounter (Signed)
Inbound call from patient inquiring if he can go to his nearest urgent care or hospital to have an infusions done. Poke with a nurse and advised patient someone will give him a call back at his earliest convenience.  Thank you

## 2022-03-25 ENCOUNTER — Telehealth: Payer: Self-pay | Admitting: Gastroenterology

## 2022-03-25 ENCOUNTER — Other Ambulatory Visit: Payer: Self-pay

## 2022-03-25 ENCOUNTER — Encounter (HOSPITAL_COMMUNITY): Payer: Self-pay

## 2022-03-25 ENCOUNTER — Emergency Department (HOSPITAL_COMMUNITY): Payer: Commercial Managed Care - HMO

## 2022-03-25 ENCOUNTER — Emergency Department (HOSPITAL_COMMUNITY)
Admission: EM | Admit: 2022-03-25 | Discharge: 2022-03-25 | Disposition: A | Discharge status: Home / Self Care | Payer: Commercial Managed Care - HMO | Attending: Emergency Medicine | Admitting: Emergency Medicine

## 2022-03-25 DIAGNOSIS — K50913 Crohn's disease, unspecified, with fistula: Secondary | ICD-10-CM | POA: Insufficient documentation

## 2022-03-25 DIAGNOSIS — K6289 Other specified diseases of anus and rectum: Secondary | ICD-10-CM | POA: Diagnosis present

## 2022-03-25 LAB — CBC WITH DIFFERENTIAL/PLATELET
Abs Immature Granulocytes: 0.02 10*3/uL (ref 0.00–0.07)
Basophils Absolute: 0.1 10*3/uL (ref 0.0–0.1)
Basophils Relative: 1 %
Eosinophils Absolute: 0.5 10*3/uL (ref 0.0–0.5)
Eosinophils Relative: 6 %
HCT: 37.6 % — ABNORMAL LOW (ref 39.0–52.0)
Hemoglobin: 12.9 g/dL — ABNORMAL LOW (ref 13.0–17.0)
Immature Granulocytes: 0 %
Lymphocytes Relative: 26 %
Lymphs Abs: 2 10*3/uL (ref 0.7–4.0)
MCH: 33.4 pg (ref 26.0–34.0)
MCHC: 34.3 g/dL (ref 30.0–36.0)
MCV: 97.4 fL (ref 80.0–100.0)
Monocytes Absolute: 0.4 10*3/uL (ref 0.1–1.0)
Monocytes Relative: 5 %
Neutro Abs: 4.7 10*3/uL (ref 1.7–7.7)
Neutrophils Relative %: 62 %
Platelets: 294 10*3/uL (ref 150–400)
RBC: 3.86 MIL/uL — ABNORMAL LOW (ref 4.22–5.81)
RDW: 13.7 % (ref 11.5–15.5)
WBC: 7.7 10*3/uL (ref 4.0–10.5)
nRBC: 0 % (ref 0.0–0.2)

## 2022-03-25 LAB — COMPREHENSIVE METABOLIC PANEL WITH GFR
ALT: 27 U/L (ref 0–44)
AST: 23 U/L (ref 15–41)
Albumin: 3.3 g/dL — ABNORMAL LOW (ref 3.5–5.0)
Alkaline Phosphatase: 121 U/L (ref 38–126)
Anion gap: 7 (ref 5–15)
BUN: 21 mg/dL — ABNORMAL HIGH (ref 6–20)
CO2: 17 mmol/L — ABNORMAL LOW (ref 22–32)
Calcium: 8.5 mg/dL — ABNORMAL LOW (ref 8.9–10.3)
Chloride: 114 mmol/L — ABNORMAL HIGH (ref 98–111)
Creatinine, Ser: 1.52 mg/dL — ABNORMAL HIGH (ref 0.61–1.24)
GFR, Estimated: >60 mL/min
Glucose, Bld: 104 mg/dL — ABNORMAL HIGH (ref 70–99)
Potassium: 4.5 mmol/L (ref 3.5–5.1)
Sodium: 138 mmol/L (ref 135–145)
Total Bilirubin: 0.7 mg/dL (ref 0.3–1.2)
Total Protein: 7.7 g/dL (ref 6.5–8.1)

## 2022-03-25 MED ORDER — HYDROCODONE-ACETAMINOPHEN 5-325 MG PO TABS
1.0000 | ORAL_TABLET | Freq: Once | ORAL | Status: AC
  Administered 2022-03-25: 1 via ORAL
  Filled 2022-03-25: qty 1

## 2022-03-25 MED ORDER — HYDROMORPHONE HCL 1 MG/ML IJ SOLN
1.0000 mg | Freq: Once | INTRAMUSCULAR | Status: AC
  Administered 2022-03-25: 1 mg via INTRAVENOUS
  Filled 2022-03-25: qty 1

## 2022-03-25 MED ORDER — SODIUM CHLORIDE 0.9 % IV BOLUS
1000.0000 mL | Freq: Once | INTRAVENOUS | Status: AC
  Administered 2022-03-25: 1000 mL via INTRAVENOUS

## 2022-03-25 MED ORDER — IOHEXOL 300 MG/ML  SOLN
100.0000 mL | Freq: Once | INTRAMUSCULAR | Status: AC | PRN
  Administered 2022-03-25: 100 mL via INTRAVENOUS

## 2022-03-25 NOTE — Discharge Instructions (Signed)
If you develop worsening, continued, or recurrent abdominal pain, uncontrolled vomiting, fever, chest or back pain, or any other new/concerning symptoms then return to the ER for evaluation.  

## 2022-03-25 NOTE — ED Provider Triage Note (Signed)
Emergency Medicine Provider Triage Evaluation Note  Shawn [name] , a 36 y.o. male  was evaluated in triage.  Pt complains of rectal pain.  Patient states he has a history of Crohn's disease with known perianal fistula.  He has been getting infusions for his Crohn disease every 8 weeks with his last one being in June.  He recently had a insurance dilemma where he switched infusion centers and has not been able to get his August infusion.  His anal pain as well as rectal bleeding has been worse over the 3 to 4 days.  He said he came to the emergency department because "he did not know what else to do."  Denies fever, chills, night sweats, chest pain, shortness of breath, abdominal pain, vomiting.  Review of Systems  Positive: See above Negative:   Physical Exam  BP (!) 133/93 (BP Location: Right Arm)   Pulse 84   Temp 98.5 F (36.9 C) (Oral)   Resp 16   SpO2 100%  Gen:   Awake, no distress   Resp:  Normal effort  MSK:   Moves extremities without difficulty  Other:  No tenderness to palpation of abdomen.  Lungs clear to auscultation  Medical Decision Making  Medically screening exam initiated at 10:42 AM.  Appropriate orders placed.  Paulette Lynch was informed that the remainder of the evaluation will be completed by another provider, this initial triage assessment does not replace that evaluation, and the importance of remaining in the ED until their evaluation is complete.     Wilnette Kales, [place] 03/25/22 1045

## 2022-03-25 NOTE — ED Notes (Signed)
Pt refused vital update.

## 2022-03-25 NOTE — ED Notes (Signed)
Pt in triage hallway in recliner.

## 2022-03-25 NOTE — Telephone Encounter (Signed)
Patient of Dr. Fuller Plan with known Crohn's proctitis with perianal fistula that required surgical treatment and seton placement, last seen in the office June 8.  Phone messages earlier this week indicate difficulties with patient getting his most recent infliximab infusion.  Came to ED today with chronic rectal pain and bleeding.  Patient had concerns that he may have recurrence of abscess, but none seen on CT scan.  No fever or elevated WBC.  Patient reportedly had hoped he could get his infliximab infusion in the ED, and ED physician informed him this was not feasible.  This patient needs his regularly scheduled infliximab infusion.  No additional treatment recommendations from me at this point.  Dr. Fuller Plan out of the office 03/26/2022, returning 03/29/2022.  - H. Loletha Carrow, MD

## 2022-03-25 NOTE — ED Triage Notes (Signed)
Pt reports hx of fistula with c.o rectal pain, back pain and bleeding for the past several days, some nausea.

## 2022-03-25 NOTE — ED Provider Notes (Signed)
[facility] EMERGENCY DEPARTMENT Provider Note   CSN: [id number] Arrival date & time: 03/25/22  6222     History  Chief Complaint  Patient presents with   Rectal Pain   Back Pain   Rectal Bleeding    Shawn [name] is a 36 y.o. male.  HPI 36 year old male with a history of Crohn disease and perianal fistula and previous abscess presents with rectal bleeding and rectal pain.  This has been ongoing for about 3 to 4 days.  He feels general fatigue.  No abdominal pain though some bloating.  He has chronic back and rectal pain for over the last 1 year.  Back in May he had a seton placed.  He had chronic drainage since.  He has not been having his Remicade since March, he usually receives every 2 months or so, but due to insurance issues has been unable to get.  He follows with Dr. Fuller Plan.  Home Medications Prior to Admission medications   Medication Sig Start Date End Date Taking? Authorizing Provider  acetaminophen (TYLENOL) 500 MG tablet Take 2 tablets (1,000 mg total) by mouth every 6 (six) hours. 04/22/21   Nita Sells, MD  amoxicillin-clavulanate (AUGMENTIN) 875-125 MG tablet Take 1 tablet by mouth 2 (two) times daily. 01/16/22   Donnie Mesa, MD  multivitamin-iron-minerals-folic acid (CENTRUM) chewable tablet Chew 1 tablet by mouth daily. 07/10/21   [name], MD  oxyCODONE (OXY IR/ROXICODONE) 5 MG immediate release tablet Take 1 tablet (5 mg total) by mouth every 6 (six) hours as needed for severe pain. 01/16/22   Donnie Mesa, MD  pantoprazole (PROTONIX) 40 MG tablet Take 1 tablet (40 mg total) by mouth daily. Patient taking differently: Take 40 mg by mouth as needed. 08/05/21   Ladene Artist, MD  polyethylene glycol powder (GLYCOLAX/MIRALAX) 17 GM/SCOOP powder Take 17 g by mouth daily. 05/27/21   Charlott Rakes, MD  vitamin B-12 1000 MCG tablet Take 1 tablet (1,000 mcg total) by mouth daily. 07/10/21   [name], MD      Allergies     Shrimp [shellfish allergy] and Nsaids    Review of Systems   Review of Systems  Constitutional:  Negative for fever.  Respiratory:  Negative for shortness of breath.   Cardiovascular:  Negative for chest pain.  Gastrointestinal:  Positive for blood in stool, nausea and rectal pain. Negative for abdominal pain and vomiting.  Musculoskeletal:  Positive for back pain.    Physical Exam Updated Vital Signs BP 136/89   Pulse 70   Temp 98.5 F (36.9 C) (Oral)   Resp 16   Ht 6' 1"  (1.854 m)   Wt 92.1 kg   SpO2 99%   BMI 26.78 kg/m  Physical Exam Vitals and nursing note reviewed. Exam conducted with a chaperone present.  Constitutional:      Appearance: He is well-developed.  HENT:     Head: Normocephalic and atraumatic.  Cardiovascular:     Rate and Rhythm: Normal rate and regular rhythm.     Heart sounds: Normal heart sounds.  Pulmonary:     Effort: Pulmonary effort is normal.     Breath sounds: Normal breath sounds.  Abdominal:     Palpations: Abdomen is soft.     Tenderness: There is no abdominal tenderness.  Genitourinary:    Comments: Patient defers digital rectal exam but does allow me to look at his anus.  The seton is in place.  There is minimal drainage on the gauze  that he has there.  Otherwise no overt abscess. Skin:    General: Skin is warm and dry.  Neurological:     Mental Status: He is alert.     ED Results / Procedures / Treatments   Labs (all labs ordered are listed, but only abnormal results are displayed) Labs Reviewed  COMPREHENSIVE METABOLIC PANEL - Abnormal; Notable for the following components:      Result Value   Chloride 114 (*)    CO2 17 (*)    Glucose, Bld 104 (*)    BUN 21 (*)    Creatinine, Ser 1.52 (*)    Calcium 8.5 (*)    Albumin 3.3 (*)    All other components within normal limits  CBC WITH DIFFERENTIAL/PLATELET - Abnormal; Notable for the following components:   RBC 3.86 (*)    Hemoglobin 12.9 (*)    HCT 37.6 (*)    All  other components within normal limits    EKG None  Radiology CT Abdomen Pelvis W Contrast  Result Date: 03/25/2022 CLINICAL DATA:  Rectal pain and bleeding.  Known fistula. EXAM: CT ABDOMEN AND PELVIS WITH CONTRAST TECHNIQUE: Multidetector CT imaging of the abdomen and pelvis was performed using the standard protocol following bolus administration of intravenous contrast. RADIATION DOSE REDUCTION: This exam was performed according to the departmental dose-optimization program which includes automated exposure control, adjustment of the mA and/or kV according to patient size and/or use of iterative reconstruction technique. CONTRAST:  111m OMNIPAQUE IOHEXOL 300 MG/ML  SOLN COMPARISON:  Jan 14, 2022. FINDINGS: Lower chest: No acute abnormality. Hepatobiliary: No focal liver abnormality is seen. No gallstones, gallbladder wall thickening, or biliary dilatation. Pancreas: Unremarkable. No pancreatic ductal dilatation or surrounding inflammatory changes. Spleen: Normal in size without focal abnormality. Adrenals/Urinary Tract: Adrenal glands appear normal. No renal or ureteral calculi are noted. No hydronephrosis or renal obstruction is noted. Stable bilateral renal cysts are noted. Urinary bladder is unremarkable. Stomach/Bowel: The stomach and appendix are unremarkable. There is no evidence of bowel obstruction. Stool is seen throughout the colon. There is been surgical placement of seton in the rectocutaneous fistula noted posteriorly on prior exam. Moderate wall thickening of the rectum is noted consistent with inflammation. 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 Vascular/Lymphatic: No significant vascular findings are present. No enlarged abdominal or pelvic lymph nodes. Reproductive: Prostate is unremarkable. Other: Small fat containing right inguinal hernia is noted. No ascites is noted. Musculoskeletal: No acute or significant osseous findings. IMPRESSION:  There has been interval surgical placement of seton in the rectocutaneous fistula noted posteriorly on prior exam. There remains moderate wall thickening of the rectum consistent with inflammation. 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 Small fat containing right inguinal hernia is noted. Electronically Signed   By: JMarijo ConceptionM.D.   On: 03/25/2022 12:22    Procedures Procedures    Medications Ordered in ED Medications  HYDROcodone-acetaminophen (NORCO/VICODIN) 5-325 MG per tablet 1 tablet (1 tablet Oral Given 03/25/22 1045)  iohexol (OMNIPAQUE) 300 MG/ML solution 100 mL (100 mLs Intravenous Contrast Given 03/25/22 1202)  HYDROmorphone (DILAUDID) injection 1 mg (1 mg Intravenous Given 03/25/22 1823)  sodium chloride 0.9 % bolus 1,000 mL (0 mLs Intravenous Stopped 03/25/22 1919)    ED Course/ Medical Decision Making/ A&P                           Medical Decision  Making Amount and/or Complexity of Data Reviewed Labs:     Details: Normal WBC and hemoglobin is higher than baseline.  Kidney function similar.  Low bicarbonate goes along with his elevated chlorine.  No anion gap acidosis Radiology: independent interpretation performed.    Details: No abscess  Risk Prescription drug management.   Exam shows no obvious abscess though he will not let me do a rectal exam.  However, given the negative CT I have low suspicion for missed perirectal abscess.  Labs and vitals are otherwise stable.  He was given IV pain control.  I discussed his case with GI on-call, Dr. Loletha Carrow, who at this point recommends no emergent treatment and he will discuss with the patient's gastroenterologist, Dr. Fuller Plan in the morning.  For now no treatment such as immunotherapy or steroids.  Patient is chronically on oxycodone and thus cannot get a prescription from me.  We will have him follow-up closely with GI.  Given return precautions.        Final Clinical Impression(s) /  ED Diagnoses Final diagnoses:  Crohn's disease with fistula, unspecified gastrointestinal tract location [facility])    Rx / DC Orders ED Discharge Orders     None         Sherwood Gambler, MD 03/25/22 2339

## 2022-03-26 ENCOUNTER — Other Ambulatory Visit: Payer: Commercial Managed Care - HMO

## 2022-03-26 DIAGNOSIS — K50113 Crohn's disease of large intestine with fistula: Secondary | ICD-10-CM

## 2022-03-26 NOTE — Telephone Encounter (Signed)
Spoke with Manus Gunning at [employer] [phone]. She states she is going to call Christella Scheu[employer] now and see if she can get auth letter And get him scheduled asap.

## 2022-03-26 NOTE — Telephone Encounter (Signed)
Manus Gunning called back and states that pt has been approved for the remicade for 2 mths only since he is doing poorly. 7/31-10/1. States insurance states he will need to be switched to Avsola or Inflectra to continue tx after auth expires 10/1.

## 2022-03-26 NOTE — Telephone Encounter (Signed)
He needs to resume infliximab infusions as scheduled. Ongoing compliance with his Crohn's treatment is extremely important.

## 2022-03-26 NOTE — Telephone Encounter (Signed)
Called [facility] to check on status of infusion. Their computer system was down, had to leave a message for them to call back.

## 2022-03-26 NOTE — Telephone Encounter (Signed)
Awaiting call back from [facility] regarding pts infusion.

## 2022-03-29 ENCOUNTER — Telehealth: Payer: Self-pay

## 2022-03-29 ENCOUNTER — Telehealth: Payer: Self-pay | Admitting: Pharmacy Technician

## 2022-03-29 NOTE — Telephone Encounter (Signed)
Patient has been notified that [facility] will be reaching out to schedule appointment.

## 2022-03-29 NOTE — Telephone Encounter (Signed)
Patient has an emergency approval x1 dose for [facility].  auth Submission: approved Payer: [place] Medication & CPT/J Code(s) submitted: Avsola (infliximab-axxq) [id number] Route of submission (phone, fax, portal): phone Auth type: Buy/Bill Units/visits requested: 538m Reference number: [id number] from: 03/26/22 to 04/07/22   [facility] is out-of network with [facility]  [facility] had no availability at the moment and insurance has approved one-time dose @ [facility]  Patient will be scheduled asap

## 2022-03-29 NOTE — Telephone Encounter (Signed)
Ladene Artist, MD  Carl Best, RN  See ED note. Apparently he has not received Remicade since March. We need to resume Remicade or a biosimilar

## 2022-03-29 NOTE — Telephone Encounter (Signed)
OK to change to Avsola if that is covered

## 2022-03-30 ENCOUNTER — Ambulatory Visit (INDEPENDENT_AMBULATORY_CARE_PROVIDER_SITE_OTHER): Payer: Commercial Managed Care - HMO

## 2022-03-30 VITALS — BP 142/77 | HR 77 | Temp 98.0°F | Resp 18 | Ht 73.0 in | Wt 198.2 lb

## 2022-03-30 DIAGNOSIS — K50019 Crohn's disease of small intestine with unspecified complications: Secondary | ICD-10-CM | POA: Diagnosis not present

## 2022-03-30 MED ORDER — SODIUM CHLORIDE 0.9 % IV SOLN
5.0000 mg/kg | Freq: Once | INTRAVENOUS | Status: DC
  Filled 2022-03-30: qty 40

## 2022-03-30 MED ORDER — ACETAMINOPHEN 325 MG PO TABS
650.0000 mg | ORAL_TABLET | Freq: Once | ORAL | Status: DC
  Filled 2022-03-30: qty 2

## 2022-03-30 MED ORDER — ACETAMINOPHEN 325 MG PO TABS
650.0000 mg | ORAL_TABLET | Freq: Once | ORAL | Status: AC
  Administered 2022-03-30: 650 mg via ORAL

## 2022-03-30 MED ORDER — DIPHENHYDRAMINE HCL 25 MG PO CAPS
25.0000 mg | ORAL_CAPSULE | Freq: Once | ORAL | Status: DC
  Filled 2022-03-30: qty 1

## 2022-03-30 MED ORDER — DIPHENHYDRAMINE HCL 25 MG PO CAPS
25.0000 mg | ORAL_CAPSULE | Freq: Once | ORAL | Status: AC
  Administered 2022-03-30: 25 mg via ORAL

## 2022-03-30 MED ORDER — METHYLPREDNISOLONE SODIUM SUCC 40 MG IJ SOLR
40.0000 mg | Freq: Once | INTRAMUSCULAR | Status: AC
  Administered 2022-03-30: 40 mg via INTRAVENOUS

## 2022-03-30 MED ORDER — METHYLPREDNISOLONE SODIUM SUCC 40 MG IJ SOLR
40.0000 mg | Freq: Once | INTRAMUSCULAR | Status: DC
  Filled 2022-03-30: qty 1

## 2022-03-30 MED ORDER — SODIUM CHLORIDE 0.9 % IV SOLN
5.0000 mg/kg | Freq: Once | INTRAVENOUS | Status: AC
  Administered 2022-03-30: 400 mg via INTRAVENOUS
  Filled 2022-03-30: qty 40

## 2022-03-30 NOTE — Progress Notes (Signed)
Diagnosis: Crohn's Disease  Provider:  Marshell Garfinkel, MD  Procedure: Infusion  IV Type: Peripheral, IV Location: L Antecubital  Avsola (Infliximab-axxq), Dose: 480m  Infusion Start Time: 14035 Infusion Stop Time: 12481 Post Infusion IV Care: Observation period completed  Discharge: Condition: Good, Destination: Home . AVS provided to patient.   Performed by:  MPaul Dykes RN

## 2022-04-01 LAB — INFLIXIMAB+AB (SERIAL MONITOR)
Anti-Infliximab Antibody: 1358 ng/mL
Infliximab Drug Level: <0.4 ug/mL

## 2022-04-01 LAB — SERIAL MONITORING

## 2022-04-02 NOTE — Telephone Encounter (Signed)
New order form for Avsola was previously faxed to [facility] on 8/10, however d/t pt' recent lab results he will need to be changed to an alternative medication. Refer to result note 8/4.

## 2022-05-14 ENCOUNTER — Encounter: Payer: Self-pay | Admitting: Gastroenterology

## 2022-05-19 ENCOUNTER — Telehealth: Payer: Self-pay | Admitting: Gastroenterology

## 2022-05-19 NOTE — Telephone Encounter (Signed)
Inbound all from [place] with [facility] calling from [phone]. Jinny Blossom states the patient insurance is not allowing them to have the subq stilara but the medication can be ordered from [place] [facility]. Jinny Blossom also states they have approval for iv stillara. Please give a call to further advise.  Thank you .

## 2022-05-19 NOTE — Telephone Encounter (Signed)
Spoke with Jinny Blossom from [place] stating patient's insurance will not cover the [employer], and stated it would need to be sent to [facility] [phone]). He has not had his first infusion yet, however they will contact him soon to be scheduled. Will route to our prior auth team to see if they are able to assist with the patient assistance.

## 2022-05-26 ENCOUNTER — Encounter: Payer: Self-pay | Admitting: Gastroenterology

## 2022-05-26 ENCOUNTER — Other Ambulatory Visit (HOSPITAL_COMMUNITY): Payer: Self-pay

## 2022-05-26 NOTE — Telephone Encounter (Signed)
[employer]ella Scheu[employer] is the insurance that I'm trying to submit the PA to, however, due to non-payment of premium I'm unable to submit.

## 2022-05-26 NOTE — Telephone Encounter (Addendum)
What will be his dose for Stelara? However pt does not have active coverage at this time due to non payment. Does he have new insurance?

## 2022-05-27 NOTE — Telephone Encounter (Signed)
Yes. Patient would need to speak with insurance due to non-payment of premium. As of now it is saying he does not have coverage.

## 2022-05-31 NOTE — Telephone Encounter (Signed)
Patient is aware that he will need to call & follow up with his insurance company (refer to patient mychart encounter). Will continue to follow up with this.

## 2022-05-31 NOTE — Telephone Encounter (Signed)
Correct, please let me know if patient calls with update on active insurance.

## 2022-06-02 ENCOUNTER — Encounter: Payer: Self-pay | Admitting: Gastroenterology

## 2022-06-03 ENCOUNTER — Other Ambulatory Visit: Payer: Self-pay | Admitting: Nephrology

## 2022-06-03 DIAGNOSIS — N1831 Chronic kidney disease, stage 3a: Secondary | ICD-10-CM

## 2022-06-04 ENCOUNTER — Other Ambulatory Visit: Payer: Commercial Managed Care - HMO

## 2022-06-07 NOTE — Telephone Encounter (Signed)
Per patient mychart encounter: "I have coverage. I paid it last Saturday, they said it took three days to update through the system when I talked to them on Monday. I got my pain meds on Tuesday through my insurance."

## 2022-06-09 NOTE — Telephone Encounter (Signed)
Inbound call from [place] at [place] with a call back number at [phone] ext [phone] in regards to patient taking stillera. Please give a call to further advise.  Thank you

## 2022-06-11 ENCOUNTER — Other Ambulatory Visit (HOSPITAL_COMMUNITY): Payer: Self-pay

## 2022-06-11 ENCOUNTER — Telehealth: Payer: Self-pay | Admitting: Pharmacy Technician

## 2022-06-11 NOTE — Telephone Encounter (Signed)
PA has been approved

## 2022-06-11 NOTE — Telephone Encounter (Signed)
Patient Advocate Encounter  Prior Authorization for [facility] has been approved.    PA# [id number] Effective dates: 10.20.23 through 10.19.24  Fateh Kindle B. CPhT P: [phone] F: [phone]   Received notification that prior authorization for [facility] 90MG is required.   PA submitted on 10.20.23 Key [id number] Status is pending    Luciano Cutter, CPhT Patient Advocate Phone: [phone]

## 2022-06-14 ENCOUNTER — Ambulatory Visit
Admission: RE | Admit: 2022-06-14 | Discharge: 2022-06-14 | Disposition: A | Discharge status: Home / Self Care | Payer: Commercial Managed Care - HMO | Source: Ambulatory Visit | Attending: Nephrology | Admitting: Nephrology

## 2022-06-14 DIAGNOSIS — N1831 Chronic kidney disease, stage 3a: Secondary | ICD-10-CM

## 2022-06-14 NOTE — Telephone Encounter (Signed)
Patient has been notified via mychart that PA was approved & SQ doses will be set up post his infusion on 07/21/22.

## 2022-06-22 NOTE — Telephone Encounter (Signed)
Left message for Shawn [name] to return my call at direct line.

## 2022-06-22 NOTE — Telephone Encounter (Signed)
Antoinette called from [facility] to follow up on [facility] referral and a certain medication for this patient she is seeing on file changed please call [phone] ext [phone]

## 2022-06-23 NOTE — Telephone Encounter (Signed)
Unable to reach Antoinette at number provided.

## 2022-06-28 NOTE — Telephone Encounter (Signed)
Unable to reach Shawn [name] x 3.

## 2022-06-30 NOTE — Telephone Encounter (Signed)
Spoke with Antoinette with [facility] in regards to patient's current regimen. All questions answered.

## 2022-07-19 ENCOUNTER — Other Ambulatory Visit (HOSPITAL_COMMUNITY): Payer: Self-pay

## 2022-07-19 ENCOUNTER — Other Ambulatory Visit: Payer: Self-pay

## 2022-07-19 MED ORDER — STELARA 90 MG/ML ~~LOC~~ SOSY
90.0000 mg | PREFILLED_SYRINGE | SUBCUTANEOUS | 5 refills | Status: DC
  Filled 2022-07-19 – 2022-07-26 (×2): qty 1, 56d supply, fill #0
  Filled 2022-11-12: qty 1, 56d supply, fill #1

## 2022-07-20 ENCOUNTER — Other Ambulatory Visit (HOSPITAL_COMMUNITY): Payer: Self-pay

## 2022-07-22 ENCOUNTER — Telehealth: Payer: Self-pay | Admitting: Pharmacy Technician

## 2022-07-22 ENCOUNTER — Other Ambulatory Visit (HOSPITAL_COMMUNITY): Payer: Self-pay

## 2022-07-22 NOTE — Telephone Encounter (Addendum)
Delivery instructions have been updated in [facility], medication will be couriered to patient's home address by 12.6.23.  Rx has been processed in [facility] and the patient has no copay at this time.

## 2022-07-26 ENCOUNTER — Other Ambulatory Visit (HOSPITAL_COMMUNITY): Payer: Self-pay

## 2022-07-26 ENCOUNTER — Telehealth: Payer: Self-pay

## 2022-07-26 NOTE — Telephone Encounter (Signed)
Spoke with pt regarding MD recommendations. Pt verbalized all understanding.

## 2022-07-26 NOTE — Telephone Encounter (Signed)
Patient called in stating he had not heard from the pharmacy regarding his Stelera injections. Called and spoke with [facility] they have not been able to reach patient. Pt made aware & provided pharmacy number. He is to contact them to discuss onboarding process.

## 2022-07-26 NOTE — Telephone Encounter (Signed)
Hold off on Stelara injection until flu symptoms resolve

## 2022-07-26 NOTE — Telephone Encounter (Signed)
Received secure chat from [place] with Pharmacy Team: "pt was diagnosed with the flu on yesterday and would like a call back to discuss if he should take the [place] when he receives it or should he hold. shipment is set up for Wednesday delivery." Confirmed this with patient that he was diagnosed yesterday. No current fever. Will route to MD.

## 2022-07-27 ENCOUNTER — Other Ambulatory Visit (HOSPITAL_COMMUNITY): Payer: Self-pay

## 2022-08-03 ENCOUNTER — Other Ambulatory Visit (HOSPITAL_COMMUNITY): Payer: Self-pay

## 2022-08-13 ENCOUNTER — Encounter: Payer: Self-pay | Admitting: Gastroenterology

## 2022-09-10 ENCOUNTER — Telehealth: Payer: Self-pay | Admitting: Gastroenterology

## 2022-09-10 NOTE — Telephone Encounter (Signed)
Vicky the Nurse Navigator for patients on Stelara is calling wishing to speak with Donita Brooks regarding this patients injections. Please advise [phone]

## 2022-09-10 NOTE — Telephone Encounter (Signed)
Spoke with Olegario Shearer, patient's nurse navigator with [facility], and she states that patient is due for his next injection on 09/22/22 and no longer has [employer]. He will have [employer] starting on 09/23/22. Confirmed with patient. Will reach out to [facility] rep to see what options patient may have as far as coverage for next dose.

## 2022-09-13 NOTE — Telephone Encounter (Signed)
Spoke with Caryl Pina from [place]. She is going to give me a call back about potentially setting patient up with a sample dose & bringing it in on Friday. My direct number provided.

## 2022-09-13 NOTE — Telephone Encounter (Signed)
Left message with [facility] drug rep Benjie Karvonen.

## 2022-09-15 ENCOUNTER — Other Ambulatory Visit (HOSPITAL_COMMUNITY): Payer: Self-pay

## 2022-09-15 NOTE — Telephone Encounter (Signed)
The pt has been advised of the update on the stelara.  He will await call from our office.

## 2022-09-15 NOTE — Telephone Encounter (Signed)
Shawn [name] with [facility] came to the office to say that the Stelara will arrive either on Friday of this week or Tuesday next week.  The pt will have stelara as needed after this shipment.

## 2022-09-16 ENCOUNTER — Other Ambulatory Visit (HOSPITAL_COMMUNITY): Payer: Self-pay

## 2022-09-17 ENCOUNTER — Telehealth: Payer: Self-pay

## 2022-09-17 NOTE — Telephone Encounter (Signed)
-----  Message from Timothy Lasso, RN sent at 09/15/2022 12:34 PM EST ----- [facility] to arrive at the office Friday or Tuesday

## 2022-09-17 NOTE — Telephone Encounter (Signed)
The pt came in and picked up the stelara sample from our office.

## 2022-09-17 NOTE — Telephone Encounter (Signed)
Stelara has been received. I have called and left a message for the pt to pick  up.

## 2022-10-19 ENCOUNTER — Encounter: Payer: Self-pay | Admitting: Gastroenterology

## 2022-11-08 ENCOUNTER — Encounter: Payer: Self-pay | Admitting: Gastroenterology

## 2022-11-12 ENCOUNTER — Other Ambulatory Visit (HOSPITAL_COMMUNITY): Payer: Self-pay

## 2022-11-12 ENCOUNTER — Encounter: Payer: Self-pay | Admitting: Gastroenterology

## 2022-11-12 ENCOUNTER — Other Ambulatory Visit: Payer: Self-pay

## 2022-11-15 ENCOUNTER — Other Ambulatory Visit (HOSPITAL_COMMUNITY): Payer: Self-pay

## 2022-11-15 ENCOUNTER — Other Ambulatory Visit: Payer: Self-pay

## 2022-11-16 ENCOUNTER — Other Ambulatory Visit (HOSPITAL_COMMUNITY): Payer: Self-pay

## 2022-11-17 ENCOUNTER — Encounter: Payer: Self-pay | Admitting: Gastroenterology

## 2022-11-17 ENCOUNTER — Telehealth: Payer: Self-pay | Admitting: Pharmacy Technician

## 2022-11-17 ENCOUNTER — Other Ambulatory Visit (HOSPITAL_COMMUNITY): Payer: Self-pay

## 2022-11-17 NOTE — Telephone Encounter (Signed)
Received notification from [facility] regarding a prior authorization for [facility]. Authorization has been APPROVED from 11/17/22 to 11/17/23.   Authorization # (Key: [id number]

## 2022-11-17 NOTE — Telephone Encounter (Signed)
Pharmacy notified patient has insurance change to [facility] from [place].  Submitted a Prior Authorization request to [facility] for [facility] via CoverMyMeds. Will update once we receive a response.  Key: [id number] - PA Case ID: [id number]

## 2022-11-18 ENCOUNTER — Other Ambulatory Visit: Payer: Self-pay

## 2022-11-18 ENCOUNTER — Other Ambulatory Visit (HOSPITAL_COMMUNITY): Payer: Self-pay

## 2022-11-18 MED ORDER — STELARA 90 MG/ML ~~LOC~~ SOSY
90.0000 mg | PREFILLED_SYRINGE | SUBCUTANEOUS | 0 refills | Status: DC
  Filled 2022-11-18 – 2023-01-17 (×2): qty 1, 56d supply, fill #0

## 2022-12-23 ENCOUNTER — Other Ambulatory Visit (HOSPITAL_COMMUNITY): Payer: Self-pay

## 2022-12-23 ENCOUNTER — Encounter: Payer: Self-pay | Admitting: Gastroenterology

## 2022-12-27 ENCOUNTER — Other Ambulatory Visit (HOSPITAL_COMMUNITY): Payer: Self-pay

## 2022-12-29 ENCOUNTER — Encounter: Payer: Self-pay | Admitting: Gastroenterology

## 2022-12-29 ENCOUNTER — Other Ambulatory Visit: Payer: Self-pay | Admitting: Nurse Practitioner

## 2022-12-29 ENCOUNTER — Ambulatory Visit
Admission: RE | Admit: 2022-12-29 | Discharge: 2022-12-29 | Disposition: A | Discharge status: Home / Self Care | Payer: Medicaid Other | Source: Ambulatory Visit | Attending: Nurse Practitioner | Admitting: Nurse Practitioner

## 2022-12-29 DIAGNOSIS — M25512 Pain in left shoulder: Secondary | ICD-10-CM | POA: Diagnosis not present

## 2022-12-29 DIAGNOSIS — M533 Sacrococcygeal disorders, not elsewhere classified: Secondary | ICD-10-CM | POA: Diagnosis not present

## 2022-12-29 DIAGNOSIS — Z8739 Personal history of other diseases of the musculoskeletal system and connective tissue: Secondary | ICD-10-CM | POA: Diagnosis not present

## 2022-12-29 DIAGNOSIS — Z79891 Long term (current) use of opiate analgesic: Secondary | ICD-10-CM | POA: Diagnosis not present

## 2022-12-29 DIAGNOSIS — M545 Low back pain, unspecified: Secondary | ICD-10-CM

## 2022-12-29 DIAGNOSIS — G894 Chronic pain syndrome: Secondary | ICD-10-CM | POA: Diagnosis not present

## 2022-12-29 DIAGNOSIS — K5090 Crohn's disease, unspecified, without complications: Secondary | ICD-10-CM | POA: Diagnosis not present

## 2022-12-29 DIAGNOSIS — M546 Pain in thoracic spine: Secondary | ICD-10-CM | POA: Diagnosis not present

## 2022-12-29 DIAGNOSIS — M791 Myalgia, unspecified site: Secondary | ICD-10-CM | POA: Diagnosis not present

## 2023-01-03 ENCOUNTER — Encounter: Payer: Self-pay | Admitting: Gastroenterology

## 2023-01-05 ENCOUNTER — Ambulatory Visit: Payer: 59 | Attending: Family Medicine | Admitting: Family Medicine

## 2023-01-05 ENCOUNTER — Encounter: Payer: Self-pay | Admitting: Family Medicine

## 2023-01-05 VITALS — BP 127/74 | HR 80 | Ht 73.0 in | Wt 203.0 lb

## 2023-01-05 DIAGNOSIS — Z13228 Encounter for screening for other metabolic disorders: Secondary | ICD-10-CM

## 2023-01-05 DIAGNOSIS — Z131 Encounter for screening for diabetes mellitus: Secondary | ICD-10-CM

## 2023-01-05 DIAGNOSIS — G8929 Other chronic pain: Secondary | ICD-10-CM

## 2023-01-05 DIAGNOSIS — K50019 Crohn's disease of small intestine with unspecified complications: Secondary | ICD-10-CM

## 2023-01-05 DIAGNOSIS — Z720 Tobacco use: Secondary | ICD-10-CM

## 2023-01-05 DIAGNOSIS — N289 Disorder of kidney and ureter, unspecified: Secondary | ICD-10-CM

## 2023-01-05 NOTE — Progress Notes (Signed)
Subjective:  Patient ID: Shawn [name], male    DOB: [birth year]  Age: 37 y.o. MRN: [id number]  CC: Back Pain   HPI Shawn [name] is a 37 y.o. year old male with a history of hypertension, dilated cardiomyopathy (diagnosed in 2014, last echo in 11/2020 showed a LVEF of 65-70% with normal left ventricle function and no other structural abnormalities),alcohol abuse, disease with perirectal abscess (status post EUA and drainage of left posterior perirectal abscess), Crohn's disease, osteomyelitis of the Coccyx.   Interval History:  His back still hurts and he started back seeing Pain management again and is Robaxin; he goes back in 2 weeks to see them. He had to change Pain management Clinics due to his insurance; previously on oxycodone for pain. His anxiety scores are high and he attributes it to not being able to move around due to pain. No other medications work for him besides oxycodone. He smokes but is not ready to quit. Crohn's disease is managed by GI and he remains on Stelara. 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   PO B12 initiated 12/31/20   Crohn's colitis (HCC)    Dilated cardiomyopathy (HCC) 05/18/2013   By catheterization 2014    Fistula    Hypertension     Past Surgical History:  Procedure Laterality Date   BIOPSY  01/01/2021   Procedure: BIOPSY;  Surgeon: Meryl Dare, MD;  Location: [facility] ENDOSCOPY;  Service: Endoscopy;;   COLONOSCOPY WITH PROPOFOL N/A 01/01/2021   Procedure: COLONOSCOPY WITH PROPOFOL;  Surgeon: Meryl Dare, MD;  Location: [facility] ENDOSCOPY;  Service: Endoscopy;  Laterality: N/A;   INCISION AND DRAINAGE ABSCESS N/A 07/07/2021   Procedure: MODIFIED HANLEY PROCEDURE, DRAINAGE WITH SETON PLACEMENT;  Surgeon: Karie Soda, MD;  Location: [facility] ORS;  Service: General;  Laterality: N/A;   LEFT AND RIGHT HEART CATHETERIZATION WITH CORONARY ANGIOGRAM N/A 11/21/2012   Procedure: LEFT  AND RIGHT HEART CATHETERIZATION WITH CORONARY ANGIOGRAM;  Surgeon: Ricki Rodriguez, MD;  Location: [facility] CATH LAB;  Service: Cardiovascular;  Laterality: N/A;   MANDIBLE FRACTURE SURGERY  2006   PLACEMENT OF SETON N/A 07/07/2021   Procedure: PLACEMENT OF SETON;  Surgeon: Karie Soda, MD;  Location: [facility] ORS;  Service: General;  Laterality: N/A;   PLACEMENT OF SETON N/A 01/15/2022   Procedure: PLACEMENT OF SETON;  Surgeon: Karie Soda, MD;  Location: [facility] ORS;  Service: General;  Laterality: N/A;   RECTAL EXAM UNDER ANESTHESIA N/A 01/15/2022   Procedure: RECTAL EXAM UNDER ANESTHESIA;  Surgeon: Karie Soda, MD;  Location: [facility] ORS;  Service: General;  Laterality: N/A;  TRANSRECTAL DRAINAGE REPLACEMENT OF SETON EXCISION OF PERINEAL SINUS TRACT ANORECTAL EXAM UNDER ANESTHESIA    Family History  Problem Relation Age of Onset   Diabetes Mother    Diabetes Sister    Colon cancer Neg Hx    Stomach cancer Neg Hx    Esophageal cancer Neg Hx    Pancreatic disease Neg Hx     Social History   Socioeconomic History   Marital status: Single    Spouse name: Not on file   Number of children: Not on file   Years of education: Not on file   Highest education level: Associate degree: occupational, Scientist, product/process development, or vocational program  Occupational History   Not on file  Tobacco Use   Smoking status: Every Day    Packs/day: .25    Types: Cigarettes   Smokeless  tobacco: Never  Vaping Use   Vaping Use: Never used  Substance and Sexual Activity   Alcohol use: Yes    Comment: occ   Drug use: No   Sexual activity: Not on file  Other Topics Concern   Not on file  Social History Narrative   Not on file   Social Determinants of Health   Financial Resource Strain: High Risk (01/03/2023)   Overall Financial Resource Strain (CARDIA)    Difficulty of Paying Living Expenses: Hard  Food Insecurity: Food Insecurity Present (01/03/2023)   Hunger Vital Sign    Worried About Running Out of Food in the Last  Year: Sometimes true    Ran Out of Food in the Last Year: Sometimes true  Transportation Needs: No Transportation Needs (01/03/2023)   PRAPARE - Administrator, Civil Service (Medical): No    Lack of Transportation (Non-Medical): No  Physical Activity: Sufficiently Active (01/03/2023)   Exercise Vital Sign    Days of Exercise per Week: 7 days    Minutes of Exercise per Session: 90 min  Stress: Stress Concern Present (01/03/2023)   [employer] of Occupational Health - Occupational Stress Questionnaire    Feeling of Stress : Very much  Social Connections: Socially Isolated (01/03/2023)   Social Connection and Isolation Panel [NHANES]    Frequency of Communication with Friends and Family: Three times a week    Frequency of Social Gatherings with Friends and Family: Never    Attends Religious Services: Never    Database administrator or Organizations: No    Attends Engineer, structural: Not on file    Marital Status: Never married    Allergies  Allergen Reactions   Shrimp [Shellfish Allergy] Shortness Of Breath   Nsaids Other (See Comments)    CROHN'S DISEASE = NO NSAIDS    Outpatient Medications Prior to Visit  Medication Sig Dispense Refill   oxyCODONE (OXY IR/ROXICODONE) 5 MG immediate release tablet Take 1 tablet (5 mg total) by mouth every 6 (six) hours as needed for severe pain. 20 tablet 0   acetaminophen (TYLENOL) 500 MG tablet Take 2 tablets (1,000 mg total) by mouth every 6 (six) hours. (Patient not taking: Reported on 01/05/2023) 30 tablet 0   multivitamin-iron-minerals-folic acid (CENTRUM) chewable tablet Chew 1 tablet by mouth daily. (Patient not taking: Reported on 01/05/2023) 30 tablet 2   pantoprazole (PROTONIX) 40 MG tablet Take 1 tablet (40 mg total) by mouth daily. (Patient not taking: Reported on 01/05/2023) 30 tablet 11   polyethylene glycol powder (GLYCOLAX/MIRALAX) 17 GM/SCOOP powder Take 17 g by mouth daily. (Patient not taking: Reported on  01/05/2023) 3350 g 1   ustekinumab (STELARA) 90 MG/ML SOSY injection Inject 1 mL (90 mg total) into the skin every 8 (eight) weeks. Schedule an appointment (Patient not taking: Reported on 01/05/2023) 1 mL 0   vitamin B-12 1000 MCG tablet Take 1 tablet (1,000 mcg total) by mouth daily. (Patient not taking: Reported on 01/05/2023) 30 tablet 2   amoxicillin-clavulanate (AUGMENTIN) 875-125 MG tablet Take 1 tablet by mouth 2 (two) times daily. (Patient not taking: Reported on 01/05/2023) 14 tablet 3   Facility-Administered Medications Prior to Visit  Medication Dose Route Frequency Provider Last Rate Last Admin   bupivacaine liposome (EXPAREL) 1.3 % injection 266 mg  20 mL Infiltration Once Karie Soda, MD       Chlorhexidine Gluconate Cloth 2 % PADS 6 each  6 each Topical Once Karie Soda, MD  And   Chlorhexidine Gluconate Cloth 2 % PADS 6 each  6 each Topical Once Karie Soda, MD       feeding supplement (ENSURE PRE-SURGERY) liquid 296 mL  296 mL Oral Once Karie Soda, MD         ROS Review of Systems  Constitutional:  Negative for activity change and appetite change.  HENT:  Negative for sinus pressure and sore throat.   Respiratory:  Negative for chest tightness, shortness of breath and wheezing.   Cardiovascular:  Negative for chest pain and palpitations.  Gastrointestinal:  Negative for abdominal distention, abdominal pain and constipation.  Genitourinary: Negative.   Musculoskeletal:  Positive for back pain.  Psychiatric/Behavioral:  Negative for behavioral problems and dysphoric mood.     Objective:  BP 127/74   Pulse 80   Ht 6\' 1"  (1.854 m)   Wt 203 lb (92.1 kg)   SpO2 98%   BMI 26.78 kg/m      01/05/2023    3:54 PM 03/30/2022    1:11 PM 03/30/2022   12:25 PM  BP/Weight  Systolic BP 127 142 131  Diastolic BP 74 77 83  Wt. (Lbs) 203    BMI 26.78 kg/m2        Physical Exam Constitutional:      Appearance: He is well-developed.  Cardiovascular:     Rate and  Rhythm: Normal rate.     Heart sounds: Normal heart sounds. No murmur heard. Pulmonary:     Effort: Pulmonary effort is normal.     Breath sounds: Normal breath sounds. No wheezing or rales.  Chest:     Chest wall: No tenderness.  Abdominal:     General: Bowel sounds are normal. There is no distension.     Palpations: Abdomen is soft. There is no mass.     Tenderness: There is no abdominal tenderness.  Musculoskeletal:        General: Tenderness (Lumbar spine) present. Normal range of motion.     Right lower leg: No edema.     Left lower leg: No edema.  Neurological:     Mental Status: He is alert and oriented to person, place, and time.  Psychiatric:        Mood and Affect: Mood normal.        Latest Ref Rng & Units 03/25/2022   10:49 AM 01/16/2022    4:05 AM 01/14/2022    4:15 PM  CMP  Glucose 70 - 99 mg/dL 782  956  93   BUN 6 - 20 mg/dL 21  25  21    Creatinine 0.61 - 1.24 mg/dL 2.13  0.86  5.78   Sodium 135 - 145 mmol/L 138  136  139   Potassium 3.5 - 5.1 mmol/L 4.5  4.8  5.0   Chloride 98 - 111 mmol/L 114  109  112   CO2 22 - 32 mmol/L 17  20  22    Calcium 8.9 - 10.3 mg/dL 8.5  8.6  8.6   Total Protein 6.5 - 8.1 g/dL 7.7  6.9  8.0   Total Bilirubin 0.3 - 1.2 mg/dL 0.7  0.6  0.5   Alkaline Phos 38 - 126 U/L 121  100  112   AST 15 - 41 U/L 23  19  17    ALT 0 - 44 U/L 27  23  22      Lipid Panel     Component Value Date/Time   CHOL 119 11/18/2020 1117   TRIG  111 11/18/2020 1117   HDL 33 (L) 11/18/2020 1117   CHOLHDL 3.6 11/18/2020 1117   CHOLHDL 3.1 11/20/2012 0439   VLDL 6 11/20/2012 0439   LDLCALC 65 11/18/2020 1117    CBC    Component Value Date/Time   WBC 7.7 03/25/2022 1049   RBC 3.86 (L) 03/25/2022 1049   HGB 12.9 (L) 03/25/2022 1049   HCT 37.6 (L) 03/25/2022 1049   PLT 294 03/25/2022 1049   MCV 97.4 03/25/2022 1049   MCH 33.4 03/25/2022 1049   MCHC 34.3 03/25/2022 1049   RDW 13.7 03/25/2022 1049   LYMPHSABS 2.0 03/25/2022 1049   MONOABS 0.4  03/25/2022 1049   EOSABS 0.5 03/25/2022 1049   BASOSABS 0.1 03/25/2022 1049    Lab Results  Component Value Date   HGBA1C 5.6 12/31/2020       01/05/2023    3:56 PM 05/27/2021   10:45 AM 01/05/2021    9:30 AM 11/18/2020   10:26 AM  GAD 7 : Generalized Anxiety Score  Nervous, Anxious, on Edge 3 3 3 3   Control/stop worrying 3 3 2 2   Worry too much - different things 3 3 2 2   Trouble relaxing 3 3 2 2   Restless 3 3 2  0  Easily annoyed or irritable 3 3 2 1   Afraid - awful might happen 3 3 2 2   Total GAD 7 Score 21 21 15 12         01/05/2023    3:56 PM 08/05/2021    3:21 PM 07/22/2021   10:33 AM 05/27/2021   10:45 AM 02/19/2021    2:40 PM  Depression screen PHQ 2/9  Decreased Interest 1 0 0 3 0  Down, Depressed, Hopeless 1 1 1 3  0  PHQ - 2 Score 2 1 1 6  0  Altered sleeping 1   3   Tired, decreased energy 1   3   Change in appetite 1   3   Feeling bad or failure about yourself  1   3   Trouble concentrating 1   3   Moving slowly or fidgety/restless 3   3   Suicidal thoughts 0   0   PHQ-9 Score 10   24      Assessment & Plan:  1. Crohn's disease of small intestine with complication (HCC) Stable Continue with Stelara Management per GI - CBC with Differential/Platelet; Future  2. Other chronic pain Chronic low back pain which is uncontrolled This explains his elevated GAD-7 scores and he declines initiation of medication History of sacral osteomyelitis Previously on oxycodone He is now established with the pain clinic and will be seeing them in 2 weeks I have offered to place him on Cymbalta but he declines as he states it was previously ineffective Advised to use topical regimens like Lidoderm patches, Voltaren gel, IcyHot  3. Screening for diabetes mellitus - Hemoglobin A1c; Future  4. Screening for metabolic disorder - LP+Non-HDL Cholesterol; Future - CMP14+EGFR; Future  5. Abnormal kidney function Last renal function from 03/2022 revealed slightly abnormal  creatinine He was actually seen by [facility] but was unable to follow-up due to insurance issues Will recheck again - Cystatin C; Future  6.  Nicotine dependence Counseled on smoking cessation but he is not ready to quit  No orders of the defined types were placed in this encounter.   Follow-up: Return in about 1 year (around 01/05/2024) for CPE/ Preventive Health Exam.       Odette Horns  Alvis Lemmings, MD, FAAFP. [facility] [place], [place] [phone]   01/05/2023, 4:31 PM

## 2023-01-05 NOTE — Patient Instructions (Signed)

## 2023-01-07 ENCOUNTER — Other Ambulatory Visit (HOSPITAL_COMMUNITY): Payer: Self-pay

## 2023-01-10 ENCOUNTER — Ambulatory Visit: Payer: 59 | Attending: Family Medicine

## 2023-01-10 DIAGNOSIS — Z13228 Encounter for screening for other metabolic disorders: Secondary | ICD-10-CM

## 2023-01-10 DIAGNOSIS — K50019 Crohn's disease of small intestine with unspecified complications: Secondary | ICD-10-CM | POA: Diagnosis not present

## 2023-01-10 DIAGNOSIS — N289 Disorder of kidney and ureter, unspecified: Secondary | ICD-10-CM | POA: Diagnosis not present

## 2023-01-10 DIAGNOSIS — Z131 Encounter for screening for diabetes mellitus: Secondary | ICD-10-CM

## 2023-01-11 LAB — CBC WITH DIFFERENTIAL/PLATELET
Basophils Absolute: 0.1 10*3/uL (ref 0.0–0.2)
Basos: 1 %
EOS (ABSOLUTE): 0.5 10*3/uL — ABNORMAL HIGH (ref 0.0–0.4)
Eos: 5 %
Hematocrit: 39.5 % (ref 37.5–51.0)
Hemoglobin: 13.3 g/dL (ref 13.0–17.7)
Immature Grans (Abs): 0 10*3/uL (ref 0.0–0.1)
Immature Granulocytes: 0 %
Lymphocytes Absolute: 4 10*3/uL — ABNORMAL HIGH (ref 0.7–3.1)
Lymphs: 40 %
MCH: 35.4 pg — ABNORMAL HIGH (ref 26.6–33.0)
MCHC: 33.7 g/dL (ref 31.5–35.7)
MCV: 105 fL — ABNORMAL HIGH (ref 79–97)
Monocytes Absolute: 0.4 10*3/uL (ref 0.1–0.9)
Monocytes: 4 %
Neutrophils Absolute: 5 10*3/uL (ref 1.4–7.0)
Neutrophils: 50 %
Platelets: 285 10*3/uL (ref 150–450)
RBC: 3.76 x10E6/uL — ABNORMAL LOW (ref 4.14–5.80)
RDW: 16.9 % — ABNORMAL HIGH (ref 11.6–15.4)
WBC: 10.1 10*3/uL (ref 3.4–10.8)

## 2023-01-11 LAB — CMP14+EGFR
ALT: 29 IU/L (ref 0–44)
AST: 20 IU/L (ref 0–40)
Albumin/Globulin Ratio: 1.3 (ref 1.2–2.2)
Albumin: 3.9 g/dL — ABNORMAL LOW (ref 4.1–5.1)
Alkaline Phosphatase: 141 IU/L — ABNORMAL HIGH (ref 44–121)
BUN/Creatinine Ratio: 12 (ref 9–20)
BUN: 16 mg/dL (ref 6–20)
Bilirubin Total: 0.6 mg/dL (ref 0.0–1.2)
CO2: 14 mmol/L — ABNORMAL LOW (ref 20–29)
Calcium: 8.5 mg/dL — ABNORMAL LOW (ref 8.7–10.2)
Chloride: 110 mmol/L — ABNORMAL HIGH (ref 96–106)
Creatinine, Ser: 1.35 mg/dL — ABNORMAL HIGH (ref 0.76–1.27)
Globulin, Total: 3 g/dL (ref 1.5–4.5)
Glucose: 91 mg/dL (ref 70–99)
Potassium: 4.6 mmol/L (ref 3.5–5.2)
Sodium: 140 mmol/L (ref 134–144)
Total Protein: 6.9 g/dL (ref 6.0–8.5)
eGFR: 69 mL/min/{1.73_m2} (ref >59)

## 2023-01-11 LAB — LP+NON-HDL CHOLESTEROL
Cholesterol, Total: 111 mg/dL (ref 100–199)
HDL: 39 mg/dL — ABNORMAL LOW (ref >39)
LDL Chol Calc (NIH): 50 mg/dL (ref 0–99)
Total Non-HDL-Chol (LDL+VLDL): 72 mg/dL (ref 0–129)
Triglycerides: 119 mg/dL (ref 0–149)
VLDL Cholesterol Cal: 22 mg/dL (ref 5–40)

## 2023-01-11 LAB — CYSTATIN C: CYSTATIN C: 1.12 mg/L — ABNORMAL HIGH (ref 0.60–1.00)

## 2023-01-11 LAB — HEMOGLOBIN A1C
Est. average glucose Bld gHb Est-mCnc: 108 mg/dL
Hgb A1c MFr Bld: 5.4 % (ref 4.8–5.6)

## 2023-01-17 ENCOUNTER — Other Ambulatory Visit: Payer: Self-pay

## 2023-01-18 ENCOUNTER — Other Ambulatory Visit (HOSPITAL_COMMUNITY): Payer: Self-pay

## 2023-01-18 ENCOUNTER — Telehealth: Payer: Self-pay | Admitting: Gastroenterology

## 2023-01-18 ENCOUNTER — Other Ambulatory Visit: Payer: Self-pay

## 2023-01-18 ENCOUNTER — Ambulatory Visit: Payer: Commercial Managed Care - HMO | Admitting: Family Medicine

## 2023-01-18 NOTE — Telephone Encounter (Signed)
error 

## 2023-01-19 ENCOUNTER — Other Ambulatory Visit: Payer: Self-pay

## 2023-01-19 DIAGNOSIS — K5090 Crohn's disease, unspecified, without complications: Secondary | ICD-10-CM | POA: Diagnosis not present

## 2023-01-19 DIAGNOSIS — Z79891 Long term (current) use of opiate analgesic: Secondary | ICD-10-CM | POA: Diagnosis not present

## 2023-01-19 DIAGNOSIS — G894 Chronic pain syndrome: Secondary | ICD-10-CM | POA: Diagnosis not present

## 2023-01-19 DIAGNOSIS — M545 Low back pain, unspecified: Secondary | ICD-10-CM | POA: Diagnosis not present

## 2023-01-19 DIAGNOSIS — M546 Pain in thoracic spine: Secondary | ICD-10-CM | POA: Diagnosis not present

## 2023-01-19 DIAGNOSIS — M533 Sacrococcygeal disorders, not elsewhere classified: Secondary | ICD-10-CM | POA: Diagnosis not present

## 2023-01-19 DIAGNOSIS — Z8739 Personal history of other diseases of the musculoskeletal system and connective tissue: Secondary | ICD-10-CM | POA: Diagnosis not present

## 2023-01-19 DIAGNOSIS — M25512 Pain in left shoulder: Secondary | ICD-10-CM | POA: Diagnosis not present

## 2023-01-19 MED ORDER — STELARA 90 MG/ML ~~LOC~~ SOSY: 90.0000 mg | PREFILLED_SYRINGE | SUBCUTANEOUS | 1 refills | Status: DC

## 2023-01-22 ENCOUNTER — Other Ambulatory Visit: Payer: Self-pay

## 2023-01-22 ENCOUNTER — Encounter (HOSPITAL_COMMUNITY): Payer: Self-pay | Admitting: Emergency Medicine

## 2023-01-22 ENCOUNTER — Emergency Department (HOSPITAL_COMMUNITY)
Admission: EM | Admit: 2023-01-22 | Discharge: 2023-01-23 | Disposition: A | Discharge status: Home / Self Care | Payer: 59 | Attending: Emergency Medicine | Admitting: Emergency Medicine

## 2023-01-22 DIAGNOSIS — D72829 Elevated white blood cell count, unspecified: Secondary | ICD-10-CM | POA: Insufficient documentation

## 2023-01-22 DIAGNOSIS — K605 Anorectal fistula: Secondary | ICD-10-CM | POA: Diagnosis not present

## 2023-01-22 DIAGNOSIS — K610 Anal abscess: Secondary | ICD-10-CM | POA: Diagnosis not present

## 2023-01-22 DIAGNOSIS — N289 Disorder of kidney and ureter, unspecified: Secondary | ICD-10-CM

## 2023-01-22 DIAGNOSIS — M533 Sacrococcygeal disorders, not elsewhere classified: Secondary | ICD-10-CM | POA: Diagnosis not present

## 2023-01-22 DIAGNOSIS — K603 Anal fistula: Secondary | ICD-10-CM | POA: Diagnosis not present

## 2023-01-22 NOTE — ED Triage Notes (Addendum)
Pt in with tailbone pain that is spreading up his back. Hx of chronic back pain, pilonidal cysts also hx of colorectal fistulas and osteomyelitis in 20', requiring surgery. Pt denies any fevers or trouble walking or urinating. States he saw a spine specialist this past week, had lumbar xrays and they couldn't find anything acute, and was it probably nerve damage. Thinks he may have a new gluteal fold abscess too

## 2023-01-23 ENCOUNTER — Emergency Department (HOSPITAL_COMMUNITY): Payer: 59

## 2023-01-23 DIAGNOSIS — K605 Anorectal fistula: Secondary | ICD-10-CM | POA: Diagnosis not present

## 2023-01-23 LAB — BASIC METABOLIC PANEL
Anion gap: 7 (ref 5–15)
BUN: 22 mg/dL — ABNORMAL HIGH (ref 6–20)
CO2: 19 mmol/L — ABNORMAL LOW (ref 22–32)
Calcium: 8.3 mg/dL — ABNORMAL LOW (ref 8.9–10.3)
Chloride: 110 mmol/L (ref 98–111)
Creatinine, Ser: 1.72 mg/dL — ABNORMAL HIGH (ref 0.61–1.24)
GFR, Estimated: 52 mL/min — ABNORMAL LOW (ref >60)
Glucose, Bld: 132 mg/dL — ABNORMAL HIGH (ref 70–99)
Potassium: 3.5 mmol/L (ref 3.5–5.1)
Sodium: 136 mmol/L (ref 135–145)

## 2023-01-23 LAB — CBC WITH DIFFERENTIAL/PLATELET
Abs Immature Granulocytes: 0.05 10*3/uL (ref 0.00–0.07)
Basophils Absolute: 0.1 10*3/uL (ref 0.0–0.1)
Basophils Relative: 0 %
Eosinophils Absolute: 0.4 10*3/uL (ref 0.0–0.5)
Eosinophils Relative: 3 %
HCT: 34.5 % — ABNORMAL LOW (ref 39.0–52.0)
Hemoglobin: 11.8 g/dL — ABNORMAL LOW (ref 13.0–17.0)
Immature Granulocytes: 0 %
Lymphocytes Relative: 21 %
Lymphs Abs: 2.8 10*3/uL (ref 0.7–4.0)
MCH: 34.7 pg — ABNORMAL HIGH (ref 26.0–34.0)
MCHC: 34.2 g/dL (ref 30.0–36.0)
MCV: 101.5 fL — ABNORMAL HIGH (ref 80.0–100.0)
Monocytes Absolute: 0.8 10*3/uL (ref 0.1–1.0)
Monocytes Relative: 6 %
Neutro Abs: 9.1 10*3/uL — ABNORMAL HIGH (ref 1.7–7.7)
Neutrophils Relative %: 70 %
Platelets: 220 10*3/uL (ref 150–400)
RBC: 3.4 MIL/uL — ABNORMAL LOW (ref 4.22–5.81)
RDW: 15.9 % — ABNORMAL HIGH (ref 11.5–15.5)
WBC: 13.1 10*3/uL — ABNORMAL HIGH (ref 4.0–10.5)
nRBC: 0 % (ref 0.0–0.2)

## 2023-01-23 LAB — LACTIC ACID, PLASMA: Lactic Acid, Venous: 1.1 mmol/L (ref 0.5–1.9)

## 2023-01-23 MED ORDER — AMOXICILLIN-POT CLAVULANATE 875-125 MG PO TABS
1.0000 | ORAL_TABLET | Freq: Once | ORAL | Status: AC
  Administered 2023-01-23: 1 via ORAL
  Filled 2023-01-23: qty 1

## 2023-01-23 MED ORDER — AMOXICILLIN-POT CLAVULANATE 875-125 MG PO TABS: 1.0000 | ORAL_TABLET | Freq: Two times a day (BID) | ORAL | 0 refills | Status: DC

## 2023-01-23 MED ORDER — SODIUM CHLORIDE 0.9 % IV BOLUS (SEPSIS)
1000.0000 mL | Freq: Once | INTRAVENOUS | Status: AC
  Administered 2023-01-23: 1000 mL via INTRAVENOUS

## 2023-01-23 MED ORDER — IOHEXOL 350 MG/ML SOLN
75.0000 mL | Freq: Once | INTRAVENOUS | Status: AC | PRN
  Administered 2023-01-23: 75 mL via INTRAVENOUS

## 2023-01-23 MED ORDER — FENTANYL CITRATE PF 50 MCG/ML IJ SOSY
50.0000 ug | PREFILLED_SYRINGE | Freq: Once | INTRAMUSCULAR | Status: AC
  Administered 2023-01-23: 50 ug via INTRAVENOUS
  Filled 2023-01-23: qty 1

## 2023-01-23 MED ORDER — FENTANYL CITRATE PF 50 MCG/ML IJ SOSY
100.0000 ug | PREFILLED_SYRINGE | Freq: Once | INTRAMUSCULAR | Status: AC
  Administered 2023-01-23: 100 ug via INTRAVENOUS
  Filled 2023-01-23: qty 2

## 2023-01-23 MED ORDER — HYDROCODONE-ACETAMINOPHEN 5-325 MG PO TABS
1.0000 | ORAL_TABLET | Freq: Once | ORAL | Status: AC
  Administered 2023-01-23: 1 via ORAL
  Filled 2023-01-23: qty 1

## 2023-01-23 NOTE — ED Provider Notes (Signed)
[place] EMERGENCY DEPARTMENT AT [facility] Provider Note   CSN: [id number] Arrival date & time: 01/22/23  2319     History  Chief Complaint  Patient presents with   Tailbone Pain    Shawn [name] is a 37 y.o. male.  The history is provided by the patient.  Patient with history of Crohn's presents with multiple complaints.  He reports chronic tailbone pain for years, but reports it is getting worse.  He also reports lumbar back pain.  No fevers or vomiting.  No new incontinence.  No new leg weakness. Patient is concerned as he has had a "infection in my tailbone" and thinks it is getting worse He also reports he may be developing a new rectal abscess.  He reports chronic drainage from a fistula. No new abdominal pain.      Home Medications Prior to Admission medications   Medication Sig Start Date End Date Taking? Authorizing Provider  amoxicillin-clavulanate (AUGMENTIN) 875-125 MG tablet Take 1 tablet by mouth every 12 (twelve) hours. 01/23/23  Yes Zadie Rhine, MD  multivitamin-iron-minerals-folic acid (CENTRUM) chewable tablet Chew 1 tablet by mouth daily. Patient not taking: Reported on 01/05/2023 07/10/21   Karie Soda, MD  ustekinumab [facility]) 90 MG/ML SOSY injection Inject 1 mL (90 mg total) into the skin every 8 (eight) weeks. no further refills unless appt is kept 01/19/23   Meryl Dare, MD  vitamin B-12 1000 MCG tablet Take 1 tablet (1,000 mcg total) by mouth daily. Patient not taking: Reported on 01/05/2023 07/10/21   Karie Soda, MD      Allergies    Shrimp [shellfish allergy] and Nsaids    Review of Systems   Review of Systems  Constitutional:  Negative for fever.  Musculoskeletal:  Positive for back pain.    Physical Exam Updated Vital Signs BP 133/78 (BP Location: Right Arm)   Pulse 68   Temp 97.8 F (36.6 C) (Oral)   Resp 16   Wt 90.3 kg   SpO2 100%   BMI 26.25 kg/m  Physical Exam CONSTITUTIONAL: Well developed/well  nourished HEAD: Normocephalic/atraumatic EYES: EOMI/PERRL ENMT: Mucous membranes moist NECK: supple no meningeal signs SPINE/BACK: No thoracic tenderness.  Mild lumbar tenderness but no overlying erythema, no edema or warmth. Mild tenderness over the coccyx, but no erythema CV: S1/S2 noted, no murmurs/rubs/gallops noted LUNGS: Lungs are clear to auscultation bilaterally, no apparent distress ABDOMEN: soft, nontender GU: Rectal exam performed with nurse present. Significant drainage is noted from the fistula. Burnadette Pop is in place NEURO: Pt is awake/alert/appropriate, moves all extremitiesx4.  No facial droop.  He has appropriate strength in bilateral lower extremities, equal power with movement of both legs EXTREMITIES: pulses normal/equal, full ROM SKIN: warm, color normal PSYCH: no abnormalities of mood noted, alert and oriented to situation  ED Results / Procedures / Treatments   Labs (all labs ordered are listed, but only abnormal results are displayed) Labs Reviewed  CBC WITH DIFFERENTIAL/PLATELET - Abnormal; Notable for the following components:      Result Value   WBC 13.1 (*)    RBC 3.40 (*)    Hemoglobin 11.8 (*)    HCT 34.5 (*)    MCV 101.5 (*)    MCH 34.7 (*)    RDW 15.9 (*)    Neutro Abs 9.1 (*)    All other components within normal limits  BASIC METABOLIC PANEL - Abnormal; Notable for the following components:   CO2 19 (*)    Glucose, Bld 132 (*)  BUN 22 (*)    Creatinine, Ser 1.72 (*)    Calcium 8.3 (*)    GFR, Estimated 52 (*)    All other components within normal limits  LACTIC ACID, PLASMA    EKG None  Radiology CT PELVIS W CONTRAST  Result Date: 01/23/2023 CLINICAL DATA:  37 year old male with perianal abscess or fistula suspected. EXAM: CT PELVIS WITH CONTRAST TECHNIQUE: Multidetector CT imaging of the pelvis was performed using the standard protocol following the bolus administration of intravenous contrast. RADIATION DOSE REDUCTION: This exam was  performed according to the departmental dose-optimization program which includes automated exposure control, adjustment of the mA and/or kV according to patient size and/or use of iterative reconstruction technique. CONTRAST:  75mL OMNIPAQUE IOHEXOL 350 MG/ML SOLN COMPARISON:  CT of the abdomen and pelvis 03/25/2022. FINDINGS: Urinary Tract:  No abnormality visualized. Bowel: Visualized portions of small bowel and colon are unremarkable. In the distal rectum (axial image 100 of series 6) there is a focal outpouching posterolaterally on the left from approximately 3:00 to 5:00, with abnormal soft tissue which extends into the adjacent mesorectal fat, thickening the left levator ani musculature, and extending posteriorly where this soft tissues contiguous with a gas and fluid containing collection which extends cephalad along the anterior surface of the sacrum (best appreciated on sagittal image 108 of series 10), and extends caudally through the levator ani musculature into the ischioanal fat, ultimately extending to the skin surface in the region of the gluteal cleft at approximately 6 o'clock. A seton remains in position within this fistula. Vascular/Lymphatic: No pathologically enlarged lymph nodes. No significant vascular abnormality seen. Reproductive: Prostate gland and seminal vesicles are unremarkable in appearance. Other: No significant volume of ascites and no pneumoperitoneum noted in the visualized portions of the peritoneal cavity. Musculoskeletal: No aggressive appearing lytic or blastic lesions are noted in the visualized portions of the skeleton. IMPRESSION: 1. 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 Overall, this is very similar to the prior study. Notably, there continues to be a seton in this collection. Electronically Signed   By: Trudie Reed M.D.   On: 01/23/2023 06:34    Procedures Procedures    Medications Ordered in ED Medications  HYDROcodone-acetaminophen (NORCO/VICODIN) 5-325 MG per tablet 1 tablet (has no administration in time range)  amoxicillin-clavulanate (AUGMENTIN) 875-125 MG per tablet 1 tablet (has no administration in time range)  fentaNYL (SUBLIMAZE) injection 50 mcg (50 mcg Intravenous Given 01/23/23 0348)  sodium chloride 0.9 % bolus 1,000 mL (0 mLs Intravenous Stopped 01/23/23 0704)  fentaNYL (SUBLIMAZE) injection 100 mcg (100 mcg Intravenous Given 01/23/23 0503)  iohexol (OMNIPAQUE) 350 MG/ML injection 75 mL (75 mLs Intravenous Contrast Given 01/23/23 0532)    ED Course/ Medical Decision Making/ A&P Clinical Course as of 01/23/23 0713  Sun Jan 23, 2023  0255 Patient has a difficult history.  He does report a history of chronic back pain and tailbone pain since a previous coccyx infection.  However he reports it is getting worse.  He also has a chronic fistula with a Seton in place.  Patient is overall in no acute distress and afebrile.  Plan to check labs and likely CT pelvis. Patient is ambulatory, no focal neurodeficits are  noted in his lower extremities [DW]  0456 WBC(!): 13.1 Leukocytosis [DW]  0459 Patient still reporting pain.  He is awaiting CT imaging [DW]  [phone] Discussed the case with Dr. Andrey Campanile who is reviewed imaging. No indication for admission or operative management.  He recommends starting oral antibiotics and close follow-up as an outpatient with general surgery [DW]  0711 Discussed imaging findings with patient.  He has follow-up with gastroenterology and general surgery in about a week.  Will start oral antibiotics.  Also advised him to increase his fluid intake as he does have evidence of renal insufficiency.  Patient notes the most of  his pain is chronic in nature, and was recently seen by pain management. Hopefully with appropriate management of his Crohn's and a course of antibiotics his pain will improve [DW]  [phone] It is noted the patient has had intermittent renal insufficiency.  Advised this will need to be rechecked in the next 2 weeks [DW]    Clinical Course User Index [DW] Zadie Rhine, MD                             Medical Decision Making Amount and/or Complexity of Data Reviewed Labs: ordered. Decision-making details documented in ED Course. Radiology: ordered.  Risk Prescription drug management.   This patient presents to the ED for concern of back pain, this involves an extensive number of treatment options, and is a complaint that carries with it a high risk of complications and morbidity.  The differential diagnosis includes but is not limited to epidural abscess, discitis, muscle strain, chronic pain, pyelonephritis  Comorbidities that complicate the patient evaluation: Patient's presentation is complicated by their history of Crohn's  Social Determinants of Health: Patient's  history of chronic pain   increases the complexity of managing their presentation  Additional history obtained: Records reviewed Primary Care Documents  Lab Tests: I Ordered, and personally interpreted labs.  The pertinent results include: Renal insufficiency, leukocytosis  Imaging Studies ordered: I ordered imaging studies including CT scan abdomen pelvis   I independently visualized and interpreted imaging which showed perianal fistula I agree with the radiologist interpretation  Medicines ordered and prescription drug management: I ordered medication including fentanyl for pain Reevaluation of the patient after these medicines showed that the patient    improved   Critical Interventions:   antibiotics  Consultations Obtained: I requested consultation with the consultant General surgery , and discussed   findings as well as pertinent plan - they recommend: Antibiotics and follow-up  Reevaluation: After the interventions noted above, I reevaluated the patient and found that they have :stayed the same  Complexity of problems addressed: Patient's presentation is most consistent with  acute presentation with potential threat to life or bodily function  Disposition: After consideration of the diagnostic results and the patient's response to treatment,  I feel that the patent would benefit from discharge   .           Final Clinical Impression(s) / ED Diagnoses Final diagnoses:  Perianal fistula  Renal insufficiency    Rx / DC Orders ED Discharge Orders          Ordered    amoxicillin-clavulanate (AUGMENTIN) 875-125 MG tablet  Every 12 hours        01/23/23 0709              Zadie Rhine, MD 01/23/23 [phone]

## 2023-01-23 NOTE — ED Notes (Signed)
FLOAT NURSE 

## 2023-01-24 ENCOUNTER — Telehealth: Payer: Self-pay | Admitting: Gastroenterology

## 2023-01-24 ENCOUNTER — Other Ambulatory Visit (HOSPITAL_COMMUNITY): Payer: Self-pay

## 2023-01-24 NOTE — Telephone Encounter (Signed)
Patient called regarding the Stelara medication and requested a call back.

## 2023-01-24 NOTE — Telephone Encounter (Signed)
The pt states he was told that his stelara prescription was on hold.  I did confirm that his medication was approved and refill sent on 01/19/23 [facility].  He will call the insurance and let them  know if they have any further questions they could call me directly.

## 2023-01-25 ENCOUNTER — Other Ambulatory Visit: Payer: Self-pay | Admitting: Family Medicine

## 2023-01-25 ENCOUNTER — Encounter: Payer: Self-pay | Admitting: Family Medicine

## 2023-01-25 DIAGNOSIS — K50019 Crohn's disease of small intestine with unspecified complications: Secondary | ICD-10-CM

## 2023-01-26 NOTE — Telephone Encounter (Signed)
Received a call from [facility] that PA is still needed. I provided the information in the chart with PA number and she states someone will need to call [phone].

## 2023-01-27 ENCOUNTER — Other Ambulatory Visit (HOSPITAL_COMMUNITY): Payer: Self-pay

## 2023-01-27 ENCOUNTER — Telehealth: Payer: Self-pay | Admitting: Pharmacy Technician

## 2023-01-27 ENCOUNTER — Encounter: Payer: Self-pay | Admitting: Gastroenterology

## 2023-01-27 NOTE — Telephone Encounter (Signed)
Patient Advocate Encounter  Received notification from [facility] that prior authorization for STELARA 90MG  is required.   PA submitted on 6.6.24 Key [id number]  Waiting for questions to populate

## 2023-01-31 ENCOUNTER — Encounter: Payer: Self-pay | Admitting: Gastroenterology

## 2023-01-31 ENCOUNTER — Ambulatory Visit: Payer: 59 | Admitting: Gastroenterology

## 2023-01-31 ENCOUNTER — Encounter: Payer: Self-pay | Admitting: Family Medicine

## 2023-01-31 VITALS — BP 110/70 | HR 71 | Ht 73.0 in

## 2023-01-31 DIAGNOSIS — K50113 Crohn's disease of large intestine with fistula: Secondary | ICD-10-CM | POA: Diagnosis not present

## 2023-01-31 DIAGNOSIS — G8929 Other chronic pain: Secondary | ICD-10-CM | POA: Diagnosis not present

## 2023-01-31 DIAGNOSIS — Z720 Tobacco use: Secondary | ICD-10-CM | POA: Diagnosis not present

## 2023-01-31 NOTE — Patient Instructions (Signed)
_______________________________________________________  If your blood pressure at your visit was 140/90 or greater, please contact your primary care physician to follow up on this.  _______________________________________________________  If you are age 39 or older, your body mass index should be between 23-30. Your Body mass index is 26.25 kg/m. If this is out of the aforementioned range listed, please consider follow up with your Primary Care Provider.  If you are age 83 or younger, your body mass index should be between 19-25. Your Body mass index is 26.25 kg/m. If this is out of the aformentioned range listed, please consider follow up with your Primary Care Provider.   ________________________________________________________  The [facility] providers would like to encourage you to use [facility] to communicate with providers for non-urgent requests or questions.  Due to long hold times on the telephone, sending your provider a message by [facility] may be a faster and more efficient way to get a response.  Please allow 48 business hours for a response.  Please remember that this is for non-urgent requests.  _______________________________________________________  We will continue to work on getting Stelara approved.  Please follow up in six months

## 2023-01-31 NOTE — Progress Notes (Signed)
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 He is one week overdue for his dose awaiting insurance approval  Return to Dr. 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  He was managed with infliximab until January 2024 when he was changed to ustekinumab Marcy Panning).  He has been overdue for an office visit - his last office visit in June 2023.  His last office [facility] at [facility] was in May 2023 and he is followed by Dr. Michaell Cowing.  He was seen in the ED for lumbar back pain and chronic tailbone pain.  Pelvic CT as below. He has chronic rectal pain and mild drainage. No diarrhea, rectal bleeding. His weight is stable and appetite is good. His insurance recently changed from BC/BS to [place] and he is awaiting Stelara approval and is currently 1 week overdue.    Labs / Imaging       Latest Ref Rng & Units 01/10/2023    9:20 AM 03/25/2022   10:49 AM 01/16/2022    4:05 AM  Hepatic Function  Total Protein 6.0 - 8.5 g/dL 6.9  7.7  6.9   Albumin 4.1 - 5.1 g/dL 3.9  3.3  3.2   AST 0 - 40 IU/L 20  23  19    ALT 0 - 44 IU/L 29  27  23    Alk Phosphatase 44 - 121 IU/L 141  121  100   Total Bilirubin 0.0 - 1.2 mg/dL 0.6  0.7  0.6        Latest Ref Rng & Units 01/23/2023    3:46 AM 01/10/2023    9:20 AM 03/25/2022   10:49 AM  CBC  WBC 4.0 - 10.5 K/uL 13.1  10.1  7.7   Hemoglobin 13.0 - 17.0 g/dL 16.1  09.6  04.5   Hematocrit 39.0 - 52.0 % 34.5  39.5  37.6   Platelets 150 - 400 K/uL 220  285  294      CT PELVIS W CONTRAST CLINICAL DATA:  37 year old male with perianal abscess or fistula suspected.  EXAM: CT PELVIS WITH CONTRAST  TECHNIQUE: Multidetector CT imaging of the pelvis was performed using the standard protocol following the bolus  administration of intravenous contrast.  RADIATION DOSE REDUCTION: This exam was performed according to the departmental dose-optimization program which includes automated exposure control, adjustment of the mA and/or kV according to patient size and/or use of iterative reconstruction technique.  CONTRAST:  75mL OMNIPAQUE IOHEXOL 350 MG/ML SOLN  COMPARISON:  CT of the abdomen and pelvis 03/25/2022.  FINDINGS: Urinary Tract:  No abnormality visualized.  Bowel: Visualized portions of small bowel and colon are unremarkable. In the distal rectum (axial image 100 of series 6) there is a focal outpouching posterolaterally on the left from approximately 3:00 to 5:00, with abnormal soft tissue which extends into the adjacent mesorectal fat, thickening the left levator ani musculature, and extending posteriorly where this soft tissues contiguous with a gas and fluid containing collection which extends cephalad along the anterior surface of the sacrum (best appreciated on sagittal image 108 of series 10), and extends caudally through the levator ani musculature into the ischioanal fat, ultimately extending to the skin surface in the region of the gluteal cleft  at approximately 6 o'clock. A seton remains in position within this fistula.  Vascular/Lymphatic: No pathologically enlarged lymph nodes. No significant vascular abnormality seen.  Reproductive: Prostate gland and seminal vesicles are unremarkable in appearance.  Other: No significant volume of ascites and no pneumoperitoneum noted in the visualized portions of the peritoneal cavity.  Musculoskeletal: No aggressive appearing lytic or blastic lesions are noted in the visualized portions of the skeleton.  IMPRESSION: 1. 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 Overall, this is very similar to the prior study. Notably, there continues to be a seton in this collection.  Electronically Signed   By: Trudie Reed M.D.   On: 01/23/2023 06:34   Current Medications, Allergies, Past Medical History, Past Surgical History, Family History and Social History were reviewed in [employer] record.   Physical Exam: General: Well developed, well nourished, no acute distress Head: Normocephalic and atraumatic Eyes: Sclerae anicteric, EOMI Ears: Normal auditory acuity Mouth: No deformities or lesions noted Lungs: Clear throughout to auscultation Heart: Regular rate and rhythm; No murmurs, rubs or bruits Abdomen: Soft, non tender and non distended. No masses, hepatosplenomegaly or hernias noted. Normal Bowel sounds Rectal: Not done Musculoskeletal: Symmetrical with no gross deformities  Pulses:  Normal pulses noted Extremities: No edema or deformities noted Neurological: Alert oriented x 4, grossly nonfocal Psychological:  Alert and cooperative. Normal mood and affect   Anaika Santillano T. Russella Dar, MD 01/31/2023, 9:47 AM

## 2023-01-31 NOTE — Telephone Encounter (Signed)
Questions generated, and has been submitted with chart notes.

## 2023-02-01 ENCOUNTER — Telehealth: Payer: Self-pay

## 2023-02-01 ENCOUNTER — Other Ambulatory Visit: Payer: Self-pay | Admitting: Family Medicine

## 2023-02-01 DIAGNOSIS — K50113 Crohn's disease of large intestine with fistula: Secondary | ICD-10-CM

## 2023-02-01 DIAGNOSIS — G8929 Other chronic pain: Secondary | ICD-10-CM

## 2023-02-01 DIAGNOSIS — K604 Rectal fistula, unspecified: Secondary | ICD-10-CM

## 2023-02-01 NOTE — Telephone Encounter (Signed)
-----   Message from Meryl Dare, MD sent at 02/01/2023  3:16 PM EDT ----- Patient seen in clinic by me and then Dr. Michaell Cowing yesterday. After discussion with Dr. Michaell Cowing we should reassess his Crohn's activity with a CRP, ESR and colonoscopy. He was waiting for Stelara approval to continue on as planned.

## 2023-02-02 ENCOUNTER — Other Ambulatory Visit (HOSPITAL_COMMUNITY): Payer: Self-pay

## 2023-02-02 NOTE — Telephone Encounter (Signed)
Labs entered   Pt needs to have colon in the [facility] set up with Dr Russella Dar and previsit.  Line rings busy

## 2023-02-03 NOTE — Telephone Encounter (Signed)
Line rings then goes busy 

## 2023-02-04 NOTE — Telephone Encounter (Signed)
Line rings and goes busy will mail letter and send My Chart

## 2023-02-07 ENCOUNTER — Other Ambulatory Visit (INDEPENDENT_AMBULATORY_CARE_PROVIDER_SITE_OTHER): Payer: 59

## 2023-02-07 DIAGNOSIS — K50113 Crohn's disease of large intestine with fistula: Secondary | ICD-10-CM | POA: Diagnosis not present

## 2023-02-07 LAB — SEDIMENTATION RATE: Sed Rate: 51 mm/hr — ABNORMAL HIGH (ref 0–15)

## 2023-02-07 LAB — HIGH SENSITIVITY CRP: CRP, High Sensitivity: 7.9 mg/L — ABNORMAL HIGH (ref 0.000–5.000)

## 2023-02-08 ENCOUNTER — Encounter: Payer: Self-pay | Admitting: Gastroenterology

## 2023-02-08 ENCOUNTER — Telehealth: Payer: Self-pay | Admitting: Pharmacy Technician

## 2023-02-08 ENCOUNTER — Other Ambulatory Visit (HOSPITAL_COMMUNITY): Payer: Self-pay

## 2023-02-08 NOTE — Telephone Encounter (Signed)
Patient Advocate Encounter  Received notification from [employer] that prior authorization for [facility] is required.   PA submitted on 6.18.24 Key [id number] Status is pending

## 2023-02-09 ENCOUNTER — Other Ambulatory Visit (HOSPITAL_COMMUNITY): Payer: Self-pay

## 2023-02-09 NOTE — Telephone Encounter (Signed)
Noted the pt has been updated.  Dr Russella Dar please see the note below regarding Stelara

## 2023-02-10 NOTE — Telephone Encounter (Signed)
Dr Russella Dar did you see the message from [facility] about his pain?

## 2023-02-10 NOTE — Telephone Encounter (Signed)
Dr Russella Dar see the message from the pt- the insurance has been the issue with getting approval. It was submitted again on Tuesday with his alternate plan. Hopefully we will get a response soon.

## 2023-02-11 ENCOUNTER — Other Ambulatory Visit: Payer: Self-pay | Admitting: Family Medicine

## 2023-02-11 ENCOUNTER — Encounter: Payer: Self-pay | Admitting: Family Medicine

## 2023-02-11 MED ORDER — TIZANIDINE HCL 4 MG PO TABS: 4.0000 mg | ORAL_TABLET | Freq: Three times a day (TID) | ORAL | 1 refills | Status: DC | PRN

## 2023-02-11 MED ORDER — DULOXETINE HCL 60 MG PO CPEP: 60.0000 mg | ORAL_CAPSULE | Freq: Every day | ORAL | 3 refills | Status: DC

## 2023-02-14 ENCOUNTER — Encounter: Payer: Self-pay | Admitting: Gastroenterology

## 2023-02-14 NOTE — Telephone Encounter (Signed)
Dr Russella Dar see the pt note regarding Stelara

## 2023-02-18 ENCOUNTER — Encounter (HOSPITAL_COMMUNITY): Payer: Self-pay | Admitting: *Deleted

## 2023-02-18 ENCOUNTER — Emergency Department (HOSPITAL_COMMUNITY)
Admission: EM | Admit: 2023-02-18 | Discharge: 2023-02-18 | Disposition: A | Discharge status: Home / Self Care | Payer: 59 | Attending: Emergency Medicine | Admitting: Emergency Medicine

## 2023-02-18 ENCOUNTER — Other Ambulatory Visit: Payer: Self-pay

## 2023-02-18 DIAGNOSIS — M25559 Pain in unspecified hip: Secondary | ICD-10-CM | POA: Insufficient documentation

## 2023-02-18 DIAGNOSIS — K5090 Crohn's disease, unspecified, without complications: Secondary | ICD-10-CM | POA: Insufficient documentation

## 2023-02-18 DIAGNOSIS — M545 Low back pain, unspecified: Secondary | ICD-10-CM | POA: Diagnosis not present

## 2023-02-18 DIAGNOSIS — G8929 Other chronic pain: Secondary | ICD-10-CM | POA: Diagnosis not present

## 2023-02-18 MED ORDER — LIDOCAINE 5 % EX PTCH: 1.0000 | MEDICATED_PATCH | CUTANEOUS | 0 refills | Status: DC

## 2023-02-18 MED ORDER — ACETAMINOPHEN 500 MG PO TABS
1000.0000 mg | ORAL_TABLET | Freq: Once | ORAL | Status: AC
  Administered 2023-02-18: 1000 mg via ORAL
  Filled 2023-02-18: qty 2

## 2023-02-18 MED ORDER — TIZANIDINE HCL 4 MG PO TABS: 4.0000 mg | ORAL_TABLET | Freq: Three times a day (TID) | ORAL | 1 refills | Status: DC | PRN

## 2023-02-18 MED ORDER — ACETAMINOPHEN 500 MG PO TABS: 1000.0000 mg | ORAL_TABLET | Freq: Three times a day (TID) | ORAL | 0 refills | Status: DC | PRN

## 2023-02-18 MED ORDER — LIDOCAINE 5 % EX PTCH
1.0000 | MEDICATED_PATCH | CUTANEOUS | Status: DC
  Administered 2023-02-18: 1 via TRANSDERMAL
  Filled 2023-02-18: qty 1

## 2023-02-18 MED ORDER — OXYCODONE HCL 5 MG PO TABS: 2.5000 mg | ORAL_TABLET | Freq: Four times a day (QID) | ORAL | 0 refills | Status: AC | PRN

## 2023-02-18 NOTE — ED Triage Notes (Signed)
C/o back pain onset several months states it hurts to walk and he can't sleep. Staets he has been seen earlier for same.

## 2023-02-18 NOTE — Telephone Encounter (Signed)
Pharmacy Patient Advocate Encounter  Received notification from [employer] that Prior Authorization for Shawn [name] has been DENIED because :. Plan requires more documentation showing patient has shown positive response or maintained remission for continuation of therapy.    PA #/Case ID/Reference #: [id number] - [id number]  Full Denial letter will be uploaded to media tab. Contact for appeals are as follows: Phone: [phone], Fax: [phone]

## 2023-02-18 NOTE — ED Provider Notes (Signed)
[facility] EMERGENCY DEPARTMENT AT [facility] Provider Note   CSN: [id number] Arrival date & time: 02/18/23  7829     History Chief Complaint  Patient presents with   Back Pain    HPI Shawn [name] is a 37 y.o. male presenting for acute on chronic back pain.  38 year old male history of Crohn's disease.  Endorses daily low back to hip pain worse in the morning. Follows with the PCP who sent him to spine center last month who diagnosed no acute disease recommended they continue with treatment with the PCP. Was on pain medication for an extended amount of time looking back in his chart but appears to have stopped earlier this year. Recent increased musculoskeletal stressors.  Pain worse in his low back today than most days.  Denies numbness tingling syncope shortness of breath IV drug use, numbness or tingling.   Patient's recorded medical, surgical, social, medication list and allergies were reviewed in the Snapshot window as part of the initial history.   Review of Systems   Review of Systems  Constitutional:  Negative for chills and fever.  HENT:  Negative for ear pain and sore throat.   Eyes:  Negative for pain and visual disturbance.  Respiratory:  Negative for cough and shortness of breath.   Cardiovascular:  Negative for chest pain and palpitations.  Gastrointestinal:  Negative for abdominal pain and vomiting.  Genitourinary:  Negative for dysuria and hematuria.  Musculoskeletal:  Positive for back pain. Negative for arthralgias.  Skin:  Negative for color change and rash.  Neurological:  Negative for seizures and syncope.  All other systems reviewed and are negative.   Physical Exam Updated Vital Signs BP (!) 134/90   Pulse (!) 53   Temp 98.3 F (36.8 C) (Oral)   Resp 16   Ht 6\' 1"  (1.854 m)   Wt 89.8 kg   SpO2 100%   BMI 26.12 kg/m  Physical Exam Vitals and nursing note reviewed.  Constitutional:      General: He is not in acute distress.     Appearance: He is well-developed.  HENT:     Head: Normocephalic and atraumatic.  Eyes:     Conjunctiva/sclera: Conjunctivae normal.  Cardiovascular:     Rate and Rhythm: Normal rate and regular rhythm.     Heart sounds: No murmur heard. Pulmonary:     Effort: Pulmonary effort is normal. No respiratory distress.     Breath sounds: Normal breath sounds.  Abdominal:     Palpations: Abdomen is soft.     Tenderness: There is no abdominal tenderness.  Musculoskeletal:        General: No swelling.     Cervical back: Neck supple.  Skin:    General: Skin is warm and dry.     Capillary Refill: Capillary refill takes less than 2 seconds.  Neurological:     Mental Status: He is alert.  Psychiatric:        Mood and Affect: Mood normal.      ED Course/ Medical Decision Making/ A&P     Procedures Procedures   Medications Ordered in ED Medications - No data to display Medical Decision Making:   Shawn [name] is a 37 y.o. male who presented to the ED today with acute lower back pain over the past 96 hours, detailed above.    Patient's presentation is complicated by their history of multiple comorbid medical problems.  Patient placed on continuous vitals and telemetry monitoring while in ED  which was reviewed periodically.   On my initial exam, the pt was with an intact neurologic exam, tolerating ambulation with an antalgic gait and p.o. intake without difficulty.  Patient had no abnormal DTRs, no midline spinal tenderness.  Patient endorsing complete sensation of the perineum.  Patient without episodes of fecal or urinary incontinence.  Patient has no focal neurologic deficits and reassuring vital signs at this time.  No obvious physical abnormality or injury on exam. Notably, patient denies recent trauma, is afebrile, and denies IVDU.    Reviewed and confirmed nursing documentation for past medical history, family history, social history.    Initial Assessment:   With the  patient's presentation of acute back pain in the above setting, most likely diagnosis is musculoskeletal strain. Other diagnoses were considered including (but not limited to) underlying fracture, epidural hematoma, cauda equina syndrome, spinal stenosis, spinal malignancy. These are considered less likely due to history of present illness and physical exam findings.   In particular, lack of fever, substantial history of IV drug use, or substantial neurologic abnormality is less consistent with epidural abscess versus discitis or other spinal infection. In particular, patient's history present on his physicals and findings do seem consistent with sacroiliitis given his history of Crohn's disease, locality to his lower hips. Unfortunately he is not a candidate for NSAID therapy due to his history of Crohn's disease and kidney disease. Does have a history of alcohol abuse and longstanding pain medication utilization which makes him high risk for any treatment options.  However patient is in acute distress.  Initial Plan:  Multimodal pain control described and patient informed on safe usage.  Will provide short course of narcotic pain control patient titrates onto the Zanaflex.  He did not realize that it had been prescribed by his PCP.  He will also titrate onto Tylenol and Lidoderm patches daily. Longstanding history of narcotic utilization the outpatient setting with sudden discontinuation earlier this year.  Educated patient on risk of continued narcotic utilization and importance on titrating off of these medications as rapidly as possible while cross titrating to the supportive care options listed above. Patient stable for continued outpatient evaluation and management of their musculoskeletal pains.  Patient referred back to primary care provider for continued evaluation and management.   Disposition:   Based on the above findings, I believe patient is stable for discharge.    Patient and family  educated about specific return precautions for given chief complaint and symptoms.  Patient and family educated about follow-up with PCP.  Patient and family expressed understanding of return precautions and need for follow-up. Patient spoken to regarding all imaging and laboratory results and appropriate follow up for these results. All education provided in verbal and written form and time was allowed for answering of patient questions. Patient discharged.          Emergency Department Medication Summary:   Medications  acetaminophen (TYLENOL) tablet 1,000 mg (has no administration in time range)  lidocaine (LIDODERM) 5 % 1 patch (has no administration in time range)      Clinical Impression:  1. Low back pain, unspecified back pain laterality, unspecified chronicity, unspecified whether sciatica present      Data Unavailable   Final Clinical Impression(s) / ED Diagnoses Final diagnoses:  Low back pain, unspecified back pain laterality, unspecified chronicity, unspecified whether sciatica present    Rx / DC Orders ED Discharge Orders     None         Xxavier Noon,  Almeta Monas, MD 02/18/23 [phone]

## 2023-02-18 NOTE — Telephone Encounter (Signed)
The pt has been advised that his Marcy Panning has been approved.

## 2023-02-18 NOTE — Telephone Encounter (Signed)
Call placed to insurance to expedite appeal for stelara I have also faxed recent office notes to the insurance fax provided.  [phone]

## 2023-02-18 NOTE — Telephone Encounter (Signed)
PA Approved through 02/18/2024

## 2023-02-23 DIAGNOSIS — M5451 Vertebrogenic low back pain: Secondary | ICD-10-CM | POA: Diagnosis not present

## 2023-03-15 DIAGNOSIS — M545 Low back pain, unspecified: Secondary | ICD-10-CM | POA: Diagnosis not present

## 2023-03-15 DIAGNOSIS — M533 Sacrococcygeal disorders, not elsewhere classified: Secondary | ICD-10-CM | POA: Diagnosis not present

## 2023-03-22 ENCOUNTER — Encounter: Payer: Self-pay | Admitting: Gastroenterology

## 2023-03-23 ENCOUNTER — Ambulatory Visit (AMBULATORY_SURGERY_CENTER): Payer: 59

## 2023-03-23 VITALS — Ht 73.0 in | Wt 195.0 lb

## 2023-03-23 DIAGNOSIS — K50113 Crohn's disease of large intestine with fistula: Secondary | ICD-10-CM

## 2023-03-23 MED ORDER — NA SULFATE-K SULFATE-MG SULF 17.5-3.13-1.6 GM/177ML PO SOLN: 1.0000 | Freq: Once | ORAL | 0 refills | Status: AC

## 2023-03-23 NOTE — Progress Notes (Signed)
No egg or soy allergy known to patient  No issues known to pt with past sedation with any surgeries or procedures Patient denies ever being told they had issues or difficulty with intubation  No FH of Malignant Hyperthermia Pt is not on diet pills Pt is not on  home 02  Pt is not on blood thinners  Pt states he has occasional problems with chronic constipation; states he drinks fluids--coffee  No A fib or A flutter Have any cardiac testing pending--no Ambulates independently Pt instructed to use [email or web address] or GoodRx for a price reduction on prep

## 2023-04-03 ENCOUNTER — Emergency Department (HOSPITAL_COMMUNITY): Payer: 59

## 2023-04-03 ENCOUNTER — Emergency Department (HOSPITAL_COMMUNITY)
Admission: EM | Admit: 2023-04-03 | Discharge: 2023-04-04 | Disposition: A | Discharge status: Home / Self Care | Payer: 59 | Attending: Emergency Medicine | Admitting: Emergency Medicine

## 2023-04-03 ENCOUNTER — Other Ambulatory Visit: Payer: Self-pay

## 2023-04-03 DIAGNOSIS — M533 Sacrococcygeal disorders, not elsewhere classified: Secondary | ICD-10-CM | POA: Diagnosis not present

## 2023-04-03 DIAGNOSIS — D72829 Elevated white blood cell count, unspecified: Secondary | ICD-10-CM | POA: Insufficient documentation

## 2023-04-03 DIAGNOSIS — M8660 Other chronic osteomyelitis, unspecified site: Secondary | ICD-10-CM

## 2023-04-03 DIAGNOSIS — I700 Atherosclerosis of aorta: Secondary | ICD-10-CM | POA: Diagnosis not present

## 2023-04-03 DIAGNOSIS — R0602 Shortness of breath: Secondary | ICD-10-CM | POA: Insufficient documentation

## 2023-04-03 DIAGNOSIS — R42 Dizziness and giddiness: Secondary | ICD-10-CM | POA: Diagnosis not present

## 2023-04-03 DIAGNOSIS — M4628 Osteomyelitis of vertebra, sacral and sacrococcygeal region: Secondary | ICD-10-CM | POA: Diagnosis not present

## 2023-04-03 DIAGNOSIS — R55 Syncope and collapse: Secondary | ICD-10-CM | POA: Diagnosis not present

## 2023-04-03 DIAGNOSIS — K449 Diaphragmatic hernia without obstruction or gangrene: Secondary | ICD-10-CM | POA: Diagnosis not present

## 2023-04-03 LAB — COMPREHENSIVE METABOLIC PANEL
ALT: 27 U/L (ref 0–44)
AST: 21 U/L (ref 15–41)
Albumin: 3.8 g/dL (ref 3.5–5.0)
Alkaline Phosphatase: 123 U/L (ref 38–126)
Anion gap: 9 (ref 5–15)
BUN: 23 mg/dL — ABNORMAL HIGH (ref 6–20)
CO2: 20 mmol/L — ABNORMAL LOW (ref 22–32)
Calcium: 8.6 mg/dL — ABNORMAL LOW (ref 8.9–10.3)
Chloride: 106 mmol/L (ref 98–111)
Creatinine, Ser: 1.58 mg/dL — ABNORMAL HIGH (ref 0.61–1.24)
GFR, Estimated: 57 mL/min — ABNORMAL LOW (ref >60)
Glucose, Bld: 98 mg/dL (ref 70–99)
Potassium: 4 mmol/L (ref 3.5–5.1)
Sodium: 135 mmol/L (ref 135–145)
Total Bilirubin: 0.6 mg/dL (ref 0.3–1.2)
Total Protein: 8.4 g/dL — ABNORMAL HIGH (ref 6.5–8.1)

## 2023-04-03 LAB — CBC
HCT: 35.8 % — ABNORMAL LOW (ref 39.0–52.0)
Hemoglobin: 11.8 g/dL — ABNORMAL LOW (ref 13.0–17.0)
MCH: 32.2 pg (ref 26.0–34.0)
MCHC: 33 g/dL (ref 30.0–36.0)
MCV: 97.5 fL (ref 80.0–100.0)
Platelets: 327 10*3/uL (ref 150–400)
RBC: 3.67 MIL/uL — ABNORMAL LOW (ref 4.22–5.81)
RDW: 15.9 % — ABNORMAL HIGH (ref 11.5–15.5)
WBC: 12.7 10*3/uL — ABNORMAL HIGH (ref 4.0–10.5)
nRBC: 0 % (ref 0.0–0.2)

## 2023-04-03 LAB — TROPONIN I (HIGH SENSITIVITY): Troponin I (High Sensitivity): 2 ng/L (ref <18)

## 2023-04-03 MED ORDER — SODIUM CHLORIDE 0.9 % IV BOLUS
1000.0000 mL | Freq: Once | INTRAVENOUS | Status: AC
  Administered 2023-04-03: 1000 mL via INTRAVENOUS

## 2023-04-03 MED ORDER — ONDANSETRON HCL 4 MG/2ML IJ SOLN
4.0000 mg | Freq: Once | INTRAMUSCULAR | Status: AC
  Administered 2023-04-03: 4 mg via INTRAVENOUS
  Filled 2023-04-03: qty 2

## 2023-04-03 MED ORDER — ALBUTEROL SULFATE HFA 108 (90 BASE) MCG/ACT IN AERS
2.0000 | INHALATION_SPRAY | RESPIRATORY_TRACT | Status: DC | PRN
  Administered 2023-04-03: 2 via RESPIRATORY_TRACT
  Filled 2023-04-03: qty 6.7

## 2023-04-03 MED ORDER — METRONIDAZOLE 500 MG PO TABS: 500.0000 mg | ORAL_TABLET | Freq: Two times a day (BID) | ORAL | 0 refills | Status: DC

## 2023-04-03 MED ORDER — IOHEXOL 350 MG/ML SOLN
100.0000 mL | Freq: Once | INTRAVENOUS | Status: AC | PRN
  Administered 2023-04-03: 100 mL via INTRAVENOUS

## 2023-04-03 MED ORDER — CIPROFLOXACIN HCL 500 MG PO TABS: 500.0000 mg | ORAL_TABLET | Freq: Two times a day (BID) | ORAL | 0 refills | Status: DC

## 2023-04-03 MED ORDER — MORPHINE SULFATE (PF) 4 MG/ML IV SOLN
4.0000 mg | Freq: Once | INTRAVENOUS | Status: AC
  Administered 2023-04-03: 4 mg via INTRAVENOUS
  Filled 2023-04-03: qty 1

## 2023-04-03 NOTE — ED Triage Notes (Signed)
Pt arrived via POV. C/o SOB, and dizziness since 1500, and tailbone pain for several months.  AOx4

## 2023-04-03 NOTE — ED Provider Notes (Signed)
[place] EMERGENCY DEPARTMENT AT [facility] Provider Note   CSN: [id number] Arrival date & time: 04/03/23  1755     History  Chief Complaint  Patient presents with   Shortness of Breath   Dizziness   Tailbone Pain    Shawn [name] is a 37 y.o. male, history of crankiness, osteomyelitis, who presents to the ED secondary to 2 different complaints.  He states he has been having tailbone pain since January, that is worsening.  Denies any fevers, states that he has not had any kind of increased discharge, or anal pain.  Denies any trauma to the area.  States that he became acutely short of breath, and dizzy starting around 3 PM, when getting out of car today.  States that the dizziness has been persistent, and feels like the room is spinning.  Denies any nausea, vomiting, diarrhea, chest pain.  No radiation of the pain, down his arm, or associated diaphoresis.  Denies any trauma, or falls.  No changes in vision.  Denies headache.   Home Medications Prior to Admission medications   Medication Sig Start Date End Date Taking? Authorizing Provider  ciprofloxacin (CIPRO) 500 MG tablet Take 1 tablet (500 mg total) by mouth every 12 (twelve) hours. 04/03/23  Yes ,  L, PA  metroNIDAZOLE (FLAGYL) 500 MG tablet Take 1 tablet (500 mg total) by mouth 2 (two) times daily. 04/03/23  Yes ,  L, PA  acetaminophen (TYLENOL) 500 MG tablet Take 2 tablets (1,000 mg total) by mouth every 8 (eight) hours as needed for moderate pain. 02/18/23   Glyn Ade, MD  amoxicillin-clavulanate (AUGMENTIN) 875-125 MG tablet Take 1 tablet by mouth every 12 (twelve) hours. Patient not taking: Reported on 01/31/2023 01/23/23   Zadie Rhine, MD  DULoxetine (CYMBALTA) 60 MG capsule Take 1 capsule (60 mg total) by mouth daily. Patient not taking: Reported on 03/23/2023 02/11/23   Hoy Register, MD  lidocaine (LIDODERM) 5 % Place 1 patch onto the skin daily. Remove & Discard patch within  12 hours or as directed by MD Patient not taking: Reported on 03/23/2023 02/18/23   Glyn Ade, MD  multivitamin-iron-minerals-folic acid (CENTRUM) chewable tablet Chew 1 tablet by mouth daily. 07/10/21   Karie Soda, MD  tiZANidine (ZANAFLEX) 4 MG tablet Take 1 tablet (4 mg total) by mouth every 8 (eight) hours as needed. Patient not taking: Reported on 03/23/2023 02/18/23   Glyn Ade, MD  ustekinumab [facility]) 90 MG/ML SOSY injection Inject 1 mL (90 mg total) into the skin every 8 (eight) weeks. no further refills unless appt is kept 01/19/23   Meryl Dare, MD  vitamin B-12 1000 MCG tablet Take 1 tablet (1,000 mcg total) by mouth daily. 07/10/21   Karie Soda, MD      Allergies    Shrimp [shellfish allergy] and Nsaids    Review of Systems   Review of Systems  Respiratory:  Positive for shortness of breath.   Cardiovascular:  Negative for chest pain.  Neurological:  Positive for dizziness.    Physical Exam Updated Vital Signs BP (!) 145/88   Pulse 68   Temp (!) 97.4 F (36.3 C) (Oral)   Resp 18   Ht 6\' 1"  (1.854 m)   Wt 88.5 kg   SpO2 100%   BMI 25.73 kg/m  Physical Exam Vitals and nursing note reviewed.  Constitutional:      General: He is not in acute distress.    Appearance: He is well-developed.  HENT:  Head: Normocephalic and atraumatic.  Eyes:     Conjunctiva/sclera: Conjunctivae normal.  Cardiovascular:     Rate and Rhythm: Normal rate and regular rhythm.     Heart sounds: No murmur heard. Pulmonary:     Effort: Pulmonary effort is normal. No respiratory distress.     Breath sounds: Normal breath sounds.  Abdominal:     Palpations: Abdomen is soft.     Tenderness: There is no abdominal tenderness.  Musculoskeletal:        General: No swelling.     Cervical back: Neck supple.     Comments: Tenderness to palpation of coccyx, without erythema, induration, or fluctuance.  Skin:    General: Skin is warm and dry.     Capillary Refill:  Capillary refill takes less than 2 seconds.     Comments: +setons.   Neurological:     Mental Status: He is alert.  Psychiatric:        Mood and Affect: Mood normal.     ED Results / Procedures / Treatments   Labs (all labs ordered are listed, but only abnormal results are displayed) Labs Reviewed  CBC - Abnormal; Notable for the following components:      Result Value   WBC 12.7 (*)    RBC 3.67 (*)    Hemoglobin 11.8 (*)    HCT 35.8 (*)    RDW 15.9 (*)    All other components within normal limits  COMPREHENSIVE METABOLIC PANEL - Abnormal; Notable for the following components:   CO2 20 (*)    BUN 23 (*)    Creatinine, Ser 1.58 (*)    Calcium 8.6 (*)    Total Protein 8.4 (*)    GFR, Estimated 57 (*)    All other components within normal limits  TROPONIN I (HIGH SENSITIVITY)    EKG EKG Interpretation Date/Time:  Sunday April 03 2023 18:59:36 EDT Ventricular Rate:  67 PR Interval:  127 QRS Duration:  88 QT Interval:  370 QTC Calculation: 391 R Axis:   86  Text Interpretation: Sinus rhythm Consider left ventricular hypertrophy No significant change since last tracing Confirmed by Richardean Canal [phone]) on 04/03/2023 8:53:13 PM  Radiology CT Angio Chest PE W and/or Wo Contrast  Result Date: 04/03/2023 CLINICAL DATA:  crohns disease, coccyx pain, hx of fistula; Pulmonary embolism (PE) suspected, high prob severe dizziness and SOB EXAM: CT ANGIOGRAPHY CHEST CT ABDOMEN AND PELVIS WITH CONTRAST TECHNIQUE: Multidetector CT imaging of the chest was performed using the standard protocol during bolus administration of intravenous contrast. Multiplanar CT image reconstructions and MIPs were obtained to evaluate the vascular anatomy. Multidetector CT imaging of the abdomen and pelvis was performed using the standard protocol during bolus administration of intravenous contrast. RADIATION DOSE REDUCTION: This exam was performed according to the departmental dose-optimization program which  includes automated exposure control, adjustment of the mA and/or kV according to patient size and/or use of iterative reconstruction technique. CONTRAST:  OMNIPAQUE IOHEXOL 350 MG/ML SOLN COMPARISON:  CT pelvis 01/23/2023, CT pelvis 12/20/2020 FINDINGS: CTA CHEST FINDINGS Cardiovascular: Satisfactory opacification of the pulmonary arteries to the segmental level. No evidence of pulmonary embolism. The main pulmonary artery is normal in caliber. Normal heart size. No significant pericardial effusion. The thoracic aorta is normal in caliber. No atherosclerotic plaque of the thoracic aorta. No coronary artery calcifications. Mediastinum/Nodes: No enlarged mediastinal, hilar, or axillary lymph nodes. Thyroid gland, trachea, and esophagus demonstrate no significant findings.  hiatal hernia. Lungs/Pleura: No focal  consolidation. No pulmonary nodule. No pulmonary mass. No pleural effusion. No pneumothorax. Musculoskeletal: No chest wall abnormality.  Bilateral trace gynecomastia. No suspicious lytic or blastic osseous lesions. No acute displaced fracture. Review of the MIP images confirms the above findings. CT ABDOMEN and PELVIS FINDINGS Hepatobiliary: No focal liver abnormality. No gallstones, gallbladder wall thickening, or pericholecystic fluid. No biliary dilatation. Pancreas: No focal lesion. Normal pancreatic contour. No surrounding inflammatory changes. No main pancreatic ductal dilatation. Spleen: Normal in size without focal abnormality. Adrenals/Urinary Tract: No adrenal nodule bilaterally. Bilateral kidneys enhance symmetrically. Right renal cortical scarring. Subcentimeter hypodensities are too  to characterize-no further follow-up indicated. Bilateral fluid density lesions likely represent simple renal cysts. Simple renal cysts, in the absence of clinically indicated signs/symptoms, require no independent follow-up. No hydronephrosis. No hydroureter.  No nephroureterolithiasis. The urinary  bladder is unremarkable. Stomach/Bowel: Stomach is within normal limits. No evidence of  bowel wall thickening or dilatation. Irregular left posterior rectal wall thickening. 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 Finding is congruent with presacral soft tissue fat stranding and gas. No organized fluid collection. Appendix appears normal. Vascular/Lymphatic: No abdominal aorta or iliac aneurysm. Mild atherosclerotic plaque of the aorta and its branches. No abdominal, pelvic, or inguinal lymphadenopathy. Reproductive: Prostate is unremarkable. Other: Similar-appearing presacral soft tissue fat stranding and free gas. No intraperitoneal free fluid. No intraperitoneal free gas. No organized fluid collection. Musculoskeletal: No abdominal wall hernia or abnormality. No suspicious lytic or blastic osseous lesions. No acute displaced fracture. No new cortical erosion or destruction. Chronic resorption of the coccyx. Review of the MIP images confirms the above findings. IMPRESSION: 1. No pulmonary embolus. 2. No acute intrathoracic abnormality. 3. No acute intra-abdominal abnormality. 4. Persistent irregular left posterior rectal wall thickening with associated Similar-appearing suprasphincteric left perianal fistula with posterior rectal inflammatory changes and seton in place. Findings are congruent with stable presacral soft tissue fat stranding and gas. No organized fluid collection. Rectal wall thickening likely reactive changes in the setting of an anorectal fistula with however underlying malignancy not excluded. 5. Associated chronic destruction of the coccyx with no new cortical erosion or obstruction. Findings suggestive of chronic osteomyelitis. If concern for acute osteomyelitis, consider MRI. Electronically Signed   By: Tish Frederickson M.D.   On: 04/03/2023 22:04   CT ABDOMEN PELVIS W CONTRAST  Result Date:  04/03/2023 CLINICAL DATA:  crohns disease, coccyx pain, hx of fistula; Pulmonary embolism (PE) suspected, high prob severe dizziness and SOB EXAM: CT ANGIOGRAPHY CHEST CT ABDOMEN AND PELVIS WITH CONTRAST TECHNIQUE: Multidetector CT imaging of the chest was performed using the standard protocol during bolus administration of intravenous contrast. Multiplanar CT image reconstructions and MIPs were obtained to evaluate the vascular anatomy. Multidetector CT imaging of the abdomen and pelvis was performed using the standard protocol during bolus administration of intravenous contrast. RADIATION DOSE REDUCTION: This exam was performed according to the departmental dose-optimization program which includes automated exposure control, adjustment of the mA and/or kV according to patient size and/or use of iterative reconstruction technique. CONTRAST:  OMNIPAQUE IOHEXOL 350 MG/ML SOLN COMPARISON:  CT pelvis 01/23/2023, CT pelvis 12/20/2020 FINDINGS: CTA CHEST FINDINGS Cardiovascular: Satisfactory opacification of the pulmonary arteries to the segmental level. No evidence of pulmonary embolism. The main pulmonary artery is normal in caliber. Normal heart size. No significant pericardial effusion. The thoracic aorta is normal in caliber. No atherosclerotic plaque of the thoracic aorta. No coronary artery calcifications. Mediastinum/Nodes: No enlarged  mediastinal, hilar, or axillary lymph nodes. Thyroid gland, trachea, and esophagus demonstrate no significant findings.  hiatal hernia. Lungs/Pleura: No focal consolidation. No pulmonary nodule. No pulmonary mass. No pleural effusion. No pneumothorax. Musculoskeletal: No chest wall abnormality.  Bilateral trace gynecomastia. No suspicious lytic or blastic osseous lesions. No acute displaced fracture. Review of the MIP images confirms the above findings. CT ABDOMEN and PELVIS FINDINGS Hepatobiliary: No focal liver abnormality. No gallstones, gallbladder wall thickening,  or pericholecystic fluid. No biliary dilatation. Pancreas: No focal lesion. Normal pancreatic contour. No surrounding inflammatory changes. No main pancreatic ductal dilatation. Spleen: Normal in size without focal abnormality. Adrenals/Urinary Tract: No adrenal nodule bilaterally. Bilateral kidneys enhance symmetrically. Right renal cortical scarring. Subcentimeter hypodensities are too  to characterize-no further follow-up indicated. Bilateral fluid density lesions likely represent simple renal cysts. Simple renal cysts, in the absence of clinically indicated signs/symptoms, require no independent follow-up. No hydronephrosis. No hydroureter.  No nephroureterolithiasis. The urinary bladder is unremarkable. Stomach/Bowel: Stomach is within normal limits. No evidence of  bowel wall thickening or dilatation. Irregular left posterior rectal wall thickening. 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 Finding is congruent with presacral soft tissue fat stranding and gas. No organized fluid collection. Appendix appears normal. Vascular/Lymphatic: No abdominal aorta or iliac aneurysm. Mild atherosclerotic plaque of the aorta and its branches. No abdominal, pelvic, or inguinal lymphadenopathy. Reproductive: Prostate is unremarkable. Other: Similar-appearing presacral soft tissue fat stranding and free gas. No intraperitoneal free fluid. No intraperitoneal free gas. No organized fluid collection. Musculoskeletal: No abdominal wall hernia or abnormality. No suspicious lytic or blastic osseous lesions. No acute displaced fracture. No new cortical erosion or destruction. Chronic resorption of the coccyx. Review of the MIP images confirms the above findings. IMPRESSION: 1. No pulmonary embolus. 2. No acute intrathoracic abnormality. 3. No acute intra-abdominal abnormality. 4. Persistent irregular left posterior rectal wall thickening  with associated Similar-appearing suprasphincteric left perianal fistula with posterior rectal inflammatory changes and seton in place. Findings are congruent with stable presacral soft tissue fat stranding and gas. No organized fluid collection. Rectal wall thickening likely reactive changes in the setting of an anorectal fistula with however underlying malignancy not excluded. 5. Associated chronic destruction of the coccyx with no new cortical erosion or obstruction. Findings suggestive of chronic osteomyelitis. If concern for acute osteomyelitis, consider MRI. Electronically Signed   By: Tish Frederickson M.D.   On: 04/03/2023 22:04   CT Head Wo Contrast  Result Date: 04/03/2023 CLINICAL DATA:  Syncope/presyncope, cerebrovascular cause suspected EXAM: CT HEAD WITHOUT CONTRAST TECHNIQUE: Contiguous axial images were obtained from the base of the skull through the vertex without intravenous contrast. RADIATION DOSE REDUCTION: This exam was performed according to the departmental dose-optimization program which includes automated exposure control, adjustment of the mA and/or kV according to patient size and/or use of iterative reconstruction technique. COMPARISON:  CT head 01/04/2020 FINDINGS: Brain: No evidence of large-territorial acute infarction. No parenchymal hemorrhage. No mass lesion. No extra-axial collection. No mass effect or midline shift. No hydrocephalus. Basilar cisterns are patent. Vascular: No hyperdense vessel. Skull: No acute fracture or focal lesion. Sinuses/Orbits: Paranasal sinuses and mastoid air cells are clear. The orbits are unremarkable. Other: None. IMPRESSION: No acute intracranial abnormality. Electronically Signed   By: Tish Frederickson M.D.   On: 04/03/2023 21:49   DG Chest Port 1 View  Result Date: 04/03/2023 CLINICAL DATA:  Shortness of breath and dizziness EXAM: PORTABLE CHEST 1 VIEW COMPARISON:  Chest radiograph dated 07/08/2021 FINDINGS: Normal lung volumes. No focal  consolidations. No pleural effusion or pneumothorax. The heart size and mediastinal contours are within normal limits. No acute osseous abnormality. IMPRESSION: No active disease. Electronically Signed   By: Agustin Cree M.D.   On: 04/03/2023 19:45    Procedures Procedures    Medications Ordered in ED Medications  albuterol (VENTOLIN HFA) 108 (90 Base) MCG/ACT inhaler 2 puff (2 puffs Inhalation Given 04/03/23 2119)  sodium chloride 0.9 % bolus 1,000 mL (1,000 mLs Intravenous Bolus 04/03/23 2114)  morphine (PF) 4 MG/ML injection 4 mg (4 mg Intravenous Given 04/03/23 2115)  ondansetron (ZOFRAN) injection 4 mg (4 mg Intravenous Given 04/03/23 2116)  iohexol (OMNIPAQUE) 350 MG/ML injection 100 mL (100 mLs Intravenous Contrast Given 04/03/23 2128)    ED Course/ Medical Decision Making/ A&P Clinical Course as of 04/03/23 2310  Sun Apr 03, 2023  2141 CT ABDOMEN PELVIS W CONTRAST [MP]    Clinical Course User Index [MP] Petri, Canary Brim, Student-PA                                 Medical Decision Making Patient is a 37 year old, who presents to the ED secondary to 2 complaints.  Coccyx pain for the last 6 months.  I evaluated his buttocks, and anus, no evidence of any kind of anal abscess, but given history of fistulas, and coccygeal osteomyelitis we will obtain a CT abdomen pelvis.  I discussed this with Dr. Silverio Lay, and he does not feel like an MRI is necessary at his osteomyelitis frequently shows up on CT.  We will also evaluate for etiology of the dizziness with the CTA chest, to rule out a PE as well as a head CT.  He has no acute focal deficits however.  Amount and/or Complexity of Data Reviewed Labs: ordered.    Details: Leukocytosis of 12.77k Radiology: ordered.    Details: CTA shows no acute findings, CT abdomen pelvis, shows chronic osteomyelitis, perianal fistula, with inflammatory changesv w/ stable findings Discussion of management or test interpretation with external provider(s): Discussed  with Dr. Silverio Lay, no acute findings noted, however given inflammatory changes to the perianal fistula, we will start him on ciprofloxacin, as well as Flagyl, and have him follow-up with infectious disease and general surgery for his osteomyelitis, chronic, and his fistulas discussed return precautions patient voiced understanding, dizziness and shortness of breath is resolved at this time.  Troponin negative.  Risk Prescription drug management.    Final Clinical Impression(s) / ED Diagnoses Final diagnoses:  Dizziness  Shortness of breath  Chronic osteomyelitis (HCC)  Coccyx pain    Rx / DC Orders ED Discharge Orders          Ordered    ciprofloxacin (CIPRO) 500 MG tablet  Every 12 hours        04/03/23 2232    metroNIDAZOLE (FLAGYL) 500 MG tablet  2 times daily        04/03/23 2232              Pete Pelt, PA 04/03/23 2334    Charlynne Pander, MD 04/06/23 [phone]

## 2023-04-03 NOTE — Discharge Instructions (Addendum)
Please follow-up with your general surgeon and infectious disease.  Chronic osteomyelitis.  I believe that this is likely the cause of your pain.  Return to the ER if you feel like your symptoms are worsening, you become extremely short of breath, or dizzy again.  Your scans show no acute findings, just chronic.  We have placed you on some antibiotics, to help with your osteomyelitis, however please follow-up with your general surgeon and infectious disease doctor.

## 2023-04-04 ENCOUNTER — Encounter (HOSPITAL_COMMUNITY): Payer: Self-pay | Admitting: Medical

## 2023-04-04 MED ORDER — OXYCODONE-ACETAMINOPHEN 5-325 MG PO TABS
1.0000 | ORAL_TABLET | Freq: Once | ORAL | Status: AC
  Administered 2023-04-04: 1 via ORAL
  Filled 2023-04-04: qty 1

## 2023-04-04 NOTE — ED Notes (Signed)
Pt put up for d/c around 2320 but wanted to spreak to provider about pain medicine. Pt notified that his providers had already left for the evening and unfortunately was no longer available. Pt very concerned about his pain and wanted some relief provided prior to d/c. Rees,MD was polite enough to address his concerns. Pt now ready to d/c.

## 2023-04-06 ENCOUNTER — Encounter: Payer: Self-pay | Admitting: Gastroenterology

## 2023-04-06 ENCOUNTER — Ambulatory Visit (AMBULATORY_SURGERY_CENTER): Payer: 59 | Admitting: Gastroenterology

## 2023-04-06 VITALS — BP 130/89 | HR 63 | Temp 98.7°F | Resp 11 | Ht 73.0 in | Wt 195.0 lb

## 2023-04-06 DIAGNOSIS — K603 Anal fistula: Secondary | ICD-10-CM

## 2023-04-06 DIAGNOSIS — K50113 Crohn's disease of large intestine with fistula: Secondary | ICD-10-CM | POA: Diagnosis not present

## 2023-04-06 DIAGNOSIS — N183 Chronic kidney disease, stage 3 unspecified: Secondary | ICD-10-CM | POA: Diagnosis not present

## 2023-04-06 MED ORDER — SODIUM CHLORIDE 0.9 % IV SOLN: 500.0000 mL | Freq: Once | INTRAVENOUS | Status: DC

## 2023-04-06 NOTE — Op Note (Signed)
[facility] Patient Name: Shawn [name] Procedure Date: 04/06/2023 11:13 AM MRN: [id number] Endoscopist: Meryl Dare , MD, [phone] Age: 37 Referring MD:  Date of Birth: [birth year] Gender: Male Account #: [id number] Procedure:                Colonoscopy Indications:              Determine extent and severity of inflammatory bowel                            disease, Crohn's disease Medicines:                Monitored Anesthesia Care Procedure:                Pre-Anesthesia Assessment:                           - Prior to the procedure, a History and Physical                            was performed, and patient medications and                            allergies were reviewed. The patient's tolerance of                            previous anesthesia was also reviewed. The risks                            and benefits of the procedure and the sedation                            options and risks were discussed with the patient.                            All questions were answered, and informed consent                            was obtained. Prior Anticoagulants: The patient has                            taken no anticoagulant or antiplatelet agents. ASA                            Grade Assessment: II - A patient with mild systemic                            disease. After reviewing the risks and benefits,                            the patient was deemed in satisfactory condition to                            undergo the procedure.  After obtaining informed consent, the colonoscope                            was passed under direct vision. Throughout the                            procedure, the patient's blood pressure, pulse, and                            oxygen saturations were monitored continuously. The                            CF [id number] #[id number] was introduced through the anus                            and advanced to the the  terminal ileum, with                            identification of the appendiceal orifice and IC                            valve. The ileocecal valve, appendiceal orifice,                            and rectum were photographed. The quality of the                            bowel preparation was good. The colonoscopy was                            performed without difficulty. The patient tolerated                            the procedure well. Scope In: 11:24:45 AM Scope Out: 11:40:41 AM Scope Withdrawal Time: 0 hours 10 minutes 37 seconds  Total Procedure Duration: 0 hours 15 minutes 56 seconds  Findings:                 The perianal exam was abnormal. Fistula with seton                            in place.                           The terminal ileum appeared normal. Biopsies were                            taken with a cold forceps for histology.                           Inflammation characterized by granularity and                            pseudopolyps was found in a continuous and ~ 1/2  circumferential pattern from the anus to the                            rectum. The rest colon was spared. The inflammation                            was moderate in severity, and when compared to                            previous examinations, the findings are improved.                            Biopsies were taken with a cold forceps for                            histology.                           The exam was otherwise without abnormality on                            direct and retroflexion views. Biopsies were taken                            with a cold forceps for histology. Complications:            No immediate complications. Estimated blood loss:                            None. Estimated Blood Loss:     Estimated blood loss: none. Impression:               - Abnormal perianal exam fistula with seton in                            place.                            - The examined portion of the ileum was normal.                            Biopsied.                           - Crohn's disease with colonic involvement.                            Inflammation was found from the anus to the rectum.                            This was moderate in severity, improved compared to                            previous examinations. Biopsied.                           -  The examination was otherwise normal on direct                            and retroflexion views. Recommendation:           - Repeat colonoscopy after studies are complete for                            surveillance based on pathology results.                           - Patient has a contact number available for                            emergencies. The signs and symptoms of potential                            delayed complications were discussed with the                            patient. Return to normal activities tomorrow.                            Written discharge instructions were provided to the                            patient.                           - Resume previous diet.                           - Continue present medications.                           - Await pathology results. Meryl Dare, MD 04/06/2023 11:49:00 AM This report has been signed electronically.

## 2023-04-06 NOTE — Patient Instructions (Signed)
Resume previous diet and medications. Awaiting pathology results. Continue current medication regimen.  YOU HAD AN ENDOSCOPIC PROCEDURE TODAY AT THE [facility]:   Refer to the procedure report that was given to you for any specific questions about what was found during the examination.  If the procedure report does not answer your questions, please call your gastroenterologist to clarify.  If you requested that your care partner not be given the details of your procedure findings, then the procedure report has been included in a sealed envelope for you to review at your convenience later.  YOU SHOULD EXPECT: Some feelings of bloating in the abdomen. Passage of more gas than usual.  Walking can help get rid of the air that was put into your GI tract during the procedure and reduce the bloating. If you had a lower endoscopy (such as a colonoscopy or flexible sigmoidoscopy) you may notice spotting of blood in your stool or on the toilet paper. If you underwent a bowel prep for your procedure, you may not have a normal bowel movement for a few days.  Please Note:  You might notice some irritation and congestion in your nose or some drainage.  This is from the oxygen used during your procedure.  There is no need for concern and it should clear up in a day or so.  SYMPTOMS TO REPORT IMMEDIATELY:  Following lower endoscopy (colonoscopy or flexible sigmoidoscopy):  Excessive amounts of blood in the stool  Significant tenderness or worsening of abdominal pains  Swelling of the abdomen that is new, acute  Fever of 100F or higher  For urgent or emergent issues, a gastroenterologist can be reached at any hour by calling [phone]. Do not use MyChart messaging for urgent concerns.    DIET:  We do recommend a small meal at first, but then you may proceed to your regular diet.  Drink plenty of fluids but you should avoid alcoholic beverages for 24 hours.  ACTIVITY:  You should plan to take  it easy for the rest of today and you should NOT DRIVE or use heavy machinery until tomorrow (because of the sedation medicines used during the test).    FOLLOW UP: Our staff will call the number listed on your records the next business day following your procedure.  We will call around 7:15- 8:00 am to check on you and address any questions or concerns that you may have regarding the information given to you following your procedure. If we do not reach you, we will leave a message.     If any biopsies were taken you will be contacted by phone or by letter within the next 1-3 weeks.  Please call us at [phone] if you have not heard about the biopsies in 3 weeks.    SIGNATURES/CONFIDENTIALITY: You and/or your care partner have signed paperwork which will be entered into your electronic medical record.  These signatures attest to the fact that that the information above on your After Visit Summary has been reviewed and is understood.  Full responsibility of the confidentiality of this discharge information lies with you and/or your care-partner.

## 2023-04-06 NOTE — Progress Notes (Signed)
Called to room to assist during endoscopic procedure.  Patient ID and intended procedure confirmed with present staff. Received instructions for my participation in the procedure from the performing physician.  

## 2023-04-06 NOTE — Progress Notes (Signed)
Report to PACU, RN, vss, BBS= Clear.  

## 2023-04-06 NOTE — Progress Notes (Signed)
History & Physical  Primary Care Physician:  Hoy Register, MD Primary Gastroenterologist: Claudette Head, MD  Impression / Plan:  Crohn's proctitis for colonoscopy.  CHIEF COMPLAINT:  Crohn's proctitis   HPI: Shawn [name] is a 37 y.o. male with Crohn's proctitis for colonoscopy.    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   PO B12 initiated 12/31/20   Anxiety    Arthritis    Chronic kidney disease    Crohn's colitis (HCC)    Dilated cardiomyopathy (HCC) 05/18/2013   By catheterization 2014    Fistula    GERD (gastroesophageal reflux disease)    Hypertension    Neuromuscular disorder (HCC)    Osteomyelitis (HCC)    tailbone    Past Surgical History:  Procedure Laterality Date   AV FISTULA REPAIR  07/07/2021   BIOPSY  01/01/2021   Procedure: BIOPSY;  Surgeon: Meryl Dare, MD;  Location: [facility] ENDOSCOPY;  Service: Endoscopy;;   COLONOSCOPY WITH PROPOFOL N/A 01/01/2021   Procedure: COLONOSCOPY WITH PROPOFOL;  Surgeon: Meryl Dare, MD;  Location: [facility] ENDOSCOPY;  Service: Endoscopy;  Laterality: N/A;   INCISION AND DRAINAGE ABSCESS N/A 07/07/2021   Procedure: MODIFIED HANLEY PROCEDURE, DRAINAGE WITH SETON PLACEMENT;  Surgeon: Karie Soda, MD;  Location: [facility] ORS;  Service: General;  Laterality: N/A;   LEFT AND RIGHT HEART CATHETERIZATION WITH CORONARY ANGIOGRAM N/A 11/21/2012   Procedure: LEFT AND RIGHT HEART CATHETERIZATION WITH CORONARY ANGIOGRAM;  Surgeon: Ricki Rodriguez, MD;  Location: [facility] CATH LAB;  Service: Cardiovascular;  Laterality: N/A;   MANDIBLE FRACTURE SURGERY  08/23/2004   PLACEMENT OF SETON N/A 07/07/2021   Procedure: PLACEMENT OF SETON;  Surgeon: Karie Soda, MD;  Location: [facility] ORS;  Service: General;  Laterality: N/A;   PLACEMENT OF SETON N/A 01/15/2022   Procedure: PLACEMENT OF SETON;  Surgeon: Karie Soda, MD;  Location: [facility] ORS;  Service: General;  Laterality: N/A;    RECTAL EXAM UNDER ANESTHESIA N/A 01/15/2022   Procedure: RECTAL EXAM UNDER ANESTHESIA;  Surgeon: Karie Soda, MD;  Location: [facility] ORS;  Service: General;  Laterality: N/A;  TRANSRECTAL DRAINAGE REPLACEMENT OF SETON EXCISION OF PERINEAL SINUS TRACT ANORECTAL EXAM UNDER ANESTHESIA    Prior to Admission medications   Medication Sig Start Date End Date Taking? Authorizing Provider  multivitamin-iron-minerals-folic acid (CENTRUM) chewable tablet Chew 1 tablet by mouth daily. 07/10/21  Yes Karie Soda, MD  tiZANidine (ZANAFLEX) 4 MG tablet Take 1 tablet (4 mg total) by mouth every 8 (eight) hours as needed. 02/18/23  Yes Glyn Ade, MD  vitamin B-12 1000 MCG tablet Take 1 tablet (1,000 mcg total) by mouth daily. 07/10/21  Yes Karie Soda, MD  acetaminophen (TYLENOL) 500 MG tablet Take 2 tablets (1,000 mg total) by mouth every 8 (eight) hours as needed for moderate pain. 02/18/23   Glyn Ade, MD  lidocaine (LIDODERM) 5 % Place 1 patch onto the skin daily. Remove & Discard patch within 12 hours or as directed by MD Patient not taking: Reported on 03/23/2023 02/18/23   Glyn Ade, MD  metroNIDAZOLE (FLAGYL) 500 MG tablet Take 1 tablet (500 mg total) by mouth 2 (two) times daily. 04/03/23   Small, Brooke L, PA  ustekinumab (STELARA) 90 MG/ML SOSY injection Inject 1 mL (90 mg total) into the skin every 8 (eight) weeks. no further refills unless appt is kept 01/19/23   Meryl Dare, MD    Current Outpatient Medications  Medication Sig Dispense Refill   multivitamin-iron-minerals-folic acid (CENTRUM) chewable tablet Chew 1 tablet by mouth daily. 30 tablet 2   tiZANidine (ZANAFLEX) 4 MG tablet Take 1 tablet (4 mg total) by mouth every 8 (eight) hours as needed. 60 tablet 1   vitamin B-12 1000 MCG tablet Take 1 tablet (1,000 mcg total) by mouth daily. 30 tablet 2   acetaminophen (TYLENOL) 500 MG tablet Take 2 tablets (1,000 mg total) by mouth every 8 (eight) hours as needed for  moderate pain. 30 tablet 0   lidocaine (LIDODERM) 5 % Place 1 patch onto the skin daily. Remove & Discard patch within 12 hours or as directed by MD (Patient not taking: Reported on 03/23/2023) 30 patch 0   metroNIDAZOLE (FLAGYL) 500 MG tablet Take 1 tablet (500 mg total) by mouth 2 (two) times daily. 14 tablet 0   ustekinumab (STELARA) 90 MG/ML SOSY injection Inject 1 mL (90 mg total) into the skin every 8 (eight) weeks. no further refills unless appt is kept 1 mL 1   Current Facility-Administered Medications  Medication Dose Route Frequency Provider Last Rate Last Admin   0.9 %  sodium chloride infusion  500 mL Intravenous Once Meryl Dare, MD       Facility-Administered Medications Ordered in Other Visits  Medication Dose Route Frequency Provider Last Rate Last Admin   bupivacaine liposome (EXPAREL) 1.3 % injection 266 mg  20 mL Infiltration Once Karie Soda, MD       Chlorhexidine Gluconate Cloth 2 % PADS 6 each  6 each Topical Once Karie Soda, MD       And   Chlorhexidine Gluconate Cloth 2 % PADS 6 each  6 each Topical Once Karie Soda, MD       feeding supplement (ENSURE PRE-SURGERY) liquid 296 mL  296 mL Oral Once Karie Soda, MD        Allergies as of 04/06/2023 - Review Complete 04/06/2023  Allergen Reaction Noted   Shrimp [shellfish allergy] Shortness Of Breath 05/16/2013   Nsaids Other (See Comments) 07/06/2021    Family History  Problem Relation Age of Onset   Diabetes Mother    Diabetes Sister    Colon cancer Neg Hx    Stomach cancer Neg Hx    Esophageal cancer Neg Hx    Pancreatic disease Neg Hx    Colon polyps Neg Hx    Rectal cancer Neg Hx     Social History   Socioeconomic History   Marital status: Single    Spouse name: Not on file   Number of children: Not on file   Years of education: Not on file   Highest education level: Associate degree: occupational, Scientist, product/process development, or vocational program  Occupational History   Not on file  Tobacco Use    Smoking status: Former    Current packs/day: 0.25    Types: Cigarettes   Smokeless tobacco: Never  Vaping Use   Vaping status: Never Used  Substance and Sexual Activity   Alcohol use: Not Currently    Comment: occ   Drug use: No   Sexual activity: Not on file  Other Topics Concern   Not on file  Social History Narrative   Not on file   Social Determinants of Health   Financial Resource Strain: High Risk (01/03/2023)   Overall Financial Resource Strain (CARDIA)    Difficulty of Paying Living Expenses: Hard  Food Insecurity: Food Insecurity Present (01/03/2023)   Hunger Vital Sign    Worried  About Running Out of Food in the Last Year: Sometimes true    Ran Out of Food in the Last Year: Sometimes true  Transportation Needs: No Transportation Needs (01/03/2023)   PRAPARE - Administrator, Civil Service (Medical): No    Lack of Transportation (Non-Medical): No  Physical Activity: Sufficiently Active (01/03/2023)   Exercise Vital Sign    Days of Exercise per Week: 7 days    Minutes of Exercise per Session: 90 min  Stress: Stress Concern Present (01/03/2023)   [employer] of Occupational Health - Occupational Stress Questionnaire    Feeling of Stress : Very much  Social Connections: Socially Isolated (01/03/2023)   Social Connection and Isolation Panel [NHANES]    Frequency of Communication with Friends and Family: Three times a week    Frequency of Social Gatherings with Friends and Family: Never    Attends Religious Services: Never    Database administrator or Organizations: No    Attends Engineer, structural: Not on file    Marital Status: Never married  Intimate Partner Violence: Unknown (11/26/2021)   Received from [facility]   HITS    Physically Hurt: Not on file    Insult or Talk Down To: Not on file    Threaten Physical Harm: Not on file    Scream or Curse: Not on file    Review of Systems:  All systems reviewed were negative except where  noted in HPI.   Physical Exam:  General:  Alert, well-developed, in NAD Head:  Normocephalic and atraumatic. Eyes:  Sclera clear, no icterus.   Conjunctiva pink. Ears:  Normal auditory acuity. Mouth:  No deformity or lesions.  Neck:  Supple; no masses. Lungs:  Clear throughout to auscultation.   No wheezes, crackles, or rhonchi.  Heart:  Regular rate and rhythm; no murmurs. Abdomen:  Soft, nondistended, nontender. No masses, hepatomegaly. No palpable masses.  Normal bowel sounds.    Rectal:  Deferred   Msk:  Symmetrical without gross deformities. Extremities:  Without edema. Neurologic:  Alert and  oriented x 4; grossly normal neurologically. Skin:  Intact without significant lesions or rashes. Psych:  Alert and cooperative. Normal mood and affect.  Lab Results: Recent Labs    04/03/23 1950  WBC 12.7*  HGB 11.8*  HCT 35.8*  PLT 327   BMET Recent Labs    04/03/23 1950  NA 135  K 4.0  CL 106  CO2 20*  GLUCOSE 98  BUN 23*  CREATININE 1.58*  CALCIUM 8.6*   LFT Recent Labs    04/03/23 1950  PROT 8.4*  ALBUMIN 3.8  AST 21  ALT 27  ALKPHOS 123  BILITOT 0.6     T. Russella Dar  04/06/2023, 11:13 AM See Loretha Stapler, [facility], to contact our on call provider

## 2023-04-06 NOTE — Progress Notes (Signed)
Pt's states no medical or surgical changes since previsit or office visit. 

## 2023-04-07 ENCOUNTER — Other Ambulatory Visit: Payer: Self-pay

## 2023-04-07 ENCOUNTER — Ambulatory Visit (INDEPENDENT_AMBULATORY_CARE_PROVIDER_SITE_OTHER): Payer: 59 | Admitting: Internal Medicine

## 2023-04-07 ENCOUNTER — Telehealth: Payer: Self-pay | Admitting: *Deleted

## 2023-04-07 ENCOUNTER — Encounter: Payer: Self-pay | Admitting: Internal Medicine

## 2023-04-07 VITALS — BP 136/86 | HR 75 | Temp 98.1°F | Resp 16 | Wt 196.0 lb

## 2023-04-07 DIAGNOSIS — M4628 Osteomyelitis of vertebra, sacral and sacrococcygeal region: Secondary | ICD-10-CM | POA: Diagnosis not present

## 2023-04-07 NOTE — Telephone Encounter (Signed)
No answer on follow up call. Unable to leave voice message

## 2023-04-07 NOTE — Assessment & Plan Note (Signed)
History of osteomyelitis and here with complaint of pain.  No signs of ongoing infection on CT scan.  I recommended follow up with his PCP. No indication for any ID intervention. Follow up as needed  I have personally spent 20 minutes involved in face-to-face and non-face-to-face activities for this patient on the day of the visit. Professional time spent includes the following activities: Preparing to see the patient (review of tests), Obtaining and/or reviewing separately obtained history (admission/discharge record), Performing a medically appropriate examination and/or evaluation , Ordering medications/tests/procedures, referring and communicating with other health care professionals, Documenting clinical information in the EMR, Independently interpreting results (not separately reported), Communicating results to the patient/family/caregiver, Counseling and educating the patient/family/caregiver and Care coordination (not separately reported).

## 2023-04-07 NOTE — Progress Notes (Signed)
   Subjective:    Patient ID: Shawn [name], male    DOB: [birth year], 37 y.o.   MRN: [id number]  HPI [place] is here for follow up from an ED visit.   He has a history of coccyx osteomyelitis s/p resection and was previously treated in 2022.  He reports having constant pain since then and went to the ED 8/11 for evaluation.  CT scan of pelvis with no new infectious concerns.  He was referred back to me.  No fever, no chills.  Pain is essentially unchanged from his initial infection.     Review of Systems  Constitutional:  Negative for chills, fatigue and fever.  Respiratory:  Positive for shortness of breath.        Objective:   Physical Exam Eyes:     General: No scleral icterus. Pulmonary:     Effort: Pulmonary effort is normal.  Neurological:     Mental Status: He is alert.           Assessment & Plan:

## 2023-04-19 ENCOUNTER — Encounter: Payer: Self-pay | Admitting: Gastroenterology

## 2023-08-12 ENCOUNTER — Encounter: Payer: Self-pay | Admitting: Gastroenterology

## 2023-08-12 ENCOUNTER — Emergency Department (HOSPITAL_COMMUNITY)
Admission: EM | Admit: 2023-08-12 | Discharge: 2023-08-12 | Discharge status: Against Medical Advice | Payer: Medicaid Other | Attending: Emergency Medicine | Admitting: Emergency Medicine

## 2023-08-12 ENCOUNTER — Encounter (HOSPITAL_COMMUNITY): Payer: Self-pay

## 2023-08-12 ENCOUNTER — Other Ambulatory Visit: Payer: Self-pay

## 2023-08-12 DIAGNOSIS — Z5321 Procedure and treatment not carried out due to patient leaving prior to being seen by health care provider: Secondary | ICD-10-CM | POA: Insufficient documentation

## 2023-08-12 DIAGNOSIS — M545 Low back pain, unspecified: Secondary | ICD-10-CM | POA: Insufficient documentation

## 2023-08-12 NOTE — ED Triage Notes (Addendum)
Pt presenting today complaining of lower back pain that began yesterday. Pt says that he rolled over while sleeping and the pain began, denies ever having pain like this before. Denies any N/V/D. Pt did try tylenol without relief.

## 2023-08-22 ENCOUNTER — Other Ambulatory Visit: Payer: Self-pay

## 2023-08-22 ENCOUNTER — Encounter (HOSPITAL_COMMUNITY): Payer: Self-pay

## 2023-08-22 ENCOUNTER — Emergency Department (HOSPITAL_COMMUNITY)
Admission: EM | Admit: 2023-08-22 | Discharge: 2023-08-22 | Disposition: A | Discharge status: Home / Self Care | Payer: Medicaid Other | Attending: Emergency Medicine | Admitting: Emergency Medicine

## 2023-08-22 ENCOUNTER — Emergency Department (HOSPITAL_COMMUNITY): Payer: Medicaid Other

## 2023-08-22 DIAGNOSIS — M5432 Sciatica, left side: Secondary | ICD-10-CM | POA: Diagnosis not present

## 2023-08-22 DIAGNOSIS — M5441 Lumbago with sciatica, right side: Secondary | ICD-10-CM | POA: Diagnosis not present

## 2023-08-22 DIAGNOSIS — M5442 Lumbago with sciatica, left side: Secondary | ICD-10-CM | POA: Diagnosis not present

## 2023-08-22 DIAGNOSIS — R1032 Left lower quadrant pain: Secondary | ICD-10-CM | POA: Insufficient documentation

## 2023-08-22 DIAGNOSIS — M549 Dorsalgia, unspecified: Secondary | ICD-10-CM | POA: Diagnosis present

## 2023-08-22 DIAGNOSIS — M5431 Sciatica, right side: Secondary | ICD-10-CM | POA: Diagnosis not present

## 2023-08-22 LAB — CBC WITH DIFFERENTIAL/PLATELET
Abs Immature Granulocytes: 0.1 10*3/uL — ABNORMAL HIGH (ref 0.00–0.07)
Basophils Absolute: 0.1 10*3/uL (ref 0.0–0.1)
Basophils Relative: 1 %
Eosinophils Absolute: 0.5 10*3/uL (ref 0.0–0.5)
Eosinophils Relative: 3 %
HCT: 33.4 % — ABNORMAL LOW (ref 39.0–52.0)
Hemoglobin: 11 g/dL — ABNORMAL LOW (ref 13.0–17.0)
Immature Granulocytes: 1 %
Lymphocytes Relative: 22 %
Lymphs Abs: 2.9 10*3/uL (ref 0.7–4.0)
MCH: 30 pg (ref 26.0–34.0)
MCHC: 32.9 g/dL (ref 30.0–36.0)
MCV: 91 fL (ref 80.0–100.0)
Monocytes Absolute: 0.7 10*3/uL (ref 0.1–1.0)
Monocytes Relative: 5 %
Neutro Abs: 9.1 10*3/uL — ABNORMAL HIGH (ref 1.7–7.7)
Neutrophils Relative %: 68 %
Platelets: 505 10*3/uL — ABNORMAL HIGH (ref 150–400)
RBC: 3.67 MIL/uL — ABNORMAL LOW (ref 4.22–5.81)
RDW: 16.4 % — ABNORMAL HIGH (ref 11.5–15.5)
WBC: 13.3 10*3/uL — ABNORMAL HIGH (ref 4.0–10.5)
nRBC: 0 % (ref 0.0–0.2)

## 2023-08-22 LAB — I-STAT CHEM 8, ED
BUN: 23 mg/dL — ABNORMAL HIGH (ref 6–20)
Calcium, Ion: 1.15 mmol/L (ref 1.15–1.40)
Chloride: 111 mmol/L (ref 98–111)
Creatinine, Ser: 1.6 mg/dL — ABNORMAL HIGH (ref 0.61–1.24)
Glucose, Bld: 95 mg/dL (ref 70–99)
HCT: 32 % — ABNORMAL LOW (ref 39.0–52.0)
Hemoglobin: 10.9 g/dL — ABNORMAL LOW (ref 13.0–17.0)
Potassium: 4.3 mmol/L (ref 3.5–5.1)
Sodium: 140 mmol/L (ref 135–145)
TCO2: 21 mmol/L — ABNORMAL LOW (ref 22–32)

## 2023-08-22 LAB — BASIC METABOLIC PANEL
Anion gap: 7 (ref 5–15)
BUN: 21 mg/dL — ABNORMAL HIGH (ref 6–20)
CO2: 18 mmol/L — ABNORMAL LOW (ref 22–32)
Calcium: 8.8 mg/dL — ABNORMAL LOW (ref 8.9–10.3)
Chloride: 110 mmol/L (ref 98–111)
Creatinine, Ser: 1.56 mg/dL — ABNORMAL HIGH (ref 0.61–1.24)
GFR, Estimated: 58 mL/min — ABNORMAL LOW (ref >60)
Glucose, Bld: 99 mg/dL (ref 70–99)
Potassium: 4.1 mmol/L (ref 3.5–5.1)
Sodium: 135 mmol/L (ref 135–145)

## 2023-08-22 MED ORDER — OXYCODONE-ACETAMINOPHEN 5-325 MG PO TABS: 1.0000 | ORAL_TABLET | Freq: Four times a day (QID) | ORAL | 0 refills | Status: DC | PRN

## 2023-08-22 MED ORDER — ONDANSETRON HCL 4 MG/2ML IJ SOLN
4.0000 mg | Freq: Once | INTRAMUSCULAR | Status: AC
  Administered 2023-08-22: 4 mg via INTRAVENOUS
  Filled 2023-08-22: qty 2

## 2023-08-22 MED ORDER — IOHEXOL 350 MG/ML SOLN
65.0000 mL | Freq: Once | INTRAVENOUS | Status: AC | PRN
  Administered 2023-08-22: 65 mL via INTRAVENOUS

## 2023-08-22 MED ORDER — OXYCODONE-ACETAMINOPHEN 5-325 MG PO TABS
2.0000 | ORAL_TABLET | Freq: Once | ORAL | Status: AC
  Administered 2023-08-22: 2 via ORAL
  Filled 2023-08-22: qty 2

## 2023-08-22 NOTE — ED Provider Notes (Signed)
EMERGENCY DEPARTMENT AT [facility] Provider Note   CSN: [id number] Arrival date & time: 08/22/23  8469     History  Chief Complaint  Patient presents with   Back Pain    Shawn [name] is a 37 y.o. male.  Patient is a 37 year old male who presents with back pain.  She has a history of Crohn's disease and has perianal fistula.  He is followed by Dr. Michaell Cowing for this.  He has a seton implanted and that.  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  He has some chronic pain in his coccyx area.  He says now the pain is going over to his left back.  At times it radiates down both of his legs.  He denies any loss of bowel or bladder function.  He denies any numbness or weakness in the legs.  He does have some left side abdominal pain which is new.  He denies any change in his stools.  He does report some constipation.  No known fevers.       Home Medications Prior to Admission medications   Medication Sig Start Date End Date Taking? Authorizing Provider  oxyCODONE-acetaminophen (PERCOCET/ROXICET) 5-325 MG tablet Take 1-2 tablets by mouth every 6 (six) hours as needed for severe pain (pain score 7-10). 08/22/23  Yes Rolan Bucco, MD  acetaminophen (TYLENOL) 500 MG tablet Take 2 tablets (1,000 mg total) by mouth every 8 (eight) hours as needed for moderate pain. 02/18/23   Glyn Ade, MD  lidocaine (LIDODERM) 5 % Place 1 patch onto the skin daily. Remove & Discard patch within 12 hours or as directed by MD 02/18/23   Glyn Ade, MD  metroNIDAZOLE (FLAGYL) 500 MG tablet Take 1 tablet (500 mg total) by mouth 2 (two) times daily. 04/03/23   Small, Brooke L, PA  multivitamin-iron-minerals-folic acid (CENTRUM) chewable tablet Chew 1 tablet by mouth daily. 07/10/21   Karie Soda, MD  tiZANidine (ZANAFLEX) 4 MG tablet Take 1 tablet (4 mg total) by mouth every 8 (eight) hours as needed. 02/18/23   Glyn Ade, MD   ustekinumab (STELARA) 90 MG/ML SOSY injection Inject 1 mL (90 mg total) into the skin every 8 (eight) weeks. no further refills unless appt is kept 01/19/23   Meryl Dare, MD  vitamin B-12 1000 MCG tablet Take 1 tablet (1,000 mcg total) by mouth daily. 07/10/21   Karie Soda, MD      Allergies    Shrimp [shellfish allergy] and Nsaids    Review of Systems   Review of Systems  Constitutional:  Negative for chills, diaphoresis, fatigue and fever.  HENT:  Negative for congestion, rhinorrhea and sneezing.   Eyes: Negative.   Respiratory:  Negative for cough, chest tightness and shortness of breath.   Cardiovascular:  Negative for chest pain and leg swelling.  Gastrointestinal:  Positive for abdominal pain. Negative for blood in stool, diarrhea, nausea and vomiting.  Genitourinary:  Negative for difficulty urinating, flank pain, frequency and hematuria.  Musculoskeletal:  Positive for back pain. Negative for arthralgias.  Skin:  Negative for rash.  Neurological:  Negative for dizziness, speech difficulty, weakness, numbness and headaches.    Physical Exam Updated Vital Signs BP 121/85 (BP Location: Left Arm)   Pulse 64   Temp 98.5 F (36.9 C) (Oral)   Resp 15   SpO2 99%  Physical Exam Constitutional:      Appearance: He is well-developed.  HENT:  Head: Normocephalic and atraumatic.  Eyes:     Pupils: Pupils are equal, round, and reactive to light.  Cardiovascular:     Rate and Rhythm: Normal rate and regular rhythm.     Heart sounds: Normal heart sounds.  Pulmonary:     Effort: Pulmonary effort is normal. No respiratory distress.     Breath sounds: Normal breath sounds. No wheezing or rales.  Chest:     Chest wall: No tenderness.  Abdominal:     General: Bowel sounds are normal.     Palpations: Abdomen is soft.     Tenderness: There is abdominal tenderness (Left lower abdomen). There is no guarding or rebound.  Genitourinary:    Comments: Patient does have some  purulent drainage from his rectal area, he says this is chronic and unchanged, he has some generalized tenderness to his rectal area which she also says is chronic. Musculoskeletal:        General: Normal range of motion.     Cervical back: Normal range of motion and neck supple.  Lymphadenopathy:     Cervical: No cervical adenopathy.  Skin:    General: Skin is warm and dry.     Findings: No rash.  Neurological:     Mental Status: He is alert and oriented to person, place, and time.     Comments: Motor 5 out of 5 all extremities, sensation grossly intact to light touch all extremities, pedal pulses are intact, negative straight leg raise bilaterally     ED Results / Procedures / Treatments   Labs (all labs ordered are listed, but only abnormal results are displayed) Labs Reviewed  BASIC METABOLIC PANEL - Abnormal; Notable for the following components:      Result Value   CO2 18 (*)    BUN 21 (*)    Creatinine, Ser 1.56 (*)    Calcium 8.8 (*)    GFR, Estimated 58 (*)    All other components within normal limits  CBC WITH DIFFERENTIAL/PLATELET - Abnormal; Notable for the following components:   WBC 13.3 (*)    RBC 3.67 (*)    Hemoglobin 11.0 (*)    HCT 33.4 (*)    RDW 16.4 (*)    Platelets 505 (*)    Neutro Abs 9.1 (*)    Abs Immature Granulocytes 0.10 (*)    All other components within normal limits  I-STAT CHEM 8, ED - Abnormal; Notable for the following components:   BUN 23 (*)    Creatinine, Ser 1.60 (*)    TCO2 21 (*)    Hemoglobin 10.9 (*)    HCT 32.0 (*)    All other components within normal limits    EKG None  Radiology CT ABDOMEN PELVIS W CONTRAST Result Date: 08/22/2023 CLINICAL DATA:  Pt c.o left lower back pain with tingling in both legs since last week. LLQ abdominal pain also sacral pain, hx of osteo of coccyx EXAM: CT ABDOMEN AND PELVIS WITH CONTRAST TECHNIQUE: Multidetector CT imaging of the abdomen and pelvis was performed using the standard protocol  following bolus administration of intravenous contrast. RADIATION DOSE REDUCTION: This exam was performed according to the departmental dose-optimization program which includes automated exposure control, adjustment of the mA and/or kV according to patient size and/or use of iterative reconstruction technique. CONTRAST:  65mL OMNIPAQUE IOHEXOL 350 MG/ML SOLN COMPARISON:  CT AP, 04/03/2023 and 01/23/2023. FINDINGS: Lower chest: No acute abnormality. Hepatobiliary: No focal liver abnormality. No gallstones, gallbladder wall thickening, or biliary  dilatation. Pancreas: No pancreatic ductal dilatation or surrounding inflammatory changes. Spleen: Normal in size without focal abnormality. Adrenals/Urinary Tract: Adrenal glands are unremarkable. BILATERAL renal cystic lesions, largest measuring 2.5 cm RIGHT and 3.8 cm LEFT. No dedicated follow-up is indicated. Additional sub-1 cm hypodensities are too small to characterize though likely additional cysts. Kidneys are otherwise normal, without renal calculi or hydronephrosis. Bladder is unremarkable. Stomach/Bowel: Stomach is within normal limits. Appendix appears normal. Nonobstructed small bowel. Nondilated colon. Similar appearance of drain seton at the anorectal verge. Decreased rectal mucosal thickening with mild residual perirectal stranding. LEFT-sided air and fluid tracking along the rectal wall, consistent with a persistent fistula-in-ano. See key image Vascular/Lymphatic: No significant vascular findings are present. No enlarged abdominal or pelvic lymph nodes. Reproductive: Prostate is unremarkable. Other: Small fat-containing RIGHT inguinal hernia versus cord lipoma. No abdominopelvic ascites. Musculoskeletal: No acute or significant osseous findings. IMPRESSION: Since CT AP dated 04/03/2023; 1. Decreased rectal mucosal thickening with mild residual peri-rectal stranding. 2. Small, LEFT-sided persistent anorectal fistula.  See key image. No focal drainable  collection or abscess. Electronically Signed   By: Roanna Banning M.D.   On: 08/22/2023 12:51    Procedures Procedures    Medications Ordered in ED Medications  oxyCODONE-acetaminophen (PERCOCET/ROXICET) 5-325 MG per tablet 2 tablet (2 tablets Oral Given 08/22/23 1045)  ondansetron (ZOFRAN) injection 4 mg (4 mg Intravenous Given 08/22/23 1045)  iohexol (OMNIPAQUE) 350 MG/ML injection 65 mL (65 mLs Intravenous Contrast Given 08/22/23 1203)    ED Course/ Medical Decision Making/ A&P                                 Medical Decision Making Amount and/or Complexity of Data Reviewed Labs: ordered. Radiology: ordered.  Risk Prescription drug management.   Patient is a 37 year old who presents with low back pain.  He has some radicular symptoms down both of his legs.  He does not have any weakness or numbness in his legs.  No signs of cauda equina.  No loss of bowel or bladder function.  No fever.  However he does have a complicated history with Crohn's disease and a prior perirectal fistula/osteomyelitis of his coccyx.  He has chronic pain in his low back over his coccyx area in his left abdomen.  He reported that the left abdominal pain was new.  Therefore CT scan was performed.  This actually shows improved symptoms compared to prior scans.  Improved appearance of the chronic inflammation in his perirectal area/fistula.  His labs are reassuring.  I suspect his back pain is musculoskeletal in nature.  He is somewhat limiting as to far as what medications we can use to treat his back pain.  Will give him a short course of pain medication and refer him to follow-up with orthopedics.  Did advise him the importance of following up with a primary care doctor as well to manage his overall care.  Return precautions were given.   Final Clinical Impression(s) / ED Diagnoses Final diagnoses:  Bilateral sciatica    Rx / DC Orders ED Discharge Orders          Ordered    oxyCODONE-acetaminophen  (PERCOCET/ROXICET) 5-325 MG tablet  Every 6 hours PRN        08/22/23 1346              Rolan Bucco, MD 08/22/23 1352

## 2023-08-22 NOTE — ED Notes (Signed)
Dr. Fredderick Phenix at bedside for rectal exam.

## 2023-08-22 NOTE — ED Triage Notes (Signed)
Pt c.o left lower back pain with tingling in both legs since last week. Pt denies any fall or injury

## 2023-08-22 NOTE — ED Notes (Signed)
Pt returned from CT scan, denies nausea or pain at this time.

## 2023-08-26 ENCOUNTER — Telehealth: Payer: Self-pay

## 2023-08-26 NOTE — Progress Notes (Signed)
 Transition Care Management Follow-up Telephone Call Date of discharge and from where: Shawn [name] 12/20 How have you been since you were released from the hospital? Patient was nevder seen in the ED. Patient left on his own  Any questions or concerns? No  Items Reviewed: Did the pt receive and understand the discharge instructions provided? No  Medications obtained and verified? No  Other? No  Any new allergies since your discharge? No  Dietary orders reviewed? No Do you have support at home? Yes     Follow up appointments reviewed:  PCP Hospital f/u appt confirmed? Yes  Scheduled to see  on  @ . Specialist Hospital f/u appt confirmed? No  Scheduled to   on @ . Are transportation arrangements needed? No  If their condition worsens, is the pt aware to call PCP or go to the Emergency Dept.? Yes Was the patient provided with contact information for the PCP's office or ED? Yes Was to pt encouraged to call back with questions or concerns? Yes

## 2023-09-05 DIAGNOSIS — R531 Weakness: Secondary | ICD-10-CM | POA: Diagnosis not present

## 2023-09-05 DIAGNOSIS — M545 Low back pain, unspecified: Secondary | ICD-10-CM | POA: Diagnosis not present

## 2023-09-09 ENCOUNTER — Encounter: Payer: Self-pay | Admitting: Student

## 2023-09-09 ENCOUNTER — Other Ambulatory Visit: Payer: Self-pay | Admitting: Student

## 2023-09-09 DIAGNOSIS — M545 Low back pain, unspecified: Secondary | ICD-10-CM

## 2023-09-16 ENCOUNTER — Ambulatory Visit: Payer: Self-pay | Admitting: Family Medicine

## 2023-09-16 NOTE — Telephone Encounter (Signed)
Copied from CRM [id number]. Topic: Clinical - Red Word Triage >> Sep 16, 2023 11:32 AM Marlow Baars wrote: Red Word that prompted transfer to Nurse Triage: The patient called in stating he went recently to the ER due to back pain. He states he still has it and was found to have had a hernia while in the ER. He also complains of a possible uti now as he has urinary pressure, frequency and urgency. Please assist patient further. I will transfer him over to [facility]   Chief Complaint: Painful urination  Symptoms: Painful urination  Pertinent Negatives: Patient denies Fevers or other symptoms at this time  Disposition: [] ED /[] Urgent Care (no appt availability in office) / [x] Appointment(In office/virtual)/ []  [facility]/ [] Home Care/ [] Refused Recommended Disposition /[]  Mobile Bus/ []  Follow-up with PCP Additional Notes: Patient reports painful urination that began a few days ago. He rates the pain as 7/10 when present. Patient denies any fevers or other symptoms at this time. Patient asking for an appointment. Advised patient no appointment available and that he could follow up with urgent care but patient states he would rather wait. Appointment made for patient and patient advised to call back or go to urgent care if his symptoms worsen. Patient agreeable with this plan.       Reason for Disposition  All other males with painful urination  Answer Assessment - Initial Assessment Questions 1. SEVERITY: "How bad is the pain?"  (e.g., Scale 1-10; mild, moderate, or severe)   - MILD (1-3): Complains slightly about urination hurting.   - MODERATE (4-7): Interferes with normal activities.     - SEVERE (8-10): Excruciating, unwilling or unable to urinate because of the pain.      7/10 2. FREQUENCY: "How many times have you had painful urination today?"      Frequent  3. PATTERN: "Is pain present every time you urinate or just sometimes?"      Every time  4. ONSET: "When did the  painful urination start?"      A  few days ago 5. FEVER: "Do you have a fever?" If Yes, ask: "What is your temperature, how was it measured, and when did it start?"     No 7. CAUSE: "What do you think is causing the painful urination?"      Unsure  8. OTHER SYMPTOMS: "Do you have any other symptoms?" (e.g., flank pain, penis discharge, scrotal pain, blood in urine)     No  Protocols used: Urination Pain - Male-A-AH

## 2023-09-18 ENCOUNTER — Other Ambulatory Visit: Payer: Self-pay

## 2023-09-18 ENCOUNTER — Inpatient Hospital Stay (HOSPITAL_COMMUNITY)
Admission: EM | Admit: 2023-09-18 | Discharge: 2023-09-26 | DRG: 500 | Disposition: A | Discharge status: Home Health | Payer: Medicaid Other | Attending: Internal Medicine | Admitting: Internal Medicine

## 2023-09-18 DIAGNOSIS — K604 Rectal fistula, unspecified: Secondary | ICD-10-CM | POA: Diagnosis present

## 2023-09-18 DIAGNOSIS — K219 Gastro-esophageal reflux disease without esophagitis: Secondary | ICD-10-CM | POA: Diagnosis present

## 2023-09-18 DIAGNOSIS — N289 Disorder of kidney and ureter, unspecified: Secondary | ICD-10-CM | POA: Diagnosis not present

## 2023-09-18 DIAGNOSIS — M6008 Infective myositis, other site: Secondary | ICD-10-CM | POA: Diagnosis present

## 2023-09-18 DIAGNOSIS — K651 Peritoneal abscess: Secondary | ICD-10-CM | POA: Diagnosis present

## 2023-09-18 DIAGNOSIS — Z604 Social exclusion and rejection: Secondary | ICD-10-CM | POA: Diagnosis present

## 2023-09-18 DIAGNOSIS — Z951 Presence of aortocoronary bypass graft: Secondary | ICD-10-CM

## 2023-09-18 DIAGNOSIS — K612 Anorectal abscess: Secondary | ICD-10-CM | POA: Diagnosis present

## 2023-09-18 DIAGNOSIS — Z886 Allergy status to analgesic agent status: Secondary | ICD-10-CM

## 2023-09-18 DIAGNOSIS — R739 Hyperglycemia, unspecified: Secondary | ICD-10-CM | POA: Diagnosis present

## 2023-09-18 DIAGNOSIS — D638 Anemia in other chronic diseases classified elsewhere: Secondary | ICD-10-CM | POA: Diagnosis present

## 2023-09-18 DIAGNOSIS — K50813 Crohn's disease of both small and large intestine with fistula: Secondary | ICD-10-CM | POA: Diagnosis present

## 2023-09-18 DIAGNOSIS — Z7969 Long term (current) use of other immunomodulators and immunosuppressants: Secondary | ICD-10-CM

## 2023-09-18 DIAGNOSIS — D649 Anemia, unspecified: Secondary | ICD-10-CM | POA: Diagnosis not present

## 2023-09-18 DIAGNOSIS — I129 Hypertensive chronic kidney disease with stage 1 through stage 4 chronic kidney disease, or unspecified chronic kidney disease: Secondary | ICD-10-CM | POA: Diagnosis present

## 2023-09-18 DIAGNOSIS — K5903 Drug induced constipation: Secondary | ICD-10-CM | POA: Diagnosis present

## 2023-09-18 DIAGNOSIS — E538 Deficiency of other specified B group vitamins: Secondary | ICD-10-CM | POA: Diagnosis present

## 2023-09-18 DIAGNOSIS — K603 Anal fistula, unspecified: Secondary | ICD-10-CM | POA: Diagnosis present

## 2023-09-18 DIAGNOSIS — I10 Essential (primary) hypertension: Secondary | ICD-10-CM | POA: Diagnosis not present

## 2023-09-18 DIAGNOSIS — K6289 Other specified diseases of anus and rectum: Secondary | ICD-10-CM | POA: Diagnosis present

## 2023-09-18 DIAGNOSIS — R700 Elevated erythrocyte sedimentation rate: Secondary | ICD-10-CM | POA: Diagnosis present

## 2023-09-18 DIAGNOSIS — N1831 Chronic kidney disease, stage 3a: Secondary | ICD-10-CM | POA: Diagnosis present

## 2023-09-18 DIAGNOSIS — K50113 Crohn's disease of large intestine with fistula: Secondary | ICD-10-CM | POA: Diagnosis present

## 2023-09-18 DIAGNOSIS — F17210 Nicotine dependence, cigarettes, uncomplicated: Secondary | ICD-10-CM | POA: Diagnosis present

## 2023-09-18 DIAGNOSIS — G709 Myoneural disorder, unspecified: Secondary | ICD-10-CM | POA: Diagnosis present

## 2023-09-18 DIAGNOSIS — Z8781 Personal history of (healed) traumatic fracture: Secondary | ICD-10-CM

## 2023-09-18 DIAGNOSIS — Z9889 Other specified postprocedural states: Secondary | ICD-10-CM

## 2023-09-18 DIAGNOSIS — Z8619 Personal history of other infectious and parasitic diseases: Secondary | ICD-10-CM

## 2023-09-18 DIAGNOSIS — K5000 Crohn's disease of small intestine without complications: Secondary | ICD-10-CM | POA: Diagnosis present

## 2023-09-18 DIAGNOSIS — I420 Dilated cardiomyopathy: Secondary | ICD-10-CM | POA: Diagnosis present

## 2023-09-18 DIAGNOSIS — M4628 Osteomyelitis of vertebra, sacral and sacrococcygeal region: Principal | ICD-10-CM | POA: Diagnosis present

## 2023-09-18 DIAGNOSIS — Z91013 Allergy to seafood: Secondary | ICD-10-CM

## 2023-09-18 DIAGNOSIS — M545 Low back pain, unspecified: Secondary | ICD-10-CM | POA: Diagnosis present

## 2023-09-18 DIAGNOSIS — Z841 Family history of disorders of kidney and ureter: Secondary | ICD-10-CM

## 2023-09-18 DIAGNOSIS — Z833 Family history of diabetes mellitus: Secondary | ICD-10-CM

## 2023-09-18 DIAGNOSIS — K60329 Anal fistula, complex, unspecified: Secondary | ICD-10-CM | POA: Diagnosis present

## 2023-09-18 DIAGNOSIS — L0231 Cutaneous abscess of buttock: Secondary | ICD-10-CM

## 2023-09-18 DIAGNOSIS — D75839 Thrombocytosis, unspecified: Secondary | ICD-10-CM | POA: Diagnosis present

## 2023-09-18 DIAGNOSIS — N183 Chronic kidney disease, stage 3 unspecified: Secondary | ICD-10-CM | POA: Diagnosis present

## 2023-09-18 DIAGNOSIS — K605 Anorectal fistula, unspecified: Secondary | ICD-10-CM | POA: Diagnosis present

## 2023-09-18 LAB — CBC WITH DIFFERENTIAL/PLATELET
Abs Immature Granulocytes: 0.09 10*3/uL — ABNORMAL HIGH (ref 0.00–0.07)
Basophils Absolute: 0.1 10*3/uL (ref 0.0–0.1)
Basophils Relative: 0 %
Eosinophils Absolute: 0.4 10*3/uL (ref 0.0–0.5)
Eosinophils Relative: 3 %
HCT: 26 % — ABNORMAL LOW (ref 39.0–52.0)
Hemoglobin: 8.8 g/dL — ABNORMAL LOW (ref 13.0–17.0)
Immature Granulocytes: 1 %
Lymphocytes Relative: 22 %
Lymphs Abs: 3.2 10*3/uL (ref 0.7–4.0)
MCH: 28.5 pg (ref 26.0–34.0)
MCHC: 33.8 g/dL (ref 30.0–36.0)
MCV: 84.1 fL (ref 80.0–100.0)
Monocytes Absolute: 0.6 10*3/uL (ref 0.1–1.0)
Monocytes Relative: 4 %
Neutro Abs: 10 10*3/uL — ABNORMAL HIGH (ref 1.7–7.7)
Neutrophils Relative %: 70 %
Platelets: 574 10*3/uL — ABNORMAL HIGH (ref 150–400)
RBC: 3.09 MIL/uL — ABNORMAL LOW (ref 4.22–5.81)
RDW: 17.3 % — ABNORMAL HIGH (ref 11.5–15.5)
WBC: 14.3 10*3/uL — ABNORMAL HIGH (ref 4.0–10.5)
nRBC: 0 % (ref 0.0–0.2)

## 2023-09-18 LAB — URINALYSIS, ROUTINE W REFLEX MICROSCOPIC
Bilirubin Urine: NEGATIVE
Glucose, UA: NEGATIVE mg/dL
Hgb urine dipstick: NEGATIVE
Ketones, ur: NEGATIVE mg/dL
Leukocytes,Ua: NEGATIVE
Nitrite: NEGATIVE
Protein, ur: NEGATIVE mg/dL
Specific Gravity, Urine: 1.012 (ref 1.005–1.030)
pH: 6 (ref 5.0–8.0)

## 2023-09-18 LAB — BASIC METABOLIC PANEL
Anion gap: 10 (ref 5–15)
BUN: 18 mg/dL (ref 6–20)
CO2: 19 mmol/L — ABNORMAL LOW (ref 22–32)
Calcium: 8.7 mg/dL — ABNORMAL LOW (ref 8.9–10.3)
Chloride: 109 mmol/L (ref 98–111)
Creatinine, Ser: 1.52 mg/dL — ABNORMAL HIGH (ref 0.61–1.24)
GFR, Estimated: 59 mL/min — ABNORMAL LOW (ref >60)
Glucose, Bld: 106 mg/dL — ABNORMAL HIGH (ref 70–99)
Potassium: 4.2 mmol/L (ref 3.5–5.1)
Sodium: 138 mmol/L (ref 135–145)

## 2023-09-18 LAB — RAPID URINE DRUG SCREEN, HOSP PERFORMED
Amphetamines: NOT DETECTED
Barbiturates: NOT DETECTED
Benzodiazepines: NOT DETECTED
Cocaine: NOT DETECTED
Opiates: NOT DETECTED
Tetrahydrocannabinol: NOT DETECTED

## 2023-09-18 NOTE — ED Provider Triage Note (Signed)
Emergency Medicine Provider Triage Evaluation Note  Shawn [name] , a 38 y.o. male  was evaluated in triage.  Pt complains of low back pain radiating down L leg and ankle x 1 month. Tingling in both legs. MRI scheduled for Friday. Took last percocet Friday. Last bowel movement 5 days ago.  Endorses shortness of breath, nausea, dysuria.  Denies fever, cough, congestion, chest pain, abdominal pain, vomiting, diarrhea, incontinence, genital numbness, LE swelling.   Review of Systems  Positive: See above Negative: See above   Physical Exam  BP 108/89 (BP Location: Right Arm)   Pulse 79   Temp 98 F (36.7 C) (Oral)   Resp 16   Ht 6\' 1"  (1.854 m)   Wt 93 kg   SpO2 100%   BMI 27.05 kg/m  Gen:   Awake, no distress   Resp:  Normal effort  MSK:   Moves extremities without difficulty  Other:    Medical Decision Making  Medically screening exam initiated at 5:07 PM.  Appropriate orders placed.  Shawn [name] was informed that the remainder of the evaluation will be completed by another provider, this initial triage assessment does not replace that evaluation, and the importance of remaining in the ED until their evaluation is complete.     Lunette Stands, [place] 09/18/23 1711

## 2023-09-18 NOTE — ED Notes (Signed)
Pt requested not to have vitals taken

## 2023-09-18 NOTE — ED Triage Notes (Signed)
Patient complains of lower back pain that radiates down left leg.  Reports dizziness, sob as well.  Patient reports has an MRI scheduled for Friday.  Reports he has been in pain too long.  Left leg pain associated with back pain.

## 2023-09-19 ENCOUNTER — Emergency Department (HOSPITAL_COMMUNITY): Payer: Medicaid Other

## 2023-09-19 ENCOUNTER — Inpatient Hospital Stay (HOSPITAL_COMMUNITY): Payer: Medicaid Other

## 2023-09-19 ENCOUNTER — Telehealth: Payer: Self-pay

## 2023-09-19 ENCOUNTER — Encounter (HOSPITAL_COMMUNITY): Payer: Self-pay | Admitting: Internal Medicine

## 2023-09-19 DIAGNOSIS — K651 Peritoneal abscess: Secondary | ICD-10-CM | POA: Diagnosis not present

## 2023-09-19 DIAGNOSIS — L0231 Cutaneous abscess of buttock: Secondary | ICD-10-CM | POA: Diagnosis not present

## 2023-09-19 DIAGNOSIS — Z8781 Personal history of (healed) traumatic fracture: Secondary | ICD-10-CM | POA: Diagnosis not present

## 2023-09-19 DIAGNOSIS — D638 Anemia in other chronic diseases classified elsewhere: Secondary | ICD-10-CM | POA: Diagnosis not present

## 2023-09-19 DIAGNOSIS — K50813 Crohn's disease of both small and large intestine with fistula: Secondary | ICD-10-CM | POA: Diagnosis not present

## 2023-09-19 DIAGNOSIS — L02818 Cutaneous abscess of other sites: Secondary | ICD-10-CM | POA: Diagnosis not present

## 2023-09-19 DIAGNOSIS — K610 Anal abscess: Secondary | ICD-10-CM | POA: Diagnosis not present

## 2023-09-19 DIAGNOSIS — M549 Dorsalgia, unspecified: Secondary | ICD-10-CM | POA: Diagnosis not present

## 2023-09-19 DIAGNOSIS — K6289 Other specified diseases of anus and rectum: Secondary | ICD-10-CM | POA: Diagnosis not present

## 2023-09-19 DIAGNOSIS — M4628 Osteomyelitis of vertebra, sacral and sacrococcygeal region: Principal | ICD-10-CM | POA: Diagnosis present

## 2023-09-19 DIAGNOSIS — I420 Dilated cardiomyopathy: Secondary | ICD-10-CM | POA: Diagnosis not present

## 2023-09-19 DIAGNOSIS — M6008 Infective myositis, other site: Secondary | ICD-10-CM | POA: Diagnosis not present

## 2023-09-19 DIAGNOSIS — E538 Deficiency of other specified B group vitamins: Secondary | ICD-10-CM | POA: Diagnosis not present

## 2023-09-19 DIAGNOSIS — R739 Hyperglycemia, unspecified: Secondary | ICD-10-CM | POA: Diagnosis not present

## 2023-09-19 DIAGNOSIS — K612 Anorectal abscess: Secondary | ICD-10-CM | POA: Diagnosis not present

## 2023-09-19 DIAGNOSIS — K6819 Other retroperitoneal abscess: Secondary | ICD-10-CM | POA: Diagnosis not present

## 2023-09-19 DIAGNOSIS — D649 Anemia, unspecified: Secondary | ICD-10-CM | POA: Diagnosis not present

## 2023-09-19 DIAGNOSIS — Z9889 Other specified postprocedural states: Secondary | ICD-10-CM | POA: Diagnosis not present

## 2023-09-19 DIAGNOSIS — G9519 Other vascular myelopathies: Secondary | ICD-10-CM | POA: Diagnosis not present

## 2023-09-19 DIAGNOSIS — I129 Hypertensive chronic kidney disease with stage 1 through stage 4 chronic kidney disease, or unspecified chronic kidney disease: Secondary | ICD-10-CM | POA: Diagnosis not present

## 2023-09-19 DIAGNOSIS — K219 Gastro-esophageal reflux disease without esophagitis: Secondary | ICD-10-CM | POA: Diagnosis not present

## 2023-09-19 DIAGNOSIS — M4804 Spinal stenosis, thoracic region: Secondary | ICD-10-CM | POA: Diagnosis not present

## 2023-09-19 DIAGNOSIS — N3289 Other specified disorders of bladder: Secondary | ICD-10-CM | POA: Diagnosis not present

## 2023-09-19 DIAGNOSIS — R609 Edema, unspecified: Secondary | ICD-10-CM | POA: Diagnosis not present

## 2023-09-19 DIAGNOSIS — N289 Disorder of kidney and ureter, unspecified: Secondary | ICD-10-CM | POA: Diagnosis not present

## 2023-09-19 DIAGNOSIS — G709 Myoneural disorder, unspecified: Secondary | ICD-10-CM | POA: Diagnosis not present

## 2023-09-19 DIAGNOSIS — K50113 Crohn's disease of large intestine with fistula: Secondary | ICD-10-CM | POA: Diagnosis not present

## 2023-09-19 DIAGNOSIS — K604 Rectal fistula, unspecified: Secondary | ICD-10-CM | POA: Diagnosis not present

## 2023-09-19 DIAGNOSIS — R700 Elevated erythrocyte sedimentation rate: Secondary | ICD-10-CM | POA: Diagnosis not present

## 2023-09-19 DIAGNOSIS — Z8619 Personal history of other infectious and parasitic diseases: Secondary | ICD-10-CM | POA: Diagnosis not present

## 2023-09-19 DIAGNOSIS — D75839 Thrombocytosis, unspecified: Secondary | ICD-10-CM | POA: Diagnosis not present

## 2023-09-19 DIAGNOSIS — I10 Essential (primary) hypertension: Secondary | ICD-10-CM | POA: Diagnosis not present

## 2023-09-19 DIAGNOSIS — N1831 Chronic kidney disease, stage 3a: Secondary | ICD-10-CM | POA: Diagnosis not present

## 2023-09-19 DIAGNOSIS — Z604 Social exclusion and rejection: Secondary | ICD-10-CM | POA: Diagnosis not present

## 2023-09-19 DIAGNOSIS — F17210 Nicotine dependence, cigarettes, uncomplicated: Secondary | ICD-10-CM | POA: Diagnosis not present

## 2023-09-19 DIAGNOSIS — M545 Low back pain, unspecified: Secondary | ICD-10-CM | POA: Diagnosis not present

## 2023-09-19 DIAGNOSIS — K5903 Drug induced constipation: Secondary | ICD-10-CM | POA: Diagnosis not present

## 2023-09-19 DIAGNOSIS — K5090 Crohn's disease, unspecified, without complications: Secondary | ICD-10-CM | POA: Diagnosis not present

## 2023-09-19 DIAGNOSIS — M8618 Other acute osteomyelitis, other site: Secondary | ICD-10-CM | POA: Diagnosis not present

## 2023-09-19 DIAGNOSIS — M5134 Other intervertebral disc degeneration, thoracic region: Secondary | ICD-10-CM | POA: Diagnosis not present

## 2023-09-19 LAB — SEDIMENTATION RATE: Sed Rate: 110 mm/h — ABNORMAL HIGH (ref 0–16)

## 2023-09-19 LAB — HIV ANTIBODY (ROUTINE TESTING W REFLEX): HIV Screen 4th Generation wRfx: NONREACTIVE

## 2023-09-19 LAB — C-REACTIVE PROTEIN: CRP: 14.3 mg/dL — ABNORMAL HIGH (ref <1.0)

## 2023-09-19 MED ORDER — GADOBUTROL 1 MMOL/ML IV SOLN
9.0000 mL | Freq: Once | INTRAVENOUS | Status: AC | PRN
  Administered 2023-09-19: 9 mL via INTRAVENOUS

## 2023-09-19 MED ORDER — ACETAMINOPHEN 650 MG RE SUPP: 650.0000 mg | Freq: Four times a day (QID) | RECTAL | Status: DC | PRN

## 2023-09-19 MED ORDER — PIPERACILLIN-TAZOBACTAM 3.375 G IVPB
3.3750 g | Freq: Three times a day (TID) | INTRAVENOUS | Status: DC
  Administered 2023-09-19: 3.375 g via INTRAVENOUS
  Filled 2023-09-19: qty 50

## 2023-09-19 MED ORDER — HYDROMORPHONE HCL 1 MG/ML IJ SOLN
1.0000 mg | Freq: Once | INTRAMUSCULAR | Status: AC
Start: 2023-09-19 — End: 2023-09-19
  Administered 2023-09-19: 1 mg via INTRAVENOUS
  Filled 2023-09-19: qty 1

## 2023-09-19 MED ORDER — HEPARIN SODIUM (PORCINE) 5000 UNIT/ML IJ SOLN
5000.0000 [IU] | Freq: Three times a day (TID) | INTRAMUSCULAR | Status: DC
  Administered 2023-09-19 (×3): 5000 [IU] via SUBCUTANEOUS
  Filled 2023-09-19 (×4): qty 1

## 2023-09-19 MED ORDER — ACETAMINOPHEN 325 MG PO TABS: 650.0000 mg | ORAL_TABLET | Freq: Four times a day (QID) | ORAL | Status: DC | PRN

## 2023-09-19 MED ORDER — CEFEPIME HCL 2 G IV SOLR
2.0000 g | Freq: Once | INTRAVENOUS | Status: DC
  Filled 2023-09-19: qty 12.5

## 2023-09-19 MED ORDER — GADOBUTROL 1 MMOL/ML IV SOLN
9.0000 mL | Freq: Once | INTRAVENOUS | Status: DC | PRN
Start: 2023-09-19 — End: 2023-09-27

## 2023-09-19 MED ORDER — VANCOMYCIN HCL IN DEXTROSE 1-5 GM/200ML-% IV SOLN
1000.0000 mg | Freq: Once | INTRAVENOUS | Status: DC
Start: 2023-09-19 — End: 2023-09-19
  Filled 2023-09-19: qty 200

## 2023-09-19 MED ORDER — HYDROMORPHONE HCL 2 MG PO TABS: 1.0000 mg | ORAL_TABLET | ORAL | Status: DC | PRN

## 2023-09-19 MED ORDER — HYDROMORPHONE HCL 1 MG/ML IJ SOLN
1.0000 mg | INTRAMUSCULAR | Status: DC | PRN
  Administered 2023-09-19 – 2023-09-24 (×24): 1 mg via INTRAVENOUS
  Filled 2023-09-19 (×25): qty 1

## 2023-09-19 MED ORDER — LORAZEPAM 1 MG PO TABS
1.0000 mg | ORAL_TABLET | Freq: Once | ORAL | Status: AC
  Administered 2023-09-19: 1 mg via ORAL
  Filled 2023-09-19: qty 1

## 2023-09-19 MED ORDER — MORPHINE SULFATE (PF) 4 MG/ML IV SOLN
4.0000 mg | Freq: Once | INTRAVENOUS | Status: AC
Start: 2023-09-19 — End: 2023-09-19
  Administered 2023-09-19: 4 mg via INTRAVENOUS
  Filled 2023-09-19: qty 1

## 2023-09-19 MED ORDER — VANCOMYCIN HCL IN DEXTROSE 1-5 GM/200ML-% IV SOLN
1000.0000 mg | Freq: Once | INTRAVENOUS | Status: AC
  Administered 2023-09-19: 1000 mg via INTRAVENOUS
  Filled 2023-09-19: qty 200

## 2023-09-19 MED ORDER — IOHEXOL 350 MG/ML SOLN
75.0000 mL | Freq: Once | INTRAVENOUS | Status: AC | PRN
  Administered 2023-09-19: 75 mL via INTRAVENOUS

## 2023-09-19 NOTE — Progress Notes (Signed)
Pt arrived on the unit stated he was in pain, pain medication given and water. Pt stated that he was comfortable at the moment, call bell within reach. Pt AOX4

## 2023-09-19 NOTE — H&P (Cosign Needed Addendum)
Date: 09/19/2023               Patient Name:  Shawn [name] MRN: [id number]  DOB: 08/18/1986 Age / Sex: 38 y.o., male   PCP: Hoy Register, MD         Medical Service: Internal Medicine Teaching Service         Attending Physician: Dr. Reymundo Poll, MD    First Contact: Dr. Laretta Bolster Pager: [phone]  Second Contact: Dr. Rana Snare Pager: [phone]       After Hours (After 5p/  First Contact Pager: [phone]  weekends / holidays): Second Contact Pager: [phone]   Chief Complaint: back pain  History of Present Illness: Shawn [name] is a 38 y.o. 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  He explains that since his last OP visits with ID, surgery, and GI, he has been doing well but one month ago developed worsening of baseline low back pain that has been getting worse. He has not had fever, chills, incontinence of bowel or bladder, numbness of the LE but has had weakness of the LE and tingling which was initially bilateral but progressed to mainly affect LLE. He has drainage from his anorectal fistula that is not new and has not changed--he uses gauze pads to absorb the drainage. He was last seen OP by ID, surgery, and GI in August. He denies active flare of Crohn's disease and was told after his colonoscopy in August that he was in remission. He was scheduled for follow-up imaging this Friday but due to unbearable pain, he came in to be evaluated sooner.  Pertinent labs include neutrophil-predominant leukocytosis (14.3), normocytic anemia (8.8); renal function consistent with CKD stage 3a, comparable to values over the last 1 year (BUN 18/Cr 1.52, GFR 59); unremarkable UA; negative UDS; elevated ESR (110) and CRP (14.3). Imaging concerning for lower sacral osteomyelitis with presacral abscess. In  the ED he was given cefepime and vancomycin. IMTS paged for admission.  Past Medical History: 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   PO B12 initiated 12/31/20   Anxiety    Arthritis    Chronic kidney disease    Crohn's colitis (HCC)    Dilated cardiomyopathy (HCC) 05/18/2013   By catheterization 2014    Fistula    GERD (gastroesophageal reflux disease)    Hypertension    Neuromuscular disorder (HCC)    Osteomyelitis (HCC)    tailbone   Past Surgical History: Past Surgical History:  Procedure Laterality Date   AV FISTULA REPAIR  07/07/2021   BIOPSY  01/01/2021   Procedure: BIOPSY;  Surgeon: Meryl Dare, MD;  Location: [facility] ENDOSCOPY;  Service: Endoscopy;;   COLONOSCOPY WITH PROPOFOL N/A 01/01/2021   Procedure: COLONOSCOPY WITH PROPOFOL;  Surgeon: Meryl Dare, MD;  Location: [facility] ENDOSCOPY;  Service: Endoscopy;  Laterality: N/A;   INCISION AND DRAINAGE ABSCESS N/A 07/07/2021   Procedure: MODIFIED HANLEY PROCEDURE, DRAINAGE WITH SETON PLACEMENT;  Surgeon: Karie Soda, MD;  Location: [facility] ORS;  Service: General;  Laterality: N/A;   LEFT AND RIGHT HEART CATHETERIZATION WITH CORONARY ANGIOGRAM N/A 11/21/2012   Procedure: LEFT AND RIGHT HEART CATHETERIZATION WITH CORONARY ANGIOGRAM;  Surgeon: Ricki Rodriguez, MD;  Location: [facility] CATH LAB;  Service: Cardiovascular;  Laterality:  N/A;   MANDIBLE FRACTURE SURGERY  08/23/2004   PLACEMENT OF SETON N/A 07/07/2021   Procedure: PLACEMENT OF SETON;  Surgeon: Karie Soda, MD;  Location: [facility] ORS;  Service: General;  Laterality: N/A;   PLACEMENT OF SETON N/A 01/15/2022   Procedure: PLACEMENT OF SETON;  Surgeon: Karie Soda, MD;  Location: [facility] ORS;  Service: General;  Laterality: N/A;   RECTAL EXAM UNDER ANESTHESIA N/A 01/15/2022   Procedure: RECTAL EXAM UNDER ANESTHESIA;  Surgeon: Karie Soda, MD;  Location: [facility] ORS;  Service: General;  Laterality: N/A;  TRANSRECTAL  DRAINAGE REPLACEMENT OF SETON EXCISION OF PERINEAL SINUS TRACT ANORECTAL EXAM UNDER ANESTHESIA   Meds:  Acetaminopehn 1000 mg q8h PRN moderate pain Lidocaine patch 1 patch daily Multivitamin 1 tablet daily Oxycodone-acetaminophen 1-2 tablets q6h PRN severe pain Vitamin B12 1000 mcg daily  Allergies: Allergies as of 09/18/2023 - Review Complete 09/18/2023  Allergen Reaction Noted   Shrimp [shellfish allergy] Shortness Of Breath 05/16/2013   Nsaids Other (See Comments) 07/06/2021   Family History:  Family History  Problem Relation Age of Onset   Diabetes Mother    Kidney failure Mother    Diabetes Sister    Colon cancer Neg Hx    Stomach cancer Neg Hx    Esophageal cancer Neg Hx    Pancreatic disease Neg Hx    Colon polyps Neg Hx    Rectal cancer Neg Hx    Social History: Independent in ADLs and IADLs. Drinks alcohol every 1-2 weeks, smokes 1 cigarette per day, denies other drug use. Is in-between PCPs, establishing with new provider was scheduled for this week.  Review of Systems: A complete ROS was negative except as per HPI.   Physical Exam: Blood pressure 122/61, pulse 81, temperature 97.8 F (36.6 C), temperature source Temporal, resp. rate 16, height 6\' 1"  (1.854 m), weight 93 kg, SpO2 100%. Physical Exam Constitutional:      General: He is not in acute distress.    Appearance: He is not toxic-appearing.  Cardiovascular:     Rate and Rhythm: Normal rate and regular rhythm.  Pulmonary:     Effort: Pulmonary effort is normal.     Breath sounds: Normal breath sounds.  Abdominal:     General: There is no distension.     Palpations: Abdomen is soft.     Tenderness: There is no abdominal tenderness.  Genitourinary:    Comments: White-colored discharge in gluteal cleft.  Musculoskeletal:     Right lower leg: No edema.     Left lower leg: No edema.     Comments: Tenderness to palpation over sacrum.  Skin:    General: Skin is warm and dry.  Neurological:      General: No focal deficit present.     Mental Status: He is alert and oriented to person, place, and time.  Psychiatric:        Mood and Affect: Mood normal.        Behavior: Behavior normal.    EKG: NSR.  MRI thoracic spine: 1. No acute or inflammatory abnormality in the Thoracic Spine.  2. Multilevel thoracic spine degeneration chiefly in the form of posterior element hypertrophy. Isolated mild thoracic spinal stenosis at T10-T11 when combined with disc bulging there. No thoracic spinal cord mass effect or signal abnormality. Moderate to severe associated neural foraminal stenosis at the right T3, right T9, left T11 and T12 nerve levels.  MRI lumbar spine: 1. Evidence of lower Sacral Osteomyelitis with presacral Abscess, Phlegmon: Partially  visible abnormal sacrum beginning at the S3 body with marrow edema and enhancement. And abnormal presacral soft tissue fluid, edema, and enhancement. See further details on Pelvis MRI today reported separately. 2. No acute or inflammatory process identified in the lumbar spine. No lumbar spinal stenosis.  MRI pelvis: 1. Enlarging presacral fluid collection with peripheral enhancement suspicious for an abscess. There is asymmetric extension of peripherally enhancing fluid into the posterior aspect of the left greater sciatic notch. 2. Increased marrow T2 hyperintensity and enhancement within the adjacent mid to distal sacrum and coccyx suspicious for early osteomyelitis. 3. Presacral inflammatory changes extend into the adjacent piriformis and gluteus maximus muscles bilaterally with associated small intramuscular fluid collections. 4. No evidence of sacroiliitis or hip joint septic arthritis.  Assessment & Plan by Problem: Principal Problem:   Osteomyelitis of sacrum (HCC)  Sacral osteomyelitis Hx coccyx osteomyelitis  Crohn's disease with anorectal fistula In the setting of complicated IBD history with known anorectal fistula, managed outpatient  by [facility], who has undergone multiple procedures including removal of part of his coccyx due to osteomyelitis, due for follow up imaging this week but due to pain presented to ED. He was last seen by Dr. Luciana Axe with ID in 03/2023 who noted at that time no active infection. He reports remission of Crohn's disease and is no longer on Stelara, however on colonoscopy report 03/2023 Dr. Russella Dar notes moderate-severe inflammation from the anus to rectum though this was improved from prior. He has been well until the pain started about 1 month ago. He is hemodynamically stable, though does have elevated leukocytosis, ESR, CRP (ESR and CRP appear to be chronically elevated. S/p cefepime and vancomycin in ED. Blood cultures not collected prior to initiation of antibiotics. He is not currently taking Stelara. Plan: -ID consulted, appreciate their recommendations -General surgery consulted, appreciate their recommendations -Trend CBC, fever curve -F/u blood cultures -Pain control with IV dilaudid 1 mg q4h PRN moderate-severe pain; transition to PO once able  CKD stage 3a Renal function stable at this time compared to prior. Plan: -Trend renal function -Avoid nephrotoxic agents  Anemia Hx vitamin B12 deficiency Hgb decreased from prior at 8.8; normocytic. No signs of overt bleeding. Hemodynamically stable, asymptomatic. Plan: -Trend CBC, transfuse for Hgb <7 -Resume home vitamin B12, MV-iron-minerals-folic acid supplementations once taking PO  Dispo: Admit patient to Inpatient with expected length of stay greater than 2 midnights.  SignedChamp Mungo, DO 09/19/2023, 8:50 AM  After 5pm on weekdays and 1pm on weekends: On Call pager: [phone]

## 2023-09-19 NOTE — Consult Note (Addendum)
I have seen and examined the patient. I have personally reviewed the clinical findings, laboratory findings, microbiological data and imaging studies. The assessment and treatment plan was discussed with the Nurse Practitioner. I agree with her/his recommendations except following additions/corrections.  38 Y O male with h/o crohn's disease in remission with anorectal fistul and prior complicated h/o coccygeal osteomyelitis  s/p modified hanley procedure with seton placement  in 07/07/2021 followed by excision of perineal tract and seton replacement in 01/15/22 admitted with worsening back pain with new infective findings in CT/MRI as below  Exam - adult male lying in the bed in ED hallway, HEENT wnl. Heart, lung and abdomen wnl. MSK no pedal edema, no rashes. Awake, alert and oriented. Deferred further exam due to privacy concerns.   Hold off on abtx for now pending surgical vs IR plans for intervention. Please send aerobic and anaerobic cultures as appropriate Fu blood cx Start Daptomycin, ceftriaxone and metronidazole post procedure or any signs of sepsis.  Monitor CBC, CMP, CPK Following   [facility]    Date of Admission:  09/18/2023     Total days of antibiotics 0  1 dose of vancomycin in ER (zosyn was discarded after spiked)               Reason for Consult: Sacral Osteomyelitis with Presacral Abscess     Referring Provider: IMT Primary Care Provider: Hoy Register, MD   Assessment: Shawn [name] is a 38 y.o. male admitted from home with:   Recurrent / New Presacral Abscess -  Increased T2 Coccyx, ?Early Osteomyelitis -  Abnormal MRI of the pelvis with 7.3 x 4.0 x 1.5 cm fluid collection noted in the presacral region concerning for abscess. Several smaller intramuscular fluid collections mentioned c/w HS history.  Abnormal T2 signal within the contiguous distal sacrum and coccyx suspicious for early osteomyelitis. Will check CRP, ESR. He has not  had any signs of systemic features.  He has some resistance to Augmentin / Amp-Sulb with last E coli isolate and E Faecalis in the past.  -Given overall stability and absence of SIRS criteria, will hold antibiotics until surgery / IR can evaluation plan to drain abscess to help with cultures to guide treatment longer term   -After successful drainage would use IV Vancomycin, Ceftriaxone and Metronidazole until we get time for cultures to return.  -CRP, ESR in AM   Medication Monitoring -  Follow up creatinine and vanc levels.   Hx Chron's Colitis - in remission  Not currently on any biologic agents. Previously on Stelara earlier in May 2024.    Plan: Hold abx until drainage achieved as long as he continues to remain stable  CRP / ESR in AM - possible early OM noted - soft call on pelvic MRI  After #1 - will start IV daptomycin, Ceftriaxone and Metronidazole (PO if able)    Principal Problem:   Osteomyelitis of sacrum (HCC)    heparin  5,000 Units Subcutaneous Q8H    HPI: Shawn [name] is a 38 y.o. male being admitted from home with worsening back pain.  He has a h/o coccyx osteomyelitis s/p resection and perirectal abscess. He has a history of chron's disease and hidradinitis, HTN  and NICM.   In Aug 2022 he had worsening of OM and more abscesses despite prolonged augmentin following drainage.  In Nov 2022 he was admitted again with probably coccyx OM and underwent [facility] with [facility] procedure and coccygectomy.  Cultures at  this time were amp/sulb resistant E coli.  He follows with Dr. Michaell Cowing with colorectal team. Was taking Remicaid per Dec 2022 note.  In Aug 2024 he came to see Dr. Luciana Axe following ER visit. He has had constant pain since surgery - CT scan of pelvis at that time w/o new infectious concerns.  December 2024 back in ER with pain and tingling down back of leg. Small left sided persistent anorectal fistula noted w/o drain able abscess.   He has not had any change to the  quality, amount or consistency of drainage from anorectal fistula. Follows with D.r Michaell Cowing and he said it was normal to have ongoing drainage here.  No fevers / chills.  Main feature is pain on bottom deep in body. Nothing superficial bothers him.    Review of Systems: Review of Systems  Constitutional:  Negative for chills, diaphoresis, fever, malaise/fatigue and weight loss.  Respiratory: Negative.    Cardiovascular: Negative.   Genitourinary:  Negative for dysuria.  Musculoskeletal:  Positive for back pain.  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   PO B12 initiated 12/31/20   Anxiety    Arthritis    Chronic kidney disease    Crohn's colitis (HCC)    Dilated cardiomyopathy (HCC) 05/18/2013   By catheterization 2014    Fistula    GERD (gastroesophageal reflux disease)    Hypertension    Neuromuscular disorder (HCC)    Osteomyelitis (HCC)    tailbone   Past Surgical History:  Procedure Laterality Date   AV FISTULA REPAIR  07/07/2021   BIOPSY  01/01/2021   Procedure: BIOPSY;  Surgeon: Meryl Dare, MD;  Location: [facility] ENDOSCOPY;  Service: Endoscopy;;   COLONOSCOPY WITH PROPOFOL N/A 01/01/2021   Procedure: COLONOSCOPY WITH PROPOFOL;  Surgeon: Meryl Dare, MD;  Location: [facility] ENDOSCOPY;  Service: Endoscopy;  Laterality: N/A;   INCISION AND DRAINAGE ABSCESS N/A 07/07/2021   Procedure: MODIFIED HANLEY PROCEDURE, DRAINAGE WITH SETON PLACEMENT;  Surgeon: Karie Soda, MD;  Location: [facility] ORS;  Service: General;  Laterality: N/A;   LEFT AND RIGHT HEART CATHETERIZATION WITH CORONARY ANGIOGRAM N/A 11/21/2012   Procedure: LEFT AND RIGHT HEART CATHETERIZATION WITH CORONARY ANGIOGRAM;  Surgeon: Ricki Rodriguez, MD;  Location: [facility] CATH LAB;  Service: Cardiovascular;  Laterality: N/A;   MANDIBLE FRACTURE SURGERY  08/23/2004   PLACEMENT OF SETON N/A  07/07/2021   Procedure: PLACEMENT OF SETON;  Surgeon: Karie Soda, MD;  Location: [facility] ORS;  Service: General;  Laterality: N/A;   PLACEMENT OF SETON N/A 01/15/2022   Procedure: PLACEMENT OF SETON;  Surgeon: Karie Soda, MD;  Location: [facility] ORS;  Service: General;  Laterality: N/A;   RECTAL EXAM UNDER ANESTHESIA N/A 01/15/2022   Procedure: RECTAL EXAM UNDER ANESTHESIA;  Surgeon: Karie Soda, MD;  Location: [facility] ORS;  Service: General;  Laterality: N/A;  TRANSRECTAL DRAINAGE REPLACEMENT OF SETON EXCISION OF PERINEAL SINUS TRACT ANORECTAL EXAM UNDER ANESTHESIA    Social History   Tobacco Use   Smoking status: Former    Current packs/day: 0.25    Types: Cigarettes   Smokeless tobacco: Never  Vaping Use   Vaping status: Never Used  Substance Use Topics   Alcohol use: Not Currently    Comment: occ   Drug use: No    Family History  Problem Relation Age  of Onset   Diabetes Mother    Kidney failure Mother    Diabetes Sister    Colon cancer Neg Hx    Stomach cancer Neg Hx    Esophageal cancer Neg Hx    Pancreatic disease Neg Hx    Colon polyps Neg Hx    Rectal cancer Neg Hx    Allergies  Allergen Reactions   Shrimp [Shellfish Allergy] Shortness Of Breath   Nsaids Other (See Comments)    CROHN'S DISEASE = NO NSAIDS    OBJECTIVE: Blood pressure 100/60, pulse 72, temperature 98.5 F (36.9 C), temperature source Oral, resp. rate 16, height 6\' 1"  (1.854 m), weight 93 kg, SpO2 100%.  Physical Exam Constitutional:      Appearance: Normal appearance.  HENT:     Mouth/Throat:     Mouth: Mucous membranes are dry.  Eyes:     Extraocular Movements: Extraocular movements intact.     Pupils: Pupils are equal, round, and reactive to light.  Cardiovascular:     Rate and Rhythm: Normal rate and regular rhythm.  Pulmonary:     Effort: Pulmonary effort is normal.     Breath sounds: Normal breath sounds.  Abdominal:     General: There is no distension.     Palpations: Abdomen is  soft.     Tenderness: There is no abdominal tenderness.  Genitourinary:    Comments: Deferred exam given hallway bed and privacy concerns  Skin:    General: Skin is warm and dry.  Neurological:     Mental Status: He is alert.     Lab Results Lab Results  Component Value Date   WBC 14.3 (H) 09/18/2023   HGB 8.8 (L) 09/18/2023   HCT 26.0 (L) 09/18/2023   MCV 84.1 09/18/2023   PLT 574 (H) 09/18/2023    Lab Results  Component Value Date   CREATININE 1.52 (H) 09/18/2023   BUN 18 09/18/2023   NA 138 09/18/2023   K 4.2 09/18/2023   CL 109 09/18/2023   CO2 19 (L) 09/18/2023    Lab Results  Component Value Date   ALT 27 04/03/2023   AST 21 04/03/2023   ALKPHOS 123 04/03/2023   BILITOT 0.6 04/03/2023     Microbiology: No results found for this or any previous visit (from the past 240 hours).  Imaging CT PELVIS W CONTRAST Result Date: 09/19/2023 CLINICAL DATA:  R/O Pelvic abscess assess pre sacral collection for possible IR drainage EXAM: CT PELVIS WITH CONTRAST TECHNIQUE: Multidetector CT imaging of the pelvis was performed using the standard protocol following the bolus administration of intravenous contrast. RADIATION DOSE REDUCTION: This exam was performed according to the departmental dose-optimization program which includes automated exposure control, adjustment of the mA and/or kV according to patient size and/or use of iterative reconstruction technique. CONTRAST:  75mL OMNIPAQUE IOHEXOL 350 MG/ML SOLN COMPARISON:  MR pelvis, 09/19/2023.  CT AP, 08/22/2023. FINDINGS: Urinary Tract:  Minimal urinary bladder distention. Bowel:  Imaged portions of small bowel and colon are nondilated. Vascular/Lymphatic: No pathologically enlarged lymph nodes. No significant vascular abnormality seen. Reproductive:  Prostate is within normal limits. Other: Stable positioning of drain seton at the anorectal verge. Increased peri-rectal stranding with air and fluid tracking along the LEFT rectal  wall, consistent with a persistent fistula-in-ano. See key image. Small intramuscular rim-enhancing air-and fluid collections within the medial and deep margins of the gluteus maximus, largest measuring up to 1.5 cm RIGHT and 3.0 cm LEFT. Additional rim-enhancing air-fluid containing collection  along the sacrum, measuring up to 2.0 x 4.0 x 6.0 cm (AP by transaxial by CC). No discrete underlying focal demineralization, erosion or periosteal change at the sacrum or coccyx. Musculoskeletal: Small fat-containing RIGHT inguinal hernia versus cord lipoma. No acute osseous abnormality IMPRESSION: 1. LEFT-sided persistent anorectal fistula with small intramuscular abscesses along the medial and deep margins of the gluteus maximus, as above. 2. Additional small abscess coursing longitudinally along the sacrum. No overt CT evidence of sacrococcygeal osteomyelitis. Electronically Signed   By: Roanna Banning M.D.   On: 09/19/2023 16:29   MR PELVIS W WO CONTRAST Result Date: 09/19/2023 CLINICAL DATA:  Low back pain. History of perianal abscess/fistula. Clinical concern for osteomyelitis. EXAM: MRI PELVIS WITHOUT AND WITH CONTRAST TECHNIQUE: Multiplanar multisequence MR imaging of the pelvis was performed both before and after administration of intravenous contrast. CONTRAST:  9mL GADAVIST GADOBUTROL 1 MMOL/ML IV SOLN COMPARISON:  MRI pelvis 05/16/2021, CT pelvis 08/22/2023 and lumbar MRI 09/19/2023. FINDINGS: Bones/Joint/Cartilage There are chronic perirectal and presacral inflammatory changes which have progressed from previous MRI. Recent CT demonstrated a perianal fistula with seton tube in place. A presacral fluid collection with a small amount of internal air and heterogeneous surrounding enhancement appears enlarged from the most recent CT, measuring up to 7.3 x 4.0 x 1.5 cm. Appearance is suspicious for a presacral abscess. There is increased marrow T2 hyperintensity and enhancement within the adjacent mid to distal  sacrum and coccyx suspicious for early osteomyelitis. No gross cortical destruction identified. The sacroiliac joints appear intact. No significant hip joint effusion or abnormal synovial enhancement. The remainder of the bony pelvis appears intact. Ligaments No significant ligamentous abnormalities are identified. Muscles and Tendons Presacral inflammatory changes extend into the adjacent piriformis and gluteus maximus muscles bilaterally. There are associated small intramuscular fluid collections with peripheral enhancement. The common hamstring and iliopsoas tendons appear unremarkable. Soft tissue As above, enlarging complex presacral fluid collection with peripheral enhancement suspicious for an abscess. There is asymmetric extension of peripherally enhancing fluid into the posterior aspect of the left greater sciatic notch, measuring 2.0 x 1.3 x 1.3 cm. Grossly stable underlying rectal wall thickening. IMPRESSION: 1. Enlarging presacral fluid collection with peripheral enhancement suspicious for an abscess. There is asymmetric extension of peripherally enhancing fluid into the posterior aspect of the left greater sciatic notch. 2. Increased marrow T2 hyperintensity and enhancement within the adjacent mid to distal sacrum and coccyx suspicious for early osteomyelitis. 3. Presacral inflammatory changes extend into the adjacent piriformis and gluteus maximus muscles bilaterally with associated small intramuscular fluid collections. 4. No evidence of sacroiliitis or hip joint septic arthritis. Electronically Signed   By: Carey Bullocks M.D.   On: 09/19/2023 08:57   MR Lumbar Spine W Wo Contrast Result Date: 09/19/2023 CLINICAL DATA:  38 year old male with back pain radiating down the left leg. Dizziness, shortness of breath. History of perianal abscess. EXAM: MRI LUMBAR SPINE WITHOUT AND WITH CONTRAST TECHNIQUE: Multiplanar and multiecho pulse sequences of the lumbar spine were obtained without and with  intravenous contrast. CONTRAST:  9mL GADAVIST GADOBUTROL 1 MMOL/ML IV SOLN COMPARISON:  Thoracic MRI today reported separately. CT Abdomen and Pelvis 08/22/2023. Pelvis MRI today reported separately. FINDINGS: Segmentation:  Normal, concordant with the thoracic numbering today. Alignment: Stable to mildly improved lumbar lordosis compared to the CT last month. Vertebrae: Generalized nonspecific decreased bone marrow signal in the visible spine and pelvis. Stable lumbar vertebral height. No marrow edema identified in the lumbar spine. However, there is patchy abnormal  marrow edema and enhancement identified in the lower sacrum beginning at the S3 central vertebral level (series 8, image 8 and series 12, image 8). Upper sacral segments, upper sacral ala and SI joints appear spared. Conus medullaris and cauda equina: Conus better demonstrated on the thoracic MRI today reported separately. Minimally visible on these images. Cauda equina nerve roots appear to remain normal. No lumbar abnormal intrathecal enhancement or dural thickening. Sacral epidural lipomatosis. Paraspinal and other soft tissues: Negative lumbar paraspinal soft tissues. Visible abdominal viscera stable from the CT Abdomen and Pelvis last month including heterogeneous appearance of the kidneys. Partially visible abnormal presacral enhancement and loculated appearing fluid or edema (series 12, image 8 and series 8, image 8). This extends up to the ventral lower S2 sacral level. Disc levels: No age advanced lumbar spine degeneration or spinal stenosis is identified. IMPRESSION: 1. Evidence of lower Sacral Osteomyelitis with presacral Abscess, Phlegmon: Partially visible abnormal sacrum beginning at the S3 body with marrow edema and enhancement. And abnormal presacral soft tissue fluid, edema, and enhancement. See further details on Pelvis MRI today reported separately. 2. No acute or inflammatory process identified in the lumbar spine. No lumbar spinal  stenosis. Electronically Signed   By: Odessa Fleming M.D.   On: 09/19/2023 06:45   MR THORACIC SPINE W WO CONTRAST Result Date: 09/19/2023 CLINICAL DATA:  38 year old male with back pain radiating down the left leg. Dizziness, shortness of breath. History of perianal abscess. EXAM: MRI THORACIC WITHOUT AND WITH CONTRAST TECHNIQUE: Multiplanar and multiecho pulse sequences of the thoracic spine were obtained without and with intravenous contrast. CONTRAST:  9mL GADAVIST GADOBUTROL 1 MMOL/ML IV SOLN COMPARISON:  CTA chest 04/03/2023. FINDINGS: Limited cervical spine imaging:  Minimally included. Thoracic spine segmentation:  Normal on the comparison CTA. Alignment: Stable thoracic kyphosis compared to last year. No significant scoliosis or spondylolisthesis. Vertebrae: Generalized decreased T1 marrow signal, nonspecific. Bone mineralization within normal limits by CT last year. Stable, generally maintained vertebral body height. No marrow edema or evidence of acute osseous abnormality. No abnormal enhancement identified. Cord: No thoracic spinal cord signal abnormality. Maintained cord volume. Conus medullaris appears normal at T11-T12. No abnormal intradural enhancement. No dural thickening. Paraspinal and other soft tissues: Negative. Negative visualized posterior paraspinal soft tissues. Disc levels: Negative aside from T3-T4: Moderate to severe facet and/or ligament flavum hypertrophy on the right (series 20, image 14). Mild spinal stenosis. Moderate right T3 neural foraminal stenosis. T4-T5: Moderate similar right ligament flavum hypertrophy. Borderline to mild stenosis. T5-T6: Mild left side ligament flavum hypertrophy (series 20, image 22). No stenosis. T9-T10: Mild to moderate bilateral ligament flavum hypertrophy greater on the left (series 20, image 39). Mild spinal stenosis. Moderate to severe left and mild to moderate right T9 foraminal stenosis. T10-T11: Subtle posterior disc bulging. Mild to moderate facet  and/or ligament flavum hypertrophy. Mild spinal stenosis (series 20, image 45). No significant spinal cord mass effect. Mild to moderate bilateral T10 foraminal stenosis. T11-T12: Moderate facet hypertrophy greater on the left. No spinal stenosis but moderate to severe left T11 foraminal stenosis. T12-L1: Mild to moderate left ligament flavum hypertrophy. Moderate to severe left T12 foraminal stenosis. IMPRESSION: 1. No acute or inflammatory abnormality in the Thoracic Spine. 2. Multilevel thoracic spine degeneration chiefly in the form of posterior element hypertrophy. Isolated mild thoracic spinal stenosis at T10-T11 when combined with disc bulging there. No thoracic spinal cord mass effect or signal abnormality. Moderate to severe associated neural foraminal stenosis at the right T3, right  T9, left T11 and T12 nerve levels. Electronically Signed   By: Odessa Fleming M.D.   On: 09/19/2023 06:37   CT ABDOMEN PELVIS W CONTRAST Result Date: 08/22/2023 CLINICAL DATA:  Pt c.o left lower back pain with tingling in both legs since last week. LLQ abdominal pain also sacral pain, hx of osteo of coccyx EXAM: CT ABDOMEN AND PELVIS WITH CONTRAST TECHNIQUE: Multidetector CT imaging of the abdomen and pelvis was performed using the standard protocol following bolus administration of intravenous contrast. RADIATION DOSE REDUCTION: This exam was performed according to the departmental dose-optimization program which includes automated exposure control, adjustment of the mA and/or kV according to patient size and/or use of iterative reconstruction technique. CONTRAST:  65mL OMNIPAQUE IOHEXOL 350 MG/ML SOLN COMPARISON:  CT AP, 04/03/2023 and 01/23/2023. FINDINGS: Lower chest: No acute abnormality. Hepatobiliary: No focal liver abnormality. No gallstones, gallbladder wall thickening, or biliary dilatation. Pancreas: No pancreatic ductal dilatation or surrounding inflammatory changes. Spleen: Normal in size without focal abnormality.  Adrenals/Urinary Tract: Adrenal glands are unremarkable. BILATERAL renal cystic lesions, largest measuring 2.5 cm RIGHT and 3.8 cm LEFT. No dedicated follow-up is indicated. Additional sub-1 cm hypodensities are too small to characterize though likely additional cysts. Kidneys are otherwise normal, without renal calculi or hydronephrosis. Bladder is unremarkable. Stomach/Bowel: Stomach is within normal limits. Appendix appears normal. Nonobstructed small bowel. Nondilated colon. Similar appearance of drain seton at the anorectal verge. Decreased rectal mucosal thickening with mild residual perirectal stranding. LEFT-sided air and fluid tracking along the rectal wall, consistent with a persistent fistula-in-ano. See key image Vascular/Lymphatic: No significant vascular findings are present. No enlarged abdominal or pelvic lymph nodes. Reproductive: Prostate is unremarkable. Other: Small fat-containing RIGHT inguinal hernia versus cord lipoma. No abdominopelvic ascites. Musculoskeletal: No acute or significant osseous findings. IMPRESSION: Since CT AP dated 04/03/2023; 1. Decreased rectal mucosal thickening with mild residual peri-rectal stranding. 2. Small, LEFT-sided persistent anorectal fistula.  See key image. No focal drainable collection or abscess. Electronically Signed   By: Roanna Banning M.D.   On: 08/22/2023 12:51     Rexene Alberts, MSN, NP-C [facility] Pager: [phone]  09/19/2023 12:57 PM

## 2023-09-19 NOTE — Telephone Encounter (Signed)
Please see note below.  Please advise if I may book in one the the held appointments on your schedule. If so please advise on which date and time.

## 2023-09-19 NOTE — Consult Note (Signed)
Consult Note  Shawn [name] [birth year]  [id number].    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 He reports worsening of baseline low back pain over the last month. Denied fever, chills, bowel or bladder incontinence, numbness of lower extremities but does report some weakness of LE that is worse on the left. He reports ongoing drainage from fistula with seton in place with increase in amount over the last month. He is constipated and reports no BM for the last week. Denies nausea or vomiting. He last saw Dr. Michaell Cowing in the office in June 2024 and final seton was left in place. He was advised at that time on smoking cessation and need for GI follow up and appropriate biologic therapy for Crohn's. He had been on Stelara for Crohn's and underwent colonoscopy in August 2024 and has not seen GI since. He reports that he was told his Crohn's is in remission and he no longer needs medication for it, but I see no documentation to this effect in his chart.   PMH otherwise significant for HTN, CKD, dilated cardiomyopathy, ?hx of neuromuscular disorder and GERD. He is not on blood thinners. He is allergic to NSAIDS and shellfish.   ROS: Negative other than HPI  Family History  Problem Relation Age of Onset   Diabetes Mother    Kidney failure Mother    Diabetes Sister    Colon cancer Neg Hx    Stomach cancer Neg Hx    Esophageal cancer Neg Hx    Pancreatic disease Neg Hx    Colon polyps Neg Hx    Rectal cancer Neg Hx     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   PO B12 initiated 12/31/20   Anxiety    Arthritis    Chronic kidney disease    Crohn's colitis (HCC)     Dilated cardiomyopathy (HCC) 05/18/2013   By catheterization 2014    Fistula    GERD (gastroesophageal reflux disease)    Hypertension    Neuromuscular disorder (HCC)    Osteomyelitis (HCC)    tailbone    Past Surgical History:  Procedure Laterality Date   AV FISTULA REPAIR  07/07/2021   BIOPSY  01/01/2021   Procedure: BIOPSY;  Surgeon: Meryl Dare, MD;  Location: [facility] ENDOSCOPY;  Service: Endoscopy;;   COLONOSCOPY WITH PROPOFOL N/A 01/01/2021   Procedure: COLONOSCOPY WITH PROPOFOL;  Surgeon: Meryl Dare, MD;  Location: [facility] ENDOSCOPY;  Service: Endoscopy;  Laterality: N/A;   INCISION AND DRAINAGE ABSCESS N/A 07/07/2021   Procedure: MODIFIED HANLEY PROCEDURE, DRAINAGE WITH SETON PLACEMENT;  Surgeon: Karie Soda, MD;  Location: [facility] ORS;  Service: General;  Laterality: N/A;   LEFT AND RIGHT HEART CATHETERIZATION WITH CORONARY ANGIOGRAM N/A 11/21/2012   Procedure: LEFT AND RIGHT HEART CATHETERIZATION WITH CORONARY ANGIOGRAM;  Surgeon: Ricki Rodriguez, MD;  Location: [facility] CATH LAB;  Service: Cardiovascular;  Laterality: N/A;   MANDIBLE FRACTURE SURGERY  08/23/2004   PLACEMENT OF SETON N/A 07/07/2021   Procedure: PLACEMENT OF SETON;  Surgeon: Karie Soda, MD;  Location: [facility] ORS;  Service: General;  Laterality: N/A;   PLACEMENT OF SETON N/A 01/15/2022   Procedure: PLACEMENT OF SETON;  Surgeon: Karie Soda, MD;  Location: [facility] ORS;  Service: General;  Laterality: N/A;   RECTAL EXAM UNDER ANESTHESIA N/A 01/15/2022   Procedure: RECTAL EXAM UNDER ANESTHESIA;  Surgeon: Karie Soda, MD;  Location: [facility] ORS;  Service: General;  Laterality: N/A;  TRANSRECTAL DRAINAGE REPLACEMENT OF SETON EXCISION OF PERINEAL SINUS TRACT ANORECTAL EXAM UNDER ANESTHESIA    Social History:  reports that he has quit smoking. His smoking use included cigarettes. He has never used smokeless tobacco. He reports that he does not currently use alcohol. He reports that he does not use drugs.  Allergies:  Allergies   Allergen Reactions   Shrimp [Shellfish Allergy] Shortness Of Breath   Nsaids Other (See Comments)    CROHN'S DISEASE = NO NSAIDS    (Not in a hospital admission)   Blood pressure 122/61, pulse 81, temperature 97.8 F (36.6 C), temperature source Temporal, resp. rate 16, height 6\' 1"  (1.854 m), weight 93 kg, SpO2 100%. Physical Exam:  General: pleasant, WD, chronically ill appearing male who is laying in bed in NAD HEENT: head is normocephalic, atraumatic.  Sclera are noninjected.  EOMI.  Ears and nose without any masses or lesions.  Mouth is pink and moist Heart: regular, rate, and rhythm.   Lungs: CTAB, no wheezes, rhonchi, or rales noted.  Respiratory effort nonlabored Abd: soft, NT, ND GU: seton in place with purulent drainage  MS: all 4 extremities are symmetrical with no cyanosis, clubbing, or edema. Skin: warm and dry with no masses, lesions, or rashes Neuro: Cranial nerves 2-12 grossly intact, sensation is normal throughout Psych: A&Ox3 with an appropriate affect.   Results for orders placed or performed during the hospital encounter of 09/18/23 (from the past 48 hours)  CBC with Differential     Status: Abnormal   Collection Time: 09/18/23  5:12 PM  Result Value Ref Range   WBC 14.3 (H) 4.0 - 10.5 K/uL   RBC 3.09 (L) 4.22 - 5.81 MIL/uL   Hemoglobin 8.8 (L) 13.0 - 17.0 g/dL   HCT 16.1 (L) 09.6 - 04.5 %   MCV 84.1 80.0 - 100.0 fL   MCH 28.5 26.0 - 34.0 pg   MCHC 33.8 30.0 - 36.0 g/dL   RDW 40.9 (H) 81.1 - 91.4 %   Platelets 574 (H) 150 - 400 K/uL   nRBC 0.0 0.0 - 0.2 %   Neutrophils Relative % 70 %   Neutro Abs 10.0 (H) 1.7 - 7.7 K/uL   Lymphocytes Relative 22 %   Lymphs Abs 3.2 0.7 - 4.0 K/uL   Monocytes Relative 4 %   Monocytes Absolute 0.6 0.1 - 1.0 K/uL   Eosinophils Relative 3 %   Eosinophils Absolute 0.4 0.0 - 0.5 K/uL   Basophils Relative 0 %   Basophils Absolute 0.1 0.0 - 0.1 K/uL   Immature Granulocytes 1 %   Abs Immature Granulocytes 0.09 (H) 0.00 -  0.07 K/uL    Comment: Performed at [facility], [address], [place], [place] [address]  Basic metabolic panel     Status: Abnormal   Collection Time: 09/18/23  5:12 PM  Result Value Ref Range   Sodium 138 135 - 145 mmol/L   Potassium 4.2 3.5 - 5.1 mmol/L   Chloride 109 98 - 111 mmol/L   CO2 19 (L) 22 - 32 mmol/L   Glucose, Bld 106 (H) 70 - 99 mg/dL    Comment: Glucose reference range applies only to samples taken after fasting for at least  8 hours.   BUN 18 6 - 20 mg/dL   Creatinine, Ser 1.61 (H) 0.61 - 1.24 mg/dL   Calcium 8.7 (L) 8.9 - 10.3 mg/dL   GFR, Estimated 59 (L) >60 mL/min    Comment: (NOTE) Calculated using the CKD-EPI Creatinine Equation (2021)    Anion gap 10 5 - 15    Comment: Performed at [facility], [address] [place], [place] [address]  Urinalysis, Routine w reflex microscopic -Urine, Clean Catch     Status: None   Collection Time: 09/18/23  5:12 PM  Result Value Ref Range   Color, Urine YELLOW YELLOW   APPearance CLEAR CLEAR   Specific Gravity, Urine 1.012 1.005 - 1.030   pH 6.0 5.0 - 8.0   Glucose, UA NEGATIVE NEGATIVE mg/dL   Hgb urine dipstick NEGATIVE NEGATIVE   Bilirubin Urine NEGATIVE NEGATIVE   Ketones, ur NEGATIVE NEGATIVE mg/dL   Protein, ur NEGATIVE NEGATIVE mg/dL   Nitrite NEGATIVE NEGATIVE   Leukocytes,Ua NEGATIVE NEGATIVE    Comment: Performed at [facility], [address] [place], [place] [address]  Rapid urine drug screen (hospital performed)     Status: None   Collection Time: 09/18/23  5:21 PM  Result Value Ref Range   Opiates NONE DETECTED NONE DETECTED   Cocaine NONE DETECTED NONE DETECTED   Benzodiazepines NONE DETECTED NONE DETECTED   Amphetamines NONE DETECTED NONE DETECTED   Tetrahydrocannabinol NONE DETECTED NONE DETECTED   Barbiturates NONE DETECTED NONE DETECTED    Comment: (NOTE) DRUG SCREEN FOR MEDICAL PURPOSES ONLY.  IF CONFIRMATION IS NEEDED FOR ANY PURPOSE, NOTIFY LAB WITHIN 5  DAYS.  LOWEST DETECTABLE LIMITS FOR URINE DRUG SCREEN Drug Class                     Cutoff (ng/mL) Amphetamine and metabolites    1000 Barbiturate and metabolites    200 Benzodiazepine                 200 Opiates and metabolites        300 Cocaine and metabolites        300 THC                            50 Performed at [facility], [address], [place], [place] [address]   Sedimentation rate     Status: Abnormal   Collection Time: 09/19/23  4:02 AM  Result Value Ref Range   Sed Rate 110 (H) 0 - 16 mm/hr    Comment: Performed at [facility], [address], [place], [place] [address]  C-reactive protein     Status: Abnormal   Collection Time: 09/19/23  4:02 AM  Result Value Ref Range   CRP 14.3 (H) <1.0 mg/dL    Comment: Performed at [facility], [address] [place], [place] [address]   MR PELVIS W WO CONTRAST Result Date: 09/19/2023 CLINICAL DATA:  Low back pain. History of perianal abscess/fistula. Clinical concern for osteomyelitis. EXAM: MRI PELVIS WITHOUT AND WITH CONTRAST TECHNIQUE: Multiplanar multisequence MR imaging of the pelvis was performed both before and after administration of intravenous contrast. CONTRAST:  9mL GADAVIST GADOBUTROL 1 MMOL/ML IV SOLN COMPARISON:  MRI pelvis 05/16/2021, CT pelvis 08/22/2023 and lumbar MRI 09/19/2023. FINDINGS: Bones/Joint/Cartilage There are chronic perirectal and presacral inflammatory changes which have progressed from previous MRI. Recent CT demonstrated a perianal fistula  with seton tube in place. A presacral fluid collection with a small amount of internal air and heterogeneous surrounding enhancement appears enlarged from the most recent CT, measuring up to 7.3 x 4.0 x 1.5 cm. Appearance is suspicious for a presacral abscess. There is increased marrow T2 hyperintensity and enhancement within the adjacent mid to distal sacrum and coccyx suspicious for early osteomyelitis. No gross cortical destruction  identified. The sacroiliac joints appear intact. No significant hip joint effusion or abnormal synovial enhancement. The remainder of the bony pelvis appears intact. Ligaments No significant ligamentous abnormalities are identified. Muscles and Tendons Presacral inflammatory changes extend into the adjacent piriformis and gluteus maximus muscles bilaterally. There are associated small intramuscular fluid collections with peripheral enhancement. The common hamstring and iliopsoas tendons appear unremarkable. Soft tissue As above, enlarging complex presacral fluid collection with peripheral enhancement suspicious for an abscess. There is asymmetric extension of peripherally enhancing fluid into the posterior aspect of the left greater sciatic notch, measuring 2.0 x 1.3 x 1.3 cm. Grossly stable underlying rectal wall thickening. IMPRESSION: 1. Enlarging presacral fluid collection with peripheral enhancement suspicious for an abscess. There is asymmetric extension of peripherally enhancing fluid into the posterior aspect of the left greater sciatic notch. 2. Increased marrow T2 hyperintensity and enhancement within the adjacent mid to distal sacrum and coccyx suspicious for early osteomyelitis. 3. Presacral inflammatory changes extend into the adjacent piriformis and gluteus maximus muscles bilaterally with associated small intramuscular fluid collections. 4. No evidence of sacroiliitis or hip joint septic arthritis. Electronically Signed   By: Carey Bullocks M.D.   On: 09/19/2023 08:57   MR Lumbar Spine W Wo Contrast Result Date: 09/19/2023 CLINICAL DATA:  38 year old male with back pain radiating down the left leg. Dizziness, shortness of breath. History of perianal abscess. EXAM: MRI LUMBAR SPINE WITHOUT AND WITH CONTRAST TECHNIQUE: Multiplanar and multiecho pulse sequences of the lumbar spine were obtained without and with intravenous contrast. CONTRAST:  9mL GADAVIST GADOBUTROL 1 MMOL/ML IV SOLN COMPARISON:   Thoracic MRI today reported separately. CT Abdomen and Pelvis 08/22/2023. Pelvis MRI today reported separately. FINDINGS: Segmentation:  Normal, concordant with the thoracic numbering today. Alignment: Stable to mildly improved lumbar lordosis compared to the CT last month. Vertebrae: Generalized nonspecific decreased bone marrow signal in the visible spine and pelvis. Stable lumbar vertebral height. No marrow edema identified in the lumbar spine. However, there is patchy abnormal marrow edema and enhancement identified in the lower sacrum beginning at the S3 central vertebral level (series 8, image 8 and series 12, image 8). Upper sacral segments, upper sacral ala and SI joints appear spared. Conus medullaris and cauda equina: Conus better demonstrated on the thoracic MRI today reported separately. Minimally visible on these images. Cauda equina nerve roots appear to remain normal. No lumbar abnormal intrathecal enhancement or dural thickening. Sacral epidural lipomatosis. Paraspinal and other soft tissues: Negative lumbar paraspinal soft tissues. Visible abdominal viscera stable from the CT Abdomen and Pelvis last month including heterogeneous appearance of the kidneys. Partially visible abnormal presacral enhancement and loculated appearing fluid or edema (series 12, image 8 and series 8, image 8). This extends up to the ventral lower S2 sacral level. Disc levels: No age advanced lumbar spine degeneration or spinal stenosis is identified. IMPRESSION: 1. Evidence of lower Sacral Osteomyelitis with presacral Abscess, Phlegmon: Partially visible abnormal sacrum beginning at the S3 body with marrow edema and enhancement. And abnormal presacral soft tissue fluid, edema, and enhancement. See further details on Pelvis MRI today reported  separately. 2. No acute or inflammatory process identified in the lumbar spine. No lumbar spinal stenosis. Electronically Signed   By: Odessa Fleming M.D.   On: 09/19/2023 06:45   MR THORACIC  SPINE W WO CONTRAST Result Date: 09/19/2023 CLINICAL DATA:  38 year old male with back pain radiating down the left leg. Dizziness, shortness of breath. History of perianal abscess. EXAM: MRI THORACIC WITHOUT AND WITH CONTRAST TECHNIQUE: Multiplanar and multiecho pulse sequences of the thoracic spine were obtained without and with intravenous contrast. CONTRAST:  9mL GADAVIST GADOBUTROL 1 MMOL/ML IV SOLN COMPARISON:  CTA chest 04/03/2023. FINDINGS: Limited cervical spine imaging:  Minimally included. Thoracic spine segmentation:  Normal on the comparison CTA. Alignment: Stable thoracic kyphosis compared to last year. No significant scoliosis or spondylolisthesis. Vertebrae: Generalized decreased T1 marrow signal, nonspecific. Bone mineralization within normal limits by CT last year. Stable, generally maintained vertebral body height. No marrow edema or evidence of acute osseous abnormality. No abnormal enhancement identified. Cord: No thoracic spinal cord signal abnormality. Maintained cord volume. Conus medullaris appears normal at T11-T12. No abnormal intradural enhancement. No dural thickening. Paraspinal and other soft tissues: Negative. Negative visualized posterior paraspinal soft tissues. Disc levels: Negative aside from T3-T4: Moderate to severe facet and/or ligament flavum hypertrophy on the right (series 20, image 14). Mild spinal stenosis. Moderate right T3 neural foraminal stenosis. T4-T5: Moderate similar right ligament flavum hypertrophy. Borderline to mild stenosis. T5-T6: Mild left side ligament flavum hypertrophy (series 20, image 22). No stenosis. T9-T10: Mild to moderate bilateral ligament flavum hypertrophy greater on the left (series 20, image 39). Mild spinal stenosis. Moderate to severe left and mild to moderate right T9 foraminal stenosis. T10-T11: Subtle posterior disc bulging. Mild to moderate facet and/or ligament flavum hypertrophy. Mild spinal stenosis (series 20, image 45). No  significant spinal cord mass effect. Mild to moderate bilateral T10 foraminal stenosis. T11-T12: Moderate facet hypertrophy greater on the left. No spinal stenosis but moderate to severe left T11 foraminal stenosis. T12-L1: Mild to moderate left ligament flavum hypertrophy. Moderate to severe left T12 foraminal stenosis. IMPRESSION: 1. No acute or inflammatory abnormality in the Thoracic Spine. 2. Multilevel thoracic spine degeneration chiefly in the form of posterior element hypertrophy. Isolated mild thoracic spinal stenosis at T10-T11 when combined with disc bulging there. No thoracic spinal cord mass effect or signal abnormality. Moderate to severe associated neural foraminal stenosis at the right T3, right T9, left T11 and T12 nerve levels. Electronically Signed   By: Odessa Fleming M.D.   On: 09/19/2023 06:37      Assessment/Plan Crohn's colitis with anorectal fistula and seton in place Pre-sacral abscess and osteomyelitis  - agree with medical admission and IV abx - WBC 14.3, afebrile and HD stable  - seton in place with purulent drainage around - no external cellulitis  - MR with above - given location surgical drainage is not currently recommended, I have placed IR consult for image guided drainage  - consider ID consult since they helped with abx for previous osteomyelitis  - pt has been off stelara and reports he was told his Crohn's is in remission - I do not see documentation of this from GI  FEN: NPO, IVF per primary team  VTE: SQH ID: Vanc/cefepime; I have ordered Zosyn given ongoing abscess  - per admitting team -  Tobacco abuse  HTN  ?Neuromuscular disorder CKD stage III Dilated cardiomyopathy  GERD  I reviewed ED provider notes, last 24 h vitals and pain scores, last 48 h  intake and output, last 24 h labs and trends, last 24 h imaging results, and previous general surgery and GI notes and procedures .  This care required high  level of medical decision making.   Juliet Rude, [facility] 09/19/2023, 10:55 AM Please see Amion for pager number during day hours 7:00am-4:30pm

## 2023-09-19 NOTE — Progress Notes (Signed)
Report given to  RN via internal text system. No questions at this time, pt is no new onset distress at this time.

## 2023-09-19 NOTE — Hospital Course (Addendum)
Shawn [name] is a 38 y.o. M with PMH Crohn's colitis with anorectal fistula, prior coccyx osteomyelitis, macrocytic anemia, CKD stage 3a, who presented with 1 month of low back pain admitted for presacral and left gluteal abscesses and sacral osteomyelitis.   # Osteomyelitis of sacrum # Left gluteal abscess  # Presacral abscess  # Hx of coccyx osteomyelitis # Crohn's disease with anorectal fistula # Chron's proctitis  He presented with worsening perianal pain with drainage. He had low grade fever, leukocytosis, and elevated inflammatory markers. Imaging with CT and MRI showed a deep gluteal abscess and concern for sacral osteomyelitis. We consulted ID/IR/GI. Started on daptomycin, CTX, metronidazole. IR drained gluteal abscess. Cx from the IR aspirate with no growth. Repeat CT A/P did not show further drainage collections after discussion with IR team. Plan to continue prolonged IV abx therapy with EOT 11/01/23. Has ID outpt f/u.   Notably, he was previously on Stelara injections that helped control his chron's disease throughout spring and summer 2024. He discontinued the medication of his own volition as he was under the impression that his Crohn's disease was in remission and he no longer required any treatment.  Given the complicated abscess, he will need to restart stelara outpatient with GI once current infection improves/resolves. Hepatitis B immunity and pending QuantiFERON gold. Scheduled with Dr. Doy Hutching (GI) on 2/18. Received hydrocortisone suppository w lidocaine jelly. Sent short course of oxycodone 10 mg Q8H PRN for pain with senna daily.  - Outpatient Abx regimen: daptomycin 700 mg daily, ceftriaxone 2 g daily and flagyl 500 mg BID (EOT 11/01/23) - follow up QuantiFERON gold   # Acute on chronic anemia. # History of vit B12 deficiency. Hb 8.8 on arrival and stayed stable with 8.7 at d/c. Baseline Hb 10-11. No signs of acutely bleeding. Iron panel consistent with anemia of chronic  disease. Vitamin B12 low with being on oral supplementation, question absorption with Crohn's. He received IM B12 injection on 1/30 with plans for weekly until PCP follow up on 2/14.   # CKD 3A   SCr 1.8 on arrival, improved to baseline (1.5-1.7) at time of discharge with adequate po hydration.

## 2023-09-19 NOTE — ED Notes (Signed)
Pt ambulated independently to the restroom.

## 2023-09-19 NOTE — Progress Notes (Signed)
ED Pharmacy Antibiotic Sign Off An antibiotic consult was received from an ED provider for vancomycin and cefepime per pharmacy dosing for osteomyelitis. A chart review was completed to assess appropriateness.   The following one time order(s) were placed:  Vancomycin 2000mg  IV x1 cefepime  2000mg  IV x1   Further antibiotic and/or antibiotic pharmacy consults should be ordered by the admitting provider if indicated.   Thank you for allowing pharmacy to be a part of this patient's care.   Smitty Cords, [facility]  Clinical Pharmacist 09/19/23 7:44 AM

## 2023-09-19 NOTE — ED Provider Notes (Signed)
[place] EMERGENCY DEPARTMENT AT [facility] Provider Note   CSN: [id number] Arrival date & time: 09/18/23  1544     History  Chief Complaint  Patient presents with   Dizziness   Back Pain    Shawn [name] is a 38 y.o. male.  The history is provided by the patient.  Dizziness Back Pain He has history of hypertension, chronic kidney disease, Crohn's disease, dilated cardiomyopathy, anorectal fistula and has been having low back pain for several months but worse over the last month.  Pain initially radiated down both legs, now is just radiating down his left leg.  He denies numbness or tingling.  However, he has noted he is getting weak to where it is difficult to walk.  He feels like he is off balance.  He denies any bowel or bladder dysfunction.  He denies any paresthesias.  He does have an MRI scheduled for 4 days from today.  He has been taking oxycodone-acetaminophen for pain, but has had problems with and making him feel weak and constipated.   Home Medications Prior to Admission medications   Medication Sig Start Date End Date Taking? Authorizing Provider  acetaminophen (TYLENOL) 500 MG tablet Take 2 tablets (1,000 mg total) by mouth every 8 (eight) hours as needed for moderate pain. 02/18/23   Glyn Ade, MD  lidocaine (LIDODERM) 5 % Place 1 patch onto the skin daily. Remove & Discard patch within 12 hours or as directed by MD 02/18/23   Glyn Ade, MD  metroNIDAZOLE (FLAGYL) 500 MG tablet Take 1 tablet (500 mg total) by mouth 2 (two) times daily. 04/03/23   Small, Brooke L, PA  multivitamin-iron-minerals-folic acid (CENTRUM) chewable tablet Chew 1 tablet by mouth daily. 07/10/21   Karie Soda, MD  oxyCODONE-acetaminophen (PERCOCET/ROXICET) 5-325 MG tablet Take 1-2 tablets by mouth every 6 (six) hours as needed for severe pain (pain score 7-10). 08/22/23   Rolan Bucco, MD  tiZANidine (ZANAFLEX) 4 MG tablet Take 1 tablet (4 mg total) by mouth every 8  (eight) hours as needed. 02/18/23   Glyn Ade, MD  ustekinumab (STELARA) 90 MG/ML SOSY injection Inject 1 mL (90 mg total) into the skin every 8 (eight) weeks. no further refills unless appt is kept 01/19/23   Meryl Dare, MD  vitamin B-12 1000 MCG tablet Take 1 tablet (1,000 mcg total) by mouth daily. 07/10/21   Karie Soda, MD      Allergies    Shrimp [shellfish allergy] and Nsaids    Review of Systems   Review of Systems  Musculoskeletal:  Positive for back pain.  Neurological:  Positive for dizziness.  All other systems reviewed and are negative.   Physical Exam Updated Vital Signs BP 125/78 (BP Location: Right Arm)   Pulse 91   Temp 98 F (36.7 C) (Oral)   Resp 16   Ht 6\' 1"  (1.854 m)   Wt 93 kg   SpO2 98%   BMI 27.05 kg/m  Physical Exam Vitals and nursing note reviewed.   38 year old male, resting comfortably and in no acute distress. Vital signs are normal. Oxygen saturation is 98%, which is normal. Head is normocephalic and atraumatic. PERRLA, EOMI. Oropharynx is clear. Neck is nontender and supple without adenopathy. Back is has fairly localized tenderness at approximately T9-T10, and also in the sacral region.  There is moderate bilateral paralumbar spasm.  Straight leg raise is positive bilaterally at 45 degrees. Lungs are clear without rales, wheezes, or rhonchi. Chest  is nontender. Heart has regular rate and rhythm without murmur. Abdomen is soft, flat, nontender. Extremities have no cyanosis or edema, full range of motion is present. Skin is warm and dry without rash. Neurologic: Mental status is normal, cranial nerves are intact, strength is 5/5 in all 4 extremities, sensory exam is normal.  ED Results / Procedures / Treatments   Labs (all labs ordered are listed, but only abnormal results are displayed) Labs Reviewed  CBC WITH DIFFERENTIAL/PLATELET - Abnormal; Notable for the following components:      Result Value   WBC 14.3 (*)    RBC  3.09 (*)    Hemoglobin 8.8 (*)    HCT 26.0 (*)    RDW 17.3 (*)    Platelets 574 (*)    Neutro Abs 10.0 (*)    Abs Immature Granulocytes 0.09 (*)    All other components within normal limits  BASIC METABOLIC PANEL - Abnormal; Notable for the following components:   CO2 19 (*)    Glucose, Bld 106 (*)    Creatinine, Ser 1.52 (*)    Calcium 8.7 (*)    GFR, Estimated 59 (*)    All other components within normal limits  SEDIMENTATION RATE - Abnormal; Notable for the following components:   Sed Rate 110 (*)    All other components within normal limits  C-REACTIVE PROTEIN - Abnormal; Notable for the following components:   CRP 14.3 (*)    All other components within normal limits  URINALYSIS, ROUTINE W REFLEX MICROSCOPIC  RAPID URINE DRUG SCREEN, HOSP PERFORMED    EKG EKG Interpretation Date/Time:  Sunday September 18 2023 17:26:09 EST Ventricular Rate:  84 PR Interval:  134 QRS Duration:  82 QT Interval:  364 QTC Calculation: 430 R Axis:   76  Text Interpretation: Normal sinus rhythm Minimal voltage criteria for LVH, may be normal variant ( Sokolow-Lyon ) Nonspecific T wave abnormality Abnormal ECG When compared with ECG of 03-Apr-2023 18:59, Nonspecific T wave abnormality is now present Confirmed by Dione Booze ([id number]) on 09/19/2023 12:05:44 AM  Radiology MR Lumbar Spine W Wo Contrast Result Date: 09/19/2023 CLINICAL DATA:  38 year old male with back pain radiating down the left leg. Dizziness, shortness of breath. History of perianal abscess. EXAM: MRI LUMBAR SPINE WITHOUT AND WITH CONTRAST TECHNIQUE: Multiplanar and multiecho pulse sequences of the lumbar spine were obtained without and with intravenous contrast. CONTRAST:  9mL GADAVIST GADOBUTROL 1 MMOL/ML IV SOLN COMPARISON:  Thoracic MRI today reported separately. CT Abdomen and Pelvis 08/22/2023. Pelvis MRI today reported separately. FINDINGS: Segmentation:  Normal, concordant with the thoracic numbering today. Alignment: Stable  to mildly improved lumbar lordosis compared to the CT last month. Vertebrae: Generalized nonspecific decreased bone marrow signal in the visible spine and pelvis. Stable lumbar vertebral height. No marrow edema identified in the lumbar spine. However, there is patchy abnormal marrow edema and enhancement identified in the lower sacrum beginning at the S3 central vertebral level (series 8, image 8 and series 12, image 8). Upper sacral segments, upper sacral ala and SI joints appear spared. Conus medullaris and cauda equina: Conus better demonstrated on the thoracic MRI today reported separately. Minimally visible on these images. Cauda equina nerve roots appear to remain normal. No lumbar abnormal intrathecal enhancement or dural thickening. Sacral epidural lipomatosis. Paraspinal and other soft tissues: Negative lumbar paraspinal soft tissues. Visible abdominal viscera stable from the CT Abdomen and Pelvis last month including heterogeneous appearance of the kidneys. Partially visible abnormal presacral enhancement and  loculated appearing fluid or edema (series 12, image 8 and series 8, image 8). This extends up to the ventral lower S2 sacral level. Disc levels: No age advanced lumbar spine degeneration or spinal stenosis is identified. IMPRESSION: 1. Evidence of lower Sacral Osteomyelitis with presacral Abscess, Phlegmon: Partially visible abnormal sacrum beginning at the S3 body with marrow edema and enhancement. And abnormal presacral soft tissue fluid, edema, and enhancement. See further details on Pelvis MRI today reported separately. 2. No acute or inflammatory process identified in the lumbar spine. No lumbar spinal stenosis. Electronically Signed   By: Odessa Fleming M.D.   On: 09/19/2023 06:45   MR THORACIC SPINE W WO CONTRAST Result Date: 09/19/2023 CLINICAL DATA:  38 year old male with back pain radiating down the left leg. Dizziness, shortness of breath. History of perianal abscess. EXAM: MRI THORACIC  WITHOUT AND WITH CONTRAST TECHNIQUE: Multiplanar and multiecho pulse sequences of the thoracic spine were obtained without and with intravenous contrast. CONTRAST:  9mL GADAVIST GADOBUTROL 1 MMOL/ML IV SOLN COMPARISON:  CTA chest 04/03/2023. FINDINGS: Limited cervical spine imaging:  Minimally included. Thoracic spine segmentation:  Normal on the comparison CTA. Alignment: Stable thoracic kyphosis compared to last year. No significant scoliosis or spondylolisthesis. Vertebrae: Generalized decreased T1 marrow signal, nonspecific. Bone mineralization within normal limits by CT last year. Stable, generally maintained vertebral body height. No marrow edema or evidence of acute osseous abnormality. No abnormal enhancement identified. Cord: No thoracic spinal cord signal abnormality. Maintained cord volume. Conus medullaris appears normal at T11-T12. No abnormal intradural enhancement. No dural thickening. Paraspinal and other soft tissues: Negative. Negative visualized posterior paraspinal soft tissues. Disc levels: Negative aside from T3-T4: Moderate to severe facet and/or ligament flavum hypertrophy on the right (series 20, image 14). Mild spinal stenosis. Moderate right T3 neural foraminal stenosis. T4-T5: Moderate similar right ligament flavum hypertrophy. Borderline to mild stenosis. T5-T6: Mild left side ligament flavum hypertrophy (series 20, image 22). No stenosis. T9-T10: Mild to moderate bilateral ligament flavum hypertrophy greater on the left (series 20, image 39). Mild spinal stenosis. Moderate to severe left and mild to moderate right T9 foraminal stenosis. T10-T11: Subtle posterior disc bulging. Mild to moderate facet and/or ligament flavum hypertrophy. Mild spinal stenosis (series 20, image 45). No significant spinal cord mass effect. Mild to moderate bilateral T10 foraminal stenosis. T11-T12: Moderate facet hypertrophy greater on the left. No spinal stenosis but moderate to severe left T11 foraminal  stenosis. T12-L1: Mild to moderate left ligament flavum hypertrophy. Moderate to severe left T12 foraminal stenosis. IMPRESSION: 1. No acute or inflammatory abnormality in the Thoracic Spine. 2. Multilevel thoracic spine degeneration chiefly in the form of posterior element hypertrophy. Isolated mild thoracic spinal stenosis at T10-T11 when combined with disc bulging there. No thoracic spinal cord mass effect or signal abnormality. Moderate to severe associated neural foraminal stenosis at the right T3, right T9, left T11 and T12 nerve levels. Electronically Signed   By: Odessa Fleming M.D.   On: 09/19/2023 06:37    Procedures Procedures    Medications Ordered in ED Medications - No data to display  ED Course/ Medical Decision Making/ A&P                                 Medical Decision Making Amount and/or Complexity of Data Reviewed Labs: ordered. Radiology: ordered.  Risk Prescription drug management. Decision regarding hospitalization.   Low back pain inpatient with known anorectal fistula  and Crohn's disease, no evidence of neurologic compromise.  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  I am concerned about recurrent osteomyelitis or discitis given point tenderness-especially in the thoracic region.  He was scheduled to have MRI lumbar spine and pelvis with and without contrast.  I have ordered those for today as well as MRI of the thoracic spine with and without contrast.  I have reviewed his laboratory tests, and my interpretation is stable renal insufficiency, anemia which has progressed with hemoglobin dropping from 11.0 on 08/22/2023 to 8.8 today.  Thrombocytosis is noted in this felt to be reactive.  Leukocytosis is present without left shift.  I have ordered morphine for pain.  CRP and sedimentation rate are both significantly elevated.  MRI of thoracic spine shows no acute or inflammatory  abnormality.  MRI of the lumbar spine shows evidence of lower sacral osteomyelitis with presacral abscess and phlegmon.  I have independently viewed these images, and agree with the radiologist's interpretation.  MRI of the pelvis is completed with reading pending.  I have ordered antibiotics of vancomycin and cefepime.  He had inadequate relief of pain with morphine and I have ordered hydromorphone.  He will need to be admitted for IV antibiotics, surgical consultation.  I have put out a page for hospitalist to arrange for hospital bed.  Final Clinical Impression(s) / ED Diagnoses Final diagnoses:  Osteomyelitis of sacrum (HCC)  Renal insufficiency  Elevated random blood glucose level  Normochromic normocytic anemia  Thrombocytosis    Rx / DC Orders ED Discharge Orders     None         Dione Booze, MD 09/19/23 [id number]

## 2023-09-19 NOTE — ED Notes (Signed)
Phlebotomy to collect blood work

## 2023-09-20 ENCOUNTER — Inpatient Hospital Stay (HOSPITAL_COMMUNITY): Payer: Medicaid Other

## 2023-09-20 DIAGNOSIS — M4628 Osteomyelitis of vertebra, sacral and sacrococcygeal region: Secondary | ICD-10-CM | POA: Diagnosis not present

## 2023-09-20 DIAGNOSIS — K604 Rectal fistula, unspecified: Secondary | ICD-10-CM | POA: Diagnosis not present

## 2023-09-20 DIAGNOSIS — D649 Anemia, unspecified: Secondary | ICD-10-CM | POA: Insufficient documentation

## 2023-09-20 DIAGNOSIS — L0231 Cutaneous abscess of buttock: Secondary | ICD-10-CM | POA: Diagnosis not present

## 2023-09-20 DIAGNOSIS — K50113 Crohn's disease of large intestine with fistula: Secondary | ICD-10-CM | POA: Diagnosis not present

## 2023-09-20 LAB — BASIC METABOLIC PANEL
Anion gap: 9 (ref 5–15)
BUN: 15 mg/dL (ref 6–20)
CO2: 22 mmol/L (ref 22–32)
Calcium: 8.4 mg/dL — ABNORMAL LOW (ref 8.9–10.3)
Chloride: 104 mmol/L (ref 98–111)
Creatinine, Ser: 1.67 mg/dL — ABNORMAL HIGH (ref 0.61–1.24)
GFR, Estimated: 53 mL/min — ABNORMAL LOW (ref >60)
Glucose, Bld: 99 mg/dL (ref 70–99)
Potassium: 4.8 mmol/L (ref 3.5–5.1)
Sodium: 135 mmol/L (ref 135–145)

## 2023-09-20 LAB — C-REACTIVE PROTEIN: CRP: 18.8 mg/dL — ABNORMAL HIGH (ref <1.0)

## 2023-09-20 LAB — CBC
HCT: 24.1 % — ABNORMAL LOW (ref 39.0–52.0)
Hemoglobin: 8 g/dL — ABNORMAL LOW (ref 13.0–17.0)
MCH: 28.4 pg (ref 26.0–34.0)
MCHC: 33.2 g/dL (ref 30.0–36.0)
MCV: 85.5 fL (ref 80.0–100.0)
Platelets: 536 10*3/uL — ABNORMAL HIGH (ref 150–400)
RBC: 2.82 MIL/uL — ABNORMAL LOW (ref 4.22–5.81)
RDW: 17.3 % — ABNORMAL HIGH (ref 11.5–15.5)
WBC: 15.1 10*3/uL — ABNORMAL HIGH (ref 4.0–10.5)
nRBC: 0 % (ref 0.0–0.2)

## 2023-09-20 LAB — SEDIMENTATION RATE: Sed Rate: 137 mm/h — ABNORMAL HIGH (ref 0–16)

## 2023-09-20 MED ORDER — HEPARIN SODIUM (PORCINE) 5000 UNIT/ML IJ SOLN
5000.0000 [IU] | Freq: Three times a day (TID) | INTRAMUSCULAR | Status: DC
  Administered 2023-09-20 – 2023-09-26 (×18): 5000 [IU] via SUBCUTANEOUS
  Filled 2023-09-20 (×18): qty 1

## 2023-09-20 MED ORDER — MIDAZOLAM HCL 2 MG/2ML IJ SOLN
INTRAMUSCULAR | Status: AC | PRN
  Administered 2023-09-20: 1 mg via INTRAVENOUS

## 2023-09-20 MED ORDER — METRONIDAZOLE 500 MG PO TABS
500.0000 mg | ORAL_TABLET | Freq: Two times a day (BID) | ORAL | Status: DC
  Administered 2023-09-20 – 2023-09-26 (×12): 500 mg via ORAL
  Filled 2023-09-20 (×12): qty 1

## 2023-09-20 MED ORDER — MENTHOL 3 MG MT LOZG: 1.0000 | LOZENGE | OROMUCOSAL | Status: DC | PRN

## 2023-09-20 MED ORDER — LIDOCAINE-EPINEPHRINE 1 %-1:100000 IJ SOLN
20.0000 mL | Freq: Once | INTRAMUSCULAR | Status: DC
  Filled 2023-09-20: qty 20

## 2023-09-20 MED ORDER — ALUM & MAG HYDROXIDE-SIMETH 200-200-20 MG/5ML PO SUSP: 30.0000 mL | Freq: Four times a day (QID) | ORAL | Status: DC | PRN

## 2023-09-20 MED ORDER — OXYCODONE HCL 5 MG PO TABS
5.0000 mg | ORAL_TABLET | ORAL | Status: DC | PRN
  Administered 2023-09-20 – 2023-09-24 (×8): 10 mg via ORAL
  Filled 2023-09-20 (×10): qty 2

## 2023-09-20 MED ORDER — POLYETHYLENE GLYCOL 3350 17 G PO PACK
17.0000 g | PACK | Freq: Every day | ORAL | Status: DC
  Administered 2023-09-20: 17 g via ORAL
  Filled 2023-09-20: qty 1

## 2023-09-20 MED ORDER — FENTANYL CITRATE (PF) 100 MCG/2ML IJ SOLN
INTRAMUSCULAR | Status: AC
  Filled 2023-09-20: qty 2

## 2023-09-20 MED ORDER — NAPHAZOLINE-GLYCERIN 0.012-0.25 % OP SOLN: 1.0000 [drp] | Freq: Four times a day (QID) | OPHTHALMIC | Status: DC | PRN

## 2023-09-20 MED ORDER — PHENOL 1.4 % MT LIQD: 2.0000 | OROMUCOSAL | Status: DC | PRN

## 2023-09-20 MED ORDER — FENTANYL CITRATE (PF) 100 MCG/2ML IJ SOLN
INTRAMUSCULAR | Status: AC | PRN
  Administered 2023-09-20: 50 ug via INTRAVENOUS
  Administered 2023-09-20: 25 ug via INTRAVENOUS

## 2023-09-20 MED ORDER — METHOCARBAMOL 500 MG PO TABS
1000.0000 mg | ORAL_TABLET | Freq: Four times a day (QID) | ORAL | Status: DC | PRN
  Administered 2023-09-21: 1000 mg via ORAL
  Filled 2023-09-20 (×5): qty 2

## 2023-09-20 MED ORDER — SODIUM CHLORIDE 0.9 % IV SOLN
2.0000 g | INTRAVENOUS | Status: DC
  Administered 2023-09-20 – 2023-09-26 (×7): 2 g via INTRAVENOUS
  Filled 2023-09-20 (×7): qty 20

## 2023-09-20 MED ORDER — DIPHENHYDRAMINE HCL 50 MG/ML IJ SOLN: 12.5000 mg | Freq: Four times a day (QID) | INTRAMUSCULAR | Status: DC | PRN

## 2023-09-20 MED ORDER — SALINE SPRAY 0.65 % NA SOLN: 1.0000 | Freq: Four times a day (QID) | NASAL | Status: DC | PRN

## 2023-09-20 MED ORDER — DAPTOMYCIN-SODIUM CHLORIDE 700-0.9 MG/100ML-% IV SOLN
8.0000 mg/kg | Freq: Every day | INTRAVENOUS | Status: DC
Start: 2023-09-20 — End: 2023-09-27
  Administered 2023-09-20 – 2023-09-26 (×7): 700 mg via INTRAVENOUS
  Filled 2023-09-20 (×7): qty 100

## 2023-09-20 MED ORDER — MIDAZOLAM HCL 2 MG/2ML IJ SOLN
INTRAMUSCULAR | Status: AC
  Filled 2023-09-20: qty 2

## 2023-09-20 MED ORDER — CALCIUM POLYCARBOPHIL 625 MG PO TABS
625.0000 mg | ORAL_TABLET | Freq: Two times a day (BID) | ORAL | Status: DC
  Administered 2023-09-20 – 2023-09-22 (×6): 625 mg via ORAL
  Filled 2023-09-20 (×10): qty 1

## 2023-09-20 MED ORDER — DIAZEPAM 5 MG PO TABS
5.0000 mg | ORAL_TABLET | Freq: Three times a day (TID) | ORAL | Status: DC | PRN
  Administered 2023-09-22: 5 mg via ORAL
  Filled 2023-09-20: qty 1

## 2023-09-20 MED ORDER — MAGIC MOUTHWASH: 15.0000 mL | Freq: Four times a day (QID) | ORAL | Status: DC | PRN

## 2023-09-20 MED ORDER — ACETAMINOPHEN 500 MG PO TABS
1000.0000 mg | ORAL_TABLET | Freq: Four times a day (QID) | ORAL | Status: DC
  Administered 2023-09-20 – 2023-09-26 (×18): 1000 mg via ORAL
  Filled 2023-09-20 (×20): qty 2

## 2023-09-20 MED ORDER — SIMETHICONE 40 MG/0.6ML PO SUSP: 80.0000 mg | Freq: Four times a day (QID) | ORAL | Status: DC | PRN

## 2023-09-20 MED ORDER — LACTATED RINGERS IV BOLUS: 1000.0000 mL | Freq: Three times a day (TID) | INTRAVENOUS | Status: AC | PRN

## 2023-09-20 NOTE — Procedures (Signed)
Interventional Radiology Procedure Note  Procedure: CT guided left gluteal fluid collection aspiration  Findings: Please refer to procedural dictation for full description. Approximately 3 mL purulent fluid aspirated from gas containing fluid collection in left peri-rectal gluteal musculature.  Complications: None immediate  Estimated Blood Loss: < 5 ml  Recommendations: Follow up cultures.   Marliss Coots, MD

## 2023-09-20 NOTE — Telephone Encounter (Signed)
Pt was notified of the providers recommendation.  Pt was scheduled for 10/11/2023 at 11:10 AM with Dr. Doy Hutching. Pt made aware. Pt verbalized understanding with all questions answered.

## 2023-09-20 NOTE — Progress Notes (Signed)
   IR note  CCS requesting aspiration of pelvic abscess  Pt is aware of procedure benefits and risks--  Consent signed and in chart

## 2023-09-20 NOTE — Progress Notes (Signed)
Progress Note     Subjective: Continues to have significant pain in his bottom and ongoing drainage. No BM for greater than one week. Ongoing LE weakness today as well   Objective: Vital signs in last 24 hours: Temp:  [98.4 F (36.9 C)-100.8 F (38.2 C)] 100.2 F (37.9 C) (01/28 0445) Pulse Rate:  [72-94] 81 (01/28 0445) Resp:  [14-18] 18 (01/28 0445) BP: (100-122)/(60-71) 103/60 (01/28 0445) SpO2:  [97 %-100 %] 97 % (01/28 0445) Last BM Date : 09/14/23  Intake/Output from previous day: 01/27 0701 - 01/28 0700 In: 17.1 [IV Piggyback:17.1] Out: -  Intake/Output this shift: No intake/output data recorded.  PE: General: pleasant, WD, male who is laying in bed in NAD HEENT: head is normocephalic, atraumatic.  Sclera are noninjected.  Pupils equal and round. EOMs intact.  Ears and nose without any masses or lesions.  Mouth is pink and moist Lungs: Respiratory effort nonlabored MSK: all 4 extremities are symmetrical with no cyanosis, clubbing, or edema. GU: seton in place with purulent drainage Skin: warm and dry Psych: A&Ox3 with an appropriate affect.   Miriam RN present for exam   Lab Results:  Recent Labs    09/18/23 1712 09/20/23 0620  WBC 14.3* 15.1*  HGB 8.8* 8.0*  HCT 26.0* 24.1*  PLT 574* 536*   BMET Recent Labs    09/18/23 1712 09/20/23 0620  NA 138 135  K 4.2 4.8  CL 109 104  CO2 19* 22  GLUCOSE 106* 99  BUN 18 15  CREATININE 1.52* 1.67*  CALCIUM 8.7* 8.4*   PT/INR No results for input(s): "LABPROT", "INR" in the last 72 hours. CMP     Component Value Date/Time   NA 135 09/20/2023 0620   NA 140 01/10/2023 0920   K 4.8 09/20/2023 0620   CL 104 09/20/2023 0620   CO2 22 09/20/2023 0620   GLUCOSE 99 09/20/2023 0620   BUN 15 09/20/2023 0620   BUN 16 01/10/2023 0920   CREATININE 1.67 (H) 09/20/2023 0620   CALCIUM 8.4 (L) 09/20/2023 0620   PROT 8.4 (H) 04/03/2023 1950   PROT 6.9 01/10/2023 0920   ALBUMIN 3.8 04/03/2023 1950   ALBUMIN  3.9 (L) 01/10/2023 0920   AST 21 04/03/2023 1950   ALT 27 04/03/2023 1950   ALKPHOS 123 04/03/2023 1950   BILITOT 0.6 04/03/2023 1950   BILITOT 0.6 01/10/2023 0920   GFRNONAA 53 (L) 09/20/2023 0620   GFRAA >60 01/08/2020 1830   Lipase     Component Value Date/Time   LIPASE 35 06/04/2020 1148       Studies/Results: CT PELVIS W CONTRAST Result Date: 09/19/2023 CLINICAL DATA:  R/O Pelvic abscess assess pre sacral collection for possible IR drainage EXAM: CT PELVIS WITH CONTRAST TECHNIQUE: Multidetector CT imaging of the pelvis was performed using the standard protocol following the bolus administration of intravenous contrast. RADIATION DOSE REDUCTION: This exam was performed according to the departmental dose-optimization program which includes automated exposure control, adjustment of the mA and/or kV according to patient size and/or use of iterative reconstruction technique. CONTRAST:  75mL OMNIPAQUE IOHEXOL 350 MG/ML SOLN COMPARISON:  MR pelvis, 09/19/2023.  CT AP, 08/22/2023. FINDINGS: Urinary Tract:  Minimal urinary bladder distention. Bowel:  Imaged portions of small bowel and colon are nondilated. Vascular/Lymphatic: No pathologically enlarged lymph nodes. No significant vascular abnormality seen. Reproductive:  Prostate is within normal limits. Other: Stable positioning of drain seton at the anorectal verge. 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 See key image. Small intramuscular rim-enhancing air-and fluid collections within the medial and deep margins of the gluteus maximus, largest measuring up to 1.5 cm RIGHT and 3.0 cm LEFT. Additional rim-enhancing air-fluid containing collection along the sacrum, measuring up to 2.0 x 4.0 x 6.0 cm (AP by transaxial by CC). No discrete underlying focal demineralization, erosion or periosteal change at the sacrum or coccyx. Musculoskeletal: Small  fat-containing RIGHT inguinal hernia versus cord lipoma. No acute osseous abnormality IMPRESSION: 1. LEFT-sided persistent anorectal fistula with small intramuscular abscesses along the medial and deep margins of the gluteus maximus, as above. 2. Additional small abscess coursing longitudinally along the sacrum. No overt CT evidence of sacrococcygeal osteomyelitis. Electronically Signed   By: Roanna Banning M.D.   On: 09/19/2023 16:29   MR PELVIS W WO CONTRAST Result Date: 09/19/2023 CLINICAL DATA:  Low back pain. History of perianal abscess/fistula. Clinical concern for osteomyelitis. EXAM: MRI PELVIS WITHOUT AND WITH CONTRAST TECHNIQUE: Multiplanar multisequence MR imaging of the pelvis was performed both before and after administration of intravenous contrast. CONTRAST:  9mL GADAVIST GADOBUTROL 1 MMOL/ML IV SOLN COMPARISON:  MRI pelvis 05/16/2021, CT pelvis 08/22/2023 and lumbar MRI 09/19/2023. FINDINGS: Bones/Joint/Cartilage There are chronic perirectal and presacral inflammatory changes which have progressed from previous MRI. Recent CT demonstrated a perianal fistula with seton tube in place. A presacral fluid collection with a small amount of internal air and heterogeneous surrounding enhancement appears enlarged from the most recent CT, measuring up to 7.3 x 4.0 x 1.5 cm. Appearance is suspicious for a presacral abscess. There is increased marrow T2 hyperintensity and enhancement within the adjacent mid to distal sacrum and coccyx suspicious for early osteomyelitis. No gross cortical destruction identified. The sacroiliac joints appear intact. No significant hip joint effusion or abnormal synovial enhancement. The remainder of the bony pelvis appears intact. Ligaments No significant ligamentous abnormalities are identified. Muscles and Tendons Presacral inflammatory changes extend into the adjacent piriformis and gluteus maximus muscles bilaterally. There are associated small intramuscular fluid collections  with peripheral enhancement. The common hamstring and iliopsoas tendons appear unremarkable. Soft tissue As above, enlarging complex presacral fluid collection with peripheral enhancement suspicious for an abscess. There is asymmetric extension of peripherally enhancing fluid into the posterior aspect of the left greater sciatic notch, measuring 2.0 x 1.3 x 1.3 cm. Grossly stable underlying rectal wall thickening. IMPRESSION: 1. Enlarging presacral fluid collection with peripheral enhancement suspicious for an abscess. There is asymmetric extension of peripherally enhancing fluid into the posterior aspect of the left greater sciatic notch. 2. Increased marrow T2 hyperintensity and enhancement within the adjacent mid to distal sacrum and coccyx suspicious for early osteomyelitis. 3. Presacral inflammatory changes extend into the adjacent piriformis and gluteus maximus muscles bilaterally with associated small intramuscular fluid collections. 4. No evidence of sacroiliitis or hip joint septic arthritis. Electronically Signed   By: Carey Bullocks M.D.   On: 09/19/2023 08:57   MR Lumbar Spine W Wo Contrast Result Date: 09/19/2023 CLINICAL DATA:  38 year old male with back pain radiating down the left leg. Dizziness, shortness of breath. History of perianal abscess. EXAM: MRI LUMBAR SPINE WITHOUT AND WITH CONTRAST TECHNIQUE: Multiplanar and multiecho pulse sequences of the lumbar spine were obtained without and with intravenous contrast. CONTRAST:  9mL GADAVIST GADOBUTROL 1 MMOL/ML IV SOLN COMPARISON:  Thoracic MRI today reported separately. CT Abdomen and Pelvis 08/22/2023. Pelvis MRI today reported separately. FINDINGS: Segmentation:  Normal, concordant with the thoracic numbering  today. Alignment: Stable to mildly improved lumbar lordosis compared to the CT last month. Vertebrae: Generalized nonspecific decreased bone marrow signal in the visible spine and pelvis. Stable lumbar vertebral height. No marrow edema  identified in the lumbar spine. However, there is patchy abnormal marrow edema and enhancement identified in the lower sacrum beginning at the S3 central vertebral level (series 8, image 8 and series 12, image 8). Upper sacral segments, upper sacral ala and SI joints appear spared. Conus medullaris and cauda equina: Conus better demonstrated on the thoracic MRI today reported separately. Minimally visible on these images. Cauda equina nerve roots appear to remain normal. No lumbar abnormal intrathecal enhancement or dural thickening. Sacral epidural lipomatosis. Paraspinal and other soft tissues: Negative lumbar paraspinal soft tissues. Visible abdominal viscera stable from the CT Abdomen and Pelvis last month including heterogeneous appearance of the kidneys. Partially visible abnormal presacral enhancement and loculated appearing fluid or edema (series 12, image 8 and series 8, image 8). This extends up to the ventral lower S2 sacral level. Disc levels: No age advanced lumbar spine degeneration or spinal stenosis is identified. IMPRESSION: 1. Evidence of lower Sacral Osteomyelitis with presacral Abscess, Phlegmon: Partially visible abnormal sacrum beginning at the S3 body with marrow edema and enhancement. And abnormal presacral soft tissue fluid, edema, and enhancement. See further details on Pelvis MRI today reported separately. 2. No acute or inflammatory process identified in the lumbar spine. No lumbar spinal stenosis. Electronically Signed   By: Odessa Fleming M.D.   On: 09/19/2023 06:45   MR THORACIC SPINE W WO CONTRAST Result Date: 09/19/2023 CLINICAL DATA:  38 year old male with back pain radiating down the left leg. Dizziness, shortness of breath. History of perianal abscess. EXAM: MRI THORACIC WITHOUT AND WITH CONTRAST TECHNIQUE: Multiplanar and multiecho pulse sequences of the thoracic spine were obtained without and with intravenous contrast. CONTRAST:  9mL GADAVIST GADOBUTROL 1 MMOL/ML IV SOLN  COMPARISON:  CTA chest 04/03/2023. FINDINGS: Limited cervical spine imaging:  Minimally included. Thoracic spine segmentation:  Normal on the comparison CTA. Alignment: Stable thoracic kyphosis compared to last year. No significant scoliosis or spondylolisthesis. Vertebrae: Generalized decreased T1 marrow signal, nonspecific. Bone mineralization within normal limits by CT last year. Stable, generally maintained vertebral body height. No marrow edema or evidence of acute osseous abnormality. No abnormal enhancement identified. Cord: No thoracic spinal cord signal abnormality. Maintained cord volume. Conus medullaris appears normal at T11-T12. No abnormal intradural enhancement. No dural thickening. Paraspinal and other soft tissues: Negative. Negative visualized posterior paraspinal soft tissues. Disc levels: Negative aside from T3-T4: Moderate to severe facet and/or ligament flavum hypertrophy on the right (series 20, image 14). Mild spinal stenosis. Moderate right T3 neural foraminal stenosis. T4-T5: Moderate similar right ligament flavum hypertrophy. Borderline to mild stenosis. T5-T6: Mild left side ligament flavum hypertrophy (series 20, image 22). No stenosis. T9-T10: Mild to moderate bilateral ligament flavum hypertrophy greater on the left (series 20, image 39). Mild spinal stenosis. Moderate to severe left and mild to moderate right T9 foraminal stenosis. T10-T11: Subtle posterior disc bulging. Mild to moderate facet and/or ligament flavum hypertrophy. Mild spinal stenosis (series 20, image 45). No significant spinal cord mass effect. Mild to moderate bilateral T10 foraminal stenosis. T11-T12: Moderate facet hypertrophy greater on the left. No spinal stenosis but moderate to severe left T11 foraminal stenosis. T12-L1: Mild to moderate left ligament flavum hypertrophy. Moderate to severe left T12 foraminal stenosis. IMPRESSION: 1. No acute or inflammatory abnormality in the Thoracic Spine. 2. Multilevel  thoracic spine degeneration chiefly in the form of posterior element hypertrophy. Isolated mild thoracic spinal stenosis at T10-T11 when combined with disc bulging there. No thoracic spinal cord mass effect or signal abnormality. Moderate to severe associated neural foraminal stenosis at the right T3, right T9, left T11 and T12 nerve levels. Electronically Signed   By: Odessa Fleming M.D.   On: 09/19/2023 06:37    Anti-infectives: Anti-infectives (From admission, onward)    Start     Dose/Rate Route Frequency Ordered Stop   09/19/23 1130  piperacillin-tazobactam (ZOSYN) IVPB 3.375 g  Status:  Discontinued        3.375 g 12.5 mL/hr over 240 Minutes Intravenous Every 8 hours 09/19/23 1103 09/19/23 1331   09/19/23 0745  ceFEPIme (MAXIPIME) 2 g in sodium chloride 0.9 % 100 mL IVPB  Status:  Discontinued        2 g 200 mL/hr over 30 Minutes Intravenous  Once 09/19/23 0743 09/19/23 0920   09/19/23 0745  vancomycin (VANCOCIN) IVPB 1000 mg/200 mL premix  Status:  Discontinued       Placed in "And" Linked Group   1,000 mg 200 mL/hr over 60 Minutes Intravenous  Once 09/19/23 0743 09/19/23 0920   09/19/23 0745  vancomycin (VANCOCIN) IVPB 1000 mg/200 mL premix       Placed in "And" Linked Group   1,000 mg 200 mL/hr over 60 Minutes Intravenous  Once 09/19/23 0743 09/19/23 1610        Assessment/Plan  Crohn's colitis with anorectal fistula and seton in place Pre-sacral abscess and osteomyelitis  - WBC 15.1 and febrile to 100.76f - ID following and recc holding off on abx until drainage - seton in place with purulent drainage around - no external cellulitis  - MR with above - given location surgical drainage is not currently recommended - IR consulted and IR aspiration today - patient has been followed by Dr stark GI and been on stelara in the past. Dr. Michaell Cowing has reached out to Dr. Myrtie Neither for consult   FEN: NPO, IVF per primary team  VTE: SQH ID: no ongoing abx currently   - per admitting team -   Tobacco abuse  HTN  ?Neuromuscular disorder CKD stage III Dilated cardiomyopathy  GERD  I reviewed Consultant IR, ID notes, last 24 h vitals and pain scores, last 48 h intake and output, last 24 h labs and trends, and last 24 h imaging results.    LOS: 1 day   Eric Form, [facility] 09/20/2023, 8:04 AM Please see Amion for pager number during day hours 7:00am-4:30pm
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
## 2023-09-20 NOTE — Progress Notes (Signed)
HD#1 Subjective:  Overnight Events: No acute event overnight.  Patient was examined at bedside.  He is doing well, has not had a bowel movement for about a week. He is n.p.o. for drainage of the sacral abscess by IR.   Objective:  Vital signs in last 24 hours: Vitals:   09/19/23 2054 09/20/23 0110 09/20/23 0445 09/20/23 0903  BP: 122/67 103/71 103/60 107/61  Pulse: 91 92 81 83  Resp: 18 18 18 17   Temp: 99.5 F (37.5 C) (!) 100.8 F (38.2 C) 100.2 F (37.9 C) 98.3 F (36.8 C)  TempSrc: Oral Oral Oral Oral  SpO2: 100% 97% 97% 96%  Weight:      Height:       Supplemental O2: Room Air SpO2: 96 %   Physical Exam:   General: NAD. CV: RRR. No m/r/g. No LE edema Pulmonary: Lungs CTAB. Normal effort. No wheezing or rales. Abdominal: Soft, nontender, nondistended. Normal bowel sounds. GU: Active drainage from his fistula with minimal surrounding cellulitis.  Filed Weights   09/18/23 1658  Weight: 93 kg     Intake/Output Summary (Last 24 hours) at 09/20/2023 1339 Last data filed at 09/20/2023 0902 Gross per 24 hour  Intake 0 ml  Output --  Net 0 ml   Net IO Since Admission: 17.08 mL [09/20/23 1339]  No results for input(s): "GLUCAP" in the last 72 hours.   Pertinent Labs:    Latest Ref Rng & Units 09/20/2023    6:20 AM 09/18/2023    5:12 PM 08/22/2023   10:53 AM  CBC  WBC 4.0 - 10.5 K/uL 15.1  14.3    Hemoglobin 13.0 - 17.0 g/dL 8.0  8.8  29.5   Hematocrit 39.0 - 52.0 % 24.1  26.0  32.0   Platelets 150 - 400 K/uL 536  574         Latest Ref Rng & Units 09/20/2023    6:20 AM 09/18/2023    5:12 PM 08/22/2023   10:53 AM  CMP  Glucose 70 - 99 mg/dL 99  284  95   BUN 6 - 20 mg/dL 15  18  23    Creatinine 0.61 - 1.24 mg/dL 1.32  4.40  1.02   Sodium 135 - 145 mmol/L 135  138  140   Potassium 3.5 - 5.1 mmol/L 4.8  4.2  4.3   Chloride 98 - 111 mmol/L 104  109  111   CO2 22 - 32 mmol/L 22  19    Calcium 8.9 - 10.3 mg/dL 8.4  8.7      Imaging: CT PELVIS W  CONTRAST Result Date: 09/19/2023 CLINICAL DATA:  R/O Pelvic abscess assess pre sacral collection for possible IR drainage EXAM: CT PELVIS WITH CONTRAST TECHNIQUE: Multidetector CT imaging of the pelvis was performed using the standard protocol following the bolus administration of intravenous contrast. RADIATION DOSE REDUCTION: This exam was performed according to the departmental dose-optimization program which includes automated exposure control, adjustment of the mA and/or kV according to patient size and/or use of iterative reconstruction technique. CONTRAST:  75mL OMNIPAQUE IOHEXOL 350 MG/ML SOLN COMPARISON:  MR pelvis, 09/19/2023.  CT AP, 08/22/2023. FINDINGS: Urinary Tract:  Minimal urinary bladder distention. Bowel:  Imaged portions of small bowel and colon are nondilated. Vascular/Lymphatic: No pathologically enlarged lymph nodes. No significant vascular abnormality seen. Reproductive:  Prostate is within normal limits. Other: Stable positioning of drain seton at the anorectal verge. Increased peri-rectal stranding with air and fluid tracking along the  LEFT rectal wall, consistent with a persistent fistula-in-ano. See key image. Small intramuscular rim-enhancing air-and fluid collections within the medial and deep margins of the gluteus maximus, largest measuring up to 1.5 cm RIGHT and 3.0 cm LEFT. Additional rim-enhancing air-fluid containing collection along the sacrum, measuring up to 2.0 x 4.0 x 6.0 cm (AP by transaxial by CC). No discrete underlying focal demineralization, erosion or periosteal change at the sacrum or coccyx. Musculoskeletal: Small fat-containing RIGHT inguinal hernia versus cord lipoma. No acute osseous abnormality IMPRESSION: 1. LEFT-sided persistent anorectal fistula with small intramuscular abscesses along the medial and deep margins of the gluteus maximus, as above. 2. Additional small abscess coursing longitudinally along the sacrum. No overt CT evidence of sacrococcygeal  osteomyelitis. Electronically Signed   By: Roanna Banning M.D.   On: 09/19/2023 16:29    Assessment/Plan:   Principal Problem:   Osteomyelitis of sacrum (HCC) Active Problems:   Crohn's proctitis, with fistula (HCC)   Anorectal fistula   CKD (chronic kidney disease) stage 3, GFR 30-59 ml/min (HCC)   Abscess, gluteal   Acute on chronic anemia   Patient Summary: Shawn [name] is a 38 y.o. M with PMH Crohn's colitis with anorectal fistula, prior coccyx osteomyelitis, macrocytic anemia, CKD stage 3a, who presented with 1 month of low back pain admitted for sacral abscess and sacral osteomyelitis.  # Osteomyelitis of sacrum # Coccyx osteomyelitis # Crohn's disease with anorectal fistula. # Chron's proctitis with  He is doing well this morning, endorses 7/10 perianal pain.  CT and MRI showed a deep gluteal abscess and concern for  sacral osteomyelitis. ESR and CRP downtrending.  He is n.p.o. today, will undergo drainage of the abscess by IR.  After successful drainage, will continue with broad antibiotic coverage with IV vancomycin, ceftriaxone and metronidazole. Patient will need to re-establish  with GI outpatient to restart biologics for Crohn's disease. GI will not see pt while he is inpatient.   - Infectious disease following, appreciate recs. - Cw vancomycin/ceftriaxone/metronidazole. - Follow-up with Cx from the abscess  - Scheduled Tylenol, and Oxy 5 mg as needed  # Constipation Etiology unclear, no BM for 1 week.  Will order MiraLAX, and if that is not sufficient would add senna.  - MiraLAX   # CKD 3A   SCr 1.62, baseline creatinine between 1.5-1.7.  - BMP - Avoid nephrotoxic drugs  # Acute on chronic anemia. # History of vit B12 deficiency. Hb  8.8---> 8.0, MCV 85.4.  No signs of acutely bleeding. Will check folate and Vit b12, since he has a prior hx of macrocytic anemia.   -Transfuse if hemoglobin less than 7 - follow up on iron labs. -Cw vitamin B12  supplementation.  Diet: NPO. IVF:  None VTE: heparin injection 5,000 Units Start: 09/20/23 2200 Code: Full PT/OT: Pending ID:  Anti-infectives (From admission, onward)    Start     Dose/Rate Route Frequency Ordered Stop   09/19/23 1130  piperacillin-tazobactam (ZOSYN) IVPB 3.375 g  Status:  Discontinued        3.375 g 12.5 mL/hr over 240 Minutes Intravenous Every 8 hours 09/19/23 1103 09/19/23 1331   09/19/23 0745  ceFEPIme (MAXIPIME) 2 g in sodium chloride 0.9 % 100 mL IVPB  Status:  Discontinued        2 g 200 mL/hr over 30 Minutes Intravenous  Once 09/19/23 0743 09/19/23 0920   09/19/23 0745  vancomycin (VANCOCIN) IVPB 1000 mg/200 mL premix  Status:  Discontinued  Placed in "And" Linked Group   1,000 mg 200 mL/hr over 60 Minutes Intravenous  Once 09/19/23 0743 09/19/23 0920   09/19/23 0745  vancomycin (VANCOCIN) IVPB 1000 mg/200 mL premix       Placed in "And" Linked Group   1,000 mg 200 mL/hr over 60 Minutes Intravenous  Once 09/19/23 0743 09/19/23 0960        Anticipated discharge to pending medical stabilization.  Laretta Bolster, MD 09/20/2023, 1:39 PM Pager: [phone] [facility]  Please contact the on call pager after 5 pm and on weekends at [phone].

## 2023-09-20 NOTE — Plan of Care (Signed)
  Problem: Education: Goal: Knowledge of General Education information will improve Description: Including pain rating scale, medication(s)/side effects and non-pharmacologic comfort measures Outcome: Progressing   Problem: Clinical Measurements: Goal: Ability to maintain clinical measurements within normal limits will improve Outcome: Progressing   Problem: Activity: Goal: Risk for activity intolerance will decrease Outcome: Progressing   Problem: Pain Managment: Goal: General experience of comfort will improve and/or be controlled Outcome: Progressing   Problem: Safety: Goal: Ability to remain free from injury will improve Outcome: Progressing   Problem: Skin Integrity: Goal: Risk for impaired skin integrity will decrease Outcome: Progressing

## 2023-09-20 NOTE — Plan of Care (Signed)

## 2023-09-20 NOTE — H&P (Signed)
Referring Physician(s): Dr Lamont Snowball  Supervising Physician: Marliss Coots  Patient Status:  [facility] - In-pt  Chief Complaint:  Pelvic abscesses Chrons disease  Subjective:  FULL Code per chart  Chrons disease: colitis and anorectal fistula Pre sacral abscess Pain and leukocytosis  CT yesterday:  IMPRESSION: 1. LEFT-sided persistent anorectal fistula with small intramuscular abscesses along the medial and deep margins of the gluteus maximus, as above. 2. Additional small abscess coursing longitudinally along the sacrum. No overt CT evidence of sacrococcygeal osteomyelitis.  CCS asking for drain placement Reviewed imaging with Dr Elby Showers--- too small for drain  Offered aspiration if CCS felt needed Aspiration planned now in IR     Allergies: Shrimp [shellfish allergy] and Nsaids  Medications: Prior to Admission medications   Medication Sig Start Date End Date Taking? Authorizing Provider  acetaminophen (TYLENOL) 500 MG tablet Take 2 tablets (1,000 mg total) by mouth every 8 (eight) hours as needed for moderate pain. 02/18/23  Yes Glyn Ade, MD  vitamin B-12 1000 MCG tablet Take 1 tablet (1,000 mcg total) by mouth daily. 07/10/21  Yes Karie Soda, MD  oxyCODONE-acetaminophen (PERCOCET/ROXICET) 5-325 MG tablet Take 1-2 tablets by mouth every 6 (six) hours as needed for severe pain (pain score 7-10). Patient not taking: Reported on 09/19/2023 08/22/23   Rolan Bucco, MD  ustekinumab (STELARA) 90 MG/ML SOSY injection Inject 1 mL (90 mg total) into the skin every 8 (eight) weeks. no further refills unless appt is kept Patient not taking: Reported on 09/19/2023 01/19/23   Meryl Dare, MD     Vital Signs: BP 107/61 (BP Location: Right Arm)   Pulse 83   Temp 98.3 F (36.8 C) (Oral)   Resp 17   Ht 6\' 1"  (1.854 m)   Wt 205 lb (93 kg)   SpO2 96%   BMI 27.05 kg/m   Physical Exam Vitals reviewed.  HENT:     Mouth/Throat:     Mouth: Mucous membranes are  moist.  Cardiovascular:     Rate and Rhythm: Normal rate and regular rhythm.     Heart sounds: No murmur heard. Pulmonary:     Effort: Pulmonary effort is normal.     Breath sounds: Normal breath sounds. No wheezing.  Abdominal:     Palpations: Abdomen is soft.  Musculoskeletal:        General: Normal range of motion.  Skin:    General: Skin is warm.  Neurological:     Mental Status: He is alert and oriented to person, place, and time.  Psychiatric:        Behavior: Behavior normal.     Imaging: CT PELVIS W CONTRAST Result Date: 09/19/2023 CLINICAL DATA:  R/O Pelvic abscess assess pre sacral collection for possible IR drainage EXAM: CT PELVIS WITH CONTRAST TECHNIQUE: Multidetector CT imaging of the pelvis was performed using the standard protocol following the bolus administration of intravenous contrast. RADIATION DOSE REDUCTION: This exam was performed according to the departmental dose-optimization program which includes automated exposure control, adjustment of the mA and/or kV according to patient size and/or use of iterative reconstruction technique. CONTRAST:  75mL OMNIPAQUE IOHEXOL 350 MG/ML SOLN COMPARISON:  MR pelvis, 09/19/2023.  CT AP, 08/22/2023. FINDINGS: Urinary Tract:  Minimal urinary bladder distention. Bowel:  Imaged portions of small bowel and colon are nondilated. Vascular/Lymphatic: No pathologically enlarged lymph nodes. No significant vascular abnormality seen. Reproductive:  Prostate is within normal limits. Other: Stable positioning of drain seton at the anorectal verge. Increased peri-rectal  stranding with air and fluid tracking along the LEFT rectal wall, consistent with a persistent fistula-in-ano. See key image. Small intramuscular rim-enhancing air-and fluid collections within the medial and deep margins of the gluteus maximus, largest measuring up to 1.5 cm RIGHT and 3.0 cm LEFT. Additional rim-enhancing air-fluid containing collection along the sacrum, measuring  up to 2.0 x 4.0 x 6.0 cm (AP by transaxial by CC). No discrete underlying focal demineralization, erosion or periosteal change at the sacrum or coccyx. Musculoskeletal: Small fat-containing RIGHT inguinal hernia versus cord lipoma. No acute osseous abnormality IMPRESSION: 1. LEFT-sided persistent anorectal fistula with small intramuscular abscesses along the medial and deep margins of the gluteus maximus, as above. 2. Additional small abscess coursing longitudinally along the sacrum. No overt CT evidence of sacrococcygeal osteomyelitis. Electronically Signed   By: Roanna Banning M.D.   On: 09/19/2023 16:29   MR PELVIS W WO CONTRAST Result Date: 09/19/2023 CLINICAL DATA:  Low back pain. History of perianal abscess/fistula. Clinical concern for osteomyelitis. EXAM: MRI PELVIS WITHOUT AND WITH CONTRAST TECHNIQUE: Multiplanar multisequence MR imaging of the pelvis was performed both before and after administration of intravenous contrast. CONTRAST:  9mL GADAVIST GADOBUTROL 1 MMOL/ML IV SOLN COMPARISON:  MRI pelvis 05/16/2021, CT pelvis 08/22/2023 and lumbar MRI 09/19/2023. FINDINGS: Bones/Joint/Cartilage There are chronic perirectal and presacral inflammatory changes which have progressed from previous MRI. Recent CT demonstrated a perianal fistula with seton tube in place. A presacral fluid collection with a small amount of internal air and heterogeneous surrounding enhancement appears enlarged from the most recent CT, measuring up to 7.3 x 4.0 x 1.5 cm. Appearance is suspicious for a presacral abscess. There is increased marrow T2 hyperintensity and enhancement within the adjacent mid to distal sacrum and coccyx suspicious for early osteomyelitis. No gross cortical destruction identified. The sacroiliac joints appear intact. No significant hip joint effusion or abnormal synovial enhancement. The remainder of the bony pelvis appears intact. Ligaments No significant ligamentous abnormalities are identified. Muscles and  Tendons Presacral inflammatory changes extend into the adjacent piriformis and gluteus maximus muscles bilaterally. There are associated small intramuscular fluid collections with peripheral enhancement. The common hamstring and iliopsoas tendons appear unremarkable. Soft tissue As above, enlarging complex presacral fluid collection with peripheral enhancement suspicious for an abscess. There is asymmetric extension of peripherally enhancing fluid into the posterior aspect of the left greater sciatic notch, measuring 2.0 x 1.3 x 1.3 cm. Grossly stable underlying rectal wall thickening. IMPRESSION: 1. Enlarging presacral fluid collection with peripheral enhancement suspicious for an abscess. There is asymmetric extension of peripherally enhancing fluid into the posterior aspect of the left greater sciatic notch. 2. Increased marrow T2 hyperintensity and enhancement within the adjacent mid to distal sacrum and coccyx suspicious for early osteomyelitis. 3. Presacral inflammatory changes extend into the adjacent piriformis and gluteus maximus muscles bilaterally with associated small intramuscular fluid collections. 4. No evidence of sacroiliitis or hip joint septic arthritis. Electronically Signed   By: Carey Bullocks M.D.   On: 09/19/2023 08:57   MR Lumbar Spine W Wo Contrast Result Date: 09/19/2023 CLINICAL DATA:  38 year old male with back pain radiating down the left leg. Dizziness, shortness of breath. History of perianal abscess. EXAM: MRI LUMBAR SPINE WITHOUT AND WITH CONTRAST TECHNIQUE: Multiplanar and multiecho pulse sequences of the lumbar spine were obtained without and with intravenous contrast. CONTRAST:  9mL GADAVIST GADOBUTROL 1 MMOL/ML IV SOLN COMPARISON:  Thoracic MRI today reported separately. CT Abdomen and Pelvis 08/22/2023. Pelvis MRI today reported separately. FINDINGS: Segmentation:  Normal, concordant with the thoracic numbering today. Alignment: Stable to mildly improved lumbar lordosis  compared to the CT last month. Vertebrae: Generalized nonspecific decreased bone marrow signal in the visible spine and pelvis. Stable lumbar vertebral height. No marrow edema identified in the lumbar spine. However, there is patchy abnormal marrow edema and enhancement identified in the lower sacrum beginning at the S3 central vertebral level (series 8, image 8 and series 12, image 8). Upper sacral segments, upper sacral ala and SI joints appear spared. Conus medullaris and cauda equina: Conus better demonstrated on the thoracic MRI today reported separately. Minimally visible on these images. Cauda equina nerve roots appear to remain normal. No lumbar abnormal intrathecal enhancement or dural thickening. Sacral epidural lipomatosis. Paraspinal and other soft tissues: Negative lumbar paraspinal soft tissues. Visible abdominal viscera stable from the CT Abdomen and Pelvis last month including heterogeneous appearance of the kidneys. Partially visible abnormal presacral enhancement and loculated appearing fluid or edema (series 12, image 8 and series 8, image 8). This extends up to the ventral lower S2 sacral level. Disc levels: No age advanced lumbar spine degeneration or spinal stenosis is identified. IMPRESSION: 1. Evidence of lower Sacral Osteomyelitis with presacral Abscess, Phlegmon: Partially visible abnormal sacrum beginning at the S3 body with marrow edema and enhancement. And abnormal presacral soft tissue fluid, edema, and enhancement. See further details on Pelvis MRI today reported separately. 2. No acute or inflammatory process identified in the lumbar spine. No lumbar spinal stenosis. Electronically Signed   By: Odessa Fleming M.D.   On: 09/19/2023 06:45   MR THORACIC SPINE W WO CONTRAST Result Date: 09/19/2023 CLINICAL DATA:  38 year old male with back pain radiating down the left leg. Dizziness, shortness of breath. History of perianal abscess. EXAM: MRI THORACIC WITHOUT AND WITH CONTRAST TECHNIQUE:  Multiplanar and multiecho pulse sequences of the thoracic spine were obtained without and with intravenous contrast. CONTRAST:  9mL GADAVIST GADOBUTROL 1 MMOL/ML IV SOLN COMPARISON:  CTA chest 04/03/2023. FINDINGS: Limited cervical spine imaging:  Minimally included. Thoracic spine segmentation:  Normal on the comparison CTA. Alignment: Stable thoracic kyphosis compared to last year. No significant scoliosis or spondylolisthesis. Vertebrae: Generalized decreased T1 marrow signal, nonspecific. Bone mineralization within normal limits by CT last year. Stable, generally maintained vertebral body height. No marrow edema or evidence of acute osseous abnormality. No abnormal enhancement identified. Cord: No thoracic spinal cord signal abnormality. Maintained cord volume. Conus medullaris appears normal at T11-T12. No abnormal intradural enhancement. No dural thickening. Paraspinal and other soft tissues: Negative. Negative visualized posterior paraspinal soft tissues. Disc levels: Negative aside from T3-T4: Moderate to severe facet and/or ligament flavum hypertrophy on the right (series 20, image 14). Mild spinal stenosis. Moderate right T3 neural foraminal stenosis. T4-T5: Moderate similar right ligament flavum hypertrophy. Borderline to mild stenosis. T5-T6: Mild left side ligament flavum hypertrophy (series 20, image 22). No stenosis. T9-T10: Mild to moderate bilateral ligament flavum hypertrophy greater on the left (series 20, image 39). Mild spinal stenosis. Moderate to severe left and mild to moderate right T9 foraminal stenosis. T10-T11: Subtle posterior disc bulging. Mild to moderate facet and/or ligament flavum hypertrophy. Mild spinal stenosis (series 20, image 45). No significant spinal cord mass effect. Mild to moderate bilateral T10 foraminal stenosis. T11-T12: Moderate facet hypertrophy greater on the left. No spinal stenosis but moderate to severe left T11 foraminal stenosis. T12-L1: Mild to moderate left  ligament flavum hypertrophy. Moderate to severe left T12 foraminal stenosis. IMPRESSION: 1. No acute or inflammatory abnormality  in the Thoracic Spine. 2. Multilevel thoracic spine degeneration chiefly in the form of posterior element hypertrophy. Isolated mild thoracic spinal stenosis at T10-T11 when combined with disc bulging there. No thoracic spinal cord mass effect or signal abnormality. Moderate to severe associated neural foraminal stenosis at the right T3, right T9, left T11 and T12 nerve levels. Electronically Signed   By: Odessa Fleming M.D.   On: 09/19/2023 06:37    Labs:  CBC: Recent Labs    04/03/23 1950 08/22/23 1030 08/22/23 1053 09/18/23 1712 09/20/23 0620  WBC 12.7* 13.3*  --  14.3* 15.1*  HGB 11.8* 11.0* 10.9* 8.8* 8.0*  HCT 35.8* 33.4* 32.0* 26.0* 24.1*  PLT 327 505*  --  574* 536*    COAGS: No results for input(s): "INR", "APTT" in the last 8760 hours.  BMP: Recent Labs    04/03/23 1950 08/22/23 1030 08/22/23 1053 09/18/23 1712 09/20/23 0620  NA 135 135 140 138 135  K 4.0 4.1 4.3 4.2 4.8  CL 106 110 111 109 104  CO2 20* 18*  --  19* 22  GLUCOSE 98 99 95 106* 99  BUN 23* 21* 23* 18 15  CALCIUM 8.6* 8.8*  --  8.7* 8.4*  CREATININE 1.58* 1.56* 1.60* 1.52* 1.67*  GFRNONAA 57* 58*  --  59* 53*    LIVER FUNCTION TESTS: Recent Labs    01/10/23 0920 04/03/23 1950  BILITOT 0.6 0.6  AST 20 21  ALT 29 27  ALKPHOS 141* 123  PROT 6.9 8.4*  ALBUMIN 3.9* 3.8    Assessment and Plan:  Scheduled for pelvic abscess aspiration in IR Risks and benefits of pelvic abscess aspiration was discussed with the patient and/or patient's family including, but not limited to bleeding, infection, damage to adjacent structures or low yield requiring additional tests.  All of the questions were answered and there is agreement to proceed. Consent signed and in chart.  Electronically Signed: Robet Leu, PA-C 09/20/2023, 2:05 PM   I spent a total of 25 Minutes at the the  patient's bedside AND on the patient's hospital floor or unit, greater than 50% of which was counseling/coordinating care for pelvic abscess aspiration

## 2023-09-20 NOTE — Progress Notes (Addendum)
The patient familiar to our group.    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  At some point was diagnosed with Crohn's disease.  Followed by [facility] in the intermittently.  Dr. Russella Dar was the primary gastroenterologist I first met him in 2022.  Had severe complex abscess.  I ended up taking him to the operating room while I was running the inpatient service   Examination anesthesia and drainage of perirectal abscess 04/21/2021 Dr. 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  07/07/2021.   -4 weeks of IV antibiotics for concern of osteomyelitis of coccyx per infectious disease.  Completed 08/05/2021.   -Remicade infusions starting 07/24/2021.   -Removal of 2 of 3 setons 09/15/2021.  (Posterior midline remaining)  -Last seton fell out.  -EUA with replacement of posterior midline seton 01/15/2022   There discussion about switching suppression to Stelara.    I last saw him June 2024.   Had 1 seton remaining.  I had recommended he get follow-up colonoscopy after 6 months of immunosuppression to make sure his Crohn's disease was under control.  He claimed there was a challenge to get his immunosuppressive medicine but he was on it.  I did not have any definite records of that or could prove that through [facility].  I have not seen him since then.  He struggled with a lot of pain.  Colonoscopy August 2024 by Dr. Russella Dar  inflammation and proctitis around the seton at least.  No major Crohn disease proximal to the rectum.  Patient has not seen gastroenterology since then.  Dr. Russella Dar is now retired.  Looking at the scans and MRI of pelvis, he does have a posterior midline seton in place.  On one view it  looks like he may have some presacral fluid.  Maybe a few dots in the pelvic floor as well most likely consistent from prior tracks of his lateral setons.  There is no easy external opening to drain.  I agree with seeing if interventional etiology can drain the suspicious presacral fluid collection to see if he truly has infection or abscess.  I do not think there is a good EUA surgical perirectal/transanal route to get to this.  Dr. Russella Dar has since retired. I think he would be helpful for Gastroenterology to reestablish with this patient since I think he has recurrent Crohn's disease causing recurrent fistula problems.  I worry because the patient has been a challenge to get to follow-up in an outpatient sending to general surgery and Gastroenterology that need to get him involved in while he is an inpatient.  I messaged Dr. Myrtie Neither who is on-call for [facility].  He is concerned about starting biologic with the patient may have potential infection.  I do not know if starting steroids would be a reasonable compromise.  For now, Gastroenterology declines inpatient consultation and wishes to have close outpatient follow-up.  I guess we can follow and if he is not better ask them to reconsider

## 2023-09-20 NOTE — Progress Notes (Signed)
Brief ID Note:   Sampling from abscess achieved via IR - 3 cc purulent fluid noted.   Will start IV daptomycin, Ceftriaxone and Metronidazole and adjust with cultures.   ESR and CRP are quite high favoring more acute OM differential on MRI (not seen on CT scan) - may be radiographic lag.    Rexene Alberts, MSN, NP-C [facility] for Infectious Disease [facility]  [email or web address] Pager: [phone] Office: [phone] [facility] Main Line: [phone] *Secure Chat Communication Welcome

## 2023-09-20 NOTE — Progress Notes (Signed)
   IR Note  [facility]S asking for pre sacral abscess drain placement Imaging reviewed with Dr Elby Showers  Too small for drain placement Could consider aspiration -- if felt needed  [facility]S aware

## 2023-09-21 ENCOUNTER — Encounter (HOSPITAL_COMMUNITY): Payer: Self-pay | Admitting: Internal Medicine

## 2023-09-21 ENCOUNTER — Telehealth: Payer: Self-pay | Admitting: *Deleted

## 2023-09-21 DIAGNOSIS — K604 Rectal fistula, unspecified: Secondary | ICD-10-CM

## 2023-09-21 DIAGNOSIS — E538 Deficiency of other specified B group vitamins: Secondary | ICD-10-CM | POA: Diagnosis not present

## 2023-09-21 DIAGNOSIS — K50113 Crohn's disease of large intestine with fistula: Secondary | ICD-10-CM

## 2023-09-21 DIAGNOSIS — M8618 Other acute osteomyelitis, other site: Secondary | ICD-10-CM | POA: Diagnosis not present

## 2023-09-21 DIAGNOSIS — M4628 Osteomyelitis of vertebra, sacral and sacrococcygeal region: Secondary | ICD-10-CM | POA: Diagnosis not present

## 2023-09-21 DIAGNOSIS — D649 Anemia, unspecified: Secondary | ICD-10-CM

## 2023-09-21 DIAGNOSIS — L02818 Cutaneous abscess of other sites: Secondary | ICD-10-CM | POA: Diagnosis not present

## 2023-09-21 DIAGNOSIS — K6289 Other specified diseases of anus and rectum: Secondary | ICD-10-CM | POA: Diagnosis not present

## 2023-09-21 LAB — BASIC METABOLIC PANEL
Anion gap: 13 (ref 5–15)
BUN: 17 mg/dL (ref 6–20)
CO2: 20 mmol/L — ABNORMAL LOW (ref 22–32)
Calcium: 8.4 mg/dL — ABNORMAL LOW (ref 8.9–10.3)
Chloride: 101 mmol/L (ref 98–111)
Creatinine, Ser: 1.73 mg/dL — ABNORMAL HIGH (ref 0.61–1.24)
GFR, Estimated: 51 mL/min — ABNORMAL LOW (ref >60)
Glucose, Bld: 128 mg/dL — ABNORMAL HIGH (ref 70–99)
Potassium: 4.4 mmol/L (ref 3.5–5.1)
Sodium: 134 mmol/L — ABNORMAL LOW (ref 135–145)

## 2023-09-21 LAB — CBC
HCT: 24.7 % — ABNORMAL LOW (ref 39.0–52.0)
Hemoglobin: 8.2 g/dL — ABNORMAL LOW (ref 13.0–17.0)
MCH: 28.3 pg (ref 26.0–34.0)
MCHC: 33.2 g/dL (ref 30.0–36.0)
MCV: 85.2 fL (ref 80.0–100.0)
Platelets: 527 10*3/uL — ABNORMAL HIGH (ref 150–400)
RBC: 2.9 MIL/uL — ABNORMAL LOW (ref 4.22–5.81)
RDW: 17.2 % — ABNORMAL HIGH (ref 11.5–15.5)
WBC: 10.7 10*3/uL — ABNORMAL HIGH (ref 4.0–10.5)
nRBC: 0 % (ref 0.0–0.2)

## 2023-09-21 LAB — CK: Total CK: 118 U/L (ref 49–397)

## 2023-09-21 LAB — HEPATITIS B SURFACE ANTIGEN: Hepatitis B Surface Ag: NONREACTIVE

## 2023-09-21 MED ORDER — POLYETHYLENE GLYCOL 3350 17 G PO PACK
34.0000 g | PACK | ORAL | Status: AC
Start: 2023-09-21 — End: 2023-09-21
  Administered 2023-09-21 (×3): 34 g via ORAL
  Filled 2023-09-21 (×3): qty 2

## 2023-09-21 MED ORDER — POLYETHYLENE GLYCOL 3350 17 G PO PACK
17.0000 g | PACK | Freq: Two times a day (BID) | ORAL | Status: DC
  Administered 2023-09-21 – 2023-09-23 (×4): 17 g via ORAL
  Filled 2023-09-21 (×8): qty 1

## 2023-09-21 NOTE — Progress Notes (Addendum)
HD#2 Subjective:  Overnight Events: No acute event overnight. He had successful CT-guided left gluteal abscess aspiration yesterday.   Patient examined at bedside. He is not in much pain as yesterday. No BM yet, had MiraLAX this morning. Patient says he spoke with GI yesterday, outpatient follow-up has been scheduled in February to restart  his biologic.  He now understands that he needs to be on a biologic forever to prevent getting the recurrent infections.   Objective:  Vital signs in last 24 hours: Vitals:   09/20/23 1538 09/20/23 2055 09/21/23 0022 09/21/23 0503  BP: 117/67 106/69 119/70 100/63  Pulse: 85 89 85 83  Resp: 17 18 18 18   Temp: 99.2 F (37.3 C) 98.2 F (36.8 C) 98.4 F (36.9 C) 99.9 F (37.7 C)  TempSrc: Oral Oral Oral Oral  SpO2: 99% 100% 98% 97%  Weight:      Height:       Supplemental O2: Room Air SpO2: 97 % O2 Flow Rate (L/min): 2 L/min   Physical Exam:   General: NAD. CV: RRR. No m/r/g. No LE edema Pulmonary: Lungs CTAB. Normal effort. No wheezing or rales. Abdominal: Soft, nontender, nondistended. Normal bowel sounds. GU:  Gluteal cleft without drainage with dressing in place.   Filed Weights   09/18/23 1658  Weight: 93 kg     Intake/Output Summary (Last 24 hours) at 09/21/2023 1829 Last data filed at 09/21/2023 0350 Gross per 24 hour  Intake 390.98 ml  Output --  Net 390.98 ml   Net IO Since Admission: 408.06 mL [09/21/23 0605]  No results for input(s): "GLUCAP" in the last 72 hours.   Pertinent Labs:    Latest Ref Rng & Units 09/20/2023    6:20 AM 09/18/2023    5:12 PM 08/22/2023   10:53 AM  CBC  WBC 4.0 - 10.5 K/uL 15.1  14.3    Hemoglobin 13.0 - 17.0 g/dL 8.0  8.8  93.7   Hematocrit 39.0 - 52.0 % 24.1  26.0  32.0   Platelets 150 - 400 K/uL 536  574         Latest Ref Rng & Units 09/20/2023    6:20 AM 09/18/2023    5:12 PM 08/22/2023   10:53 AM  CMP  Glucose 70 - 99 mg/dL 99  169  95   BUN 6 - 20 mg/dL 15  18  23     Creatinine 0.61 - 1.24 mg/dL 6.78  9.38  1.01   Sodium 135 - 145 mmol/L 135  138  140   Potassium 3.5 - 5.1 mmol/L 4.8  4.2  4.3   Chloride 98 - 111 mmol/L 104  109  111   CO2 22 - 32 mmol/L 22  19    Calcium 8.9 - 10.3 mg/dL 8.4  8.7      Imaging: CT guided needle placement Result Date: 09/20/2023 INDICATION: 38 year old male with history of perianal abscess. EXAM: CT-guided aspiration MEDICATIONS: None. ANESTHESIA/SEDATION: Moderate (conscious) sedation was employed during this procedure. A total of Versed 1 mg and Fentanyl 75 mcg was administered intravenously by the radiology nurse. Total intra-service moderate Sedation Time: 6 minutes. The patient's level of consciousness and vital signs were monitored continuously by radiology nursing throughout the procedure under my direct supervision. COMPLICATIONS: None immediate. PROCEDURE: Informed written consent was obtained from the patient after a thorough discussion of the procedural risks, benefits and alternatives. All questions were addressed. Maximal Sterile Barrier Technique was utilized including caps, mask, sterile gowns,  sterile gloves, sterile drape, hand hygiene and skin antiseptic. A timeout was performed prior to the initiation of the procedure. The patient was position prone on the CT scanner. Limited CT through the pelvis demonstrated similar appearing multiloculated small gas containing fluid collections in the region of the rectum. A small collection in the left gluteal musculature was targeted. Subdermal Local anesthesia was provided at the planned needle entry site with 1% lidocaine. Deeper local anesthetic was administered under CT guidance to the periphery of the fluid collection. Small skin nick was made. Under intermittent CT guidance, an 18 gauge trocar needle was directed to the central aspect of the small fluid collection. Aspiration was performed which yielded approximately 3 mL of prelim fluid. The needle was removed. A sterile  bandage was applied. The patient tolerated the procedure well was transferred back to the floor in good condition. IMPRESSION: Technically successful CT-guided left gluteal abscess aspiration. Aspirate was sent to the lab for culture. Marliss Coots, MD [facility] Electronically Signed   By: Marliss Coots M.D.   On: 09/20/2023 21:07    Assessment/Plan:   Principal Problem:   Osteomyelitis of sacrum (HCC) Active Problems:   Crohn's proctitis, with fistula (HCC)   Anorectal fistula   CKD (chronic kidney disease) stage 3, GFR 30-59 ml/min (HCC)   Abscess, gluteal   Acute on chronic anemia   Patient Summary: Shawn [name] is a 38 y.o. M with PMH Crohn's colitis with anorectal fistula, prior coccyx osteomyelitis, macrocytic anemia, CKD stage 3a, who presented with 1 month of low back pain admitted for sacral abscess and sacral osteomyelitis.  # Osteomyelitis of sacrum # Coccyx osteomyelitis # Crohn's disease with anorectal fistula. # Chron's proctitis with  He is doing well, afebrile and  leukocytosis improving 15.1>>10.1. S/p CT guided drainage of the pelvic abscess  culture of the abscess NGTD.  ID following, have switched pt abx regimen from IV van/ctx/metro to Dap/ctx/metro. Normal CK on labs.  GI following, also recommending repeat CT/MRI 7 to 10 days for reevaluation of fluid collection.  Will also need updated hepatitis B labs and QuantiFERON gold to restart  biologic outpatient, orders already placed.  - GI and Infectious disease following, appreciate recs. - Cw Dap/ceftriaxone/metronidazole. - Follow-up with Cx from the abscess  - repeat CT/MRI 7-10 for re-evaluation of fluid collection. - Scheduled Tylenol, and Oxy 5 mg as needed  # Constipation No Bowel movement, will add senna to MiraLAX prescribed.    - Monitor.  # CKD 3A   SCr 1.60>> 1.73, baseline creatinine between 1.5-1.7.  - BMP - Avoid nephrotoxic  drugs  # Acute on chronic anemia. # History of vit B12 deficiency. Hb  8.8---> 8.0>> 82 MCV 85.4.  No signs of acutely bleeding.  Will workup anemia with folate, vitamin B12, iron and TIBC.  -Follow-up iron labs. -Transfuse if hemoglobin less than 7 -Cw vitamin B12 supplementation.  Diet: NPO. IVF:  None VTE: heparin injection 5,000 Units Start: 09/20/23 2200 Code: Full PT/OT: Pending ID:  Anti-infectives (From admission, onward)    Start     Dose/Rate Route Frequency Ordered Stop   09/20/23 2200  metroNIDAZOLE (FLAGYL) tablet 500 mg        500 mg Oral Every 12 hours 09/20/23 1600     09/20/23 1700  DAPTOmycin (CUBICIN) IVPB 700 mg/111mL premix        8 mg/kg  93 kg 200 mL/hr over 30 Minutes Intravenous Daily 09/20/23 1600  09/20/23 1700  cefTRIAXone (ROCEPHIN) 2 g in sodium chloride 0.9 % 100 mL IVPB        2 g 200 mL/hr over 30 Minutes Intravenous Every 24 hours 09/20/23 1600     09/19/23 1130  piperacillin-tazobactam (ZOSYN) IVPB 3.375 g  Status:  Discontinued        3.375 g 12.5 mL/hr over 240 Minutes Intravenous Every 8 hours 09/19/23 1103 09/19/23 1331   09/19/23 0745  ceFEPIme (MAXIPIME) 2 g in sodium chloride 0.9 % 100 mL IVPB  Status:  Discontinued        2 g 200 mL/hr over 30 Minutes Intravenous  Once 09/19/23 0743 09/19/23 0920   09/19/23 0745  vancomycin (VANCOCIN) IVPB 1000 mg/200 mL premix  Status:  Discontinued       Placed in "And" Linked Group   1,000 mg 200 mL/hr over 60 Minutes Intravenous  Once 09/19/23 0743 09/19/23 0920   09/19/23 0745  vancomycin (VANCOCIN) IVPB 1000 mg/200 mL premix       Placed in "And" Linked Group   1,000 mg 200 mL/hr over 60 Minutes Intravenous  Once 09/19/23 0743 09/19/23 1610        Anticipated discharge to pending medical stabilization.  Laretta Bolster, MD 09/21/2023, 6:05 AM Pager: [phone] [facility]  Please contact the on call pager after 5 pm and on weekends at [phone].

## 2023-09-21 NOTE — Consult Note (Addendum)
Referring Provider: No ref. provider found Primary Care Physician:  Hilbert Bible, FNP Primary Gastroenterologist:  Dr. Maren Beach  Reason for Consultation: Perianal fistulizing Crohn's disease complicated by presacral fluid collection and history of coccygeal osteomyelitis  HPI: Shawn [name] is a 38 y.o. 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  In speaking with Shawn [name] as well as reviewing his prior medical record, his IBD history is noteworthy for the following:  05/2020-coccygeal pain, CT imaging demonstrating perirectal stranding 10/2020-right groin abscess ?  Diagnosis of hidradenitis suppurativa 11/2020-rectal pain, IDA and CT showing rectal wall stranding 12/2020-colonoscopy demonstrating rectal inflammation in the distal, mid and proximal rectum; remainder of colon normal-pathology consistent with chronic colitis with focal activity in the rectum 03/2021 -EUA with incision and drainage of left perianal abscess; diagnosed with coccygeal osteomyelitis around this time 06/2021 -modified Hanley procedure and coccygectomy 07/2021 -completed antibiotic treatment with cefdinir metronidazole; induction infliximab 5 mg/kg with maintenance every 8 weeks 12/2021 - recurrent perianal fistula and abscess 03/2022 - therapeutic drug monitoring demonstrated nil level less than 0.4, high antibodies 1358 08/2022  - induction Stelara followed by maintenance 90 mg subcutaneously every 8 weeks 03/2023  -reports improved symptoms; colonoscopy showed diminished rectal inflammation now moderate in severity   During my interview today, Shawn [name] relates  that tolerated Stelara injections well and felt that the medication was helping throughout spring and summer 2024.  He discontinued the medication of his own volition and a 2024 as he was under the impression that his Crohn's disease was in remission and he no longer required any treatment.  Reports that he does not typically have luminal GI symptoms-no abdominal pain, diarrhea.  Endorses constipation when he has perianal discomfort and pain.  Denies extraintestinal manifestations of IBD although his chart comments on arthritis.  No history of immune deficiency or other unusual infections States that he had staph cellulitis in the past -no pneumonia, sinus infections HIV testing negative  He is now admitted to the hospital with worsening perianal pain and CT/MR imaging demonstrating an intact seton with fistula at the left side of the rectum and diffuse wall thickening of the anorectal junction down to the anus.  In conjunction with this there is a 7.3 x 4.0 x 1.5 cm presacral fluid collection.  Notation of increased marrow T2 hyperintensity suspicious for early osteomyelitis.  He underwent CT-guided aspiration of left gluteal abscess with cultures pending. Currently on antibiotic treatment with vancomycin, ceftriaxone and metronidazole Continues to experience rectal pain that is controlled with Tylenol and oxycodone.   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   PO B12 initiated 12/31/20   Anxiety    Arthritis    Chronic kidney disease    Crohn's colitis (HCC)    Dilated cardiomyopathy (HCC) 05/18/2013   By catheterization 2014    Fistula    GERD (gastroesophageal reflux disease)    Hypertension    Neuromuscular disorder (HCC)    Osteomyelitis (HCC)    tailbone    Past Surgical History:  Procedure Laterality Date   AV FISTULA REPAIR  07/07/2021   BIOPSY   01/01/2021  Procedure: BIOPSY;  Surgeon: Meryl Dare, MD;  Location: [facility] ENDOSCOPY;  Service: Endoscopy;;   COLONOSCOPY WITH PROPOFOL N/A 01/01/2021   Procedure: COLONOSCOPY WITH PROPOFOL;  Surgeon: Meryl Dare, MD;  Location: [facility] ENDOSCOPY;  Service: Endoscopy;  Laterality: N/A;   INCISION AND DRAINAGE ABSCESS N/A 07/07/2021   Procedure: MODIFIED HANLEY PROCEDURE, DRAINAGE WITH SETON PLACEMENT;  Surgeon: Karie Soda, MD;  Location: [facility] ORS;  Service: General;  Laterality: N/A;   LEFT AND RIGHT HEART CATHETERIZATION WITH CORONARY ANGIOGRAM N/A 11/21/2012   Procedure: LEFT AND RIGHT HEART CATHETERIZATION WITH CORONARY ANGIOGRAM;  Surgeon: Ricki Rodriguez, MD;  Location: [facility] CATH LAB;  Service: Cardiovascular;  Laterality: N/A;   MANDIBLE FRACTURE SURGERY  08/23/2004   PLACEMENT OF SETON N/A 07/07/2021   Procedure: PLACEMENT OF SETON;  Surgeon: Karie Soda, MD;  Location: [facility] ORS;  Service: General;  Laterality: N/A;   PLACEMENT OF SETON N/A 01/15/2022   Procedure: PLACEMENT OF SETON;  Surgeon: Karie Soda, MD;  Location: [facility] ORS;  Service: General;  Laterality: N/A;   RECTAL EXAM UNDER ANESTHESIA N/A 01/15/2022   Procedure: RECTAL EXAM UNDER ANESTHESIA;  Surgeon: Karie Soda, MD;  Location: [facility] ORS;  Service: General;  Laterality: N/A;  TRANSRECTAL DRAINAGE REPLACEMENT OF SETON EXCISION OF PERINEAL SINUS TRACT ANORECTAL EXAM UNDER ANESTHESIA    Prior to Admission medications   Medication Sig Start Date End Date Taking? Authorizing Provider  acetaminophen (TYLENOL) 500 MG tablet Take 2 tablets (1,000 mg total) by mouth every 8 (eight) hours as needed for moderate pain. 02/18/23  Yes Glyn Ade, MD  vitamin B-12 1000 MCG tablet Take 1 tablet (1,000 mcg total) by mouth daily. 07/10/21  Yes Karie Soda, MD  oxyCODONE-acetaminophen (PERCOCET/ROXICET) 5-325 MG tablet Take 1-2 tablets by mouth every 6 (six) hours as needed for severe pain (pain score 7-10). Patient not taking:  Reported on 09/19/2023 08/22/23   Rolan Bucco, MD  ustekinumab (STELARA) 90 MG/ML SOSY injection Inject 1 mL (90 mg total) into the skin every 8 (eight) weeks. no further refills unless appt is kept Patient not taking: Reported on 09/19/2023 01/19/23   Meryl Dare, MD    Current Facility-Administered Medications  Medication Dose Route Frequency Provider Last Rate Last Admin   acetaminophen (TYLENOL) tablet 1,000 mg  1,000 mg Oral Trecia Rogers, MD   1,000 mg at 09/21/23 1112   alum & mag hydroxide-simeth (MAALOX/MYLANTA) 200-200-20 MG/5ML suspension 30 mL  30 mL Oral Q6H PRN Karie Soda, MD       cefTRIAXone (ROCEPHIN) 2 g in sodium chloride 0.9 % 100 mL IVPB  2 g Intravenous Q24H Blanchard Kelch, NP   Stopped at 09/20/23 1711   DAPTOmycin (CUBICIN) IVPB 700 mg/116mL premix  8 mg/kg Intravenous Q1400 Blanchard Kelch, NP   Stopped at 09/20/23 1808   diazepam (VALIUM) tablet 5 mg  5 mg Oral Q8H PRN Karie Soda, MD       diphenhydrAMINE (BENADRYL) injection 12.5-25 mg  12.5-25 mg Intravenous Q6H PRN Karie Soda, MD       gadobutrol (GADAVIST) 1 MMOL/ML injection 9 mL  9 mL Intravenous Once PRN Dione Booze, MD       heparin injection 5,000 Units  5,000 Units Subcutaneous Q8H Ralene Muskrat, PA-C   5,000 Units at 09/21/23 0410   HYDROmorphone (DILAUDID) injection 1 mg  1 mg Intravenous Q4H PRN Rana Snare, DO   1 mg at 09/21/23 0809   lactated ringers bolus 1,000 mL  1,000 mL  Intravenous Q8H PRN Karie Soda, MD       lidocaine-EPINEPHrine (XYLOCAINE W/EPI) 1 %-1:100000 (with pres) injection 20 mL  20 mL Intradermal Once Suttle, Thressa Sheller, MD       magic mouthwash  15 mL Oral QID PRN Karie Soda, MD       menthol-cetylpyridinium (CEPACOL) lozenge 3 mg  1 lozenge Oral PRN Karie Soda, MD       methocarbamol (ROBAXIN) tablet 1,000 mg  1,000 mg Oral Q6H PRN Karie Soda, MD       metroNIDAZOLE (FLAGYL) tablet 500 mg  500 mg Oral Q12H Blanchard Kelch, NP   500 mg at  09/21/23 6045   naphazoline-glycerin (CLEAR EYES REDNESS) ophth solution 1-2 drop  1-2 drop Both Eyes QID PRN Karie Soda, MD       oxyCODONE (Oxy IR/ROXICODONE) immediate release tablet 5-10 mg  5-10 mg Oral Q4H PRN Karie Soda, MD   10 mg at 09/21/23 1112   phenol (CHLORASEPTIC) mouth spray 2 spray  2 spray Mouth/Throat PRN Karie Soda, MD       polycarbophil (FIBERCON) tablet 625 mg  625 mg Oral BID Karie Soda, MD   625 mg at 09/21/23 0810   polyethylene glycol (MIRALAX / GLYCOLAX) packet 17 g  17 g Oral BID Karie Soda, MD       polyethylene glycol (MIRALAX / GLYCOLAX) packet 34 g  34 g Oral Coralee North, MD   34 g at 09/21/23 1109   simethicone (MYLICON) 40 MG/0.6ML suspension 80 mg  80 mg Oral QID PRN Karie Soda, MD       sodium chloride (OCEAN) 0.65 % nasal spray 1-2 spray  1-2 spray Each Nare Q6H PRN Karie Soda, MD       Facility-Administered Medications Ordered in Other Encounters  Medication Dose Route Frequency Provider Last Rate Last Admin   bupivacaine liposome (EXPAREL) 1.3 % injection 266 mg  20 mL Infiltration Once Karie Soda, MD       Chlorhexidine Gluconate Cloth 2 % PADS 6 each  6 each Topical Once Karie Soda, MD       And   Chlorhexidine Gluconate Cloth 2 % PADS 6 each  6 each Topical Once Karie Soda, MD       feeding supplement (ENSURE PRE-SURGERY) liquid 296 mL  296 mL Oral Once Karie Soda, MD        Allergies as of 09/18/2023 - Review Complete 09/18/2023  Allergen Reaction Noted   Shrimp [shellfish allergy] Shortness Of Breath 05/16/2013   Nsaids Other (See Comments) 07/06/2021    Family History  Problem Relation Age of Onset   Diabetes Mother    Kidney failure Mother    Diabetes Sister    Colon cancer Neg Hx    Stomach cancer Neg Hx    Esophageal cancer Neg Hx    Pancreatic disease Neg Hx    Colon polyps Neg Hx    Rectal cancer Neg Hx     Social History   Socioeconomic History   Marital status: Single    Spouse name:  Not on file   Number of children: Not on file   Years of education: Not on file   Highest education level: Associate degree: academic program  Occupational History   Not on file  Tobacco Use   Smoking status: Former    Current packs/day: 0.25    Types: Cigarettes   Smokeless tobacco: Never  Vaping Use   Vaping status: Never Used  Substance  and Sexual Activity   Alcohol use: Not Currently    Comment: occ   Drug use: No   Sexual activity: Not on file  Other Topics Concern   Not on file  Social History Narrative   Not on file   Social Drivers of Health   Financial Resource Strain: Low Risk  (09/18/2023)   Overall Financial Resource Strain (CARDIA)    Difficulty of Paying Living Expenses: Not hard at all  Food Insecurity: No Food Insecurity (09/19/2023)   Hunger Vital Sign    Worried About Running Out of Food in the Last Year: Never true    Ran Out of Food in the Last Year: Never true  Transportation Needs: No Transportation Needs (09/19/2023)   PRAPARE - Administrator, Civil Service (Medical): No    Lack of Transportation (Non-Medical): No  Physical Activity: Sufficiently Active (09/18/2023)   Exercise Vital Sign    Days of Exercise per Week: 7 days    Minutes of Exercise per Session: 30 min  Stress: Stress Concern Present (09/18/2023)   [employer] of Occupational Health - Occupational Stress Questionnaire    Feeling of Stress : Very much  Social Connections: Socially Isolated (09/18/2023)   Social Connection and Isolation Panel [NHANES]    Frequency of Communication with Friends and Family: Three times a week    Frequency of Social Gatherings with Friends and Family: Never    Attends Religious Services: Never    Database administrator or Organizations: No    Attends Engineer, structural: Not on file    Marital Status: Never married  Intimate Partner Violence: Not At Risk (09/19/2023)   Humiliation, Afraid, Rape, and Kick questionnaire    Fear of  Current or Ex-Partner: No    Emotionally Abused: No    Physically Abused: No    Sexually Abused: No    Review of Systems: A targeted review of systems was performed-denies fevers, chills, abdominal pain, nausea, vomiting, weight loss  Physical Exam: Vital signs in last 24 hours: Temp:  [98.2 F (36.8 C)-99.9 F (37.7 C)] 98.4 F (36.9 C) (01/29 1055) Pulse Rate:  [76-89] 81 (01/29 1055) Resp:  [9-18] 18 (01/29 1055) BP: (100-119)/(63-78) 119/78 (01/29 1055) SpO2:  [97 %-100 %] 100 % (01/29 1055) Last BM Date : 09/14/23 General:   Alert,  Well-developed, well-nourished, pleasant and cooperative in NAD Head:  Normocephalic and atraumatic. Eyes:  Sclera clear, no icterus.   Conjunctiva pink. Neck:  Supple; no masses or thyromegaly. Lungs:  Clear throughout to auscultation.   No wheezes, crackles, or rhonchi.  Heart:  Regular rate and rhythm; no murmurs, clicks, rubs,  or gallops. Abdomen:  Soft,nontender, BS active,nonpalp mass or hsm.   Rectal: Resting in the left lateral decubitus position, there is a seton intact in the posterior anal region with minimal drainage; scarring is present over the right and left buttock related to prior procedures; there is induration, fluctuance and tenderness to palpation over the right perianal region Msk:  Symmetrical without gross deformities. . Extremities:  Without clubbing or edema. Neurologic:  Alert and  oriented x4;  grossly normal neurologically. Skin:  Intact without significant lesions or rashes.. Psych:  Alert and cooperative. Normal mood and affect.  Intake/Output from previous day: 01/28 0701 - 01/29 0700 In: 391 [P.O.:240; IV Piggyback:151] Out: -  Intake/Output this shift: Total I/O In: 250 [P.O.:250] Out: -   Lab Results: Recent Labs    09/18/23 1712 09/20/23 0620 09/21/23  0719  WBC 14.3* 15.1* 10.7*  HGB 8.8* 8.0* 8.2*  HCT 26.0* 24.1* 24.7*  PLT 574* 536* 527*   BMET Recent Labs    09/18/23 1712 09/20/23 0620  09/21/23 0719  NA 138 135 134*  K 4.2 4.8 4.4  CL 109 104 101  CO2 19* 22 20*  GLUCOSE 106* 99 128*  BUN 18 15 17   CREATININE 1.52* 1.67* 1.73*  CALCIUM 8.7* 8.4* 8.4*   LFT No results for input(s): "PROT", "ALBUMIN", "AST", "ALT", "ALKPHOS", "BILITOT", "BILIDIR", "IBILI" in the last 72 hours. PT/INR No results for input(s): "LABPROT", "INR" in the last 72 hours. Hepatitis Panel No results for input(s): "HEPBSAG", "HCVAB", "HEPAIGM", "HEPBIGM" in the last 72 hours.    Studies/Results:  MRI pelvis 09/19/2023 IMPRESSION: 1. Enlarging presacral fluid collection with peripheral enhancement suspicious for an abscess. There is asymmetric extension of peripherally enhancing fluid into the posterior aspect of the left greater sciatic notch. 2. Increased marrow T2 hyperintensity and enhancement within the adjacent mid to distal sacrum and coccyx suspicious for early osteomyelitis. 3. Presacral inflammatory changes extend into the adjacent piriformis and gluteus maximus muscles bilaterally with associated small intramuscular fluid collections. 4. No evidence of sacroiliitis or hip joint septic arthritis. ADDENDUM: The original report was by Dr. Roxy Horseman. The following addendum is by Dr. Gaylyn Rong:   I received a message from Estill Cotta, radiology assistant, that further comment on the rectum and concern for proctitis in this patient with Crohn's disease was requested on this exam performed on 09/19/2023. Dr. Purcell Mouton was not available and so this was passed to me.   In this context I have reviewed the appearance of the rectum for comment below. However, I have not reinterpreted the entire case or assessed other regions.   In addition to the perianal fistula with seton tube in place, there appears to be a fistula extending from the left side of the rectum (image [id number] series 13) at about the 3 o'clock position approximately 5 cm cephalad to the anorectal junction, and  extending directly posteriorly into the complex presacral collection. The collection has small abscess like extensions extending along the gluteal musculature. Multiple likely reactive perirectal lymph nodes are present.   There is primarily lower rectal diffuse wall thickening extending down to the anorectal junction and probably into the anus. The upper rectum does not appear substantially thickened.   If follow up imaging of the perirectal and perianal fistulas becomes indicated, consider dedicated perianal fistula protocol MRI which has some advantages in depicting fistulas in this region compared to standard pelvic MRI.     CT guided needle placement Result Date: 09/20/2023 INDICATION: 38 year old male with history of perianal abscess. EXAM: CT-guided aspiration MEDICATIONS: None. ANESTHESIA/SEDATION: Moderate (conscious) sedation was employed during this procedure. A total of Versed 1 mg and Fentanyl 75 mcg was administered intravenously by the radiology nurse. Total intra-service moderate Sedation Time: 6 minutes. The patient's level of consciousness and vital signs were monitored continuously by radiology nursing throughout the procedure under my direct supervision. COMPLICATIONS: None immediate. PROCEDURE: Informed written consent was obtained from the patient after a thorough discussion of the procedural risks, benefits and alternatives. All questions were addressed. Maximal Sterile Barrier Technique was utilized including caps, mask, sterile gowns, sterile gloves, sterile drape, hand hygiene and skin antiseptic. A timeout was performed prior to the initiation of the procedure. The patient was position prone on the CT scanner. Limited CT through the pelvis demonstrated similar appearing multiloculated small gas containing  fluid collections in the region of the rectum. A small collection in the left gluteal musculature was targeted. Subdermal Local anesthesia was provided at the planned  needle entry site with 1% lidocaine. Deeper local anesthetic was administered under CT guidance to the periphery of the fluid collection. Small skin nick was made. Under intermittent CT guidance, an 18 gauge trocar needle was directed to the central aspect of the small fluid collection. Aspiration was performed which yielded approximately 3 mL of prelim fluid. The needle was removed. A sterile bandage was applied. The patient tolerated the procedure well was transferred back to the floor in good condition. IMPRESSION: Technically successful CT-guided left gluteal abscess aspiration. Aspirate was sent to the lab for culture. Marliss Coots, MD [facility] Radiology Electronically Signed   By: Marliss Coots M.D.   On: 09/20/2023 21:07   CT PELVIS W CONTRAST Result Date: 09/19/2023 CLINICAL DATA:  R/O Pelvic abscess assess pre sacral collection for possible IR drainage EXAM: CT PELVIS WITH CONTRAST TECHNIQUE: Multidetector CT imaging of the pelvis was performed using the standard protocol following the bolus administration of intravenous contrast. RADIATION DOSE REDUCTION: This exam was performed according to the departmental dose-optimization program which includes automated exposure control, adjustment of the mA and/or kV according to patient size and/or use of iterative reconstruction technique. CONTRAST:  75mL OMNIPAQUE IOHEXOL 350 MG/ML SOLN COMPARISON:  MR pelvis, 09/19/2023.  CT AP, 08/22/2023. FINDINGS: Urinary Tract:  Minimal urinary bladder distention. Bowel:  Imaged portions of small bowel and colon are nondilated. Vascular/Lymphatic: No pathologically enlarged lymph nodes. No significant vascular abnormality seen. Reproductive:  Prostate is within normal limits. Other: Stable positioning of drain seton at the anorectal verge. Increased peri-rectal stranding with air and fluid tracking along the LEFT rectal wall, consistent with a persistent fistula-in-ano.  See key image. Small intramuscular rim-enhancing air-and fluid collections within the medial and deep margins of the gluteus maximus, largest measuring up to 1.5 cm RIGHT and 3.0 cm LEFT. Additional rim-enhancing air-fluid containing collection along the sacrum, measuring up to 2.0 x 4.0 x 6.0 cm (AP by transaxial by CC). No discrete underlying focal demineralization, erosion or periosteal change at the sacrum or coccyx. Musculoskeletal: Small fat-containing RIGHT inguinal hernia versus cord lipoma. No acute osseous abnormality IMPRESSION: 1. LEFT-sided persistent anorectal fistula with small intramuscular abscesses along the medial and deep margins of the gluteus maximus, as above. 2. Additional small abscess coursing longitudinally along the sacrum. No overt CT evidence of sacrococcygeal osteomyelitis. Electronically Signed   By: Roanna Banning M.D.   On: 09/19/2023 16:29   Endoscopic procedures Colonoscopy 04/06/2023 - Abnormal perianal exam fistula with seton in place.  - The examined portion of the ileum was normal. Biopsied. - Crohn's disease with colonic involvement. Inflammation was found from the anus to the rectum. This was moderate in severity, improved compared to previous examinations. Biopsied.  - The examination was otherwise normal on direct and retroflexion views.  Path 1. Surgical [P], small bowel, ileum ILEAL MUCOSA WITH PRESERVED VILLOGLANDULAR ARCHITECTURE WITHOUT INCREASED INTRAEPITHELIAL LYMPHOCYTES OR EVIDENCE OF ACTIVE INFLAMMATION. NEGATIVE FOR DYSPLASIA OR MALIGNANCY. 2. Surgical [P], random colon sites COLONIC MUCOSA WITH NO SIGNIFICANT DIAGNOSTIC ALTERATION. NO EVIDENCE OF LYMPHOCYTIC COLITIS OR COLLAGENOUS COLITIS. NO EVIDENCE OF ACTIVITY, CHRONICITY, GRANULOMA, DYSPLASIA OR MALIGNANCY. 3. Surgical [P], colon, rectum COLONIC MUCOSA WITH REACTIVE EPITHELIAL CHANGES AND LYMPHOID AGGREGATES. NEGATIVE FOR DYSPLASIA OR MALIGNANCY.  Colonoscopy 01/01/2021 - The examined  portion of the ileum was normal.  -  Congested, indurated, friable mucosa in the rectum. Biopsied. Path A. 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  He is admitted to the hospital with recurrent sacral abscesses and concern for possible osteomyelitis.  In terms of his IBD management, he was previously treated with infliximab 5 mg/kg every 8 weeks but unfortunately developed anti-TNF immunogenicity rendering the medication ineffective.  In January 2024 he started induction Stelara and was on maintenance 90 mg subcutaneously every 8 weeks.  He does not recall further dose optimization and no record of therapeutic drug monitoring.  He indicates having a beneficial response to the medication.  Restaging colonoscopy 03/2023 demonstrated moderate inflammation in the rectum improved from prior evaluation.  During today's interview, we discussed the possibility of reinstating Stelara in the outpatient setting given his previous beneficial response once his pelvic sepsis is better controlled.  Since he has been off of the medication for several months he would require reinduction followed by subsequent maintenance dosing which may need to be dose optimized to every 4 weeks depending upon his response and therapeutic drug monitoring.  We reviewed that other options for medical management could also include Skyrizi or Rinvoq.  Were he to have a partial response to Stelara could consider dual therapy with Stelara +  Rinvoq given his aggressive phenotype if it were approved by insurance.  PLAN: Continue current antibiotic management per ID recommendations Monitor clinical course and consider repeat CT or MR imaging in 7 to 10 days for reevaluation of fluid collections.  If they are improving can make a determination regarding timing of induction Stelara infusion.  Will begin outpatient process of insurance approval for Stelara induction infusion 520 mg IV x 1 followed by maintenance injections 90 mg subcutaneously every 8 weeks with further dose optimization Send updated prebiologic labs as previous HBV and QuantiFERON gold are almost 40 years old -hepatitis B surface antigen, hepatitis B surface antibody, hepatitis B core antibody total and QuantiFERON gold Follow serial CRP values to assess trend. Would hold off on IV steroids at this time until pelvic sepsis is better controlled Agree with anemia workup.  If consistent with iron deficiency anemia consider iron infusions and oral iron supplementation Shawn [name] is scheduled for follow-up in the gastroenterology clinic with me on 08/09/2024 and issues related to IBD health maintenance can be addressed at that time.  Thank you for this consult.  Please contact [facility] on-call for any additional questions.   Durene Romans Jayland Null  09/21/2023, 12:01 PM

## 2023-09-21 NOTE — Plan of Care (Signed)
  Problem: Education: Goal: Knowledge of General Education information will improve Description: Including pain rating scale, medication(s)/side effects and non-pharmacologic comfort measures Outcome: Progressing   Problem: Clinical Measurements: Goal: Ability to maintain clinical measurements within normal limits will improve Outcome: Progressing   Problem: Activity: Goal: Risk for activity intolerance will decrease Outcome: Progressing   Problem: Nutrition: Goal: Adequate nutrition will be maintained Outcome: Progressing   Problem: Elimination: Goal: Will not experience complications related to bowel motility Outcome: Progressing Goal: Will not experience complications related to urinary retention Outcome: Progressing   Problem: Pain Managment: Goal: General experience of comfort will improve and/or be controlled Outcome: Progressing   Problem: Safety: Goal: Ability to remain free from injury will improve Outcome: Progressing   Problem: Skin Integrity: Goal: Risk for impaired skin integrity will decrease Outcome: Progressing

## 2023-09-21 NOTE — Progress Notes (Addendum)
09/21/2023  Rogue Bussing [id number] 12-21-1985  CARE TEAM: PCP: Hilbert Bible, FNP  Outpatient Care Team: Patient Care Team: Caudle, Shelton Silvas, FNP as PCP - General (Family Medicine) Karie Soda, MD as Consulting Physician (Colon and Rectal Surgery) Comer, Belia Heman, MD as Consulting Physician (Infectious Diseases) Meryl Dare, MD (Inactive) as Consulting Physician (Gastroenterology)  Inpatient Treatment Team: Treatment Team:  Reymundo Poll, MD (Rounding), Imts Maurice March, MD Ccs, Md, MD Karie Soda, MD Arnaldo Natal, NP Danice Goltz, [place], [place] Vilma Meckel, RN Cedric Fishman, [facility] [place], Asencion Islam, RN Idamae Schuller, RN Rexford Maus, [facility] [place] [facility] Carlota Raspberry, [place]   Problem List:   Principal Problem:   Osteomyelitis of sacrum [facility]) Active Problems:   Crohn's proctitis, with fistula (HCC)   Anorectal fistula   CKD (chronic kidney disease) stage 3, GFR 30-59 ml/min (HCC)   Abscess, gluteal   Acute on chronic anemia   * No surgery found *  09/20/2023  IMPRESSION: Technically successful CT-guided left gluteal abscess aspiration. Aspirate was sent to the lab for culture.   Marliss Coots, MD   [facility]   [facility] Radiology     Electronically Signed   By: Marliss Coots M.D.   On: 09/20/2023 21:07     Assessment [facility] Stay = 2 days)      Crohn's disease with recurrent perirectal / gluteal abscesses and anorectal involvement.  Concern for uncontrolled Crohn's proctitis that seems worsening with draining sinus tracts and small presacral collection.    Plan:  Presacral collection not able for surgical drainage.  One IR evaluation.  Aspirated 3 mL.  It was that small.  No drain placed.  Follow-up on cultures.  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  No undrained fluid collections.  Concern for  osteomyelitis.  S/p coccygectomy w [place] perirectal I&D w setons 2022.  I will defer to infectious disease etc.  Discussed with the patient.  Again went over the reasonings of why he needs to stay on Crohn's medications.  He was under the assumption he did not have Crohn's anymore and which is why he is not been on anything since July, before his last colonoscopy.   To my view the MRI of the pelvis shows proctitis.  Interestingly radiology never mentions evaluating the rectum just the sacrum.  I reach back out to my [facility].  I think the patient benefit from some Crohn's treatment now.  I understand hesitancy to start a biologic at this moment with concerns of infection, but perhaps a course of steroids may be a reasonable compromise.  Dr. Myrtie Neither notes new [facility] partner coming with expertise in IBD.  Hopefully they can evaluate and comment.  I worry untreated that he will worsen and get to the point where he will need permanent fecal diversion.  I am hesitant to do that if he has evidence of any proctocolitis on the left side.  It looks like it is isolated mainly to the rectum.  Last colonoscopy noted mainly rectum only.  He is tolerating a solid diet.  Still constipated.  Not obstructed.  Will write for extra MiraLAX this morning to see if that can jumpstart things.  Surgery really does not have much to offer at this time and will follow more peripherally.        I reviewed Consultant ID notes, hospitalist notes, last 24 h vitals  and pain scores, last 48 h intake and output, last 24 h labs and trends, and last 24 h imaging results.  I have reviewed this patient's available data, including medical history, events of note, test results, etc as part of my evaluation.   A significant portion of that time was spent in counseling. Care during the described time interval was provided by me.  This care required high  level of medical decision making.   09/21/2023    Subjective: (Chief complaint)  Deep IR drainage done yesterday.  Patient sitting in bed.  Still with pain.  Passing gas but no bowel wound for a week.  Tolerating solid diet.  Objective:  Vital signs:  Vitals:   09/20/23 1538 09/20/23 2055 09/21/23 0022 09/21/23 0503  BP: 117/67 106/69 119/70 100/63  Pulse: 85 89 85 83  Resp: 17 18 18 18   Temp: 99.2 F (37.3 C) 98.2 F (36.8 C) 98.4 F (36.9 C) 99.9 F (37.7 C)  TempSrc: Oral Oral Oral Oral  SpO2: 99% 100% 98% 97%  Weight:      Height:        Last BM Date : 09/14/23  Intake/Output   Yesterday:  01/28 0701 - 01/29 0700 In: 391 [P.O.:240; IV Piggyback:151] Out: -  This shift:  No intake/output data recorded.  Bowel function:  Flatus: YES  BM:  No  Drain: Posterior midline Blue seton in place with scant coagulum drainage   Physical Exam:  General: Pt awake/alert in no acute distress Eyes: PERRL, normal EOM.  Sclera clear.  No icterus Neuro: CN II-XII intact w/o focal sensory/motor deficits. Lymph: No head/neck/groin lymphadenopathy Psych:  No delerium/psychosis/paranoia.  Oriented x 4 HENT: Normocephalic, Mucus membranes moist.  No thrush Neck: Supple, No tracheal deviation.  No obvious thyromegaly Chest: No pain to chest wall compression.  Good respiratory excursion.  No audible wheezing CV:  Pulses intact.  Regular rhythm.  No major extremity edema MS: Normal AROM mjr joints.  No obvious deformity Abdomen: Soft.  Nondistended.  Nontender.  No evidence of peritonitis.  No incarcerated hernias.  Rectal: Perianal scarring right anterior left anterior stable from prior drainage.  No open new sinuses.  Posterior midline blue vascular tape seton in place with scant mostly serous mildly thinly purulent drainage.  Intact sphincter tone.  I held off on internal exam.  Ext:   No deformity.  No mjr edema.  No cyanosis Skin: No petechiae / purpurea.  No major sores.  Warm and dry    Results:    Cultures: Recent Results (from the past 720 hours)  Culture, blood (Routine X 2) w Reflex to ID Panel     Status: None (Preliminary result)   Collection Time: 09/19/23  9:45 AM   Specimen: BLOOD RIGHT ARM  Result Value Ref Range Status   Specimen Description BLOOD RIGHT ARM  Final   Special Requests   Final    BOTTLES DRAWN AEROBIC AND ANAEROBIC Blood Culture results may not be optimal due to an inadequate volume of blood received in culture bottles   Culture   Final    NO GROWTH < 24 HOURS Performed at [facility], [address] [place], [place] [address]    Report Status PENDING  Incomplete  Culture, blood (Routine X 2) w Reflex to ID Panel     Status: None (Preliminary result)   Collection Time: 09/19/23  9:55 AM   Specimen: BLOOD LEFT ARM  Result Value Ref Range Status  Specimen Description BLOOD LEFT ARM  Final   Special Requests   Final    BOTTLES DRAWN AEROBIC ONLY Blood Culture results may not be optimal due to an inadequate volume of blood received in culture bottles   Culture   Final    NO GROWTH < 24 HOURS Performed at [facility], [address] [place], [place] [address]    Report Status PENDING  Incomplete  Aerobic/Anaerobic Culture w Gram Stain (surgical/deep wound)     Status: None (Preliminary result)   Collection Time: 09/20/23  2:51 PM   Specimen: Pelvis; Abscess  Result Value Ref Range Status   Specimen Description PELVIS ABSCESS  Final   Special Requests NONE  Final   Gram Stain   Final    MODERATE WBC PRESENT,BOTH PMN AND MONONUCLEAR NO ORGANISMS SEEN    Culture   Final    NO GROWTH < 24 HOURS Performed at [facility], [address], [place], [place] [address]    Report Status PENDING  Incomplete    Labs: Results for orders placed or performed during the hospital encounter of 09/18/23 (from the past 48 hours)  HIV Antibody (routine testing w rflx)     Status: None   Collection Time: 09/19/23  8:49 AM  Result Value Ref  Range   HIV Screen 4th Generation wRfx Non Reactive Non Reactive    Comment: Performed at [facility], [address] [place], [place] [address]  Culture, blood (Routine X 2) w Reflex to ID Panel     Status: None (Preliminary result)   Collection Time: 09/19/23  9:45 AM   Specimen: BLOOD RIGHT ARM  Result Value Ref Range   Specimen Description BLOOD RIGHT ARM    Special Requests      BOTTLES DRAWN AEROBIC AND ANAEROBIC Blood Culture results may not be optimal due to an inadequate volume of blood received in culture bottles   Culture      NO GROWTH < 24 HOURS Performed at [facility], [address] [place], [place] [address]    Report Status PENDING   Culture, blood (Routine X 2) w Reflex to ID Panel     Status: None (Preliminary result)   Collection Time: 09/19/23  9:55 AM   Specimen: BLOOD LEFT ARM  Result Value Ref Range   Specimen Description BLOOD LEFT ARM    Special Requests      BOTTLES DRAWN AEROBIC ONLY Blood Culture results may not be optimal due to an inadequate volume of blood received in culture bottles   Culture      NO GROWTH < 24 HOURS Performed at [facility], [address] [place], [place] [address]    Report Status PENDING   Basic metabolic panel     Status: Abnormal   Collection Time: 09/20/23  6:20 AM  Result Value Ref Range   Sodium 135 135 - 145 mmol/L   Potassium 4.8 3.5 - 5.1 mmol/L   Chloride 104 98 - 111 mmol/L   CO2 22 22 - 32 mmol/L   Glucose, Bld 99 70 - 99 mg/dL    Comment: Glucose reference range applies only to samples taken after fasting for at least 8 hours.   BUN 15 6 - 20 mg/dL   Creatinine, Ser 1.32 (H) 0.61 - 1.24 mg/dL   Calcium 8.4 (L) 8.9 - 10.3 mg/dL   GFR, Estimated 53 (L) >60 mL/min    Comment: (NOTE) Calculated  using the CKD-EPI Creatinine Equation (2021)    Anion gap 9 5 - 15    Comment: Performed at [facility], [address], [place], [place] [address]  CBC     Status: Abnormal   Collection  Time: 09/20/23  6:20 AM  Result Value Ref Range   WBC 15.1 (H) 4.0 - 10.5 K/uL   RBC 2.82 (L) 4.22 - 5.81 MIL/uL   Hemoglobin 8.0 (L) 13.0 - 17.0 g/dL   HCT 60.4 (L) 54.0 - 98.1 %   MCV 85.5 80.0 - 100.0 fL   MCH 28.4 26.0 - 34.0 pg   MCHC 33.2 30.0 - 36.0 g/dL   RDW 19.1 (H) 47.8 - 29.5 %   Platelets 536 (H) 150 - 400 K/uL   nRBC 0.0 0.0 - 0.2 %    Comment: Performed at [facility], [address], [place], [place] [address]  C-reactive protein     Status: Abnormal   Collection Time: 09/20/23  6:20 AM  Result Value Ref Range   CRP 18.8 (H) <1.0 mg/dL    Comment: Performed at [facility], [address] [place], [place] [address]  Sedimentation rate     Status: Abnormal   Collection Time: 09/20/23  6:20 AM  Result Value Ref Range   Sed Rate 137 (H) 0 - 16 mm/hr    Comment: Performed at [facility], [address], [place], [place] [address]  Aerobic/Anaerobic Culture w Gram Stain (surgical/deep wound)     Status: None (Preliminary result)   Collection Time: 09/20/23  2:51 PM   Specimen: Pelvis; Abscess  Result Value Ref Range   Specimen Description PELVIS ABSCESS    Special Requests NONE    Gram Stain      MODERATE WBC PRESENT,BOTH PMN AND MONONUCLEAR NO ORGANISMS SEEN    Culture      NO GROWTH < 24 HOURS Performed at [facility], [address], [place], [place] [address]    Report Status PENDING     Imaging / Studies: CT guided needle placement Result Date: 09/20/2023 INDICATION: 38 year old male with history of perianal abscess. EXAM: CT-guided aspiration MEDICATIONS: None. ANESTHESIA/SEDATION: Moderate (conscious) sedation was employed during this procedure. A total of Versed 1 mg and Fentanyl 75 mcg was administered intravenously by the radiology nurse. Total intra-service moderate Sedation Time: 6 minutes. The patient's level of consciousness and vital signs were monitored continuously by radiology nursing throughout the procedure under my  direct supervision. COMPLICATIONS: None immediate. PROCEDURE: Informed written consent was obtained from the patient after a thorough discussion of the procedural risks, benefits and alternatives. All questions were addressed. Maximal Sterile Barrier Technique was utilized including caps, mask, sterile gowns, sterile gloves, sterile drape, hand hygiene and skin antiseptic. A timeout was performed prior to the initiation of the procedure. The patient was position prone on the CT scanner. Limited CT through the pelvis demonstrated similar appearing multiloculated small gas containing fluid collections in the region of the rectum. A small collection in the left gluteal musculature was targeted. Subdermal Local anesthesia was provided at the planned needle entry site with 1% lidocaine. Deeper local anesthetic was administered under CT guidance to the periphery of the fluid collection. Small skin nick was made. Under intermittent CT guidance, an 18 gauge trocar needle was directed to the central aspect of the small fluid collection. Aspiration was performed which yielded approximately 3 mL of prelim fluid. The needle was removed. A sterile  bandage was applied. The patient tolerated the procedure well was transferred back to the floor in good condition. IMPRESSION: Technically successful CT-guided left gluteal abscess aspiration. Aspirate was sent to the lab for culture. Marliss Coots, MD [facility] Radiology Electronically Signed   By: Marliss Coots M.D.   On: 09/20/2023 21:07   CT PELVIS W CONTRAST Result Date: 09/19/2023 CLINICAL DATA:  R/O Pelvic abscess assess pre sacral collection for possible IR drainage EXAM: CT PELVIS WITH CONTRAST TECHNIQUE: Multidetector CT imaging of the pelvis was performed using the standard protocol following the bolus administration of intravenous contrast. RADIATION DOSE REDUCTION: This exam was performed according to the departmental  dose-optimization program which includes automated exposure control, adjustment of the mA and/or kV according to patient size and/or use of iterative reconstruction technique. CONTRAST:  75mL OMNIPAQUE IOHEXOL 350 MG/ML SOLN COMPARISON:  MR pelvis, 09/19/2023.  CT AP, 08/22/2023. FINDINGS: Urinary Tract:  Minimal urinary bladder distention. Bowel:  Imaged portions of small bowel and colon are nondilated. Vascular/Lymphatic: No pathologically enlarged lymph nodes. No significant vascular abnormality seen. Reproductive:  Prostate is within normal limits. Other: Stable positioning of drain seton at the anorectal verge. Increased peri-rectal stranding with air and fluid tracking along the LEFT rectal wall, consistent with a persistent fistula-in-ano. See key image. Small intramuscular rim-enhancing air-and fluid collections within the medial and deep margins of the gluteus maximus, largest measuring up to 1.5 cm RIGHT and 3.0 cm LEFT. Additional rim-enhancing air-fluid containing collection along the sacrum, measuring up to 2.0 x 4.0 x 6.0 cm (AP by transaxial by CC). No discrete underlying focal demineralization, erosion or periosteal change at the sacrum or coccyx. Musculoskeletal: Small fat-containing RIGHT inguinal hernia versus cord lipoma. No acute osseous abnormality IMPRESSION: 1. LEFT-sided persistent anorectal fistula with small intramuscular abscesses along the medial and deep margins of the gluteus maximus, as above. 2. Additional small abscess coursing longitudinally along the sacrum. No overt CT evidence of sacrococcygeal osteomyelitis. Electronically Signed   By: Roanna Banning M.D.   On: 09/19/2023 16:29    Medications / Allergies: per chart  Antibiotics: Anti-infectives (From admission, onward)    Start     Dose/Rate Route Frequency Ordered Stop   09/20/23 2200  metroNIDAZOLE (FLAGYL) tablet 500 mg        500 mg Oral Every 12 hours 09/20/23 1600     09/20/23 1700  DAPTOmycin (CUBICIN) IVPB 700  mg/165mL premix        8 mg/kg  93 kg 200 mL/hr over 30 Minutes Intravenous Daily 09/20/23 1600     09/20/23 1700  cefTRIAXone (ROCEPHIN) 2 g in sodium chloride 0.9 % 100 mL IVPB        2 g 200 mL/hr over 30 Minutes Intravenous Every 24 hours 09/20/23 1600     09/19/23 1130  piperacillin-tazobactam (ZOSYN) IVPB 3.375 g  Status:  Discontinued        3.375 g 12.5 mL/hr over 240 Minutes Intravenous Every 8 hours 09/19/23 1103 09/19/23 1331   09/19/23 0745  ceFEPIme (MAXIPIME) 2 g in sodium chloride 0.9 % 100 mL IVPB  Status:  Discontinued        2 g 200 mL/hr over 30 Minutes Intravenous  Once 09/19/23 0743 09/19/23 0920   09/19/23 0745  vancomycin (VANCOCIN) IVPB 1000 mg/200 mL premix  Status:  Discontinued       Placed in "And" Linked Group   1,000 mg 200 mL/hr over 60 Minutes Intravenous  Once 09/19/23  6213 09/19/23 0920   09/19/23 0745  vancomycin (VANCOCIN) IVPB 1000 mg/200 mL premix       Placed in "And" Linked Group   1,000 mg 200 mL/hr over 60 Minutes Intravenous  Once 09/19/23 0743 09/19/23 0865         Note: Portions of this report may have been transcribed using voice recognition software. Every effort was made to ensure accuracy; however, inadvertent computerized transcription errors may be present.   Any transcriptional errors that result from this process are unintentional.    Ardeth Sportsman, MD, FACS, MASCRS Esophageal, Gastrointestinal & Colorectal Surgery Robotic and Minimally Invasive Surgery  [facility] [address] [place], [place] [address] [phone] Fax [phone] Main  CONTACT INFORMATION: Weekday (9AM-5PM): Call [facility] main office at [phone] Weeknight (5PM-9AM) or Weekend/Holiday: Check EPIC "Web Links" tab & use "AMION" (password " [email or web address]") for General Surgery [facility] coverage  Please, DO NOT use SecureChat  (it is not reliable communication to reach operating surgeons & will  lead to a delay in care).   Epic staff messaging available for outptient concerns needing 1-2 business day response.      09/21/2023  7:44 AM

## 2023-09-21 NOTE — Progress Notes (Signed)
Mobility Specialist Progress Note:   09/21/23 1625  Mobility  Activity Ambulated independently in hallway  Level of Assistance Modified independent, requires aide device or extra time  Assistive Device  (IV Pole)  Distance Ambulated (ft) 1110 ft  Activity Response Tolerated well  Mobility Referral Yes  Mobility visit 1 Mobility  Mobility Specialist Start Time (ACUTE ONLY) 1615  Mobility Specialist Stop Time (ACUTE ONLY) 1626  Mobility Specialist Time Calculation (min) (ACUTE ONLY) 11 min   Pt received in bed, eager to mobility. C/o slight back pain, otherwise asx throughout. When returning to room pt requesting to use BR. Pt left in BR with all needs met. RN notified.   Leory Plowman  Mobility Specialist Please contact via [employer] office at [phone]

## 2023-09-21 NOTE — TOC Progression Note (Signed)
Transition of Care [facility]) - Progression Note    Patient Details  Name: Shawn [name] MRN: [id number] Date of Birth: 1986-02-17  Transition of Care [facility]) CM/SW Contact  Nadene Rubins Adria Devon, RN Phone Number: 09/21/2023, 3:03 PM  Clinical Narrative:     In progression discussed patient may need IV ABX at home after discharge. Pam with [employer] following and can provide nurse. Patient will be taught IV ABX administration prior to hospital discharge.    Expected Discharge Plan:  (await treatment plan) Barriers to Discharge: Continued Medical Work up  Expected Discharge Plan and Services   Discharge Planning Services: CM Consult   Living arrangements for the past 2 months: Single Family Home                                       Social Determinants of Health (SDOH) Interventions SDOH Screenings   Food Insecurity: No Food Insecurity (09/19/2023)  Housing: Low Risk  (09/19/2023)  Transportation Needs: No Transportation Needs (09/19/2023)  Utilities: Not At Risk (09/19/2023)  Alcohol Screen: Low Risk  (09/18/2023)  Depression (PHQ2-9): Medium Risk (01/05/2023)  Financial Resource Strain: Low Risk  (09/18/2023)  Physical Activity: Sufficiently Active (09/18/2023)  Social Connections: Socially Isolated (09/18/2023)  Stress: Stress Concern Present (09/18/2023)  Tobacco Use: Medium Risk (09/18/2023)    Readmission Risk Interventions     No data to display

## 2023-09-21 NOTE — Discharge Instructions (Addendum)
FOLLOW-UP INSTRUCTIONS:  Thank you for allowing [place] to be part of your care. You were hospitalized for due to severe rectal pain, infection and abscess, abscess was drained. You were treated with antibiotics. You will need to continue IV antibiotics at home to treat infection.   Please follow up with the following providers: A. Primary Care: Hilbert Bible, FNP, [address] / [place] [address], [phone] on 10/07/23 10:50 AM B. GI Doctor: Ottie Glazier, MD on 10/11/2023 11:10 AM  C. Infectious Disease Doctor: Gardiner Barefoot, MD on 10/05/2023 3:30 PM   Please note these changes made to your medications:   A. Medications to continue:  -Tylenol 1000 mg every 8 hours as needed for pain  B. Medications to start:  -Daptomycin 700 mg into vein daily (End date 11/01/23)  -Ceftriaxone 2 g into vein daily (End date 11/01/23)  -Flagyl 500 mg by mouth twice a day (End date 11/01/23)  -B12 1000 mcg injection weekly  -hydrocortisone 25 mg suppository with lidocaine jelly at bedtime   -Oxycodone 10 mg every 8 hours as needed (severe pain)   -Senna 2 tablets daily   C. Medications to discontinue: None  Please make sure to return to the hospital if you have worsening abdominal pain, or worsening rectal drainage.  Please make sure to go to your follow up appointments!

## 2023-09-21 NOTE — Telephone Encounter (Signed)
-----   Message from Durene Romans McGreal sent at 09/21/2023  1:50 PM EST ----- Regarding: Stelara induction infusion and maintenance injections Hi Dottie -  I am touching base for your help coordinating a Stelara reinduction infusion and maintenance injections.  Mr. Thorstenson was previously followed by Dr. Russella Dar.  From what I can tell in the records he received a Stelara induction infusion in January 2024 and was subsequently on maintenance injections 90 mg subcutaneously every 8 weeks.  He stopped the medication of his own volition over the summer because he was under the misperception that his Crohn's was under control and he no longer needed to be on medication.  He is now in the hospital with recurrent perianal abscesses and will need to restart Stelara in the future when his abscesses are better controlled.  Dr. Myrtie Neither indicated that you could assist with orders.  Please order the following: 1.  Stelara induction infusion 520 mg IV x 1 2.  Stelara maintenance injections-Stelara 90 mg subcutaneously every 8 weeks  Please DO NOT schedule the induction infusion yet as I need to wait for his perianal abscesses to improve before he can and resume immune suppression.  We just need insurance approval at this time and can schedule the infusion at a future date once imaging and labs confirm that his abscesses are resolving.  Please let me know if you have any questions.  Thanks,  Harriett Sine

## 2023-09-21 NOTE — Progress Notes (Addendum)
[facility] Infectious Diseases Follow Up Note  Patient Identification: Patient Name: Shawn [name] MRN: [id number] Admit Date: 09/18/2023  4:53 PM Age: 38 y.o.Today's Date: 09/21/2023  Reason for Visit: Pelvic abscess/osteomyelitis  Principal Problem:   Osteomyelitis of sacrum (HCC) Active Problems:   Crohn's proctitis, with fistula (HCC)   Anorectal fistula   CKD (chronic kidney disease) stage 3, GFR 30-59 ml/min (HCC)   Abscess, gluteal   Acute on chronic anemia   Antibiotics:  Vancomycin/Zosyn on 1/27, which was held for IR procedure 1/28 daptomycin/ceftriaxone/metronidazole started post IR procedure  Lines/Hardwares:   Interval Events: Afebrile in the last 24 hours, no labs today   Assessment 7 Y O male with h/o crohn's disease complicated with perirectal abscess anorectal fistula and prior complicated h/o coccygeal osteomyelitis  s/p  I and D of perirectal abscess in 03/2021, modified hanley procedure with drainage + seton placement  in 07/07/2021 ( OR Cx E. coli resistant to ampicillin and Unasyn, E faecalis) s/p 4 weeks of abtx completed 08/05/21 followed by excision of perineal tract and seton replacement in 01/15/22 admitted with worsening back pain.   # MRI concerning for presacral abscess with sacrococcygeal osteomyelitis and associated intramuscular abscesses - seen by Dr Michaell Cowing surgery, reviewed his notes from 1/27.  He is concerned about recurrent Crohn's disease causing recurrent fistula problems and and thinks that GI should be consulted in-house.  However, GI is concerned about starting biologic which the potential infection, declined inpatient consultation and recommended close outpatient follow-up. 1/28 CT-guided left gluteal abscess aspiration. Cx sent   # Crohn's colitis complicated with persistent anorectal fistula- not on biologics or steroids and has not followed with GI OP after last colonoscopy in August  2024 by Dr Russella Dar  Recommendations -Continue daptomycin, ceftriaxone and metronidazole -Fu IR aspirate cx  -agree that GI evaluation wound be beneficial in this complex situation.  - Monitor CBC, CMP   Addendum; reviewed surgery and GI notes from today.   Rest of the management as per the primary team. Thank you for the consult. Please page with pertinent questions or concerns.  ______________________________________________________________________ Subjective patient seen and examined at the bedside. 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   PO B12 initiated 12/31/20   Anxiety    Arthritis    Chronic kidney disease    Crohn's colitis (HCC)    Dilated cardiomyopathy (HCC) 05/18/2013   By catheterization 2014    Fistula    GERD (gastroesophageal reflux disease)    Hypertension    Neuromuscular disorder (HCC)    Osteomyelitis (HCC)    tailbone   Past Surgical History:  Procedure Laterality Date   AV FISTULA REPAIR  07/07/2021   BIOPSY  01/01/2021   Procedure: BIOPSY;  Surgeon: Meryl Dare, MD;  Location: [facility] ENDOSCOPY;  Service: Endoscopy;;   COLONOSCOPY WITH PROPOFOL N/A 01/01/2021   Procedure: COLONOSCOPY WITH PROPOFOL;  Surgeon: Meryl Dare, MD;  Location: [facility] ENDOSCOPY;  Service: Endoscopy;  Laterality: N/A;   INCISION AND DRAINAGE ABSCESS N/A 07/07/2021   Procedure: MODIFIED HANLEY PROCEDURE, DRAINAGE WITH SETON PLACEMENT;  Surgeon: Karie Soda, MD;  Location: [facility] ORS;  Service: General;  Laterality: N/A;   LEFT AND RIGHT HEART CATHETERIZATION WITH CORONARY ANGIOGRAM N/A 11/21/2012   Procedure: LEFT AND RIGHT HEART CATHETERIZATION WITH CORONARY ANGIOGRAM;  Surgeon: Ricki Rodriguez, MD;  Location: [facility] CATH LAB;  Service: Cardiovascular;  Laterality: N/A;  MANDIBLE FRACTURE SURGERY  08/23/2004   PLACEMENT OF SETON N/A 07/07/2021   Procedure: PLACEMENT OF SETON;  Surgeon:  Karie Soda, MD;  Location: [facility] ORS;  Service: General;  Laterality: N/A;   PLACEMENT OF SETON N/A 01/15/2022   Procedure: PLACEMENT OF SETON;  Surgeon: Karie Soda, MD;  Location: [facility] ORS;  Service: General;  Laterality: N/A;   RECTAL EXAM UNDER ANESTHESIA N/A 01/15/2022   Procedure: RECTAL EXAM UNDER ANESTHESIA;  Surgeon: Karie Soda, MD;  Location: [facility] ORS;  Service: General;  Laterality: N/A;  TRANSRECTAL DRAINAGE REPLACEMENT OF SETON EXCISION OF PERINEAL SINUS TRACT ANORECTAL EXAM UNDER ANESTHESIA   Vitals BP 100/63 (BP Location: Right Arm)   Pulse 83   Temp 99.9 F (37.7 C) (Oral)   Resp 18   Ht 6\' 1"  (1.854 m)   Wt 93 kg   SpO2 97%   BMI 27.05 kg/m     Physical Exam Constitutional:  adult male lying in the left lateral position, not in acute distress    Comments: HEENT wnl  Cardiovascular:     Rate and Rhythm: Normal rate and regular rhythm.     Heart sounds s1s2  Pulmonary:     Effort: Pulmonary effort is normal.     Comments:   Abdominal:     Palpations: Abdomen is soft.     Tenderness: non distended and non tender   Musculoskeletal:        General: No swelling or tenderness in peripheral joints  Skin:    Comments: no rashes   GU ( chaperoned) Scarring in the b/l buttock, seton in the posterior anal region. Some tenderness and induration in the rt perianal region  Neurological:     General: No focal deficit present.   Psychiatric:        Mood and Affect: Mood normal.   Pertinent Microbiology Results for orders placed or performed during the hospital encounter of 09/18/23  Culture, blood (Routine X 2) w Reflex to ID Panel     Status: None (Preliminary result)   Collection Time: 09/19/23  9:45 AM   Specimen: BLOOD RIGHT ARM  Result Value Ref Range Status   Specimen Description BLOOD RIGHT ARM  Final   Special Requests   Final    BOTTLES DRAWN AEROBIC AND ANAEROBIC Blood Culture results may not be optimal due to an inadequate volume of blood  received in culture bottles   Culture   Final    NO GROWTH < 24 HOURS Performed at [facility], [address], [place], [place] [address]    Report Status PENDING  Incomplete  Culture, blood (Routine X 2) w Reflex to ID Panel     Status: None (Preliminary result)   Collection Time: 09/19/23  9:55 AM   Specimen: BLOOD LEFT ARM  Result Value Ref Range Status   Specimen Description BLOOD LEFT ARM  Final   Special Requests   Final    BOTTLES DRAWN AEROBIC ONLY Blood Culture results may not be optimal due to an inadequate volume of blood received in culture bottles   Culture   Final    NO GROWTH < 24 HOURS Performed at [facility], [address], [place], [place] [address]    Report Status PENDING  Incomplete  Aerobic/Anaerobic Culture w Gram Stain (surgical/deep wound)     Status: None (Preliminary result)   Collection Time: 09/20/23  2:51 PM   Specimen: Pelvis; Abscess  Result Value Ref Range Status   Specimen  Description PELVIS ABSCESS  Final   Special Requests NONE  Final   Gram Stain   Final    MODERATE WBC PRESENT,BOTH PMN AND MONONUCLEAR NO ORGANISMS SEEN    Culture   Final    NO GROWTH < 24 HOURS Performed at [facility], [address] [place], [place] [address]    Report Status PENDING  Incomplete   Pertinent Lab.    Latest Ref Rng & Units 09/20/2023    6:20 AM 09/18/2023    5:12 PM 08/22/2023   10:53 AM  CBC  WBC 4.0 - 10.5 K/uL 15.1  14.3    Hemoglobin 13.0 - 17.0 g/dL 8.0  8.8  45.4   Hematocrit 39.0 - 52.0 % 24.1  26.0  32.0   Platelets 150 - 400 K/uL 536  574        Latest Ref Rng & Units 09/20/2023    6:20 AM 09/18/2023    5:12 PM 08/22/2023   10:53 AM  CMP  Glucose 70 - 99 mg/dL 99  098  95   BUN 6 - 20 mg/dL 15  18  23    Creatinine 0.61 - 1.24 mg/dL 1.19  1.47  8.29   Sodium 135 - 145 mmol/L 135  138  140   Potassium 3.5 - 5.1 mmol/L 4.8  4.2  4.3   Chloride 98 - 111 mmol/L 104  109  111   CO2 22 - 32 mmol/L 22  19    Calcium  8.9 - 10.3 mg/dL 8.4  8.7       Pertinent Imaging today Plain films and CT images have been personally visualized and interpreted; radiology reports have been reviewed. Decision making incorporated into the Impression /   CT guided needle placement Result Date: 09/20/2023 INDICATION: 37 year old male with history of perianal abscess. EXAM: CT-guided aspiration MEDICATIONS: None. ANESTHESIA/SEDATION: Moderate (conscious) sedation was employed during this procedure. A total of Versed 1 mg and Fentanyl 75 mcg was administered intravenously by the radiology nurse. Total intra-service moderate Sedation Time: 6 minutes. The patient's level of consciousness and vital signs were monitored continuously by radiology nursing throughout the procedure under my direct supervision. COMPLICATIONS: None immediate. PROCEDURE: Informed written consent was obtained from the patient after a thorough discussion of the procedural risks, benefits and alternatives. All questions were addressed. Maximal Sterile Barrier Technique was utilized including caps, mask, sterile gowns, sterile gloves, sterile drape, hand hygiene and skin antiseptic. A timeout was performed prior to the initiation of the procedure. The patient was position prone on the CT scanner. Limited CT through the pelvis demonstrated similar appearing multiloculated small gas containing fluid collections in the region of the rectum. A small collection in the left gluteal musculature was targeted. Subdermal Local anesthesia was provided at the planned needle entry site with 1% lidocaine. Deeper local anesthetic was administered under CT guidance to the periphery of the fluid collection. Small skin nick was made. Under intermittent CT guidance, an 18 gauge trocar needle was directed to the central aspect of the small fluid collection. Aspiration was performed which yielded approximately 3 mL of prelim fluid. The needle was removed. A sterile bandage was applied. The  patient tolerated the procedure well was transferred back to the floor in good condition. IMPRESSION: Technically successful CT-guided left gluteal abscess aspiration. Aspirate was sent to the lab for culture. Marliss Coots, MD [facility] Radiology Electronically Signed   By: Marliss Coots M.D.   On: 09/20/2023  21:07   CT PELVIS W CONTRAST Result Date: 09/19/2023 CLINICAL DATA:  R/O Pelvic abscess assess pre sacral collection for possible IR drainage EXAM: CT PELVIS WITH CONTRAST TECHNIQUE: Multidetector CT imaging of the pelvis was performed using the standard protocol following the bolus administration of intravenous contrast. RADIATION DOSE REDUCTION: This exam was performed according to the departmental dose-optimization program which includes automated exposure control, adjustment of the mA and/or kV according to patient size and/or use of iterative reconstruction technique. CONTRAST:  75mL OMNIPAQUE IOHEXOL 350 MG/ML SOLN COMPARISON:  MR pelvis, 09/19/2023.  CT AP, 08/22/2023. FINDINGS: Urinary Tract:  Minimal urinary bladder distention. Bowel:  Imaged portions of small bowel and colon are nondilated. Vascular/Lymphatic: No pathologically enlarged lymph nodes. No significant vascular abnormality seen. Reproductive:  Prostate is within normal limits. Other: Stable positioning of drain seton at the anorectal verge. Increased peri-rectal stranding with air and fluid tracking along the LEFT rectal wall, consistent with a persistent fistula-in-ano. See key image. Small intramuscular rim-enhancing air-and fluid collections within the medial and deep margins of the gluteus maximus, largest measuring up to 1.5 cm RIGHT and 3.0 cm LEFT. Additional rim-enhancing air-fluid containing collection along the sacrum, measuring up to 2.0 x 4.0 x 6.0 cm (AP by transaxial by CC). No discrete underlying focal demineralization, erosion or periosteal change at the sacrum or  coccyx. Musculoskeletal: Small fat-containing RIGHT inguinal hernia versus cord lipoma. No acute osseous abnormality IMPRESSION: 1. LEFT-sided persistent anorectal fistula with small intramuscular abscesses along the medial and deep margins of the gluteus maximus, as above. 2. Additional small abscess coursing longitudinally along the sacrum. No overt CT evidence of sacrococcygeal osteomyelitis. Electronically Signed   By: Roanna Banning M.D.   On: 09/19/2023 16:29   MR PELVIS W WO CONTRAST Result Date: 09/19/2023 CLINICAL DATA:  Low back pain. History of perianal abscess/fistula. Clinical concern for osteomyelitis. EXAM: MRI PELVIS WITHOUT AND WITH CONTRAST TECHNIQUE: Multiplanar multisequence MR imaging of the pelvis was performed both before and after administration of intravenous contrast. CONTRAST:  9mL GADAVIST GADOBUTROL 1 MMOL/ML IV SOLN COMPARISON:  MRI pelvis 05/16/2021, CT pelvis 08/22/2023 and lumbar MRI 09/19/2023. FINDINGS: Bones/Joint/Cartilage There are chronic perirectal and presacral inflammatory changes which have progressed from previous MRI. Recent CT demonstrated a perianal fistula with seton tube in place. A presacral fluid collection with a small amount of internal air and heterogeneous surrounding enhancement appears enlarged from the most recent CT, measuring up to 7.3 x 4.0 x 1.5 cm. Appearance is suspicious for a presacral abscess. There is increased marrow T2 hyperintensity and enhancement within the adjacent mid to distal sacrum and coccyx suspicious for early osteomyelitis. No gross cortical destruction identified. The sacroiliac joints appear intact. No significant hip joint effusion or abnormal synovial enhancement. The remainder of the bony pelvis appears intact. Ligaments No significant ligamentous abnormalities are identified. Muscles and Tendons Presacral inflammatory changes extend into the adjacent piriformis and gluteus maximus muscles bilaterally. There are associated small  intramuscular fluid collections with peripheral enhancement. The common hamstring and iliopsoas tendons appear unremarkable. Soft tissue As above, enlarging complex presacral fluid collection with peripheral enhancement suspicious for an abscess. There is asymmetric extension of peripherally enhancing fluid into the posterior aspect of the left greater sciatic notch, measuring 2.0 x 1.3 x 1.3 cm. Grossly stable underlying rectal wall thickening. IMPRESSION: 1. Enlarging presacral fluid collection with peripheral enhancement suspicious for an abscess. There is asymmetric extension of peripherally enhancing fluid into the posterior aspect of the left greater sciatic notch.  2. Increased marrow T2 hyperintensity and enhancement within the adjacent mid to distal sacrum and coccyx suspicious for early osteomyelitis. 3. Presacral inflammatory changes extend into the adjacent piriformis and gluteus maximus muscles bilaterally with associated small intramuscular fluid collections. 4. No evidence of sacroiliitis or hip joint septic arthritis. Electronically Signed   By: Carey Bullocks M.D.   On: 09/19/2023 08:57   MR Lumbar Spine W Wo Contrast Result Date: 09/19/2023 CLINICAL DATA:  38 year old male with back pain radiating down the left leg. Dizziness, shortness of breath. History of perianal abscess. EXAM: MRI LUMBAR SPINE WITHOUT AND WITH CONTRAST TECHNIQUE: Multiplanar and multiecho pulse sequences of the lumbar spine were obtained without and with intravenous contrast. CONTRAST:  9mL GADAVIST GADOBUTROL 1 MMOL/ML IV SOLN COMPARISON:  Thoracic MRI today reported separately. CT Abdomen and Pelvis 08/22/2023. Pelvis MRI today reported separately. FINDINGS: Segmentation:  Normal, concordant with the thoracic numbering today. Alignment: Stable to mildly improved lumbar lordosis compared to the CT last month. Vertebrae: Generalized nonspecific decreased bone marrow signal in the visible spine and pelvis. Stable lumbar  vertebral height. No marrow edema identified in the lumbar spine. However, there is patchy abnormal marrow edema and enhancement identified in the lower sacrum beginning at the S3 central vertebral level (series 8, image 8 and series 12, image 8). Upper sacral segments, upper sacral ala and SI joints appear spared. Conus medullaris and cauda equina: Conus better demonstrated on the thoracic MRI today reported separately. Minimally visible on these images. Cauda equina nerve roots appear to remain normal. No lumbar abnormal intrathecal enhancement or dural thickening. Sacral epidural lipomatosis. Paraspinal and other soft tissues: Negative lumbar paraspinal soft tissues. Visible abdominal viscera stable from the CT Abdomen and Pelvis last month including heterogeneous appearance of the kidneys. Partially visible abnormal presacral enhancement and loculated appearing fluid or edema (series 12, image 8 and series 8, image 8). This extends up to the ventral lower S2 sacral level. Disc levels: No age advanced lumbar spine degeneration or spinal stenosis is identified. IMPRESSION: 1. Evidence of lower Sacral Osteomyelitis with presacral Abscess, Phlegmon: Partially visible abnormal sacrum beginning at the S3 body with marrow edema and enhancement. And abnormal presacral soft tissue fluid, edema, and enhancement. See further details on Pelvis MRI today reported separately. 2. No acute or inflammatory process identified in the lumbar spine. No lumbar spinal stenosis. Electronically Signed   By: Odessa Fleming M.D.   On: 09/19/2023 06:45   MR THORACIC SPINE W WO CONTRAST Result Date: 09/19/2023 CLINICAL DATA:  38 year old male with back pain radiating down the left leg. Dizziness, shortness of breath. History of perianal abscess. EXAM: MRI THORACIC WITHOUT AND WITH CONTRAST TECHNIQUE: Multiplanar and multiecho pulse sequences of the thoracic spine were obtained without and with intravenous contrast. CONTRAST:  9mL GADAVIST  GADOBUTROL 1 MMOL/ML IV SOLN COMPARISON:  CTA chest 04/03/2023. FINDINGS: Limited cervical spine imaging:  Minimally included. Thoracic spine segmentation:  Normal on the comparison CTA. Alignment: Stable thoracic kyphosis compared to last year. No significant scoliosis or spondylolisthesis. Vertebrae: Generalized decreased T1 marrow signal, nonspecific. Bone mineralization within normal limits by CT last year. Stable, generally maintained vertebral body height. No marrow edema or evidence of acute osseous abnormality. No abnormal enhancement identified. Cord: No thoracic spinal cord signal abnormality. Maintained cord volume. Conus medullaris appears normal at T11-T12. No abnormal intradural enhancement. No dural thickening. Paraspinal and other soft tissues: Negative. Negative visualized posterior paraspinal soft tissues. Disc levels: Negative aside from T3-T4: Moderate to severe facet  and/or ligament flavum hypertrophy on the right (series 20, image 14). Mild spinal stenosis. Moderate right T3 neural foraminal stenosis. T4-T5: Moderate similar right ligament flavum hypertrophy. Borderline to mild stenosis. T5-T6: Mild left side ligament flavum hypertrophy (series 20, image 22). No stenosis. T9-T10: Mild to moderate bilateral ligament flavum hypertrophy greater on the left (series 20, image 39). Mild spinal stenosis. Moderate to severe left and mild to moderate right T9 foraminal stenosis. T10-T11: Subtle posterior disc bulging. Mild to moderate facet and/or ligament flavum hypertrophy. Mild spinal stenosis (series 20, image 45). No significant spinal cord mass effect. Mild to moderate bilateral T10 foraminal stenosis. T11-T12: Moderate facet hypertrophy greater on the left. No spinal stenosis but moderate to severe left T11 foraminal stenosis. T12-L1: Mild to moderate left ligament flavum hypertrophy. Moderate to severe left T12 foraminal stenosis. IMPRESSION: 1. No acute or inflammatory abnormality in the  Thoracic Spine. 2. Multilevel thoracic spine degeneration chiefly in the form of posterior element hypertrophy. Isolated mild thoracic spinal stenosis at T10-T11 when combined with disc bulging there. No thoracic spinal cord mass effect or signal abnormality. Moderate to severe associated neural foraminal stenosis at the right T3, right T9, left T11 and T12 nerve levels. Electronically Signed   By: Odessa Fleming M.D.   On: 09/19/2023 06:37   CT ABDOMEN PELVIS W CONTRAST Result Date: 08/22/2023 CLINICAL DATA:  Pt c.o left lower back pain with tingling in both legs since last week. LLQ abdominal pain also sacral pain, hx of osteo of coccyx EXAM: CT ABDOMEN AND PELVIS WITH CONTRAST TECHNIQUE: Multidetector CT imaging of the abdomen and pelvis was performed using the standard protocol following bolus administration of intravenous contrast. RADIATION DOSE REDUCTION: This exam was performed according to the departmental dose-optimization program which includes automated exposure control, adjustment of the mA and/or kV according to patient size and/or use of iterative reconstruction technique. CONTRAST:  65mL OMNIPAQUE IOHEXOL 350 MG/ML SOLN COMPARISON:  CT AP, 04/03/2023 and 01/23/2023. FINDINGS: Lower chest: No acute abnormality. Hepatobiliary: No focal liver abnormality. No gallstones, gallbladder wall thickening, or biliary dilatation. Pancreas: No pancreatic ductal dilatation or surrounding inflammatory changes. Spleen: Normal in size without focal abnormality. Adrenals/Urinary Tract: Adrenal glands are unremarkable. BILATERAL renal cystic lesions, largest measuring 2.5 cm RIGHT and 3.8 cm LEFT. No dedicated follow-up is indicated. Additional sub-1 cm hypodensities are too small to characterize though likely additional cysts. Kidneys are otherwise normal, without renal calculi or hydronephrosis. Bladder is unremarkable. Stomach/Bowel: Stomach is within normal limits. Appendix appears normal. Nonobstructed small bowel.  Nondilated colon. Similar appearance of drain seton at the anorectal verge. Decreased rectal mucosal thickening with mild residual perirectal stranding. LEFT-sided air and fluid tracking along the rectal wall, consistent with a persistent fistula-in-ano. See key image Vascular/Lymphatic: No significant vascular findings are present. No enlarged abdominal or pelvic lymph nodes. Reproductive: Prostate is unremarkable. Other: Small fat-containing RIGHT inguinal hernia versus cord lipoma. No abdominopelvic ascites. Musculoskeletal: No acute or significant osseous findings. IMPRESSION: Since CT AP dated 04/03/2023; 1. Decreased rectal mucosal thickening with mild residual peri-rectal stranding. 2. Small, LEFT-sided persistent anorectal fistula.  See key image. No focal drainable collection or abscess. Electronically Signed   By: Roanna Banning M.D.   On: 08/22/2023 12:51   I have personally spent 52 minutes involved in face-to-face and non-face-to-face activities for this patient on the day of the visit. Professional time spent includes the following activities: Preparing to see the patient (review of tests), Obtaining and/or reviewing separately obtained history (admission/discharge record),  Performing a medically appropriate examination and/or evaluation , Ordering medications/tests/procedures, referring and communicating with other health care professionals, Documenting clinical information in the EMR, Independently interpreting results (not separately reported), Communicating results to the patient/family/caregiver, Counseling and educating the patient/family/caregiver and Care coordination (not separately reported).   Plan d/w requesting provider as well as ID pharm D  Of note, portions of this note may have been created with voice recognition software. While this note has been edited for accuracy, occasional wrong-word or 'sound-a-like' substitutions may have occurred due to the inherent limitations of voice  recognition software.   Electronically signed by:   Odette Fraction, MD Infectious Disease Physician [facility] Infectious Disease Pager: [phone]

## 2023-09-22 ENCOUNTER — Ambulatory Visit (HOSPITAL_BASED_OUTPATIENT_CLINIC_OR_DEPARTMENT_OTHER): Payer: Medicaid Other | Admitting: Family Medicine

## 2023-09-22 ENCOUNTER — Other Ambulatory Visit (HOSPITAL_COMMUNITY): Payer: Self-pay

## 2023-09-22 ENCOUNTER — Encounter (HOSPITAL_BASED_OUTPATIENT_CLINIC_OR_DEPARTMENT_OTHER): Payer: Self-pay

## 2023-09-22 DIAGNOSIS — K6819 Other retroperitoneal abscess: Secondary | ICD-10-CM | POA: Diagnosis not present

## 2023-09-22 DIAGNOSIS — M4628 Osteomyelitis of vertebra, sacral and sacrococcygeal region: Secondary | ICD-10-CM | POA: Diagnosis not present

## 2023-09-22 DIAGNOSIS — E538 Deficiency of other specified B group vitamins: Secondary | ICD-10-CM | POA: Diagnosis not present

## 2023-09-22 DIAGNOSIS — K6289 Other specified diseases of anus and rectum: Secondary | ICD-10-CM | POA: Diagnosis not present

## 2023-09-22 DIAGNOSIS — D649 Anemia, unspecified: Secondary | ICD-10-CM | POA: Diagnosis not present

## 2023-09-22 DIAGNOSIS — K604 Rectal fistula, unspecified: Secondary | ICD-10-CM | POA: Diagnosis not present

## 2023-09-22 DIAGNOSIS — K50113 Crohn's disease of large intestine with fistula: Secondary | ICD-10-CM | POA: Diagnosis not present

## 2023-09-22 LAB — CBC
HCT: 24.7 % — ABNORMAL LOW (ref 39.0–52.0)
Hemoglobin: 8.2 g/dL — ABNORMAL LOW (ref 13.0–17.0)
MCH: 28.3 pg (ref 26.0–34.0)
MCHC: 33.2 g/dL (ref 30.0–36.0)
MCV: 85.2 fL (ref 80.0–100.0)
Platelets: 558 10*3/uL — ABNORMAL HIGH (ref 150–400)
RBC: 2.9 MIL/uL — ABNORMAL LOW (ref 4.22–5.81)
RDW: 17.2 % — ABNORMAL HIGH (ref 11.5–15.5)
WBC: 11.9 10*3/uL — ABNORMAL HIGH (ref 4.0–10.5)
nRBC: 0 % (ref 0.0–0.2)

## 2023-09-22 LAB — IRON AND TIBC
Iron: 19 ug/dL — ABNORMAL LOW (ref 45–182)
Saturation Ratios: 13 % — ABNORMAL LOW (ref 17.9–39.5)
TIBC: 148 ug/dL — ABNORMAL LOW (ref 250–450)
UIBC: 129 ug/dL

## 2023-09-22 LAB — BASIC METABOLIC PANEL
Anion gap: 12 (ref 5–15)
BUN: 16 mg/dL (ref 6–20)
CO2: 20 mmol/L — ABNORMAL LOW (ref 22–32)
Calcium: 8.5 mg/dL — ABNORMAL LOW (ref 8.9–10.3)
Chloride: 105 mmol/L (ref 98–111)
Creatinine, Ser: 1.8 mg/dL — ABNORMAL HIGH (ref 0.61–1.24)
GFR, Estimated: 48 mL/min — ABNORMAL LOW (ref >60)
Glucose, Bld: 111 mg/dL — ABNORMAL HIGH (ref 70–99)
Potassium: 4.7 mmol/L (ref 3.5–5.1)
Sodium: 137 mmol/L (ref 135–145)

## 2023-09-22 LAB — FOLATE: Folate: 14.5 ng/mL (ref >5.9)

## 2023-09-22 LAB — SEDIMENTATION RATE: Sed Rate: 118 mm/h — ABNORMAL HIGH (ref 0–16)

## 2023-09-22 LAB — HEPATITIS B SURFACE ANTIBODY,QUALITATIVE: Hep B S Ab: REACTIVE — AB

## 2023-09-22 LAB — FERRITIN: Ferritin: 530 ng/mL — ABNORMAL HIGH (ref 24–336)

## 2023-09-22 LAB — C-REACTIVE PROTEIN: CRP: 19.7 mg/dL — ABNORMAL HIGH (ref <1.0)

## 2023-09-22 LAB — VITAMIN B12: Vitamin B-12: 98 pg/mL — ABNORMAL LOW (ref 180–914)

## 2023-09-22 MED ORDER — METRONIDAZOLE 500 MG PO TABS
500.0000 mg | ORAL_TABLET | Freq: Two times a day (BID) | ORAL | 0 refills | Status: DC
  Filled 2023-09-22: qty 68, 34d supply, fill #0

## 2023-09-22 MED ORDER — DAPTOMYCIN IV (FOR PTA / DISCHARGE USE ONLY): 700.0000 mg | INTRAVENOUS | 0 refills | Status: AC

## 2023-09-22 MED ORDER — CEFTRIAXONE IV (FOR PTA / DISCHARGE USE ONLY): 2.0000 g | INTRAVENOUS | 0 refills | Status: AC

## 2023-09-22 MED ORDER — HYDROCORTISONE ACETATE 25 MG RE SUPP
25.0000 mg | Freq: Once | RECTAL | Status: DC
  Filled 2023-09-22: qty 1

## 2023-09-22 MED ORDER — HYDROCORTISONE ACETATE 25 MG RE SUPP
25.0000 mg | Freq: Every day | RECTAL | Status: DC
  Administered 2023-09-22: 25 mg via RECTAL
  Filled 2023-09-22: qty 1

## 2023-09-22 MED ORDER — CYANOCOBALAMIN 1000 MCG/ML IJ SOLN
1000.0000 ug | INTRAMUSCULAR | Status: DC
  Administered 2023-09-22: 1000 ug via INTRAMUSCULAR
  Filled 2023-09-22: qty 1

## 2023-09-22 NOTE — TOC Progression Note (Addendum)
Transition of Care (TOC) - Progression Note   Received a message patient will discharge home with IV ABX.   Discussed with patient. Patient has done IV ABX at home before. Explained [employer] is the infusion company . Pam from [place] will provide patient education prior to discharge. [employer] will provide home health RN but [facility] will not be present every time a dose is due. Patient voiced understanding   [employer] asking if MD can sign OPAT today ( ID agreed) and patient has Medicaid [employer] so will need to get attending to sign PA and DWO forms signed  Patient Details  Name: Shawn [name] MRN: [id number] Date of Birth: Apr 22, 1986  Transition of Care [facility]) CM/SW Contact  Donja Tipping, Adria Devon, RN Phone Number: 09/22/2023, 3:30 PM  Clinical Narrative:       Expected Discharge Plan:  (await treatment plan) Barriers to Discharge: Continued Medical Work up  Expected Discharge Plan and Services   Discharge Planning Services: CM Consult   Living arrangements for the past 2 months: Single Family Home                                       Social Determinants of Health (SDOH) Interventions SDOH Screenings   Food Insecurity: No Food Insecurity (09/19/2023)  Housing: Low Risk  (09/19/2023)  Transportation Needs: No Transportation Needs (09/19/2023)  Utilities: Not At Risk (09/19/2023)  Alcohol Screen: Low Risk  (09/18/2023)  Depression (PHQ2-9): Medium Risk (01/05/2023)  Financial Resource Strain: Low Risk  (09/18/2023)  Physical Activity: Sufficiently Active (09/18/2023)  Social Connections: Socially Isolated (09/18/2023)  Stress: Stress Concern Present (09/18/2023)  Tobacco Use: Medium Risk (09/18/2023)    Readmission Risk Interventions     No data to display

## 2023-09-22 NOTE — Progress Notes (Signed)
Subjective: CC: Reports ongoing pain in his bottom. Tolerating diet. BM yesterday.   Objective: Vital signs in last 24 hours: Temp:  [98.4 F (36.9 C)-99.3 F (37.4 C)] 98.6 F (37 C) (01/30 0842) Pulse Rate:  [74-94] 76 (01/30 0842) Resp:  [17-20] 17 (01/30 0842) BP: (99-119)/(63-78) 119/70 (01/30 0842) SpO2:  [97 %-100 %] 97 % (01/30 0842) Last BM Date : 09/21/23  Intake/Output from previous day: 01/29 0701 - 01/30 0700 In: 890 [P.O.:790; IV Piggyback:100] Out: -  Intake/Output this shift: No intake/output data recorded.  PE: Gen:  Alert, NAD, pleasant GU: Chaperone, RN - Claudia, present for exam. Posterior midline seton in place with scant mostly serous, mixed purulent drainage. TTP over his sacrum without obvious fluctuance.   Lab Results:  Recent Labs    09/21/23 0719 09/22/23 0543  WBC 10.7* 11.9*  HGB 8.2* 8.2*  HCT 24.7* 24.7*  PLT 527* 558*   BMET Recent Labs    09/21/23 0719 09/22/23 0543  NA 134* 137  K 4.4 4.7  CL 101 105  CO2 20* 20*  GLUCOSE 128* 111*  BUN 17 16  CREATININE 1.73* 1.80*  CALCIUM 8.4* 8.5*   PT/INR No results for input(s): "LABPROT", "INR" in the last 72 hours. CMP     Component Value Date/Time   NA 137 09/22/2023 0543   NA 140 01/10/2023 0920   K 4.7 09/22/2023 0543   CL 105 09/22/2023 0543   CO2 20 (L) 09/22/2023 0543   GLUCOSE 111 (H) 09/22/2023 0543   BUN 16 09/22/2023 0543   BUN 16 01/10/2023 0920   CREATININE 1.80 (H) 09/22/2023 0543   CALCIUM 8.5 (L) 09/22/2023 0543   PROT 8.4 (H) 04/03/2023 1950   PROT 6.9 01/10/2023 0920   ALBUMIN 3.8 04/03/2023 1950   ALBUMIN 3.9 (L) 01/10/2023 0920   AST 21 04/03/2023 1950   ALT 27 04/03/2023 1950   ALKPHOS 123 04/03/2023 1950   BILITOT 0.6 04/03/2023 1950   BILITOT 0.6 01/10/2023 0920   GFRNONAA 48 (L) 09/22/2023 0543   GFRAA >60 01/08/2020 1830   Lipase     Component Value Date/Time   LIPASE 35 06/04/2020 1148    Studies/Results: CT guided needle  placement Result Date: 09/20/2023 INDICATION: 38 year old male with history of perianal abscess. EXAM: CT-guided aspiration MEDICATIONS: None. ANESTHESIA/SEDATION: Moderate (conscious) sedation was employed during this procedure. A total of Versed 1 mg and Fentanyl 75 mcg was administered intravenously by the radiology nurse. Total intra-service moderate Sedation Time: 6 minutes. The patient's level of consciousness and vital signs were monitored continuously by radiology nursing throughout the procedure under my direct supervision. COMPLICATIONS: None immediate. PROCEDURE: Informed written consent was obtained from the patient after a thorough discussion of the procedural risks, benefits and alternatives. All questions were addressed. Maximal Sterile Barrier Technique was utilized including caps, mask, sterile gowns, sterile gloves, sterile drape, hand hygiene and skin antiseptic. A timeout was performed prior to the initiation of the procedure. The patient was position prone on the CT scanner. Limited CT through the pelvis demonstrated similar appearing multiloculated small gas containing fluid collections in the region of the rectum. A small collection in the left gluteal musculature was targeted. Subdermal Local anesthesia was provided at the planned needle entry site with 1% lidocaine. Deeper local anesthetic was administered under CT guidance to the periphery of the fluid collection. Small skin nick was made. Under intermittent CT guidance, an 18 gauge trocar needle was directed to the  central aspect of the small fluid collection. Aspiration was performed which yielded approximately 3 mL of prelim fluid. The needle was removed. A sterile bandage was applied. The patient tolerated the procedure well was transferred back to the floor in good condition. IMPRESSION: Technically successful CT-guided left gluteal abscess aspiration. Aspirate was sent to the lab for culture. Marliss Coots, MD [facility] Electronically Signed   By: Marliss Coots M.D.   On: 09/20/2023 21:07    Anti-infectives: Anti-infectives (From admission, onward)    Start     Dose/Rate Route Frequency Ordered Stop   09/20/23 2200  metroNIDAZOLE (FLAGYL) tablet 500 mg        500 mg Oral Every 12 hours 09/20/23 1600     09/20/23 1700  DAPTOmycin (CUBICIN) IVPB 700 mg/168mL premix        8 mg/kg  93 kg 200 mL/hr over 30 Minutes Intravenous Daily 09/20/23 1600     09/20/23 1700  cefTRIAXone (ROCEPHIN) 2 g in sodium chloride 0.9 % 100 mL IVPB        2 g 200 mL/hr over 30 Minutes Intravenous Every 24 hours 09/20/23 1600     09/19/23 1130  piperacillin-tazobactam (ZOSYN) IVPB 3.375 g  Status:  Discontinued        3.375 g 12.5 mL/hr over 240 Minutes Intravenous Every 8 hours 09/19/23 1103 09/19/23 1331   09/19/23 0745  ceFEPIme (MAXIPIME) 2 g in sodium chloride 0.9 % 100 mL IVPB  Status:  Discontinued        2 g 200 mL/hr over 30 Minutes Intravenous  Once 09/19/23 0743 09/19/23 0920   09/19/23 0745  vancomycin (VANCOCIN) IVPB 1000 mg/200 mL premix  Status:  Discontinued       Placed in "And" Linked Group   1,000 mg 200 mL/hr over 60 Minutes Intravenous  Once 09/19/23 0743 09/19/23 0920   09/19/23 0745  vancomycin (VANCOCIN) IVPB 1000 mg/200 mL premix       Placed in "And" Linked Group   1,000 mg 200 mL/hr over 60 Minutes Intravenous  Once 09/19/23 0743 09/19/23 0928        Assessment/Plan Crohn's disease with recurrent perirectal / gluteal abscesses and anorectal involvement.   Concern for uncontrolled Crohn's proctitis that seems worsening with draining sinus tracts and small presacral collection.  Please see Dr. Michaell Cowing note from 1/29  Presacral collection not able for surgical drainage.  S/p IR aspiration of 3 mL.  No drain placed.  Follow cx. Abx per ID. Will also defer concern for osteomyelitis to ID.    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 Michaell Cowing removed 2 years ago.   GI following and arranging f/u to get patient restarting on biologics as an outpatient. They do not recommend steroids at this time.    No indication for surgical intervention at this time. We will sign off. Please call back with questions or concerns. Will clarify timing of f/u with [place] in the office.    I reviewed nursing notes, last 24 h vitals and pain scores, last 48 h intake and output, last 24 h labs and trends, and last 24 h imaging results.   LOS: 3 days    Jacinto Halim , [facility] 09/22/2023, 10:10 AM Please see Amion for pager number during day hours 7:00am-4:30pm

## 2023-09-22 NOTE — Progress Notes (Addendum)
HD#3 Subjective:  Overnight Events: No acute event overnight. Had a large BM, he continues to pass gas.   Patient examined at bedside. He has rectal pain and tenesmus, somewhat limiting bowel movement. He  had questions about low Hb on labs, as he is fatigued with exertional SOB. Denies hematemesis or hematochezia.  Objective:  Vital signs in last 24 hours: Vitals:   09/21/23 1653 09/21/23 2105 09/22/23 0451 09/22/23 0842  BP: 104/66 103/63 99/68 119/70  Pulse: 74 79 94 76  Resp: 18 20 17 17   Temp: 98.5 F (36.9 C) 98.7 F (37.1 C) 99.3 F (37.4 C) 98.6 F (37 C)  TempSrc: Oral Oral Oral Oral  SpO2: 100% 100% 100% 97%  Weight:      Height:       Supplemental O2: Room Air SpO2: 97 % O2 Flow Rate (L/min): 2 L/min   Physical Exam:   General: NAD. CV: RRR. No m/r/g. No LE edema Pulmonary: Lungs CTAB. Normal effort. No wheezing or rales. Abdominal: Soft, nontender, nondistended. Normal bowel sounds. GU:  Gluteal cleft without drainage with dressing in place.   Filed Weights   09/18/23 1658  Weight: 93 kg     Intake/Output Summary (Last 24 hours) at 09/22/2023 1010 Last data filed at 09/21/2023 2120 Gross per 24 hour  Intake 640 ml  Output --  Net 640 ml   Net IO Since Admission: 1,298.06 mL [09/22/23 1010]  No results for input(s): "GLUCAP" in the last 72 hours.   Pertinent Labs:    Latest Ref Rng & Units 09/22/2023    5:43 AM 09/21/2023    7:19 AM 09/20/2023    6:20 AM  CBC  WBC 4.0 - 10.5 K/uL 11.9  10.7  15.1   Hemoglobin 13.0 - 17.0 g/dL 8.2  8.2  8.0   Hematocrit 39.0 - 52.0 % 24.7  24.7  24.1   Platelets 150 - 400 K/uL 558  527  536        Latest Ref Rng & Units 09/22/2023    5:43 AM 09/21/2023    7:19 AM 09/20/2023    6:20 AM  CMP  Glucose 70 - 99 mg/dL 161  096  99   BUN 6 - 20 mg/dL 16  17  15    Creatinine 0.61 - 1.24 mg/dL 0.45  4.09  8.11   Sodium 135 - 145 mmol/L 137  134  135   Potassium 3.5 - 5.1 mmol/L 4.7  4.4  4.8   Chloride 98 -  111 mmol/L 105  101  104   CO2 22 - 32 mmol/L 20  20  22    Calcium 8.9 - 10.3 mg/dL 8.5  8.4  8.4     Imaging: No results found.   Assessment/Plan:   Principal Problem:   Crohn's proctitis, with fistula (HCC) Active Problems:   Rectal pain   Crohn's disease of small intestine (HCC)   Pelvic abscess in male [facility])   High anal fistula   CKD (chronic kidney disease) stage 3, GFR 30-59 ml/min (HCC)   Osteomyelitis of sacrum (HCC)   Acute on chronic anemia   Patient Summary: Shawn [name] is a 38 y.o. M with PMH Crohn's colitis with anorectal fistula, prior coccyx osteomyelitis, macrocytic anemia, CKD stage 3a, who presented with 1 month of low back pain admitted for sacral abscess and sacral osteomyelitis.  # Osteomyelitis of sacrum # Coccyx osteomyelitis # Crohn's disease with anorectal fistula. # Chron's proctitis  Pt is having  tenesmus this morning,  had a large bowel movement yesterday.  Suspect tenesmus secondary to Crohn's disease. Presacral collection not able for surgical drainage.  S/p IR aspiration of 3 mL.  No drain placed.  He is afebrile, leukocytosis improving 10.1>>8.1, culture of the pelvic abscess NGTD at 48 hours. For rectal pain and tenesmus, GI recommending hydrocortisone suppository 25 mg PR at bedtime, he can start one this afternoon today. Pharmacy can help coat the suppository with lidocaine gel if insertion is painful. ID following, will continue broad coverage with Dap/ctx/metro. GI following, HbsAg negative, will follow up on TB and the other hepatitis labs.   - GI and Infectious disease following, appreciate recs. - Cw Dap/ceftriaxone/metronidazole. - Follow-up with Cx from the abscess  - Repeat CT/MRI 7-10 for re-evaluation of fluid collection. - Hydrocortisone suppository 25 mg - Scheduled MiraLAX BID.  - Scheduled Tylenol, and Oxy 5 mg as needed   # Acute on chronic normocytic anemia. # History of vit B12 deficiency. Pt endorses on going  fatigue and exertional SOB. Hb 8.2, MCV 85.  Iron labs consistent with anemia of chronic disease ( low iron and elevated ferritin).  Low vitamin B12 despite been on oral B12 supplementation, I suspect 2/2 to problems with absorption issues due to chron's disease. Will consider vitamin B12 shots.  - CBC - Vitamin B12 shots. - Transfuse if Hb< 7  # Elevated Scr on  CKD 3A   SCr 1.60>> 1.73>> 1.80.  Baseline Scr 1.5-1.7. Would encourage good oral hydration  - BMP - Avoid nephrotoxic drugs  Diet: NPO. IVF:  None VTE: heparin injection 5,000 Units Start: 09/20/23 2200 Code: Full PT/OT: Pending ID:  Anti-infectives (From admission, onward)    Start     Dose/Rate Route Frequency Ordered Stop   09/20/23 2200  metroNIDAZOLE (FLAGYL) tablet 500 mg        500 mg Oral Every 12 hours 09/20/23 1600     09/20/23 1700  DAPTOmycin (CUBICIN) IVPB 700 mg/129mL premix        8 mg/kg  93 kg 200 mL/hr over 30 Minutes Intravenous Daily 09/20/23 1600     09/20/23 1700  cefTRIAXone (ROCEPHIN) 2 g in sodium chloride 0.9 % 100 mL IVPB        2 g 200 mL/hr over 30 Minutes Intravenous Every 24 hours 09/20/23 1600     09/19/23 1130  piperacillin-tazobactam (ZOSYN) IVPB 3.375 g  Status:  Discontinued        3.375 g 12.5 mL/hr over 240 Minutes Intravenous Every 8 hours 09/19/23 1103 09/19/23 1331   09/19/23 0745  ceFEPIme (MAXIPIME) 2 g in sodium chloride 0.9 % 100 mL IVPB  Status:  Discontinued        2 g 200 mL/hr over 30 Minutes Intravenous  Once 09/19/23 0743 09/19/23 0920   09/19/23 0745  vancomycin (VANCOCIN) IVPB 1000 mg/200 mL premix  Status:  Discontinued       Placed in "And" Linked Group   1,000 mg 200 mL/hr over 60 Minutes Intravenous  Once 09/19/23 0743 09/19/23 0920   09/19/23 0745  vancomycin (VANCOCIN) IVPB 1000 mg/200 mL premix       Placed in "And" Linked Group   1,000 mg 200 mL/hr over 60 Minutes Intravenous  Once 09/19/23 0743 09/19/23 0928        Anticipated discharge to pending  medical stabilization.  Laretta Bolster, MD 09/22/2023, 10:10 AM Pager: [phone] [facility]  Please contact the on call pager after  5 pm and on weekends at [phone].

## 2023-09-22 NOTE — Progress Notes (Signed)
Referring Provider: No ref. provider found Primary Care Physician:  Hilbert Bible, FNP Primary Gastroenterologist:  Dr. Maren Beach  Reason for Consultation: Perianal fistulizing Crohn's disease complicated by presacral fluid collection and history of coccygeal osteomyelitis  INTERVAL HISTORY: - No acute events overnight - Reports having a large bowel movement 09/21/23 and complaining of "10 out of 10" rectal pain - Primary team reported symptoms of tenesmus today.  Discussed use of hydrocortisone suppository.  [name] states that he declined a suppository because he thought it was the type of suppository to entice defecation.  Educated him regarding the anti-inflammatory nature of hydrocortisone suppositories to alleviate rectal inflammation.  He is now amenable to trying this modality. - Antibiotic regimen-daptomycin/ceftriaxone/metronidazole starting 09/20/2023 - CRP trending up 14.3 --> 18.8 --> 19.7 - No specific organism obtained from abscess aspirate   INITIAL CONSULT 09/21/23: HPI: Shawn [name] is a 38 y.o. 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  In speaking with Shawn [name] as well as reviewing his prior medical record, his IBD history is noteworthy for the following:  05/2020-coccygeal pain, CT imaging demonstrating perirectal stranding 10/2020-right groin abscess ?  Diagnosis of hidradenitis suppurativa 11/2020-rectal pain, IDA and CT showing rectal wall stranding 12/2020-colonoscopy demonstrating rectal inflammation in the distal, mid and proximal rectum; remainder of colon normal-pathology consistent with chronic colitis with  focal activity in the rectum 03/2021 -EUA with incision and drainage of left perianal abscess; diagnosed with coccygeal osteomyelitis around this time 06/2021 -modified Hanley procedure and coccygectomy 07/2021 -completed antibiotic treatment with cefdinir metronidazole; induction infliximab 5 mg/kg with maintenance every 8 weeks 12/2021 - recurrent perianal fistula and abscess 03/2022 - therapeutic drug monitoring demonstrated nil level less than 0.4, high antibodies 1358 08/2022  - induction Stelara followed by maintenance 90 mg subcutaneously every 8 weeks 03/2023  -reports improved symptoms; colonoscopy showed diminished rectal inflammation now moderate in severity   During my interview today, Shawn [name] relates that tolerated Stelara injections well and felt that the medication was helping throughout spring and summer 2024.  He discontinued the medication of his own volition and a 2024 as he was under the impression that his Crohn's disease was in remission and he no longer required any treatment.  Reports that he does not typically have luminal GI symptoms-no abdominal pain, diarrhea.  Endorses constipation when he has perianal discomfort and pain.  Denies extraintestinal manifestations of IBD although his chart comments on arthritis.  No history of immune deficiency or other unusual infections States that he had staph cellulitis in the past -no pneumonia, sinus infections HIV testing negative  He is now admitted to the hospital with worsening perianal pain and CT/MR imaging demonstrating an intact seton with fistula at the left side of the rectum and diffuse wall thickening of the anorectal junction down to the anus.  In conjunction with this there is a 7.3 x 4.0 x 1.5 cm presacral fluid collection.  Notation of increased marrow T2 hyperintensity suspicious for early osteomyelitis.  He underwent CT-guided aspiration of left gluteal abscess with cultures pending. Currently on antibiotic  treatment with vancomycin, ceftriaxone and metronidazole Continues to experience rectal pain that is controlled with Tylenol and oxycodone.   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   PO B12 initiated 12/31/20   Anxiety    Arthritis    Chronic kidney disease    Crohn's colitis (HCC)    Dilated cardiomyopathy (HCC) 05/18/2013   By catheterization 2014    Fistula    GERD (gastroesophageal reflux disease)    Hypertension    Neuromuscular disorder (HCC)    Osteomyelitis (HCC)    tailbone    Past Surgical History:  Procedure Laterality Date   AV FISTULA REPAIR  07/07/2021   BIOPSY  01/01/2021   Procedure: BIOPSY;  Surgeon: Meryl Dare, MD;  Location: [facility] ENDOSCOPY;  Service: Endoscopy;;   COLONOSCOPY WITH PROPOFOL N/A 01/01/2021   Procedure: COLONOSCOPY WITH PROPOFOL;  Surgeon: Meryl Dare, MD;  Location: [facility] ENDOSCOPY;  Service: Endoscopy;  Laterality: N/A;   INCISION AND DRAINAGE ABSCESS N/A 07/07/2021   Procedure: MODIFIED HANLEY PROCEDURE, DRAINAGE WITH SETON PLACEMENT;  Surgeon: Karie Soda, MD;  Location: [facility] ORS;  Service: General;  Laterality: N/A;   LEFT AND RIGHT HEART CATHETERIZATION WITH CORONARY ANGIOGRAM N/A 11/21/2012   Procedure: LEFT AND RIGHT HEART CATHETERIZATION WITH CORONARY ANGIOGRAM;  Surgeon: Ricki Rodriguez, MD;  Location: [facility] CATH LAB;  Service: Cardiovascular;  Laterality: N/A;   MANDIBLE FRACTURE SURGERY  08/23/2004   PLACEMENT OF SETON N/A 07/07/2021   Procedure: PLACEMENT OF SETON;  Surgeon: Karie Soda, MD;  Location: [facility] ORS;  Service: General;  Laterality: N/A;   PLACEMENT OF SETON N/A 01/15/2022   Procedure: PLACEMENT OF SETON;  Surgeon: Karie Soda, MD;  Location: [facility] ORS;  Service: General;  Laterality: N/A;   RECTAL EXAM UNDER ANESTHESIA N/A 01/15/2022   Procedure: RECTAL EXAM UNDER ANESTHESIA;  Surgeon: Karie Soda, MD;  Location: [facility] ORS;  Service: General;  Laterality: N/A;  TRANSRECTAL DRAINAGE REPLACEMENT OF SETON EXCISION OF PERINEAL SINUS TRACT ANORECTAL EXAM UNDER ANESTHESIA    Prior to Admission medications   Medication Sig Start Date End Date Taking? Authorizing Provider  acetaminophen (TYLENOL) 500 MG tablet Take 2 tablets (1,000 mg total) by mouth every 8 (eight) hours as needed for moderate pain. 02/18/23  Yes Glyn Ade, MD  vitamin B-12 1000 MCG tablet Take 1 tablet (1,000 mcg total) by mouth daily. 07/10/21  Yes Karie Soda, MD  oxyCODONE-acetaminophen (PERCOCET/ROXICET) 5-325 MG tablet Take 1-2 tablets by mouth every 6 (six) hours as needed for severe pain (pain score 7-10). Patient not taking: Reported on 09/19/2023 08/22/23   Rolan Bucco, MD  ustekinumab (STELARA) 90 MG/ML SOSY injection Inject 1 mL (90 mg total) into the skin every 8 (eight) weeks. no further refills unless appt is kept Patient not taking: Reported on 09/19/2023 01/19/23   Meryl Dare, MD    Current Facility-Administered Medications  Medication Dose Route Frequency Provider Last Rate Last Admin   acetaminophen (TYLENOL) tablet 1,000 mg  1,000 mg Oral Trecia Rogers, MD   1,000 mg at 09/22/23 1304   alum & mag hydroxide-simeth (MAALOX/MYLANTA) 200-200-20 MG/5ML suspension 30 mL  30 mL Oral Q6H PRN Karie Soda, MD       cefTRIAXone (ROCEPHIN) 2 g in sodium chloride 0.9 % 100 mL IVPB  2 g Intravenous Q24H Blanchard Kelch, NP 200 mL/hr at 09/21/23 1638 2 g at 09/21/23 1638   cyanocobalamin (VITAMIN B12) injection 1,000 mcg  1,000 mcg Intramuscular Weekly Laretta Bolster, MD   1,000 mcg at 09/22/23 1304  DAPTOmycin (CUBICIN) IVPB 700 mg/114mL premix  8 mg/kg Intravenous Q1400 Blanchard Kelch, NP   Stopped at 09/22/23 1347   diazepam (VALIUM) tablet 5 mg  5 mg Oral Q8H PRN Karie Soda, MD       diphenhydrAMINE (BENADRYL) injection 12.5-25 mg  12.5-25 mg Intravenous Q6H PRN Karie Soda, MD        gadobutrol (GADAVIST) 1 MMOL/ML injection 9 mL  9 mL Intravenous Once PRN Dione Booze, MD       heparin injection 5,000 Units  5,000 Units Subcutaneous Q8H Ralene Muskrat, PA-C   5,000 Units at 09/22/23 1304   hydrocortisone (ANUSOL-HC) suppository 25 mg  25 mg Rectal Once Laretta Bolster, MD       HYDROmorphone (DILAUDID) injection 1 mg  1 mg Intravenous Q4H PRN Rana Snare, DO   1 mg at 09/22/23 1307   lidocaine-EPINEPHrine (XYLOCAINE W/EPI) 1 %-1:100000 (with pres) injection 20 mL  20 mL Intradermal Once Suttle, Thressa Sheller, MD       magic mouthwash  15 mL Oral QID PRN Karie Soda, MD       menthol-cetylpyridinium (CEPACOL) lozenge 3 mg  1 lozenge Oral PRN Karie Soda, MD       methocarbamol (ROBAXIN) tablet 1,000 mg  1,000 mg Oral Q6H PRN Karie Soda, MD   1,000 mg at 09/21/23 2333   metroNIDAZOLE (FLAGYL) tablet 500 mg  500 mg Oral Q12H Blanchard Kelch, NP   500 mg at 09/22/23 0818   naphazoline-glycerin (CLEAR EYES REDNESS) ophth solution 1-2 drop  1-2 drop Both Eyes QID PRN Karie Soda, MD       oxyCODONE (Oxy IR/ROXICODONE) immediate release tablet 5-10 mg  5-10 mg Oral Q4H PRN Karie Soda, MD   10 mg at 09/22/23 1143   phenol (CHLORASEPTIC) mouth spray 2 spray  2 spray Mouth/Throat PRN Karie Soda, MD       polycarbophil (FIBERCON) tablet 625 mg  625 mg Oral BID Karie Soda, MD   625 mg at 09/22/23 0818   polyethylene glycol (MIRALAX / GLYCOLAX) packet 17 g  17 g Oral BID Karie Soda, MD   17 g at 09/22/23 1610   simethicone (MYLICON) 40 MG/0.6ML suspension 80 mg  80 mg Oral QID PRN Karie Soda, MD       sodium chloride (OCEAN) 0.65 % nasal spray 1-2 spray  1-2 spray Each Nare Q6H PRN Karie Soda, MD       Facility-Administered Medications Ordered in Other Encounters  Medication Dose Route Frequency Provider Last Rate Last Admin   bupivacaine liposome (EXPAREL) 1.3 % injection 266 mg  20 mL Infiltration Once Karie Soda, MD       Chlorhexidine Gluconate  Cloth 2 % PADS 6 each  6 each Topical Once Karie Soda, MD       And   Chlorhexidine Gluconate Cloth 2 % PADS 6 each  6 each Topical Once Karie Soda, MD       feeding supplement (ENSURE PRE-SURGERY) liquid 296 mL  296 mL Oral Once Karie Soda, MD        Allergies as of 09/18/2023 - Review Complete 09/18/2023  Allergen Reaction Noted   Shrimp [shellfish allergy] Shortness Of Breath 05/16/2013   Nsaids Other (See Comments) 07/06/2021    Family History  Problem Relation Age of Onset   Diabetes Mother    Kidney failure Mother    Diabetes Sister    Colon cancer Neg Hx    Stomach cancer Neg Hx  Esophageal cancer Neg Hx    Pancreatic disease Neg Hx    Colon polyps Neg Hx    Rectal cancer Neg Hx     Social History   Socioeconomic History   Marital status: Single    Spouse name: Not on file   Number of children: Not on file   Years of education: Not on file   Highest education level: Associate degree: academic program  Occupational History   Not on file  Tobacco Use   Smoking status: Former    Current packs/day: 0.25    Types: Cigarettes   Smokeless tobacco: Never  Vaping Use   Vaping status: Never Used  Substance and Sexual Activity   Alcohol use: Not Currently    Comment: occ   Drug use: No   Sexual activity: Not on file  Other Topics Concern   Not on file  Social History Narrative   Not on file   Social Drivers of Health   Financial Resource Strain: Low Risk  (09/18/2023)   Overall Financial Resource Strain (CARDIA)    Difficulty of Paying Living Expenses: Not hard at all  Food Insecurity: No Food Insecurity (09/19/2023)   Hunger Vital Sign    Worried About Running Out of Food in the Last Year: Never true    Ran Out of Food in the Last Year: Never true  Transportation Needs: No Transportation Needs (09/19/2023)   PRAPARE - Administrator, Civil Service (Medical): No    Lack of Transportation (Non-Medical): No  Physical Activity: Sufficiently  Active (09/18/2023)   Exercise Vital Sign    Days of Exercise per Week: 7 days    Minutes of Exercise per Session: 30 min  Stress: Stress Concern Present (09/18/2023)   [employer] of Occupational Health - Occupational Stress Questionnaire    Feeling of Stress : Very much  Social Connections: Socially Isolated (09/18/2023)   Social Connection and Isolation Panel [NHANES]    Frequency of Communication with Friends and Family: Three times a week    Frequency of Social Gatherings with Friends and Family: Never    Attends Religious Services: Never    Database administrator or Organizations: No    Attends Engineer, structural: Not on file    Marital Status: Never married  Intimate Partner Violence: Not At Risk (09/19/2023)   Humiliation, Afraid, Rape, and Kick questionnaire    Fear of Current or Ex-Partner: No    Emotionally Abused: No    Physically Abused: No    Sexually Abused: No    Review of Systems: A targeted review of systems was performed-denies fevers, chills, abdominal pain, nausea, vomiting, weight loss  Physical Exam: Vital signs in last 24 hours: Temp:  [98.5 F (36.9 C)-99.3 F (37.4 C)] 98.6 F (37 C) (01/30 0842) Pulse Rate:  [74-94] 76 (01/30 0842) Resp:  [17-20] 17 (01/30 0842) BP: (99-119)/(63-70) 119/70 (01/30 0842) SpO2:  [97 %-100 %] 97 % (01/30 0842) Last BM Date : 09/21/23 General:   Alert,  Well-developed, well-nourished, pleasant and cooperative in NAD Head:  Normocephalic and atraumatic. Eyes:  Sclera clear, no icterus.   Conjunctiva pink. Abdomen:  Soft,nontender, BS active,nonpalp mass or hsm.   Rectal:Deferred today Msk:  Symmetrical without gross deformities. . Neurologic:  Alert and  oriented x4;  grossly normal neurologically. Skin:  Intact without significant lesions or rashes.. Psych:  Alert and cooperative. Normal mood and affect.  Intake/Output from previous day: 01/29 0701 - 01/30 0700 In:  890 [P.O.:790; IV  Piggyback:100] Out: -  Intake/Output this shift: Total I/O In: 680 [P.O.:480; IV Piggyback:200] Out: -   Lab Results: Recent Labs    09/20/23 0620 09/21/23 0719 09/22/23 0543  WBC 15.1* 10.7* 11.9*  HGB 8.0* 8.2* 8.2*  HCT 24.1* 24.7* 24.7*  PLT 536* 527* 558*   BMET Recent Labs    09/20/23 0620 09/21/23 0719 09/22/23 0543  NA 135 134* 137  K 4.8 4.4 4.7  CL 104 101 105  CO2 22 20* 20*  GLUCOSE 99 128* 111*  BUN 15 17 16   CREATININE 1.67* 1.73* 1.80*  CALCIUM 8.4* 8.4* 8.5*   LFT No results for input(s): "PROT", "ALBUMIN", "AST", "ALT", "ALKPHOS", "BILITOT", "BILIDIR", "IBILI" in the last 72 hours. PT/INR No results for input(s): "LABPROT", "INR" in the last 72 hours. Hepatitis Panel Recent Labs    09/20/23 0619  HEPBSAG NON REACTIVE    Studies/Results:  MRI pelvis 09/19/2023 IMPRESSION: 1. Enlarging presacral fluid collection with peripheral enhancement suspicious for an abscess. There is asymmetric extension of peripherally enhancing fluid into the posterior aspect of the left greater sciatic notch. 2. Increased marrow T2 hyperintensity and enhancement within the adjacent mid to distal sacrum and coccyx suspicious for early osteomyelitis. 3. Presacral inflammatory changes extend into the adjacent piriformis and gluteus maximus muscles bilaterally with associated small intramuscular fluid collections. 4. No evidence of sacroiliitis or hip joint septic arthritis. ADDENDUM: The original report was by Dr. Roxy Horseman. The following addendum is by Dr. Gaylyn Rong:   I received a message from Estill Cotta, radiology assistant, that further comment on the rectum and concern for proctitis in this patient with Crohn's disease was requested on this exam performed on 09/19/2023. Dr. Purcell Mouton was not available and so this was passed to me.   In this context I have reviewed the appearance of the rectum for comment below. However, I have not reinterpreted  the entire case or assessed other regions.   In addition to the perianal fistula with seton tube in place, there appears to be a fistula extending from the left side of the rectum (image [id number] series 13) at about the 3 o'clock position approximately 5 cm cephalad to the anorectal junction, and extending directly posteriorly into the complex presacral collection. The collection has small abscess like extensions extending along the gluteal musculature. Multiple likely reactive perirectal lymph nodes are present.   There is primarily lower rectal diffuse wall thickening extending down to the anorectal junction and probably into the anus. The upper rectum does not appear substantially thickened.   If follow up imaging of the perirectal and perianal fistulas becomes indicated, consider dedicated perianal fistula protocol MRI which has some advantages in depicting fistulas in this region compared to standard pelvic MRI.     No results found.  Endoscopic procedures Colonoscopy 04/06/2023 - Abnormal perianal exam fistula with seton in place.  - The examined portion of the ileum was normal. Biopsied. - Crohn's disease with colonic involvement. Inflammation was found from the anus to the rectum. This was moderate in severity, improved compared to previous examinations. Biopsied.  - The examination was otherwise normal on direct and retroflexion views.  Path 1. Surgical [P], small bowel, ileum ILEAL MUCOSA WITH PRESERVED VILLOGLANDULAR ARCHITECTURE WITHOUT INCREASED INTRAEPITHELIAL LYMPHOCYTES OR EVIDENCE OF ACTIVE INFLAMMATION. NEGATIVE FOR DYSPLASIA OR MALIGNANCY. 2. Surgical [P], random colon sites COLONIC MUCOSA WITH NO SIGNIFICANT DIAGNOSTIC ALTERATION. NO EVIDENCE OF LYMPHOCYTIC COLITIS OR COLLAGENOUS COLITIS. NO EVIDENCE OF ACTIVITY, CHRONICITY, GRANULOMA, DYSPLASIA  OR MALIGNANCY. 3. Surgical [P], colon, rectum COLONIC MUCOSA WITH REACTIVE EPITHELIAL CHANGES AND LYMPHOID  AGGREGATES. NEGATIVE FOR DYSPLASIA OR MALIGNANCY.  Colonoscopy 01/01/2021 - The examined portion of the ileum was normal.  - Congested, indurated, friable mucosa in the rectum. Biopsied. Path A. 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  He is admitted to the hospital with recurrent sacral abscesses and concern for possible osteomyelitis.  In terms of his IBD management, he was previously treated with infliximab 5 mg/kg every 8 weeks but unfortunately developed anti-TNF immunogenicity rendering the medication ineffective.  In January 2024 he started induction Stelara and was on maintenance 90 mg subcutaneously every 8 weeks.  He does not recall further dose optimization and no record of therapeutic drug monitoring.  He indicates having a beneficial response to the medication.  Restaging colonoscopy 03/2023 demonstrated moderate inflammation in the rectum improved from prior evaluation.  During this admission, we discussed the possibility of reinstating Stelara in the outpatient setting given his previous beneficial response once his pelvic sepsis is better controlled.  Since he has been off of the medication for several months he would require reinduction followed by subsequent maintenance dosing which may need to be dose optimized to every 4 weeks depending upon his response and therapeutic drug monitoring.  We reviewed that other options for medical management could also include Skyrizi or Rinvoq.   Were he to have a partial response to Stelara could consider dual therapy with Stelara + Rinvoq given his aggressive phenotype if it were approved by insurance.  In terms of his current clinical status, he is overall stable but unchanged.  Continuing to endorse anal rectal discomfort and reviewed with his primary team trial of hydrocortisone suppositories.  He initially declined them but after reeducating him regarding their anti-inflammatory properties rather than the type of suppositories used to encourage defecation he was amenable to trying.  Suppository would need to be well-lubricated and can consider coating with lidocaine jelly for topical effect with administration.  His labs indicate an increasing trend in CRP.  No specific organism has been identified from the abscess aspirate.  It will be important to ensure there is objective improvement in his abscesses on current antibiotic regimen with either a decrease in CRP or reimaging to confirm improvement in abscesses before commencing on immune suppression.  PLAN: Continue current antibiotic management per ID recommendations Monitor clinical course and consider repeat CT or MR imaging in 7 to 10 days for reevaluation of fluid collections.  If CRP continuing to uptrend or he develops more pain would recommend sooner reimaging depending upon his clinical status. Will begin outpatient process of insurance approval for Stelara induction infusion 520 mg IV x 1 followed by maintenance injections 90 mg subcutaneously every 8 weeks with further dose optimization Follow-up results of prebiologic labs-hepatitis B surface antigen, hepatitis B surface antibody, hepatitis B core antibody total and QuantiFERON gold Follow serial CRP values to assess trend. Would hold off on IV steroids at this time until pelvic sepsis is better controlled Trial of  hydrocortisone suppository 25 mg per rectum nightly/daily.  This will need to be well-lubricated and can consider  coating with lidocaine jelly for ease of administration given anorectal discomfort. Agree with anemia workup.  If consistent with iron deficiency anemia consider iron infusions and oral iron supplementation Shawn [name] is scheduled for follow-up in the gastroenterology clinic with me on 08/09/2024 and issues related to IBD health maintenance can be addressed at that time.  Thank you for this consult.  Please contact [facility] on-call for any additional questions.   Durene Romans Alexandria Shiflett  09/22/2023, 4:46 PM

## 2023-09-22 NOTE — Progress Notes (Signed)
PHARMACY CONSULT NOTE FOR:  OUTPATIENT  PARENTERAL ANTIBIOTIC THERAPY (OPAT)  Indication: Osteomyelitis Regimen: Ceftriaxone 2 g IV daily, Daptomycin 700 mg IV daily, and oral metronidazole 500 mg PO BID End date: 11/01/23  IV antibiotic discharge orders are pended. To discharging provider:  please sign these orders via discharge navigator,  Select New Orders & click on the button choice - Manage This Unsigned Work.     Thank you for allowing pharmacy to be a part of this patient's care.  Louie Casa Porter Moes 09/22/2023, 1:16 PM

## 2023-09-22 NOTE — Progress Notes (Addendum)
[facility] Infectious Diseases Follow Up Note  Patient Identification: Patient Name: Shawn [name] MRN: [id number] Admit Date: 09/18/2023  4:53 PM Age: 38 y.o.Today's Date: 09/22/2023  Reason for Visit: Pelvic abscess/osteomyelitis  Principal Problem:   Crohn's proctitis, with fistula (HCC) Active Problems:   Rectal pain   Crohn's disease of small intestine (HCC)   Pelvic abscess in male [facility])   High anal fistula   CKD (chronic kidney disease) stage 3, GFR 30-59 ml/min (HCC)   Osteomyelitis of sacrum (HCC)   Acute on chronic anemia   Antibiotics:  Vancomycin/Zosyn on 1/27, which was held for IR procedure 1/28 daptomycin/ceftriaxone/metronidazole started post IR procedure-  Lines/Hardwares:   Interval Events: He remains afebrile.  Labs with creatinine 1.8, WBC 11.9   Assessment 64 Y O male with h/o crohn's disease complicated with perirectal abscess anorectal fistula and prior complicated h/o coccygeal osteomyelitis s/p 6 weeks of IV ceftriaxone and metronidazole, EOT 02/11/21,  s/p  I and D of perirectal abscess in 03/2021, modified hanley procedure with drainage + seton placement  in 07/07/2021 ( OR Cx E. coli resistant to ampicillin and Unasyn, E faecalis) s/p approx 4 weeks of abtx with cefdinir and metronidazole completed 08/05/21 followed by excision of perineal tract and seton replacement in 01/15/22 admitted with worsening back pain.   # MRI concerning for presacral abscess with sacrococcygeal osteomyelitis and associated intramuscular abscesses - seen by Dr Michaell Cowing surgery, reviewed his notes from 1/27.  He is concerned about recurrent Crohn's disease causing recurrent fistula problems and recommended GI in house consultation - 1/20 seen by GI, plan to start Stelara for crohn's colitis once infection better controlled in repeat scan and  1/28 CT-guided left gluteal abscess aspiration. Cx sent   # Crohn's colitis  complicated with persistent anorectal fistula- not on biologics or steroids and has not followed with GI OP after last colonoscopy in August 2024 by Dr Russella Dar. GI has been re-engaged  Recommendations -Continue daptomycin, ceftriaxone and metronidazole, EOT 11/01/23 -I recommend prolonged IV abtx in the setting of complicated infection with coccygeal osteomyelitis with plan to start on biologics in next few weeks. Defer PO abtx options currently due to urgent need to better control infection.  -OK to start treatment of crohn's colitis as necessary in couple of weeks of antibiotics once better control of infection in repeat scan and improving inflammatory markers - Monitor CBC, CMP  - Fu OR cultures to completion, otherwise ID will so - Fu with GI and surgery for comprehensive care.   OPAT  Diagnosis: pelvic abscess, coccygeal osteo  Culture Result: negative to date  Allergies  Allergen Reactions   Shrimp [Shellfish Allergy] Shortness Of Breath   Nsaids Other (See Comments)    CROHN'S DISEASE = NO NSAIDS    OPAT Orders Discharge antibiotics to be given via PICC line Discharge antibiotics: daptomycin, ceftriaxone, PO metronidazole Per pharmacy protocol  Duration: 6 weeks  End Date: 11/01/23  [facility] Care Per Protocol:  Home health RN for IV administration and teaching; PICC line care and labs.    Labs weekly while on IV antibiotics: X__ CBC with differential __ BMP X__ CMP __ CRP __ ESR __ Vancomycin trough X__ CK  __ Please pull PIC at completion of IV antibiotics X__ Please leave PIC in place until doctor has seen patient or been notified  Fax weekly labs to [phone]  Clinic Follow Up Appt: 2/12 at 3: 30 pm   Rest of the management as per the primary team. Thank  you for the consult. Please page with pertinent questions or concerns.  ______________________________________________________________________ Subjective patient seen and examined at the bedside. No  complaints. Had BM yesterday, mild tenesmus. Discussed plan for PICC and IV abtx. He reports he has done PICC line back on 2022 and is aware about the process.   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   PO B12 initiated 12/31/20   Anxiety    Arthritis    Chronic kidney disease    Crohn's colitis (HCC)    Dilated cardiomyopathy (HCC) 05/18/2013   By catheterization 2014    Fistula    GERD (gastroesophageal reflux disease)    Hypertension    Neuromuscular disorder (HCC)    Osteomyelitis (HCC)    tailbone   Past Surgical History:  Procedure Laterality Date   AV FISTULA REPAIR  07/07/2021   BIOPSY  01/01/2021   Procedure: BIOPSY;  Surgeon: Meryl Dare, MD;  Location: [facility] ENDOSCOPY;  Service: Endoscopy;;   COLONOSCOPY WITH PROPOFOL N/A 01/01/2021   Procedure: COLONOSCOPY WITH PROPOFOL;  Surgeon: Meryl Dare, MD;  Location: [facility] ENDOSCOPY;  Service: Endoscopy;  Laterality: N/A;   INCISION AND DRAINAGE ABSCESS N/A 07/07/2021   Procedure: MODIFIED HANLEY PROCEDURE, DRAINAGE WITH SETON PLACEMENT;  Surgeon: Karie Soda, MD;  Location: [facility] ORS;  Service: General;  Laterality: N/A;   LEFT AND RIGHT HEART CATHETERIZATION WITH CORONARY ANGIOGRAM N/A 11/21/2012   Procedure: LEFT AND RIGHT HEART CATHETERIZATION WITH CORONARY ANGIOGRAM;  Surgeon: Ricki Rodriguez, MD;  Location: [facility] CATH LAB;  Service: Cardiovascular;  Laterality: N/A;   MANDIBLE FRACTURE SURGERY  08/23/2004   PLACEMENT OF SETON N/A 07/07/2021   Procedure: PLACEMENT OF SETON;  Surgeon: Karie Soda, MD;  Location: [facility] ORS;  Service: General;  Laterality: N/A;   PLACEMENT OF SETON N/A 01/15/2022   Procedure: PLACEMENT OF SETON;  Surgeon: Karie Soda, MD;  Location: [facility] ORS;  Service: General;  Laterality: N/A;   RECTAL EXAM UNDER ANESTHESIA N/A 01/15/2022   Procedure: RECTAL EXAM UNDER ANESTHESIA;   Surgeon: Karie Soda, MD;  Location: [facility] ORS;  Service: General;  Laterality: N/A;  TRANSRECTAL DRAINAGE REPLACEMENT OF SETON EXCISION OF PERINEAL SINUS TRACT ANORECTAL EXAM UNDER ANESTHESIA   Vitals BP 99/68 (BP Location: Left Arm)   Pulse 94   Temp 99.3 F (37.4 C) (Oral)   Resp 17   Ht 6\' 1"  (1.854 m)   Wt 93 kg   SpO2 100%   BMI 27.05 kg/m     Physical Exam Constitutional:  adult male lying in the bed, not in acute distress    Comments: HEENT wnl  Cardiovascular:     Rate and Rhythm: Normal rate and regular rhythm.     Heart sounds  Pulmonary:     Effort: Pulmonary effort is normal.     Comments:   Abdominal:     Palpations:    Tenderness: non distended  Musculoskeletal:        General: No swelling or tenderness in peripheral joints  Skin:    Comments: no rashes   GU not examined today  Neurological:     General: awake, alert and oriented, follows commands   Psychiatric:        Mood and Affect: Mood normal.   Pertinent Microbiology Results for orders placed or performed during the hospital encounter of 09/18/23  Culture, blood (Routine X 2) w  Reflex to ID Panel     Status: None (Preliminary result)   Collection Time: 09/19/23  9:45 AM   Specimen: BLOOD RIGHT ARM  Result Value Ref Range Status   Specimen Description BLOOD RIGHT ARM  Final   Special Requests   Final    BOTTLES DRAWN AEROBIC AND ANAEROBIC Blood Culture results may not be optimal due to an inadequate volume of blood received in culture bottles   Culture   Final    NO GROWTH 2 DAYS Performed at [facility], [address] [place], [place] [address]    Report Status PENDING  Incomplete  Culture, blood (Routine X 2) w Reflex to ID Panel     Status: None (Preliminary result)   Collection Time: 09/19/23  9:55 AM   Specimen: BLOOD LEFT ARM  Result Value Ref Range Status   Specimen Description BLOOD LEFT ARM  Final   Special Requests   Final    BOTTLES DRAWN AEROBIC ONLY Blood  Culture results may not be optimal due to an inadequate volume of blood received in culture bottles   Culture   Final    NO GROWTH 2 DAYS Performed at [facility], [address], [place], [place] [address]    Report Status PENDING  Incomplete  Aerobic/Anaerobic Culture w Gram Stain (surgical/deep wound)     Status: None (Preliminary result)   Collection Time: 09/20/23  2:51 PM   Specimen: Pelvis; Abscess  Result Value Ref Range Status   Specimen Description PELVIS ABSCESS  Final   Special Requests NONE  Final   Gram Stain   Final    MODERATE WBC PRESENT,BOTH PMN AND MONONUCLEAR NO ORGANISMS SEEN    Culture   Final    NO GROWTH < 24 HOURS Performed at [facility], [address], [place], [place] [address]    Report Status PENDING  Incomplete   Pertinent Lab.    Latest Ref Rng & Units 09/22/2023    5:43 AM 09/21/2023    7:19 AM 09/20/2023    6:20 AM  CBC  WBC 4.0 - 10.5 K/uL 11.9  10.7  15.1   Hemoglobin 13.0 - 17.0 g/dL 8.2  8.2  8.0   Hematocrit 39.0 - 52.0 % 24.7  24.7  24.1   Platelets 150 - 400 K/uL 558  527  536       Latest Ref Rng & Units 09/22/2023    5:43 AM 09/21/2023    7:19 AM 09/20/2023    6:20 AM  CMP  Glucose 70 - 99 mg/dL 914  782  99   BUN 6 - 20 mg/dL 16  17  15    Creatinine 0.61 - 1.24 mg/dL 9.56  2.13  0.86   Sodium 135 - 145 mmol/L 137  134  135   Potassium 3.5 - 5.1 mmol/L 4.7  4.4  4.8   Chloride 98 - 111 mmol/L 105  101  104   CO2 22 - 32 mmol/L 20  20  22    Calcium 8.9 - 10.3 mg/dL 8.5  8.4  8.4      Pertinent Imaging today Plain films and CT images have been personally visualized and interpreted; radiology reports have been reviewed. Decision making incorporated into the Impression /   MR PELVIS W WO CONTRAST Addendum Date: 09/21/2023 ADDENDUM REPORT: 09/21/2023 08:55 ADDENDUM: The original report was by Dr. Roxy Horseman. The following addendum is by Dr. Gaylyn Rong: I received a message from  Estill Cotta, radiology  assistant, that further comment on the rectum and concern for proctitis in this patient with Crohn's disease was requested on this exam performed on 09/19/2023. Dr. Purcell Mouton was not available and so this was passed to me. In this context I have reviewed the appearance of the rectum for comment below. However, I have not reinterpreted the entire case or assessed other regions. In addition to the perianal fistula with seton tube in place, there appears to be a fistula extending from the left side of the rectum (image 36-40 series 13) at about the 3 o'clock position approximately 5 cm cephalad to the anorectal junction, and extending directly posteriorly into the complex presacral collection. The collection has small abscess like extensions extending along the gluteal musculature. Multiple likely reactive perirectal lymph nodes are present. There is primarily lower rectal diffuse wall thickening extending down to the anorectal junction and probably into the anus. The upper rectum does not appear substantially thickened. If follow up imaging of the perirectal and perianal fistulas becomes indicated, consider dedicated perianal fistula protocol MRI which has some advantages in depicting fistulas in this region compared to standard pelvic MRI. Electronically Signed   By: Gaylyn Rong M.D.   On: 09/21/2023 08:55   Result Date: 09/21/2023 CLINICAL DATA:  Low back pain. History of perianal abscess/fistula. Clinical concern for osteomyelitis. EXAM: MRI PELVIS WITHOUT AND WITH CONTRAST TECHNIQUE: Multiplanar multisequence MR imaging of the pelvis was performed both before and after administration of intravenous contrast. CONTRAST:  9mL GADAVIST GADOBUTROL 1 MMOL/ML IV SOLN COMPARISON:  MRI pelvis 05/16/2021, CT pelvis 08/22/2023 and lumbar MRI 09/19/2023. FINDINGS: Bones/Joint/Cartilage There are chronic perirectal and presacral inflammatory changes which have progressed from previous MRI. Recent CT demonstrated a  perianal fistula with seton tube in place. A presacral fluid collection with a small amount of internal air and heterogeneous surrounding enhancement appears enlarged from the most recent CT, measuring up to 7.3 x 4.0 x 1.5 cm. Appearance is suspicious for a presacral abscess. There is increased marrow T2 hyperintensity and enhancement within the adjacent mid to distal sacrum and coccyx suspicious for early osteomyelitis. No gross cortical destruction identified. The sacroiliac joints appear intact. No significant hip joint effusion or abnormal synovial enhancement. The remainder of the bony pelvis appears intact. Ligaments No significant ligamentous abnormalities are identified. Muscles and Tendons Presacral inflammatory changes extend into the adjacent piriformis and gluteus maximus muscles bilaterally. There are associated small intramuscular fluid collections with peripheral enhancement. The common hamstring and iliopsoas tendons appear unremarkable. Soft tissue As above, enlarging complex presacral fluid collection with peripheral enhancement suspicious for an abscess. There is asymmetric extension of peripherally enhancing fluid into the posterior aspect of the left greater sciatic notch, measuring 2.0 x 1.3 x 1.3 cm. Grossly stable underlying rectal wall thickening. IMPRESSION: 1. Enlarging presacral fluid collection with peripheral enhancement suspicious for an abscess. There is asymmetric extension of peripherally enhancing fluid into the posterior aspect of the left greater sciatic notch. 2. Increased marrow T2 hyperintensity and enhancement within the adjacent mid to distal sacrum and coccyx suspicious for early osteomyelitis. 3. Presacral inflammatory changes extend into the adjacent piriformis and gluteus maximus muscles bilaterally with associated small intramuscular fluid collections. 4. No evidence of sacroiliitis or hip joint septic arthritis. Electronically Signed: By: Carey Bullocks M.D. On:  09/19/2023 08:57   CT guided needle placement Result Date: 09/20/2023 INDICATION: 38 year old male with history of perianal abscess. EXAM: CT-guided aspiration MEDICATIONS: None. ANESTHESIA/SEDATION: Moderate (conscious) sedation was employed  during this procedure. A total of Versed 1 mg and Fentanyl 75 mcg was administered intravenously by the radiology nurse. Total intra-service moderate Sedation Time: 6 minutes. The patient's level of consciousness and vital signs were monitored continuously by radiology nursing throughout the procedure under my direct supervision. COMPLICATIONS: None immediate. PROCEDURE: Informed written consent was obtained from the patient after a thorough discussion of the procedural risks, benefits and alternatives. All questions were addressed. Maximal Sterile Barrier Technique was utilized including caps, mask, sterile gowns, sterile gloves, sterile drape, hand hygiene and skin antiseptic. A timeout was performed prior to the initiation of the procedure. The patient was position prone on the CT scanner. Limited CT through the pelvis demonstrated similar appearing multiloculated small gas containing fluid collections in the region of the rectum. A small collection in the left gluteal musculature was targeted. Subdermal Local anesthesia was provided at the planned needle entry site with 1% lidocaine. Deeper local anesthetic was administered under CT guidance to the periphery of the fluid collection. Small skin nick was made. Under intermittent CT guidance, an 18 gauge trocar needle was directed to the central aspect of the small fluid collection. Aspiration was performed which yielded approximately 3 mL of prelim fluid. The needle was removed. A sterile bandage was applied. The patient tolerated the procedure well was transferred back to the floor in good condition. IMPRESSION: Technically successful CT-guided left gluteal abscess aspiration. Aspirate was sent to the lab for culture.  Marliss Coots, MD [facility] Electronically Signed   By: Marliss Coots M.D.   On: 09/20/2023 21:07   CT PELVIS W CONTRAST Result Date: 09/19/2023 CLINICAL DATA:  R/O Pelvic abscess assess pre sacral collection for possible IR drainage EXAM: CT PELVIS WITH CONTRAST TECHNIQUE: Multidetector CT imaging of the pelvis was performed using the standard protocol following the bolus administration of intravenous contrast. RADIATION DOSE REDUCTION: This exam was performed according to the departmental dose-optimization program which includes automated exposure control, adjustment of the mA and/or kV according to patient size and/or use of iterative reconstruction technique. CONTRAST:  75mL OMNIPAQUE IOHEXOL 350 MG/ML SOLN COMPARISON:  MR pelvis, 09/19/2023.  CT AP, 08/22/2023. FINDINGS: Urinary Tract:  Minimal urinary bladder distention. Bowel:  Imaged portions of small bowel and colon are nondilated. Vascular/Lymphatic: No pathologically enlarged lymph nodes. No significant vascular abnormality seen. Reproductive:  Prostate is within normal limits. Other: Stable positioning of drain seton at the anorectal verge. Increased peri-rectal stranding with air and fluid tracking along the LEFT rectal wall, consistent with a persistent fistula-in-ano. See key image. Small intramuscular rim-enhancing air-and fluid collections within the medial and deep margins of the gluteus maximus, largest measuring up to 1.5 cm RIGHT and 3.0 cm LEFT. Additional rim-enhancing air-fluid containing collection along the sacrum, measuring up to 2.0 x 4.0 x 6.0 cm (AP by transaxial by CC). No discrete underlying focal demineralization, erosion or periosteal change at the sacrum or coccyx. Musculoskeletal: Small fat-containing RIGHT inguinal hernia versus cord lipoma. No acute osseous abnormality IMPRESSION: 1. LEFT-sided persistent anorectal fistula with small intramuscular abscesses along the  medial and deep margins of the gluteus maximus, as above. 2. Additional small abscess coursing longitudinally along the sacrum. No overt CT evidence of sacrococcygeal osteomyelitis. Electronically Signed   By: Roanna Banning M.D.   On: 09/19/2023 16:29   MR Lumbar Spine W Wo Contrast Result Date: 09/19/2023 CLINICAL DATA:  38 year old male with back pain radiating down the left leg. Dizziness, shortness of breath. History  of perianal abscess. EXAM: MRI LUMBAR SPINE WITHOUT AND WITH CONTRAST TECHNIQUE: Multiplanar and multiecho pulse sequences of the lumbar spine were obtained without and with intravenous contrast. CONTRAST:  9mL GADAVIST GADOBUTROL 1 MMOL/ML IV SOLN COMPARISON:  Thoracic MRI today reported separately. CT Abdomen and Pelvis 08/22/2023. Pelvis MRI today reported separately. FINDINGS: Segmentation:  Normal, concordant with the thoracic numbering today. Alignment: Stable to mildly improved lumbar lordosis compared to the CT last month. Vertebrae: Generalized nonspecific decreased bone marrow signal in the visible spine and pelvis. Stable lumbar vertebral height. No marrow edema identified in the lumbar spine. However, there is patchy abnormal marrow edema and enhancement identified in the lower sacrum beginning at the S3 central vertebral level (series 8, image 8 and series 12, image 8). Upper sacral segments, upper sacral ala and SI joints appear spared. Conus medullaris and cauda equina: Conus better demonstrated on the thoracic MRI today reported separately. Minimally visible on these images. Cauda equina nerve roots appear to remain normal. No lumbar abnormal intrathecal enhancement or dural thickening. Sacral epidural lipomatosis. Paraspinal and other soft tissues: Negative lumbar paraspinal soft tissues. Visible abdominal viscera stable from the CT Abdomen and Pelvis last month including heterogeneous appearance of the kidneys. Partially visible abnormal presacral enhancement and loculated  appearing fluid or edema (series 12, image 8 and series 8, image 8). This extends up to the ventral lower S2 sacral level. Disc levels: No age advanced lumbar spine degeneration or spinal stenosis is identified. IMPRESSION: 1. Evidence of lower Sacral Osteomyelitis with presacral Abscess, Phlegmon: Partially visible abnormal sacrum beginning at the S3 body with marrow edema and enhancement. And abnormal presacral soft tissue fluid, edema, and enhancement. See further details on Pelvis MRI today reported separately. 2. No acute or inflammatory process identified in the lumbar spine. No lumbar spinal stenosis. Electronically Signed   By: Odessa Fleming M.D.   On: 09/19/2023 06:45   MR THORACIC SPINE W WO CONTRAST Result Date: 09/19/2023 CLINICAL DATA:  38 year old male with back pain radiating down the left leg. Dizziness, shortness of breath. History of perianal abscess. EXAM: MRI THORACIC WITHOUT AND WITH CONTRAST TECHNIQUE: Multiplanar and multiecho pulse sequences of the thoracic spine were obtained without and with intravenous contrast. CONTRAST:  9mL GADAVIST GADOBUTROL 1 MMOL/ML IV SOLN COMPARISON:  CTA chest 04/03/2023. FINDINGS: Limited cervical spine imaging:  Minimally included. Thoracic spine segmentation:  Normal on the comparison CTA. Alignment: Stable thoracic kyphosis compared to last year. No significant scoliosis or spondylolisthesis. Vertebrae: Generalized decreased T1 marrow signal, nonspecific. Bone mineralization within normal limits by CT last year. Stable, generally maintained vertebral body height. No marrow edema or evidence of acute osseous abnormality. No abnormal enhancement identified. Cord: No thoracic spinal cord signal abnormality. Maintained cord volume. Conus medullaris appears normal at T11-T12. No abnormal intradural enhancement. No dural thickening. Paraspinal and other soft tissues: Negative. Negative visualized posterior paraspinal soft tissues. Disc levels: Negative aside from  T3-T4: Moderate to severe facet and/or ligament flavum hypertrophy on the right (series 20, image 14). Mild spinal stenosis. Moderate right T3 neural foraminal stenosis. T4-T5: Moderate similar right ligament flavum hypertrophy. Borderline to mild stenosis. T5-T6: Mild left side ligament flavum hypertrophy (series 20, image 22). No stenosis. T9-T10: Mild to moderate bilateral ligament flavum hypertrophy greater on the left (series 20, image 39). Mild spinal stenosis. Moderate to severe left and mild to moderate right T9 foraminal stenosis. T10-T11: Subtle posterior disc bulging. Mild to moderate facet and/or ligament flavum hypertrophy. Mild spinal stenosis (series 20,  image 45). No significant spinal cord mass effect. Mild to moderate bilateral T10 foraminal stenosis. T11-T12: Moderate facet hypertrophy greater on the left. No spinal stenosis but moderate to severe left T11 foraminal stenosis. T12-L1: Mild to moderate left ligament flavum hypertrophy. Moderate to severe left T12 foraminal stenosis. IMPRESSION: 1. No acute or inflammatory abnormality in the Thoracic Spine. 2. Multilevel thoracic spine degeneration chiefly in the form of posterior element hypertrophy. Isolated mild thoracic spinal stenosis at T10-T11 when combined with disc bulging there. No thoracic spinal cord mass effect or signal abnormality. Moderate to severe associated neural foraminal stenosis at the right T3, right T9, left T11 and T12 nerve levels. Electronically Signed   By: Odessa Fleming M.D.   On: 09/19/2023 06:37   I have personally spent 50 minutes involved in face-to-face and non-face-to-face activities for this patient on the day of the visit. Professional time spent includes the following activities: Preparing to see the patient (review of tests), Obtaining and/or reviewing separately obtained history (admission/discharge record), Performing a medically appropriate examination and/or evaluation , Ordering medications/tests/procedures,  referring and communicating with other health care professionals, Documenting clinical information in the EMR, Independently interpreting results (not separately reported), Communicating results to the patient/family/caregiver, Counseling and educating the patient/family/caregiver and Care coordination (not separately reported).   Plan d/w requesting provider as well as ID pharm D  Of note, portions of this note may have been created with voice recognition software. While this note has been edited for accuracy, occasional wrong-word or 'sound-a-like' substitutions may have occurred due to the inherent limitations of voice recognition software.   Electronically signed by:   Odette Fraction, MD Infectious Disease Physician [facility] Pager: [phone]

## 2023-09-23 ENCOUNTER — Other Ambulatory Visit: Payer: Self-pay | Admitting: *Deleted

## 2023-09-23 ENCOUNTER — Inpatient Hospital Stay (HOSPITAL_COMMUNITY): Payer: Medicaid Other

## 2023-09-23 ENCOUNTER — Other Ambulatory Visit: Payer: Medicaid Other

## 2023-09-23 ENCOUNTER — Other Ambulatory Visit (HOSPITAL_COMMUNITY): Payer: Self-pay

## 2023-09-23 ENCOUNTER — Inpatient Hospital Stay: Admission: RE | Admit: 2023-09-23 | Payer: Medicaid Other | Source: Ambulatory Visit

## 2023-09-23 ENCOUNTER — Telehealth: Payer: Self-pay | Admitting: Pharmacy Technician

## 2023-09-23 ENCOUNTER — Other Ambulatory Visit: Payer: Self-pay

## 2023-09-23 DIAGNOSIS — K604 Rectal fistula, unspecified: Secondary | ICD-10-CM | POA: Diagnosis not present

## 2023-09-23 DIAGNOSIS — K828 Other specified diseases of gallbladder: Secondary | ICD-10-CM | POA: Diagnosis not present

## 2023-09-23 DIAGNOSIS — K4090 Unilateral inguinal hernia, without obstruction or gangrene, not specified as recurrent: Secondary | ICD-10-CM | POA: Diagnosis not present

## 2023-09-23 DIAGNOSIS — K50113 Crohn's disease of large intestine with fistula: Secondary | ICD-10-CM | POA: Diagnosis not present

## 2023-09-23 LAB — BASIC METABOLIC PANEL
Anion gap: 11 (ref 5–15)
BUN: 15 mg/dL (ref 6–20)
CO2: 20 mmol/L — ABNORMAL LOW (ref 22–32)
Calcium: 8.5 mg/dL — ABNORMAL LOW (ref 8.9–10.3)
Chloride: 104 mmol/L (ref 98–111)
Creatinine, Ser: 1.58 mg/dL — ABNORMAL HIGH (ref 0.61–1.24)
GFR, Estimated: 57 mL/min — ABNORMAL LOW (ref >60)
Glucose, Bld: 99 mg/dL (ref 70–99)
Potassium: 4.8 mmol/L (ref 3.5–5.1)
Sodium: 135 mmol/L (ref 135–145)

## 2023-09-23 LAB — CBC
HCT: 25.4 % — ABNORMAL LOW (ref 39.0–52.0)
Hemoglobin: 8.4 g/dL — ABNORMAL LOW (ref 13.0–17.0)
MCH: 27.8 pg (ref 26.0–34.0)
MCHC: 33.1 g/dL (ref 30.0–36.0)
MCV: 84.1 fL (ref 80.0–100.0)
Platelets: 595 10*3/uL — ABNORMAL HIGH (ref 150–400)
RBC: 3.02 MIL/uL — ABNORMAL LOW (ref 4.22–5.81)
RDW: 17.4 % — ABNORMAL HIGH (ref 11.5–15.5)
WBC: 12.9 10*3/uL — ABNORMAL HIGH (ref 4.0–10.5)
nRBC: 0 % (ref 0.0–0.2)

## 2023-09-23 LAB — SEDIMENTATION RATE: Sed Rate: 132 mm/h — ABNORMAL HIGH (ref 0–16)

## 2023-09-23 LAB — C-REACTIVE PROTEIN: CRP: 21.9 mg/dL — ABNORMAL HIGH (ref <1.0)

## 2023-09-23 MED ORDER — IOHEXOL 350 MG/ML SOLN
75.0000 mL | Freq: Once | INTRAVENOUS | Status: AC | PRN
  Administered 2023-09-23: 75 mL via INTRAVENOUS

## 2023-09-23 MED ORDER — LIDOCAINE HCL URETHRAL/MUCOSAL 2 % EX GEL: 1.0000 | Freq: Every day | CUTANEOUS | Status: DC | PRN

## 2023-09-23 MED ORDER — LIDOCAINE HCL URETHRAL/MUCOSAL 2 % EX GEL
1.0000 | Freq: Once | CUTANEOUS | Status: AC
  Administered 2023-09-23: 1 via TOPICAL
  Filled 2023-09-23: qty 6

## 2023-09-23 MED ORDER — HYDROCORTISONE ACETATE 25 MG RE SUPP
25.0000 mg | Freq: Every day | RECTAL | Status: DC
  Administered 2023-09-23: 25 mg via RECTAL
  Filled 2023-09-23: qty 1

## 2023-09-23 NOTE — TOC Progression Note (Signed)
Transition of Care (TOC) - Progression Note   Pam with [employer] met with him last night and provided teaching .   Judeth Cornfield with ID signed forms needed to submit to patient's insurance for approval for IV ABX. NCM emailed signed forms to [facility] with [employer]    Patient Details  Name: Shawn [name] MRN: [id number] Date of Birth: 1986/05/28  Transition of Care [facility]) CM/SW Contact  Story Vanvranken, Adria Devon, RN Phone Number: 09/23/2023, 11:03 AM  Clinical Narrative:       Expected Discharge Plan:  (await treatment plan) Barriers to Discharge: Continued Medical Work up  Expected Discharge Plan and Services   Discharge Planning Services: CM Consult   Living arrangements for the past 2 months: Single Family Home                                       Social Determinants of Health (SDOH) Interventions SDOH Screenings   Food Insecurity: No Food Insecurity (09/19/2023)  Housing: Low Risk  (09/19/2023)  Transportation Needs: No Transportation Needs (09/19/2023)  Utilities: Not At Risk (09/19/2023)  Alcohol Screen: Low Risk  (09/18/2023)  Depression (PHQ2-9): Medium Risk (01/05/2023)  Financial Resource Strain: Low Risk  (09/18/2023)  Physical Activity: Sufficiently Active (09/18/2023)  Social Connections: Socially Isolated (09/18/2023)  Stress: Stress Concern Present (09/18/2023)  Tobacco Use: Medium Risk (09/18/2023)    Readmission Risk Interventions     No data to display

## 2023-09-23 NOTE — Telephone Encounter (Signed)
Ms.Kim/PA team-  Please initiate Stelara induction PA for patient. Therapy plan has been entered in EPIC.  Please let our office know when/if approved, but do NOT schedule induction at this time. We are awaiting perianal abscess improvement before starting that first dose of biologic therapy.  Thank you!

## 2023-09-23 NOTE — Telephone Encounter (Addendum)
 Auth Submission: NO AUTH NEEDED Site of care: Site of care: [facility] Payer: health blue medicaid Medication & CPT/J Code(s) submitted: Stelara Infusion (Ustekinumab) [id number] Route of submission (phone, fax, portal):  Phone # Fax # Auth type: Buy/Bill PB Units/visits requested: 1 Reference number: [id number] Approval from: 09/23/23 to 11/22/23

## 2023-09-23 NOTE — Telephone Encounter (Signed)
Dorothy, F/u:  no auth needed for med[employer] benefit. Awaiting response from [facility] for pharmacy benefit.

## 2023-09-23 NOTE — Progress Notes (Addendum)
HD#4 Subjective:  Overnight Events: No acute event overnight. He received hydrocortisone suppository for rectal pain.  Patient examined at bedside.  He is reporting 10/10 pain. He continue to have rectal pain with the large BM he had 2 days ago.  He says his belly is slightly bigger, he is not passing gas.   Objective:  Vital signs in last 24 hours: Vitals:   09/22/23 1552 09/22/23 2032 09/23/23 0600 09/23/23 0750  BP: 111/68 109/66 116/74 (!) 97/55  Pulse: 78 87 78 75  Resp: 17 16 16 17   Temp: 99 F (37.2 C) 99.3 F (37.4 C) 98.9 F (37.2 C) 98.6 F (37 C)  TempSrc: Oral Oral Oral Oral  SpO2: 100% 99% 98% 99%  Weight:      Height:       Supplemental O2: Room Air SpO2: 99 % O2 Flow Rate (L/min): 2 L/min   Physical Exam:   General: NAD. CV: RRR. No m/r/g. No LE edema Pulmonary: Lungs CTAB. Normal effort. No wheezing or rales. Abdominal: Soft, abdominal distention with decreased bowel sounds.  No signs of peritonitis. GU:  Gluteal cleft without drainage with dressing in place.   Filed Weights   09/18/23 1658  Weight: 93 kg     Intake/Output Summary (Last 24 hours) at 09/23/2023 1354 Last data filed at 09/22/2023 1834 Gross per 24 hour  Intake 540 ml  Output --  Net 540 ml   Net IO Since Admission: 2,558.06 mL [09/23/23 1354]  No results for input(s): "GLUCAP" in the last 72 hours.   Pertinent Labs:    Latest Ref Rng & Units 09/23/2023    6:33 AM 09/22/2023    5:43 AM 09/21/2023    7:19 AM  CBC  WBC 4.0 - 10.5 K/uL 12.9  11.9  10.7   Hemoglobin 13.0 - 17.0 g/dL 8.4  8.2  8.2   Hematocrit 39.0 - 52.0 % 25.4  24.7  24.7   Platelets 150 - 400 K/uL 595  558  527        Latest Ref Rng & Units 09/23/2023    6:33 AM 09/22/2023    5:43 AM 09/21/2023    7:19 AM  CMP  Glucose 70 - 99 mg/dL 99  865  784   BUN 6 - 20 mg/dL 15  16  17    Creatinine 0.61 - 1.24 mg/dL 6.96  2.95  2.84   Sodium 135 - 145 mmol/L 135  137  134   Potassium 3.5 - 5.1 mmol/L 4.8   4.7  4.4   Chloride 98 - 111 mmol/L 104  105  101   CO2 22 - 32 mmol/L 20  20  20    Calcium 8.9 - 10.3 mg/dL 8.5  8.5  8.4     Imaging: No results found.   Assessment/Plan:   Principal Problem:   Crohn's proctitis, with fistula (HCC) Active Problems:   Rectal pain   Crohn's disease of small intestine (HCC)   Pelvic abscess in male [facility])   High anal fistula   CKD (chronic kidney disease) stage 3, GFR 30-59 ml/min (HCC)   Osteomyelitis of sacrum (HCC)   Normochromic normocytic anemia   Patient Summary: Shawn [name] is a 38 y.o. M with PMH Crohn's colitis with anorectal fistula, prior coccyx osteomyelitis, macrocytic anemia, CKD stage 3a, who presented with 1 month of low back pain admitted for sacral abscess and sacral osteomyelitis.  # Osteomyelitis of sacrum # Coccyx osteomyelitis # Crohn's disease with anorectal  fistula. # Chron's proctitis  He has rectal pain, mild abdominal distention, with decreased bowel sounds.  No signs of peritonitis. On labs, WBCs, ESR and CRP uptrending.  Culture of the aspirate NGTD at 96 hours.  With patient's history of Chron's disease, I query if he has strictures developing, with worsening distension.  Will evaluate this further with CT abdomen and pelvis with contrast. ID following, will need a PICC line for po IV daptomycin, ceftriaxone and metronidazole.   GI following, will follow with patient in clinic, with a tentative plan to restart biologic once this acute infection resolves.  He is hepatitis B negative,  will follow-up on TB QuantiFERON lab  - GI and Infectious disease following, appreciate recs. -  Follow-up on CT abdomen and pelvis. -  Will need a PICC line. - Cw Dap/ceftriaxone/metronidazole. - Follow-up with Cx from the abscess  - Will need CT/MRI 7-10 for re-evaluation of fluid collection. - Hydrocortisone suppository 25 mg - Scheduled MiraLAX BID.  - Scheduled Tylenol, and Oxy 5 mg as needed   # Acute on chronic  normocytic anemia. # History of vit B12 deficiency. Hb  8.4.  He received his vitamin B12 shot yesterday.  I will consistent with anemia of chronic disease, will need vitamin B12 shots every week.  Labs consistent with anemia of chronic disease [low iron and elevated ferritin ]   - CBC - Vitamin B12 shots weekly shots. - Transfuse if Hb< 7  # Elevated Scr on  CKD 3A   SCr 1.80>>1.50  Baseline Scr 1.5-1.7. Would encourage good oral hydration  - BMP - Avoid nephrotoxic drugs  Diet: NPO. IVF:  None VTE: heparin injection 5,000 Units Start: 09/20/23 2200 Code: Full PT/OT: Pending ID:  Anti-infectives (From admission, onward)    Start     Dose/Rate Route Frequency Ordered Stop   09/22/23 0000  cefTRIAXone (ROCEPHIN) IVPB        2 g Intravenous Every 24 hours 09/22/23 1544 11/01/23 2359   09/22/23 0000  daptomycin (CUBICIN) IVPB        700 mg Intravenous Every 24 hours 09/22/23 1544 11/01/23 2359   09/22/23 0000  metroNIDAZOLE (FLAGYL) 500 MG tablet        500 mg Oral 2 times daily 09/22/23 1544 11/01/23 2359   09/20/23 2200  metroNIDAZOLE (FLAGYL) tablet 500 mg        500 mg Oral Every 12 hours 09/20/23 1600     09/20/23 1700  DAPTOmycin (CUBICIN) IVPB 700 mg/178mL premix        8 mg/kg  93 kg 200 mL/hr over 30 Minutes Intravenous Daily 09/20/23 1600     09/20/23 1700  cefTRIAXone (ROCEPHIN) 2 g in sodium chloride 0.9 % 100 mL IVPB        2 g 200 mL/hr over 30 Minutes Intravenous Every 24 hours 09/20/23 1600     09/19/23 1130  piperacillin-tazobactam (ZOSYN) IVPB 3.375 g  Status:  Discontinued        3.375 g 12.5 mL/hr over 240 Minutes Intravenous Every 8 hours 09/19/23 1103 09/19/23 1331   09/19/23 0745  ceFEPIme (MAXIPIME) 2 g in sodium chloride 0.9 % 100 mL IVPB  Status:  Discontinued        2 g 200 mL/hr over 30 Minutes Intravenous  Once 09/19/23 0743 09/19/23 0920   09/19/23 0745  vancomycin (VANCOCIN) IVPB 1000 mg/200 mL premix  Status:  Discontinued       Placed in  "And" Linked Group  1,000 mg 200 mL/hr over 60 Minutes Intravenous  Once 09/19/23 0743 09/19/23 0920   09/19/23 0745  vancomycin (VANCOCIN) IVPB 1000 mg/200 mL premix       Placed in "And" Linked Group   1,000 mg 200 mL/hr over 60 Minutes Intravenous  Once 09/19/23 0743 09/19/23 1610        Anticipated discharge to pending medical stabilization.  Laretta Bolster, MD 09/23/2023, 1:54 PM Pager: [phone] [facility]  Please contact the on call pager after 5 pm and on weekends at [phone].

## 2023-09-23 NOTE — Telephone Encounter (Signed)
  Dorothy, Thanks - I will submit the auth ad f/u once I have a response.   Monchell, Please begin the auth process for the maintenance dosing.  Selena Batten

## 2023-09-24 ENCOUNTER — Other Ambulatory Visit (HOSPITAL_COMMUNITY): Payer: Self-pay

## 2023-09-24 DIAGNOSIS — K604 Rectal fistula, unspecified: Secondary | ICD-10-CM | POA: Diagnosis not present

## 2023-09-24 DIAGNOSIS — K50113 Crohn's disease of large intestine with fistula: Secondary | ICD-10-CM | POA: Diagnosis not present

## 2023-09-24 LAB — CBC
HCT: 25.3 % — ABNORMAL LOW (ref 39.0–52.0)
Hemoglobin: 8.4 g/dL — ABNORMAL LOW (ref 13.0–17.0)
MCH: 27.7 pg (ref 26.0–34.0)
MCHC: 33.2 g/dL (ref 30.0–36.0)
MCV: 83.5 fL (ref 80.0–100.0)
Platelets: 610 10*3/uL — ABNORMAL HIGH (ref 150–400)
RBC: 3.03 MIL/uL — ABNORMAL LOW (ref 4.22–5.81)
RDW: 17.3 % — ABNORMAL HIGH (ref 11.5–15.5)
WBC: 12.8 10*3/uL — ABNORMAL HIGH (ref 4.0–10.5)
nRBC: 0 % (ref 0.0–0.2)

## 2023-09-24 LAB — SEDIMENTATION RATE: Sed Rate: 122 mm/h — ABNORMAL HIGH (ref 0–16)

## 2023-09-24 LAB — CULTURE, BLOOD (ROUTINE X 2)
Culture: NO GROWTH
Culture: NO GROWTH

## 2023-09-24 LAB — BASIC METABOLIC PANEL
Anion gap: 11 (ref 5–15)
BUN: 16 mg/dL (ref 6–20)
CO2: 20 mmol/L — ABNORMAL LOW (ref 22–32)
Calcium: 8.7 mg/dL — ABNORMAL LOW (ref 8.9–10.3)
Chloride: 105 mmol/L (ref 98–111)
Creatinine, Ser: 1.54 mg/dL — ABNORMAL HIGH (ref 0.61–1.24)
GFR, Estimated: 58 mL/min — ABNORMAL LOW (ref >60)
Glucose, Bld: 112 mg/dL — ABNORMAL HIGH (ref 70–99)
Potassium: 4.6 mmol/L (ref 3.5–5.1)
Sodium: 136 mmol/L (ref 135–145)

## 2023-09-24 LAB — HEPATITIS B CORE ANTIBODY, TOTAL: HEP B CORE AB: NEGATIVE

## 2023-09-24 LAB — C-REACTIVE PROTEIN: CRP: 23.5 mg/dL — ABNORMAL HIGH (ref <1.0)

## 2023-09-24 MED ORDER — OXYCODONE HCL 5 MG PO TABS: 10.0000 mg | ORAL_TABLET | ORAL | Status: DC | PRN

## 2023-09-24 MED ORDER — HYDROCORTISONE ACETATE 25 MG RE SUPP
25.0000 mg | Freq: Every day | RECTAL | Status: DC
Start: 2023-09-24 — End: 2023-09-27
  Administered 2023-09-24: 25 mg via RECTAL
  Filled 2023-09-24 (×2): qty 1

## 2023-09-24 MED ORDER — HYDROMORPHONE HCL 1 MG/ML IJ SOLN
1.0000 mg | INTRAMUSCULAR | Status: DC | PRN
  Administered 2023-09-24 – 2023-09-26 (×12): 1 mg via INTRAVENOUS
  Filled 2023-09-24 (×13): qty 1

## 2023-09-24 MED ORDER — LIDOCAINE HCL URETHRAL/MUCOSAL 2 % EX GEL
1.0000 | Freq: Every day | CUTANEOUS | Status: DC
  Administered 2023-09-24: 1 via TOPICAL
  Filled 2023-09-24 (×3): qty 6

## 2023-09-24 MED ORDER — SENNA 8.6 MG PO TABS
1.0000 | ORAL_TABLET | Freq: Every day | ORAL | Status: DC
  Administered 2023-09-24: 8.6 mg via ORAL
  Filled 2023-09-24: qty 1

## 2023-09-24 NOTE — Progress Notes (Signed)
IR Procedure Request Presacral Abscess Drain Placement   39 y.o. male inpatient. History of Crohn;s, CKD, cardiomyopathy, perirectal abscess. [facility]ed [facility] on 1.27.25 with low back pain X several months. and dizziness. Found to have a left gluteal abscess. IR aspirated  3 ml of purulent fluid from left gluteal abscess on 1.28.25. Cultures shows no growth X 4 days.  Due to ongoing rectal pain a CT  Abd pelvis was obtained today. CT  reads . Persistent left-sided anorectal fistula with gas containing tracts extending into the presacral space and bilateral gluteal musculature. Increased gas and decreased food within the tracts compared with 09/19/2023 likely secondary to aspiration performed 09/20/2023. Otherwise these are grossly similar to 09/19/2023 given differences in contrast timing. Team is requesting evaluation for presacral abscess drain.   After review of procedure request by IR Attending Dr. Richarda Overlie area in question looks to be like chronic thickening with air and is there for not amenable to drainage. This was communicated directly to the Team via EPIC Chat

## 2023-09-24 NOTE — Progress Notes (Signed)
HD#5 Subjective:  Overnight Events: No acute event overnight.  Patient continues to have rectal pain. Some improvement with hydrocortisone suppository. Pain regimen does not provide long enough relief. No BM today and initially hesitant with Miralax. Discussed stool burden on CT scan and working on pain control.    Objective:  Vital signs in last 24 hours: Vitals:   09/23/23 0750 09/23/23 1513 09/23/23 2008 09/24/23 0358  BP: (!) 97/55 122/70 120/69 113/70  Pulse: 75 83 85 73  Resp: 17 18 18 16   Temp: 98.6 F (37 C) 98.5 F (36.9 C) 99.8 F (37.7 C) 98.7 F (37.1 C)  TempSrc: Oral Oral Oral Oral  SpO2: 99% 100% 99% 99%  Weight:      Height:       Supplemental O2: Room Air  Physical Exam:  General: alert, male laying in bed comfortably, NAD CV: RRR Pulmonary: Normal work of breathing on RA Abdominal: Bowel sounds present. Soft, diffuse tenderness on palpation. No signs of acute abdomen.  GU:  Prior Exam: Gluteal cleft without drainage with dressing in place.   Filed Weights   09/18/23 1658  Weight: 93 kg     Intake/Output Summary (Last 24 hours) at 09/24/2023 1610 Last data filed at 09/23/2023 1700 Gross per 24 hour  Intake 700 ml  Output --  Net 700 ml   Net IO Since Admission: 3,258.06 mL [09/24/23 0624]  No results for input(s): "GLUCAP" in the last 72 hours.   Pertinent Labs:    Latest Ref Rng & Units 09/23/2023    6:33 AM 09/22/2023    5:43 AM 09/21/2023    7:19 AM  CBC  WBC 4.0 - 10.5 K/uL 12.9  11.9  10.7   Hemoglobin 13.0 - 17.0 g/dL 8.4  8.2  8.2   Hematocrit 39.0 - 52.0 % 25.4  24.7  24.7   Platelets 150 - 400 K/uL 595  558  527        Latest Ref Rng & Units 09/23/2023    6:33 AM 09/22/2023    5:43 AM 09/21/2023    7:19 AM  CMP  Glucose 70 - 99 mg/dL 99  960  454   BUN 6 - 20 mg/dL 15  16  17    Creatinine 0.61 - 1.24 mg/dL 0.98  1.19  1.47   Sodium 135 - 145 mmol/L 135  137  134   Potassium 3.5 - 5.1 mmol/L 4.8  4.7  4.4   Chloride 98 -  111 mmol/L 104  105  101   CO2 22 - 32 mmol/L 20  20  20    Calcium 8.9 - 10.3 mg/dL 8.5  8.5  8.4     Imaging: CT ABDOMEN PELVIS W CONTRAST Result Date: 09/24/2023 CLINICAL DATA:  Back pain, dizziness, Crohn's exacerbation EXAM: CT ABDOMEN AND PELVIS WITH CONTRAST TECHNIQUE: Multidetector CT imaging of the abdomen and pelvis was performed using the standard protocol following bolus administration of intravenous contrast. RADIATION DOSE REDUCTION: This exam was performed according to the departmental dose-optimization program which includes automated exposure control, adjustment of the mA and/or kV according to patient size and/or use of iterative reconstruction technique. CONTRAST:  75mL OMNIPAQUE IOHEXOL 350 MG/ML SOLN COMPARISON:  CT pelvis 09/19/2023; MRI pelvis 09/19/2023; CT abdomen and pelvis 08/22/2023 FINDINGS: Lower chest: No acute abnormality. Hepatobiliary: Unremarkable noncontrast appearance of the liver. Distended gallbladder. No radiopaque stone or biliary dilation. Pancreas: Unremarkable. Spleen: Unremarkable. Adrenals/Urinary Tract: Normal adrenal glands. Stable cystic lesions in both kidneys.  No follow-up is recommended. No urinary calculi or hydronephrosis. Stomach/Bowel: Normal caliber large and small bowel. Increased moderate colonic stool burden. Stomach and appendix are within normal limits. No bowel wall thickening. Terminal ileum is unremarkable. The Similar appearance of the seton at the gluteal cleft posterior to the anus. Soft tissue thickening and multiple gas containing tracts extending from the gluteal cleft into the adjacent gluteal musculature and left posterior anal sphincter. Presacral soft tissue thickening. Increased gas within the tracts compared to 09/19/2023 likely due to aspiration 09/20/2023. The fluid component is less conspicuous likely due to aspiration and differences in contrast timing compared with 09/19/2023. Vascular/Lymphatic: No significant vascular findings  are present. No enlarged abdominal or pelvic lymph nodes. Reproductive: Unremarkable. Other: Trace free fluid in the pelvis. Nonspecific stranding in the left anterior abdominal wall possibly related to injections. Small fat containing right inguinal hernia. No free intraperitoneal air. Musculoskeletal: No acute fracture. IMPRESSION: 1. Persistent left-sided anorectal fistula with gas containing tracts extending into the presacral space and bilateral gluteal musculature. Increased gas and decreased food within the tracts compared with 09/19/2023 likely secondary to aspiration performed 09/20/2023. Otherwise these are grossly similar to 09/19/2023 given differences in contrast timing. 2. Increased, moderate colonic stool burden. Correlate for constipation. No evidence of obstruction. Electronically Signed   By: Minerva Fester M.D.   On: 09/24/2023 00:59   [place] EKG SITE RITE Result Date: 09/23/2023 If Site Rite image not attached, placement could not be confirmed due to current cardiac rhythm.    Assessment/Plan:   Principal Problem:   Crohn's proctitis, with fistula (HCC) Active Problems:   Rectal pain   Crohn's disease of small intestine (HCC)   Pelvic abscess in male [facility])   High anal fistula   CKD (chronic kidney disease) stage 3, GFR 30-59 ml/min (HCC)   Osteomyelitis of sacrum (HCC)   Normochromic normocytic anemia   Patient Summary: Shawn [name] is a 38 y.o. M with PMH Crohn's colitis with anorectal fistula, prior coccyx osteomyelitis, macrocytic anemia, CKD stage 3a, who presented with 1 month of low back pain admitted for sacral abscess and sacral osteomyelitis.  # Osteomyelitis of sacrum # Coccyx osteomyelitis # Crohn's disease with anorectal fistula. # Chron's proctitis  Continues to have rectal pain, mild abdominal distention, noted bowel sounds.  No signs of peritonitis. WBC steady at 12.8. Afebrile.  CRP trending up still. Having IR re-evaluate for presacral abscess for  possible aspiration/drainage. 1/28 had left gluteal abscess drained, no growth seen. Repeat CT A/P without acute findings, noted increased stool burden, no obstruction. ID following, will need a PICC line for po IV daptomycin, ceftriaxone and metronidazole.    GI following, will follow with patient in clinic, with a tentative plan to restart biologic once this acute infection resolves.  He is hepatitis B negative,  will follow-up on TB QuantiFERON lab. - GI and Infectious disease following, appreciate recs. - Will need a PICC line before d/c - Continue with Dapto/ceftriaxone/metronidazole. - Follow-up with Cx from the abscess  - Hydrocortisone suppository 25 mg at bedtime with lidocaine jelly  - Scheduled MiraLAX BID.  - Scheduled Tylenol, and Oxy 10 mg Q4H PRN and dilaudid 1 mg Q3H PRN  # Acute on chronic normocytic anemia. # History of vit B12 deficiency. Stable with Hgb 8.4.  He received his vitamin B12 IM on 1/30. Consistent with anemia of chronic disease, will need vitamin B12 shots every week.  Labs consistent with anemia of chronic disease [low iron and elevated ferritin] -  Trend CBC, monitor for signs of bleeding - Vitamin B12 shots weekly - Transfuse if Hb< 7  # Elevated Scr on  CKD 3A   SCr 1.80>>1.54  Baseline Scr 1.5-1.7. Continue encourage good oral hydration. - Trend BMP - Avoid nephrotoxic drugs  Diet: regular IVF:  None VTE: heparin injection 5,000 Units Start: 09/20/23 2200 Code: Full PT/OT: Pending ID:  Anti-infectives (From admission, onward)    Start     Dose/Rate Route Frequency Ordered Stop   09/22/23 0000  cefTRIAXone (ROCEPHIN) IVPB        2 g Intravenous Every 24 hours 09/22/23 1544 11/01/23 2359   09/22/23 0000  daptomycin (CUBICIN) IVPB        700 mg Intravenous Every 24 hours 09/22/23 1544 11/01/23 2359   09/22/23 0000  metroNIDAZOLE (FLAGYL) 500 MG tablet        500 mg Oral 2 times daily 09/22/23 1544 11/01/23 2359   09/20/23 2200  metroNIDAZOLE  (FLAGYL) tablet 500 mg        500 mg Oral Every 12 hours 09/20/23 1600     09/20/23 1700  DAPTOmycin (CUBICIN) IVPB 700 mg/152mL premix        8 mg/kg  93 kg 200 mL/hr over 30 Minutes Intravenous Daily 09/20/23 1600     09/20/23 1700  cefTRIAXone (ROCEPHIN) 2 g in sodium chloride 0.9 % 100 mL IVPB        2 g 200 mL/hr over 30 Minutes Intravenous Every 24 hours 09/20/23 1600     09/19/23 1130  piperacillin-tazobactam (ZOSYN) IVPB 3.375 g  Status:  Discontinued        3.375 g 12.5 mL/hr over 240 Minutes Intravenous Every 8 hours 09/19/23 1103 09/19/23 1331   09/19/23 0745  ceFEPIme (MAXIPIME) 2 g in sodium chloride 0.9 % 100 mL IVPB  Status:  Discontinued        2 g 200 mL/hr over 30 Minutes Intravenous  Once 09/19/23 0743 09/19/23 0920   09/19/23 0745  vancomycin (VANCOCIN) IVPB 1000 mg/200 mL premix  Status:  Discontinued       Placed in "And" Linked Group   1,000 mg 200 mL/hr over 60 Minutes Intravenous  Once 09/19/23 0743 09/19/23 0920   09/19/23 0745  vancomycin (VANCOCIN) IVPB 1000 mg/200 mL premix       Placed in "And" Linked Group   1,000 mg 200 mL/hr over 60 Minutes Intravenous  Once 09/19/23 0743 09/19/23 0928        Anticipated discharge to Home pending medical stabilization.  Rana Snare, DO Internal Medicine Resident PGY-2 Pager: [phone] Please contact the on-call pager after 5 pm and on weekends at [phone].

## 2023-09-24 NOTE — Plan of Care (Signed)
Patient refusing, Oxy, miralax and fibercon. MD notifed.  Pain controlled with IV Dilaudid.  No BM or flatus today.  Patient did ambulate around the unit.  Education given on laxatives and constipation.   Problem: Education: Goal: Knowledge of General Education information will improve Description: Including pain rating scale, medication(s)/side effects and non-pharmacologic comfort measures Outcome: Progressing   Problem: Health Behavior/Discharge Planning: Goal: Ability to manage health-related needs will improve Outcome: Progressing   Problem: Clinical Measurements: Goal: Ability to maintain clinical measurements within normal limits will improve Outcome: Progressing Goal: Will remain free from infection Outcome: Progressing Goal: Diagnostic test results will improve Outcome: Progressing Goal: Respiratory complications will improve Outcome: Progressing Goal: Cardiovascular complication will be avoided Outcome: Progressing

## 2023-09-25 ENCOUNTER — Encounter (HOSPITAL_COMMUNITY): Payer: Self-pay | Admitting: Internal Medicine

## 2023-09-25 DIAGNOSIS — K50113 Crohn's disease of large intestine with fistula: Secondary | ICD-10-CM | POA: Diagnosis not present

## 2023-09-25 DIAGNOSIS — K604 Rectal fistula, unspecified: Secondary | ICD-10-CM | POA: Diagnosis not present

## 2023-09-25 LAB — AEROBIC/ANAEROBIC CULTURE W GRAM STAIN (SURGICAL/DEEP WOUND): Culture: NO GROWTH

## 2023-09-25 LAB — CBC
HCT: 25.9 % — ABNORMAL LOW (ref 39.0–52.0)
Hemoglobin: 8.5 g/dL — ABNORMAL LOW (ref 13.0–17.0)
MCH: 27.6 pg (ref 26.0–34.0)
MCHC: 32.8 g/dL (ref 30.0–36.0)
MCV: 84.1 fL (ref 80.0–100.0)
Platelets: 600 10*3/uL — ABNORMAL HIGH (ref 150–400)
RBC: 3.08 MIL/uL — ABNORMAL LOW (ref 4.22–5.81)
RDW: 17.2 % — ABNORMAL HIGH (ref 11.5–15.5)
WBC: 13.3 10*3/uL — ABNORMAL HIGH (ref 4.0–10.5)
nRBC: 0 % (ref 0.0–0.2)

## 2023-09-25 LAB — C-REACTIVE PROTEIN: CRP: 21.2 mg/dL — ABNORMAL HIGH (ref <1.0)

## 2023-09-25 MED ORDER — SODIUM CHLORIDE 0.9% FLUSH
10.0000 mL | Freq: Two times a day (BID) | INTRAVENOUS | Status: DC
  Administered 2023-09-25 – 2023-09-26 (×3): 10 mL

## 2023-09-25 MED ORDER — CHLORHEXIDINE GLUCONATE CLOTH 2 % EX PADS
6.0000 | MEDICATED_PAD | Freq: Every day | CUTANEOUS | Status: DC
  Administered 2023-09-25 – 2023-09-26 (×2): 6 via TOPICAL

## 2023-09-25 MED ORDER — HYDROMORPHONE HCL 2 MG PO TABS
2.0000 mg | ORAL_TABLET | ORAL | Status: DC | PRN
  Administered 2023-09-25 – 2023-09-26 (×2): 2 mg via ORAL
  Filled 2023-09-25 (×2): qty 1

## 2023-09-25 MED ORDER — SENNA 8.6 MG PO TABS
2.0000 | ORAL_TABLET | Freq: Every day | ORAL | Status: DC
  Administered 2023-09-25: 17.2 mg via ORAL
  Filled 2023-09-25 (×2): qty 2

## 2023-09-25 MED ORDER — SODIUM CHLORIDE 0.9% FLUSH: 10.0000 mL | INTRAVENOUS | Status: DC | PRN

## 2023-09-25 NOTE — Progress Notes (Signed)
Peripherally Inserted Central Catheter Placement  The IV Nurse, Chari Manning has discussed with the patient and/or persons authorized to consent for the patient, the purpose of this procedure and the potential benefits and risks involved with this procedure.  The benefits include less needle sticks, lab draws from the catheter, and the patient may be discharged home with the catheter. Risks include, but not limited to, infection, bleeding, blood clot (thrombus formation), and puncture of an artery; nerve damage and irregular heartbeat and possibility to perform a PICC exchange if needed/ordered by physician.  Alternatives to this procedure were also discussed.  Bard Power PICC patient education guide, fact sheet on infection prevention and patient information card has been provided to patient /or left at bedside.    PICC Placement Documentation  PICC Single Lumen 09/25/23 Right Cephalic 39 cm 0 cm (Active)  Indication for Insertion or Continuance of Line Home intravenous therapies (PICC only) 09/25/23 0909  Exposed Catheter (cm) 0 cm 09/25/23 0909  Site Assessment Clean, Dry, Intact 09/25/23 0909  Line Status Saline locked;Blood return noted 09/25/23 0909  Dressing Type Transparent;Securing device 09/25/23 0272  Dressing Status Antimicrobial disc/dressing in place;Clean, Dry, Intact 09/25/23 5366  Line Care Connections checked and tightened 09/25/23 4403  Line Adjustment (NICU/IV Team Only) No 09/25/23 0909  Dressing Intervention New dressing;Adhesive placed at insertion site (IV team only) 09/25/23 0909  Dressing Change Due 10/02/23 09/25/23 0909       Burnard Bunting Chenice 09/25/2023, 9:09 AM

## 2023-09-25 NOTE — Plan of Care (Signed)
Pt refuses Dilaudid p.o PICC Line placed.   Problem: Education: Goal: Knowledge of General Education information will improve Description: Including pain rating scale, medication(s)/side effects and non-pharmacologic comfort measures Outcome: Progressing   Problem: Health Behavior/Discharge Planning: Goal: Ability to manage health-related needs will improve Outcome: Progressing   Problem: Clinical Measurements: Goal: Ability to maintain clinical measurements within normal limits will improve Outcome: Progressing Goal: Will remain free from infection Outcome: Progressing Goal: Diagnostic test results will improve Outcome: Progressing Goal: Respiratory complications will improve Outcome: Progressing Goal: Cardiovascular complication will be avoided Outcome: Progressing   Problem: Nutrition: Goal: Adequate nutrition will be maintained Outcome: Progressing

## 2023-09-25 NOTE — Progress Notes (Signed)
HD#6 Subjective:  Overnight Events: No acute event overnight.  Patient continues to have rectal pain but not worsening today. More comfortable this morning. Hydrocortisone suppositories have helped. Aware about constipation from pain medications and needing bowel regimen.   Objective:  Vital signs in last 24 hours: Vitals:   09/24/23 0844 09/24/23 1537 09/24/23 2028 09/25/23 0530  BP: 113/77 115/70 115/66 106/74  Pulse: 70 80 81   Resp: 18 20 16 20   Temp: 98.6 F (37 C) 98.4 F (36.9 C) 98.8 F (37.1 C) 98.2 F (36.8 C)  TempSrc: Oral  Oral Oral  SpO2: 97% 99% 100% 100%  Weight:      Height:       Supplemental O2: Room Air  Physical Exam:  General: alert, male laying in bed comfortably, NAD CV: regular rate Pulmonary: Normal work of breathing on RA Abdominal: Bowel sounds present. Soft, diffuse tenderness on palpation. No signs of acute abdomen.  GU:  Prior Exam: Gluteal cleft without drainage with dressing in place.   Filed Weights   09/18/23 1658  Weight: 93 kg     Intake/Output Summary (Last 24 hours) at 09/25/2023 0635 Last data filed at 09/24/2023 2105 Gross per 24 hour  Intake 1680 ml  Output --  Net 1680 ml   Net IO Since Admission: 4,938.06 mL [09/25/23 0635]  No results for input(s): "GLUCAP" in the last 72 hours.   Pertinent Labs:    Latest Ref Rng & Units 09/24/2023    5:50 AM 09/23/2023    6:33 AM 09/22/2023    5:43 AM  CBC  WBC 4.0 - 10.5 K/uL 12.8  12.9  11.9   Hemoglobin 13.0 - 17.0 g/dL 8.4  8.4  8.2   Hematocrit 39.0 - 52.0 % 25.3  25.4  24.7   Platelets 150 - 400 K/uL 610  595  558        Latest Ref Rng & Units 09/24/2023    5:50 AM 09/23/2023    6:33 AM 09/22/2023    5:43 AM  CMP  Glucose 70 - 99 mg/dL 409  99  811   BUN 6 - 20 mg/dL 16  15  16    Creatinine 0.61 - 1.24 mg/dL 9.14  7.82  9.56   Sodium 135 - 145 mmol/L 136  135  137   Potassium 3.5 - 5.1 mmol/L 4.6  4.8  4.7   Chloride 98 - 111 mmol/L 105  104  105   CO2 22 - 32  mmol/L 20  20  20    Calcium 8.9 - 10.3 mg/dL 8.7  8.5  8.5     Imaging: No results found.    Assessment/Plan:   Principal Problem:   Crohn's proctitis, with fistula (HCC) Active Problems:   Rectal pain   Crohn's disease of small intestine (HCC)   Pelvic abscess in male [facility])   High anal fistula   CKD (chronic kidney disease) stage 3, GFR 30-59 ml/min (HCC)   Osteomyelitis of sacrum (HCC)   Normochromic normocytic anemia   Patient Summary: Shawn [name] is a 38 y.o. M with PMH Crohn's colitis with anorectal fistula, prior coccyx osteomyelitis, macrocytic anemia, CKD stage 3a, who presented with 1 month of low back pain admitted for sacral abscess and sacral osteomyelitis.  # Osteomyelitis of sacrum # Coccyx osteomyelitis # Crohn's disease with anorectal fistula. # Chron's proctitis  Continues to have rectal pain, mild abdominal distention, noted bowel sounds.  No signs of peritonitis. WBC up 13.3.  Afebrile.  CRP trending down. No further drainage/aspiration by IR. 1/28 had left gluteal abscess drained, no growth. Repeat CT A/P without acute findings, noted increased stool burden, no obstruction. ID following, will need a PICC line for IV daptomycin, ceftriaxone and metronidazole. GI following, has arranged outpt f/u, with a tentative plan to restart biologic once this acute infection resolves.  He is hepatitis B negative,  will follow-up on TB QuantiFERON lab. - GI and ID following, appreciate recs. - Will need a PICC line before d/c - Continue with Dapto/ceftriaxone/metronidazole. - Hydrocortisone suppository 25 mg at bedtime with lidocaine jelly  - Scheduled MiraLAX BID and senna daily  - Scheduled Tylenol, and dilaudid po 2mg  Q4H PRN (mod/severe) and dilaudid IV 1 mg Q3H PRN (breakthrough)  # Acute on chronic normocytic anemia. # History of vit B12 deficiency. Stable with Hgb 8.5.  He received his vitamin B12 IM on 1/30. Consistent with anemia of chronic disease, will need  weekly B12 IM injections for 4 weeks then monthly likely.  Labs consistent with anemia of chronic disease [low iron and elevated ferritin] - Trend CBC, monitor for signs of bleeding - Vitamin B12 shots weekly - Transfuse if Hb< 7  # Elevated Scr on CKD 3A   On admission Scr elevated 1.80, now improved to around baseline at 1.54. Baseline Scr 1.5-1.7. Continue encourage good oral hydration. - Trend BMP - Avoid nephrotoxic drugs  Diet: regular IVF:  None VTE: heparin injection 5,000 Units Start: 09/20/23 2200 Code: Full PT/OT: Pending ID:  Anti-infectives (From admission, onward)    Start     Dose/Rate Route Frequency Ordered Stop   09/22/23 0000  cefTRIAXone (ROCEPHIN) IVPB        2 g Intravenous Every 24 hours 09/22/23 1544 11/01/23 2359   09/22/23 0000  daptomycin (CUBICIN) IVPB        700 mg Intravenous Every 24 hours 09/22/23 1544 11/01/23 2359   09/22/23 0000  metroNIDAZOLE (FLAGYL) 500 MG tablet        500 mg Oral 2 times daily 09/22/23 1544 11/01/23 2359   09/20/23 2200  metroNIDAZOLE (FLAGYL) tablet 500 mg        500 mg Oral Every 12 hours 09/20/23 1600     09/20/23 1700  DAPTOmycin (CUBICIN) IVPB 700 mg/159mL premix        8 mg/kg  93 kg 200 mL/hr over 30 Minutes Intravenous Daily 09/20/23 1600     09/20/23 1700  cefTRIAXone (ROCEPHIN) 2 g in sodium chloride 0.9 % 100 mL IVPB        2 g 200 mL/hr over 30 Minutes Intravenous Every 24 hours 09/20/23 1600     09/19/23 1130  piperacillin-tazobactam (ZOSYN) IVPB 3.375 g  Status:  Discontinued        3.375 g 12.5 mL/hr over 240 Minutes Intravenous Every 8 hours 09/19/23 1103 09/19/23 1331   09/19/23 0745  ceFEPIme (MAXIPIME) 2 g in sodium chloride 0.9 % 100 mL IVPB  Status:  Discontinued        2 g 200 mL/hr over 30 Minutes Intravenous  Once 09/19/23 0743 09/19/23 0920   09/19/23 0745  vancomycin (VANCOCIN) IVPB 1000 mg/200 mL premix  Status:  Discontinued       Placed in "And" Linked Group   1,000 mg 200 mL/hr over 60  Minutes Intravenous  Once 09/19/23 0743 09/19/23 0920   09/19/23 0745  vancomycin (VANCOCIN) IVPB 1000 mg/200 mL premix       Placed in "And" Linked  Group   1,000 mg 200 mL/hr over 60 Minutes Intravenous  Once 09/19/23 0743 09/19/23 0928       Anticipated discharge to Home pending medical stabilization.  Rana Snare, DO Internal Medicine Resident PGY-2 Pager: [phone] Please contact the on-call pager after 5 pm and on weekends at [phone].

## 2023-09-25 NOTE — Progress Notes (Addendum)
[facility] Progress Note  CC:  Perianal fistulizing Crohn's disease complicated by presacral fluid collection and history of coccygeal osteomyelitis   Subjective:  No new complaints.  Only complaint is of pain in his bottom.  Hydrocortisone suppositories with lidocaine do help some.  Objective:  Vital signs in last 24 hours: Temp:  [98.2 F (36.8 C)-98.8 F (37.1 C)] 98.2 F (36.8 C) (02/02 0530) Pulse Rate:  [80-81] 81 (02/01 2028) Resp:  [16-20] 20 (02/02 0530) BP: (106-115)/(66-74) 106/74 (02/02 0530) SpO2:  [99 %-100 %] 100 % (02/02 0530) Last BM Date : 09/21/23 (as per patient) General:  Alert, Well-developed, in NAD Heart:  Regular rate and rhythm; no murmurs Pulm: Clear to auscultation bilaterally. Abdomen:  Soft, non-distended.  Bowel sounds present.  Nontender. Extremities:  Without edema. Neurologic:  Alert and oriented x 4;  grossly normal neurologically. Psych:  Alert and cooperative. Normal mood and affect.  Intake/Output from previous day: 02/01 0701 - 02/02 0700 In: 1680 [P.O.:1680] Out: -   Lab Results: Recent Labs    09/23/23 0633 09/24/23 0550 09/25/23 0804  WBC 12.9* 12.8* 13.3*  HGB 8.4* 8.4* 8.5*  HCT 25.4* 25.3* 25.9*  PLT 595* 610* 600*   BMET Recent Labs    09/23/23 0633 09/24/23 0550  NA 135 136  K 4.8 4.6  CL 104 105  CO2 20* 20*  GLUCOSE 99 112*  BUN 15 16  CREATININE 1.58* 1.54*  CALCIUM 8.5* 8.7*   CT ABDOMEN PELVIS W CONTRAST Result Date: 09/24/2023 CLINICAL DATA:  Back pain, dizziness, Crohn's exacerbation EXAM: CT ABDOMEN AND PELVIS WITH CONTRAST TECHNIQUE: Multidetector CT imaging of the abdomen and pelvis was performed using the standard protocol following bolus administration of intravenous contrast. RADIATION DOSE REDUCTION: This exam was performed according to the departmental dose-optimization program which includes automated exposure control, adjustment of the mA and/or kV according to patient size  and/or use of iterative reconstruction technique. CONTRAST:  75mL OMNIPAQUE IOHEXOL 350 MG/ML SOLN COMPARISON:  CT pelvis 09/19/2023; MRI pelvis 09/19/2023; CT abdomen and pelvis 08/22/2023 FINDINGS: Lower chest: No acute abnormality. Hepatobiliary: Unremarkable noncontrast appearance of the liver. Distended gallbladder. No radiopaque stone or biliary dilation. Pancreas: Unremarkable. Spleen: Unremarkable. Adrenals/Urinary Tract: Normal adrenal glands. Stable cystic lesions in both kidneys. No follow-up is recommended. No urinary calculi or hydronephrosis. Stomach/Bowel: Normal caliber large and small bowel. Increased moderate colonic stool burden. Stomach and appendix are within normal limits. No bowel wall thickening. Terminal ileum is unremarkable. The Similar appearance of the seton at the gluteal cleft posterior to the anus. Soft tissue thickening and multiple gas containing tracts extending from the gluteal cleft into the adjacent gluteal musculature and left posterior anal sphincter. Presacral soft tissue thickening. Increased gas within the tracts compared to 09/19/2023 likely due to aspiration 09/20/2023. The fluid component is less conspicuous likely due to aspiration and differences in contrast timing compared with 09/19/2023. Vascular/Lymphatic: No significant vascular findings are present. No enlarged abdominal or pelvic lymph nodes. Reproductive: Unremarkable. Other: Trace free fluid in the pelvis. Nonspecific stranding in the left anterior abdominal wall possibly related to injections. Small fat containing right inguinal hernia. No free intraperitoneal air. Musculoskeletal: No acute fracture. IMPRESSION: 1. Persistent left-sided anorectal fistula with gas containing tracts extending into the presacral space and bilateral gluteal musculature. Increased gas and decreased food within the tracts compared with 09/19/2023 likely secondary to aspiration performed 09/20/2023. Otherwise these are grossly  similar to 09/19/2023 given differences in contrast timing. 2. Increased,  moderate colonic stool burden. Correlate for constipation. No evidence of obstruction. Electronically Signed   By: Minerva Fester M.D.   On: 09/24/2023 00:59   [place] EKG SITE RITE Result Date: 09/23/2023 If Site Rite image not attached, placement could not be confirmed due to current cardiac rhythm.  Assessment / Plan: *Perianal fistulizing Crohn's disease diagnosed in 2021 complicated by recurrent perianal abscesses status post EUA with seton placement, cough PT will osteomyelitis status post modified Han[place] procedure and CABG ectomy.  Now admitted with recurrent sacral abscesses and concern for possible osteomyelitis.  Due to ongoing rectal pain a CT Abd/pelvis was obtained on 2/1 and showed persistent left-sided anorectal fistula with gas containing tracts extending into the presacral space and bilateral gluteal musculature. Increased gas and decreased food within the tracts compared with 09/19/2023 likely secondary to aspiration performed 09/20/2023. Otherwise these are grossly similar to 09/19/2023 given differences in contrast timing.   Per IR note on 2/1:  After review of procedure request by IR Attending Dr. Richarda Overlie area in question looks to be like chronic thickening with air and is therefore not amenable to drainage.  -Will eventually need reintroduction of Stelara, but clearly still being treated for infection so no plans to resume that here as an inpatient at this time. -Continue current antibiotic regimen per ID's recommendations. -Continue to trend CRPs, which is down slightly today at 21.2 although overall remains quite elevated. -Hydrocortisone suppositories with lidocaine jelly at bedtime. -Patient follow-up scheduled Dr. Doy Hutching for ongoing IBD maintenance on 10/11/2023 so should keep that appointment that is scheduled 16 days from now.   LOS: 6 days   Princella Pellegrini. Zehr  09/25/2023, 10:49 AM  I have taken an  interval history, thoroughly reviewed the chart and examined the patient. I agree with the Advanced Practitioner's note, impression and recommendations, and have recorded additional findings, impressions and recommendations below. I performed a substantive portion of this encounter (>50% time spent), including a complete performance of the medical decision making.  My additional thoughts are as follows:  Agree with above, and Dr. Doy Hutching will also return to the inpatient consult service tomorrow.  However, I do not anticipate that she will recommend changes in the current management nor the immediate resumption of Stelara.   Charlie Pitter III Office:[phone]

## 2023-09-26 ENCOUNTER — Encounter: Payer: Self-pay | Admitting: Pediatrics

## 2023-09-26 ENCOUNTER — Other Ambulatory Visit (HOSPITAL_COMMUNITY): Payer: Self-pay

## 2023-09-26 DIAGNOSIS — D649 Anemia, unspecified: Secondary | ICD-10-CM | POA: Diagnosis not present

## 2023-09-26 DIAGNOSIS — K50113 Crohn's disease of large intestine with fistula: Secondary | ICD-10-CM | POA: Diagnosis not present

## 2023-09-26 DIAGNOSIS — E538 Deficiency of other specified B group vitamins: Secondary | ICD-10-CM | POA: Diagnosis not present

## 2023-09-26 DIAGNOSIS — K6289 Other specified diseases of anus and rectum: Secondary | ICD-10-CM | POA: Diagnosis not present

## 2023-09-26 DIAGNOSIS — K604 Rectal fistula, unspecified: Secondary | ICD-10-CM | POA: Diagnosis not present

## 2023-09-26 LAB — CBC
HCT: 26.1 % — ABNORMAL LOW (ref 39.0–52.0)
Hemoglobin: 8.7 g/dL — ABNORMAL LOW (ref 13.0–17.0)
MCH: 28.1 pg (ref 26.0–34.0)
MCHC: 33.3 g/dL (ref 30.0–36.0)
MCV: 84.2 fL (ref 80.0–100.0)
Platelets: 676 10*3/uL — ABNORMAL HIGH (ref 150–400)
RBC: 3.1 MIL/uL — ABNORMAL LOW (ref 4.22–5.81)
RDW: 17.4 % — ABNORMAL HIGH (ref 11.5–15.5)
WBC: 13 10*3/uL — ABNORMAL HIGH (ref 4.0–10.5)
nRBC: 0 % (ref 0.0–0.2)

## 2023-09-26 LAB — C-REACTIVE PROTEIN: CRP: 12.9 mg/dL — ABNORMAL HIGH (ref <1.0)

## 2023-09-26 MED ORDER — LIDOCAINE HCL URETHRAL/MUCOSAL 2 % EX GEL
1.0000 | Freq: Every day | CUTANEOUS | 0 refills | Status: DC
  Filled 2023-09-26: qty 30, 30d supply, fill #0

## 2023-09-26 MED ORDER — OXYCODONE HCL 5 MG PO TABS
5.0000 mg | ORAL_TABLET | Freq: Three times a day (TID) | ORAL | 0 refills | Status: DC | PRN
  Filled 2023-09-26: qty 9, 3d supply, fill #0

## 2023-09-26 MED ORDER — HEPARIN SOD (PORK) LOCK FLUSH 100 UNIT/ML IV SOLN
250.0000 [IU] | INTRAVENOUS | Status: AC | PRN
  Administered 2023-09-26: 250 [IU]

## 2023-09-26 MED ORDER — CYANOCOBALAMIN 1000 MCG/ML IJ SOLN
1000.0000 ug | INTRAMUSCULAR | 0 refills | Status: AC
  Filled 2023-09-26: qty 3, 21d supply, fill #0

## 2023-09-26 MED ORDER — OXYCODONE HCL 10 MG PO TABS
10.0000 mg | ORAL_TABLET | Freq: Three times a day (TID) | ORAL | 0 refills | Status: AC | PRN
  Filled 2023-09-26: qty 12, 4d supply, fill #0

## 2023-09-26 MED ORDER — SENNA 8.6 MG PO TABS
2.0000 | ORAL_TABLET | Freq: Every day | ORAL | 0 refills | Status: DC
  Filled 2023-09-26: qty 120, 60d supply, fill #0

## 2023-09-26 MED ORDER — HYDROCORTISONE ACETATE 25 MG RE SUPP
25.0000 mg | Freq: Every day | RECTAL | 0 refills | Status: DC
  Filled 2023-09-26: qty 12, 12d supply, fill #0

## 2023-09-26 NOTE — Plan of Care (Signed)
  Problem: Education: Goal: Knowledge of General Education information will improve Description: Including pain rating scale, medication(s)/side effects and non-pharmacologic comfort measures Outcome: Progressing   Problem: Clinical Measurements: Goal: Will remain free from infection Outcome: Progressing   Problem: Clinical Measurements: Goal: Cardiovascular complication will be avoided Outcome: Progressing   Problem: Nutrition: Goal: Adequate nutrition will be maintained Outcome: Progressing   Problem: Activity: Goal: Risk for activity intolerance will decrease Outcome: Progressing   Problem: Pain Managment: Goal: General experience of comfort will improve and/or be controlled Outcome: Progressing

## 2023-09-26 NOTE — Progress Notes (Signed)
Daily Progress Note  DOA: 09/18/2023 Hospital Day: 9   Chief Complaint: Crohn's disease  ASSESSMENT    Brief Narrative:  Shawn [name] is a 38 y.o. year old male with a history of dilated cardiomyopathy, HTN, arthritis perianal Crohn's disease.  Admitted 1/27 with pre-sacral abbesssuspected   Perianal fistulizing Crohn's disease  History of recurrent perianal abscesses status post drainage of high anal fistula with seton placement  and coccygectomy ( suspected coccygeal osteomyelitis) in Nov 2022. Had been doing well on Stelara but then self-discontinued it over the summer because he though Crohn's was I remission and no longer requiring treatment. Now admitted. With perineal pain and imaging showing  intact seton with fistula at the left side of the rectum and diffuse wall thickening of the anorectal junction down to the anus.  In conjunction with this there is a 7.3 x 4.0 x 1.5 cm presacral fluid collection.  Notation of increased marrow T2 hyperintensity suspicious for early osteomyelitis.  Today: CRP better 21.2 >> 12.9   Chronic [place] anemia.  Hgb stable this admission at 8.7.  Probably anemia of chronic disease though B12 is chronically low too  Vitamin B12 deficiency.  B12 98. On oral supplementation at home   Principal Problem:   Crohn's proctitis, with fistula (HCC) Active Problems:   Rectal pain   Crohn's disease of small intestine (HCC)   Pelvic abscess in male [facility])   High anal fistula   CKD (chronic kidney disease) stage 3, GFR 30-59 ml/min (HCC)   Osteomyelitis of sacrum (HCC)   Normochromic normocytic anemia   PLAN   --He refused hydrocortisone suppositories and lidocaine last night but we recommend he continue them --Needs B12 replacement --We will eventually resume Stelara after resolution of pelvic abscess / osteomyelitis. Quantiferon Gold in process. HBV core ab / HBV surface ab are negative. negative.  --Patient is scheduled to see Dr. Doy Hutching in  the office on 10/10/22.    Subjective   Pain in bottom, says Dilaudid isn't helpful. Had documented BM yesterday and says he had one this am. No blood in stool   Objective  Abscess aspirate 09/20/23  MODERATE WBC PRESENT,BOTH PMN AND MONONUCLEAR NO ORGANISMS SEEN    Recent Labs    09/24/23 0550 09/25/23 0804 09/26/23 0921  WBC 12.8* 13.3* 13.0*  HGB 8.4* 8.5* 8.7*  HCT 25.3* 25.9* 26.1*  PLT 610* 600* 676*    TIBC148, 13% iron sat Ferritin 530   BMET Recent Labs    09/24/23 0550  NA 136  K 4.6  CL 105  CO2 20*  GLUCOSE 112*  BUN 16  CREATININE 1.54*  CALCIUM 8.7*   LFT No results for input(s): "PROT", "ALBUMIN", "AST", "ALT", "ALKPHOS", "BILITOT", "BILIDIR", "IBILI" in the last 72 hours. PT/INR No results for input(s): "LABPROT", "INR" in the last 72 hours.   Imaging:  CT ABDOMEN PELVIS W CONTRAST CLINICAL DATA:  Back pain, dizziness, Crohn's exacerbation  EXAM: CT ABDOMEN AND PELVIS WITH CONTRAST  TECHNIQUE: Multidetector CT imaging of the abdomen and pelvis was performed using the standard protocol following bolus administration of intravenous contrast.  RADIATION DOSE REDUCTION: This exam was performed according to the departmental dose-optimization program which includes automated exposure control, adjustment of the mA and/or kV according to patient size and/or use of iterative reconstruction technique.  CONTRAST:  75mL OMNIPAQUE IOHEXOL 350 MG/ML SOLN  COMPARISON:  CT pelvis 09/19/2023; MRI pelvis 09/19/2023; CT abdomen and pelvis 08/22/2023  FINDINGS: Lower chest: No acute abnormality.  Hepatobiliary: Unremarkable noncontrast appearance of the liver. Distended gallbladder. No radiopaque stone or biliary dilation.  Pancreas: Unremarkable.  Spleen: Unremarkable.  Adrenals/Urinary Tract: Normal adrenal glands. Stable cystic lesions in both kidneys. No follow-up is recommended. No urinary calculi or hydronephrosis.  Stomach/Bowel:  Normal caliber large and small bowel. Increased moderate colonic stool burden. Stomach and appendix are within normal limits. No bowel wall thickening. Terminal ileum is unremarkable. The  Similar appearance of the seton at the gluteal cleft posterior to the anus. Soft tissue thickening and multiple gas containing tracts extending from the gluteal cleft into the adjacent gluteal musculature and left posterior anal sphincter. Presacral soft tissue thickening. Increased gas within the tracts compared to 09/19/2023 likely due to aspiration 09/20/2023. The fluid component is less conspicuous likely due to aspiration and differences in contrast timing compared with 09/19/2023.  Vascular/Lymphatic: No significant vascular findings are present. No enlarged abdominal or pelvic lymph nodes.  Reproductive: Unremarkable.  Other: Trace free fluid in the pelvis. Nonspecific stranding in the left anterior abdominal wall possibly related to injections. Small fat containing right inguinal hernia. No free intraperitoneal air.  Musculoskeletal: No acute fracture.  IMPRESSION: 1. Persistent left-sided anorectal fistula with gas containing tracts extending into the presacral space and bilateral gluteal musculature. Increased gas and decreased food within the tracts compared with 09/19/2023 likely secondary to aspiration performed 09/20/2023. Otherwise these are grossly similar to 09/19/2023 given differences in contrast timing. 2. Increased, moderate colonic stool burden. Correlate for constipation. No evidence of obstruction.  Electronically Signed   By: Minerva Fester M.D.   On: 09/24/2023 00:59     Scheduled inpatient medications:   acetaminophen  1,000 mg Oral Q6H   Chlorhexidine Gluconate Cloth  6 each Topical Daily   cyanocobalamin  1,000 mcg Intramuscular Weekly   heparin  5,000 Units Subcutaneous Q8H   hydrocortisone  25 mg Rectal QHS   And   lidocaine  1 Application Topical QHS    lidocaine-EPINEPHrine  20 mL Intradermal Once   metroNIDAZOLE  500 mg Oral Q12H   polycarbophil  625 mg Oral BID   polyethylene glycol  17 g Oral BID   senna  2 tablet Oral Daily   sodium chloride flush  10-40 mL Intracatheter Q12H   Continuous inpatient infusions:   cefTRIAXone (ROCEPHIN)  IV 2 g (09/25/23 1806)   DAPTOmycin 700 mg (09/25/23 1500)   PRN inpatient medications: alum & mag hydroxide-simeth, diazepam, diphenhydrAMINE, gadobutrol, HYDROmorphone (DILAUDID) injection, HYDROmorphone, magic mouthwash, menthol-cetylpyridinium, methocarbamol, naphazoline-glycerin, phenol, simethicone, sodium chloride, sodium chloride flush  Vital signs in last 24 hours: Temp:  [97.8 F (36.6 C)-98.7 F (37.1 C)] 98.2 F (36.8 C) (02/03 0844) Pulse Rate:  [62-83] 81 (02/03 0844) Resp:  [16-17] 17 (02/03 0844) BP: (103-118)/(67-81) 113/81 (02/03 0844) SpO2:  [98 %-100 %] 100 % (02/03 0844) Last BM Date : 09/25/23  Intake/Output Summary (Last 24 hours) at 09/26/2023 1223 Last data filed at 09/25/2023 1700 Gross per 24 hour  Intake 480 ml  Output --  Net 480 ml    Intake/Output from previous day: 02/02 0701 - 02/03 0700 In: 840 [P.O.:840] Out: -  Intake/Output this shift: No intake/output data recorded.   Physical Exam:  General: Alert male in NAD Heart:  Regular rate and rhythm.  Pulmonary: Normal respiratory effort Abdomen: Soft, nondistended, nontender. Normal bowel sounds. Extremities: No lower extremity edema  Neurologic: Alert and oriented Psych: Pleasant. Cooperative..      LOS: 7 days   Willette Cluster ,NP 09/26/2023,  12:23 PM

## 2023-09-26 NOTE — TOC Progression Note (Signed)
Transition of Care (TOC) - Progression Note   Attending team secure chatting NCM for discharge today.   Spoke to [employer] with [employer], they are set for discharge today. [employer] is good with discharge today . He needs an extension added to his line before discharge so he can self dose . He will need todays doses given here at the hospital prior to discharge.   Patient, Attending Team and bedside nurse aware  Patient Details  Name: Shawn [name] MRN: [id number] Date of Birth: 1986-03-29  Transition of Care [facility]) CM/SW Contact  Omere Marti, Adria Devon, RN Phone Number: 09/26/2023, 2:58 PM  Clinical Narrative:       Expected Discharge Plan:  (await treatment plan) Barriers to Discharge: Continued Medical Work up  Expected Discharge Plan and Services   Discharge Planning Services: CM Consult   Living arrangements for the past 2 months: Single Family Home                                       Social Determinants of Health (SDOH) Interventions SDOH Screenings   Food Insecurity: No Food Insecurity (09/19/2023)  Housing: Low Risk  (09/19/2023)  Transportation Needs: No Transportation Needs (09/19/2023)  Utilities: Not At Risk (09/19/2023)  Alcohol Screen: Low Risk  (09/18/2023)  Depression (PHQ2-9): Medium Risk (01/05/2023)  Financial Resource Strain: Low Risk  (09/18/2023)  Physical Activity: Sufficiently Active (09/18/2023)  Social Connections: Socially Isolated (09/18/2023)  Stress: Stress Concern Present (09/18/2023)  Tobacco Use: Medium Risk (09/18/2023)    Readmission Risk Interventions     No data to display

## 2023-09-26 NOTE — Plan of Care (Signed)

## 2023-09-26 NOTE — Discharge Summary (Addendum)
Name: Shawn [name] MRN: [id number] DOB: 1986/07/22 38 y.o. PCP: Hilbert Bible, FNP  Date of Admission: 09/18/2023  4:53 PM Date of Discharge: 09/26/23 Attending Physician: Dr. Antony Contras  Discharge Diagnosis: Principal Problem:   Crohn's proctitis, with fistula (HCC) Active Problems:   Rectal pain   Crohn's disease of small intestine (HCC)   Pelvic abscess in male [facility])   High anal fistula   CKD (chronic kidney disease) stage 3, GFR 30-59 ml/min (HCC)   Osteomyelitis of sacrum (HCC)   Normochromic normocytic anemia    Discharge Medications: Allergies as of 09/26/2023       Reactions   Shrimp [shellfish Allergy] Shortness Of Breath   Nsaids Other (See Comments)   CROHN'S DISEASE = NO NSAIDS        Medication List     PAUSE taking these medications    Stelara 90 MG/ML Sosy injection Wait to take this until your doctor or other care provider tells you to start again. Generic drug: ustekinumab Inject 1 mL (90 mg total) into the skin every 8 (eight) weeks. no further refills unless appt is kept       STOP taking these medications    cyanocobalamin 1000 MCG tablet Replaced by: cyanocobalamin 1000 MCG/ML injection   oxyCODONE-acetaminophen 5-325 MG tablet Commonly known as: PERCOCET/ROXICET       TAKE these medications    acetaminophen 500 MG tablet Commonly known as: TYLENOL Take 2 tablets (1,000 mg total) by mouth every 8 (eight) hours as needed for moderate pain.   cefTRIAXone IVPB Commonly known as: ROCEPHIN Inject 2 g into the vein daily. Indication:  Osteomyelitis First Dose: Yes Last Day of Therapy:  11/01/23 Labs - Once weekly:  CBC/D and BMP, Labs - Once weekly: ESR and CRP Method of administration: IV Push Method of administration may be changed at the discretion of home infusion pharmacist based upon assessment of the patient and/or caregiver's ability to self-administer the medication ordered.   cyanocobalamin 1000 MCG/ML  injection Commonly known as: VITAMIN B12 Inject 1 mL (1,000 mcg total) into the muscle once a week for 3 doses. Start taking on: September 29, 2023 Replaces: cyanocobalamin 1000 MCG tablet   daptomycin IVPB Commonly known as: CUBICIN Inject 700 mg into the vein daily. Indication:  Osteomyelitis First Dose: Yes Last Day of Therapy:  11/01/23 Labs - Once weekly:  CBC/D, BMP, and CPK Labs - Once weekly: ESR and CRP Method of administration: IV Push Method of administration may be changed at the discretion of home infusion pharmacist based upon assessment of the patient and/or caregiver's ability to self-administer the medication ordered.   hydrocortisone 25 MG suppository Commonly known as: ANUSOL-HC Unwrap and place 1 suppository (25 mg total) rectally at bedtime.   lidocaine 2 % jelly Commonly known as: XYLOCAINE Apply 1 Application topically at bedtime. APPLY TO HYDROCORTISONE SUPPOSITORY FOR RECTAL PAIN.   metroNIDAZOLE 500 MG tablet Commonly known as: Flagyl Take 1 tablet (500 mg total) by mouth 2 (two) times daily.   Oxycodone HCl 10 MG Tabs Take 1 tablet (10 mg total) by mouth every 8 (eight) hours as needed for up to 4 days for severe pain (pain score 7-10) or breakthrough pain.   senna 8.6 MG Tabs tablet Commonly known as: SENOKOT Take 2 tablets (17.2 mg total) by mouth daily. Start taking on: September 27, 2023               Discharge Care Instructions  (From admission, onward)  Start     Ordered   09/26/23 0000  No dressing needed        09/26/23 1744   09/22/23 0000  Change dressing on IV access line weekly and PRN  (Home infusion instructions - [facility] )        09/22/23 1544            Disposition and follow-up:   Shawn [name] was discharged from [facility] in Stable condition.  At the hospital follow up visit please address:  1.  Follow-up:  a. Sacral osteomyelitis: ensure completion of abx of  CTX/dapto/flagyl, ensure follow up with ID, assess PICC line    b. Crohn's: ensure GI follow up and symptoms improvement, has hydrocortisone suppositories w lidocaine gel if needed   c. Vitamin B12 deficiency: weekly IM B12 injections, follow up with PCP on further injections and frequency   2.  Labs / imaging needed at time of follow-up: CBC, CMP, CRP  3.  Pending labs/ test needing follow-up: QuantiFERON Gold pending  4.  Medication Changes  -Daptomycin 700 mg daily (EOT 11/01/23)  -Ceftriaxone 2 g daily (EOT 11/01/23)  -Flagyl 500 mg BID (EOT 11/01/23)  -B12 1000 mcg injection weekly x 4   -hydrocortisone 25 mg suppository w/ lidocaine jelly at bedtime   -Oxycodone 10 mg Q8H PRN (severe pain) x 4 days   -Senna 2 tablets daily    Follow-up Appointments:  Follow-up Information     Karie Soda, MD Follow up.   Specialties: General Surgery, Colon and Rectal Surgery Why: Please call to arrange a follow up appointment after you have seen Gastroenterology Contact information: [address] [place] [address] [phone]         [facility] Follow up.   Why: [phone]        Caudle, Shelton Silvas, FNP. Go on 10/07/2023.   Specialty: Family Medicine Why: Go to your appt on: 10/07/2023 10:15 AM Contact information: [address] [place] [address] [phone]         Ottie Glazier, MD. Go on 10/11/2023.   Specialty: Gastroenterology Why: Go to your appt on: 10/11/2023 11:10 AM Contact information: [address] [place] [address] [phone]         Gardiner Barefoot, MD. Go on 10/05/2023.   Specialty: Infectious Diseases Why: Go to your appt on: 10/05/2023 3:30 PM Contact information: [address] [place] [address] [phone]                 Hospital Course by problem list: Shawn [name] is a 38 y.o. M with PMH Crohn's colitis with anorectal fistula, prior coccyx  osteomyelitis, macrocytic anemia, CKD stage 3a, who presented with 1 month of low back pain admitted for presacral and left gluteal abscesses and sacral osteomyelitis.   # Osteomyelitis of sacrum # Left gluteal abscess  # Presacral abscess  # Hx of coccyx osteomyelitis # Crohn's disease with anorectal fistula # Chron's proctitis  He presented with worsening perianal pain with drainage. He had low grade fever, leukocytosis, and elevated inflammatory markers. Imaging with CT and MRI showed a deep gluteal abscess and concern for sacral osteomyelitis. We consulted ID/IR/GI. Started on daptomycin, CTX, metronidazole. IR drained gluteal abscess. Cx from the IR aspirate with no growth. Repeat CT A/P did not show further drainage collections after discussion with IR team. Plan to continue prolonged IV abx therapy with EOT  11/01/23. Has ID outpt f/u.   Notably, he was previously on Stelara injections that helped control his chron's disease throughout spring and summer 2024. He discontinued the medication of his own volition as he was under the impression that his Crohn's disease was in remission and he no longer required any treatment.  Given the complicated abscess, he will need to restart stelara outpatient with GI once current infection improves/resolves. Hepatitis B immunity and pending QuantiFERON gold. Scheduled with Dr. Doy Hutching (GI) on 2/18. Received hydrocortisone suppository w lidocaine jelly. Sent short course of oxycodone 10 mg Q8H PRN for pain with senna daily.  - Outpatient Abx regimen: daptomycin 700 mg daily, ceftriaxone 2 g daily and flagyl 500 mg BID (EOT 11/01/23) - follow up QuantiFERON gold   # Acute on chronic anemia. # History of vit B12 deficiency. Hb 8.8 on arrival and stayed stable with 8.7 at d/c. Baseline Hb 10-11. No signs of acutely bleeding. Iron panel consistent with anemia of chronic disease. Vitamin B12 low with being on oral supplementation, question absorption with Crohn's. He  received IM B12 injection on 1/30 with plans for weekly until PCP follow up on 2/14.   # CKD 3A   SCr 1.8 on arrival, improved to baseline (1.5-1.7) at time of discharge with adequate po hydration.     Discharge Subjective: Patient reports not feeling worse than yesterday. Still has pain but not worsening. His mood appears better today. Has had BM. Discussed discharge plan with IV antibiotics and ensure follow up with PCP, GI, ID which he voices understanding.   Discharge Exam:   BP 118/70 (BP Location: Left Arm)   Pulse 77   Temp 98.6 F (37 C) (Oral)   Resp 17   Ht 6\' 1"  (1.854 m)   Wt 93 kg   SpO2 98%   BMI 27.05 kg/m  Constitutional: alert, male laying in bed, in no acute distress Cardiovascular: regular rate Pulmonary/Chest: normal work of breathing on room air Abdominal: bowel sounds present, soft, mild diffuse tenderness to palpation, no signs of acute abdomen  Neurological: alert & oriented x 3  Pertinent Labs, Studies, and Procedures:     Latest Ref Rng & Units 09/26/2023    9:21 AM 09/25/2023    8:04 AM 09/24/2023    5:50 AM  CBC  WBC 4.0 - 10.5 K/uL 13.0  13.3  12.8   Hemoglobin 13.0 - 17.0 g/dL 8.7  8.5  8.4   Hematocrit 39.0 - 52.0 % 26.1  25.9  25.3   Platelets 150 - 400 K/uL 676  600  610        Latest Ref Rng & Units 09/24/2023    5:50 AM 09/23/2023    6:33 AM 09/22/2023    5:43 AM  CMP  Glucose 70 - 99 mg/dL 865  99  784   BUN 6 - 20 mg/dL 16  15  16    Creatinine 0.61 - 1.24 mg/dL 6.96  2.95  2.84   Sodium 135 - 145 mmol/L 136  135  137   Potassium 3.5 - 5.1 mmol/L 4.6  4.8  4.7   Chloride 98 - 111 mmol/L 105  104  105   CO2 22 - 32 mmol/L 20  20  20    Calcium 8.9 - 10.3 mg/dL 8.7  8.5  8.5     MR PELVIS W WO CONTRAST Addendum Date: 09/21/2023 ADDENDUM REPORT: 09/21/2023 08:55 ADDENDUM: The original report was by Dr. Roxy Horseman. The following addendum is by  Dr. Gaylyn Rong: I received a message from Estill Cotta, radiology assistant, that further  comment on the rectum and concern for proctitis in this patient with Crohn's disease was requested on this exam performed on 09/19/2023. Dr. Purcell Mouton was not available and so this was passed to me. In this context I have reviewed the appearance of the rectum for comment below. However, I have not reinterpreted the entire case or assessed other regions. In addition to the perianal fistula with seton tube in place, there appears to be a fistula extending from the left side of the rectum (image [id number] series 13) at about the 3 o'clock position approximately 5 cm cephalad to the anorectal junction, and extending directly posteriorly into the complex presacral collection. The collection has small abscess like extensions extending along the gluteal musculature. Multiple likely reactive perirectal lymph nodes are present. There is primarily lower rectal diffuse wall thickening extending down to the anorectal junction and probably into the anus. The upper rectum does not appear substantially thickened. If follow up imaging of the perirectal and perianal fistulas becomes indicated, consider dedicated perianal fistula protocol MRI which has some advantages in depicting fistulas in this region compared to standard pelvic MRI. Electronically Signed   By: Gaylyn Rong M.D.   On: 09/21/2023 08:55   Result Date: 09/21/2023 CLINICAL DATA:  Low back pain. History of perianal abscess/fistula. Clinical concern for osteomyelitis. EXAM: MRI PELVIS WITHOUT AND WITH CONTRAST TECHNIQUE: Multiplanar multisequence MR imaging of the pelvis was performed both before and after administration of intravenous contrast. CONTRAST:  9mL GADAVIST GADOBUTROL 1 MMOL/ML IV SOLN COMPARISON:  MRI pelvis 05/16/2021, CT pelvis 08/22/2023 and lumbar MRI 09/19/2023. FINDINGS: Bones/Joint/Cartilage There are chronic perirectal and presacral inflammatory changes which have progressed from previous MRI. Recent CT demonstrated a perianal fistula with seton  tube in place. A presacral fluid collection with a small amount of internal air and heterogeneous surrounding enhancement appears enlarged from the most recent CT, measuring up to 7.3 x 4.0 x 1.5 cm. Appearance is suspicious for a presacral abscess. There is increased marrow T2 hyperintensity and enhancement within the adjacent mid to distal sacrum and coccyx suspicious for early osteomyelitis. No gross cortical destruction identified. The sacroiliac joints appear intact. No significant hip joint effusion or abnormal synovial enhancement. The remainder of the bony pelvis appears intact. Ligaments No significant ligamentous abnormalities are identified. Muscles and Tendons Presacral inflammatory changes extend into the adjacent piriformis and gluteus maximus muscles bilaterally. There are associated small intramuscular fluid collections with peripheral enhancement. The common hamstring and iliopsoas tendons appear unremarkable. Soft tissue As above, enlarging complex presacral fluid collection with peripheral enhancement suspicious for an abscess. There is asymmetric extension of peripherally enhancing fluid into the posterior aspect of the left greater sciatic notch, measuring 2.0 x 1.3 x 1.3 cm. Grossly stable underlying rectal wall thickening. IMPRESSION: 1. Enlarging presacral fluid collection with peripheral enhancement suspicious for an abscess. There is asymmetric extension of peripherally enhancing fluid into the posterior aspect of the left greater sciatic notch. 2. Increased marrow T2 hyperintensity and enhancement within the adjacent mid to distal sacrum and coccyx suspicious for early osteomyelitis. 3. Presacral inflammatory changes extend into the adjacent piriformis and gluteus maximus muscles bilaterally with associated small intramuscular fluid collections. 4. No evidence of sacroiliitis or hip joint septic arthritis. Electronically Signed: By: Carey Bullocks M.D. On: 09/19/2023 08:57   CT PELVIS W  CONTRAST Result Date: 09/19/2023 CLINICAL DATA:  R/O Pelvic abscess assess pre sacral collection for  possible IR drainage EXAM: CT PELVIS WITH CONTRAST TECHNIQUE: Multidetector CT imaging of the pelvis was performed using the standard protocol following the bolus administration of intravenous contrast. RADIATION DOSE REDUCTION: This exam was performed according to the departmental dose-optimization program which includes automated exposure control, adjustment of the mA and/or kV according to patient size and/or use of iterative reconstruction technique. CONTRAST:  75mL OMNIPAQUE IOHEXOL 350 MG/ML SOLN COMPARISON:  MR pelvis, 09/19/2023.  CT AP, 08/22/2023. FINDINGS: Urinary Tract:  Minimal urinary bladder distention. Bowel:  Imaged portions of small bowel and colon are nondilated. Vascular/Lymphatic: No pathologically enlarged lymph nodes. No significant vascular abnormality seen. Reproductive:  Prostate is within normal limits. Other: Stable positioning of drain seton at the anorectal verge. Increased peri-rectal stranding with air and fluid tracking along the LEFT rectal wall, consistent with a persistent fistula-in-ano. See key image. Small intramuscular rim-enhancing air-and fluid collections within the medial and deep margins of the gluteus maximus, largest measuring up to 1.5 cm RIGHT and 3.0 cm LEFT. Additional rim-enhancing air-fluid containing collection along the sacrum, measuring up to 2.0 x 4.0 x 6.0 cm (AP by transaxial by CC). No discrete underlying focal demineralization, erosion or periosteal change at the sacrum or coccyx. Musculoskeletal: Small fat-containing RIGHT inguinal hernia versus cord lipoma. No acute osseous abnormality IMPRESSION: 1. LEFT-sided persistent anorectal fistula with small intramuscular abscesses along the medial and deep margins of the gluteus maximus, as above. 2. Additional small abscess coursing longitudinally along the sacrum. No overt CT evidence of sacrococcygeal  osteomyelitis. Electronically Signed   By: Roanna Banning M.D.   On: 09/19/2023 16:29   MR Lumbar Spine W Wo Contrast Result Date: 09/19/2023 CLINICAL DATA:  38 year old male with back pain radiating down the left leg. Dizziness, shortness of breath. History of perianal abscess. EXAM: MRI LUMBAR SPINE WITHOUT AND WITH CONTRAST TECHNIQUE: Multiplanar and multiecho pulse sequences of the lumbar spine were obtained without and with intravenous contrast. CONTRAST:  9mL GADAVIST GADOBUTROL 1 MMOL/ML IV SOLN COMPARISON:  Thoracic MRI today reported separately. CT Abdomen and Pelvis 08/22/2023. Pelvis MRI today reported separately. FINDINGS: Segmentation:  Normal, concordant with the thoracic numbering today. Alignment: Stable to mildly improved lumbar lordosis compared to the CT last month. Vertebrae: Generalized nonspecific decreased bone marrow signal in the visible spine and pelvis. Stable lumbar vertebral height. No marrow edema identified in the lumbar spine. However, there is patchy abnormal marrow edema and enhancement identified in the lower sacrum beginning at the S3 central vertebral level (series 8, image 8 and series 12, image 8). Upper sacral segments, upper sacral ala and SI joints appear spared. Conus medullaris and cauda equina: Conus better demonstrated on the thoracic MRI today reported separately. Minimally visible on these images. Cauda equina nerve roots appear to remain normal. No lumbar abnormal intrathecal enhancement or dural thickening. Sacral epidural lipomatosis. Paraspinal and other soft tissues: Negative lumbar paraspinal soft tissues. Visible abdominal viscera stable from the CT Abdomen and Pelvis last month including heterogeneous appearance of the kidneys. Partially visible abnormal presacral enhancement and loculated appearing fluid or edema (series 12, image 8 and series 8, image 8). This extends up to the ventral lower S2 sacral level. Disc levels: No age advanced lumbar spine  degeneration or spinal stenosis is identified. IMPRESSION: 1. Evidence of lower Sacral Osteomyelitis with presacral Abscess, Phlegmon: Partially visible abnormal sacrum beginning at the S3 body with marrow edema and enhancement. And abnormal presacral soft tissue fluid, edema, and enhancement. See further details on Pelvis MRI today  reported separately. 2. No acute or inflammatory process identified in the lumbar spine. No lumbar spinal stenosis. Electronically Signed   By: Odessa Fleming M.D.   On: 09/19/2023 06:45   MR THORACIC SPINE W WO CONTRAST Result Date: 09/19/2023 CLINICAL DATA:  38 year old male with back pain radiating down the left leg. Dizziness, shortness of breath. History of perianal abscess. EXAM: MRI THORACIC WITHOUT AND WITH CONTRAST TECHNIQUE: Multiplanar and multiecho pulse sequences of the thoracic spine were obtained without and with intravenous contrast. CONTRAST:  9mL GADAVIST GADOBUTROL 1 MMOL/ML IV SOLN COMPARISON:  CTA chest 04/03/2023. FINDINGS: Limited cervical spine imaging:  Minimally included. Thoracic spine segmentation:  Normal on the comparison CTA. Alignment: Stable thoracic kyphosis compared to last year. No significant scoliosis or spondylolisthesis. Vertebrae: Generalized decreased T1 marrow signal, nonspecific. Bone mineralization within normal limits by CT last year. Stable, generally maintained vertebral body height. No marrow edema or evidence of acute osseous abnormality. No abnormal enhancement identified. Cord: No thoracic spinal cord signal abnormality. Maintained cord volume. Conus medullaris appears normal at T11-T12. No abnormal intradural enhancement. No dural thickening. Paraspinal and other soft tissues: Negative. Negative visualized posterior paraspinal soft tissues. Disc levels: Negative aside from T3-T4: Moderate to severe facet and/or ligament flavum hypertrophy on the right (series 20, image 14). Mild spinal stenosis. Moderate right T3 neural foraminal stenosis.  T4-T5: Moderate similar right ligament flavum hypertrophy. Borderline to mild stenosis. T5-T6: Mild left side ligament flavum hypertrophy (series 20, image 22). No stenosis. T9-T10: Mild to moderate bilateral ligament flavum hypertrophy greater on the left (series 20, image 39). Mild spinal stenosis. Moderate to severe left and mild to moderate right T9 foraminal stenosis. T10-T11: Subtle posterior disc bulging. Mild to moderate facet and/or ligament flavum hypertrophy. Mild spinal stenosis (series 20, image 45). No significant spinal cord mass effect. Mild to moderate bilateral T10 foraminal stenosis. T11-T12: Moderate facet hypertrophy greater on the left. No spinal stenosis but moderate to severe left T11 foraminal stenosis. T12-L1: Mild to moderate left ligament flavum hypertrophy. Moderate to severe left T12 foraminal stenosis. IMPRESSION: 1. No acute or inflammatory abnormality in the Thoracic Spine. 2. Multilevel thoracic spine degeneration chiefly in the form of posterior element hypertrophy. Isolated mild thoracic spinal stenosis at T10-T11 when combined with disc bulging there. No thoracic spinal cord mass effect or signal abnormality. Moderate to severe associated neural foraminal stenosis at the right T3, right T9, left T11 and T12 nerve levels. Electronically Signed   By: Odessa Fleming M.D.   On: 09/19/2023 06:37     Discharge Instructions: Discharge Instructions     [facility] pharmacist to adjust dose for Vancomycin, Aminoglycosides and other anti-infective therapies as requested by physician.   Complete by: As directed    [facility] to provide Cath Flo 2mg    Complete by: As directed    Administer for PICC line occlusion and as ordered by physician for other access device issues.   Anaphylaxis Kit: Provided to treat any anaphylactic reaction to the medication being provided to the patient if First Dose or when requested by physician   Complete by: As directed     Epinephrine 1mg /ml vial / amp: Administer 0.3mg  (0.76ml) subcutaneously once for moderate to severe anaphylaxis, nurse to call physician and pharmacy when reaction occurs and call 911 if needed for immediate care   Diphenhydramine 50mg /ml IV vial: Administer 25-50mg  IV/IM PRN for first dose reaction, rash, itching, mild reaction, nurse to call physician and pharmacy when reaction occurs  Sodium Chloride 0.9% NS IV: Administer if needed for hypovolemic blood pressure drop or as ordered by physician after call to physician with anaphylactic reaction   Call MD for:  redness, tenderness, or signs of infection (pain, swelling, redness, odor or green/yellow discharge around incision site)   Complete by: As directed    Change dressing on IV access line weekly and PRN   Complete by: As directed    Diet - low sodium heart healthy   Complete by: As directed    Discharge instructions   Complete by: As directed    1. Thank you for allowing [place] to be part of your care. You were hospitalized for due to severe rectal pain, infection and abscess, abscess was drained. You were treated with antibiotics. You will need to continue IV antibiotics at home to treat infection.   2. Please follow up with the following providers: A. Primary Care: Hilbert Bible, FNP on 10/07/23 10:50 AM B. GI Doctor: Ottie Glazier, MD on 10/11/2023 11:10 AM  C. Infectious Disease Doctor: Gardiner Barefoot, MD on 10/05/2023 3:30 PM   3. Please note these changes made to your medications:  A. Medications to continue:  -Tylenol 1000 mg every 8 hours as needed for pain B. Medications to start:  -Daptomycin 700 mg into vein daily (End date 11/01/23)  -Ceftriaxone 2 g into vein daily (End date 11/01/23)  -Flagyl 500 mg by mouth twice a day (End date 11/01/23)  -B12 1000 mcg injection weekly  -hydrocortisone 25 mg suppository with lidocaine jelly at bedtime   -Oxycodone 10 mg every 8 hours as needed (severe pain)   -Senna 2  tablets daily   C. Medications to discontinue: None  4. Please make sure to return to the hospital if you have worsening abdominal pain, or worsening rectal drainage.  5. Please make sure to go to your follow up appointments!   Flush IV access with Sodium Chloride 0.9% and Heparin 10 units/ml or 100 units/ml   Complete by: As directed    Home infusion instructions - [facility]   Complete by: As directed    Instructions: Flush IV access with Sodium Chloride 0.9% and Heparin 10units/ml or 100units/ml   Change dressing on IV access line: Weekly and PRN   Instructions Cath Flo 2mg : Administer for PICC Line occlusion and as ordered by physician for other access device   [facility] pharmacist to adjust dose for: Vancomycin, Aminoglycosides and other anti-infective therapies as requested by physician   Increase activity slowly   Complete by: As directed    Method of administration may be changed at the discretion of home infusion pharmacist based upon assessment of the patient and/or caregiver's ability to self-administer the medication ordered   Complete by: As directed    No dressing needed   Complete by: As directed        Signed: Rana Snare, DO 09/26/2023, 6:13 PM   Pager: [phone]

## 2023-09-27 DIAGNOSIS — M4628 Osteomyelitis of vertebra, sacral and sacrococcygeal region: Secondary | ICD-10-CM | POA: Diagnosis not present

## 2023-09-27 LAB — QUANTIFERON-TB GOLD PLUS: QuantiFERON-TB Gold Plus: NEGATIVE

## 2023-09-27 LAB — QUANTIFERON-TB GOLD PLUS (RQFGPL)
QuantiFERON Mitogen Value: 0.86 [IU]/mL
QuantiFERON Nil Value: 0.05 [IU]/mL
QuantiFERON TB1 Ag Value: 0.03 [IU]/mL
QuantiFERON TB2 Ag Value: 0.04 [IU]/mL

## 2023-10-02 DIAGNOSIS — M4628 Osteomyelitis of vertebra, sacral and sacrococcygeal region: Secondary | ICD-10-CM | POA: Diagnosis not present

## 2023-10-04 ENCOUNTER — Other Ambulatory Visit (INDEPENDENT_AMBULATORY_CARE_PROVIDER_SITE_OTHER): Payer: Medicaid Other

## 2023-10-04 ENCOUNTER — Encounter: Payer: Self-pay | Admitting: Pediatrics

## 2023-10-04 DIAGNOSIS — K50019 Crohn's disease of small intestine with unspecified complications: Secondary | ICD-10-CM

## 2023-10-04 DIAGNOSIS — K604 Rectal fistula, unspecified: Secondary | ICD-10-CM

## 2023-10-04 DIAGNOSIS — K50113 Crohn's disease of large intestine with fistula: Secondary | ICD-10-CM

## 2023-10-04 DIAGNOSIS — D649 Anemia, unspecified: Secondary | ICD-10-CM

## 2023-10-04 LAB — CBC WITH DIFFERENTIAL/PLATELET
Basophils Absolute: 0.2 10*3/uL — ABNORMAL HIGH (ref 0.0–0.1)
Basophils Relative: 1.6 % (ref 0.0–3.0)
Eosinophils Absolute: 0.6 10*3/uL (ref 0.0–0.7)
Eosinophils Relative: 5.6 % — ABNORMAL HIGH (ref 0.0–5.0)
HCT: 31.8 % — ABNORMAL LOW (ref 39.0–52.0)
Hemoglobin: 10.4 g/dL — ABNORMAL LOW (ref 13.0–17.0)
Lymphocytes Relative: 22.6 % (ref 12.0–46.0)
Lymphs Abs: 2.4 10*3/uL (ref 0.7–4.0)
MCHC: 32.8 g/dL (ref 30.0–36.0)
MCV: 88.1 fL (ref 78.0–100.0)
Monocytes Absolute: 0.6 10*3/uL (ref 0.1–1.0)
Monocytes Relative: 5.2 % (ref 3.0–12.0)
Neutro Abs: 7 10*3/uL (ref 1.4–7.7)
Neutrophils Relative %: 65 % (ref 43.0–77.0)
Platelets: 570 10*3/uL — ABNORMAL HIGH (ref 150.0–400.0)
RBC: 3.62 Mil/uL — ABNORMAL LOW (ref 4.22–5.81)
RDW: 20.1 % — ABNORMAL HIGH (ref 11.5–15.5)
WBC: 10.8 10*3/uL — ABNORMAL HIGH (ref 4.0–10.5)

## 2023-10-04 LAB — COMPREHENSIVE METABOLIC PANEL
ALT: 52 U/L (ref 0–53)
AST: 30 U/L (ref 0–37)
Albumin: 3.8 g/dL (ref 3.5–5.2)
Alkaline Phosphatase: 119 U/L — ABNORMAL HIGH (ref 39–117)
BUN: 15 mg/dL (ref 6–23)
CO2: 21 meq/L (ref 19–32)
Calcium: 8.9 mg/dL (ref 8.4–10.5)
Chloride: 108 meq/L (ref 96–112)
Creatinine, Ser: 1.48 mg/dL (ref 0.40–1.50)
GFR: 59.29 mL/min — ABNORMAL LOW (ref >60.00)
Glucose, Bld: 73 mg/dL (ref 70–99)
Potassium: 4.5 meq/L (ref 3.5–5.1)
Sodium: 138 meq/L (ref 135–145)
Total Bilirubin: 0.3 mg/dL (ref 0.2–1.2)
Total Protein: 8.9 g/dL — ABNORMAL HIGH (ref 6.0–8.3)

## 2023-10-04 LAB — HIGH SENSITIVITY CRP: CRP, High Sensitivity: 38.4 mg/L — ABNORMAL HIGH (ref 0.000–5.000)

## 2023-10-04 LAB — SEDIMENTATION RATE: Sed Rate: >130 mm/h — ABNORMAL HIGH (ref 0–15)

## 2023-10-04 NOTE — Telephone Encounter (Signed)
Lab orders have been placed.  CT pelvis has been scheduled at [facility] for first available date, Tuesday 10/11/23 at 1 pm. Patient should arrive at 1045 am for registration and to drink oral contrast.  I have left a voicemail for patient to call back.

## 2023-10-05 ENCOUNTER — Encounter: Payer: Self-pay | Admitting: Pediatrics

## 2023-10-05 ENCOUNTER — Inpatient Hospital Stay: Payer: Medicaid Other | Admitting: Internal Medicine

## 2023-10-06 ENCOUNTER — Ambulatory Visit (INDEPENDENT_AMBULATORY_CARE_PROVIDER_SITE_OTHER): Payer: Medicaid Other

## 2023-10-06 ENCOUNTER — Ambulatory Visit (HOSPITAL_BASED_OUTPATIENT_CLINIC_OR_DEPARTMENT_OTHER): Payer: Medicaid Other | Admitting: Family Medicine

## 2023-10-06 DIAGNOSIS — K50019 Crohn's disease of small intestine with unspecified complications: Secondary | ICD-10-CM

## 2023-10-06 DIAGNOSIS — D649 Anemia, unspecified: Secondary | ICD-10-CM

## 2023-10-06 DIAGNOSIS — K604 Rectal fistula, unspecified: Secondary | ICD-10-CM | POA: Diagnosis not present

## 2023-10-06 DIAGNOSIS — K50113 Crohn's disease of large intestine with fistula: Secondary | ICD-10-CM | POA: Diagnosis not present

## 2023-10-06 LAB — C-REACTIVE PROTEIN: CRP: 3.9 mg/dL (ref 0.5–20.0)

## 2023-10-07 ENCOUNTER — Inpatient Hospital Stay (HOSPITAL_BASED_OUTPATIENT_CLINIC_OR_DEPARTMENT_OTHER): Payer: Medicaid Other | Admitting: Family Medicine

## 2023-10-07 DIAGNOSIS — M4628 Osteomyelitis of vertebra, sacral and sacrococcygeal region: Secondary | ICD-10-CM | POA: Diagnosis not present

## 2023-10-08 DIAGNOSIS — M4628 Osteomyelitis of vertebra, sacral and sacrococcygeal region: Secondary | ICD-10-CM | POA: Diagnosis not present

## 2023-10-10 ENCOUNTER — Encounter: Payer: Self-pay | Admitting: Pediatrics

## 2023-10-10 NOTE — Progress Notes (Deleted)
 [facility] Return Visit   Referring Provider Hoy Register, MD [address] [place],  [place] [address]  Primary Care Provider No primary care provider on file.  Patient Profile: Shawn [name] is a 38 y.o. male who returns to the [facility] for follow-up of the problem(s) noted below.  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 Otherwise negative.  General Review of Systems  Review of systems is significant for the pertinent positives and negatives as listed per the HPI.  Full ROS is otherwise negative.  Inflammatory Bowel Disease History  05/2020-coccygeal pain, CT imaging demonstrating perirectal stranding 10/2020-right groin abscess ?  Diagnosis of hidradenitis suppurativa 11/2020-rectal pain, IDA and CT showing rectal wall stranding 12/2020-colonoscopy demonstrating rectal inflammation in the distal, mid and proximal rectum; remainder of colon normal-pathology consistent with chronic colitis with focal activity in the rectum 03/2021 -EUA with incision and drainage of left perianal abscess; diagnosed with coccygeal osteomyelitis around this time 06/2021 -modified Hanley procedure and coccygectomy 07/2021 -completed antibiotic treatment with cefdinir metronidazole; induction infliximab 5 mg/kg with maintenance every 8 weeks 12/2021 - recurrent perianal fistula and abscess 03/2022 - therapeutic drug monitoring demonstrated nil level less than 0.4,  high antibodies 1358 08/2022  - induction Stelara followed by maintenance 90 mg subcutaneously every 8 weeks 03/2023  -reports improved symptoms; colonoscopy showed diminished rectal inflammation now moderate in severity  08/2023 - Admitted [facility] with c/f recurrent perianal abscesses, presacral fluid collection and osteomyelitis  IBD Medication History   Past Medical History   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   PO B12 initiated 12/31/20   Anxiety    Arthritis    Chronic kidney disease    Crohn's colitis (HCC)    Dilated cardiomyopathy (HCC) 05/18/2013   By catheterization 2014    Fistula    GERD (gastroesophageal reflux disease)    Hypertension    Neuromuscular disorder (HCC)    Osteomyelitis (HCC)    tailbone     Past Surgical History   Past Surgical History:  Procedure Laterality Date   BIOPSY  01/01/2021   Procedure: BIOPSY;  Surgeon: Meryl Dare, MD;  Location: [facility] ENDOSCOPY;  Service: Endoscopy;;   COCCYGECTOMY  07/07/2021   COLONOSCOPY WITH PROPOFOL N/A 01/01/2021   Procedure: COLONOSCOPY WITH PROPOFOL;  Surgeon: Meryl Dare, MD;  Location: [facility] ENDOSCOPY;  Service: Endoscopy;  Laterality: N/A;   INCISION AND DRAINAGE ABSCESS N/A 07/07/2021   Procedure: MODIFIED HANLEY PROCEDURE, DRAINAGE WITH SETON PLACEMENT;  Surgeon: Karie Soda, MD;  Location: [facility] ORS;  Service: General;  Laterality: N/A;   INCISION AND DRAINAGE ABSCESS  05/04/2017   right groin   INCISION AND DRAINAGE PERIRECTAL ABSCESS  04/21/2021   INCISION AND DRAINAGE PERIRECTAL ABSCESS  05/18/2013   LEFT AND RIGHT HEART CATHETERIZATION WITH CORONARY ANGIOGRAM N/A 11/21/2012   Procedure: LEFT AND RIGHT HEART CATHETERIZATION WITH CORONARY ANGIOGRAM;  Surgeon: Ricki Rodriguez, MD;  Location: [facility] CATH LAB;  Service: Cardiovascular;  Laterality: N/A;   MANDIBLE FRACTURE SURGERY   08/23/2004   PLACEMENT OF SETON N/A 07/07/2021   Procedure: PLACEMENT OF SETON;  Surgeon: Karie Soda, MD;  Location: [facility] ORS;  Service: General;  Laterality: N/A;   PLACEMENT OF SETON N/A 01/15/2022   Procedure: PLACEMENT OF SETON;  Surgeon: Karie Soda, MD;  Location: [facility] ORS;  Service: General;  Laterality: N/A;   RECTAL EXAM UNDER ANESTHESIA N/A 01/15/2022   Procedure: RECTAL EXAM UNDER ANESTHESIA;  Surgeon: Karie Soda, MD;  Location: [facility] ORS;  Service: General;  Laterality: N/A;  TRANSRECTAL DRAINAGE REPLACEMENT OF SETON EXCISION OF PERINEAL SINUS TRACT ANORECTAL EXAM UNDER ANESTHESIA     Allergies and Medications   Allergies  Allergen Reactions   Shrimp [Shellfish Allergy] Shortness Of Breath   Nsaids Other (See Comments)    CROHN'S DISEASE = NO NSAIDS    @MEDSTODAY @  Family History   Family History  Problem Relation Age of Onset   Diabetes Mother    Kidney failure Mother    Diabetes Sister    Colon cancer Neg Hx    Stomach cancer Neg Hx    Esophageal cancer Neg Hx    Pancreatic disease Neg Hx    Colon polyps Neg Hx    Rectal cancer Neg Hx    GI Specific Family History: {gifamhx:50061}   Social History   Social History   Tobacco Use   Smoking status: Former    Current packs/day: 0.25    Types: Cigarettes   Smokeless tobacco: Never  Vaping Use   Vaping status: Never Used  Substance Use Topics   Alcohol use: Not Currently    Comment: occ   Drug use: No   Shawn [name] reports that he has quit smoking. His smoking use included cigarettes. He has never used smokeless tobacco. He reports that he does not currently use alcohol. He reports that he does not use drugs.  Vital Signs and Physical Examination  There were no vitals filed for this visit. There is no height or weight on file to calculate BMI.    General: Well developed, well nourished, no acute distress Head: Normocephalic and atraumatic Eyes: Sclerae anicteric, EOMI Ears: Normal auditory  acuity Mouth: No deformities or lesions noted Lungs: Clear throughout to auscultation Heart: Regular rate and rhythm; No murmurs, rubs or bruits Abdomen: Soft, non tender and non distended. No masses, hepatosplenomegaly or hernias noted. Normal Bowel sounds Rectal: Musculoskeletal: Symmetrical with no gross deformities  Pulses:  Normal pulses noted Extremities: No edema or deformities noted Neurological: Alert oriented x 4, grossly nonfocal Psychological:  Alert and cooperative. Normal mood and affect   Review of Data  The following data was reviewed at the time of this encounter:  Laboratory Studies      Latest Ref Rng & Units 10/04/2023    3:00 PM 09/26/2023    9:21 AM 09/25/2023    8:04 AM  CBC  WBC 4.0 - 10.5 K/uL 10.8  13.0  13.3   Hemoglobin 13.0 - 17.0 g/dL 78.2  8.7  8.5   Hematocrit 39.0 - 52.0 % 31.8  26.1  25.9   Platelets 150.0 - 400.0 K/uL 570.0  676  600     Lab Results  Component Value Date   LIPASE 35 06/04/2020      Latest Ref Rng & Units 10/04/2023    3:00 PM 09/24/2023    5:50 AM 09/23/2023    6:33 AM  CMP  Glucose 70 - 99 mg/dL 73  981  99   BUN 6 - 23 mg/dL 15  16  15    Creatinine 0.40 - 1.50 mg/dL 1.91  4.78  2.95   Sodium 135 - 145 mEq/L 138  136  135   Potassium 3.5 - 5.1 mEq/L 4.5  4.6  4.8   Chloride 96 - 112 mEq/L 108  105  104   CO2 19 - 32 mEq/L 21  20  20    Calcium 8.4 - 10.5 mg/dL 8.9  8.7  8.5   Total Protein 6.0 - 8.3 g/dL 8.9     Total Bilirubin 0.2 - 1.2 mg/dL 0.3     Alkaline Phos 39 - 117 U/L 119     AST 0 - 37 U/L 30     ALT 0 - 53 U/L 52        Imaging Studies  MRI pelvis 09/19/2023 IMPRESSION: 1. Enlarging presacral fluid collection with peripheral enhancement suspicious for an abscess. There is asymmetric extension of peripherally enhancing fluid into the posterior aspect of the left greater sciatic notch. 2. Increased marrow T2 hyperintensity and enhancement within the adjacent mid to distal sacrum and coccyx suspicious  for early osteomyelitis. 3. Presacral inflammatory changes extend into the adjacent piriformis and gluteus maximus muscles bilaterally with associated small intramuscular fluid collections. 4. No evidence of sacroiliitis or hip joint septic arthritis. ADDENDUM: The original report was by Dr. Roxy Horseman. The following addendum is by Dr. Gaylyn Rong:   I received a message from Estill Cotta, radiology assistant, that further comment on the rectum and concern for proctitis in this patient with Crohn's disease was requested on this exam performed on 09/19/2023. Dr. Purcell Mouton was not available and so this was passed to me.   In this context I have reviewed the appearance of the rectum for comment below. However, I have not reinterpreted the entire case or assessed other regions.   In addition to the perianal fistula with seton tube in place, there appears to be a fistula extending from the left side of the rectum (image [id number] series 13) at about the 3 o'clock position approximately 5 cm cephalad to the anorectal junction, and extending directly posteriorly into the complex presacral collection. The collection has small abscess like extensions extending along the gluteal musculature. Multiple likely reactive perirectal lymph nodes are present.   There is primarily lower rectal diffuse wall thickening extending down to the anorectal junction and probably into the anus. The upper rectum does not appear substantially thickened.   If follow up imaging of the perirectal and perianal fistulas becomes indicated, consider dedicated perianal fistula protocol MRI which has some advantages in depicting fistulas in this region compared to standard pelvic MRI.  GI Procedures and Studies  Colonoscopy 04/06/2023 - Abnormal perianal exam fistula with seton in place.  - The examined portion of the ileum was normal. Biopsied. - Crohn's disease with colonic involvement. Inflammation was found from  the anus to the rectum. This was moderate in severity, improved compared to previous examinations. Biopsied.  - The examination was otherwise normal on direct and retroflexion views.   Path 1. Surgical [P], small bowel, ileum ILEAL MUCOSA WITH PRESERVED VILLOGLANDULAR ARCHITECTURE WITHOUT INCREASED INTRAEPITHELIAL LYMPHOCYTES OR EVIDENCE OF ACTIVE INFLAMMATION. NEGATIVE FOR DYSPLASIA OR MALIGNANCY. 2. Surgical [P], random colon sites COLONIC MUCOSA WITH NO SIGNIFICANT DIAGNOSTIC ALTERATION. NO EVIDENCE OF LYMPHOCYTIC COLITIS OR COLLAGENOUS COLITIS. NO EVIDENCE OF ACTIVITY, CHRONICITY, GRANULOMA, DYSPLASIA OR MALIGNANCY. 3. Surgical [P], colon,  rectum COLONIC MUCOSA WITH REACTIVE EPITHELIAL CHANGES AND LYMPHOID AGGREGATES. NEGATIVE FOR DYSPLASIA OR MALIGNANCY.   Colonoscopy 01/01/2021 - The examined portion of the ileum was normal.  - Congested, indurated, friable mucosa in the rectum. Biopsied. Path A. RECTUM, BIOPSY:  -  Chronic colitis with focal activity  -  No granulomata, dysplasia or malignancy identified  -  See comment    Clinical Impression  It is my clinical impression that Shawn [name] is a 38 y.o. 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  The patient or caregiver verbalized understanding of the material covered, with no barriers to understanding. All questions were answered. Patient or caregiver is agreeable with the plan outlined above.    It was a pleasure to see Shawn [name].  If you have any questions or concerns regarding this evaluation, do not hesitate to contact me.  Maren Beach, MD [facility]

## 2023-10-11 ENCOUNTER — Encounter: Payer: Self-pay | Admitting: Pediatrics

## 2023-10-11 ENCOUNTER — Telehealth: Payer: Self-pay

## 2023-10-11 ENCOUNTER — Ambulatory Visit (HOSPITAL_COMMUNITY): Payer: Medicaid Other | Attending: Pediatrics

## 2023-10-11 ENCOUNTER — Ambulatory Visit: Payer: Self-pay | Admitting: Pediatrics

## 2023-10-11 DIAGNOSIS — K50113 Crohn's disease of large intestine with fistula: Secondary | ICD-10-CM

## 2023-10-11 DIAGNOSIS — D649 Anemia, unspecified: Secondary | ICD-10-CM

## 2023-10-11 NOTE — Telephone Encounter (Signed)
Called pt to notify office closure tomorrow due to weather at 12pm. Provider has offered to complete a MyChart VV. Unable to reach and have left a VM.

## 2023-10-12 ENCOUNTER — Inpatient Hospital Stay: Payer: Medicaid Other | Admitting: Internal Medicine

## 2023-10-12 ENCOUNTER — Ambulatory Visit (HOSPITAL_BASED_OUTPATIENT_CLINIC_OR_DEPARTMENT_OTHER): Payer: Medicaid Other | Admitting: Family Medicine

## 2023-10-13 ENCOUNTER — Encounter: Payer: Self-pay | Admitting: Pediatrics

## 2023-10-14 DIAGNOSIS — M4628 Osteomyelitis of vertebra, sacral and sacrococcygeal region: Secondary | ICD-10-CM | POA: Diagnosis not present

## 2023-10-18 ENCOUNTER — Emergency Department (HOSPITAL_COMMUNITY)
Admission: EM | Admit: 2023-10-18 | Discharge: 2023-10-18 | Disposition: A | Discharge status: Home / Self Care | Payer: Medicaid Other | Attending: Emergency Medicine | Admitting: Emergency Medicine

## 2023-10-18 ENCOUNTER — Emergency Department (HOSPITAL_COMMUNITY): Payer: Medicaid Other

## 2023-10-18 ENCOUNTER — Other Ambulatory Visit: Payer: Self-pay

## 2023-10-18 DIAGNOSIS — I129 Hypertensive chronic kidney disease with stage 1 through stage 4 chronic kidney disease, or unspecified chronic kidney disease: Secondary | ICD-10-CM | POA: Insufficient documentation

## 2023-10-18 DIAGNOSIS — J189 Pneumonia, unspecified organism: Secondary | ICD-10-CM | POA: Diagnosis not present

## 2023-10-18 DIAGNOSIS — J188 Other pneumonia, unspecified organism: Secondary | ICD-10-CM | POA: Diagnosis not present

## 2023-10-18 DIAGNOSIS — M8668 Other chronic osteomyelitis, other site: Secondary | ICD-10-CM | POA: Diagnosis not present

## 2023-10-18 DIAGNOSIS — N189 Chronic kidney disease, unspecified: Secondary | ICD-10-CM | POA: Insufficient documentation

## 2023-10-18 DIAGNOSIS — R0981 Nasal congestion: Secondary | ICD-10-CM | POA: Diagnosis present

## 2023-10-18 DIAGNOSIS — K4090 Unilateral inguinal hernia, without obstruction or gangrene, not specified as recurrent: Secondary | ICD-10-CM | POA: Diagnosis not present

## 2023-10-18 DIAGNOSIS — K5090 Crohn's disease, unspecified, without complications: Secondary | ICD-10-CM | POA: Diagnosis not present

## 2023-10-18 DIAGNOSIS — M8660 Other chronic osteomyelitis, unspecified site: Secondary | ICD-10-CM

## 2023-10-18 DIAGNOSIS — N289 Disorder of kidney and ureter, unspecified: Secondary | ICD-10-CM | POA: Diagnosis not present

## 2023-10-18 DIAGNOSIS — M869 Osteomyelitis, unspecified: Secondary | ICD-10-CM | POA: Diagnosis not present

## 2023-10-18 LAB — URINALYSIS, ROUTINE W REFLEX MICROSCOPIC
Bilirubin Urine: NEGATIVE
Glucose, UA: NEGATIVE mg/dL
Hgb urine dipstick: NEGATIVE
Ketones, ur: NEGATIVE mg/dL
Leukocytes,Ua: NEGATIVE
Nitrite: NEGATIVE
Protein, ur: NEGATIVE mg/dL
Specific Gravity, Urine: 1.012 (ref 1.005–1.030)
pH: 5 (ref 5.0–8.0)

## 2023-10-18 LAB — CBC WITH DIFFERENTIAL/PLATELET
Abs Immature Granulocytes: 0.1 10*3/uL — ABNORMAL HIGH (ref 0.00–0.07)
Basophils Absolute: 0.1 10*3/uL (ref 0.0–0.1)
Basophils Relative: 1 %
Eosinophils Absolute: 2.7 10*3/uL — ABNORMAL HIGH (ref 0.0–0.5)
Eosinophils Relative: 17 %
HCT: 27.1 % — ABNORMAL LOW (ref 39.0–52.0)
Hemoglobin: 8.6 g/dL — ABNORMAL LOW (ref 13.0–17.0)
Immature Granulocytes: 1 %
Lymphocytes Relative: 14 %
Lymphs Abs: 2.3 10*3/uL (ref 0.7–4.0)
MCH: 27.6 pg (ref 26.0–34.0)
MCHC: 31.7 g/dL (ref 30.0–36.0)
MCV: 86.9 fL (ref 80.0–100.0)
Monocytes Absolute: 0.3 10*3/uL (ref 0.1–1.0)
Monocytes Relative: 2 %
Neutro Abs: 10.7 10*3/uL — ABNORMAL HIGH (ref 1.7–7.7)
Neutrophils Relative %: 65 %
Platelets: 449 10*3/uL — ABNORMAL HIGH (ref 150–400)
RBC: 3.12 MIL/uL — ABNORMAL LOW (ref 4.22–5.81)
RDW: 18.9 % — ABNORMAL HIGH (ref 11.5–15.5)
WBC: 16.2 10*3/uL — ABNORMAL HIGH (ref 4.0–10.5)
nRBC: 0 % (ref 0.0–0.2)

## 2023-10-18 LAB — COMPREHENSIVE METABOLIC PANEL
ALT: 44 U/L (ref 0–44)
AST: 29 U/L (ref 15–41)
Albumin: 2.9 g/dL — ABNORMAL LOW (ref 3.5–5.0)
Alkaline Phosphatase: 91 U/L (ref 38–126)
Anion gap: 10 (ref 5–15)
BUN: 16 mg/dL (ref 6–20)
CO2: 15 mmol/L — ABNORMAL LOW (ref 22–32)
Calcium: 8.4 mg/dL — ABNORMAL LOW (ref 8.9–10.3)
Chloride: 110 mmol/L (ref 98–111)
Creatinine, Ser: 1.34 mg/dL — ABNORMAL HIGH (ref 0.61–1.24)
GFR, Estimated: >60 mL/min (ref >60)
Glucose, Bld: 96 mg/dL (ref 70–99)
Potassium: 4.3 mmol/L (ref 3.5–5.1)
Sodium: 135 mmol/L (ref 135–145)
Total Bilirubin: 0.3 mg/dL (ref 0.0–1.2)
Total Protein: 7.6 g/dL (ref 6.5–8.1)

## 2023-10-18 LAB — I-STAT CG4 LACTIC ACID, ED: Lactic Acid, Venous: 1 mmol/L (ref 0.5–1.9)

## 2023-10-18 MED ORDER — OXYCODONE-ACETAMINOPHEN 5-325 MG PO TABS: 1.0000 | ORAL_TABLET | Freq: Four times a day (QID) | ORAL | 0 refills | Status: DC | PRN

## 2023-10-18 MED ORDER — DAPTOMYCIN-SODIUM CHLORIDE 700-0.9 MG/100ML-% IV SOLN
700.0000 mg | Freq: Once | INTRAVENOUS | Status: AC
  Administered 2023-10-18: 700 mg via INTRAVENOUS
  Filled 2023-10-18: qty 100

## 2023-10-18 MED ORDER — SODIUM CHLORIDE 0.9 % IV SOLN: 2.0000 g | Freq: Once | INTRAVENOUS | Status: DC

## 2023-10-18 MED ORDER — OXYCODONE-ACETAMINOPHEN 5-325 MG PO TABS
1.0000 | ORAL_TABLET | Freq: Once | ORAL | Status: AC
  Administered 2023-10-18: 1 via ORAL
  Filled 2023-10-18: qty 1

## 2023-10-18 MED ORDER — HYDROMORPHONE HCL 1 MG/ML IJ SOLN
0.5000 mg | Freq: Once | INTRAMUSCULAR | Status: AC
  Administered 2023-10-18: 0.5 mg via INTRAVENOUS
  Filled 2023-10-18: qty 1

## 2023-10-18 MED ORDER — IOHEXOL 350 MG/ML SOLN
75.0000 mL | Freq: Once | INTRAVENOUS | Status: AC | PRN
  Administered 2023-10-18: 75 mL via INTRAVENOUS

## 2023-10-18 MED ORDER — DOXYCYCLINE HYCLATE 100 MG PO TABS
100.0000 mg | ORAL_TABLET | Freq: Once | ORAL | Status: AC
  Administered 2023-10-18: 100 mg via ORAL
  Filled 2023-10-18: qty 1

## 2023-10-18 MED ORDER — DOXYCYCLINE HYCLATE 100 MG PO CAPS: 100.0000 mg | ORAL_CAPSULE | Freq: Two times a day (BID) | ORAL | 0 refills | Status: DC

## 2023-10-18 NOTE — ED Triage Notes (Addendum)
 Pt. 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 Ive had some surgery. I need a pic line . Im also had back pain, and neck pain. I missed all of my appt with the Dr. And now I can't be seen til March.

## 2023-10-18 NOTE — ED Provider Notes (Signed)
 [place] EMERGENCY DEPARTMENT AT [facility] Provider Note   CSN: [id number] Arrival date & time: 10/18/23  6213     History  Chief Complaint  Patient presents with   buttocks infection   Tailbone Pain    Shawn [name] is a 38 y.o. male past medical history significant for hypertension, dilated cardiomyopathy, CKD, tobacco abuse, alcohol use, severe Crohn's disease, known osteomyelitis and tailbone infection who presents with concern for pain, need for antibiotics.  Reports pain in tailbone is worsening, pain radiating to neck, head, endorses congestion, denies shortness of breath.   HPI     Home Medications Prior to Admission medications   Medication Sig Start Date End Date Taking? Authorizing Provider  doxycycline (VIBRAMYCIN) 100 MG capsule Take 1 capsule (100 mg total) by mouth 2 (two) times daily. 10/18/23  Yes Bence Trapp H, PA-C  oxyCODONE-acetaminophen (PERCOCET/ROXICET) 5-325 MG tablet Take 1 tablet by mouth every 6 (six) hours as needed for severe pain (pain score 7-10). 10/18/23  Yes Ysabelle Goodroe H, PA-C  acetaminophen (TYLENOL) 500 MG tablet Take 2 tablets (1,000 mg total) by mouth every 8 (eight) hours as needed for moderate pain. 02/18/23   Glyn Ade, MD  cefTRIAXone (ROCEPHIN) IVPB Inject 2 g into the vein daily. Indication:  Osteomyelitis First Dose: Yes Last Day of Therapy:  11/01/23 Labs - Once weekly:  CBC/D and BMP, Labs - Once weekly: ESR and CRP Method of administration: IV Push Method of administration may be changed at the discretion of home infusion pharmacist based upon assessment of the patient and/or caregiver's ability to self-administer the medication ordered. 09/22/23 11/01/23  Odette Fraction, MD  daptomycin (CUBICIN) IVPB Inject 700 mg into the vein daily. Indication:  Osteomyelitis First Dose: Yes Last Day of Therapy:  11/01/23 Labs - Once weekly:  CBC/D, BMP, and CPK Labs - Once weekly: ESR and CRP Method of  administration: IV Push Method of administration may be changed at the discretion of home infusion pharmacist based upon assessment of the patient and/or caregiver's ability to self-administer the medication ordered. 09/22/23 11/01/23  Odette Fraction, MD  hydrocortisone (ANUSOL-HC) 25 MG suppository Unwrap and place 1 suppository (25 mg total) rectally at bedtime. 09/26/23   Laretta Bolster, MD  lidocaine (XYLOCAINE) 2 % jelly Apply 1 Application topically at bedtime. APPLY TO HYDROCORTISONE SUPPOSITORY FOR RECTAL PAIN. 09/26/23   Laretta Bolster, MD  metroNIDAZOLE (FLAGYL) 500 MG tablet Take 1 tablet (500 mg total) by mouth 2 (two) times daily. 09/22/23 11/01/23  Odette Fraction, MD  senna (SENOKOT) 8.6 MG TABS tablet Take 2 tablets (17.2 mg total) by mouth daily. 09/27/23   Laretta Bolster, MD  ustekinumab (STELARA) 90 MG/ML SOSY injection Inject 1 mL (90 mg total) into the skin every 8 (eight) weeks. no further refills unless appt is kept Patient not taking: Reported on 09/19/2023 01/19/23   Meryl Dare, MD      Allergies    Shrimp [shellfish allergy] and Nsaids    Review of Systems   Review of Systems  All other systems reviewed and are negative.   Physical Exam Updated Vital Signs BP (!) 124/96 (BP Location: Right Arm)   Pulse 97   Temp 98.7 F (37.1 C)   Resp 20   Ht 6\' 1"  (1.854 m)   Wt 86.2 kg   SpO2 100%   BMI 25.07 kg/m  Physical Exam Vitals and nursing note reviewed.  Constitutional:      General: He is not in acute distress.  Appearance: Normal appearance.  HENT:     Head: Normocephalic and atraumatic.  Eyes:     General:        Right eye: No discharge.        Left eye: No discharge.  Cardiovascular:     Rate and Rhythm: Normal rate and regular rhythm.     Heart sounds: No murmur heard.    No friction rub. No gallop.  Pulmonary:     Effort: Pulmonary effort is normal.     Breath sounds: Normal breath sounds.  Abdominal:     General: Bowel sounds are  normal.     Palpations: Abdomen is soft.  Skin:    General: Skin is warm and dry.     Capillary Refill: Capillary refill takes less than 2 seconds.     Comments: No evidence of overlying skin changes on buttocks  Neurological:     Mental Status: He is alert and oriented to person, place, and time.     Comments: Cranial nerves II through XII grossly intact.  Intact finger-nose, intact heel-to-shin.  Romberg negative, gait normal.  Alert and oriented x3.  Moves all 4 limbs spontaneously, normal coordination.  No pronator drift.  Intact strength 5 out of 5 bilateral upper and lower extremities.  Psychiatric:        Mood and Affect: Mood normal.        Behavior: Behavior normal.     ED Results / Procedures / Treatments   Labs (all labs ordered are listed, but only abnormal results are displayed) Labs Reviewed  COMPREHENSIVE METABOLIC PANEL - Abnormal; Notable for the following components:      Result Value   CO2 15 (*)    Creatinine, Ser 1.34 (*)    Calcium 8.4 (*)    Albumin 2.9 (*)    All other components within normal limits  CBC WITH DIFFERENTIAL/PLATELET - Abnormal; Notable for the following components:   WBC 16.2 (*)    RBC 3.12 (*)    Hemoglobin 8.6 (*)    HCT 27.1 (*)    RDW 18.9 (*)    Platelets 449 (*)    Neutro Abs 10.7 (*)    Eosinophils Absolute 2.7 (*)    Abs Immature Granulocytes 0.10 (*)    All other components within normal limits  URINALYSIS, ROUTINE W REFLEX MICROSCOPIC  I-STAT CG4 LACTIC ACID, ED    EKG None  Radiology CT ABDOMEN PELVIS W CONTRAST Result Date: 10/18/2023 CLINICAL DATA:  Crohn's exacerbation. Worsening rectal pain. No fever or vomiting. EXAM: CT ABDOMEN AND PELVIS WITH CONTRAST TECHNIQUE: Multidetector CT imaging of the abdomen and pelvis was performed using the standard protocol following bolus administration of intravenous contrast. RADIATION DOSE REDUCTION: This exam was performed according to the departmental dose-optimization program  which includes automated exposure control, adjustment of the mA and/or kV according to patient size and/or use of iterative reconstruction technique. CONTRAST:  75mL OMNIPAQUE IOHEXOL 350 MG/ML SOLN COMPARISON:  CT scan abdomen and pelvis from 09/23/2023. FINDINGS: Lower chest: There are patchy peripheral opacities throughout the imaged bilateral lungs concerning for multilobar pneumonia. No pleural effusion. The heart is normal in size. No pericardial effusion. Hepatobiliary: The liver is normal in size. Non-cirrhotic configuration. No suspicious mass. No intrahepatic or extrahepatic bile duct dilation. No calcified gallstones. Normal gallbladder wall thickness. No pericholecystic inflammatory changes. Pancreas: Unremarkable. No pancreatic ductal dilatation or surrounding inflammatory changes. Spleen: Within normal limits. No focal lesion. Adrenals/Urinary Tract: Adrenal glands are unremarkable. No suspicious  renal mass. Redemonstration of multiple bilateral hypoattenuating renal lesions with largest in the left kidney lower pole, anteriorly measuring up to 3.7 x 3.4 cm. Multiple subcentimeter sized hypoattenuating lesions are not well characterized on the current exam. No nephroureterolithiasis or obstructive uropathy. Unremarkable urinary bladder. Stomach/Bowel: No disproportionate dilation of the small or large bowel loops. No evidence of abnormal bowel wall thickening or inflammatory changes. The appendix is unremarkable. Vascular/Lymphatic: No ascites or pneumoperitoneum. No abdominal or pelvic lymphadenopathy, by size criteria. No aneurysmal dilation of the major abdominal arteries. Reproductive: Normal size prostate. Symmetric seminal vesicles. Other: Redemonstration of a seton along the gluteal cleft, which lies posterior to the anal canal at 6 o'clock position. There is soft tissue thickening, presacral fluid and multiple foci of extraluminal air, decreased since the prior study. There are few small  hypoattenuating collections with peripheral slightly hyperattenuating walls, for example a 1.7 x 2.1 cm collection along the medial aspect of the left gluteus maximus muscle. These may represent small abscess/collection however, overall slightly decreased in size and number since the prior study. There is a small fat containing right inguinal hernia. The soft tissues and abdominal wall are otherwise unremarkable. Musculoskeletal: No suspicious osseous lesions. IMPRESSION: 1. Overall interval improvement in the multiple perianal sinus tracts, presacral fluid and small abscesses/collections when compared to the prior exam from 09/23/2023. 2. Otherwise no acute inflammatory process identified within the abdomen or pelvis. 3. There are patchy peripheral opacities throughout the imaged bilateral lungs concerning for multilobar pneumonia. Follow-up to clearing is recommended. 4. Multiple other nonacute observations, as described above. Electronically Signed   By: Jules Schick M.D.   On: 10/18/2023 14:38    Procedures Procedures    Medications Ordered in ED Medications  doxycycline (VIBRA-TABS) tablet 100 mg (has no administration in time range)  DAPTOmycin (CUBICIN) IVPB 700 mg/125mL premix (0 mg Intravenous Stopped 10/18/23 1819)  HYDROmorphone (DILAUDID) injection 0.5 mg (0.5 mg Intravenous Given 10/18/23 1058)  iohexol (OMNIPAQUE) 350 MG/ML injection 75 mL (75 mLs Intravenous Contrast Given 10/18/23 1338)    ED Course/ Medical Decision Making/ A&P Clinical Course as of 10/18/23 1822  Tue Oct 18, 2023  1746 Stable PA CHP Crohn's DX Recurrent Osteo of his sacrum and required IVABX. Here with worsening pain in his tailbone and missed appointments [CC]  1759 I evaluated patient at bedside. He has an expansive medical history. Crohn's disease, severe recurrent osteomyelitis.  He is on daptomycin in the outpatient setting.  He has been receiving his antibiotics daily.Had a long conversation with the  patient at bedside. He states the main reason he came today is ongoing tailbone pain.  States that he has run out of all the pain medication he was discharged from the hospital with.  He was supposed to follow-up in the outpatient clinics for his long-term specialty care providers and primary provider this week but he missed the appointments due to his car being broken down.  He has a myriad of other complaints including some congestion and cough as well as some headache and neck pain but states that these are mild compared to his ongoing buttocks pain.  [CC]    Clinical Course User Index [CC] Glyn Ade, MD                                 Medical Decision Making  This patient is a 38 y.o. male  who presents to the ED for  concern of rectal pain, neck pain, head pain.   Differential diagnoses prior to evaluation: The emergent differential diagnosis includes, but is not limited to,  worsening infection, new infectious complication of crohns, osteomyelitis . This is not an exhaustive differential.   Past Medical History / Co-morbidities / Social History: hypertension, dilated cardiomyopathy, CKD, tobacco abuse, alcohol use, severe Crohn's disease, known osteomyelitis and tailbone infection  Additional history: Chart reviewed. Pertinent results include: Reviewed lab work, imaging from recent inpatient admission, as well as outpatient GI visits  Physical Exam: Physical exam performed. The pertinent findings include: No evidence of overlying skin changes on buttocks   Cranial nerves II through XII grossly intact.  Intact finger-nose, intact heel-to-shin.  Romberg negative, gait normal.  Alert and oriented x3.  Moves all 4 limbs spontaneously, normal coordination.  No pronator drift.  Intact strength 5 out of 5 bilateral upper and lower extremities.   Lab Tests/Imaging studies: I personally interpreted labs/imaging and the pertinent results include: Slightly worsening leukocytosis from  baseline, white blood cell 16.2, possibly secondary to untreated pneumonia, hemoglobin around stable compared to baseline, he has some chronic anemia.  CMP with bicarb deficit, CO2 to 15, no anion gap.Raynald Blend interpreted CT abdomen pelvis with contrast, lower lobes look suspicious for possible multifocal pneumonia although some areas on my exam look closer to atelectasis given his worsening white count we will add doxycycline onto his antibiotic regimen, his known osteomyelitis and sacral infection looks improved compared to baseline.  I agree with the radiologist interpretation. \ Medications: I ordered medication including PICC line antibiotics, Dilaudid for pain, doxycycline for infection.  I have reviewed the patients home medicines and have made adjustments as needed.  Will plan to discharge with doxycycline, refill of pain medication and close follow-up with GI doctor.   Disposition: After consideration of the diagnostic results and the patients response to treatment, I feel that patient stable for discharge with plan as above, he is not hypoxic, he reports that overall his pain is not significantly worsened, just under poor control because of running out of his pain medicine at home.  He denies any fevers at home.  I think that ultimately despite his chronic infectious status there is no reason for acute hospital admission even in context of new pneumonia.   emergency department workup does not suggest an emergent condition requiring admission or immediate intervention beyond what has been performed at this time. The plan is: As above. The patient is safe for discharge and has been instructed to return immediately for worsening symptoms, change in symptoms or any other concerns.  Final Clinical Impression(s) / ED Diagnoses Final diagnoses:  Multifocal pneumonia  Other chronic osteomyelitis, unspecified site [facility])    Rx / DC Orders ED Discharge Orders          Ordered     oxyCODONE-acetaminophen (PERCOCET/ROXICET) 5-325 MG tablet  Every 6 hours PRN        10/18/23 1809    doxycycline (VIBRAMYCIN) 100 MG capsule  2 times daily        10/18/23 [address], Edyth Gunnels 10/18/23 Katheren Puller, MD 10/20/23 1616

## 2023-10-18 NOTE — ED Provider Triage Note (Signed)
 Emergency Medicine Provider Triage Evaluation Note  Shawn [name] , a 38 y.o. male  was evaluated in triage.  Pt complains of assisting in worsening tailbone and rectal pain in the setting of known osteomyelitis and advanced Crohn's disease.  Patient denies fever.  No vomiting.  Review of Systems  Positive: Pain. Negative: Fever, vomiting.  Physical Exam  BP (!) 124/96 (BP Location: Right Arm)   Pulse 97   Temp 98.7 F (37.1 C)   Resp 20   Ht 6\' 1"  (1.854 m)   Wt 86.2 kg   SpO2 100%   BMI 25.07 kg/m  Gen:   Nontoxic and alert.  Uncomfortable in appearance. Resp:  Normal effort, clear to auscultation MSK:   Moves extremities without difficulty  Other:  No externally visible wounds over the sacrum or buttocks.  Medical Decision Making  Medically screening exam initiated at 10:14 AM.  Appropriate orders placed.  Shawn [name] was informed that the remainder of the evaluation will be completed by another provider, this initial triage assessment does not replace that evaluation, and the importance of remaining in the ED until their evaluation is complete.  Patient is due for ceftriaxone and daptomycin via PICC line at 11 AM.  1 mg Dilaudid ordered for pain control.  These medications are ordered.  Patient presents with worsening rectal and sacral pain in the setting of known osteomyelitis.  Will obtain basic lab work and CT scan.   Arby Barrette, MD 10/18/23 1030

## 2023-10-18 NOTE — ED Notes (Signed)
 Pt refuse vital check

## 2023-10-18 NOTE — Discharge Instructions (Addendum)
 Please use Tylenol or ibuprofen for pain.  You may use 600 mg ibuprofen every 6 hours or 1000 mg of Tylenol every 6 hours.  You may choose to alternate between the 2.  This would be most effective.  Not to exceed 4 g of Tylenol within 24 hours.  Not to exceed 3200 mg ibuprofen 24 hours.  You can use the stronger narcotic pain medication in place of Tylenol for severe break through pain.  If you take the narcotic pain medication that we prescribed recommend that you also take a laxative such as MiraLAX or Dulcolax every day that you take the narcotic pain medicine, and drink plenty of fluids, 50 to 64 ounces to prevent any constipation.  Please take the entire course of doxycycline in addition to your picc line medications and follow up closely with outpatient providers.

## 2023-10-20 DIAGNOSIS — M4628 Osteomyelitis of vertebra, sacral and sacrococcygeal region: Secondary | ICD-10-CM | POA: Diagnosis not present

## 2023-10-21 ENCOUNTER — Encounter (HOSPITAL_BASED_OUTPATIENT_CLINIC_OR_DEPARTMENT_OTHER): Payer: Self-pay | Admitting: Family Medicine

## 2023-10-21 DIAGNOSIS — M4628 Osteomyelitis of vertebra, sacral and sacrococcygeal region: Secondary | ICD-10-CM | POA: Diagnosis not present

## 2023-10-25 ENCOUNTER — Encounter (HOSPITAL_COMMUNITY): Payer: Self-pay | Admitting: Emergency Medicine

## 2023-10-25 ENCOUNTER — Emergency Department (HOSPITAL_COMMUNITY)

## 2023-10-25 ENCOUNTER — Ambulatory Visit: Payer: Medicaid Other | Admitting: Internal Medicine

## 2023-10-25 ENCOUNTER — Telehealth: Payer: Self-pay

## 2023-10-25 ENCOUNTER — Emergency Department (HOSPITAL_COMMUNITY)
Admission: EM | Admit: 2023-10-25 | Discharge: 2023-10-25 | Disposition: A | Discharge status: Home / Self Care | Attending: Emergency Medicine | Admitting: Emergency Medicine

## 2023-10-25 ENCOUNTER — Encounter: Payer: Self-pay | Admitting: Internal Medicine

## 2023-10-25 ENCOUNTER — Other Ambulatory Visit: Payer: Self-pay

## 2023-10-25 VITALS — BP 115/72 | HR 102 | Temp 97.8°F | Ht 73.0 in | Wt 174.0 lb

## 2023-10-25 DIAGNOSIS — M4628 Osteomyelitis of vertebra, sacral and sacrococcygeal region: Secondary | ICD-10-CM

## 2023-10-25 DIAGNOSIS — R1013 Epigastric pain: Secondary | ICD-10-CM | POA: Insufficient documentation

## 2023-10-25 DIAGNOSIS — R9431 Abnormal electrocardiogram [ECG] [EKG]: Secondary | ICD-10-CM | POA: Diagnosis not present

## 2023-10-25 DIAGNOSIS — R197 Diarrhea, unspecified: Secondary | ICD-10-CM

## 2023-10-25 DIAGNOSIS — N3289 Other specified disorders of bladder: Secondary | ICD-10-CM | POA: Diagnosis not present

## 2023-10-25 DIAGNOSIS — K760 Fatty (change of) liver, not elsewhere classified: Secondary | ICD-10-CM | POA: Diagnosis not present

## 2023-10-25 DIAGNOSIS — R112 Nausea with vomiting, unspecified: Secondary | ICD-10-CM | POA: Diagnosis not present

## 2023-10-25 DIAGNOSIS — E860 Dehydration: Secondary | ICD-10-CM | POA: Diagnosis not present

## 2023-10-25 DIAGNOSIS — R0602 Shortness of breath: Secondary | ICD-10-CM | POA: Diagnosis not present

## 2023-10-25 LAB — COMPREHENSIVE METABOLIC PANEL
ALT: 37 U/L (ref 0–44)
AST: 19 U/L (ref 15–41)
Albumin: 3 g/dL — ABNORMAL LOW (ref 3.5–5.0)
Alkaline Phosphatase: 86 U/L (ref 38–126)
Anion gap: 16 — ABNORMAL HIGH (ref 5–15)
BUN: 21 mg/dL — ABNORMAL HIGH (ref 6–20)
CO2: 13 mmol/L — ABNORMAL LOW (ref 22–32)
Calcium: 8.8 mg/dL — ABNORMAL LOW (ref 8.9–10.3)
Chloride: 101 mmol/L (ref 98–111)
Creatinine, Ser: 1.88 mg/dL — ABNORMAL HIGH (ref 0.61–1.24)
GFR, Estimated: 46 mL/min — ABNORMAL LOW (ref >60)
Glucose, Bld: 131 mg/dL — ABNORMAL HIGH (ref 70–99)
Potassium: 3.7 mmol/L (ref 3.5–5.1)
Sodium: 130 mmol/L — ABNORMAL LOW (ref 135–145)
Total Bilirubin: 0.5 mg/dL (ref 0.0–1.2)
Total Protein: 8.1 g/dL (ref 6.5–8.1)

## 2023-10-25 LAB — CBC
HCT: 27.9 % — ABNORMAL LOW (ref 39.0–52.0)
Hemoglobin: 9.4 g/dL — ABNORMAL LOW (ref 13.0–17.0)
MCH: 27.4 pg (ref 26.0–34.0)
MCHC: 33.7 g/dL (ref 30.0–36.0)
MCV: 81.3 fL (ref 80.0–100.0)
Platelets: 566 10*3/uL — ABNORMAL HIGH (ref 150–400)
RBC: 3.43 MIL/uL — ABNORMAL LOW (ref 4.22–5.81)
RDW: 18.4 % — ABNORMAL HIGH (ref 11.5–15.5)
WBC: 27.5 10*3/uL — ABNORMAL HIGH (ref 4.0–10.5)
nRBC: 0 % (ref 0.0–0.2)

## 2023-10-25 LAB — URINALYSIS, ROUTINE W REFLEX MICROSCOPIC
Bacteria, UA: NONE SEEN
Bilirubin Urine: NEGATIVE
Glucose, UA: NEGATIVE mg/dL
Hgb urine dipstick: NEGATIVE
Ketones, ur: NEGATIVE mg/dL
Leukocytes,Ua: NEGATIVE
Nitrite: NEGATIVE
Protein, ur: 30 mg/dL — AB
Specific Gravity, Urine: 1.021 (ref 1.005–1.030)
pH: 5 (ref 5.0–8.0)

## 2023-10-25 LAB — C DIFFICILE QUICK SCREEN W PCR REFLEX
C Diff antigen: NEGATIVE
C Diff interpretation: NOT DETECTED
C Diff toxin: NEGATIVE

## 2023-10-25 LAB — RESP PANEL BY RT-PCR (RSV, FLU A&B, COVID)  RVPGX2
Influenza A by PCR: NEGATIVE
Influenza B by PCR: NEGATIVE
Resp Syncytial Virus by PCR: NEGATIVE
SARS Coronavirus 2 by RT PCR: NEGATIVE

## 2023-10-25 LAB — I-STAT CG4 LACTIC ACID, ED: Lactic Acid, Venous: 1.4 mmol/L (ref 0.5–1.9)

## 2023-10-25 LAB — LIPASE, BLOOD: Lipase: 62 U/L — ABNORMAL HIGH (ref 11–51)

## 2023-10-25 MED ORDER — SODIUM CHLORIDE 0.9 % IV BOLUS
2000.0000 mL | Freq: Once | INTRAVENOUS | Status: DC
Start: 2023-10-25 — End: 2023-10-25

## 2023-10-25 MED ORDER — ONDANSETRON HCL 4 MG/2ML IJ SOLN
4.0000 mg | Freq: Once | INTRAMUSCULAR | Status: AC
  Administered 2023-10-25: 4 mg via INTRAVENOUS
  Filled 2023-10-25: qty 2

## 2023-10-25 MED ORDER — IOHEXOL 350 MG/ML SOLN
75.0000 mL | Freq: Once | INTRAVENOUS | Status: AC | PRN
  Administered 2023-10-25: 75 mL via INTRAVENOUS

## 2023-10-25 MED ORDER — HYDROMORPHONE HCL 1 MG/ML IJ SOLN
0.5000 mg | Freq: Once | INTRAMUSCULAR | Status: AC
  Administered 2023-10-25: 0.5 mg via INTRAVENOUS
  Filled 2023-10-25: qty 1

## 2023-10-25 MED ORDER — CEFADROXIL 500 MG PO CAPS: 1000.0000 mg | ORAL_CAPSULE | Freq: Two times a day (BID) | ORAL | 0 refills | Status: DC

## 2023-10-25 MED ORDER — LACTATED RINGERS IV BOLUS
1000.0000 mL | Freq: Once | INTRAVENOUS | Status: AC
  Administered 2023-10-25: 1000 mL via INTRAVENOUS

## 2023-10-25 MED ORDER — OXYCODONE-ACETAMINOPHEN 5-325 MG PO TABS: 1.0000 | ORAL_TABLET | Freq: Four times a day (QID) | ORAL | 0 refills | Status: DC | PRN

## 2023-10-25 MED ORDER — METRONIDAZOLE 500 MG PO TABS
500.0000 mg | ORAL_TABLET | Freq: Two times a day (BID) | ORAL | 0 refills | Status: DC
Start: 2023-11-02 — End: 2023-12-14

## 2023-10-25 NOTE — Discharge Instructions (Addendum)
 Please read and follow all provided instructions.  Your diagnoses today include:  1. Diarrhea, unspecified type   2. Dehydration   3. Abnormal EKG     Tests performed today include: Blood cell counts and platelets: White blood cell count is very high Kidney and liver function tests: Kidney function was slightly weak likely in part due to dehydration Pancreas function test (called lipase) Urine test to look for infection Stool testing was sent and results will be pending, you will be notified with any results that need additional action CT scan of your abdomen pelvis: Overall appears improved, no signs of a severe or serious intra-abdominal infection today Vital signs. See below for your results today.   Medications prescribed:  Percocet (oxycodone/acetaminophen) - narcotic pain medication  DO NOT drive or perform any activities that require you to be awake and alert because this medicine can make you drowsy. BE VERY CAREFUL not to take multiple medicines containing Tylenol (also called acetaminophen). Doing so can lead to an overdose which can damage your liver and cause liver failure and possibly death.  Take any prescribed medications only as directed.  Home care instructions:  Follow any educational materials contained in this packet.  Follow-up instructions: Please follow-up with your primary care provider in the next 3 days for further evaluation of your symptoms.    Return instructions:  SEEK IMMEDIATE MEDICAL ATTENTION IF: The pain does not go away or becomes severe  A temperature above 101F develops  Repeated vomiting occurs (multiple episodes)  The pain becomes localized to portions of the abdomen. The right side could possibly be appendicitis. In an adult, the left lower portion of the abdomen could be colitis or diverticulitis.  Blood is being passed in stools or vomit (bright red or black tarry stools)  You develop chest pain, difficulty breathing, dizziness or  fainting, or become confused, poorly responsive, or inconsolable (young children) If you have any other emergent concerns regarding your health  Additional Information: Abdominal (belly) pain can be caused by many things. Your caregiver performed an examination and possibly ordered blood/urine tests and imaging (CT scan, x-rays, ultrasound). Many cases can be observed and treated at home after initial evaluation in the emergency department. Even though you are being discharged home, abdominal pain can be unpredictable. Therefore, you need a repeated exam if your pain does not resolve, returns, or worsens. Most patients with abdominal pain don't have to be admitted to the hospital or have surgery, but serious problems like appendicitis and gallbladder attacks can start out as nonspecific pain. Many abdominal conditions cannot be diagnosed in one visit, so follow-up evaluations are very important.  Your vital signs today were: BP 123/81   Pulse 85   Temp 99.5 F (37.5 C) (Oral)   Resp 13   Ht 6\' 1"  (1.854 m)   Wt 78.9 kg   SpO2 100%   BMI 22.96 kg/m  If your blood pressure (bp) was elevated above 135/85 this visit, please have this repeated by your doctor within one month. --------------

## 2023-10-25 NOTE — Telephone Encounter (Signed)
 Per Dr. Luciana Axe the end date to stop IV abx and pull picc after last dose is on 3/11. Sent a message to [employer] about end date as well.

## 2023-10-25 NOTE — ED Provider Notes (Addendum)
 [facility] AT [facility] Provider Note   CSN: [id number] Arrival date & time: 10/25/23  1108     History  Chief Complaint  Patient presents with   Shortness of Breath   Abdominal Pain    Shawn [name] is a 38 y.o. male.  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  Patient reports that he has had 1 to 2 days of persistent watery, nonbloody stool, with foul odor.  No vomiting.  He feels that it like anything he eats or drinks goes right through him.  He feels dizziness when he stands up.  He has epigastric pain.  No fevers.  Shortness of breath as well.      Home Medications Prior to Admission medications   Medication Sig Start Date End Date Taking? Authorizing Provider  acetaminophen (TYLENOL) 500 MG tablet Take 2 tablets (1,000 mg total) by mouth every 8 (eight) hours as needed for moderate pain. 02/18/23   Glyn Ade, MD  cefadroxil (DURICEF) 500 MG capsule Take 2 capsules (1,000 mg total) by mouth 2 (two) times daily. 11/02/23   Comer, Belia Heman, MD  cefTRIAXone (ROCEPHIN) IVPB Inject 2 g into the vein daily. Indication:  Osteomyelitis First Dose: Yes Last Day of Therapy:  11/01/23 Labs - Once weekly:  CBC/D and BMP, Labs - Once weekly: ESR and CRP Method of administration: IV Push Method of administration may be changed at the discretion of home infusion pharmacist based upon assessment of the patient and/or caregiver's ability to self-administer the medication ordered. 09/22/23 11/01/23  Odette Fraction, MD  daptomycin (CUBICIN) IVPB Inject 700 mg into  the vein daily. Indication:  Osteomyelitis First Dose: Yes Last Day of Therapy:  11/01/23 Labs - Once weekly:  CBC/D, BMP, and CPK Labs - Once weekly: ESR and CRP Method of administration: IV Push Method of administration may be changed at the discretion of home infusion pharmacist based upon assessment of the patient and/or caregiver's ability to self-administer the medication ordered. 09/22/23 11/01/23  Odette Fraction, MD  doxycycline (VIBRAMYCIN) 100 MG capsule Take 1 capsule (100 mg total) by mouth 2 (two) times daily. 10/18/23   Prosperi, Christian H, PA-C  hydrocortisone (ANUSOL-HC) 25 MG suppository Unwrap and place 1 suppository (25 mg total) rectally at bedtime. 09/26/23   Laretta Bolster, MD  lidocaine (XYLOCAINE) 2 % jelly Apply 1 Application topically at bedtime. APPLY TO HYDROCORTISONE SUPPOSITORY FOR RECTAL PAIN. 09/26/23   Laretta Bolster, MD  metroNIDAZOLE (FLAGYL) 500 MG tablet Take 1 tablet (500 mg total) by mouth 2 (two) times daily. 09/22/23 11/01/23  Odette Fraction, MD  metroNIDAZOLE (FLAGYL) 500 MG tablet Take 1 tablet (500 mg total) by mouth 2 (two) times daily. 11/02/23   Comer, Belia Heman, MD  oxyCODONE-acetaminophen (PERCOCET/ROXICET) 5-325 MG tablet Take 1 tablet by mouth every 6 (six) hours as needed for severe pain (pain score 7-10). 10/18/23   Prosperi, Christian H, PA-C  senna (SENOKOT) 8.6 MG TABS tablet Take 2 tablets (17.2 mg total) by mouth daily. 09/27/23   Laretta Bolster, MD  ustekinumab (STELARA) 90 MG/ML SOSY injection Inject 1 mL (90 mg total) into the skin every 8 (eight) weeks. no  further refills unless appt is kept Patient not taking: Reported on 10/25/2023 01/19/23   Meryl Dare, MD      Allergies    Shrimp [shellfish allergy] and Nsaids    Review of Systems   Review of Systems  Physical Exam Updated Vital Signs BP 115/75   Pulse 95   Temp 98.3 F (36.8 C) (Oral)   Resp 18   Ht 6\' 1"  (1.854 m)   Wt 78.9 kg   SpO2 100%   BMI 22.96 kg/m    Physical Exam Vitals and nursing note reviewed.  Constitutional:      General: He is not in acute distress.    Appearance: He is well-developed.  HENT:     Head: Normocephalic and atraumatic.  Eyes:     General:        Right eye: No discharge.        Left eye: No discharge.     Conjunctiva/sclera: Conjunctivae normal.  Cardiovascular:     Rate and Rhythm: Normal rate and regular rhythm.     Heart sounds: Normal heart sounds.  Pulmonary:     Effort: Pulmonary effort is normal.     Breath sounds: Normal breath sounds.  Abdominal:     Palpations: Abdomen is soft.     Tenderness: There is abdominal tenderness (epigastric).  Musculoskeletal:     Cervical back: Normal range of motion and neck supple.  Skin:    General: Skin is warm and dry.  Neurological:     Mental Status: He is alert.    ED Results / Procedures / Treatments   Labs (all labs ordered are listed, but only abnormal results are displayed) Labs Reviewed  LIPASE, BLOOD - Abnormal; Notable for the following components:      Result Value   Lipase 62 (*)    All other components within normal limits  COMPREHENSIVE METABOLIC PANEL - Abnormal; Notable for the following components:   Sodium 130 (*)    CO2 13 (*)    Glucose, Bld 131 (*)    BUN 21 (*)    Creatinine, Ser 1.88 (*)    Calcium 8.8 (*)    Albumin 3.0 (*)    GFR, Estimated 46 (*)    Anion gap 16 (*)    All other components within normal limits  CBC - Abnormal; Notable for the following components:   WBC 27.5 (*)    RBC 3.43 (*)    Hemoglobin 9.4 (*)    HCT 27.9 (*)    RDW 18.4 (*)    Platelets 566 (*)    All other components within normal limits  RESP PANEL BY RT-PCR (RSV, FLU A&B, COVID)  RVPGX2  GASTROINTESTINAL PANEL BY PCR, STOOL (REPLACES STOOL CULTURE)  C DIFFICILE QUICK SCREEN W PCR REFLEX    URINALYSIS, ROUTINE W REFLEX MICROSCOPIC    EKG EKG Interpretation Date/Time:  Tuesday October 25 2023 12:03:54 EST Ventricular Rate:  112 PR  Interval:  128 QRS Duration:  90 QT Interval:  326 QTC Calculation: 444 R Axis:   91  Text Interpretation: Sinus tachycardia Rightward axis Biventricular hypertrophy with repolarization abnormality Abnormal ECG When compared with ECG of 18-Sep-2023 17:26, PREVIOUS ECG IS PRESENT Confirmed by Coralee Pesa [phone]) on 10/25/2023 8:10:32 PM  Radiology CT ABDOMEN PELVIS W CONTRAST Result Date: 10/25/2023 CLINICAL DATA:  Shortness of breath with nausea and vomiting for several days, initial encounter EXAM: CT ABDOMEN AND PELVIS WITH CONTRAST TECHNIQUE: Multidetector CT imaging of the abdomen  and pelvis was performed using the standard protocol following bolus administration of intravenous contrast. RADIATION DOSE REDUCTION: This exam was performed according to the departmental dose-optimization program which includes automated exposure control, adjustment of the mA and/or kV according to patient size and/or use of iterative reconstruction technique. CONTRAST:  75mL OMNIPAQUE IOHEXOL 350 MG/ML SOLN COMPARISON:  10/18/2023 FINDINGS: Lower chest: Patchy atelectatic changes are noted in the bases bilaterally. This is relatively similar to that seen on the prior exam. Hepatobiliary: Fatty infiltration of the liver is noted. The gallbladder is unremarkable. Pancreas: Unremarkable. No pancreatic ductal dilatation or surrounding inflammatory changes. Spleen: Normal in size without focal abnormality. Adrenals/Urinary Tract: Adrenal glands are within normal limits. Kidneys demonstrate multiple cystic lesions bilaterally stable in appearance from the prior exam. No renal calculi or obstructive changes are seen. The bladder is well distended. Stomach/Bowel: Burnadette Pop is again identified in the 6 o'clock position of the anus. Previously seen fluid collection along the left gluteus muscle adjacent to the posterior aspect of the inferior pubic ramus has decompressed and has a small amount of air within. A similar finding is noted  on the right without significant fluid collection. No new fluid collection is identified. Air-fluid collection is noted along the anterior aspect of the sacrum similar in size to that seen on the prior exam although air is now noted within. This also likely represents some interval improvement when compared with the prior study. No obstructive or inflammatory changes of the colon are seen. Retained fluid in the colon is noted likely related to a diarrheal state. The appendix is within normal limits. No obstructive changes of the small bowel are noted. The stomach is within normal limits. Vascular/Lymphatic: No vascular abnormality is noted. A few scattered perirectal lymph nodes are noted. Reproductive: Prostate is unremarkable. Other: No free pelvic fluid is noted.  No free air is seen. Musculoskeletal: No acute or significant osseous findings. IMPRESSION: Burnadette Pop is again noted in place. Previously seen fluid collections in the pelvis have decreased in the interval from the prior exam now containing air consistent with continued improvement. No new fluid collection is identified. Fluid is noted throughout the colon consistent with the diarrheal state. Mild bibasilar atelectatic changes. Electronically Signed   By: Alcide Clever M.D.   On: 10/25/2023 19:53   DG Chest 1 View Result Date: 10/25/2023 CLINICAL DATA:  Shortness of breath, dehydration, nausea, vomiting diarrhea EXAM: CHEST  1 VIEW COMPARISON:  04/03/2023 FINDINGS: Limited exam because of positioning. Normal heart size and vascularity. No focal pneumonia, edema, large effusion or pneumothorax. Trachea midline. No osseous abnormality. Right upper extremity PICC line tip is angulated at the level of the azygos vein IMPRESSION: 1. No acute chest process. 2. Right upper extremity PICC line tip is angulated at the level of the azygos vein.a Electronically Signed   By: Judie Petit.  Shick M.D.   On: 10/25/2023 14:52    Procedures Procedures    Medications Ordered  in ED Medications  lactated ringers bolus 1,000 mL (0 mLs Intravenous Stopped 10/25/23 2000)  lactated ringers bolus 1,000 mL (0 mLs Intravenous Stopped 10/25/23 2000)  HYDROmorphone (DILAUDID) injection 0.5 mg (0.5 mg Intravenous Given 10/25/23 1555)  ondansetron (ZOFRAN) injection 4 mg (4 mg Intravenous Given 10/25/23 1554)  ondansetron (ZOFRAN) injection 4 mg (4 mg Intravenous Given 10/25/23 1826)  HYDROmorphone (DILAUDID) injection 0.5 mg (0.5 mg Intravenous Given 10/25/23 1826)  iohexol (OMNIPAQUE) 350 MG/ML injection 75 mL (75 mLs Intravenous Contrast Given 10/25/23 1743)  HYDROmorphone (DILAUDID) injection  0.5 mg (0.5 mg Intravenous Given 10/25/23 2154)    ED Course/ Medical Decision Making/ A&P    Patient seen and examined. History obtained directly from patient.   Labs/EKG: Ordered CBC with elevated white blood cell count at 27,500, hemoglobin 9.4 which is slightly above baseline over the past month; CMP with low sodium at 130, bicarb low at 13; elevated creatinine at 1.88 and BUN of 21; normal liver function test, anion gap elevated at 16; lipase slightly elevated at 62; lactate normal at 1.4.  Added GI pathogen panel and C. difficile testing.  Imaging: Chest x-ray personally reviewed and interpreted, shows PICC line, no signs of infection; added CT abdomen pelvis due to diarrhea, abdominal pain and elevated white blood cell count.  Medications/Fluids: Ordered: IV fluid bolus, IV hydromorphone and Zofran.  Most recent vital signs reviewed and are as follows: BP 123/79   Pulse 86   Temp 100.2 F (37.9 C) (Oral)   Resp 20   Ht 6\' 1"  (1.854 m)   Wt 78.9 kg   SpO2 100%   BMI 22.96 kg/m   Initial impression: Patient with dehydration due to diarrhea, abdominal pain, currently on IV antibiotics due to osteomyelitis.  8:22 PM Reassessment performed. Patient appears stable.   Reviewed results with Dr. Wilkie Aye, who will also see patient. WE reviewed t-wave abnl on EKG.   Imaging personally  visualized and interpreted including: Agree interval improvement of sacral osteomyelitis and fluid collection, no concerning acute intra-abdominal etiology of symptoms or infection.  Reviewed pertinent lab work and imaging with patient at bedside. Questions answered.   Most current vital signs reviewed and are as follows: BP 124/85   Pulse 94   Temp 99.5 F (37.5 C) (Oral)   Resp 11   Ht 6\' 1"  (1.854 m)   Wt 78.9 kg   SpO2 99%   BMI 22.96 kg/m   9:16 PM Dr. Wilkie Aye has seen patient.  Current plan is to give another dose of pain medication and sent him home with some pain medication.  He has been unable to provide a stool sample thus far, will check 1 more time.  Per ID note, will avoid antidiarrheal medications.  No significant chest pain or shortness of breath that is new.  Low concern for ACS at this time.  11:06 PM Reassessment performed. Patient appears stable.   Labs personally reviewed and interpreted including: Stool studies sent and pending.   Reviewed pertinent lab work and imaging with patient at bedside. Questions answered.   Most current vital signs reviewed and are as follows: BP 123/81   Pulse 85   Temp 99.5 F (37.5 C) (Oral)   Resp 13   Ht 6\' 1"  (1.854 m)   Wt 78.9 kg   SpO2 100%   BMI 22.96 kg/m   Plan: Discharge to home.   Prescriptions written for: Percocet  Patient counseled on use of narcotic pain medications. Counseled not to combine these medications with others containing tylenol. Urged not to drink alcohol, drive, or perform any other activities that requires focus while taking these medications. The patient verbalizes understanding and agrees with the plan.  Other home care instructions discussed: Maintain good hydration and oral intake.   ED return instructions discussed: The patient was urged to return to the Emergency Department immediately with worsening of current symptoms, worsening abdominal pain, persistent vomiting, blood noted in stools,  fever, or any other concerns. The patient verbalized understanding.   Follow-up instructions discussed: Patient encouraged to follow-up  with their PCP in 3 days.                                 Medical Decision Making Amount and/or Complexity of Data Reviewed Labs: ordered. Radiology: ordered.  Risk Prescription drug management.   For this patient's complaint of abdominal pain, the following conditions were considered on the differential diagnosis: gastritis/PUD, enteritis/duodenitis, appendicitis, cholelithiasis/cholecystitis, cholangitis, pancreatitis, ruptured viscus, colitis, diverticulitis, small/large bowel obstruction, proctitis, cystitis, pyelonephritis, ureteral colic, aortic dissection, aortic aneurysm. Atypical chest etiologies were also considered including ACS, PE, and pneumonia.  CT scan reassuring despite high WBC. Pt clinically improved. Vitals look good. Hydrated well. Stool studies sent as precaution.   Abnormal EKG but pt with no new CP or SOB.  Do not suspect ACS, PE.  The patient's vital signs, pertinent lab work and imaging were reviewed and interpreted as discussed in the ED course. Hospitalization was considered for further testing, treatments, or serial exams/observation. However as patient is well-appearing, has a stable exam, and reassuring studies today, I do not feel that they warrant admission at this time. This plan was discussed with the patient who verbalizes agreement and comfort with this plan and seems reliable and able to return to the Emergency Department with worsening or changing symptoms.   11:40 PM Reviewed C. Diff screen -- was negative.        Final Clinical Impression(s) / ED Diagnoses Final diagnoses:  Diarrhea, unspecified type  Dehydration  Abnormal EKG    Rx / DC Orders ED Discharge Orders          Ordered    oxyCODONE-acetaminophen (PERCOCET/ROXICET) 5-325 MG tablet  Every 6 hours PRN        10/25/23 2304               Renne Crigler, PA-C 10/25/23 2309    Horton, Clabe Seal, DO 10/25/23 2325    Renne Crigler, PA-C 10/25/23 2340    Horton, Clabe Seal, DO 10/28/23 1616

## 2023-10-25 NOTE — ED Triage Notes (Signed)
 Pt sent to ED after seeing infectious disease MD due to concern for dehydration. Endorses N/V/D since Sunday and [place]. Pt able to tolerate some PO fluids.

## 2023-10-25 NOTE — Telephone Encounter (Signed)
 Per Dr. Luciana Axe pt needs to go to ED for evaluation. Spoke with patient who declines doing this at this time. Would prefer to come in to speak with Dr. Luciana Axe first.  Understands that if symptoms get worse to go straight to ED. Shawn [name], RMA

## 2023-10-25 NOTE — Assessment & Plan Note (Signed)
 Is having some improvement radiographically and therefore plan to have him stop the antibiotics he is currently on as planned on March 11 and have the PICC line removed to then.  Will notify home health to take care of that.  He is then to transition to oral cefadroxil and metronidazole and will give him that for about a month since he did have some remaining findings on the CT scan done last week. From an infectious disease standpoint, I agree that it is okay for him to start the immune modulator for his Crohn's disease when appropriate per GI.  I have personally spent 45 minutes involved in face-to-face and non-face-to-face activities for this patient on the day of the visit. Professional time spent includes the following activities: Preparing to see the patient (review of tests), Obtaining and/or reviewing separately obtained history (admission/discharge record), Performing a medically appropriate examination and/or evaluation , Ordering medications/tests/procedures, referring and communicating with other health care professionals, Documenting clinical information in the EMR, Independently interpreting results (not separately reported), Communicating results to the patient/family/caregiver, Counseling and educating the patient/family/caregiver and Care coordination (not separately reported).

## 2023-10-25 NOTE — Progress Notes (Signed)
   Subjective:    Patient ID: Shawn [name], male    DOB: May 28, 1986, 38 y.o.   MRN: [id number]  HPI Shawn [name] is here for follow-up of osteomyelitis. 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  He was seen in the ED about a week ago and had imaging there which showed some improvement in his abscesses.  His main complaint today is significant diarrhea associated with some fever and poor p.o. intake.  He also complains of significant tailbone pain at the site of his infection.   Review of Systems  Constitutional:  Positive for fever. Negative for fatigue.  Gastrointestinal:  Positive for diarrhea, nausea and rectal pain.       Objective:   Physical Exam Eyes:     General: No scleral icterus. Pulmonary:     Effort: Pulmonary effort is normal.  Neurological:     Mental Status: He is alert.   SH: no tobacco        Assessment & Plan:

## 2023-10-25 NOTE — Assessment & Plan Note (Signed)
 Currently reports significant diarrhea has been uncontrolled including unable to control his bowel movements.  He feels that all liquids he takes and goes straight through and has only been drinking water.  I did recommend he go to the emergency room or urgent care for evaluation due to significant dehydration though he declined this.  He also reports fever associated with these diarrhea.  This would not be consistent with C. difficile infection and I am more concerned with a viral gastroenteritis versus a flare of his Crohn's disease.  I would hold off on something like Imodium due to the uncertainty of the cause of this.  I again recommended evaluation in a setting where they have IV fluids but his blood pressure otherwise is stable here.  He will try supportive care at home.

## 2023-10-26 LAB — GASTROINTESTINAL PANEL BY PCR, STOOL (REPLACES STOOL CULTURE)

## 2023-10-28 DIAGNOSIS — M4628 Osteomyelitis of vertebra, sacral and sacrococcygeal region: Secondary | ICD-10-CM | POA: Diagnosis not present

## 2023-10-30 NOTE — Progress Notes (Addendum)
 [facility] Return Visit   Referring Provider No referring provider defined for this encounter.  Primary Care Provider Pcp, No  [name] Profile: Shawn [name] is a 38 y.o. male with a past medical history noteworthy for dilated cardiomyopathy, HTN, anemia, arthritis who returns to [name] for follow-up of Shawn problem(s) noted below.  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 Russella Dar.  I saw him in consult in Shawn [name] in January 2025.   Current GI Meds  Crohn's medications currently on hold in setting of osteomyelitis -plan for reinduction Stelara  Interval History  -- Since [name] discharge, Shawn [name] has had 2 ED visits  -- ED visit 10/18/2023 for worsening rectal/sacral pain -- Afebrile, white blood cell count 13.3 -- CTAP -improvement in multiple perianal sinus tracts and presacral fluid/small abscesses -- Discharge from ED to continue on antibiotics as prescribed by ID -daptomycin, cefepime and metronidazole  -- ED visit 10/25/2023 for diarrhea and dehydration after ID visit that day -- CTAP -seton intact, previously seen fluid collections continuing to improve without new fluid collections -- C. difficile and GI stool profile PCR negative  -- Today reports that his diarrhea has resolved -- Now back to having 1 bowel movement a day or skipping days between bowel movements -- Previously holding stool softeners/laxatives and now resuming them -- Continues to complain of ongoing tailbone/rectal pain -- Denies blood or mucus in stool -- Abdominal pain or cramping  --  Extraintestinal manifestations of IBD-fevers, chills, joint pain, skin rashes oral ulcers  -- Review of ID notes indicates plan to have PICC line removed on March 11 and subsequently transition to cefadroxil and metronidazole x 1 month -- I have communicated with Dr. Luciana Axe regarding resumption of Shawn [name] Stelara and he is in agreement that Shawn [name] is in a safe place to start this again -- Discussed Stelara with Shawn [name] during video visit today and he is comfortable restarting treatment.  Last colonoscopy:  03/2023 - colonoscopy showed diminished rectal inflammation now moderate in severity - Last endoscopy: None  Last Abd CT/CTE/MRE:  CTAP 10/25/2023 - improvement in multiple perianal sinus tracts and presacral fluid/small abscesses  GI Review of Symptoms Significant for rectal pain. Otherwise negative.  General Review of Systems  Review of systems is significant for Shawn pertinent positives and negatives as listed per Shawn HPI.  Full ROS is otherwise negative.  Inflammatory Bowel Disease History  05/2020-coccygeal pain, CT imaging demonstrating perirectal stranding 10/2020-right groin abscess ?  Diagnosis of hidradenitis suppurativa 11/2020-rectal pain, IDA and CT showing rectal wall stranding 12/2020-colonoscopy demonstrating rectal inflammation in Shawn distal, mid and proximal rectum; remainder of colon normal-pathology consistent with chronic colitis with focal activity in Shawn rectum 03/2021 - EUA with incision and drainage of left perianal abscess; diagnosed with coccygeal osteomyelitis around this time 06/2021 - modified Hanley procedure and coccygectomy 07/2021 - completed antibiotic treatment with cefdinir metronidazole; induction infliximab 5 mg/kg with maintenance every 8 weeks 12/2021 - recurrent perianal fistula and abscess 03/2022 - therapeutic drug monitoring demonstrated nil level less than 0.4, high antibodies 1358 08/2022 - induction Stelara followed by maintenance 90 mg  subcutaneously every 8 weeks 03/2023 - reports improved symptoms; colonoscopy showed diminished  rectal inflammation now moderate in severity --> Pt ceased Stelara of own volition 08/2022 - hospitalized [facility] with recurrent perianal abscesses and concern for sacral osteomyelitis --> treated with daptomycin, cefepime and metronidazole  IBD Medication History Infliximab -reported ineffective (? Dosing) Stelara -induction 08/2022 with preliminary response but [name] stopped medication prematurely  Past Medical History   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   PO B12 initiated 12/31/20   Anxiety    Arthritis    Chronic kidney disease    Crohn's colitis (HCC)    Dilated cardiomyopathy (HCC) 05/18/2013   By catheterization 2014    Fistula    GERD (gastroesophageal reflux disease)    Hypertension    Neuromuscular disorder (HCC)    Osteomyelitis (HCC)    tailbone     Past Surgical History   Past Surgical History:  Procedure Laterality Date   BIOPSY  01/01/2021   Procedure: BIOPSY;  Surgeon: Meryl Dare, MD;  Location: [facility] ENDOSCOPY;  Service: Endoscopy;;   COCCYGECTOMY  07/07/2021   COLONOSCOPY WITH PROPOFOL N/A 01/01/2021   Procedure: COLONOSCOPY WITH PROPOFOL;  Surgeon: Meryl Dare, MD;  Location: [facility] ENDOSCOPY;  Service: Endoscopy;  Laterality: N/A;   INCISION AND DRAINAGE ABSCESS N/A 07/07/2021   Procedure: MODIFIED HANLEY PROCEDURE, DRAINAGE WITH SETON PLACEMENT;  Surgeon: Karie Soda, MD;  Location: [facility] ORS;  Service: General;  Laterality: N/A;   INCISION AND DRAINAGE ABSCESS  05/04/2017   right groin   INCISION AND DRAINAGE PERIRECTAL ABSCESS  04/21/2021   INCISION AND DRAINAGE PERIRECTAL ABSCESS  05/18/2013   LEFT AND RIGHT HEART CATHETERIZATION WITH CORONARY ANGIOGRAM N/A 11/21/2012   Procedure: LEFT AND RIGHT HEART CATHETERIZATION WITH  CORONARY ANGIOGRAM;  Surgeon: Ricki Rodriguez, MD;  Location: [facility] CATH LAB;  Service: Cardiovascular;  Laterality: N/A;   MANDIBLE FRACTURE SURGERY  08/23/2004   PLACEMENT OF SETON N/A 07/07/2021   Procedure: PLACEMENT OF SETON;  Surgeon: Karie Soda, MD;  Location: [facility] ORS;  Service: General;  Laterality: N/A;   PLACEMENT OF SETON N/A 01/15/2022   Procedure: PLACEMENT OF SETON;  Surgeon: Karie Soda, MD;  Location: [facility] ORS;  Service: General;  Laterality: N/A;   RECTAL EXAM UNDER ANESTHESIA N/A 01/15/2022   Procedure: RECTAL EXAM UNDER ANESTHESIA;  Surgeon: Karie Soda, MD;  Location: [facility] ORS;  Service: General;  Laterality: N/A;  TRANSRECTAL DRAINAGE REPLACEMENT OF SETON EXCISION OF PERINEAL SINUS TRACT ANORECTAL EXAM UNDER ANESTHESIA     Allergies and Medications   Allergies  Allergen Reactions   Shrimp [Shellfish Allergy] Shortness Of Breath   Nsaids Other (See Comments)    CROHN'S DISEASE = NO NSAIDS    Current Meds  Medication Sig   acetaminophen (TYLENOL) 500 MG tablet Take 2 tablets (1,000 mg total) by mouth every 8 (eight) hours as needed for moderate pain. ([name] taking differently: Take 1,000 mg by mouth as needed for moderate pain (pain score 4-6).)   [EXPIRED] cefTRIAXone (ROCEPHIN) IVPB Inject 2 g into Shawn vein daily. Indication:  Osteomyelitis First Dose: Yes Last Day of Therapy:  11/01/23 Labs - Once weekly:  CBC/D and BMP, Labs - Once weekly: ESR and CRP Method of administration: IV Push Method of administration may be changed at Shawn discretion of home infusion pharmacist based upon assessment of Shawn [name] and/or caregiver's ability to self-administer Shawn medication ordered.   [EXPIRED] daptomycin (CUBICIN) IVPB Inject  700 mg into Shawn vein daily. Indication:  Osteomyelitis First Dose: Yes Last Day of Therapy:  11/01/23 Labs - Once weekly:  CBC/D, BMP, and CPK Labs - Once weekly: ESR and CRP Method of administration: IV Push Method of administration may be  changed at Shawn discretion of home infusion pharmacist based upon assessment of Shawn [name] and/or caregiver's ability to self-administer Shawn medication ordered.   doxycycline (VIBRAMYCIN) 100 MG capsule Take 1 capsule (100 mg total) by mouth 2 (two) times daily.   oxyCODONE-acetaminophen (PERCOCET/ROXICET) 5-325 MG tablet Take 1 tablet by mouth every 6 (six) hours as needed for severe pain (pain score 7-10).   senna (SENOKOT) 8.6 MG TABS tablet Take 2 tablets (17.2 mg total) by mouth daily. ([name] taking differently: Take 2 tablets by mouth as needed.)     Family History   Family History  Problem Relation Age of Onset   Diabetes Mother    Kidney failure Mother    Diabetes Sister    Colon cancer Neg Hx    Stomach cancer Neg Hx    Esophageal cancer Neg Hx    Pancreatic disease Neg Hx    Colon polyps Neg Hx    Rectal cancer Neg Hx     Social History   Social History   Tobacco Use   Smoking status: Former    Current packs/day: 0.25    Types: Cigarettes   Smokeless tobacco: Never  Vaping Use   Vaping status: Never Used  Substance Use Topics   Alcohol use: Not Currently    Comment: occ   Drug use: No   Shawn [name] reports that he has quit smoking. His smoking use included cigarettes. He has never used smokeless tobacco. He reports that he does not currently use alcohol. He reports that he does not use drugs.  Vital Signs and Physical Examination  No exam- video visit  Review of Data  Shawn following data was reviewed at Shawn time of this encounter:  Laboratory Studies      Latest Ref Rng & Units 10/25/2023   12:06 PM 10/18/2023   10:35 AM 10/04/2023    3:00 PM  CBC  WBC 4.0 - 10.5 K/uL 27.5  16.2  10.8   Hemoglobin 13.0 - 17.0 g/dL 9.4  8.6  60.4   Hematocrit 39.0 - 52.0 % 27.9  27.1  31.8   Platelets 150 - 400 K/uL 566  449  570.0     Lab Results  Component Value Date   LIPASE 62 (H) 10/25/2023      Latest Ref Rng & Units 10/25/2023   12:06 PM 10/18/2023   10:35 AM  10/04/2023    3:00 PM  CMP  Glucose 70 - 99 mg/dL 540  96  73   BUN 6 - 20 mg/dL 21  16  15    Creatinine 0.61 - 1.24 mg/dL 9.81  1.91  4.78   Sodium 135 - 145 mmol/L 130  135  138   Potassium 3.5 - 5.1 mmol/L 3.7  4.3  4.5   Chloride 98 - 111 mmol/L 101  110  108   CO2 22 - 32 mmol/L 13  15  21    Calcium 8.9 - 10.3 mg/dL 8.8  8.4  8.9   Total Protein 6.5 - 8.1 g/dL 8.1  7.6  8.9   Total Bilirubin 0.0 - 1.2 mg/dL 0.5  0.3  0.3   Alkaline Phos 38 - 126 U/L 86  91  119   AST 15 -  41 U/L 19  29  30    ALT 0 - 44 U/L 37  44  52    IBD Labs  Prebiologic Labs 08/2023 Hepatitis B surface antibody reactive Hepatitis B core antibody total negative Quantiferon gold negative  Therapeutic Drug Monitoring   Thiopurine metabolite levels:  Date:                6-TGN       6-MMP  Biologic level and antibodies:  Fecal Calprotectin   Imaging Studies  CTAP 3/4/30305 Shawn [name] is again noted in place. Previously seen fluid collections in Shawn pelvis have decreased in Shawn interval from Shawn prior exam now containing air consistent with continued improvement. No new fluid collection is identified.   Fluid is noted throughout Shawn colon consistent with Shawn diarrheal state.   Mild bibasilar atelectatic changes.  CTAP 10/18/2023 1. Overall interval improvement in Shawn multiple perianal sinus tracts, presacral fluid and small abscesses/collections when compared to Shawn prior exam from 09/23/2023. 2. Otherwise no acute inflammatory process identified within Shawn abdomen or pelvis. 3. There are patchy peripheral opacities throughout Shawn imaged bilateral lungs concerning for multilobar pneumonia. Follow-up to clearing is recommended. 4. Multiple other nonacute observations, as described above.  CTAP 09/27/2023 1. Persistent left-sided anorectal fistula with gas containing tracts extending into Shawn presacral space and bilateral gluteal musculature. Increased gas and decreased food within Shawn  tracts compared with 09/19/2023 likely secondary to aspiration performed 09/20/2023. Otherwise these are grossly similar to 09/19/2023 given differences in contrast timing. 2. Increased, moderate colonic stool burden. Correlate for constipation. No evidence of obstruction.   MRI Pelvis 09/21/2023 1. Enlarging presacral fluid collection with peripheral enhancement suspicious for an abscess. There is asymmetric extension of peripherally enhancing fluid into Shawn posterior aspect of Shawn left greater sciatic notch. 2. Increased marrow T2 hyperintensity and enhancement within Shawn adjacent mid to distal sacrum and coccyx suspicious for early osteomyelitis. 3. Presacral inflammatory changes extend into Shawn adjacent piriformis and gluteus maximus muscles bilaterally with associated small intramuscular fluid collections. 4. No evidence of sacroiliitis or hip joint septic arthritis.  In addition to Shawn perianal fistula with seton tube in place, there appears to be a fistula extending from Shawn left side of Shawn rectum (image [id number] series 13) at about Shawn 3 o'clock position approximately 5 cm cephalad to Shawn anorectal junction, and extending directly posteriorly into Shawn complex presacral collection. Shawn collection has small abscess like extensions extending along Shawn gluteal musculature. Multiple likely reactive perirectal lymph nodes are present.   There is primarily lower rectal diffuse wall thickening extending down to Shawn anorectal junction and probably into Shawn anus. Shawn upper rectum does not appear substantially thickened.  CTAP 08/22/2023 1. Decreased rectal mucosal thickening with mild residual peri-rectal stranding. 2. Small, LEFT-sided persistent anorectal fistula.  See key image. No focal drainable collection or abscess.    CTAP 8/11/20204 1. No pulmonary embolus. 2. No acute intrathoracic abnormality. 3. No acute intra-abdominal abnormality. 4. Persistent irregular left  posterior rectal wall thickening with associated Similar-appearing suprasphincteric left perianal fistula with posterior rectal inflammatory changes and seton in place. Findings are congruent with stable presacral soft tissue fat stranding and gas. No organized fluid collection. Rectal wall thickening likely reactive changes in Shawn setting of an anorectal fistula with however underlying malignancy not excluded. 5. Associated chronic destruction of Shawn coccyx with no new cortical erosion or obstruction. Findings suggestive of chronic osteomyelitis. If concern for acute osteomyelitis, consider MRI.  GI Procedures and  Studies  Colonoscopy 04/06/2023 - Abnormal perianal exam fistula with seton in place.  - Shawn examined portion of Shawn ileum was normal. Biopsied. - Crohn's disease with colonic involvement. Inflammation was found from Shawn anus to Shawn rectum. This was moderate in severity, improved compared to previous examinations. Biopsied.  - Shawn examination was otherwise normal on direct and retroflexion views.   Path 1. Surgical [P], small bowel, ileum ILEAL MUCOSA WITH PRESERVED VILLOGLANDULAR ARCHITECTURE WITHOUT INCREASED INTRAEPITHELIAL LYMPHOCYTES OR EVIDENCE OF ACTIVE INFLAMMATION. NEGATIVE FOR DYSPLASIA OR MALIGNANCY. 2. Surgical [P], random colon sites COLONIC MUCOSA WITH NO SIGNIFICANT DIAGNOSTIC ALTERATION. NO EVIDENCE OF LYMPHOCYTIC COLITIS OR COLLAGENOUS COLITIS. NO EVIDENCE OF ACTIVITY, CHRONICITY, GRANULOMA, DYSPLASIA OR MALIGNANCY. 3. Surgical [P], colon, rectum COLONIC MUCOSA WITH REACTIVE EPITHELIAL CHANGES AND LYMPHOID AGGREGATES. NEGATIVE FOR DYSPLASIA OR MALIGNANCY.   Colonoscopy 01/01/2021 - Shawn examined portion of Shawn ileum was normal.  - Congested, indurated, friable mucosa in Shawn rectum. Biopsied. Path A. RECTUM, BIOPSY:  -  Chronic colitis with focal activity  -  No granulomata, dysplasia or malignancy identified  -  See comment      Clinical Impression   It is my clinical impression that Shawn [name] is a 38 y.o. 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  In 10/2020 he developed a right groin abscess raising Shawn possibility of hidradenitis suppurativa.  Shawn following month he manifested worsening rectal pain, iron deficiency anemia and CT scan showing rectal wall stranding.  Colonoscopy 12/2020 demonstrated rectal inflammation in Shawn distal, mid and proximal rectum with Shawn remainder of Shawn rectum appearing normal.  In 03/2021 he underwent EUA with incision and drainage of a left perianal abscess and was diagnosed with coccygeal osteomyelitis.  He was managed with antibiotics and underwent modified Hanley procedure with coccygectomy 04/02/2021.    After an extended course of treatment with cefdinir and metronidazole he began induction infliximab 5 mg/kg every 8 weeks and 07/2021.Records indicate that despite infliximab treatment Shawn [name] continued to have recurrent perianal fistulas and abscess.  Therapeutic drug monitoring showed a nil infliximab level and high antibodies.    As such, he was transitioned to [facility] and received induction infusion 08/2022 followed by maintenance dosing with 90 mg subcutaneously every 8 weeks.  Shawn [name] recalls having a favorable response to Stelara treatment.  Restaging colonoscopy 03/2023 showed diminished rectal inflammation, now moderate in severity.  Rollin misunderstood instructions and thought because there was improvement in his inflammation he was able to stop Stelara and cease Shawn medication.  December 2024/January 2025 he presented with  recurrent perianal abscesses and presacral fluid collections.  He was ultimately admitted to [name] due to perianal sepsis and concern for recurrent sacral osteomyelitis.  He has completed an extended course of IV daptomycin, cefepime and metronidazole.  Most recent cross-sectional imaging 10/25/2023 has shown improvement in perianal abscesses and presacral fluid collection.  I have been in communication with his infectious disease provider, Dr. Luciana Axe, who is Shawn [name] that Shawn [name] is in a stable place to resume Stelara for management of his Crohn's disease.  Plan  Schedule Stelara reinduction infusion 520 mg IV x 1, then maintenance 90 mg subcutaneously q. 8 weeks Plan for quarterly immune suppressant monitoring labs: CBC,  CMP, ESR, CRP Schedule therapeutic drug monitoring prior to second subcutaneous injection Review option of iron infusions given ongoing anemia Consider future disease activity monitoring with periodic fecal calprotectin assessments Plan for restaging colonoscopy 6 to 12 months status post resumption of Stelara Monitor weight and anthropometrics   IBD Health Maintenance  Vaccinations Influenza: PCV13: PPSV23: COVID19: HAV/HBV: Shingles: HPV:  DEXA PRN  Eye Exam Annual eye exam  Skin Exam Annual skin exam  Surveillance Colonoscopy Limited proctitis; does not require intensive colonoscopic surveillance -anticipate restaging at least every 3 years  Tobacco Use   Depression Screen      Planned Follow Up 3-4 months  Shawn [name] or caregiver verbalized understanding of Shawn material covered, with no barriers to understanding. All questions were answered. [name] or caregiver is agreeable with Shawn plan outlined above.    It was a pleasure to see Shawn [name].  If you have any questions or concerns regarding this evaluation, do not hesitate to contact me.  Maren Beach, MD [facility]   This video encounter was conducted with Shawn [name]'s (or proxy's) verbal  consent via secure, interactive audio and video telecommunications while I was  in Shawn [place] [facility] office  Shawn [name] (or proxy) was instructed to have this encounter in a suitably private space and to only have persons present to whom they give permission to participate.  Shawn [name] confirmed he was in his home residence at Shawn time of this encounter.  In addition, [name] identity was confirmed by use of name plus an additional identifier.  This visit was coded based on MDM

## 2023-10-31 ENCOUNTER — Encounter: Payer: Self-pay | Admitting: Pediatrics

## 2023-10-31 ENCOUNTER — Telehealth: Payer: Medicaid Other | Admitting: Pediatrics

## 2023-10-31 VITALS — Ht 73.0 in | Wt 175.0 lb

## 2023-10-31 DIAGNOSIS — Z7960 Long term (current) use of unspecified immunomodulators and immunosuppressants: Secondary | ICD-10-CM | POA: Diagnosis not present

## 2023-10-31 DIAGNOSIS — M8660 Other chronic osteomyelitis, unspecified site: Secondary | ICD-10-CM

## 2023-10-31 DIAGNOSIS — D649 Anemia, unspecified: Secondary | ICD-10-CM

## 2023-10-31 DIAGNOSIS — K604 Rectal fistula, unspecified: Secondary | ICD-10-CM

## 2023-10-31 DIAGNOSIS — K50113 Crohn's disease of large intestine with fistula: Secondary | ICD-10-CM

## 2023-10-31 DIAGNOSIS — M1990 Unspecified osteoarthritis, unspecified site: Secondary | ICD-10-CM

## 2023-10-31 NOTE — Telephone Encounter (Signed)
 [place], will you please let me know if Stelara maintenance dose has been approved?

## 2023-11-01 ENCOUNTER — Encounter: Payer: Self-pay | Admitting: Pediatrics

## 2023-11-01 ENCOUNTER — Other Ambulatory Visit (HOSPITAL_COMMUNITY): Payer: Self-pay

## 2023-11-01 ENCOUNTER — Telehealth: Payer: Self-pay | Admitting: Pharmacy Technician

## 2023-11-01 MED ORDER — STELARA 90 MG/ML ~~LOC~~ SOSY
90.0000 mg | PREFILLED_SYRINGE | SUBCUTANEOUS | 5 refills | Status: DC
  Filled 2023-12-15: qty 1, 56d supply, fill #0

## 2023-11-01 NOTE — Telephone Encounter (Signed)
 Pharmacy Patient Advocate Encounter   Received notification from Physician's Office that prior authorization for [facility] 90MG  is required/requested.   Insurance verification completed.   The patient is insured through [facility] .   Per test claim: PA required; PA submitted to above mentioned insurance via CoverMyMeds Key/confirmation #/EOC [employer] Status is pending

## 2023-11-01 NOTE — Telephone Encounter (Signed)
 Shawn [name], please have staff contact patient to schedule Stelara induction infusions.   MyChart message sent to patient with information.

## 2023-11-01 NOTE — Telephone Encounter (Signed)
 Pharmacy Patient Advocate Encounter   Received notification from [facility] that Prior Authorization for [facility] 90MG  has been APPROVED from 3.11.25 to 3.11.26. Ran test claim, Copay is $4. This test claim was processed through [facility]- copay amounts may vary at other pharmacies due to pharmacy/plan contracts, or as the patient moves through the different stages of their insurance plan.    PA #/Case ID/Reference #: [id number]

## 2023-11-01 NOTE — Addendum Note (Signed)
 Addended by: Marisa Sprinkles on: 11/01/2023 12:50 PM   Modules accepted: Orders

## 2023-11-04 ENCOUNTER — Other Ambulatory Visit: Payer: Self-pay | Admitting: Pharmacy Technician

## 2023-11-04 ENCOUNTER — Other Ambulatory Visit (HOSPITAL_COMMUNITY): Payer: Self-pay

## 2023-11-07 NOTE — Telephone Encounter (Signed)
 Secure chat sent to Arminda Resides to follow up on scheduling patient for Stelara re-induction.

## 2023-11-07 NOTE — Telephone Encounter (Signed)
 Per team at [facility] on [address], we reached out to him both via phone and MyChart on 3/14. The patient read the MyChart message on 3/14, but has not called yet to schedule. Thanks! "  MyChart message sent to patient to contact infusion center.

## 2023-11-08 ENCOUNTER — Other Ambulatory Visit (HOSPITAL_COMMUNITY): Payer: Self-pay

## 2023-11-09 ENCOUNTER — Other Ambulatory Visit: Payer: Self-pay

## 2023-11-09 ENCOUNTER — Other Ambulatory Visit (HOSPITAL_COMMUNITY): Payer: Self-pay

## 2023-11-09 NOTE — Progress Notes (Signed)
 Specialty Pharmacy Initiation Note   Shawn [name] is a 39 y.o. male who will be followed by the specialty pharmacy service for RxSp Crohn's Disease    Review of administration, indication, effectiveness, safety, potential side effects, storage/disposable, and missed dose instructions occurred today for patient's specialty medication(s) Ustekinumab (Stelara)     Patient/Caregiver did not have any additional questions or concerns.   Patient's therapy is appropriate to: Initiate    Goals Addressed             This Visit's Progress    Minimize recurrence of flares       Patient is initiating therapy. Patient will maintain adherence         Otto Herb Specialty Pharmacist

## 2023-11-10 ENCOUNTER — Ambulatory Visit (HOSPITAL_BASED_OUTPATIENT_CLINIC_OR_DEPARTMENT_OTHER): Payer: Self-pay | Admitting: Family Medicine

## 2023-11-11 NOTE — Telephone Encounter (Signed)
 Patient is scheduled for Stelara infusion on 11/14/23 at 9:45 am. Called and spoke with [facility] at [facility]. Stelara maintenance dose prescription has been received. They have contacted and counseled patient on medication. They will reach back out to patient after his infusion.

## 2023-11-14 ENCOUNTER — Ambulatory Visit

## 2023-11-16 ENCOUNTER — Ambulatory Visit: Payer: Medicaid Other | Admitting: Physician Assistant

## 2023-11-22 ENCOUNTER — Ambulatory Visit (INDEPENDENT_AMBULATORY_CARE_PROVIDER_SITE_OTHER)

## 2023-11-22 VITALS — BP 98/52 | HR 65 | Temp 98.7°F | Resp 18 | Ht 73.0 in | Wt 176.4 lb

## 2023-11-22 DIAGNOSIS — K50019 Crohn's disease of small intestine with unspecified complications: Secondary | ICD-10-CM | POA: Diagnosis not present

## 2023-11-22 MED ORDER — USTEKINUMAB 130 MG/26ML IV SOLN
520.0000 mg | Freq: Once | INTRAVENOUS | Status: AC
  Administered 2023-11-22: 520 mg via INTRAVENOUS
  Filled 2023-11-22: qty 104

## 2023-11-22 NOTE — Progress Notes (Signed)
 Diagnosis: Crohn's Disease  Provider:  Chilton Greathouse MD  Procedure: IV Infusion  IV Type: Peripheral, IV Location: L Forearm  Stelera (Ustekinumab), Dose: 520 mg  Infusion Start Time: 1357  Infusion Stop Time: 1500  Post Infusion IV Care: Observation period completed and Peripheral IV Discontinued  Discharge: Condition: Good, Destination: Home . AVS Provided  Performed by:  Loney Hering, LPN

## 2023-11-23 ENCOUNTER — Ambulatory Visit: Admitting: Internal Medicine

## 2023-11-28 ENCOUNTER — Encounter (HOSPITAL_COMMUNITY): Payer: Self-pay

## 2023-11-28 ENCOUNTER — Emergency Department (HOSPITAL_COMMUNITY)
Admission: EM | Admit: 2023-11-28 | Discharge: 2023-11-28 | Disposition: A | Discharge status: Home / Self Care | Attending: Emergency Medicine | Admitting: Emergency Medicine

## 2023-11-28 ENCOUNTER — Other Ambulatory Visit: Payer: Self-pay

## 2023-11-28 DIAGNOSIS — G8929 Other chronic pain: Secondary | ICD-10-CM | POA: Diagnosis not present

## 2023-11-28 DIAGNOSIS — I129 Hypertensive chronic kidney disease with stage 1 through stage 4 chronic kidney disease, or unspecified chronic kidney disease: Secondary | ICD-10-CM | POA: Insufficient documentation

## 2023-11-28 DIAGNOSIS — M5441 Lumbago with sciatica, right side: Secondary | ICD-10-CM | POA: Diagnosis not present

## 2023-11-28 DIAGNOSIS — N189 Chronic kidney disease, unspecified: Secondary | ICD-10-CM | POA: Insufficient documentation

## 2023-11-28 DIAGNOSIS — M545 Low back pain, unspecified: Secondary | ICD-10-CM | POA: Diagnosis present

## 2023-11-28 MED ORDER — LIDOCAINE 5 % EX PTCH: 1.0000 | MEDICATED_PATCH | CUTANEOUS | 0 refills | Status: DC

## 2023-11-28 MED ORDER — HYDROCODONE-ACETAMINOPHEN 5-325 MG PO TABS
1.0000 | ORAL_TABLET | Freq: Once | ORAL | Status: AC
  Administered 2023-11-28: 1 via ORAL
  Filled 2023-11-28: qty 1

## 2023-11-28 MED ORDER — HYDROCODONE-ACETAMINOPHEN 5-325 MG PO TABS: 1.0000 | ORAL_TABLET | Freq: Four times a day (QID) | ORAL | 0 refills | Status: DC | PRN

## 2023-11-28 MED ORDER — CYCLOBENZAPRINE HCL 10 MG PO TABS: 10.0000 mg | ORAL_TABLET | Freq: Two times a day (BID) | ORAL | 0 refills | Status: DC | PRN

## 2023-11-28 MED ORDER — LIDOCAINE 5 % EX PTCH
1.0000 | MEDICATED_PATCH | CUTANEOUS | Status: DC
  Administered 2023-11-28: 1 via TRANSDERMAL
  Filled 2023-11-28: qty 1

## 2023-11-28 NOTE — Discharge Instructions (Signed)
 It was a pleasure taking part in your care.  As we discussed, I am sending you home with a small amount of pain medication, muscle relaxers and lidocaine patches.  Please take pain medication every 6 hours as needed for pain and low back.  Please be aware that pain medication will cause you to become lightheaded, drowsy.  Do not drive or operate heavy machinery while taking this medication, do not mix with alcohol.  Please also begin taking Flexeril, 10 mg, twice a day as needed for pain and low back.  You may also apply a Lidoderm patch to the right side of her low back for pain.  Please follow-up with your PCP.  Return to the ED with any new or worsening symptoms such as painful urination, fevers, bilateral continence, numbness in your groin or weakness of your lower extremities.

## 2023-11-28 NOTE — ED Provider Notes (Signed)
 [facility] AT [facility] Provider Note   CSN: [id number] Arrival date & time: 11/28/23  4098     History  Chief Complaint  Patient presents with   Back Pain    Lower back pain    Shawn [name] is a 38 y.o. male with medical history of CKD, Crohn's colitis, GERD, hypertension, neuromuscular disorder.  Patient presents to ED for evaluation of low back pain.  States that he has had chronic low back pain for some number of years now.  Reports that today his back pain increased.  Denies any preceding trauma or event to account for the increase in pain.  Denies bowel or bladder incontinence, groin numbness or distal weakness.  Reports has been taking Tylenol at home without full relief.  States he has Crohn's disease, unable to take NSAIDs.  Reports that he was set to see his PCP for his low back pain back in March but missed his appointment and is set to see his PCP on April 22.  Reports he works at a [employer], denies doing any heavy lifting.  Denies dysuria, flank pain, nausea, vomiting or fevers.  Reports that his back pain feels like his chronic back pain just worsened today.   Back Pain      Home Medications Prior to Admission medications   Medication Sig Start Date End Date Taking? Authorizing Provider  cyclobenzaprine (FLEXERIL) 10 MG tablet Take 1 tablet (10 mg total) by mouth 2 (two) times daily as needed for muscle spasms. 11/28/23  Yes Al Decant, PA-C  HYDROcodone-acetaminophen (NORCO/VICODIN) 5-325 MG tablet Take 1 tablet by mouth every 6 (six) hours as needed for severe pain (pain score 7-10). 11/28/23  Yes Delice Bison F, PA-C  lidocaine (LIDODERM) 5 % Place 1 patch onto the skin daily. Remove & Discard patch within 12 hours or as directed by MD 11/28/23  Yes Al Decant, PA-C  acetaminophen (TYLENOL) 500 MG tablet Take 2 tablets (1,000 mg total) by mouth every 8 (eight) hours as needed for moderate pain. Patient  taking differently: Take 1,000 mg by mouth as needed for moderate pain (pain score 4-6). 02/18/23   Glyn Ade, MD  doxycycline (VIBRAMYCIN) 100 MG capsule Take 1 capsule (100 mg total) by mouth 2 (two) times daily. 10/18/23   Prosperi, Christian H, PA-C  hydrocortisone (ANUSOL-HC) 25 MG suppository Unwrap and place 1 suppository (25 mg total) rectally at bedtime. Patient not taking: Reported on 10/31/2023 09/26/23   Laretta Bolster, MD  lidocaine (XYLOCAINE) 2 % jelly Apply 1 Application topically at bedtime. APPLY TO HYDROCORTISONE SUPPOSITORY FOR RECTAL PAIN. Patient not taking: Reported on 10/31/2023 09/26/23   Laretta Bolster, MD  metroNIDAZOLE (FLAGYL) 500 MG tablet Take 1 tablet (500 mg total) by mouth 2 (two) times daily. Patient not taking: Reported on 10/31/2023 11/02/23   Gardiner Barefoot, MD  oxyCODONE-acetaminophen (PERCOCET/ROXICET) 5-325 MG tablet Take 1 tablet by mouth every 6 (six) hours as needed for severe pain (pain score 7-10). 10/25/23   Renne Crigler, PA-C  senna (SENOKOT) 8.6 MG TABS tablet Take 2 tablets (17.2 mg total) by mouth daily. Patient taking differently: Take 2 tablets by mouth as needed. 09/27/23   Laretta Bolster, MD  ustekinumab (STELARA) 90 MG/ML SOSY injection Inject 1 mL (90 mg total) into the skin every 8 (eight) weeks. 11/01/23   McGreal, Durene Romans, MD      Allergies    Shrimp [shellfish allergy] and Nsaids    Review  of Systems   Review of Systems  Musculoskeletal:  Positive for back pain.  All other systems reviewed and are negative.   Physical Exam Updated Vital Signs Ht 6\' 1"  (1.854 m)   Wt 80 kg   BMI 23.27 kg/m  Physical Exam Vitals and nursing note reviewed.  Constitutional:      General: He is not in acute distress.    Appearance: He is well-developed.  HENT:     Head: Normocephalic and atraumatic.  Eyes:     Conjunctiva/sclera: Conjunctivae normal.  Cardiovascular:     Rate and Rhythm: Normal rate and regular rhythm.     Heart sounds: No  murmur heard. Pulmonary:     Effort: Pulmonary effort is normal. No respiratory distress.     Breath sounds: Normal breath sounds.  Abdominal:     Palpations: Abdomen is soft.     Tenderness: There is no abdominal tenderness. There is no right CVA tenderness or left CVA tenderness.  Musculoskeletal:        General: No swelling.     Cervical back: Neck supple.       Back:     Comments: No centralized spinal tenderness.  There is tenderness to the right portion of his lumbar spine.  No overlying skin change, step-off.  Positive straight leg raise test on right  Skin:    General: Skin is warm and dry.     Capillary Refill: Capillary refill takes less than 2 seconds.  Neurological:     Mental Status: He is alert and oriented to person, place, and time. Mental status is at baseline.     Comments: 5 out of 5 strength bilateral lower extremities  Psychiatric:        Mood and Affect: Mood normal.     ED Results / Procedures / Treatments   Labs (all labs ordered are listed, but only abnormal results are displayed) Labs Reviewed - No data to display  EKG None  Radiology No results found.  Procedures Procedures   Medications Ordered in ED Medications  HYDROcodone-acetaminophen (NORCO/VICODIN) 5-325 MG per tablet 1 tablet (has no administration in time range)  lidocaine (LIDODERM) 5 % 1 patch (has no administration in time range)    ED Course/ Medical Decision Making/ A&P  Medical Decision Making Risk Prescription drug management.   38 year old male with history of low back pain presents for evaluation of low back pain.  Please see HPI for further details.  On exam patient has a positive straight leg raise test on the right.  He has right sided paralumbar spinal tenderness but no centralized spinal tenderness.  No step-off or crepitus.  He has 5 out of 5 strength with bilateral lower extremities.  He is afebrile and nontachycardic.  Lung sounds are clear bilaterally, he is  not hypoxic.  Abdomen is soft and compressible.  Patient reports that he has a history of low back pain.  States this back pain is very similar to past instances.  States he has been unable to see his PCP for his back pain.  Reports unable to take NSAIDs secondary to Crohn's disease.  Will send patient home with very short amount of pain medication and advised him to continue taking Tylenol.  Will also send him with Flexeril, Lidoderm patches.  Have advised patient that ultimately he will need to follow-up with his PCP for further management and care of his chronic low back pain.  I advised the patient that if he develops any dysuria,  flank pain, nausea, vomiting or fevers he will need to return to the ED for further management.  I also advised patient if he develops bowel or bladder incontinence, groin numbness or weakness of his lower extremities he will need to return to the ED.  He voices understanding.  He had all of his questions answered his satisfaction.  He stable to discharge.   Final Clinical Impression(s) / ED Diagnoses Final diagnoses:  Chronic right-sided low back pain with right-sided sciatica    Rx / DC Orders ED Discharge Orders          Ordered    cyclobenzaprine (FLEXERIL) 10 MG tablet  2 times daily PRN        11/28/23 0118    HYDROcodone-acetaminophen (NORCO/VICODIN) 5-325 MG tablet  Every 6 hours PRN        11/28/23 0118    lidocaine (LIDODERM) 5 %  Every 24 hours        11/28/23 0118              Al Decant, PA-C 11/28/23 0119    Tilden Fossa, MD 11/28/23 [phone]

## 2023-11-28 NOTE — ED Triage Notes (Signed)
 Pt presents to ED with c/o lower back pain that has been chronic and worsened today. Usually takes tylenol for it but has not been working lately. Hx of sciatica.  Hasn't been able to see PCP.

## 2023-11-29 ENCOUNTER — Other Ambulatory Visit (HOSPITAL_COMMUNITY): Payer: Self-pay

## 2023-12-06 ENCOUNTER — Other Ambulatory Visit (HOSPITAL_COMMUNITY): Payer: Self-pay

## 2023-12-07 ENCOUNTER — Other Ambulatory Visit (HOSPITAL_COMMUNITY): Payer: Self-pay

## 2023-12-14 ENCOUNTER — Encounter (HOSPITAL_BASED_OUTPATIENT_CLINIC_OR_DEPARTMENT_OTHER): Payer: Self-pay | Admitting: Family Medicine

## 2023-12-14 ENCOUNTER — Ambulatory Visit (INDEPENDENT_AMBULATORY_CARE_PROVIDER_SITE_OTHER): Admitting: Family Medicine

## 2023-12-14 VITALS — BP 112/82 | HR 60 | Ht 73.0 in | Wt 168.8 lb

## 2023-12-14 DIAGNOSIS — E538 Deficiency of other specified B group vitamins: Secondary | ICD-10-CM

## 2023-12-14 DIAGNOSIS — M5440 Lumbago with sciatica, unspecified side: Secondary | ICD-10-CM

## 2023-12-14 DIAGNOSIS — M4628 Osteomyelitis of vertebra, sacral and sacrococcygeal region: Secondary | ICD-10-CM | POA: Diagnosis not present

## 2023-12-14 DIAGNOSIS — F331 Major depressive disorder, recurrent, moderate: Secondary | ICD-10-CM

## 2023-12-14 DIAGNOSIS — G8929 Other chronic pain: Secondary | ICD-10-CM

## 2023-12-14 DIAGNOSIS — N1831 Chronic kidney disease, stage 3a: Secondary | ICD-10-CM

## 2023-12-14 DIAGNOSIS — Z716 Tobacco abuse counseling: Secondary | ICD-10-CM

## 2023-12-14 MED ORDER — OXYCODONE-ACETAMINOPHEN 5-325 MG PO TABS: 1.0000 | ORAL_TABLET | Freq: Three times a day (TID) | ORAL | 0 refills | Status: DC | PRN

## 2023-12-14 MED ORDER — BUPROPION HCL ER (SR) 150 MG PO TB12: 150.0000 mg | ORAL_TABLET | Freq: Two times a day (BID) | ORAL | 3 refills | Status: DC
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
## 2023-12-15 ENCOUNTER — Other Ambulatory Visit: Payer: Self-pay

## 2023-12-15 ENCOUNTER — Other Ambulatory Visit (HOSPITAL_COMMUNITY): Payer: Self-pay

## 2023-12-15 ENCOUNTER — Encounter (HOSPITAL_BASED_OUTPATIENT_CLINIC_OR_DEPARTMENT_OTHER): Payer: Self-pay | Admitting: Family Medicine

## 2023-12-15 DIAGNOSIS — F331 Major depressive disorder, recurrent, moderate: Secondary | ICD-10-CM | POA: Insufficient documentation

## 2023-12-15 LAB — CBC WITH DIFFERENTIAL/PLATELET
Basophils Absolute: 0.1 10*3/uL (ref 0.0–0.2)
Basos: 1 %
EOS (ABSOLUTE): 0.6 10*3/uL — ABNORMAL HIGH (ref 0.0–0.4)
Eos: 6 %
Hematocrit: 32.5 % — ABNORMAL LOW (ref 37.5–51.0)
Hemoglobin: 10.2 g/dL — ABNORMAL LOW (ref 13.0–17.7)
Immature Grans (Abs): 0 10*3/uL (ref 0.0–0.1)
Immature Granulocytes: 0 %
Lymphocytes Absolute: 2.8 10*3/uL (ref 0.7–3.1)
Lymphs: 29 %
MCH: 27.6 pg (ref 26.6–33.0)
MCHC: 31.4 g/dL — ABNORMAL LOW (ref 31.5–35.7)
MCV: 88 fL (ref 79–97)
Monocytes Absolute: 0.5 10*3/uL (ref 0.1–0.9)
Monocytes: 5 %
Neutrophils Absolute: 5.7 10*3/uL (ref 1.4–7.0)
Neutrophils: 59 %
Platelets: 523 10*3/uL — ABNORMAL HIGH (ref 150–450)
RBC: 3.69 x10E6/uL — ABNORMAL LOW (ref 4.14–5.80)
RDW: 19.4 % — ABNORMAL HIGH (ref 11.6–15.4)
WBC: 9.7 10*3/uL (ref 3.4–10.8)

## 2023-12-15 LAB — BASIC METABOLIC PANEL WITH GFR
BUN/Creatinine Ratio: 14 (ref 9–20)
BUN: 20 mg/dL (ref 6–20)
CO2: 16 mmol/L — ABNORMAL LOW (ref 20–29)
Calcium: 9.1 mg/dL (ref 8.7–10.2)
Chloride: 107 mmol/L — ABNORMAL HIGH (ref 96–106)
Creatinine, Ser: 1.44 mg/dL — ABNORMAL HIGH (ref 0.76–1.27)
Glucose: 92 mg/dL (ref 70–99)
Potassium: 5.1 mmol/L (ref 3.5–5.2)
Sodium: 139 mmol/L (ref 134–144)
eGFR: 63 mL/min/{1.73_m2} (ref >59)

## 2023-12-15 LAB — VITAMIN B12: Vitamin B-12: 366 pg/mL (ref 232–1245)

## 2023-12-15 NOTE — Progress Notes (Signed)
 Renal function is stable. Electrolytes unremarkable and glucose WNL. Chronic Anemia due to Ckd is stable. WBC has returned to baseline.

## 2023-12-15 NOTE — Progress Notes (Signed)
 Specialty Pharmacy Initial Fill Coordination Note  Shawn [name] is a 38 y.o. male contacted today regarding initial fill of specialty medication(s) Ustekinumab  (Stelara )   Patient requested Delivery   Delivery date: 12/20/23   Verified address: [address]   [place] [address]   Medication will be filled on 4.28.25.   Patient is aware of $4 copayment.

## 2023-12-15 NOTE — Progress Notes (Signed)
 Initial fill has been completed in [place].

## 2023-12-29 ENCOUNTER — Encounter: Payer: Self-pay | Admitting: Physician Assistant

## 2023-12-29 ENCOUNTER — Ambulatory Visit: Admitting: Physician Assistant

## 2023-12-29 ENCOUNTER — Other Ambulatory Visit (INDEPENDENT_AMBULATORY_CARE_PROVIDER_SITE_OTHER): Payer: Self-pay

## 2023-12-29 DIAGNOSIS — M5442 Lumbago with sciatica, left side: Secondary | ICD-10-CM

## 2023-12-29 DIAGNOSIS — G8929 Other chronic pain: Secondary | ICD-10-CM

## 2023-12-29 DIAGNOSIS — M5441 Lumbago with sciatica, right side: Secondary | ICD-10-CM

## 2023-12-29 MED ORDER — ACETAMINOPHEN-CODEINE 300-30 MG PO TABS: 1.0000 | ORAL_TABLET | Freq: Four times a day (QID) | ORAL | 0 refills | Status: DC | PRN
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
## 2024-01-09 ENCOUNTER — Other Ambulatory Visit: Payer: 59

## 2024-01-09 ENCOUNTER — Encounter: Payer: Self-pay | Admitting: Family Medicine

## 2024-01-10 ENCOUNTER — Ambulatory Visit (HOSPITAL_BASED_OUTPATIENT_CLINIC_OR_DEPARTMENT_OTHER): Admitting: Family Medicine

## 2024-01-17 NOTE — Progress Notes (Unsigned)
 [facility] Return Visit   Referring Provider No referring provider defined for this encounter.  Primary Care Provider Caudle, Arcola Kocher, FNP  Patient Profile: Shawn [name] is a 38 y.o. male with a past medical history noteworthy for dilated cardiomyopathy, HTN, anemia, arthritis who returns to the [facility] for follow-up of the problem(s) noted below.  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  History of vitamin B12 deficiency Vitamin D deficiency   History of Present Illness   Shawn [name] was last seen in the GI office 10/31/2023 via video visit   Current GI Meds  Stelara  reinduction infusion 520 mg IV x 1 11/2023 -now on maintenance 90 mg subcutaneously every 8 weeks During interview today it appears patient took first maintenance dose 5 weeks after Stelara  induction infusion  Interval History  -- Completed antibiotics with infectious disease for sacral osteomyelitis:  IV daptomycin , cefepime  and metronidazole ; cefadroxil  and metronidazole  x 1 month  -- Stelara  re-induction infusion 520 mg administered 11/22/2023 -- States he took his first maintenance dose in early May 2025 at ?  5-week interval  -- Reports that his perianal discomfort/back pain is static-no better or worse -- Feels that there has been a decrease in drainage from his seton  -- Having 1 formed bowel movement a day without blood or mucus -- No longer needing to use laxatives -- Has mild discomfort with defecation but no severe pain -- No abdominal pain or cramping  -- No extraintestinal manifestations of IBD-fevers, chills, joint pain, skin rashes oral ulcers -- Denies any other intercurrent infectious  illnesses since initiating Stelara  treatment  -- Patient states that he has an ID appointment in the future but I do not see one scheduled -will remind him to schedule this to determine timing of follow-up imaging  Last colonoscopy:  03/2023 - colonoscopy showed diminished rectal inflammation now moderate in severity - Last endoscopy: None  Last Abd CT/CTE/MRE:  CTAP 10/25/2023 - improvement in multiple perianal sinus tracts and presacral fluid/small abscesses  GI Review of Symptoms Significant for rectal pain. Otherwise negative.  General Review of Systems  Review of systems is significant for the pertinent positives and negatives as listed per the HPI.  Full ROS is otherwise negative.  Inflammatory Bowel Disease History  05/2020-coccygeal pain, CT imaging demonstrating perirectal stranding 10/2020-right groin abscess ?  Diagnosis of hidradenitis suppurativa 11/2020-rectal pain, IDA and CT showing rectal wall stranding 12/2020-colonoscopy demonstrating rectal inflammation in the distal, mid and proximal rectum; remainder of colon normal-pathology consistent with chronic colitis with focal activity in the rectum 03/2021 - EUA with incision and drainage of left perianal abscess; diagnosed with coccygeal osteomyelitis around this time 06/2021 - modified Hanley procedure and coccygectomy 07/2021 - completed antibiotic treatment with cefdinir  metronidazole ; induction infliximab  5 mg/kg with maintenance every 8 weeks 12/2021 - recurrent perianal fistula and abscess 03/2022 - therapeutic drug monitoring demonstrated nil level less than 0.4, high antibodies 1358 08/2022 - induction Stelara  followed by maintenance 90 mg subcutaneously every 8 weeks 03/2023 - reports improved symptoms; colonoscopy showed diminished rectal inflammation now moderate in severity --> Pt ceased Stelara  of own volition 08/2022 - hospitalized [facility] with recurrent perianal abscesses and concern for sacral osteomyelitis -->  treated with daptomycin , cefepime  and metronidazole  followed by 1 month of cefadroxil  and metronidazole  11/2023 -Stelara   reinduction  IBD Medication History Infliximab  -reported ineffective (? Dosing) Stelara  -induction 08/2022 with preliminary response but patient stopped medication prematurely; reinduction 11/2023  Past Medical History   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   PO B12 initiated 12/31/20   Anxiety    Arthritis    Chronic kidney disease    Crohn's colitis (HCC)    Depression    Dilated cardiomyopathy (HCC) 05/18/2013   By catheterization 2014    Fistula    GERD (gastroesophageal reflux disease)    Hypertension    Neuromuscular disorder (HCC)    Osteomyelitis (HCC)    tailbone   Pneumonia      Past Surgical History   Past Surgical History:  Procedure Laterality Date   BIOPSY  01/01/2021   Procedure: BIOPSY;  Surgeon: Asencion Blacksmith, MD;  Location: [facility] ENDOSCOPY;  Service: Endoscopy;;   COCCYGECTOMY  07/07/2021   COLONOSCOPY WITH PROPOFOL  N/A 01/01/2021   Procedure: COLONOSCOPY WITH PROPOFOL ;  Surgeon: Asencion Blacksmith, MD;  Location: [facility] ENDOSCOPY;  Service: Endoscopy;  Laterality: N/A;   INCISION AND DRAINAGE ABSCESS N/A 07/07/2021   Procedure: MODIFIED HANLEY PROCEDURE, DRAINAGE WITH SETON PLACEMENT;  Surgeon: Candyce Champagne, MD;  Location: [facility] ORS;  Service: General;  Laterality: N/A;   INCISION AND DRAINAGE ABSCESS  05/04/2017   right groin   INCISION AND DRAINAGE PERIRECTAL ABSCESS  04/21/2021   INCISION AND DRAINAGE PERIRECTAL ABSCESS  05/18/2013   LEFT AND RIGHT HEART CATHETERIZATION WITH CORONARY ANGIOGRAM N/A 11/21/2012   Procedure: LEFT AND RIGHT HEART CATHETERIZATION WITH CORONARY ANGIOGRAM;  Surgeon: Darrold Emms, MD;  Location: [facility] CATH LAB;  Service: Cardiovascular;  Laterality: N/A;   MANDIBLE FRACTURE SURGERY   08/23/2004   PLACEMENT OF SETON N/A 07/07/2021   Procedure: PLACEMENT OF SETON;  Surgeon: Candyce Champagne, MD;  Location: [facility] ORS;  Service: General;  Laterality: N/A;   PLACEMENT OF SETON N/A 01/15/2022   Procedure: PLACEMENT OF SETON;  Surgeon: Candyce Champagne, MD;  Location: [facility] ORS;  Service: General;  Laterality: N/A;   RECTAL EXAM UNDER ANESTHESIA N/A 01/15/2022   Procedure: RECTAL EXAM UNDER ANESTHESIA;  Surgeon: Candyce Champagne, MD;  Location: [facility] ORS;  Service: General;  Laterality: N/A;  TRANSRECTAL DRAINAGE REPLACEMENT OF SETON EXCISION OF PERINEAL SINUS TRACT ANORECTAL EXAM UNDER ANESTHESIA     Allergies and Medications   Allergies  Allergen Reactions   Shrimp [Shellfish Allergy] Shortness Of Breath   Nsaids Other (See Comments)    CROHN'S DISEASE = NO NSAIDS    No outpatient medications have been marked as taking for the 01/18/24 encounter (Appointment) with Truddie Furrow, MD.     Family History   Family History  Problem Relation Age of Onset   Diabetes Mother    Kidney failure Mother    Diabetes Sister    Colon cancer Neg Hx    Stomach cancer Neg Hx    Esophageal cancer Neg Hx    Pancreatic disease Neg Hx    Colon polyps Neg Hx    Rectal cancer Neg Hx     Social History   Social History   Tobacco Use   Smoking status: Former    Current packs/day: 0.25    Types: Cigarettes   Smokeless tobacco: Never  Vaping Use   Vaping status: Never Used  Substance Use Topics   Alcohol use: Not Currently  Comment: occ   Drug use: No   Shawn [name] reports that he has quit smoking. His smoking use included cigarettes. He has never used smokeless tobacco. He reports that he does not currently use alcohol. He reports that he does not use drugs.  Vital Signs and Physical Examination   Vitals:   01/18/24 1007  BP: 118/62  Pulse: 68   Body mass index is 23.35 kg/m. Weight: 177 lb (80.3 kg)  General: Well developed, well nourished, no acute distress Head:  Normocephalic and atraumatic Eyes: Sclerae anicteric, EOMI Lungs: Clear throughout to auscultation Heart: Regular rate and rhythm; No murmurs, rubs or bruits Abdomen: Soft, non tender and non distended. No masses, hepatosplenomegaly or hernias noted. Normal Bowel sounds Rectal: Significant scarring in the perianal region, seton intact posteriorly without drainage, no fluctuance or abscesses Musculoskeletal: Symmetrical with no gross deformities   Review of Data  The following data was reviewed at the time of this encounter:  Laboratory Studies      Latest Ref Rng & Units 12/14/2023    9:41 AM 10/25/2023   12:06 PM 10/18/2023   10:35 AM  CBC  WBC 3.4 - 10.8 x10E3/uL 9.7  27.5  16.2   Hemoglobin 13.0 - 17.7 g/dL 16.1  9.4  8.6   Hematocrit 37.5 - 51.0 % 32.5  27.9  27.1   Platelets 150 - 450 x10E3/uL 523  566  449     Lab Results  Component Value Date   LIPASE 62 (H) 10/25/2023      Latest Ref Rng & Units 12/14/2023    9:41 AM 10/25/2023   12:06 PM 10/18/2023   10:35 AM  CMP  Glucose 70 - 99 mg/dL 92  096  96   BUN 6 - 20 mg/dL 20  21  16    Creatinine 0.76 - 1.27 mg/dL 0.45  4.09  8.11   Sodium 134 - 144 mmol/L 139  130  135   Potassium 3.5 - 5.2 mmol/L 5.1  3.7  4.3   Chloride 96 - 106 mmol/L 107  101  110   CO2 20 - 29 mmol/L 16  13  15    Calcium  8.7 - 10.2 mg/dL 9.1  8.8  8.4   Total Protein 6.5 - 8.1 g/dL  8.1  7.6   Total Bilirubin 0.0 - 1.2 mg/dL  0.5  0.3   Alkaline Phos 38 - 126 U/L  86  91   AST 15 - 41 U/L  19  29   ALT 0 - 44 U/L  37  44    IBD Labs  Prebiologic Labs 08/2023 Hepatitis B surface antibody reactive Hepatitis B core antibody total negative Quantiferon gold negative  Therapeutic Drug Monitoring   Thiopurine metabolite levels:  Date:                6-TGN       6-MMP  Biologic level and antibodies:  Fecal Calprotectin   Imaging Studies  CTAP 3/4/30305 Shawn [name] is again noted in place. Previously seen fluid collections in the pelvis have  decreased in the interval from the prior exam now containing air consistent with continued improvement. No new fluid collection is identified.   Fluid is noted throughout the colon consistent with the diarrheal state.   Mild bibasilar atelectatic changes.  CTAP 10/18/2023 1. Overall interval improvement in the multiple perianal sinus tracts, presacral fluid and small abscesses/collections when compared to the prior exam from 09/23/2023. 2. Otherwise no acute inflammatory process identified within  the abdomen or pelvis. 3. There are patchy peripheral opacities throughout the imaged bilateral lungs concerning for multilobar pneumonia. Follow-up to clearing is recommended. 4. Multiple other nonacute observations, as described above.  CTAP 09/27/2023 1. Persistent left-sided anorectal fistula with gas containing tracts extending into the presacral space and bilateral gluteal musculature. Increased gas and decreased food within the tracts compared with 09/19/2023 likely secondary to aspiration performed 09/20/2023. Otherwise these are grossly similar to 09/19/2023 given differences in contrast timing. 2. Increased, moderate colonic stool burden. Correlate for constipation. No evidence of obstruction.   MRI Pelvis 09/21/2023 1. Enlarging presacral fluid collection with peripheral enhancement suspicious for an abscess. There is asymmetric extension of peripherally enhancing fluid into the posterior aspect of the left greater sciatic notch. 2. Increased marrow T2 hyperintensity and enhancement within the adjacent mid to distal sacrum and coccyx suspicious for early osteomyelitis. 3. Presacral inflammatory changes extend into the adjacent piriformis and gluteus maximus muscles bilaterally with associated small intramuscular fluid collections. 4. No evidence of sacroiliitis or hip joint septic arthritis.  In addition to the perianal fistula with seton tube in place, there appears to be  a fistula extending from the left side of the rectum (image [id number] series 13) at about the 3 o'clock position approximately 5 cm cephalad to the anorectal junction, and extending directly posteriorly into the complex presacral collection. The collection has small abscess like extensions extending along the gluteal musculature. Multiple likely reactive perirectal lymph nodes are present.   There is primarily lower rectal diffuse wall thickening extending down to the anorectal junction and probably into the anus. The upper rectum does not appear substantially thickened.  CTAP 08/22/2023 1. Decreased rectal mucosal thickening with mild residual peri-rectal stranding. 2. Small, LEFT-sided persistent anorectal fistula.  See key image. No focal drainable collection or abscess.    CTAP 8/11/20204 1. No pulmonary embolus. 2. No acute intrathoracic abnormality. 3. No acute intra-abdominal abnormality. 4. Persistent irregular left posterior rectal wall thickening with associated Similar-appearing suprasphincteric left perianal fistula with posterior rectal inflammatory changes and seton in place. Findings are congruent with stable presacral soft tissue fat stranding and gas. No organized fluid collection. Rectal wall thickening likely reactive changes in the setting of an anorectal fistula with however underlying malignancy not excluded. 5. Associated chronic destruction of the coccyx with no new cortical erosion or obstruction. Findings suggestive of chronic osteomyelitis. If concern for acute osteomyelitis, consider MRI.  GI Procedures and Studies  Colonoscopy 04/06/2023 - Abnormal perianal exam fistula with seton in place.  - The examined portion of the ileum was normal. Biopsied. - Crohn's disease with colonic involvement. Inflammation was found from the anus to the rectum. This was moderate in severity, improved compared to previous examinations. Biopsied.  - The examination was  otherwise normal on direct and retroflexion views.   Path 1. Surgical [P], small bowel, ileum ILEAL MUCOSA WITH PRESERVED VILLOGLANDULAR ARCHITECTURE WITHOUT INCREASED INTRAEPITHELIAL LYMPHOCYTES OR EVIDENCE OF ACTIVE INFLAMMATION. NEGATIVE FOR DYSPLASIA OR MALIGNANCY. 2. Surgical [P], random colon sites COLONIC MUCOSA WITH NO SIGNIFICANT DIAGNOSTIC ALTERATION. NO EVIDENCE OF LYMPHOCYTIC COLITIS OR COLLAGENOUS COLITIS. NO EVIDENCE OF ACTIVITY, CHRONICITY, GRANULOMA, DYSPLASIA OR MALIGNANCY. 3. Surgical [P], colon, rectum COLONIC MUCOSA WITH REACTIVE EPITHELIAL CHANGES AND LYMPHOID AGGREGATES. NEGATIVE FOR DYSPLASIA OR MALIGNANCY.   Colonoscopy 01/01/2021 - The examined portion of the ileum was normal.  - Congested, indurated, friable mucosa in the rectum. Biopsied. Path A. RECTUM, BIOPSY:  -  Chronic colitis with focal activity  -  No  granulomata, dysplasia or malignancy identified  -  See comment      Clinical Impression  It is my clinical impression that Shawn [name] is a 38 y.o. 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  In 10/2020 he developed a right groin abscess raising the possibility of hidradenitis suppurativa.  The following month he manifested worsening rectal pain, iron  deficiency anemia and CT scan showing rectal wall stranding.  Colonoscopy 12/2020 demonstrated rectal inflammation in the distal, mid and proximal rectum with the remainder of the rectum appearing normal.  In 03/2021 he underwent EUA with incision and drainage of a left perianal  abscess and was diagnosed with coccygeal osteomyelitis.  He was managed with antibiotics and underwent modified Hanley procedure with coccygectomy 04/02/2021.    After an extended course of treatment with cefdinir  and metronidazole  he began induction infliximab  5 mg/kg every 8 weeks and 07/2021.Records indicate that despite infliximab  treatment Shawn [name] continued to have recurrent perianal fistulas and abscess.  Therapeutic drug monitoring showed a nil infliximab  level and high antibodies.    As such, he was transitioned to Stelara  and received induction infusion 08/2022 followed by maintenance dosing with 90 mg subcutaneously every 8 weeks.  Shawn [name] recalls having a favorable response to Stelara  treatment.  Restaging colonoscopy 03/2023 showed diminished rectal inflammation, now moderate in severity.  Shawn [name] misunderstood instructions and thought because there was improvement in his inflammation he was able to stop Stelara  and cease the medication.  December 2024/January 2025 he presented with recurrent perianal abscesses and presacral fluid collections.  He was ultimately admitted to [facility] due to perianal sepsis and concern for recurrent sacral osteomyelitis.  He has completed an extended course of IV daptomycin , cefepime  and metronidazole  followed by 1 month of cefdinir  and metronidazole .  Most recent cross-sectional imaging 10/25/2023 has shown improvement in perianal abscesses and presacral fluid collection.  In collaboration with infectious disease, it was deemed safe to restart immune suppression with Stelara  in 11/2023.  He underwent reinduction infusion that month.  His maintenance dosing is 90 mg subcutaneously every 8 weeks.  He appears to have administered his first maintenance dose at 5 weeks.  He is now approximately 4 weeks status post that dose.  We discussed performing laboratory studies and therapeutic drug monitoring today.  I anticipate he will likely need high dose treatment with every 4  week dosing because of his severe perianal fistulizing disease.   Plan  Labs today: CBC, CMP, ESR, CRP, iron  panel, vitamin D, therapeutic drug monitoring Continue Stelara  90 mg subcutaneously -will assess therapeutic drug monitoring to determine dosing frequency-anticipate he will need every 4 week dosing in the future Review option of iron  infusions given ongoing anemia Consider future disease activity monitoring with periodic fecal calprotectin assessments Plan for restaging colonoscopy 6 to 12 months status post resumption of Stelara  Continue multidisciplinary collaboration with infectious disease Continue vitamin B-12 supplement Vitamin D is low on today's labs-prescribed vitamin D 50,000 international units once a week x 8 weeks, then vitamin D 1000 to 2000 international units daily Monitor weight and anthropometrics   IBD Health Maintenance  Vaccinations Influenza: PCV13: PPSV23: COVID19: HAV/HBV: Shingles: HPV:  DEXA PRN  Eye Exam Annual eye exam  Skin Exam Annual skin exam  Surveillance Colonoscopy Limited proctitis; does not require intensive colonoscopic surveillance -anticipate restaging at least every 3 years  Tobacco Use   Depression Screen      Planned Follow Up 4 months  The patient or caregiver verbalized understanding of the material covered, with no barriers to understanding. All questions were answered. Patient or caregiver is agreeable with the plan outlined above.    It was a pleasure to see Shawn [name].  If you have any questions or concerns regarding this evaluation, do not hesitate to contact me.  Eugenia Hess, MD [facility]

## 2024-01-18 ENCOUNTER — Ambulatory Visit: Admitting: Pediatrics

## 2024-01-18 ENCOUNTER — Other Ambulatory Visit (INDEPENDENT_AMBULATORY_CARE_PROVIDER_SITE_OTHER)

## 2024-01-18 ENCOUNTER — Ambulatory Visit: Payer: Self-pay | Admitting: Pediatrics

## 2024-01-18 ENCOUNTER — Encounter: Payer: Self-pay | Admitting: Pediatrics

## 2024-01-18 VITALS — BP 118/62 | HR 68 | Ht 73.0 in | Wt 177.0 lb

## 2024-01-18 DIAGNOSIS — Z8639 Personal history of other endocrine, nutritional and metabolic disease: Secondary | ICD-10-CM | POA: Diagnosis not present

## 2024-01-18 DIAGNOSIS — E559 Vitamin D deficiency, unspecified: Secondary | ICD-10-CM | POA: Diagnosis not present

## 2024-01-18 DIAGNOSIS — D649 Anemia, unspecified: Secondary | ICD-10-CM

## 2024-01-18 DIAGNOSIS — Z7960 Long term (current) use of unspecified immunomodulators and immunosuppressants: Secondary | ICD-10-CM

## 2024-01-18 DIAGNOSIS — K50919 Crohn's disease, unspecified, with unspecified complications: Secondary | ICD-10-CM

## 2024-01-18 DIAGNOSIS — K610 Anal abscess: Secondary | ICD-10-CM | POA: Diagnosis not present

## 2024-01-18 DIAGNOSIS — M8660 Other chronic osteomyelitis, unspecified site: Secondary | ICD-10-CM

## 2024-01-18 DIAGNOSIS — M533 Sacrococcygeal disorders, not elsewhere classified: Secondary | ICD-10-CM | POA: Diagnosis not present

## 2024-01-18 DIAGNOSIS — K50113 Crohn's disease of large intestine with fistula: Secondary | ICD-10-CM | POA: Diagnosis not present

## 2024-01-18 DIAGNOSIS — K5090 Crohn's disease, unspecified, without complications: Secondary | ICD-10-CM | POA: Diagnosis not present

## 2024-01-18 DIAGNOSIS — Z8739 Personal history of other diseases of the musculoskeletal system and connective tissue: Secondary | ICD-10-CM | POA: Diagnosis not present

## 2024-01-18 DIAGNOSIS — Z79891 Long term (current) use of opiate analgesic: Secondary | ICD-10-CM | POA: Diagnosis not present

## 2024-01-18 LAB — COMPREHENSIVE METABOLIC PANEL WITH GFR
ALT: 27 U/L (ref 0–53)
AST: 17 U/L (ref 0–37)
Albumin: 3.7 g/dL (ref 3.5–5.2)
Alkaline Phosphatase: 117 U/L (ref 39–117)
BUN: 18 mg/dL (ref 6–23)
CO2: 22 meq/L (ref 19–32)
Calcium: 8.8 mg/dL (ref 8.4–10.5)
Chloride: 112 meq/L (ref 96–112)
Creatinine, Ser: 1.25 mg/dL (ref 0.40–1.50)
GFR: 72.47 mL/min (ref >60.00)
Glucose, Bld: 89 mg/dL (ref 70–99)
Potassium: 4.3 meq/L (ref 3.5–5.1)
Sodium: 140 meq/L (ref 135–145)
Total Bilirubin: 0.2 mg/dL (ref 0.2–1.2)
Total Protein: 8.2 g/dL (ref 6.0–8.3)

## 2024-01-18 LAB — CBC WITH DIFFERENTIAL/PLATELET
Basophils Absolute: 0.1 10*3/uL (ref 0.0–0.1)
Basophils Relative: 1.3 % (ref 0.0–3.0)
Eosinophils Absolute: 0.6 10*3/uL (ref 0.0–0.7)
Eosinophils Relative: 7.7 % — ABNORMAL HIGH (ref 0.0–5.0)
HCT: 30.8 % — ABNORMAL LOW (ref 39.0–52.0)
Hemoglobin: 10 g/dL — ABNORMAL LOW (ref 13.0–17.0)
Lymphocytes Relative: 36.8 % (ref 12.0–46.0)
Lymphs Abs: 3.1 10*3/uL (ref 0.7–4.0)
MCHC: 32.4 g/dL (ref 30.0–36.0)
MCV: 83.1 fl (ref 78.0–100.0)
Monocytes Absolute: 0.4 10*3/uL (ref 0.1–1.0)
Monocytes Relative: 4.3 % (ref 3.0–12.0)
Neutro Abs: 4.2 10*3/uL (ref 1.4–7.7)
Neutrophils Relative %: 49.9 % (ref 43.0–77.0)
Platelets: 525 10*3/uL — ABNORMAL HIGH (ref 150.0–400.0)
RBC: 3.71 Mil/uL — ABNORMAL LOW (ref 4.22–5.81)
RDW: 22.6 % — ABNORMAL HIGH (ref 11.5–15.5)
WBC: 8.4 10*3/uL (ref 4.0–10.5)

## 2024-01-18 LAB — IBC + FERRITIN
Ferritin: 142.7 ng/mL (ref 22.0–322.0)
Iron: 47 ug/dL (ref 42–165)
Saturation Ratios: 22.1 % (ref 20.0–50.0)
TIBC: 212.8 ug/dL — ABNORMAL LOW (ref 250.0–450.0)
Transferrin: 152 mg/dL — ABNORMAL LOW (ref 212.0–360.0)

## 2024-01-18 LAB — VITAMIN D 25 HYDROXY (VIT D DEFICIENCY, FRACTURES): VITD: 11.09 ng/mL — ABNORMAL LOW (ref 30.00–100.00)

## 2024-01-18 LAB — C-REACTIVE PROTEIN: CRP: 1.9 mg/dL (ref 0.5–20.0)

## 2024-01-18 LAB — SEDIMENTATION RATE: Sed Rate: 57 mm/h — ABNORMAL HIGH (ref 0–15)

## 2024-01-18 MED ORDER — VITAMIN D (ERGOCALCIFEROL) 1.25 MG (50000 UNIT) PO CAPS: 50000.0000 [IU] | ORAL_CAPSULE | ORAL | 0 refills | Status: DC

## 2024-01-18 NOTE — Patient Instructions (Signed)
 Your provider has requested that you go to the basement level for lab work before leaving today. Press "B" on the elevator. The lab is located at the first door on the left as you exit the elevator.  Due to recent changes in healthcare laws, you may see the results of your imaging and laboratory studies on MyChart before your provider has had a chance to review them.  We understand that in some cases there may be results that are confusing or concerning to you. Not all laboratory results come back in the same time frame and the provider may be waiting for multiple results in order to interpret others.  Please give us  48 hours in order for your provider to thoroughly review all the results before contacting the office for clarification of your results.    Follow up in 4 months.  Thank you for entrusting me with your care and for choosing [facility],  Dr. Eugenia Hess   _______________________________________________________  If your blood pressure at your visit was 140/90 or greater, please contact your primary care physician to follow up on this.  _______________________________________________________  If you are age 67 or older, your body mass index should be between 23-30. Your Body mass index is 23.35 kg/m. If this is out of the aforementioned range listed, please consider follow up with your Primary Care Provider.  If you are age 45 or younger, your body mass index should be between 19-25. Your Body mass index is 23.35 kg/m. If this is out of the aformentioned range listed, please consider follow up with your Primary Care Provider.   ________________________________________________________  The [facility] providers would like to encourage you to use MYCHART to communicate with providers for non-urgent requests or questions.  Due to long hold times on the telephone, sending your provider a message by [facility] may be a faster and more efficient way to get a response.  Please  allow 48 business hours for a response.  Please remember that this is for non-urgent requests.  _______________________________________________________

## 2024-01-20 ENCOUNTER — Other Ambulatory Visit: Payer: Self-pay

## 2024-01-20 DIAGNOSIS — K50919 Crohn's disease, unspecified, with unspecified complications: Secondary | ICD-10-CM

## 2024-01-20 DIAGNOSIS — D649 Anemia, unspecified: Secondary | ICD-10-CM

## 2024-01-27 LAB — USTEKINUMAB AND ANTI-USTEK AB: Ustekinumab: 4.4 ug/mL

## 2024-01-31 ENCOUNTER — Other Ambulatory Visit (HOSPITAL_COMMUNITY): Payer: Self-pay

## 2024-01-31 ENCOUNTER — Encounter: Payer: Self-pay | Admitting: Pediatrics

## 2024-01-31 ENCOUNTER — Telehealth: Payer: Self-pay

## 2024-01-31 DIAGNOSIS — K50019 Crohn's disease of small intestine with unspecified complications: Secondary | ICD-10-CM

## 2024-01-31 MED ORDER — USTEKINUMAB 90 MG/ML ~~LOC~~ SOSY: 90.0000 mg | PREFILLED_SYRINGE | SUBCUTANEOUS | Status: DC

## 2024-01-31 NOTE — Telephone Encounter (Signed)
 Shawn Furrow, MD to [facility] Triage 01/31/24  1:15 PM Please send in a new prescription for patient's Stelara  and change prescription from 90 mg subcutaneously every 8 weeks to 90 mg subcutaneously every 4 weeks.  The rationale for dose adjustment is ongoing perianal fistulizing Crohn's disease and abno rmal laboratory studies despite current dosing with injections every 8 weeks.  Please let me know if there are any questions.

## 2024-01-31 NOTE — Telephone Encounter (Signed)
 PA request has been Submitted. New Encounter has been or will be created for follow up. For additional info see Pharmacy Prior Auth telephone encounter from 01-31-2024.

## 2024-01-31 NOTE — Telephone Encounter (Signed)
 See 6/10 TE for details

## 2024-01-31 NOTE — Telephone Encounter (Signed)
 Pharmacy Patient Advocate Encounter   Received notification from Pt Calls Messages that prior authorization for Stelara  90MG /ML syringes is required/requested.   Insurance verification completed.   The patient is insured through [facility] .   Per test claim: PA required; PA submitted to above mentioned insurance via CoverMyMeds Key/confirmation #/EOC [facility] Status is pending

## 2024-01-31 NOTE — Telephone Encounter (Signed)
 Please submit PA for Stelara  90 mg every 4 weeks. RX entered in [place]. Thanks

## 2024-02-01 NOTE — Telephone Encounter (Signed)
 My chart message sent to pt making pt aware.

## 2024-02-01 NOTE — Telephone Encounter (Signed)
 Pharmacy Patient Advocate Encounter  Received notification from [facility] that Prior Authorization for Stelara  90MG /ML syringes has been APPROVED from 01-31-2024 to 01-30-2025   PA #/Case ID/Reference #: [id number]

## 2024-02-02 ENCOUNTER — Other Ambulatory Visit: Payer: Self-pay

## 2024-02-03 ENCOUNTER — Other Ambulatory Visit (HOSPITAL_COMMUNITY): Payer: Self-pay

## 2024-02-03 ENCOUNTER — Other Ambulatory Visit: Payer: Self-pay

## 2024-02-03 MED ORDER — USTEKINUMAB 90 MG/ML ~~LOC~~ SOSY
90.0000 mg | PREFILLED_SYRINGE | SUBCUTANEOUS | 3 refills | Status: AC
Start: 2024-02-03 — End: ?
  Filled 2024-02-03 – 2024-02-06 (×2): qty 1, 28d supply, fill #0
  Filled 2024-03-05: qty 1, 28d supply, fill #1
  Filled 2024-03-29: qty 1, 28d supply, fill #2
  Filled 2024-04-25: qty 1, 28d supply, fill #3
  Filled 2024-05-21: qty 1, 28d supply, fill #4
  Filled 2024-06-14 (×2): qty 1, 28d supply, fill #5
  Filled 2024-07-12: qty 1, 28d supply, fill #6
  Filled 2024-08-06: qty 1, 28d supply, fill #7
  Filled 2024-08-29 – 2024-09-17 (×2): qty 1, 28d supply, fill #8

## 2024-02-03 MED ORDER — USTEKINUMAB 90 MG/ML ~~LOC~~ SOSY: 90.0000 mg | PREFILLED_SYRINGE | SUBCUTANEOUS | Status: DC

## 2024-02-03 NOTE — Telephone Encounter (Signed)
 Stelara  90 mg/ml has been sent to [facility]. Patient notified via MyChart

## 2024-02-06 ENCOUNTER — Other Ambulatory Visit: Payer: Self-pay

## 2024-02-07 ENCOUNTER — Other Ambulatory Visit: Payer: Self-pay

## 2024-02-07 ENCOUNTER — Encounter (INDEPENDENT_AMBULATORY_CARE_PROVIDER_SITE_OTHER): Payer: Self-pay

## 2024-02-07 ENCOUNTER — Other Ambulatory Visit (HOSPITAL_COMMUNITY): Payer: Self-pay

## 2024-02-07 NOTE — Progress Notes (Signed)
 Specialty Pharmacy Refill Coordination Note  Spoke with Shawn [name] (Self).   Shawn [name] is a 38 y.o. male contacted today regarding refills of specialty medication(s) Ustekinumab  (STELARA )  Injection date: 02/13/24.   Patient requested Delivery   Delivery date: 02/09/24   Verified address: [address]  [place] [address]  Medication will be filled on 02/08/24.

## 2024-02-14 ENCOUNTER — Encounter: Payer: Self-pay | Admitting: Nurse Practitioner

## 2024-02-14 ENCOUNTER — Inpatient Hospital Stay: Attending: Nurse Practitioner | Admitting: Nurse Practitioner

## 2024-02-14 ENCOUNTER — Inpatient Hospital Stay

## 2024-02-14 DIAGNOSIS — D509 Iron deficiency anemia, unspecified: Secondary | ICD-10-CM | POA: Diagnosis not present

## 2024-02-14 DIAGNOSIS — K5090 Crohn's disease, unspecified, without complications: Secondary | ICD-10-CM | POA: Insufficient documentation

## 2024-02-14 DIAGNOSIS — N189 Chronic kidney disease, unspecified: Secondary | ICD-10-CM | POA: Insufficient documentation

## 2024-02-14 DIAGNOSIS — D649 Anemia, unspecified: Secondary | ICD-10-CM

## 2024-02-14 DIAGNOSIS — D75839 Thrombocytosis, unspecified: Secondary | ICD-10-CM | POA: Insufficient documentation

## 2024-02-14 DIAGNOSIS — E538 Deficiency of other specified B group vitamins: Secondary | ICD-10-CM | POA: Diagnosis not present

## 2024-02-14 DIAGNOSIS — M869 Osteomyelitis, unspecified: Secondary | ICD-10-CM | POA: Diagnosis not present

## 2024-02-14 LAB — CBC WITH DIFFERENTIAL (CANCER CENTER ONLY)
Abs Immature Granulocytes: 0.06 10*3/uL (ref 0.00–0.07)
Basophils Absolute: 0.1 10*3/uL (ref 0.0–0.1)
Basophils Relative: 1 %
Eosinophils Absolute: 0.6 10*3/uL — ABNORMAL HIGH (ref 0.0–0.5)
Eosinophils Relative: 6 %
HCT: 32.7 % — ABNORMAL LOW (ref 39.0–52.0)
Hemoglobin: 10.9 g/dL — ABNORMAL LOW (ref 13.0–17.0)
Immature Granulocytes: 1 %
Lymphocytes Relative: 37 %
Lymphs Abs: 4.1 10*3/uL — ABNORMAL HIGH (ref 0.7–4.0)
MCH: 27.7 pg (ref 26.0–34.0)
MCHC: 33.3 g/dL (ref 30.0–36.0)
MCV: 83.2 fL (ref 80.0–100.0)
Monocytes Absolute: 0.4 10*3/uL (ref 0.1–1.0)
Monocytes Relative: 4 %
Neutro Abs: 5.7 10*3/uL (ref 1.7–7.7)
Neutrophils Relative %: 51 %
Platelet Count: 457 10*3/uL — ABNORMAL HIGH (ref 150–400)
RBC: 3.93 MIL/uL — ABNORMAL LOW (ref 4.22–5.81)
RDW: 21.3 % — ABNORMAL HIGH (ref 11.5–15.5)
WBC Count: 10.9 10*3/uL — ABNORMAL HIGH (ref 4.0–10.5)
nRBC: 0 % (ref 0.0–0.2)

## 2024-02-14 LAB — IRON AND IRON BINDING CAPACITY (CC-WL,HP ONLY)
Iron: 48 ug/dL (ref 45–182)
Saturation Ratios: 18 % (ref 17.9–39.5)
TIBC: 272 ug/dL (ref 250–450)
UIBC: 224 ug/dL (ref 117–376)

## 2024-02-14 LAB — RETIC PANEL
Immature Retic Fract: 35.9 % — ABNORMAL HIGH (ref 2.3–15.9)
RBC.: 3.95 MIL/uL — ABNORMAL LOW (ref 4.22–5.81)
Retic Count, Absolute: 58.9 10*3/uL (ref 19.0–186.0)
Retic Ct Pct: 1.5 % (ref 0.4–3.1)
Reticulocyte Hemoglobin: 34 pg (ref >27.9)

## 2024-02-14 LAB — VITAMIN B12: Vitamin B-12: 105 pg/mL — ABNORMAL LOW (ref 180–914)

## 2024-02-14 LAB — FOLATE: Folate: 9.4 ng/mL (ref >5.9)

## 2024-02-14 LAB — FERRITIN: Ferritin: 159 ng/mL (ref 24–336)

## 2024-02-14 LAB — TSH: TSH: 2 u[IU]/mL (ref 0.350–4.500)

## 2024-02-14 NOTE — Progress Notes (Addendum)
 [facility]   Telephone:([phone]) [phone] [phone]   Clinic New consult Note   Patient Care Team: Caudle, Thersia Bitters, FNP as PCP - General (Family Medicine) [name], MD as Consulting Physician (Colon and Rectal Surgery) Comer, Lamar ORN, MD as Consulting Physician (Infectious Diseases) McGreal, Inocente HERO, MD as Consulting Physician (Gastroenterology) 02/14/2024  CHIEF COMPLAINTS/PURPOSE OF CONSULTATION:  Anemia, referred by GI Dr. Inocente Hausen  HISTORY OF PRESENTING ILLNESS:  Shawn [name] 38 y.o. male with past medical history including CKD, Crohn's, B12 deficiency, osteomyelitis is here because of anemia.  First found to have anemia in 2014 which was mild and intermittent initially with Hgb in the 12-13 range but became persistent by 2021.  Diagnosed with IBD/Crohn's disease in 01/01/2021 on colonoscopy at which time he also had a rectal abscess and osteomyelitis; Hgb down to 8.5-9 at this time.  Further studies showed B12 of 56 on 07/08/2021, received injections but eventually transitioned to oral B12 every other day starting 08/2023.  Has never had blood transfusion or donation, oral or IV iron .  Recent labs 12/14/2023 showed B12 of 366, serum iron  47, transferrin low 152, 22% saturation, ferritin of 142.  WBC showed normal white count, Hgb 10.0 which is closer to baseline, platelet 525 which has been chronically elevated.   Socially, he is single no children.  Works a Psychologist, sport and exercise job, independent with ADLs and exercises.  Denies prior or family history of malignancy but there may be anemia.  Denies drug use, plans to quit smoking cigarettes soon, and has not had alcohol in a month, previously drank every other week but not much at a time.  Today he presents by himself.  Occasionally tired and weak and has been for the past few days.  He recovered from multiple infections where he lost weight in March and April.  Denies recurrent fever.  He has chronic pain at the  coccyx, up to 10 out of 10.  Has been referred to pain management at [facility].  Denies any bleeding.    MEDICAL HISTORY:  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   PO B12 initiated 12/31/20   Anxiety    Arthritis    Chronic kidney disease    Crohn's colitis (HCC)    Depression    Dilated cardiomyopathy (HCC) 05/18/2013   By catheterization 2014    Fistula    GERD (gastroesophageal reflux disease)    Hypertension    Neuromuscular disorder (HCC)    Osteomyelitis (HCC)    tailbone   Pneumonia     SURGICAL HISTORY: Past Surgical History:  Procedure Laterality Date   BIOPSY  01/01/2021   Procedure: BIOPSY;  Surgeon: Aneita Gwendlyn DASEN, MD;  Location: [facility] ENDOSCOPY;  Service: Endoscopy;;   COCCYGECTOMY  07/07/2021   COLONOSCOPY WITH PROPOFOL  N/A 01/01/2021   Procedure: COLONOSCOPY WITH PROPOFOL ;  Surgeon: Aneita Gwendlyn DASEN, MD;  Location: [facility] ENDOSCOPY;  Service: Endoscopy;  Laterality: N/A;   INCISION AND DRAINAGE ABSCESS N/A 07/07/2021   Procedure: MODIFIED HANLEY PROCEDURE, DRAINAGE WITH SETON PLACEMENT;  Surgeon: [name], MD;  Location: [facility] ORS;  Service: General;  Laterality: N/A;   INCISION AND DRAINAGE ABSCESS  05/04/2017   right groin   INCISION AND DRAINAGE PERIRECTAL ABSCESS  04/21/2021   INCISION AND DRAINAGE PERIRECTAL ABSCESS  05/18/2013   LEFT AND RIGHT HEART CATHETERIZATION WITH CORONARY ANGIOGRAM  N/A 11/21/2012   Procedure: LEFT AND RIGHT HEART CATHETERIZATION WITH CORONARY ANGIOGRAM;  Surgeon: Salena GORMAN Negri, MD;  Location: [facility] CATH LAB;  Service: Cardiovascular;  Laterality: N/A;   MANDIBLE FRACTURE SURGERY  08/23/2004   PLACEMENT OF SETON N/A 07/07/2021   Procedure: PLACEMENT OF SETON;  Surgeon: [name], MD;  Location: [facility] ORS;  Service: General;  Laterality: N/A;   PLACEMENT OF SETON N/A 01/15/2022   Procedure: PLACEMENT OF  SETON;  Surgeon: [name], MD;  Location: [facility] ORS;  Service: General;  Laterality: N/A;   RECTAL EXAM UNDER ANESTHESIA N/A 01/15/2022   Procedure: RECTAL EXAM UNDER ANESTHESIA;  Surgeon: [name], MD;  Location: [facility] ORS;  Service: General;  Laterality: N/A;  TRANSRECTAL DRAINAGE REPLACEMENT OF SETON EXCISION OF PERINEAL SINUS TRACT ANORECTAL EXAM UNDER ANESTHESIA    SOCIAL HISTORY: Social History   Socioeconomic History   Marital status: Single    Spouse name: Not on file   Number of children: Not on file   Years of education: Not on file   Highest education level: Associate degree: academic program  Occupational History   Not on file  Tobacco Use   Smoking status: Former    Current packs/day: 0.25    Types: Cigarettes   Smokeless tobacco: Never  Vaping Use   Vaping status: Never Used  Substance and Sexual Activity   Alcohol use: Not Currently    Comment: occ   Drug use: No   Sexual activity: Not on file  Other Topics Concern   Not on file  Social History Narrative   Not on file   Social Drivers of Health   Financial Resource Strain: Low Risk  (09/18/2023)   Overall Financial Resource Strain (CARDIA)    Difficulty of Paying Living Expenses: Not hard at all  Food Insecurity: Food Insecurity Present (02/14/2024)   Hunger Vital Sign    Worried About Running Out of Food in the Last Year: Sometimes true    Ran Out of Food in the Last Year: Sometimes true  Transportation Needs: No Transportation Needs (02/14/2024)   PRAPARE - Administrator, Civil Service (Medical): No    Lack of Transportation (Non-Medical): No  Physical Activity: Sufficiently Active (09/18/2023)   Exercise Vital Sign    Days of Exercise per Week: 7 days    Minutes of Exercise per Session: 30 min  Stress: Stress Concern Present (09/18/2023)   [employer] of Occupational Health - Occupational Stress Questionnaire    Feeling of Stress : Very much  Social Connections: Unknown  (09/18/2023)   Social Connection and Isolation Panel    Frequency of Communication with Friends and Family: Not on file    Frequency of Social Gatherings with Friends and Family: Not on file    Attends Religious Services: Never    Active Member of Clubs or Organizations: No    Attends Banker Meetings: Not on file    Marital Status: Never married  Recent Concern: Social Connections - Socially Isolated (09/18/2023)   Social Connection and Isolation Panel    Frequency of Communication with Friends and Family: Three times a week    Frequency of Social Gatherings with Friends and Family: Never    Attends Religious Services: Never    Database administrator or Organizations: No    Attends Banker Meetings: Not on file    Marital Status: Never married  Intimate Partner Violence: Not At Risk (02/14/2024)   Humiliation, Afraid, Rape, and  Kick questionnaire    Fear of Current or Ex-Partner: No    Emotionally Abused: No    Physically Abused: No    Sexually Abused: No    FAMILY HISTORY: Family History  Problem Relation Age of Onset   Diabetes Mother    Kidney failure Mother    Diabetes Sister    Colon cancer Neg Hx    Stomach cancer Neg Hx    Esophageal cancer Neg Hx    Pancreatic disease Neg Hx    Colon polyps Neg Hx    Rectal cancer Neg Hx     ALLERGIES:  is allergic to shrimp [shellfish allergy] and nsaids.  MEDICATIONS:  Current Outpatient Medications  Medication Sig Dispense Refill   acetaminophen  (TYLENOL ) 500 MG tablet Take 2 tablets (1,000 mg total) by mouth every 8 (eight) hours as needed for moderate pain. (Patient taking differently: Take 1,000 mg by mouth as needed for moderate pain (pain score 4-6).) 30 tablet 0   acetaminophen -codeine  (TYLENOL  #3) 300-30 MG tablet Take 1-2 tablets by mouth every 6 (six) hours as needed for moderate pain (pain score 4-6). 20 tablet 0   senna (SENOKOT) 8.6 MG TABS tablet Take 2 tablets (17.2 mg total) by mouth  daily. (Patient taking differently: Take 2 tablets by mouth as needed.) 120 tablet 0   ustekinumab  (STELARA ) 90 MG/ML SOSY injection Inject 1 mL (90 mg total) into the skin every 28 (twenty-eight) days. 3 mL 3   Vitamin D , Ergocalciferol , (DRISDOL ) 1.25 MG (50000 UNIT) CAPS capsule Take 1 capsule (50,000 Units total) by mouth every 7 (seven) days. 8 capsule 0   No current facility-administered medications for this visit.   Facility-Administered Medications Ordered in Other Visits  Medication Dose Route Frequency Provider Last Rate Last Admin   Chlorhexidine  Gluconate Cloth 2 % PADS 6 each  6 each Topical Once Gross, Elspeth, MD       And   Chlorhexidine  Gluconate Cloth 2 % PADS 6 each  6 each Topical Once Gross, Elspeth, MD       feeding supplement (ENSURE PRE-SURGERY) liquid 296 mL  296 mL Oral Once Sheldon Elspeth, MD        REVIEW OF SYSTEMS:   Constitutional: Denies fevers, chills or abnormal night sweats (+) fatigue Eyes: Denies blurriness of vision, double vision or watery eyes Ears, nose, mouth, throat, and face: Denies mucositis or sore throat Respiratory: Denies cough, dyspnea or wheezes Cardiovascular: Denies palpitation, chest discomfort or lower extremity swelling Gastrointestinal:  Denies nausea, heartburn or change in bowel habits (+) Crohn/IBD Skin: Denies abnormal skin rashes Lymphatics: Denies new lymphadenopathy or easy bruising Neurological:Denies numbness, tingling or new weaknesses Behavioral/Psych: Mood is stable, no new changes  All other systems were reviewed with the patient and are negative.  PHYSICAL EXAMINATION: ECOG PERFORMANCE STATUS: 1 - Symptomatic but completely ambulatory  Vitals:   02/14/24 1217  BP: 102/78  Pulse: 64  Resp: 17  Temp: 97.8 F (36.6 C)  SpO2: 99%   Filed Weights   02/14/24 1217  Weight: 178 lb 6.4 oz (80.9 kg)    GENERAL:alert, no distress and comfortable SKIN: no rashes or significant lesions EYES: sclera clear NECK:  Without mass LYMPH:  no palpable cervical, supraclavicular, or axillary lymphadenopathy LUNGS: clear to auscultation with normal breathing effort HEART: regular rate & rhythm, no lower extremity edema ABDOMEN:abdomen soft, non-tender and normal bowel sounds Musculoskeletal:no cyanosis of digits and no clubbing  PSYCH: alert & oriented x 3 with fluent speech NEURO: no  focal motor/sensory deficits  LABORATORY DATA:  I have reviewed the data as listed    Latest Ref Rng & Units 02/14/2024    2:06 PM 01/18/2024   10:56 AM 12/14/2023    9:41 AM  CBC  WBC 4.0 - 10.5 K/uL 10.9  8.4  9.7   Hemoglobin 13.0 - 17.0 g/dL 89.0  89.9  89.7   Hematocrit 39.0 - 52.0 % 32.7  30.8  32.5   Platelets 150 - 400 K/uL 457  525.0  523        Latest Ref Rng & Units 01/18/2024   10:56 AM 12/14/2023    9:41 AM 10/25/2023   12:06 PM  CMP  Glucose 70 - 99 mg/dL 89  92  868   BUN 6 - 23 mg/dL 18  20  21    Creatinine 0.40 - 1.50 mg/dL 8.74  8.55  8.11   Sodium 135 - 145 mEq/L 140  139  130   Potassium 3.5 - 5.1 mEq/L 4.3  5.1  3.7   Chloride 96 - 112 mEq/L 112  107  101   CO2 19 - 32 mEq/L 22  16  13    Calcium  8.4 - 10.5 mg/dL 8.8  9.1  8.8   Total Protein 6.0 - 8.3 g/dL 8.2   8.1   Total Bilirubin 0.2 - 1.2 mg/dL 0.2   0.5   Alkaline Phos 39 - 117 U/L 117   86   AST 0 - 37 U/L 17   19   ALT 0 - 53 U/L 27   37      RADIOGRAPHIC STUDIES: I have personally reviewed the radiological images as listed and agreed with the findings in the report. No results found.  ASSESSMENT & PLAN: 38 year old male  Microcytic anemia -We reviewed his medical record in detail with the patient.  He developed a mild intermittent anemia in 2014 that became persistent after 2021  -Other workup showed extremely low vitamin B12, no significant iron  or folate deficiencies - He ultimately developed colonic and severe perianal fistulizing Crohn's disease in 2021 complicated by recurrent perianal abscess and coccygeal osteomyelitis.   Required frequent antibiotics and deals with chronic pain -With recurrent abscess and pneumonia earlier this year anemia dropped to 8-9 range -Ferritin has been elevated at times, likely due to inflammation -No evidence of malignancy on CT from 10/2023 -Mr. Machuca is recovering from recent infections, today's Hgb is 10.9.  MCV is normal, so this is not thalassemia.  Mild thrombocytosis likely benign/reactive secondary to inflammation -Iron  studies are normal; will follow-up B12 to see if he needs to increase oral B12 from every other day -We discussed his anemia is multifactorial, secondary to chronic/recurrent infection, B12 deficiency, past alcohol use, CKD, and chronic disease.   -Strongly encouraged to abstain from alcohol and tobacco -Indolent MDS is less likely, we are not recommending a bone marrow biopsy at this time -Phone visit in a week to review labs, then lab/follow-up in 6 months. Will see him sooner if needed -Seen with Dr. Lanny   PLAN: -Medical record reviewed -Labs today, phone visit in 1 week -Lab and f/up in 6 months, or sooner if needed -Pt seen with Dr. Lanny  Orders Placed This Encounter  Procedures   CBC with Differential (Cancer Center Only)    Standing Status:   Future    Number of Occurrences:   1    Expiration Date:   02/13/2025   Ferritin    Standing Status:  Future    Number of Occurrences:   1    Expiration Date:   02/13/2025   Iron  and Iron  Binding Capacity ([facility] only)    Standing Status:   Future    Number of Occurrences:   1    Expiration Date:   02/13/2025   Methylmalonic acid, serum    Standing Status:   Future    Number of Occurrences:   1    Expiration Date:   02/13/2025   Vitamin B12    Standing Status:   Future    Number of Occurrences:   1    Expiration Date:   02/13/2025   Retic Panel    Standing Status:   Future    Number of Occurrences:   1    Expiration Date:   02/13/2025   Erythropoietin    Standing Status:   Future    Number  of Occurrences:   1    Expected Date:   02/14/2024    Expiration Date:   02/13/2025   Folate, Serum    Standing Status:   Future    Number of Occurrences:   1    Expected Date:   02/14/2024    Expiration Date:   02/13/2025   Testosterone, free, total    Standing Status:   Future    Number of Occurrences:   1    Expiration Date:   02/13/2025   Heavy metals, blood    Standing Status:   Future    Number of Occurrences:   1    Expiration Date:   02/13/2025    [place] of residence?:   [place] [[id number]]   TSH    Standing Status:   Future    Number of Occurrences:   1    Expiration Date:   02/13/2025      All questions were answered. The patient knows to call the clinic with any problems, questions or concerns.      Joda Braatz K Azavier Creson, NP 02/14/24   ADDENDUM I have seen the patient, examined him. I agree with the assessment and and plan and have edited the notes.   38 year old male with past medical history of Crohn's disease, B12 deficiency, CKD, osteomyelitis, was referred to us  for chronic anemia for the past 4 years.  He did have significant B12 level in the past, which probably contributes to his anemia.  He has been taking oral B12 every other day lately, last B12 level 2 months ago was in 300 range.  Reticulocyte count was low, his anemia is mainly anemia of chronic disease, secondary to Crohn disease and recurrent infection.  Will repeat lab today, including iron  study, B12, methylmalonic acid level, folate, testosterone, TSH, and heavy metal levels.  Due to his young age, MDS is unlikely.  I do not think he needs bone marrow biopsy.  If his B12 level is below 300, I will recommend a B12 injection.  Will call him next week with lab results, and the follow-up in 6 months. He has lab with GI every 2-3 months.  Onita Mattock MD 02/14/2024

## 2024-02-15 ENCOUNTER — Telehealth: Payer: Self-pay | Admitting: Nurse Practitioner

## 2024-02-15 LAB — HEAVY METALS, BLOOD
Arsenic: 2 ug/L (ref 0–9)
Lead: <1 ug/dL (ref 0.0–3.4)
Mercury: <1 ug/L (ref 0.0–14.9)

## 2024-02-15 LAB — ERYTHROPOIETIN: Erythropoietin: 21.7 m[IU]/mL — ABNORMAL HIGH (ref 2.6–18.5)

## 2024-02-15 NOTE — Telephone Encounter (Signed)
 Scheduled appointments per 6/24 los. Talked with the patient and he is aware of the made appointments.

## 2024-02-16 LAB — METHYLMALONIC ACID, SERUM: Methylmalonic Acid, Quantitative: 2627 nmol/L — ABNORMAL HIGH (ref 0–378)

## 2024-02-16 LAB — TESTOSTERONE, FREE, TOTAL, SHBG
Sex Hormone Binding: 40 nmol/L (ref 16.5–55.9)
Testosterone, Free: 16.5 pg/mL (ref 8.7–25.1)
Testosterone: 681 ng/dL (ref 264–916)

## 2024-02-18 ENCOUNTER — Encounter (HOSPITAL_BASED_OUTPATIENT_CLINIC_OR_DEPARTMENT_OTHER): Payer: Self-pay | Admitting: Family Medicine

## 2024-02-21 ENCOUNTER — Inpatient Hospital Stay: Attending: Nurse Practitioner | Admitting: Nurse Practitioner

## 2024-02-21 ENCOUNTER — Encounter: Payer: Self-pay | Admitting: Nurse Practitioner

## 2024-02-21 DIAGNOSIS — D649 Anemia, unspecified: Secondary | ICD-10-CM

## 2024-02-21 NOTE — Progress Notes (Signed)
 Pt not available at scheduled time for phone f/up. I will send Mychart message and attempt to reach him.   Malonie Tatum, NP

## 2024-02-22 ENCOUNTER — Other Ambulatory Visit: Payer: Self-pay | Admitting: Nurse Practitioner

## 2024-02-28 ENCOUNTER — Other Ambulatory Visit (HOSPITAL_BASED_OUTPATIENT_CLINIC_OR_DEPARTMENT_OTHER): Payer: Self-pay | Admitting: Family Medicine

## 2024-02-28 DIAGNOSIS — G8929 Other chronic pain: Secondary | ICD-10-CM

## 2024-02-28 DIAGNOSIS — M4628 Osteomyelitis of vertebra, sacral and sacrococcygeal region: Secondary | ICD-10-CM

## 2024-03-05 ENCOUNTER — Other Ambulatory Visit: Payer: Self-pay

## 2024-03-05 ENCOUNTER — Other Ambulatory Visit (HOSPITAL_COMMUNITY): Payer: Self-pay

## 2024-03-05 ENCOUNTER — Encounter (INDEPENDENT_AMBULATORY_CARE_PROVIDER_SITE_OTHER): Payer: Self-pay

## 2024-03-05 NOTE — Progress Notes (Signed)
 Specialty Pharmacy Refill Coordination Note  MyChart Questionnaire Submission  Shawn [name] is a 38 y.o. male contacted today regarding refills of specialty medication(s) Stelara   Injection date: (Patient-Rptd) 03/08/24    Patient requested: (Patient-Rptd) Delivery   Delivery date: 03/07/24   Verified address: [address] [place] [address]  Medication will be filled on 03/06/24.

## 2024-03-06 ENCOUNTER — Telehealth: Payer: Self-pay | Admitting: Nurse Practitioner

## 2024-03-06 NOTE — Telephone Encounter (Signed)
 Scheduled appointments per staff message. Called and left a VM with appointment details for the patient.

## 2024-03-08 ENCOUNTER — Encounter (HOSPITAL_BASED_OUTPATIENT_CLINIC_OR_DEPARTMENT_OTHER): Payer: Self-pay | Admitting: Family Medicine

## 2024-03-09 ENCOUNTER — Inpatient Hospital Stay

## 2024-03-10 ENCOUNTER — Encounter (HOSPITAL_COMMUNITY): Payer: Self-pay

## 2024-03-10 ENCOUNTER — Other Ambulatory Visit: Payer: Self-pay

## 2024-03-10 ENCOUNTER — Inpatient Hospital Stay (HOSPITAL_COMMUNITY)
Admission: EM | Admit: 2024-03-10 | Discharge: 2024-03-16 | DRG: 539 | Disposition: A | Discharge status: Home Health | Attending: Internal Medicine | Admitting: Internal Medicine

## 2024-03-10 DIAGNOSIS — Z91013 Allergy to seafood: Secondary | ICD-10-CM

## 2024-03-10 DIAGNOSIS — K651 Peritoneal abscess: Secondary | ICD-10-CM | POA: Diagnosis present

## 2024-03-10 DIAGNOSIS — I129 Hypertensive chronic kidney disease with stage 1 through stage 4 chronic kidney disease, or unspecified chronic kidney disease: Secondary | ICD-10-CM | POA: Diagnosis present

## 2024-03-10 DIAGNOSIS — K6289 Other specified diseases of anus and rectum: Secondary | ICD-10-CM | POA: Diagnosis not present

## 2024-03-10 DIAGNOSIS — Z604 Social exclusion and rejection: Secondary | ICD-10-CM | POA: Diagnosis present

## 2024-03-10 DIAGNOSIS — K5909 Other constipation: Secondary | ICD-10-CM | POA: Diagnosis present

## 2024-03-10 DIAGNOSIS — I420 Dilated cardiomyopathy: Secondary | ICD-10-CM | POA: Diagnosis present

## 2024-03-10 DIAGNOSIS — F40240 Claustrophobia: Secondary | ICD-10-CM | POA: Diagnosis present

## 2024-03-10 DIAGNOSIS — K50914 Crohn's disease, unspecified, with abscess: Secondary | ICD-10-CM | POA: Insufficient documentation

## 2024-03-10 DIAGNOSIS — D849 Immunodeficiency, unspecified: Secondary | ICD-10-CM | POA: Diagnosis present

## 2024-03-10 DIAGNOSIS — E875 Hyperkalemia: Secondary | ICD-10-CM | POA: Diagnosis present

## 2024-03-10 DIAGNOSIS — Z841 Family history of disorders of kidney and ureter: Secondary | ICD-10-CM

## 2024-03-10 DIAGNOSIS — G8929 Other chronic pain: Secondary | ICD-10-CM | POA: Diagnosis present

## 2024-03-10 DIAGNOSIS — E538 Deficiency of other specified B group vitamins: Secondary | ICD-10-CM | POA: Diagnosis present

## 2024-03-10 DIAGNOSIS — N289 Disorder of kidney and ureter, unspecified: Secondary | ICD-10-CM | POA: Diagnosis not present

## 2024-03-10 DIAGNOSIS — K603 Anal fistula, unspecified: Secondary | ICD-10-CM | POA: Diagnosis present

## 2024-03-10 DIAGNOSIS — M4628 Osteomyelitis of vertebra, sacral and sacrococcygeal region: Principal | ICD-10-CM | POA: Diagnosis present

## 2024-03-10 DIAGNOSIS — Z79620 Long term (current) use of immunosuppressive biologic: Secondary | ICD-10-CM

## 2024-03-10 DIAGNOSIS — Z833 Family history of diabetes mellitus: Secondary | ICD-10-CM

## 2024-03-10 DIAGNOSIS — K6819 Other retroperitoneal abscess: Secondary | ICD-10-CM | POA: Diagnosis present

## 2024-03-10 DIAGNOSIS — D649 Anemia, unspecified: Secondary | ICD-10-CM | POA: Diagnosis not present

## 2024-03-10 DIAGNOSIS — K50113 Crohn's disease of large intestine with fistula: Secondary | ICD-10-CM | POA: Diagnosis present

## 2024-03-10 DIAGNOSIS — K50913 Crohn's disease, unspecified, with fistula: Secondary | ICD-10-CM | POA: Insufficient documentation

## 2024-03-10 DIAGNOSIS — M869 Osteomyelitis, unspecified: Principal | ICD-10-CM

## 2024-03-10 DIAGNOSIS — Z933 Colostomy status: Secondary | ICD-10-CM

## 2024-03-10 DIAGNOSIS — K611 Rectal abscess: Secondary | ICD-10-CM | POA: Diagnosis present

## 2024-03-10 DIAGNOSIS — F17210 Nicotine dependence, cigarettes, uncomplicated: Secondary | ICD-10-CM | POA: Diagnosis present

## 2024-03-10 DIAGNOSIS — N182 Chronic kidney disease, stage 2 (mild): Secondary | ICD-10-CM | POA: Diagnosis present

## 2024-03-10 DIAGNOSIS — K610 Anal abscess: Secondary | ICD-10-CM

## 2024-03-10 DIAGNOSIS — L0231 Cutaneous abscess of buttock: Secondary | ICD-10-CM | POA: Diagnosis present

## 2024-03-10 DIAGNOSIS — Z5941 Food insecurity: Secondary | ICD-10-CM

## 2024-03-10 DIAGNOSIS — K629 Disease of anus and rectum, unspecified: Secondary | ICD-10-CM | POA: Diagnosis not present

## 2024-03-10 DIAGNOSIS — K50114 Crohn's disease of large intestine with abscess: Secondary | ICD-10-CM | POA: Diagnosis present

## 2024-03-10 DIAGNOSIS — D638 Anemia in other chronic diseases classified elsewhere: Secondary | ICD-10-CM | POA: Diagnosis present

## 2024-03-10 MED ORDER — MORPHINE SULFATE (PF) 4 MG/ML IV SOLN
4.0000 mg | INTRAVENOUS | Status: AC | PRN
  Administered 2024-03-10 – 2024-03-11 (×3): 4 mg via INTRAVENOUS
  Filled 2024-03-10 (×4): qty 1

## 2024-03-10 NOTE — ED Triage Notes (Addendum)
 Pt came in via EMS w/ c/o of severe pain due anal fistula in which he receives monthly stelara  injections. Pt states he was not provided any pain management meds and pain has become unbearable tonight. Denies bloody stools.

## 2024-03-10 NOTE — ED Provider Notes (Signed)
 [place] EMERGENCY DEPARTMENT AT [facility] Provider Note   CSN: [id number] Arrival date & time: 03/10/24  2313     Patient presents with: Anal Fissure   Shawn [name] is a 38 y.o. male.  {Add pertinent medical, surgical, social history, OB history to YEP:67052} The history is provided by the patient.  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  He denies fever but has had chills and sweats.  He denies nausea and vomiting.  He has noted some perianal drainage.  He has been taking ibuprofen  for pain which only gives minimal, slight relief.   Prior to Admission medications   Medication Sig Start Date End Date Taking? Authorizing Provider  acetaminophen  (TYLENOL ) 500 MG tablet Take 2 tablets (1,000 mg total) by mouth every 8 (eight) hours as needed for moderate pain. Patient taking differently: Take 1,000 mg by mouth as needed for moderate pain (pain score 4-6). 02/18/23   Jerral Meth, MD  acetaminophen -codeine  (TYLENOL  #3) 300-30 MG tablet Take 1-2 tablets by mouth every 6 (six) hours as needed for moderate pain (pain score 4-6). 12/29/23   Gretta Bertrum ORN, PA-C  senna (SENOKOT) 8.6 MG TABS tablet Take 2 tablets (17.2 mg total) by mouth daily. Patient taking differently: Take 2 tablets by mouth as needed. 09/27/23   Celestina Czar, MD  ustekinumab  (STELARA ) 90 MG/ML SOSY injection Inject 1 mL (90 mg total) into the skin every 28 (twenty-eight) days. 02/03/24   [name] HERO, MD  Vitamin D , Ergocalciferol , (DRISDOL ) 1.25 MG (50000 UNIT) CAPS capsule Take 1 capsule (50,000 Units total) by mouth every 7 (seven) days. 01/18/24   McGrealInocente HERO, MD    Allergies: Shrimp [shellfish allergy] and Nsaids    Review of Systems  All other systems reviewed and are negative.   Updated Vital Signs BP 119/76    Pulse 76   Temp 99.3 F (37.4 C) (Oral)   Resp 19   SpO2 99%   Physical Exam Vitals and nursing note reviewed.   38 year old male, resting comfortably and in no acute distress. Vital signs are normal. Oxygen saturation is 99%, which is normal. Head is normocephalic and atraumatic. PERRLA, EOMI.  Back is markedly tender in the sacral region. Lungs are clear without rales, wheezes, or rhonchi. Chest is nontender. Heart has regular rate and rhythm without murmur. Abdomen is soft, flat, nontender. Rectal: Mild purulent drainage present.  Seton drain is in place.  There is perianal induration and tenderness on the left side. Neurologic: Awake and alert, moves all extremities equally.  (all labs ordered are listed, but only abnormal results are displayed) Labs Reviewed - No data to display  EKG: None  Radiology: No results found.  {Document cardiac monitor, telemetry assessment procedure when appropriate:32947} Procedures   Medications Ordered in the ED  morphine  (PF) 4 MG/ML injection 4 mg (has no administration in time range)      {Click here for ABCD2, HEART and other calculators REFRESH Note before signing:1}                              Medical Decision Making Amount and/or Complexity of Data Reviewed Labs: ordered. Radiology: ordered.  Risk Prescription drug management.   Increased perianal and sacral pain and patient with known history of  coccygeal osteomyelitis and anal fistula as complications of Crohn's disease.  Exam is concerning for possible recurrence of perianal abscess and possible recurrence of coccygeal and sacral osteomyelitis.  I have ordered screening labs including sedimentation rate and CRP and I have ordered CT of abdomen and pelvis.  He may need MRI to evaluate for osteomyelitis.  {Document critical care time when appropriate  Document review of labs and clinical decision tools ie CHADS2VASC2, etc  Document your independent review of radiology  images and any outside records  Document your discussion with family members, caretakers and with consultants  Document social determinants of health affecting pt's care  Document your decision making why or why not admission, treatments were needed:32947:::1}   Final diagnoses:  None    ED Discharge Orders     None

## 2024-03-11 ENCOUNTER — Emergency Department (HOSPITAL_COMMUNITY)

## 2024-03-11 ENCOUNTER — Inpatient Hospital Stay (HOSPITAL_COMMUNITY)

## 2024-03-11 ENCOUNTER — Encounter (HOSPITAL_COMMUNITY): Payer: Self-pay | Admitting: Internal Medicine

## 2024-03-11 DIAGNOSIS — K50113 Crohn's disease of large intestine with fistula: Secondary | ICD-10-CM | POA: Diagnosis not present

## 2024-03-11 DIAGNOSIS — K603 Anal fistula, unspecified: Secondary | ICD-10-CM | POA: Diagnosis not present

## 2024-03-11 DIAGNOSIS — N289 Disorder of kidney and ureter, unspecified: Secondary | ICD-10-CM | POA: Diagnosis not present

## 2024-03-11 DIAGNOSIS — K651 Peritoneal abscess: Secondary | ICD-10-CM | POA: Diagnosis not present

## 2024-03-11 DIAGNOSIS — K6289 Other specified diseases of anus and rectum: Secondary | ICD-10-CM

## 2024-03-11 DIAGNOSIS — K5909 Other constipation: Secondary | ICD-10-CM | POA: Diagnosis not present

## 2024-03-11 DIAGNOSIS — I420 Dilated cardiomyopathy: Secondary | ICD-10-CM | POA: Diagnosis not present

## 2024-03-11 DIAGNOSIS — F40240 Claustrophobia: Secondary | ICD-10-CM | POA: Diagnosis not present

## 2024-03-11 DIAGNOSIS — F17210 Nicotine dependence, cigarettes, uncomplicated: Secondary | ICD-10-CM | POA: Diagnosis not present

## 2024-03-11 DIAGNOSIS — R609 Edema, unspecified: Secondary | ICD-10-CM | POA: Diagnosis not present

## 2024-03-11 DIAGNOSIS — E538 Deficiency of other specified B group vitamins: Secondary | ICD-10-CM

## 2024-03-11 DIAGNOSIS — L02212 Cutaneous abscess of back [any part, except buttock]: Secondary | ICD-10-CM | POA: Diagnosis not present

## 2024-03-11 DIAGNOSIS — K610 Anal abscess: Secondary | ICD-10-CM | POA: Diagnosis not present

## 2024-03-11 DIAGNOSIS — K50114 Crohn's disease of large intestine with abscess: Secondary | ICD-10-CM | POA: Diagnosis not present

## 2024-03-11 DIAGNOSIS — K50914 Crohn's disease, unspecified, with abscess: Secondary | ICD-10-CM | POA: Diagnosis not present

## 2024-03-11 DIAGNOSIS — E875 Hyperkalemia: Secondary | ICD-10-CM | POA: Diagnosis not present

## 2024-03-11 DIAGNOSIS — I129 Hypertensive chronic kidney disease with stage 1 through stage 4 chronic kidney disease, or unspecified chronic kidney disease: Secondary | ICD-10-CM | POA: Diagnosis not present

## 2024-03-11 DIAGNOSIS — Z5941 Food insecurity: Secondary | ICD-10-CM | POA: Diagnosis not present

## 2024-03-11 DIAGNOSIS — K50913 Crohn's disease, unspecified, with fistula: Secondary | ICD-10-CM | POA: Diagnosis not present

## 2024-03-11 DIAGNOSIS — Z604 Social exclusion and rejection: Secondary | ICD-10-CM | POA: Diagnosis not present

## 2024-03-11 DIAGNOSIS — Z833 Family history of diabetes mellitus: Secondary | ICD-10-CM | POA: Diagnosis not present

## 2024-03-11 DIAGNOSIS — G8929 Other chronic pain: Secondary | ICD-10-CM | POA: Diagnosis not present

## 2024-03-11 DIAGNOSIS — R52 Pain, unspecified: Secondary | ICD-10-CM | POA: Diagnosis not present

## 2024-03-11 DIAGNOSIS — D649 Anemia, unspecified: Secondary | ICD-10-CM | POA: Diagnosis not present

## 2024-03-11 DIAGNOSIS — Z933 Colostomy status: Secondary | ICD-10-CM | POA: Diagnosis not present

## 2024-03-11 DIAGNOSIS — Z79620 Long term (current) use of immunosuppressive biologic: Secondary | ICD-10-CM | POA: Diagnosis not present

## 2024-03-11 DIAGNOSIS — N182 Chronic kidney disease, stage 2 (mild): Secondary | ICD-10-CM | POA: Diagnosis not present

## 2024-03-11 DIAGNOSIS — D849 Immunodeficiency, unspecified: Secondary | ICD-10-CM | POA: Diagnosis not present

## 2024-03-11 DIAGNOSIS — K50119 Crohn's disease of large intestine with unspecified complications: Secondary | ICD-10-CM | POA: Diagnosis not present

## 2024-03-11 DIAGNOSIS — Z841 Family history of disorders of kidney and ureter: Secondary | ICD-10-CM | POA: Diagnosis not present

## 2024-03-11 DIAGNOSIS — D638 Anemia in other chronic diseases classified elsewhere: Secondary | ICD-10-CM | POA: Diagnosis not present

## 2024-03-11 DIAGNOSIS — M4628 Osteomyelitis of vertebra, sacral and sacrococcygeal region: Secondary | ICD-10-CM | POA: Diagnosis not present

## 2024-03-11 DIAGNOSIS — M8660 Other chronic osteomyelitis, unspecified site: Secondary | ICD-10-CM | POA: Diagnosis not present

## 2024-03-11 DIAGNOSIS — N281 Cyst of kidney, acquired: Secondary | ICD-10-CM | POA: Diagnosis not present

## 2024-03-11 DIAGNOSIS — M609 Myositis, unspecified: Secondary | ICD-10-CM | POA: Diagnosis not present

## 2024-03-11 DIAGNOSIS — K611 Rectal abscess: Secondary | ICD-10-CM | POA: Diagnosis not present

## 2024-03-11 DIAGNOSIS — Z91013 Allergy to seafood: Secondary | ICD-10-CM | POA: Diagnosis not present

## 2024-03-11 DIAGNOSIS — L0231 Cutaneous abscess of buttock: Secondary | ICD-10-CM | POA: Diagnosis not present

## 2024-03-11 DIAGNOSIS — K5901 Slow transit constipation: Secondary | ICD-10-CM | POA: Diagnosis not present

## 2024-03-11 DIAGNOSIS — K6819 Other retroperitoneal abscess: Secondary | ICD-10-CM | POA: Diagnosis not present

## 2024-03-11 LAB — CBC WITH DIFFERENTIAL/PLATELET
Abs Immature Granulocytes: 0.08 K/uL — ABNORMAL HIGH (ref 0.00–0.07)
Basophils Absolute: 0.1 K/uL (ref 0.0–0.1)
Basophils Relative: 0 %
Eosinophils Absolute: 0.4 K/uL (ref 0.0–0.5)
Eosinophils Relative: 3 %
HCT: 28.4 % — ABNORMAL LOW (ref 39.0–52.0)
Hemoglobin: 8.9 g/dL — ABNORMAL LOW (ref 13.0–17.0)
Immature Granulocytes: 1 %
Lymphocytes Relative: 21 %
Lymphs Abs: 3.3 K/uL (ref 0.7–4.0)
MCH: 26.9 pg (ref 26.0–34.0)
MCHC: 31.3 g/dL (ref 30.0–36.0)
MCV: 85.8 fL (ref 80.0–100.0)
Monocytes Absolute: 0.9 K/uL (ref 0.1–1.0)
Monocytes Relative: 5 %
Neutro Abs: 11.2 K/uL — ABNORMAL HIGH (ref 1.7–7.7)
Neutrophils Relative %: 70 %
Platelets: 378 K/uL (ref 150–400)
RBC: 3.31 MIL/uL — ABNORMAL LOW (ref 4.22–5.81)
RDW: 22.7 % — ABNORMAL HIGH (ref 11.5–15.5)
WBC: 15.9 K/uL — ABNORMAL HIGH (ref 4.0–10.5)
nRBC: 0 % (ref 0.0–0.2)

## 2024-03-11 LAB — COMPREHENSIVE METABOLIC PANEL WITH GFR
ALT: 16 U/L (ref 0–44)
AST: 13 U/L — ABNORMAL LOW (ref 15–41)
Albumin: 3 g/dL — ABNORMAL LOW (ref 3.5–5.0)
Alkaline Phosphatase: 80 U/L (ref 38–126)
Anion gap: 6 (ref 5–15)
BUN: 21 mg/dL — ABNORMAL HIGH (ref 6–20)
CO2: 20 mmol/L — ABNORMAL LOW (ref 22–32)
Calcium: 8.2 mg/dL — ABNORMAL LOW (ref 8.9–10.3)
Chloride: 109 mmol/L (ref 98–111)
Creatinine, Ser: 1.41 mg/dL — ABNORMAL HIGH (ref 0.61–1.24)
GFR, Estimated: >60 mL/min (ref >60)
Glucose, Bld: 89 mg/dL (ref 70–99)
Potassium: 4.3 mmol/L (ref 3.5–5.1)
Sodium: 135 mmol/L (ref 135–145)
Total Bilirubin: 0.5 mg/dL (ref 0.0–1.2)
Total Protein: 7.7 g/dL (ref 6.5–8.1)

## 2024-03-11 LAB — C-REACTIVE PROTEIN: CRP: 12.5 mg/dL — ABNORMAL HIGH (ref <1.0)

## 2024-03-11 LAB — SEDIMENTATION RATE: Sed Rate: 90 mm/h — ABNORMAL HIGH (ref 0–16)

## 2024-03-11 MED ORDER — HYDROMORPHONE HCL 1 MG/ML IJ SOLN
1.0000 mg | Freq: Once | INTRAMUSCULAR | Status: AC
  Administered 2024-03-11: 1 mg via INTRAVENOUS
  Filled 2024-03-11: qty 1

## 2024-03-11 MED ORDER — IOHEXOL 300 MG/ML  SOLN
100.0000 mL | Freq: Once | INTRAMUSCULAR | Status: AC | PRN
  Administered 2024-03-11: 100 mL via INTRAVENOUS

## 2024-03-11 MED ORDER — ENOXAPARIN SODIUM 40 MG/0.4ML IJ SOSY
40.0000 mg | PREFILLED_SYRINGE | INTRAMUSCULAR | Status: DC
  Administered 2024-03-11 – 2024-03-16 (×5): 40 mg via SUBCUTANEOUS
  Filled 2024-03-11 (×6): qty 0.4

## 2024-03-11 MED ORDER — LORAZEPAM 1 MG PO TABS: 1.0000 mg | ORAL_TABLET | Freq: Once | ORAL | Status: DC

## 2024-03-11 MED ORDER — ONDANSETRON HCL 4 MG/2ML IJ SOLN
4.0000 mg | Freq: Once | INTRAMUSCULAR | Status: AC
  Administered 2024-03-11: 4 mg via INTRAVENOUS
  Filled 2024-03-11: qty 2

## 2024-03-11 MED ORDER — GADOBUTROL 1 MMOL/ML IV SOLN
8.0000 mL | Freq: Once | INTRAVENOUS | Status: AC | PRN
  Administered 2024-03-11: 8 mL via INTRAVENOUS

## 2024-03-11 MED ORDER — ALBUTEROL SULFATE (2.5 MG/3ML) 0.083% IN NEBU: 2.5000 mg | INHALATION_SOLUTION | RESPIRATORY_TRACT | Status: DC | PRN

## 2024-03-11 MED ORDER — LACTATED RINGERS IV SOLN: INTRAVENOUS | Status: AC

## 2024-03-11 MED ORDER — ONDANSETRON HCL 4 MG/2ML IJ SOLN: 4.0000 mg | Freq: Four times a day (QID) | INTRAMUSCULAR | Status: DC | PRN

## 2024-03-11 MED ORDER — POLYETHYLENE GLYCOL 3350 17 G PO PACK
17.0000 g | PACK | Freq: Every day | ORAL | Status: DC
  Administered 2024-03-11 – 2024-03-14 (×4): 17 g via ORAL
  Filled 2024-03-11 (×4): qty 1

## 2024-03-11 MED ORDER — ACETAMINOPHEN 325 MG PO TABS
650.0000 mg | ORAL_TABLET | Freq: Four times a day (QID) | ORAL | Status: DC | PRN
Start: 2024-03-11 — End: 2024-03-14
  Administered 2024-03-13: 650 mg via ORAL
  Filled 2024-03-11: qty 2

## 2024-03-11 MED ORDER — OXYCODONE HCL 5 MG PO TABS
5.0000 mg | ORAL_TABLET | ORAL | Status: DC | PRN
  Administered 2024-03-11 – 2024-03-14 (×12): 5 mg via ORAL
  Filled 2024-03-11 (×13): qty 1

## 2024-03-11 MED ORDER — CYANOCOBALAMIN 1000 MCG/ML IJ SOLN
1000.0000 ug | Freq: Once | INTRAMUSCULAR | Status: AC
  Administered 2024-03-11: 1000 ug via INTRAMUSCULAR
  Filled 2024-03-11: qty 1

## 2024-03-11 MED ORDER — LORAZEPAM 2 MG/ML IJ SOLN
0.5000 mg | Freq: Once | INTRAMUSCULAR | Status: AC
  Administered 2024-03-11: 0.5 mg via INTRAVENOUS
  Filled 2024-03-11: qty 1

## 2024-03-11 MED ORDER — ACETAMINOPHEN 650 MG RE SUPP: 650.0000 mg | Freq: Four times a day (QID) | RECTAL | Status: DC | PRN

## 2024-03-11 MED ORDER — ONDANSETRON HCL 4 MG PO TABS: 4.0000 mg | ORAL_TABLET | Freq: Four times a day (QID) | ORAL | Status: DC | PRN

## 2024-03-11 NOTE — H&P (Signed)
 History and Physical  Shawn [name] FMW:[id number] DOB: 04/05/1986 DOA: 03/10/2024  PCP: [name], FNP   Chief Complaint: Tailbone pain  HPI: Shawn [name] is a 38 y.o. male with medical history significant for Crohn's disease and associated perirectal abscess x 2, sacral osteomyelitis, CKD, dilated cardiomyopathy being admitted to the hospital with rectal pain of unclear etiology.  Patient states that for the last week or so, he has been having increasing sacral and coccygeal as well as perianal pain, has not had any fever but has had some subjective chills and sweats.  No nausea or vomiting, no abdominal pain per se.  Workup in the emergency department as detailed below is relatively unremarkable for any new findings, patient was admitted to the hospitalist service for pain control.  Gastroenterology has been consulted.  Review of Systems: Please see HPI for pertinent positives and negatives. A complete 10 system review of systems are otherwise negative.  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   PO B12 initiated 12/31/20   Anxiety    Arthritis    Chronic kidney disease    Crohn's colitis (HCC)    Depression    Dilated cardiomyopathy (HCC) 05/18/2013   By catheterization 2014    Fistula    GERD (gastroesophageal reflux disease)    Hypertension    Neuromuscular disorder (HCC)    Osteomyelitis (HCC)    tailbone   Pneumonia    Past Surgical History:  Procedure Laterality Date   BIOPSY  01/01/2021   Procedure: BIOPSY;  Surgeon: Aneita Gwendlyn DASEN, MD;  Location: [facility] ENDOSCOPY;  Service: Endoscopy;;   COCCYGECTOMY  07/07/2021   COLONOSCOPY WITH PROPOFOL  N/A 01/01/2021   Procedure: COLONOSCOPY WITH PROPOFOL ;  Surgeon: Aneita Gwendlyn DASEN, MD;  Location: [facility] ENDOSCOPY;  Service: Endoscopy;  Laterality: N/A;   INCISION AND DRAINAGE ABSCESS N/A 07/07/2021    Procedure: MODIFIED HANLEY PROCEDURE, DRAINAGE WITH SETON PLACEMENT;  Surgeon: [name], MD;  Location: [facility] ORS;  Service: General;  Laterality: N/A;   INCISION AND DRAINAGE ABSCESS  05/04/2017   right groin   INCISION AND DRAINAGE PERIRECTAL ABSCESS  04/21/2021   INCISION AND DRAINAGE PERIRECTAL ABSCESS  05/18/2013   LEFT AND RIGHT HEART CATHETERIZATION WITH CORONARY ANGIOGRAM N/A 11/21/2012   Procedure: LEFT AND RIGHT HEART CATHETERIZATION WITH CORONARY ANGIOGRAM;  Surgeon: Salena GORMAN Negri, MD;  Location: [facility] CATH LAB;  Service: Cardiovascular;  Laterality: N/A;   MANDIBLE FRACTURE SURGERY  08/23/2004   PLACEMENT OF SETON N/A 07/07/2021   Procedure: PLACEMENT OF SETON;  Surgeon: [name], MD;  Location: [facility] ORS;  Service: General;  Laterality: N/A;   PLACEMENT OF SETON N/A 01/15/2022   Procedure: PLACEMENT OF SETON;  Surgeon: [name], MD;  Location: [facility] ORS;  Service: General;  Laterality: N/A;   RECTAL EXAM UNDER ANESTHESIA N/A 01/15/2022   Procedure: RECTAL EXAM UNDER ANESTHESIA;  Surgeon: [name], MD;  Location: [facility] ORS;  Service: General;  Laterality: N/A;  TRANSRECTAL DRAINAGE REPLACEMENT OF SETON EXCISION OF PERINEAL SINUS TRACT ANORECTAL EXAM UNDER ANESTHESIA   Social History:  reports that he has quit smoking. His smoking use included cigarettes. He has never used smokeless tobacco. He reports that he does not currently use alcohol. He reports that he does not use drugs.  Allergies  Allergen Reactions   Shrimp [Shellfish Allergy] Shortness Of Breath   Nsaids Other (See Comments)  CROHN'S DISEASE = NO NSAIDS    Family History  Problem Relation Age of Onset   Diabetes Mother    Kidney failure Mother    Diabetes Sister    Colon cancer Neg Hx    Stomach cancer Neg Hx    Esophageal cancer Neg Hx    Pancreatic disease Neg Hx    Colon polyps Neg Hx    Rectal cancer Neg Hx      Prior to Admission medications   Medication Sig Start Date End Date Taking?  Authorizing Provider  ibuprofen  (ADVIL ) 200 MG tablet Take 200 mg by mouth every 6 (six) hours as needed for moderate pain (pain score 4-6).   Yes [provider]  senna (SENOKOT) 8.6 MG TABS tablet Take 2 tablets (17.2 mg total) by mouth daily. Patient taking differently: Take 2 tablets by mouth as needed. 09/27/23  Yes Celestina Czar, MD  ustekinumab  (STELARA ) 90 MG/ML SOSY injection Inject 1 mL (90 mg total) into the skin every 28 (twenty-eight) days. 02/03/24  Yes McGreal, Inocente HERO, MD  acetaminophen  (TYLENOL ) 500 MG tablet Take 2 tablets (1,000 mg total) by mouth every 8 (eight) hours as needed for moderate pain. Patient not taking: Reported on 03/10/2024 02/18/23   Jerral Meth, MD  acetaminophen -codeine  (TYLENOL  #3) 300-30 MG tablet Take 1-2 tablets by mouth every 6 (six) hours as needed for moderate pain (pain score 4-6). Patient not taking: Reported on 03/10/2024 12/29/23   Gretta Bertrum ORN, PA-C  Vitamin D , Ergocalciferol , (DRISDOL ) 1.25 MG (50000 UNIT) CAPS capsule Take 1 capsule (50,000 Units total) by mouth every 7 (seven) days. 01/18/24   [name] HERO, MD    Physical Exam: BP 133/74 (BP Location: Right Arm)   Pulse 74   Temp 98.3 F (36.8 C) (Oral)   Resp 16   SpO2 100%  General:  Alert, oriented, calm, in no acute distress, looks comfortable and nontoxic Cardiovascular: RRR, no murmurs or rubs, no peripheral edema  Respiratory: clear to auscultation bilaterally, no wheezes, no crackles  Abdomen: soft, nontender, nondistended, normal bowel tones heard  Skin: dry, no rashes  Musculoskeletal: no joint effusions, normal range of motion  Psychiatric: appropriate affect, normal speech  Neurologic: extraocular muscles intact, clear speech, moving all extremities with intact sensorium         Labs on Admission:  Basic Metabolic Panel: Recent Labs  Lab 03/11/24 0001  NA 135  K 4.3  CL 109  CO2 20*  GLUCOSE 89  BUN 21*  CREATININE 1.41*  CALCIUM  8.2*   Liver  Function Tests: Recent Labs  Lab 03/11/24 0001  AST 13*  ALT 16  ALKPHOS 80  BILITOT 0.5  PROT 7.7  ALBUMIN  3.0*   No results for input(s): LIPASE, AMYLASE in the last 168 hours. No results for input(s): AMMONIA in the last 168 hours. CBC: Recent Labs  Lab 03/11/24 0001  WBC 15.9*  NEUTROABS 11.2*  HGB 8.9*  HCT 28.4*  MCV 85.8  PLT 378   Cardiac Enzymes: No results for input(s): CKTOTAL, CKMB, CKMBINDEX, TROPONINI in the last 168 hours. BNP (last 3 results) No results for input(s): BNP in the last 8760 hours.  ProBNP (last 3 results) No results for input(s): PROBNP in the last 8760 hours.  CBG: No results for input(s): GLUCAP in the last 168 hours.  Radiological Exams on Admission: CT ABDOMEN PELVIS W CONTRAST Result Date: 03/11/2024 EXAM: CT ABDOMEN AND PELVIS WITH CONTRAST 03/11/2024 01:33:53 AM TECHNIQUE: CT of the abdomen and  pelvis was performed with the administration of intravenous contrast. Multiplanar reformatted images are provided for review. Automated exposure control, iterative reconstruction, and/or weight based adjustment of the mA/kV was utilized to reduce the radiation dose to as low as reasonably achievable. 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 recurrence of osteomyelitis of sacrum/coccyx. FINDINGS: LOWER CHEST: No acute abnormality. LIVER: The liver is unremarkable. GALLBLADDER AND BILE DUCTS: Gallbladder is unremarkable. No biliary ductal dilatation. SPLEEN: No acute abnormality. PANCREAS: No acute abnormality. ADRENAL GLANDS: No acute abnormality. KIDNEYS, URETERS AND BLADDER: Numerous bilateral renal cysts measuring up to 4.2 cm in the anterior left lower kidney, benign (Bosniak 1). No follow up is recommended. No stones in the kidneys or ureters. No hydronephrosis. No perinephric or periureteral stranding. Urinary bladder is unremarkable. GI AND BOWEL: Stomach demonstrates no acute abnormality. There  is no bowel obstruction. No bowel wall thickening. Normal appendix. 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 PERITONEUM AND RETROPERITONEUM: Stable fluid and soft tissue gas in the presacral region (image 77) and along the medial left gluteal region/ischiorectal fossa (image 91). VASCULATURE: Aorta is normal in caliber. LYMPH NODES: No lymphadenopathy. REPRODUCTIVE ORGANS: No acute abnormality. BONES AND SOFT TISSUES: No cortical destruction to suggest sacral osteomyelitis. IMPRESSION: 1. 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 2. Stable fluid and soft tissue gas in the presacral region and along the medial left gluteal region/ischiorectal fossa. No new abscess. 3. No cortical destruction to suggest sacral osteomyelitis. Electronically signed by: Pinkie Pebbles MD 03/11/2024 01:46 AM EDT RP Workstation: [id number]   Assessment/Plan Shawn [name] is a 38 y.o. male with medical history significant for Crohn's disease and associated perirectal abscess x 2, sacral osteomyelitis, CKD, dilated cardiomyopathy being admitted to the hospital with rectal pain of unclear etiology.    Rectal pain-patient is stable anal seton in place, as well as stable fluid and soft tissue gas in the presacral region with no evidence of new abscess or inflammatory findings. -Inpatient admission -Pain and nausea control as needed -Clear liquid diet -Await GI evaluation  Crohn's disease-on Stelara  monthly  CKD stage III-appears to be at baseline  DVT prophylaxis: Lovenox      Code Status: Full Code  Consults called: [facility]  Admission status: The appropriate patient status for this patient is INPATIENT. Inpatient status is judged to be reasonable and necessary in order to provide the required intensity of service to ensure the patient's safety. The patient's presenting symptoms, physical exam findings, and initial radiographic and laboratory data in the context of their chronic comorbidities is felt to place them at high risk for further  clinical deterioration. Furthermore, it is not anticipated that the patient will be medically stable for discharge from the hospital within 2 midnights of admission.    I certify that at the point of admission it is my clinical judgment that the patient will require inpatient hospital care spanning beyond 2 midnights from the point of admission due to high intensity of service, high risk for further deterioration and high frequency of surveillance required  Time spent: 48 minutes  Zephyra Bernardi CHRISTELLA Gail MD [facility] Pager [phone]  If 7PM-7AM, please contact night-coverage [email or web address] Password [email or web address]  03/11/2024, 9:13 AM

## 2024-03-11 NOTE — Plan of Care (Signed)

## 2024-03-11 NOTE — Consult Note (Addendum)
 Consultation  Referring Provider:     [facility] Primary Care Physician:  [name], FNP Primary Gastroenterologist:        Dr. Suzann Reason for Consultation:     rectal pain and discharge in Crohn's disease     Impression / Plan:   Crohn's disease, perianal fistulizing disease status post recurrent perianal abscesses, seton in place, also status post coccygectomy for coccygeal osteomyelitis with increasing perianal and rectal pain and discharge from fistula tract.  CRP and ESR are increased compared to most recent values.  WBC 15.9.  B12 deficiency  Anemia, suspect chronic disease plus B12 deficiency, hemoglobin decreased from 10.93 weeks ago to 8.9.  Renal insufficiency suspect chronic kidney disease  ------------------------------------------------------------------------------------------------------------------------------ Patient needs consultation with infectious disease, surgery for additional input. I have called both services (Dr. Overton and Dr. Tanda)  I think antibiotics will be indicated but  would like ID input  Could need repeat MR pelvis.  Patient has some claustrophobia and would most likely need lorazepam  or other benzodiazepine it seems.  Holding off on MRI at this time awaiting other physician input.  Radiology was not certain it would add anything though I suspect it would be more sensitive for osteomyelitis which is of concern.  B12 1000ug IM x 1 and will need 3 more weekly doses then chronic therapy   MiraLAx  qd      HPI:   Shawn [name] is a 38 y.o. 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  His current Crohn's therapy is ustekinumab .  He reports that his  pain is chronic but worsening, so much so that he had to call an ambulance to come to the hospital.  There has been an increase in this yellow mucoid discharge as well but no bleeding.  He has been experiencing constipation and says he has gone for up to a week without defecation and when he does.  Last bowel movement Wednesday.  No rectal bleeding.  Describes subjective fevers and chills but has not taken his temperature.  He has been on ibuprofen  at home for pain.  He was trying to get established with a pain clinic, he says he was given an appointment with a provider who was not a pain physician and then was rescheduled for the next day but then received a call from his primary care provider saying he was discharged from the pain clinic.  He does have abdominal pain in the midline infraumbilical area when he has to pass flatus or has urge to stool.  He has been nauseous but has not vomited.  CT scanning in the ER is demonstrated what are described as stable changes of presacral region and medial left gluteal and ischial rectal region.  His last visit in gastroenterology was on May 28 with Dr. Suzann.  Ustekinumab  trough level was 4.4, no antibodies.  This level of ustekinumab  is lower than desired for his situation and he was changed to every 4-week ustekinumab  and has taken his first every 4-week dose 3 days ago (Wednesday, July 16).  Inflammatory markers were improved at that time, vitamin D  level was low so 50,000 units weekly was prescribed also.  He was evaluated in the hematology clinic on June 24 and had a battery of tests run.  His  methylmalonic acid level was elevated at 2627, his vitamin B12 level was low at 105.  EPO level was 21.7 elevated testosterone  was normal, ferritin was 159, reticulocytes increased, heavy metal screen negative, hemoglobin was 10.9 with MCV 83.2.  Vitamin B12 injections were ordered to be done every week for 4 weeks but he was unable to make his first appointment and has  not had that injection.   ROS - left LLE pain c/w history of sciatica    nflammatory Bowel Disease History  05/2020-coccygeal pain, CT imaging demonstrating perirectal stranding 10/2020-right groin abscess ?  Diagnosis of hidradenitis suppurativa 11/2020-rectal pain, IDA and CT showing rectal wall stranding 12/2020-colonoscopy demonstrating rectal inflammation in the distal, mid and proximal rectum; remainder of colon normal-pathology consistent with chronic colitis with focal activity in the rectum 03/2021 - EUA with incision and drainage of left perianal abscess; diagnosed with coccygeal osteomyelitis around this time 06/2021 - modified Hanley procedure and coccygectomy 07/2021 - completed antibiotic treatment with cefdinir  metronidazole ; induction infliximab  5 mg/kg with maintenance every 8 weeks 12/2021 - recurrent perianal fistula and abscess 03/2022 - therapeutic drug monitoring demonstrated nil level less than 0.4, high antibodies 1358 08/2022 - induction Stelara  followed by maintenance 90 mg subcutaneously every 8 weeks 03/2023 - reports improved symptoms; colonoscopy showed diminished rectal inflammation now moderate in severity --> Pt ceased Stelara  of own volition 08/2022 - hospitalized [facility] with recurrent perianal abscesses and concern for sacral osteomyelitis --> treated with daptomycin , cefepime  and metronidazole  followed by 1 month of cefadroxil  and metronidazole  11/2023 -Stelara  reinduction June 2025 Stelara  frequency increased to every 4 weeks   IBD Medication History Infliximab  -reported ineffective (? Dosing) Stelara  -induction 08/2022 with preliminary response but patient stopped medication prematurely; reinduction 11/2023     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   PO B12 initiated 12/31/20   Anxiety    Arthritis    Chronic kidney disease     Crohn's colitis (HCC)    Depression    Dilated cardiomyopathy (HCC) 05/18/2013   By catheterization 2014    Fistula    GERD (gastroesophageal reflux disease)    Hypertension    Neuromuscular disorder (HCC)    Osteomyelitis (HCC)    tailbone   Pneumonia     Past Surgical History:  Procedure Laterality Date   BIOPSY  01/01/2021   Procedure: BIOPSY;  Surgeon: Aneita Gwendlyn DASEN, MD;  Location: [facility] ENDOSCOPY;  Service: Endoscopy;;   COCCYGECTOMY  07/07/2021   COLONOSCOPY WITH PROPOFOL  N/A 01/01/2021   Procedure: COLONOSCOPY WITH PROPOFOL ;  Surgeon: Aneita Gwendlyn DASEN, MD;  Location: [facility] ENDOSCOPY;  Service: Endoscopy;  Laterality: N/A;   INCISION AND DRAINAGE ABSCESS N/A 07/07/2021   Procedure: MODIFIED HANLEY PROCEDURE, DRAINAGE WITH SETON PLACEMENT;  Surgeon: [name], MD;  Location: [facility] ORS;  Service: General;  Laterality: N/A;   INCISION AND DRAINAGE ABSCESS  05/04/2017   right groin   INCISION AND DRAINAGE PERIRECTAL ABSCESS  04/21/2021   INCISION AND DRAINAGE PERIRECTAL ABSCESS  05/18/2013   LEFT AND RIGHT HEART CATHETERIZATION WITH CORONARY ANGIOGRAM N/A 11/21/2012   Procedure: LEFT AND RIGHT HEART CATHETERIZATION WITH CORONARY ANGIOGRAM;  Surgeon: Salena GORMAN Negri, MD;  Location: [facility] CATH LAB;  Service: Cardiovascular;  Laterality: N/A;   MANDIBLE FRACTURE SURGERY  08/23/2004   PLACEMENT OF SETON N/A 07/07/2021   Procedure: PLACEMENT OF SETON;  Surgeon: [name], MD;  Location: [facility] ORS;  Service: General;  Laterality: N/A;   PLACEMENT OF SETON N/A 01/15/2022   Procedure: PLACEMENT OF SETON;  Surgeon: [name], MD;  Location: [facility] ORS;  Service: General;  Laterality: N/A;   RECTAL EXAM UNDER ANESTHESIA N/A 01/15/2022   Procedure: RECTAL EXAM UNDER ANESTHESIA;  Surgeon: [name], MD;  Location: [facility] ORS;  Service: General;  Laterality: N/A;  TRANSRECTAL DRAINAGE REPLACEMENT OF SETON EXCISION OF PERINEAL SINUS TRACT ANORECTAL EXAM UNDER ANESTHESIA    Family History   Problem Relation Age of Onset   Diabetes Mother    Kidney failure Mother    Diabetes Sister    Colon cancer Neg Hx    Stomach cancer Neg Hx    Esophageal cancer Neg Hx    Pancreatic disease Neg Hx    Colon polyps Neg Hx    Rectal cancer Neg Hx      Social History   Tobacco Use   Smoking status: Former    Current packs/day: 0.25    Types: Cigarettes   Smokeless tobacco: Never  Vaping Use   Vaping status: Never Used  Substance Use Topics   Alcohol use: Not Currently    Comment: occ   Drug use: No    Prior to Admission medications   Medication Sig Start Date End Date Taking? Authorizing Provider  ibuprofen  (ADVIL ) 200 MG tablet Take 200 mg by mouth every 6 (six) hours as needed for moderate pain (pain score 4-6).   Yes [provider]  senna (SENOKOT) 8.6 MG TABS tablet Take 2 tablets (17.2 mg total) by mouth daily. Patient taking differently: Take 2 tablets by mouth as needed. 09/27/23  Yes Celestina Czar, MD  ustekinumab  (STELARA ) 90 MG/ML SOSY injection Inject 1 mL (90 mg total) into the skin every 28 (twenty-eight) days. 02/03/24  Yes McGreal, Inocente HERO, MD  acetaminophen  (TYLENOL ) 500 MG tablet Take 2 tablets (1,000 mg total) by mouth every 8 (eight) hours as needed for moderate pain. Patient not taking: Reported on 03/10/2024 02/18/23   Jerral Meth, MD  acetaminophen -codeine  (TYLENOL  #3) 300-30 MG tablet Take 1-2 tablets by mouth every 6 (six) hours as needed for moderate pain (pain score 4-6). Patient not taking: Reported on 03/10/2024 12/29/23   Gretta Bertrum ORN, PA-C  Vitamin D , Ergocalciferol , (DRISDOL ) 1.25 MG (50000 UNIT) CAPS capsule Take 1 capsule (50,000 Units total) by mouth every 7 (seven) days. 01/18/24   [name] HERO, MD    Current Facility-Administered Medications  Medication Dose Route Frequency Provider Last Rate Last Admin   acetaminophen  (TYLENOL ) tablet 650 mg  650 mg Oral Q6H PRN Zella, Mir M, MD       Or   acetaminophen  (TYLENOL )  suppository 650 mg  650 mg Rectal Q6H PRN Zella, Mir M, MD       albuterol  (PROVENTIL ) (2.5 MG/3ML) 0.083% nebulizer solution 2.5 mg  2.5 mg Nebulization Q2H PRN Zella, Mir M, MD       enoxaparin  (LOVENOX ) injection 40 mg  40 mg Subcutaneous Q24H Zella, Mir M, MD   40 mg at 03/11/24 1046   lactated ringers  infusion   Intravenous Continuous Zella, Mir M, MD 50 mL/hr at 03/11/24 1045 New Bag at 03/11/24 1045   morphine  (PF) 4 MG/ML injection 4 mg  4 mg Intravenous Q30 min PRN Raford Lenis, MD   4 mg at 03/11/24 0106   ondansetron  (ZOFRAN ) tablet 4 mg  4 mg Oral Q6H PRN Zella Katha HERO, MD  Or   ondansetron  (ZOFRAN ) injection 4 mg  4 mg Intravenous Q6H PRN Zella, Mir M, MD       oxyCODONE  (Oxy IR/ROXICODONE ) immediate release tablet 5 mg  5 mg Oral Q4H PRN Zella, Mir M, MD   5 mg at 03/11/24 1108   Facility-Administered Medications Ordered in Other Encounters  Medication Dose Route Frequency Provider Last Rate Last Admin   Chlorhexidine  Gluconate Cloth 2 % PADS 6 each  6 each Topical Once [name], MD       And   Chlorhexidine  Gluconate Cloth 2 % PADS 6 each  6 each Topical Once Gross, Standing, MD       feeding supplement (ENSURE PRE-SURGERY) liquid 296 mL  296 mL Oral Once [name], MD        Allergies as of 03/10/2024 - Review Complete 03/10/2024  Allergen Reaction Noted   Shrimp [shellfish allergy] Shortness Of Breath 05/16/2013   Nsaids Other (See Comments) 07/06/2021     Review of Systems:    This is positive for those things mentioned in the HPI. All other review of systems are negative.       Physical Exam:  Vital signs in last 24 hours: Temp:  [98.2 F (36.8 C)-99.3 F (37.4 C)] 98.2 F (36.8 C) (07/20 1244) Pulse Rate:  [62-91] 62 (07/20 1244) Resp:  [16-19] 16 (07/20 1244) BP: (103-133)/(59-86) 103/59 (07/20 1244) SpO2:  [99 %-100 %] 99 % (07/20 1244) Weight:  [80.9 kg] 80.9 kg (07/20 1200)    General:  Well-developed,  well-nourished and in no acute distress Eyes:  anicteric. Lungs: Clear to auscultation bilaterally. Heart:   S1S2, no rubs, murmurs, gallops. Abdomen:  soft, non-tender, no hepatosplenomegaly, hernia, or mass and BS+.   Rectal: Inspection of the rectal area shows a seton in place extending from the posterior midline area where the coccyx would be and routing through the rectum.  There is a yellow mucoid discharge.  There is some possible fluctuance and swelling to the left of this fistula tract.  He is more tender there than on the right.  There is no discrete mass or abscess.  Digital rectal exam is not tolerated due to tenderness.  Lymph:  no inguinal adenopathy. Spine/back: Nontender thoracic lumbar spine area distal sacral area very tender no masses. Skin   no rash. Neuro:  A&O x 3.  Foot strength, hallux strength normal both sides Psych:  Flat affect   Data Reviewed:   LAB RESULTS: Recent Labs    03/11/24 0001  WBC 15.9*  HGB 8.9*  HCT 28.4*  PLT 378   BMET Recent Labs    03/11/24 0001  NA 135  K 4.3  CL 109  CO2 20*  GLUCOSE 89  BUN 21*  CREATININE 1.41*  CALCIUM  8.2*   LFT Recent Labs    03/11/24 0001  PROT 7.7  ALBUMIN  3.0*  AST 13*  ALT 16  ALKPHOS 80  BILITOT 0.5   STUDIES: CT ABDOMEN PELVIS W CONTRAST Result Date: 03/11/2024 EXAM: CT ABDOMEN AND PELVIS WITH CONTRAST 03/11/2024 01:33:53 AM TECHNIQUE: CT of the abdomen and pelvis was performed with the administration of intravenous contrast. Multiplanar reformatted images are provided for review. Automated exposure control, iterative reconstruction, and/or weight based adjustment of the mA/kV was utilized to reduce the radiation dose to as low as reasonably achievable. 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 recurrence of osteomyelitis of sacrum/coccyx. FINDINGS: LOWER CHEST: No acute abnormality. LIVER: The liver  is unremarkable. GALLBLADDER AND BILE DUCTS: Gallbladder is  unremarkable. No biliary ductal dilatation. SPLEEN: No acute abnormality. PANCREAS: No acute abnormality. ADRENAL GLANDS: No acute abnormality. KIDNEYS, URETERS AND BLADDER: Numerous bilateral renal cysts measuring up to 4.2 cm in the anterior left lower kidney, benign (Bosniak 1). No follow up is recommended. No stones in the kidneys or ureters. No hydronephrosis. No perinephric or periureteral stranding. Urinary bladder is unremarkable. GI AND BOWEL: Stomach demonstrates no acute abnormality. There is no bowel obstruction. No bowel wall thickening. Normal appendix. 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 PERITONEUM AND RETROPERITONEUM: Stable fluid and soft tissue gas in the presacral region (image 77) and along the medial left gluteal region/ischiorectal fossa (image 91). VASCULATURE: Aorta is normal in caliber. LYMPH NODES: No lymphadenopathy. REPRODUCTIVE ORGANS: No acute abnormality. BONES AND SOFT TISSUES: No cortical destruction to suggest sacral osteomyelitis. IMPRESSION: 1. 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 2. Stable fluid and soft tissue gas in the presacral region and along the medial left gluteal region/ischiorectal fossa. No new abscess. 3. No cortical destruction to suggest sacral osteomyelitis. Electronically signed by: Pinkie Pebbles MD 03/11/2024 01:46 AM EDT RP Workstation: [id number]        Thanks   LOS: 0 days   @Jaxon Flatt  CHARLENA Commander, MD, [facility] @  03/11/2024, 1:13 PM

## 2024-03-11 NOTE — Consult Note (Signed)
 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 And further more by different scale value (some HS-crp, some regular crp)  Previous culture: 06/2021 ecoli (r amp-sulb; S cipro /flagyl ); not esbl), e faecalis At some point 08/2023 outside ir aspirate culture negative? No hx esbl or pseudomonas   This admission --> His imaging ct doesn't suggest new abscess/new cortical destruction. He is not septic. I am unclear how a Crohn's flare in his case would present (which could contribute to sx mentioned without other evidence of abscess/osteomyelitis    Given immunosuppression, and multiple prior abx he has high likelihood of MDR organisms and I have higher threshold to start empiric abx. Would rather have good evidence of om and would plan for biopsy to guide therapy  I would also like to see at some point the seton drain be removed as it could be a risk for difficult to treat nonfermenter gram negative rod (pseudomonas, acinetobacter for example). Obviously he'll need his fistula disease resolved prior to removal. He  hasn't seen general surgery since 08/2023   I would check blood cx and pelvis mri --> if both negative would ask GI to comment on crohn's process and at the most I would repeat evaluation to see if we need to pull trigger on dedicated OM diagnosis/treatment      Plan: Would defer antibiotics for now Query GI if feels there could be crohn's flare otherwise despite recent good stelara  level 01/18/24 and recent dosing 03/07/24) Also does he need ongoing surgery follow up and eventually seton removal (when after last abscess episode can it be removed) Blood culture Pelvis mri -- a dose ativan  to be given pre proecedure Crp every 72 hours; next one on Wednesday Maintain standard isolation precaution Discussed with primary team and GI dr Enid     ------------------------------------------------ Principal Problem:   Rectal pain Active Problems:   B12 deficiency    HPI: Shawn [name] is a 38 y.o. 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 and intramuscular abscess but negative cx by IR finished another long course IV --> PO abx by 11/2023, ckd, dilated cardiomyopathy, admitted 03/10/24 for increasing perirectal pain, with GI concerning about recurrent sacral OM for which id called   Reviewed chart and discussed with patient Crohn's disease Perianal fistulizing process Recurren tperianl abscesses Seton in place Currently on ustekinumab ; received q4weeks; last dose 03/07/24 Therapy: 07/2021  started infliximab  12/2021 recurrent perianal fistula/abscess 03/2022 infliximab  TDM showed low level and high antibodies 08/2022 induction ustekinumab  11/2023 repeat induction ustekinumab  01/18/24 therapeutic ustekinumab , without antibodies 01/2024  increased frequency to every 4 weeks ustekinumab    He never had bloody diarrhea, previous flare/disease indicated by abscess/fistula/rectal pain He has formed stool every 2-3 days   Chronic daily pain, felt it more at night and some radiating down to the left foot.    ID history: Hx recurrent sacrococcyx OM (last episode 08/2023 and ir cx negative; finished bs iv Abx 10/2023 (dapto/cefepime /flagyl ) and transitioned to 1 more month oral abx (cefadroxil /metronidazole ) Hx coccygectomy 06/2021; modified Hanley procedure    Current episode: Patient increasing (acute on chronic) rectal pain and discharge from seton drain for the past week No fever, nightsweat, malaise Appetite stable No other symptoms  Chronic pain not taking anything for it or go to pain  management    Inflammatory markers trend (sed rate/crp) 03/11/24    90    /   12.5 (<1) 01/18/24    58    /    1.9 (<20) 10/06/23   >130 /    3.9 (<20) 09/23/23   132   /   21.9 (<1)  Imaging: 03/11/24 abd pelv ct with contrast 1. 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 2. Stable fluid and soft tissue gas in the presacral region and along the medial left gluteal region/ischiorectal fossa. No new abscess. 3. No cortical destruction to suggest sacral osteomyelitis.  11/14/23 ct abd pelv with contrast Phoebe is again noted in place. Previously seen fluid collections in the pelvis have decreased in the interval from the prior exam now containing air consistent with continued improvement. No new fluid collection is identified. Fluid is noted throughout the colon consistent with the diarrheal state. Mild bibasilar atelectatic changes.  09/19/23 mri lumbar spine 1. Evidence of lower Sacral Osteomyelitis with presacral Abscess, Phlegmon: Partially visible abnormal sacrum beginning at the S3 body with marrow edema and enhancement. And abnormal presacral soft tissue fluid, edema, and enhancement. See further details on Pelvis MRI today reported  separately.   2. No acute or inflammatory process identified in the lumbar spine. No lumbar spinal stenosis.   09/19/23 pelvis mri 1. Enlarging presacral fluid collection with peripheral enhancement suspicious for an abscess. There is asymmetric extension of peripherally enhancing fluid into the posterior aspect of the left greater sciatic notch. 2. Increased marrow T2 hyperintensity and enhancement within the adjacent mid to distal sacrum and coccyx suspicious for early osteomyelitis. 3. Presacral inflammatory changes extend into the adjacent piriformis and gluteus maximus muscles bilaterally with associated small intramuscular fluid collections. 4. No evidence of sacroiliitis or hip joint septic arthritis. 5. In addition to the perianal fistula with seton tube in place, there appears to be a fistula extending from the left side of the rectum (image [id number] series 13) at about the 3 o'clock position approximately 5 cm cephalad to the anorectal junction, and extending directly posteriorly into the complex presacral collection. The collection has small abscess like extensions extending along the gluteal musculature. Multiple likely reactive perirectal lymph nodes are present. There is primarily lower rectal diffuse wall thickening extending down to the anorectal junction and probably into the anus. The upper rectum does not appear substantially thickened.    On presentation afebrile; wbc 16 although the reactive thrombocytosis had resolved Hg is also lower 8.90 vs 10-11 baseline Cr is slightly increased but remains within baseline range 1.4 (1.2-1.8) Alkaline phosphatase is not elevated  As mentioned above, elevated inflammatory markers      Family History  Problem Relation Age of Onset   Diabetes Mother    Kidney failure Mother    Diabetes Sister    Colon cancer Neg Hx    Stomach cancer Neg Hx    Esophageal cancer Neg Hx    Pancreatic disease Neg Hx    Colon polyps Neg Hx     Rectal cancer Neg Hx     Social History   Tobacco Use   Smoking status: Former    Current packs/day: 0.25    Types: Cigarettes   Smokeless tobacco: Never  Vaping Use   Vaping status: Never Used  Substance Use Topics   Alcohol use: Not Currently    Comment: occ   Drug use: No    Allergies  Allergen Reactions   Shrimp [Shellfish Allergy] Shortness Of Breath   Nsaids Other (See Comments)    CROHN'S DISEASE = NO NSAIDS    Review of Systems: ROS All Other ROS was negative, except mentioned above   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   PO B12 initiated 12/31/20   Anxiety    Arthritis    Chronic kidney disease    Crohn's colitis (HCC)    Depression    Dilated cardiomyopathy (HCC) 05/18/2013   By catheterization 2014    Fistula    GERD (gastroesophageal reflux disease)    Hypertension    Neuromuscular disorder (HCC)    Osteomyelitis (HCC)    tailbone   Pneumonia        Scheduled Meds:  cyanocobalamin   1,000 mcg Intramuscular Once   enoxaparin  (LOVENOX ) injection  40 mg Subcutaneous Q24H   polyethylene glycol  17 g Oral Daily   Continuous Infusions:  lactated ringers  50 mL/hr at 03/11/24 1045   PRN Meds:.acetaminophen  **OR** acetaminophen , albuterol , morphine  injection, ondansetron  **OR** ondansetron  (ZOFRAN ) IV, oxyCODONE    OBJECTIVE: Blood pressure (!) 103/59, pulse 62, temperature 98.2 F (36.8 C), temperature source Oral, resp. rate 16, height 6' 1 (1.854 m), weight 80.9 kg, SpO2 99%.  Physical Exam  General/constitutional: no distress, pleasant, well developed HEENT: Normocephalic, PER, Conj Clear, EOMI, Oropharynx clear Neck supple CV: rrr no mrg Lungs: clear to auscultation, normal respiratory effort Abd: Soft, Nontender Ext: no edema Skin: No Rash Neuro: nonfocal MSK: no peripheral joint  swelling/tenderness/warmth; back spines nontender  GU: reviewed GI's finding   Lab Results Lab Results  Component Value Date   WBC 15.9 (H) 03/11/2024   HGB 8.9 (L) 03/11/2024   HCT 28.4 (L) 03/11/2024   MCV 85.8 03/11/2024   PLT 378 03/11/2024    Lab Results  Component Value Date   CREATININE 1.41 (H) 03/11/2024   BUN 21 (H) 03/11/2024   NA 135 03/11/2024   K 4.3 03/11/2024   CL 109 03/11/2024   CO2 20 (L) 03/11/2024    Lab Results  Component Value Date   ALT 16 03/11/2024   AST 13 (L) 03/11/2024   ALKPHOS 80 03/11/2024   BILITOT 0.5 03/11/2024      Microbiology: No results found for this or any previous visit (from the past 240 hours).   Serology:    Imaging: If present, new imagings (plain films, ct scans, and mri) have been personally visualized and interpreted; radiology reports have been reviewed. Decision making incorporated into the Impression / Recommendations.  03/11/24 abd pelv ct with contrast 1. 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 2. Stable fluid and soft tissue gas in the presacral region and along the medial left gluteal region/ischiorectal fossa. No new abscess. 3. No cortical destruction to suggest sacral osteomyelitis.  11/14/23 ct abd pelv with contrast Phoebe is again noted in place. Previously seen fluid collections in the pelvis have decreased in the interval from the prior exam now containing air consistent with continued improvement. No new fluid collection is identified. Fluid is noted throughout the colon consistent with the diarrheal state. Mild bibasilar atelectatic changes.  09/19/23 mri lumbar spine 1. Evidence of lower Sacral Osteomyelitis with presacral Abscess, Phlegmon: Partially visible abnormal sacrum beginning at the S3 body with marrow edema and enhancement. And abnormal presacral soft tissue fluid, edema, and enhancement. See further details on Pelvis MRI today reported separately.   2. No acute or inflammatory process  identified in the lumbar spine. No lumbar spinal stenosis.   09/19/23 pelvis mri 1. Enlarging presacral fluid collection with peripheral enhancement suspicious for an abscess. There is asymmetric extension of peripherally enhancing fluid into the posterior aspect of the left greater sciatic notch. 2. Increased marrow T2 hyperintensity and enhancement within the adjacent mid to distal sacrum and coccyx suspicious for early osteomyelitis. 3. Presacral inflammatory changes extend into the adjacent piriformis and gluteus maximus muscles bilaterally with associated small intramuscular fluid collections. 4. No evidence of sacroiliitis or hip joint septic arthritis. 5. In addition to the perianal fistula with seton tube in place, there appears to be a fistula extending from the left side of the rectum (image 36-40 series 13) at about the 3 o'clock position approximately 5 cm cephalad to the anorectal junction, and extending directly posteriorly into the complex presacral collection. The collection has small abscess like extensions extending along the gluteal musculature. Multiple likely reactive perirectal lymph nodes are present. There is primarily lower rectal diffuse wall thickening extending down to the anorectal junction and probably into the anus. The upper rectum does not appear substantially thickened.   Constance ONEIDA Passer, MD [facility] [phone] pager    03/11/2024, 3:06 PM

## 2024-03-12 DIAGNOSIS — K651 Peritoneal abscess: Secondary | ICD-10-CM | POA: Diagnosis not present

## 2024-03-12 DIAGNOSIS — K50913 Crohn's disease, unspecified, with fistula: Secondary | ICD-10-CM | POA: Diagnosis not present

## 2024-03-12 DIAGNOSIS — Z79620 Long term (current) use of immunosuppressive biologic: Secondary | ICD-10-CM | POA: Diagnosis not present

## 2024-03-12 DIAGNOSIS — M4628 Osteomyelitis of vertebra, sacral and sacrococcygeal region: Secondary | ICD-10-CM

## 2024-03-12 DIAGNOSIS — K50914 Crohn's disease, unspecified, with abscess: Secondary | ICD-10-CM | POA: Insufficient documentation

## 2024-03-12 DIAGNOSIS — K610 Anal abscess: Secondary | ICD-10-CM | POA: Diagnosis not present

## 2024-03-12 DIAGNOSIS — K50113 Crohn's disease of large intestine with fistula: Secondary | ICD-10-CM | POA: Diagnosis not present

## 2024-03-12 DIAGNOSIS — K6289 Other specified diseases of anus and rectum: Secondary | ICD-10-CM | POA: Diagnosis not present

## 2024-03-12 LAB — CBC
HCT: 27 % — ABNORMAL LOW (ref 39.0–52.0)
Hemoglobin: 8.7 g/dL — ABNORMAL LOW (ref 13.0–17.0)
MCH: 27.7 pg (ref 26.0–34.0)
MCHC: 32.2 g/dL (ref 30.0–36.0)
MCV: 86 fL (ref 80.0–100.0)
Platelets: 359 K/uL (ref 150–400)
RBC: 3.14 MIL/uL — ABNORMAL LOW (ref 4.22–5.81)
RDW: 22.5 % — ABNORMAL HIGH (ref 11.5–15.5)
WBC: 13.7 K/uL — ABNORMAL HIGH (ref 4.0–10.5)
nRBC: 0 % (ref 0.0–0.2)

## 2024-03-12 LAB — BASIC METABOLIC PANEL WITH GFR
Anion gap: 7 (ref 5–15)
BUN: 17 mg/dL (ref 6–20)
CO2: 20 mmol/L — ABNORMAL LOW (ref 22–32)
Calcium: 8.3 mg/dL — ABNORMAL LOW (ref 8.9–10.3)
Chloride: 109 mmol/L (ref 98–111)
Creatinine, Ser: 1.43 mg/dL — ABNORMAL HIGH (ref 0.61–1.24)
GFR, Estimated: >60 mL/min (ref >60)
Glucose, Bld: 93 mg/dL (ref 70–99)
Potassium: 4.7 mmol/L (ref 3.5–5.1)
Sodium: 136 mmol/L (ref 135–145)

## 2024-03-12 MED ORDER — HYDROMORPHONE HCL 1 MG/ML IJ SOLN
1.0000 mg | Freq: Four times a day (QID) | INTRAMUSCULAR | Status: DC | PRN
  Administered 2024-03-12 – 2024-03-14 (×6): 1 mg via INTRAVENOUS
  Filled 2024-03-12 (×5): qty 1

## 2024-03-12 MED ORDER — OXYCODONE HCL 5 MG PO TABS
5.0000 mg | ORAL_TABLET | Freq: Once | ORAL | Status: AC
  Administered 2024-03-12: 5 mg via ORAL
  Filled 2024-03-12: qty 1

## 2024-03-12 MED ORDER — LACTATED RINGERS IV SOLN: INTRAVENOUS | Status: AC

## 2024-03-12 NOTE — Plan of Care (Signed)
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
## 2024-03-12 NOTE — Progress Notes (Signed)
 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 And further more by different scale value (some HS-crp, some regular crp)   Previous culture: 06/2021 ecoli (r amp-sulb; S cipro /flagyl ); not esbl), e faecalis At some point 08/2023 outside ir aspirate culture negative? No hx esbl or pseudomonas     This admission --> His imaging ct doesn't suggest new abscess/new cortical destruction. He is not septic. I am unclear how a Crohn's flare in his case would present (which could contribute to sx mentioned without other evidence of abscess/osteomyelitis     Initial formulation: Given immunosuppression, and multiple prior abx he has high likelihood of MDR organisms and I have higher threshold to start empiric abx. Would rather have good evidence of om and would plan for biopsy to guide therapy   I would also like to see at some point the seton drain be removed as it could be a risk for difficult to treat nonfermenter gram negative rod (pseudomonas, acinetobacter for example). Obviously he'll need his fistula disease resolved prior to removal. He hasn't seen  general surgery since 08/2023     I would check blood cx and pelvis mri --> if both negative would ask GI to comment on crohn's process and at the most I would repeat evaluation to see if we need to pull trigger on dedicated OM diagnosis/treatment     ------------------ 03/12/24 id assessment Pelvis mri done and showed presacral abscess and mri sacral bone edema ?early OM Surgery evaluated patient and ongoing discussion regarding management  7/20 blood cx ngtd Afebrile Mild leukocytosis stable   -continue to hold abx for now -appreciate surgery/GI input -will await if I&D or other methods of sampling possible to guide abx therapy -will await further gi work up regarding crohn's flare -continue standard isolation precaution   Principal Problem:   Rectal pain Active Problems:   B12 deficiency   Allergies  Allergen Reactions   Shrimp [Shellfish Allergy] Shortness Of Breath   Nsaids Other (See Comments)    CROHN'S DISEASE = NO NSAIDS    Scheduled Meds:  enoxaparin  (LOVENOX ) injection  40 mg Subcutaneous Q24H   polyethylene glycol  17 g Oral Daily   Continuous Infusions:  lactated ringers  50 mL/hr at 03/12/24 0906   PRN Meds:.acetaminophen  **OR** acetaminophen , albuterol , ondansetron  **OR** ondansetron  (ZOFRAN ) IV, oxyCODONE    SUBJECTIVE: Ongoing rectal pain Mri pelvis done   Review of Systems: ROS  All other ROS was negative, except mentioned above     OBJECTIVE: Vitals:   03/11/24 1244 03/11/24 2057 03/12/24 0126 03/12/24 0513  BP: (!) 103/59 115/64 108/64 108/68  Pulse: 62 72 75 73  Resp: 16 16 16 14   Temp: 98.2 F (36.8 C) 99.8 F (37.7 C) 99.4 F (37.4 C) 98.4 F (36.9 C)  TempSrc: Oral Oral Oral Oral  SpO2: 99% 100% 98% 99%  Weight:      Height:       Body mass index is 23.53 kg/m.  Physical Exam General/constitutional: no distress, pleasant HEENT: Normocephalic, PER, Conj Clear, EOMI, Oropharynx clear Neck supple CV: rrr no mrg Lungs:  clear to auscultation, normal respiratory effort Abd: Soft, Nontender Ext: no edema Skin: No Rash Neuro: nonfocal MSK: no peripheral joint swelling/tenderness/warmth; back spines nontender    Lab Results Lab Results  Component Value Date   WBC 13.7 (H) 03/12/2024   HGB 8.7 (L) 03/12/2024   HCT 27.0 (L) 03/12/2024   MCV 86.0 03/12/2024   PLT 359 03/12/2024    Lab Results  Component Value Date   CREATININE 1.43 (H) 03/12/2024   BUN 17 03/12/2024   NA 136 03/12/2024   K 4.7 03/12/2024   CL 109 03/12/2024   CO2 20 (L) 03/12/2024    Lab Results  Component Value Date   ALT 16 03/11/2024   AST 13 (L) 03/11/2024   ALKPHOS 80 03/11/2024   BILITOT 0.5 03/11/2024      Microbiology: Recent Results (from the past 240 hours)  Culture, blood (Routine X 2) w Reflex to ID Panel     Status: None (Preliminary result)   Collection Time: 03/11/24  5:33 PM   Specimen: BLOOD  Result Value Ref Range Status   Specimen Description   Final    BLOOD SITE NOT SPECIFIED Performed at [facility], [address], [place], [place] [address]    Special Requests   Final    BOTTLES DRAWN AEROBIC AND ANAEROBIC Blood Culture results may not be optimal due to an inadequate volume of blood received in culture bottles Performed at [facility], [address] [place], [place] [address]    Culture   Final    NO GROWTH < 12 HOURS Performed at [facility], [address] [place], [place] [address]    Report Status PENDING  Incomplete  Culture, blood (Routine X 2) w Reflex to ID Panel     Status: None (Preliminary result)   Collection Time: 03/11/24  5:38 PM   Specimen: BLOOD  Result Value Ref Range Status   Specimen Description   Final    BLOOD SITE NOT SPECIFIED Performed at [facility], [address], [place], [place] [address]    Special Requests   Final    BOTTLES DRAWN AEROBIC AND ANAEROBIC Blood Culture results may not be optimal due to an  inadequate volume of blood received in culture bottles Performed at [facility], [address], [place], [place] [address]    Culture   Final    NO GROWTH < 12 HOURS Performed at [facility], [address] [place], [place] [address]    Report Status PENDING  Incomplete     Serology:   Imaging: If present, new imagings (plain films, ct scans, and mri) have been personally visualized and interpreted; radiology reports have been reviewed. Decision making incorporated into the Impression / Recommendations.  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 Low-grade marrow edema in S2 through S5 segments of the sacrum, probably from early osteomyelitis. No SI joint effusion or hip joint effusion.   MUSCULOTENDINOUS:   Presacral abscesses are observed with abnormal enhancement extending into the anterior S2, S3, and S4 foramina. The main portion of the conglomerate abscess measures about 6.7 by 1.1 by 4.4 cm (volume = 17 cm^3) although this abscess may be multiloculated. Small abscesses are also present along the anterior margin of the left gluteus maximus muscle, again potentially loculated but measuring about 6.0 by 1.5 by 2.3 cm as shown on image 30 series 10. Local myositis noted in the bilateral piriformis musculature (especially medially) and in the medial gluteus maximus musculature.   Mild edema and enhancement in the lower posterior paraspinal musculature (erector spinae and longissimus thoracis muscles) bilaterally could reflect early myositis.   SACRAL PLEXUS:   Abscess noted extending along particularly the S3 and S4 components the sacral plexus. Accentuated enhancement of the sciatic nerves, left greater than right, suspicious for sciatic neuropathy/inflammation.   OTHER:   On image 23 series 11 there appears to be a seton in the posterior perianal space.    Scattered perirectal lymph nodes are likely inflammatory.   Today's exam was not optimized specifically look at the anal canal or for perianal fistula, the orthopedic pelvis protocol was utilized.   IMPRESSION: 1. 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 2. Low-grade marrow edema in S2 through S5 segments of the sacrum, probably from early osteomyelitis. 3. Presacral abscesses with abnormal enhancement extending into the anterior S2, S3, and S4 foramina. The main portion of the conglomerate abscess measures about 6.7 by 1.1 by 4.4 cm (volume = 17 cm^ 3) although this abscess may be multiloculated. Small abscesses are also present along the anterior margin of the left gluteus maximus muscle, again potentially loculated but measuring about 6.0 by 1.5 by 2.3 cm. 4. Local myositis in the bilateral piriformis musculature (especially medially) and in the medial gluteus maximus musculature. 5. Mild edema and enhancement in the lower posterior paraspinal musculature (erector spinae and longissimus thoracis muscles) bilaterally could reflect early myositis. 6. Accentuated enhancement of the sciatic nerves, left greater than right, suspicious for sciatic neuropathy/inflammation. 7. There appears to be a seton in the posterior perianal space. Scattered perirectal lymph nodes are likely inflammatory. Today's exam was not optimized specifically look at the anal canal or for perianal fistula, the orthopedic pelvis protocol was utilized.     7/20 ct abd pelv IMPRESSION: 1. 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 2. Stable fluid and soft tissue gas in the presacral region and along the medial left gluteal region/ischiorectal fossa. No new abscess. 3. No cortical destruction to suggest sacral osteomyelitis.  Constance ONEIDA Passer, MD [facility] [phone] pager    03/12/2024, 1:24 PM

## 2024-03-12 NOTE — Plan of Care (Signed)

## 2024-03-12 NOTE — Progress Notes (Addendum)
 [facility] Progress Note  CC:  Rectal pain, Crohn's disease   Subjective: He remains on a clear liquid diet.  No nausea or vomiting.  No abdominal pain.  He continues to have the same level of tailbone and anal rectal pain.  He continues to pass a moderate amount of yellowish drainage from his seton. Last bowel movement was days ago.  No diarrhea. No bloody or black stools.  No known family history of IBD.  No history of MSM.   Objective:  Vital signs in last 24 hours: Temp:  [98.2 F (36.8 C)-99.8 F (37.7 C)] 98.4 F (36.9 C) (07/21 0513) Pulse Rate:  [62-75] 73 (07/21 0513) Resp:  [14-16] 14 (07/21 0513) BP: (103-133)/(59-74) 108/68 (07/21 0513) SpO2:  [98 %-100 %] 99 % (07/21 0513) Weight:  [80.9 kg] 80.9 kg (07/20 1200)   General: 38 year old male in no acute distress. Heart: Regular rate and rhythm, no murmurs. Pulm: Breath sounds clear throughout. Abdomen: Soft, nondistended.  Nontender.  No palpable masses.  Positive bowel sounds to all 4 quadrants. No hepatomegaly. Extremities: No lower extremity edema. Neurologic:  Alert and  oriented x 4. Grossly normal neurologically. Psych:  Alert and cooperative. Normal mood and affect.  Intake/Output from previous day: 07/20 0701 - 07/21 0700 In: 2439 [P.O.:1560; I.V.:879] Out: -  Intake/Output this shift: No intake/output data recorded.  Lab Results: Recent Labs    03/11/24 0001 03/12/24 0427  WBC 15.9* 13.7*  HGB 8.9* 8.7*  HCT 28.4* 27.0*  PLT 378 359   BMET Recent Labs    03/11/24 0001 03/12/24 0427  NA 135 136  K 4.3 4.7  CL 109 109  CO2 20* 20*  GLUCOSE 89 93  BUN 21* 17  CREATININE 1.41* 1.43*  CALCIUM  8.2* 8.3*   LFT Recent Labs    03/11/24 0001  PROT 7.7  ALBUMIN  3.0*  AST 13*  ALT 16  ALKPHOS 80  BILITOT 0.5   PT/INR No results for input(s): LABPROT, INR in the last 72 hours. Hepatitis Panel No results for input(s): HEPBSAG, HCVAB, HEPAIGM, HEPBIGM in  the last 72 hours.  CT ABDOMEN PELVIS W CONTRAST Result Date: 03/11/2024 EXAM: CT ABDOMEN AND PELVIS WITH CONTRAST 03/11/2024 01:33:53 AM TECHNIQUE: CT of the abdomen and pelvis was performed with the administration of intravenous contrast. Multiplanar reformatted images are provided for review. Automated exposure control, iterative reconstruction, and/or weight based adjustment of the mA/kV was utilized to reduce the radiation dose to as low as reasonably achievable. 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 recurrence of osteomyelitis of sacrum/coccyx. FINDINGS: LOWER CHEST: No acute abnormality. LIVER: The liver is unremarkable. GALLBLADDER AND BILE DUCTS: Gallbladder is unremarkable. No biliary ductal dilatation. SPLEEN: No acute abnormality. PANCREAS: No acute abnormality. ADRENAL GLANDS: No acute abnormality. KIDNEYS, URETERS AND BLADDER: Numerous bilateral renal cysts measuring up to 4.2 cm in the anterior left lower kidney, benign (Bosniak 1). No follow up is recommended. No stones in the kidneys or ureters. No hydronephrosis. No perinephric or periureteral stranding. Urinary bladder is unremarkable. GI AND BOWEL: Stomach demonstrates no acute abnormality. There is no bowel obstruction. No bowel wall thickening. Normal appendix. 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 PERITONEUM AND RETROPERITONEUM: Stable fluid and soft tissue gas in the presacral region (image 77) and along the medial left gluteal region/ischiorectal fossa (image 91). VASCULATURE: Aorta is normal in caliber. LYMPH NODES: No lymphadenopathy. REPRODUCTIVE ORGANS: No acute abnormality. BONES AND SOFT TISSUES: No cortical destruction to  suggest sacral osteomyelitis. IMPRESSION: 1. 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 2. Stable fluid and soft tissue gas in the presacral region and along the medial left gluteal region/ischiorectal fossa. No new abscess. 3. No cortical destruction to suggest sacral osteomyelitis. 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 On Ustekinumab , last injection was 7/16. CTAP 7/20 showed stable fluid and soft tissue gas in the presacral region and along the medial left gluteal region without abscess and no evidence of sacral osteomyelitis. Evaluated by ID for suspected recurrent coccygeal osteomyelitis, holding off on antibiotics pending pelvic MRI results. Sed rate 90.WBC 15.9 -> 13.7. CRP 12.5. Ustekinumab  drug level therapeutic with antibody level < 40 on 01/18/2024. HIV negative 09/19/2023.  Denies history of MSM. His most recent colonoscopy 03/2023 showed inflammation from the anus to the rectum, moderate in severity, improved when compared to prior procedure and colon biopsies were negative for active colitis. Hemodynamically stable.  -Continue clear liquid diet for now -Await pelvic MRI results  -Await blood culture results   -Await general surgery recommendations -Consider flex sigmoidoscopy to assess Crohn's activity and to rule out CMV. Defer endoscopic recommendations to Dr. [name]. -May need to switch biologic therapy  -Pain management and IV fluids per the hospitalist  -Miralax  Q HS as needed  Anemia, secondary to chronic disease with prior iron  deficiency + current vitamin B 12 deficiency,  recently evaluated by hematology as an outpatient, B12 injections were ordered Q 4 weeks but were he missed his first injection appointment. Admission Hg 8.9 (Hg 10.9 three weeks ago) -> 8.7. MCV 86. Normal iron  levels and B12 level 105 on 02/14/2024.  -Continue vitamin B 12 injections  -CBC in AM -Transfuse for Hg < 7 or as needed if symptomatic   CKD. Cr 1.41 -> 1.43   Principal Problem:   Rectal pain Active Problems:   B12 deficiency     LOS: 1 day   Elida CHRISTELLA Shawl  03/12/2024, 8:25 AM

## 2024-03-12 NOTE — Progress Notes (Signed)
 PROGRESS NOTE    Shawn [name]  FMW:[id number] DOB: [birth year] DOA: 03/10/2024 PCP: [name], FNP   Brief Narrative:  This 38 yrs old male with medical history significant for Crohn's disease and associated perirectal abscess x 2, sacral osteomyelitis, CKD, dilated cardiomyopathy presented in the ED with rectal pain of unclear etiology.  Patient states that for the last week or so, he has been having increasing sacral and coccygeal as well as perianal pain, He has not had any fever but has had some subjective chills and sweats.  No nausea or vomiting, no abdominal pain per se.  Workup in the emergency department as detailed below is relatively unremarkable for any new findings, Patient was admitted for pain control.  Gastroenterology surgery and infectious diseases has been consulted.   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 Continue Pain and nausea control as needed Continue Clear liquid diet GI consulted.  Awaiting pelvic MRI results, awaiting blood culture results. GI planning to consider flexible sigmoidoscopy to assess Crohn's activity and to rule out CMV.  Patient may need to switch biologic therapy. General surgery consulted, patient does have fistulizing Crohn's disease which is generally never fully resolved. Patient needs anorectal surgery evaluation.   Crohn's disease: Continue Stelara  monthly   CKD stage III: Renal functions appears to be at baseline.  History of sacral osteomyelitis: Awaiting pelvic MRI report.   Will defer antibiotics for now.    DVT prophylaxis: Lovenox  Code Status:Full code Family Communication:No family at bed side. Disposition Plan:   Status  is: Inpatient Remains inpatient appropriate because: Patient admitted with Crohn's disease flare with fistulizing drainage and sacral osteomyelitis.    Consultants:  Surgery Gastroenterology Infectious diseases  Procedures: MRI Pelvis  Antimicrobials:  Anti-infectives (From admission, onward)    None       Subjective: Patient was seen and examined at bedside.  Overnight events noted. Patient complains about having pain and stated pain is not controlled with oxycodone  and asking for IV pain medications.   Objective: Vitals:   03/11/24 1244 03/11/24 2057 03/12/24 0126 03/12/24 0513  BP: (!) 103/59 115/64 108/64 108/68  Pulse: 62 72 75 73  Resp: 16 16 16 14   Temp: 98.2 F (36.8 C) 99.8 F (37.7 C) 99.4 F (37.4 C) 98.4 F (36.9 C)  TempSrc: Oral Oral Oral Oral  SpO2: 99% 100% 98% 99%  Weight:      Height:        Intake/Output Summary (Last 24 hours) at 03/12/2024 1256 Last data filed at 03/12/2024 1000 Gross per 24 hour  Intake 2723.78 ml  Output --  Net 2723.78 ml   Filed Weights   03/11/24 1200  Weight: 80.9 kg    Examination:  General exam: Appears calm and comfortable, not in any acute distress. Respiratory system: Clear to auscultation. Respiratory effort normal.  RR 16 Cardiovascular system: S1 & S2 heard, RRR. No JVD, murmurs, rubs, gallops or clicks.  Gastrointestinal system: Abdomen is nondistended, soft and nontender.  Normal bowel sounds heard. Central nervous system: Alert and oriented x 3. No focal neurological deficits. Extremities: No edema, no cyanosis, no clubbing. Skin: No rashes, lesions or ulcers Psychiatry: Judgement and insight appear normal. Mood &  affect appropriate.     Data Reviewed: I have personally reviewed following labs and imaging studies  CBC: Recent Labs  Lab 03/11/24 0001 03/12/24 0427  WBC 15.9* 13.7*  NEUTROABS 11.2*  --   HGB 8.9* 8.7*  HCT 28.4* 27.0*  MCV 85.8 86.0  PLT 378 359   Basic Metabolic  Panel: Recent Labs  Lab 03/11/24 0001 03/12/24 0427  NA 135 136  K 4.3 4.7  CL 109 109  CO2 20* 20*  GLUCOSE 89 93  BUN 21* 17  CREATININE 1.41* 1.43*  CALCIUM  8.2* 8.3*   GFR: Estimated Creatinine Clearance: 78.4 mL/min (A) (by C-G formula based on SCr of 1.43 mg/dL (H)). Liver Function Tests: Recent Labs  Lab 03/11/24 0001  AST 13*  ALT 16  ALKPHOS 80  BILITOT 0.5  PROT 7.7  ALBUMIN  3.0*   No results for input(s): LIPASE, AMYLASE in the last 168 hours. No results for input(s): AMMONIA in the last 168 hours. Coagulation Profile: No results for input(s): INR, PROTIME in the last 168 hours. Cardiac Enzymes: No results for input(s): CKTOTAL, CKMB, CKMBINDEX, TROPONINI in the last 168 hours. BNP (last 3 results) No results for input(s): PROBNP in the last 8760 hours. HbA1C: No results for input(s): HGBA1C in the last 72 hours. CBG: No results for input(s): GLUCAP in the last 168 hours. Lipid Profile: No results for input(s): CHOL, HDL, LDLCALC, TRIG, CHOLHDL, LDLDIRECT in the last 72 hours. Thyroid  Function Tests: No results for input(s): TSH, T4TOTAL, FREET4, T3FREE, THYROIDAB in the last 72 hours. Anemia Panel: No results for input(s): VITAMINB12, FOLATE, FERRITIN, TIBC, IRON , RETICCTPCT in the last 72 hours. Sepsis Labs: No results for input(s): PROCALCITON, LATICACIDVEN in the last 168 hours.  Recent Results (from the past 240 hours)  Culture, blood (Routine X 2) w Reflex to ID Panel     Status: None (Preliminary result)   Collection Time: 03/11/24  5:33 PM   Specimen: BLOOD  Result Value Ref Range Status   Specimen Description   Final    BLOOD SITE NOT SPECIFIED Performed at [facility], [address], [place], [place] [address]    Special Requests   Final    BOTTLES DRAWN AEROBIC AND ANAEROBIC Blood Culture results may not be optimal due to an inadequate volume of blood received in  culture bottles Performed at [facility], [address] [place], [place] [address]    Culture   Final    NO GROWTH < 12 HOURS Performed at [facility], [address], [place], [place] [address]    Report Status PENDING  Incomplete  Culture, blood (Routine X 2) w Reflex to ID Panel     Status: None (Preliminary result)   Collection Time: 03/11/24  5:38 PM   Specimen: BLOOD  Result Value Ref Range Status   Specimen Description   Final    BLOOD SITE NOT SPECIFIED Performed at [facility], [address], [place], [place] [address]    Special Requests   Final    BOTTLES DRAWN AEROBIC AND ANAEROBIC Blood Culture results may not be optimal due to an inadequate volume of blood received in culture bottles Performed at [facility], [address] [place], [place] [address]    Culture   Final    NO GROWTH < 12 HOURS Performed at [facility], [address] [place], [place] [address]    Report Status PENDING  Incomplete  Radiology Studies: MR PELVIS W WO CONTRAST Result Date: 03/12/2024 CLINICAL DATA:  Osteomyelitis or sacral abscess EXAM: MRI PELVIS WITHOUT AND WITH CONTRAST TECHNIQUE: Multiplanar multisequence MR imaging of the pelvis was performed both before and after administration of intravenous contrast. CONTRAST:  8mL GADAVIST  GADOBUTROL  1 MMOL/ML IV SOLN COMPARISON:  CT pelvis 03/11/2024 FINDINGS: OSSEOUS STRUCTURES AND JOINTS: Coccygeal osteomyelitis with erosion of the coccyx noted. Low-grade marrow edema in S2 through S5 segments of the sacrum, probably from early osteomyelitis. No SI joint effusion or hip joint effusion. MUSCULOTENDINOUS: Presacral abscesses are observed with abnormal enhancement extending into the anterior S2, S3, and S4 foramina. The main portion of the conglomerate abscess measures about 6.7 by 1.1 by 4.4 cm (volume = 17 cm^3) although this abscess may be multiloculated. Small abscesses are also  present along the anterior margin of the left gluteus maximus muscle, again potentially loculated but measuring about 6.0 by 1.5 by 2.3 cm as shown on image 30 series 10. Local myositis noted in the bilateral piriformis musculature (especially medially) and in the medial gluteus maximus musculature. Mild edema and enhancement in the lower posterior paraspinal musculature (erector spinae and longissimus thoracis muscles) bilaterally could reflect early myositis. SACRAL PLEXUS: Abscess noted extending along particularly the S3 and S4 components the sacral plexus. Accentuated enhancement of the sciatic nerves, left greater than right, suspicious for sciatic neuropathy/inflammation. OTHER: On image 23 series 11 there appears to be a seton in the posterior perianal space. Scattered perirectal lymph nodes are likely inflammatory. Today's exam was not optimized specifically look at the anal canal or for perianal fistula, the orthopedic pelvis protocol was utilized. IMPRESSION: 1. 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 2. Low-grade marrow edema in S2 through S5 segments of the sacrum, probably from early osteomyelitis. 3. Presacral abscesses with abnormal enhancement extending into the anterior S2, S3, and S4 foramina. The main portion of the conglomerate abscess measures about 6.7 by 1.1 by 4.4 cm (volume = 17 cm^ 3) although this abscess may be multiloculated. Small abscesses are also present along the anterior margin of the left gluteus maximus muscle, again potentially loculated but measuring about 6.0 by 1.5 by 2.3 cm. 4. Local myositis in the bilateral piriformis musculature (especially medially) and in the medial gluteus maximus musculature. 5. Mild edema and enhancement in the lower posterior paraspinal musculature (erector spinae and longissimus thoracis muscles) bilaterally could reflect early myositis. 6. Accentuated enhancement of the sciatic nerves, left greater than right, suspicious for sciatic  neuropathy/inflammation. 7. There appears to be a seton in the posterior perianal space. Scattered perirectal lymph nodes are likely inflammatory. Today's exam was not optimized specifically look at the anal canal or for perianal fistula, the orthopedic pelvis protocol was utilized. Electronically Signed   By: Ryan Salvage M.D.   On: 03/12/2024 09:02   CT ABDOMEN PELVIS W CONTRAST Result Date: 03/11/2024 EXAM: CT ABDOMEN AND PELVIS WITH CONTRAST 03/11/2024 01:33:53 AM TECHNIQUE: CT of the abdomen and pelvis was performed with the administration of intravenous contrast. Multiplanar reformatted images are provided for review. Automated exposure control, iterative reconstruction, and/or weight based adjustment of the mA/kV was utilized to reduce the radiation dose to as low as reasonably achievable. 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 recurrence of osteomyelitis of sacrum/coccyx. FINDINGS: LOWER CHEST: No acute abnormality. LIVER: The liver is unremarkable. GALLBLADDER AND BILE DUCTS: Gallbladder is unremarkable. No biliary ductal dilatation. SPLEEN: No acute abnormality. PANCREAS: No acute abnormality. ADRENAL GLANDS: No acute  abnormality. KIDNEYS, URETERS AND BLADDER: Numerous bilateral renal cysts measuring up to 4.2 cm in the anterior left lower kidney, benign (Bosniak 1). No follow up is recommended. No stones in the kidneys or ureters. No hydronephrosis. No perinephric or periureteral stranding. Urinary bladder is unremarkable. GI AND BOWEL: Stomach demonstrates no acute abnormality. There is no bowel obstruction. No bowel wall thickening. Normal appendix. 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 PERITONEUM AND RETROPERITONEUM: Stable fluid and soft tissue gas in the presacral region (image 77) and along the medial left gluteal region/ischiorectal fossa (image 91). VASCULATURE: Aorta is normal in caliber. LYMPH NODES: No lymphadenopathy. REPRODUCTIVE ORGANS: No acute abnormality.  BONES AND SOFT TISSUES: No cortical destruction to suggest sacral osteomyelitis. IMPRESSION: 1. 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 2. Stable fluid and soft tissue gas in the presacral region and along the medial left gluteal region/ischiorectal fossa. No new abscess. 3. No cortical destruction to suggest sacral osteomyelitis. Electronically signed by: Pinkie Pebbles MD 03/11/2024 01:46 AM EDT RP Workstation: [id number]   Scheduled Meds:  enoxaparin  (LOVENOX ) injection  40 mg Subcutaneous Q24H   polyethylene glycol  17 g Oral Daily   Continuous Infusions:  lactated ringers  50 mL/hr at 03/12/24 0906     LOS: 1 day    Time spent: 50 Mins    Darcel Dawley, MD [facility]   If 7PM-7AM, please contact night-coverage

## 2024-03-12 NOTE — Consult Note (Signed)
 Shawn [name] 01/25/1986  [id number].    Requesting MD: Darcel Dawley Chief Complaint/Reason for Consult: perianal crohn's disease  HPI:  38 yo male with persistent severe buttock and perianal pain. He has crohn's disease and underwent abscess drainage in 2022. After that he underwent coccygectomy and seton placement in 2022 and 1 additional EUA and seton placement in 2023. He has continued to have persistent pain in the area. He has been treated for osteomyelitis and   ROS: Review of Systems  Constitutional: Negative.   HENT: Negative.    Eyes: Negative.   Respiratory: Negative.    Cardiovascular: Negative.   Gastrointestinal: Negative.        Perianal pain  Genitourinary: Negative.   Musculoskeletal: Negative.   Skin: Negative.   Neurological: Negative.   Endo/Heme/Allergies: Negative.   Psychiatric/Behavioral: Negative.      Family History  Problem Relation Age of Onset   Diabetes Mother    Kidney failure Mother    Diabetes Sister    Colon cancer Neg Hx    Stomach cancer Neg Hx    Esophageal cancer Neg Hx    Pancreatic disease Neg Hx    Colon polyps Neg Hx    Rectal cancer Neg Hx     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   PO B12 initiated 12/31/20   Anxiety    Arthritis    Chronic kidney disease    Crohn's colitis (HCC)    Depression    Dilated cardiomyopathy (HCC) 05/18/2013   By catheterization 2014    Fistula    GERD (gastroesophageal reflux disease)    Hypertension    Neuromuscular disorder (HCC)    Osteomyelitis (HCC)    tailbone   Pneumonia     Past Surgical History:  Procedure Laterality Date   BIOPSY  01/01/2021   Procedure: BIOPSY;  Surgeon: Aneita Gwendlyn DASEN, MD;  Location: [facility] ENDOSCOPY;  Service: Endoscopy;;   COCCYGECTOMY  07/07/2021   COLONOSCOPY WITH PROPOFOL  N/A 01/01/2021   Procedure: COLONOSCOPY WITH  PROPOFOL ;  Surgeon: Aneita Gwendlyn DASEN, MD;  Location: [facility] ENDOSCOPY;  Service: Endoscopy;  Laterality: N/A;   INCISION AND DRAINAGE ABSCESS N/A 07/07/2021   Procedure: MODIFIED HANLEY PROCEDURE, DRAINAGE WITH SETON PLACEMENT;  Surgeon: [name], MD;  Location: [facility] ORS;  Service: General;  Laterality: N/A;   INCISION AND DRAINAGE ABSCESS  05/04/2017   right groin   INCISION AND DRAINAGE PERIRECTAL ABSCESS  04/21/2021   INCISION AND DRAINAGE PERIRECTAL ABSCESS  05/18/2013   LEFT AND RIGHT HEART CATHETERIZATION WITH CORONARY ANGIOGRAM N/A 11/21/2012   Procedure: LEFT AND RIGHT HEART CATHETERIZATION WITH CORONARY ANGIOGRAM;  Surgeon: Salena GORMAN Negri, MD;  Location: [facility] CATH LAB;  Service: Cardiovascular;  Laterality: N/A;   MANDIBLE FRACTURE SURGERY  08/23/2004   PLACEMENT OF SETON N/A 07/07/2021   Procedure: PLACEMENT OF SETON;  Surgeon: [name], MD;  Location: [facility] ORS;  Service: General;  Laterality: N/A;   PLACEMENT OF SETON N/A 01/15/2022   Procedure: PLACEMENT OF SETON;  Surgeon: [name], MD;  Location: [facility] ORS;  Service: General;  Laterality: N/A;   RECTAL EXAM UNDER ANESTHESIA N/A 01/15/2022   Procedure: RECTAL EXAM UNDER ANESTHESIA;  Surgeon: [name], MD;  Location: [facility] ORS;  Service: General;  Laterality: N/A;  TRANSRECTAL DRAINAGE REPLACEMENT OF SETON EXCISION OF PERINEAL SINUS TRACT ANORECTAL EXAM UNDER ANESTHESIA  Social History:  reports that he has quit smoking. His smoking use included cigarettes. He has never used smokeless tobacco. He reports that he does not currently use alcohol. He reports that he does not use drugs.  Allergies:  Allergies  Allergen Reactions   Shrimp [Shellfish Allergy] Shortness Of Breath   Nsaids Other (See Comments)    CROHN'S DISEASE = NO NSAIDS    Medications Prior to Admission  Medication Sig Dispense Refill   ibuprofen  (ADVIL ) 200 MG tablet Take 200 mg by mouth every 6 (six) hours as needed for moderate pain (pain score 4-6).      senna (SENOKOT) 8.6 MG TABS tablet Take 2 tablets (17.2 mg total) by mouth daily. (Patient taking differently: Take 2 tablets by mouth as needed.) 120 tablet 0   ustekinumab  (STELARA ) 90 MG/ML SOSY injection Inject 1 mL (90 mg total) into the skin every 28 (twenty-eight) days. 3 mL 3   acetaminophen  (TYLENOL ) 500 MG tablet Take 2 tablets (1,000 mg total) by mouth every 8 (eight) hours as needed for moderate pain. (Patient not taking: Reported on 03/10/2024) 30 tablet 0   acetaminophen -codeine  (TYLENOL  #3) 300-30 MG tablet Take 1-2 tablets by mouth every 6 (six) hours as needed for moderate pain (pain score 4-6). (Patient not taking: Reported on 03/10/2024) 20 tablet 0   Vitamin D , Ergocalciferol , (DRISDOL ) 1.25 MG (50000 UNIT) CAPS capsule Take 1 capsule (50,000 Units total) by mouth every 7 (seven) days. 8 capsule 0   Physical Exam: Blood pressure 108/68, pulse 73, temperature 98.4 F (36.9 C), temperature source Oral, resp. rate 14, height 6' 1 (1.854 m), weight 80.9 kg, SpO2 99%. Gen: NAD Resp: nonlabored CV: RRR Abd: soft, Neuro: AOx4 GU: seton in place with some granulation tissue posteriorly, minimal drainage, anterior external openings.  Results for orders placed or performed during the hospital encounter of 03/10/24 (from the past 48 hours)  Comprehensive metabolic panel     Status: Abnormal   Collection Time: 03/11/24 12:01 AM  Result Value Ref Range   Sodium 135 135 - 145 mmol/L   Potassium 4.3 3.5 - 5.1 mmol/L   Chloride 109 98 - 111 mmol/L   CO2 20 (L) 22 - 32 mmol/L   Glucose, Bld 89 70 - 99 mg/dL    Comment: Glucose reference range applies only to samples taken after fasting for at least 8 hours.   BUN 21 (H) 6 - 20 mg/dL   Creatinine, Ser 8.58 (H) 0.61 - 1.24 mg/dL   Calcium  8.2 (L) 8.9 - 10.3 mg/dL   Total Protein 7.7 6.5 - 8.1 g/dL   Albumin  3.0 (L) 3.5 - 5.0 g/dL   AST 13 (L) 15 - 41 U/L   ALT 16 0 - 44 U/L   Alkaline Phosphatase 80 38 - 126 U/L   Total  Bilirubin 0.5 0.0 - 1.2 mg/dL   GFR, Estimated >39 >39 mL/min    Comment: (NOTE) Calculated using the CKD-EPI Creatinine Equation (2021)    Anion gap 6 5 - 15    Comment: Performed at [facility], [address], [place], [place] [address]  CBC with Differential     Status: Abnormal   Collection Time: 03/11/24 12:01 AM  Result Value Ref Range   WBC 15.9 (H) 4.0 - 10.5 K/uL   RBC 3.31 (L) 4.22 - 5.81 MIL/uL   Hemoglobin 8.9 (L) 13.0 - 17.0 g/dL   HCT 71.5 (L) 60.9 - 47.9 %   MCV 85.8 80.0 - 100.0  fL   MCH 26.9 26.0 - 34.0 pg   MCHC 31.3 30.0 - 36.0 g/dL   RDW 77.2 (H) 88.4 - 84.4 %   Platelets 378 150 - 400 K/uL   nRBC 0.0 0.0 - 0.2 %   Neutrophils Relative % 70 %   Neutro Abs 11.2 (H) 1.7 - 7.7 K/uL   Lymphocytes Relative 21 %   Lymphs Abs 3.3 0.7 - 4.0 K/uL   Monocytes Relative 5 %   Monocytes Absolute 0.9 0.1 - 1.0 K/uL   Eosinophils Relative 3 %   Eosinophils Absolute 0.4 0.0 - 0.5 K/uL   Basophils Relative 0 %   Basophils Absolute 0.1 0.0 - 0.1 K/uL   Immature Granulocytes 1 %   Abs Immature Granulocytes 0.08 (H) 0.00 - 0.07 K/uL    Comment: Performed at [facility], [address], [place], [place] [address]  Sedimentation rate     Status: Abnormal   Collection Time: 03/11/24 12:01 AM  Result Value Ref Range   Sed Rate 90 (H) 0 - 16 mm/hr    Comment: Performed at [facility], [address] [place], [place] [address]  C-reactive protein     Status: Abnormal   Collection Time: 03/11/24 12:01 AM  Result Value Ref Range   CRP 12.5 (H) <1.0 mg/dL    Comment: Performed at [facility], [address], [place], [place] [address]  Culture, blood (Routine X 2) w Reflex to ID Panel     Status: None (Preliminary result)   Collection Time: 03/11/24  5:33 PM   Specimen: BLOOD  Result Value Ref Range   Specimen Description      BLOOD SITE NOT SPECIFIED Performed at [facility], [address]  [place], [place] [address]    Special Requests      BOTTLES DRAWN AEROBIC AND ANAEROBIC Blood Culture results may not be optimal due to an inadequate volume of blood received in culture bottles Performed at [facility], [address], [place], [place] [address]    Culture      NO GROWTH < 12 HOURS Performed at [facility], [address], [place], [place] [address]    Report Status PENDING   Culture, blood (Routine X 2) w Reflex to ID Panel     Status: None (Preliminary result)   Collection Time: 03/11/24  5:38 PM   Specimen: BLOOD  Result Value Ref Range   Specimen Description      BLOOD SITE NOT SPECIFIED Performed at [facility], [address], [place], [place] [address]    Special Requests      BOTTLES DRAWN AEROBIC AND ANAEROBIC Blood Culture results may not be optimal due to an inadequate volume of blood received in culture bottles Performed at [facility], [address] [place], [place] [address]    Culture      NO GROWTH < 12 HOURS Performed at [facility], [address], [place], [place] [address]    Report Status PENDING   Basic metabolic panel     Status: Abnormal   Collection Time: 03/12/24  4:27 AM  Result Value Ref Range   Sodium 136 135 - 145 mmol/L   Potassium 4.7 3.5 - 5.1 mmol/L   Chloride 109 98 - 111 mmol/L   CO2 20 (L) 22 - 32 mmol/L   Glucose, Bld 93 70 - 99 mg/dL    Comment:  Glucose reference range applies only to samples taken after fasting for at least 8 hours.   BUN 17 6 - 20 mg/dL   Creatinine, Ser 8.56 (H) 0.61 - 1.24 mg/dL   Calcium  8.3 (L) 8.9 - 10.3 mg/dL   GFR, Estimated >39 >39 mL/min    Comment: (NOTE) Calculated using the CKD-EPI Creatinine Equation (2021)    Anion gap 7 5 - 15    Comment: Performed at [facility], [address] [place], [place] [address]  CBC     Status: Abnormal   Collection Time: 03/12/24  4:27 AM  Result Value Ref Range   WBC 13.7  (H) 4.0 - 10.5 K/uL   RBC 3.14 (L) 4.22 - 5.81 MIL/uL   Hemoglobin 8.7 (L) 13.0 - 17.0 g/dL   HCT 72.9 (L) 60.9 - 47.9 %   MCV 86.0 80.0 - 100.0 fL   MCH 27.7 26.0 - 34.0 pg   MCHC 32.2 30.0 - 36.0 g/dL   RDW 77.4 (H) 88.4 - 84.4 %   Platelets 359 150 - 400 K/uL   nRBC 0.0 0.0 - 0.2 %    Comment: Performed at [facility], [address] [place], [place] [address]   MR PELVIS W WO CONTRAST Result Date: 03/12/2024 CLINICAL DATA:  Osteomyelitis or sacral abscess EXAM: MRI PELVIS WITHOUT AND WITH CONTRAST TECHNIQUE: Multiplanar multisequence MR imaging of the pelvis was performed both before and after administration of intravenous contrast. CONTRAST:  8mL GADAVIST  GADOBUTROL  1 MMOL/ML IV SOLN COMPARISON:  CT pelvis 03/11/2024 FINDINGS: OSSEOUS STRUCTURES AND JOINTS: Coccygeal osteomyelitis with erosion of the coccyx noted. Low-grade marrow edema in S2 through S5 segments of the sacrum, probably from early osteomyelitis. No SI joint effusion or hip joint effusion. MUSCULOTENDINOUS: Presacral abscesses are observed with abnormal enhancement extending into the anterior S2, S3, and S4 foramina. The main portion of the conglomerate abscess measures about 6.7 by 1.1 by 4.4 cm (volume = 17 cm^3) although this abscess may be multiloculated. Small abscesses are also present along the anterior margin of the left gluteus maximus muscle, again potentially loculated but measuring about 6.0 by 1.5 by 2.3 cm as shown on image 30 series 10. Local myositis noted in the bilateral piriformis musculature (especially medially) and in the medial gluteus maximus musculature. Mild edema and enhancement in the lower posterior paraspinal musculature (erector spinae and longissimus thoracis muscles) bilaterally could reflect early myositis. SACRAL PLEXUS: Abscess noted extending along particularly the S3 and S4 components the sacral plexus. Accentuated enhancement of the sciatic nerves, left greater than right,  suspicious for sciatic neuropathy/inflammation. OTHER: On image 23 series 11 there appears to be a seton in the posterior perianal space. Scattered perirectal lymph nodes are likely inflammatory. Today's exam was not optimized specifically look at the anal canal or for perianal fistula, the orthopedic pelvis protocol was utilized. IMPRESSION: 1. 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 2. Low-grade marrow edema in S2 through S5 segments of the sacrum, probably from early osteomyelitis. 3. Presacral abscesses with abnormal enhancement extending into the anterior S2, S3, and S4 foramina. The main portion of the conglomerate abscess measures about 6.7 by 1.1 by 4.4 cm (volume = 17 cm^ 3) although this abscess may be multiloculated. Small abscesses are also present along the anterior margin of the left gluteus maximus muscle, again potentially loculated but measuring about 6.0 by 1.5 by 2.3 cm. 4. Local myositis in the bilateral piriformis musculature (especially medially) and in the medial gluteus  maximus musculature. 5. Mild edema and enhancement in the lower posterior paraspinal musculature (erector spinae and longissimus thoracis muscles) bilaterally could reflect early myositis. 6. Accentuated enhancement of the sciatic nerves, left greater than right, suspicious for sciatic neuropathy/inflammation. 7. There appears to be a seton in the posterior perianal space. Scattered perirectal lymph nodes are likely inflammatory. Today's exam was not optimized specifically look at the anal canal or for perianal fistula, the orthopedic pelvis protocol was utilized. Electronically Signed   By: Ryan Salvage M.D.   On: 03/12/2024 09:02   CT ABDOMEN PELVIS W CONTRAST Result Date: 03/11/2024 EXAM: CT ABDOMEN AND PELVIS WITH CONTRAST 03/11/2024 01:33:53 AM TECHNIQUE: CT of the abdomen and pelvis was performed with the administration of intravenous contrast. Multiplanar reformatted images are provided for review.  Automated exposure control, iterative reconstruction, and/or weight based adjustment of the mA/kV was utilized to reduce the radiation dose to as low as reasonably achievable. 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 recurrence of osteomyelitis of sacrum/coccyx. FINDINGS: LOWER CHEST: No acute abnormality. LIVER: The liver is unremarkable. GALLBLADDER AND BILE DUCTS: Gallbladder is unremarkable. No biliary ductal dilatation. SPLEEN: No acute abnormality. PANCREAS: No acute abnormality. ADRENAL GLANDS: No acute abnormality. KIDNEYS, URETERS AND BLADDER: Numerous bilateral renal cysts measuring up to 4.2 cm in the anterior left lower kidney, benign (Bosniak 1). No follow up is recommended. No stones in the kidneys or ureters. No hydronephrosis. No perinephric or periureteral stranding. Urinary bladder is unremarkable. GI AND BOWEL: Stomach demonstrates no acute abnormality. There is no bowel obstruction. No bowel wall thickening. Normal appendix. 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 PERITONEUM AND RETROPERITONEUM: Stable fluid and soft tissue gas in the presacral region (image 77) and along the medial left gluteal region/ischiorectal fossa (image 91). VASCULATURE: Aorta is normal in caliber. LYMPH NODES: No lymphadenopathy. REPRODUCTIVE ORGANS: No acute abnormality. BONES AND SOFT TISSUES: No cortical destruction to suggest sacral osteomyelitis. IMPRESSION: 1. 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 2. Stable fluid and soft tissue gas in the presacral region and along the medial left gluteal region/ischiorectal fossa. No new abscess. 3. No cortical destruction to suggest sacral osteomyelitis. Electronically signed by: Pinkie Pebbles MD 03/11/2024 01:46 AM EDT RP Workstation: [id number]    Assessment/Plan 38 yo male with perianal Crohn's disease. This is a very interesting picture with fluid collection/tracking up high present for multiple months. This is all quite high and would be difficult to reach  from EUA. In addition, fistulizing crohn's disease is generally never fully resolved. I have asked my anorectal colleagues for input on this case.  I reviewed last 24 h vitals and pain scores, last 48 h intake and output, last 24 h labs and trends, and last 24 h imaging results.  Herlene Righter [facility] 03/12/2024, 10:57 AM Please see Amion for pager number during day hours 7:00am-4:30pm or 7:00am -11:30am on weekends

## 2024-03-12 NOTE — Progress Notes (Signed)
 Shawn [name], assist department director rounded on pt this morning. Pt's wallet visibly at bedside. Educated on Contractor and encouraged pt to have family/friend bring wallet home. Pt declined.

## 2024-03-13 ENCOUNTER — Other Ambulatory Visit (HOSPITAL_BASED_OUTPATIENT_CLINIC_OR_DEPARTMENT_OTHER): Payer: Self-pay | Admitting: Family Medicine

## 2024-03-13 ENCOUNTER — Other Ambulatory Visit: Payer: Self-pay

## 2024-03-13 DIAGNOSIS — K6289 Other specified diseases of anus and rectum: Secondary | ICD-10-CM | POA: Diagnosis not present

## 2024-03-13 DIAGNOSIS — K611 Rectal abscess: Secondary | ICD-10-CM | POA: Diagnosis not present

## 2024-03-13 DIAGNOSIS — Z79620 Long term (current) use of immunosuppressive biologic: Secondary | ICD-10-CM | POA: Diagnosis not present

## 2024-03-13 DIAGNOSIS — M4628 Osteomyelitis of vertebra, sacral and sacrococcygeal region: Secondary | ICD-10-CM | POA: Diagnosis not present

## 2024-03-13 DIAGNOSIS — M869 Osteomyelitis, unspecified: Secondary | ICD-10-CM

## 2024-03-13 DIAGNOSIS — G8929 Other chronic pain: Secondary | ICD-10-CM

## 2024-03-13 DIAGNOSIS — K50113 Crohn's disease of large intestine with fistula: Secondary | ICD-10-CM | POA: Diagnosis not present

## 2024-03-13 DIAGNOSIS — K50913 Crohn's disease, unspecified, with fistula: Secondary | ICD-10-CM | POA: Diagnosis not present

## 2024-03-13 DIAGNOSIS — K651 Peritoneal abscess: Secondary | ICD-10-CM

## 2024-03-13 LAB — BASIC METABOLIC PANEL WITH GFR
Anion gap: 7 (ref 5–15)
BUN: 14 mg/dL (ref 6–20)
CO2: 22 mmol/L (ref 22–32)
Calcium: 8.2 mg/dL — ABNORMAL LOW (ref 8.9–10.3)
Chloride: 104 mmol/L (ref 98–111)
Creatinine, Ser: 1.39 mg/dL — ABNORMAL HIGH (ref 0.61–1.24)
GFR, Estimated: >60 mL/min (ref >60)
Glucose, Bld: 89 mg/dL (ref 70–99)
Potassium: 5.3 mmol/L — ABNORMAL HIGH (ref 3.5–5.1)
Sodium: 133 mmol/L — ABNORMAL LOW (ref 135–145)

## 2024-03-13 LAB — CBC
HCT: 27.1 % — ABNORMAL LOW (ref 39.0–52.0)
Hemoglobin: 8.6 g/dL — ABNORMAL LOW (ref 13.0–17.0)
MCH: 27.7 pg (ref 26.0–34.0)
MCHC: 31.7 g/dL (ref 30.0–36.0)
MCV: 87.1 fL (ref 80.0–100.0)
Platelets: 359 K/uL (ref 150–400)
RBC: 3.11 MIL/uL — ABNORMAL LOW (ref 4.22–5.81)
RDW: 22.4 % — ABNORMAL HIGH (ref 11.5–15.5)
WBC: 10.4 K/uL (ref 4.0–10.5)
nRBC: 0 % (ref 0.0–0.2)

## 2024-03-13 LAB — PHOSPHORUS: Phosphorus: 3.4 mg/dL (ref 2.5–4.6)

## 2024-03-13 LAB — POTASSIUM: Potassium: 4.9 mmol/L (ref 3.5–5.1)

## 2024-03-13 LAB — MAGNESIUM: Magnesium: 2 mg/dL (ref 1.7–2.4)

## 2024-03-13 MED ORDER — SODIUM CHLORIDE 0.9% FLUSH: 10.0000 mL | INTRAVENOUS | Status: DC | PRN

## 2024-03-13 MED ORDER — SODIUM CHLORIDE 0.9% FLUSH
10.0000 mL | Freq: Two times a day (BID) | INTRAVENOUS | Status: DC
  Administered 2024-03-14 – 2024-03-16 (×6): 10 mL

## 2024-03-13 MED ORDER — CHLORHEXIDINE GLUCONATE CLOTH 2 % EX PADS
6.0000 | MEDICATED_PAD | Freq: Every day | CUTANEOUS | Status: DC
  Administered 2024-03-14 – 2024-03-16 (×2): 6 via TOPICAL

## 2024-03-13 NOTE — Plan of Care (Signed)
 Id brief note   I discussed with GI team dr [name]  -no surgical plan from surgery at this time -I worry that what he has has been lingering around since at least 08/2023. Dr [name] think this is all crohn's related and patient is so far refusing a colonic diversion to help the situation -question of stellara still an effective medication brought up again and at this time it is unclear -patient has surgical debridement last done late 2022 and a seton remains.  -while we can treat with another 6 week course of antibiotics, I worry that if any osteomyelitis present it would be chronic by now and might not respond to abx alone -we would again also would like to see if culture can guide abx and will ask primary team to involve IR for any possible abscess sampling -if no sampling available/possible, can try empiric piptazo for 6 weeks. Failure after 6 weeks would revisit surgical options and diversion and also revisit if stellara is still appropriate for his crohn's -once we start antibiotics for him, there is no absolute contraindication from my standpoint regarding use of stellara. His crohn's control is of priority and he'll be on antibiotics already    -discussed with GI dr [name] and primary team

## 2024-03-13 NOTE — Consult Note (Signed)
 Chief Complaint: Perirectal abscess - referred for image-guided perirectal/presacral  abscess aspiration  Referring Provider(s): Leotis Bogus  Supervising Physician: Vanice Revel  Patient Status: [facility] - In-pt  History of Present Illness: Shawn [name] is a 38 y.o. male with history of acute osteomyelitis, anemia, anxiety, arthritis, chronic kidney disease, Crohn's disease, and recurrent perirectal abscess.  Patient is known to us  from left gluteal abscess aspiration 09/20/2023 with Dr. Jennefer.  Cultures at that time did not grow anything.  Patient has now returned to the hospital with persistent left gluteal abscess.  Interventional radiology was consulted for possible perirectal abscess aspiration.  Imaging was reviewed and approved by Dr. Vanice 03/13/2024 for image guided perirectal /presacral abscess aspiration.   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   PO B12 initiated 12/31/20   Anxiety    Arthritis    Chronic kidney disease    Crohn's colitis (HCC)    Depression    Dilated cardiomyopathy (HCC) 05/18/2013   By catheterization 2014    Fistula    GERD (gastroesophageal reflux disease)    Hypertension    Neuromuscular disorder (HCC)    Osteomyelitis (HCC)    tailbone   Pneumonia     Past Surgical History:  Procedure Laterality Date   BIOPSY  01/01/2021   Procedure: BIOPSY;  Surgeon: Aneita Gwendlyn DASEN, MD;  Location: [facility] ENDOSCOPY;  Service: Endoscopy;;   COCCYGECTOMY  07/07/2021   COLONOSCOPY WITH PROPOFOL  N/A 01/01/2021   Procedure: COLONOSCOPY WITH PROPOFOL ;  Surgeon: Aneita Gwendlyn DASEN, MD;  Location: [facility] ENDOSCOPY;  Service: Endoscopy;  Laterality: N/A;   INCISION AND DRAINAGE ABSCESS N/A 07/07/2021   Procedure: MODIFIED HANLEY PROCEDURE, DRAINAGE WITH SETON PLACEMENT;  Surgeon: [name], MD;  Location: [facility] ORS;   Service: General;  Laterality: N/A;   INCISION AND DRAINAGE ABSCESS  05/04/2017   right groin   INCISION AND DRAINAGE PERIRECTAL ABSCESS  04/21/2021   INCISION AND DRAINAGE PERIRECTAL ABSCESS  05/18/2013   LEFT AND RIGHT HEART CATHETERIZATION WITH CORONARY ANGIOGRAM N/A 11/21/2012   Procedure: LEFT AND RIGHT HEART CATHETERIZATION WITH CORONARY ANGIOGRAM;  Surgeon: Salena GORMAN Negri, MD;  Location: [facility] CATH LAB;  Service: Cardiovascular;  Laterality: N/A;   MANDIBLE FRACTURE SURGERY  08/23/2004   PLACEMENT OF SETON N/A 07/07/2021   Procedure: PLACEMENT OF SETON;  Surgeon: [name], MD;  Location: [facility] ORS;  Service: General;  Laterality: N/A;   PLACEMENT OF SETON N/A 01/15/2022   Procedure: PLACEMENT OF SETON;  Surgeon: [name], MD;  Location: [facility] ORS;  Service: General;  Laterality: N/A;   RECTAL EXAM UNDER ANESTHESIA N/A 01/15/2022   Procedure: RECTAL EXAM UNDER ANESTHESIA;  Surgeon: [name], MD;  Location: [facility] ORS;  Service: General;  Laterality: N/A;  TRANSRECTAL DRAINAGE REPLACEMENT OF SETON EXCISION OF PERINEAL SINUS TRACT ANORECTAL EXAM UNDER ANESTHESIA    Allergies: Shrimp [shellfish allergy] and Nsaids  Medications: Prior to Admission medications   Medication Sig Start Date End Date Taking? Authorizing Provider  ibuprofen  (ADVIL ) 200 MG tablet Take 200 mg by mouth every 6 (six) hours as needed for moderate pain (pain score 4-6).   Yes [provider]  senna (SENOKOT) 8.6 MG TABS tablet Take 2 tablets (17.2 mg total) by mouth daily. Patient taking differently: Take 2 tablets by mouth as needed. 09/27/23  Yes Celestina Czar, MD  ustekinumab  (STELARA ) 90 MG/ML SOSY injection Inject 1 mL (90 mg total) into the skin every 28 (twenty-eight) days. 02/03/24  Yes McGreal, Inocente HERO, MD  acetaminophen  (TYLENOL ) 500 MG tablet Take 2 tablets (1,000 mg total) by mouth every 8 (eight) hours as needed for moderate pain. Patient not taking: Reported on 03/10/2024 02/18/23    Jerral Meth, MD  acetaminophen -codeine  (TYLENOL  #3) 300-30 MG tablet Take 1-2 tablets by mouth every 6 (six) hours as needed for moderate pain (pain score 4-6). Patient not taking: Reported on 03/10/2024 12/29/23   Gretta Bertrum ORN, PA-C  Vitamin D , Ergocalciferol , (DRISDOL ) 1.25 MG (50000 UNIT) CAPS capsule Take 1 capsule (50,000 Units total) by mouth every 7 (seven) days. 01/18/24   McGreal, Inocente HERO, MD     Family History  Problem Relation Age of Onset   Diabetes Mother    Kidney failure Mother    Diabetes Sister    Colon cancer Neg Hx    Stomach cancer Neg Hx    Esophageal cancer Neg Hx    Pancreatic disease Neg Hx    Colon polyps Neg Hx    Rectal cancer Neg Hx     Social History   Socioeconomic History   Marital status: Single    Spouse name: Not on file   Number of children: Not on file   Years of education: Not on file   Highest education level: Associate degree: academic program  Occupational History   Not on file  Tobacco Use   Smoking status: Former    Current packs/day: 0.25    Types: Cigarettes   Smokeless tobacco: Never  Vaping Use   Vaping status: Never Used  Substance and Sexual Activity   Alcohol use: Not Currently    Comment: occ   Drug use: No   Sexual activity: Not on file  Other Topics Concern   Not on file  Social History Narrative   Single - no kids   No tobacco, drugs, alcohol   Front Desk agent [employer]   Social Drivers of Health   Financial Resource Strain: Low Risk  (09/18/2023)   Overall Financial Resource Strain (CARDIA)    Difficulty of Paying Living Expenses: Not hard at all  Food Insecurity: Food Insecurity Present (03/11/2024)   Hunger Vital Sign    Worried About Running Out of Food in the Last Year: Sometimes true    Ran Out of Food in the Last Year: Sometimes true  Transportation Needs: No Transportation Needs (03/11/2024)   PRAPARE - Administrator, Civil Service (Medical): No    Lack of Transportation  (Non-Medical): No  Physical Activity: Sufficiently Active (09/18/2023)   Exercise Vital Sign    Days of Exercise per Week: 7 days    Minutes of Exercise per Session: 30 min  Stress: Stress Concern Present (09/18/2023)   [employer] of Occupational Health - Occupational Stress Questionnaire    Feeling of Stress : Very much  Social Connections: Socially Isolated (03/11/2024)   Social Connection and Isolation Panel    Frequency of Communication with Friends and Family: Three times a week    Frequency of Social Gatherings with Friends and Family: Never    Attends Religious Services: Never    Database administrator or Organizations: No    Attends Engineer, structural: Never    Marital Status: Never married       Review of Systems: denies fever,HA,CP,dyspnea, cough, abd pain,N/V or bleeding; he does have low back  pain  Vital Signs: BP 119/61 (BP Location: Right Arm)   Pulse 67   Temp 99 F (37.2 C) (Oral)   Resp 17   Ht 6' 1 (1.854 m)   Wt 178 lb 5.6 oz (80.9 kg)   SpO2 100%   BMI 23.53 kg/m   Advance Care Plan: no documents on file     Physical Exam: awake/alert; chest-CTA bilat; heart- RRR; abd-soft,+BS,NT; no LE edema  Imaging: US  EKG SITE RITE Result Date: 03/13/2024 If Site Rite image not attached, placement could not be confirmed due to current cardiac rhythm.  MR PELVIS W WO CONTRAST Result Date: 03/12/2024 CLINICAL DATA:  Osteomyelitis or sacral abscess EXAM: MRI PELVIS WITHOUT AND WITH CONTRAST TECHNIQUE: Multiplanar multisequence MR imaging of the pelvis was performed both before and after administration of intravenous contrast. CONTRAST:  8mL GADAVIST  GADOBUTROL  1 MMOL/ML IV SOLN COMPARISON:  CT pelvis 03/11/2024 FINDINGS: OSSEOUS STRUCTURES AND JOINTS: Coccygeal osteomyelitis with erosion of the coccyx noted. Low-grade marrow edema in S2 through S5 segments of the sacrum, probably from early osteomyelitis. No SI joint effusion or hip joint effusion.  MUSCULOTENDINOUS: Presacral abscesses are observed with abnormal enhancement extending into the anterior S2, S3, and S4 foramina. The main portion of the conglomerate abscess measures about 6.7 by 1.1 by 4.4 cm (volume = 17 cm^3) although this abscess may be multiloculated. Small abscesses are also present along the anterior margin of the left gluteus maximus muscle, again potentially loculated but measuring about 6.0 by 1.5 by 2.3 cm as shown on image 30 series 10. Local myositis noted in the bilateral piriformis musculature (especially medially) and in the medial gluteus maximus musculature. Mild edema and enhancement in the lower posterior paraspinal musculature (erector spinae and longissimus thoracis muscles) bilaterally could reflect early myositis. SACRAL PLEXUS: Abscess noted extending along particularly the S3 and S4 components the sacral plexus. Accentuated enhancement of the sciatic nerves, left greater than right, suspicious for sciatic neuropathy/inflammation. OTHER: On image 23 series 11 there appears to be a seton in the posterior perianal space. Scattered perirectal lymph nodes are likely inflammatory. Today's exam was not optimized specifically look at the anal canal or for perianal fistula, the orthopedic pelvis protocol was utilized. IMPRESSION: 1. 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 2. Low-grade marrow edema in S2 through S5 segments of the sacrum, probably from early osteomyelitis. 3. Presacral abscesses with abnormal enhancement extending into the anterior S2, S3, and S4 foramina. The main portion of the conglomerate abscess measures about 6.7 by 1.1 by 4.4 cm (volume = 17 cm^ 3) although this abscess may be multiloculated. Small abscesses are also present along the anterior margin of the left gluteus maximus muscle, again potentially loculated but measuring about 6.0 by 1.5 by 2.3 cm. 4. Local myositis in the bilateral piriformis musculature (especially medially) and in the  medial gluteus maximus musculature. 5. Mild edema and enhancement in the lower posterior paraspinal musculature (erector spinae and longissimus thoracis muscles) bilaterally could reflect early myositis. 6. Accentuated enhancement of the sciatic nerves, left greater than right, suspicious for sciatic neuropathy/inflammation. 7. There appears to be a seton in the posterior perianal space. Scattered perirectal lymph nodes are likely inflammatory. Today's exam was not optimized specifically look at the anal canal or for perianal fistula, the orthopedic pelvis protocol was utilized. Electronically Signed   By: Ryan Salvage M.D.   On: 03/12/2024 09:02   CT ABDOMEN PELVIS W CONTRAST Result Date: 03/11/2024 EXAM: CT ABDOMEN AND PELVIS WITH CONTRAST 03/11/2024  01:33:53 AM TECHNIQUE: CT of the abdomen and pelvis was performed with the administration of intravenous contrast. Multiplanar reformatted images are provided for review. Automated exposure control, iterative reconstruction, and/or weight based adjustment of the mA/kV was utilized to reduce the radiation dose to as low as reasonably achievable. 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 recurrence of osteomyelitis of sacrum/coccyx. FINDINGS: LOWER CHEST: No acute abnormality. LIVER: The liver is unremarkable. GALLBLADDER AND BILE DUCTS: Gallbladder is unremarkable. No biliary ductal dilatation. SPLEEN: No acute abnormality. PANCREAS: No acute abnormality. ADRENAL GLANDS: No acute abnormality. KIDNEYS, URETERS AND BLADDER: Numerous bilateral renal cysts measuring up to 4.2 cm in the anterior left lower kidney, benign (Bosniak 1). No follow up is recommended. No stones in the kidneys or ureters. No hydronephrosis. No perinephric or periureteral stranding. Urinary bladder is unremarkable. GI AND BOWEL: Stomach demonstrates no acute abnormality. There is no bowel obstruction. No bowel wall thickening. Normal appendix. Stable anal  seton in place. PERITONEUM AND RETROPERITONEUM: Stable fluid and soft tissue gas in the presacral region (image 77) and along the medial left gluteal region/ischiorectal fossa (image 91). VASCULATURE: Aorta is normal in caliber. LYMPH NODES: No lymphadenopathy. REPRODUCTIVE ORGANS: No acute abnormality. BONES AND SOFT TISSUES: No cortical destruction to suggest sacral osteomyelitis. IMPRESSION: 1. 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 2. Stable fluid and soft tissue gas in the presacral region and along the medial left gluteal region/ischiorectal fossa. No new abscess. 3. No cortical destruction to suggest sacral osteomyelitis. Electronically signed by: Pinkie Pebbles MD 03/11/2024 01:46 AM EDT RP Workstation: [id number]    Labs:  CBC: Recent Labs    02/14/24 1406 03/11/24 0001 03/12/24 0427 03/13/24 0442  WBC 10.9* 15.9* 13.7* 10.4  HGB 10.9* 8.9* 8.7* 8.6*  HCT 32.7* 28.4* 27.0* 27.1*  PLT 457* 378 359 359    COAGS: No results for input(s): INR, APTT in the last 8760 hours.  BMP: Recent Labs    10/25/23 1206 12/14/23 0941 01/18/24 1056 03/11/24 0001 03/12/24 0427 03/13/24 0442 03/13/24 1521  NA 130*   < > 140 135 136 133*  --   K 3.7   < > 4.3 4.3 4.7 5.3* 4.9  CL 101   < > 112 109 109 104  --   CO2 13*   < > 22 20* 20* 22  --   GLUCOSE 131*   < > 89 89 93 89  --   BUN 21*   < > 18 21* 17 14  --   CALCIUM  8.8*   < > 8.8 8.2* 8.3* 8.2*  --   CREATININE 1.88*   < > 1.25 1.41* 1.43* 1.39*  --   GFRNONAA 46*  --   --  >60 >60 >60  --    < > = values in this interval not displayed.    LIVER FUNCTION TESTS: Recent Labs    10/18/23 1035 10/25/23 1206 01/18/24 1056 03/11/24 0001  BILITOT 0.3 0.5 0.2 0.5  AST 29 19 17  13*  ALT 44 37 27 16  ALKPHOS 91 86 117 80  PROT 7.6 8.1 8.2 7.7  ALBUMIN  2.9* 3.0* 3.7 3.0*    TUMOR MARKERS: No results for input(s): AFPTM, CEA, CA199, CHROMGRNA in the last 8760 hours.  Assessment and Plan:  Patient is with  perirectal/presacral  abscess; scheduled for image-guided perirectal/presacral abscess aspiration 03/14/2024.  Imaging was reviewed and approved by Dr. Vanice.  Risks and benefits of aspiration were discussed with the patient including bleeding,  infection, damage to adjacent structures, and sepsis.  All of the patient's questions were answered, patient is agreeable to proceed. Consent signed and in chart.  Thank you for allowing our service to participate in Shawn [name] 's care.  Electronically Signed: Lavanda JAYSON Jurist, PA-C Gabino Pier Bosher,PA-C 03/13/2024, 4:21 PM    I spent a total of 20 minutes    in face to face in clinical consultation, greater than 50% of which was counseling/coordinating care for image guided left perirectal abscess aspiration.

## 2024-03-13 NOTE — Progress Notes (Signed)
 Peripherally Inserted Central Catheter Placement  The IV Nurse has discussed with the patient and/or persons authorized to consent for the patient, the purpose of this procedure and the potential benefits and risks involved with this procedure.  The benefits include less needle sticks, lab draws from the catheter, and the patient may be discharged home with the catheter. Risks include, but not limited to, infection, bleeding, blood clot (thrombus formation), and puncture of an artery; nerve damage and irregular heartbeat and possibility to perform a PICC exchange if needed/ordered by physician.  Alternatives to this procedure were also discussed.  Bard Power PICC patient education guide, fact sheet on infection prevention and patient information card has been provided to patient /or left at bedside.    PICC Placement Documentation  PICC Single Lumen 03/13/24 Left Cephalic 46 cm 1 cm (Active)  Indication for Insertion or Continuance of Line Home intravenous therapies (PICC only) 03/13/24 1700  Exposed Catheter (cm) 1 cm 03/13/24 1700  Site Assessment Clean, Dry, Intact 03/13/24 1700  Line Status Flushed;Saline locked;Blood return noted 03/13/24 1700  Dressing Type Securing device 03/13/24 1700  Dressing Status Antimicrobial disc/dressing in place;Clean, Dry, Intact 03/13/24 1700  Line Care Connections checked and tightened 03/13/24 1700  Line Adjustment (NICU/IV Team Only) No 03/13/24 1700  Dressing Intervention New dressing;Adhesive placed at insertion site (IV team only) 03/13/24 1700  Dressing Change Due 03/20/24 03/13/24 1700       Ethyl Adonna Fuller 03/13/2024, 5:53 PM

## 2024-03-13 NOTE — Progress Notes (Signed)
 [facility] Progress Note     Subjective: CC:  Perianal pain stable. Having Bms. Reports chills prior to admission that have resolved. States he is on stelara .   Objective: Vital signs in last 24 hours: Temp:  [97.9 F (36.6 C)-98.6 F (37 C)] 97.9 F (36.6 C) (07/22 0541) Pulse Rate:  [63-74] 63 (07/22 0541) Resp:  [16-18] 17 (07/22 0541) BP: (109-128)/(71-81) 111/71 (07/22 0541) SpO2:  [100 %] 100 % (07/22 0541) Last BM Date : 03/11/24  Intake/Output from previous day: 07/21 0701 - 07/22 0700 In: 2689.1 [P.O.:2080; I.V.:609.1] Out: -  Intake/Output this shift: No intake/output data recorded.  PE: Gen:  Alert, NAD, pleasant Card:  Regular rate and rhythm Pulm:  Normal effor Abd: Soft, non-tender GU: : seton in place with some granulation tissue posteriorly, minimal drainage, anterior external openings, there is no fluctuance or cellulitis of the perianal region or intergluteal cleft. Skin: warm and dry, no rashes  Psych: A&Ox3   Lab Results:  Recent Labs    03/12/24 0427 03/13/24 0442  WBC 13.7* 10.4  HGB 8.7* 8.6*  HCT 27.0* 27.1*  PLT 359 359   BMET Recent Labs    03/12/24 0427 03/13/24 0442  NA 136 133*  K 4.7 5.3*  CL 109 104  CO2 20* 22  GLUCOSE 93 89  BUN 17 14  CREATININE 1.43* 1.39*  CALCIUM  8.3* 8.2*   PT/INR No results for input(s): LABPROT, INR in the last 72 hours. CMP     Component Value Date/Time   NA 133 (L) 03/13/2024 0442   NA 139 12/14/2023 0941   K 5.3 (H) 03/13/2024 0442   CL 104 03/13/2024 0442   CO2 22 03/13/2024 0442   GLUCOSE 89 03/13/2024 0442   BUN 14 03/13/2024 0442   BUN 20 12/14/2023 0941   CREATININE 1.39 (H) 03/13/2024 0442   CALCIUM  8.2 (L) 03/13/2024 0442   PROT 7.7 03/11/2024 0001   PROT 6.9 01/10/2023 0920   ALBUMIN  3.0 (L) 03/11/2024 0001   ALBUMIN  3.9 (L) 01/10/2023 0920   AST 13 (L) 03/11/2024 0001   ALT 16 03/11/2024 0001   ALKPHOS 80 03/11/2024 0001   BILITOT 0.5 03/11/2024 0001    BILITOT 0.6 01/10/2023 0920   GFRNONAA >60 03/13/2024 0442   GFRAA >60 01/08/2020 1830   Lipase     Component Value Date/Time   LIPASE 62 (H) 10/25/2023 1206       Studies/Results: MR PELVIS W WO CONTRAST Result Date: 03/12/2024 CLINICAL DATA:  Osteomyelitis or sacral abscess EXAM: MRI PELVIS WITHOUT AND WITH CONTRAST TECHNIQUE: Multiplanar multisequence MR imaging of the pelvis was performed both before and after administration of intravenous contrast. CONTRAST:  8mL GADAVIST  GADOBUTROL  1 MMOL/ML IV SOLN COMPARISON:  CT pelvis 03/11/2024 FINDINGS: OSSEOUS STRUCTURES AND JOINTS: Coccygeal osteomyelitis with erosion of the coccyx noted. Low-grade marrow edema in S2 through S5 segments of the sacrum, probably from early osteomyelitis. No SI joint effusion or hip joint effusion. MUSCULOTENDINOUS: Presacral abscesses are observed with abnormal enhancement extending into the anterior S2, S3, and S4 foramina. The main portion of the conglomerate abscess measures about 6.7 by 1.1 by 4.4 cm (volume = 17 cm^3) although this abscess may be multiloculated. Small abscesses are also present along the anterior margin of the left gluteus maximus muscle, again potentially loculated but measuring about 6.0 by 1.5 by 2.3 cm as shown on image 30 series 10. Local myositis noted in the bilateral piriformis musculature (especially medially) and in the medial  gluteus maximus musculature. Mild edema and enhancement in the lower posterior paraspinal musculature (erector spinae and longissimus thoracis muscles) bilaterally could reflect early myositis. SACRAL PLEXUS: Abscess noted extending along particularly the S3 and S4 components the sacral plexus. Accentuated enhancement of the sciatic nerves, left greater than right, suspicious for sciatic neuropathy/inflammation. OTHER: On image 23 series 11 there appears to be a seton in the posterior perianal space. Scattered perirectal lymph nodes are likely inflammatory. Today's  exam was not optimized specifically look at the anal canal or for perianal fistula, the orthopedic pelvis protocol was utilized. IMPRESSION: 1. 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 2. Low-grade marrow edema in S2 through S5 segments of the sacrum, probably from early osteomyelitis. 3. Presacral abscesses with abnormal enhancement extending into the anterior S2, S3, and S4 foramina. The main portion of the conglomerate abscess measures about 6.7 by 1.1 by 4.4 cm (volume = 17 cm^ 3) although this abscess may be multiloculated. Small abscesses are also present along the anterior margin of the left gluteus maximus muscle, again potentially loculated but measuring about 6.0 by 1.5 by 2.3 cm. 4. Local myositis in the bilateral piriformis musculature (especially medially) and in the medial gluteus maximus musculature. 5. Mild edema and enhancement in the lower posterior paraspinal musculature (erector spinae and longissimus thoracis muscles) bilaterally could reflect early myositis. 6. Accentuated enhancement of the sciatic nerves, left greater than right, suspicious for sciatic neuropathy/inflammation. 7. There appears to be a seton in the posterior perianal space. Scattered perirectal lymph nodes are likely inflammatory. Today's exam was not optimized specifically look at the anal canal or for perianal fistula, the orthopedic pelvis protocol was utilized. Electronically Signed   By: Ryan Salvage M.D.   On: 03/12/2024 09:02    Anti-infectives: Anti-infectives (From admission, onward)    None        Assessment/Plan  38 yo male with perianal Crohn's disease. This is a very interesting picture with fluid collection/tracking up high present for multiple months. This is all quite high and would be difficult to reach from EUA. Discussed with colorectal colleagues who recommend non-operative management with IV abx. Similar collection aspirated in January by IR showed no growth on cultures. His  Stelara  was increased by GI and he just received his second dose bu they are now holding immunosuppresive tx in the setting of possible infection. May be reasonable to ask GI to perform flex sig to confirm that disease is limited to the peri-anal region, and then offer fecal diversion if patient is considered to be failing medical therapy. Per GI note, he is not interested in diversion at this time.     LOS: 2 days   I reviewed nursing notes, hospitalist notes, last 24 h vitals and pain scores, last 48 h intake and output, last 24 h labs and trends, and last 24 h imaging results.  This care required high  level of medical decision making.   Almarie Pringle, PA-C [facility] Please see Amion for pager number during day hours 7:00am-4:30pm

## 2024-03-13 NOTE — Progress Notes (Signed)
 PROGRESS NOTE    Shawn [name]  FMW:[id number] DOB: Nov 18, 1985 DOA: 03/10/2024 PCP: [name], FNP   Brief Narrative:  This 38 yrs old male with medical history significant for Crohn's disease and associated perirectal abscess x 2, sacral osteomyelitis, CKD, dilated cardiomyopathy presented in the ED with rectal pain of unclear etiology.  Patient states that for the last week or so, he has been having increasing sacral and coccygeal as well as perianal pain, He has not had any fever but has had some subjective chills and sweats.  No nausea or vomiting, no abdominal pain per se.  Workup in the emergency department as detailed below is relatively unremarkable for any new findings, Patient was admitted for pain control.  Gastroenterology surgery and infectious diseases has been consulted.   Assessment & Plan:   Principal Problem:   Rectal pain Active Problems:   Abscess of male pelvis (HCC)   Anal fistula   B12 deficiency   Crohn's disease with abscess (HCC)   Perianal pain: Crohn's disease flare: Patient presented with perianal pain, Patient has anal seton in place, as well as stable fluid and soft tissue gas in the presacral region with no evidence of new abscess or inflammatory findings. Continue Pain and nausea control as needed Continue Clear liquid diet GI consulted.  Awaiting pelvic MRI results, blood cultures no growth so far. GI planning to consider flexible sigmoidoscopy to assess Crohn's activity and to rule out CMV.  Patient may need to switch biologic therapy. General surgery consulted, patient does have fistulizing Crohn's disease which is generally never fully resolved.  Discussed with colorectal surgeon who recommended nonoperative management with IV antibiotics.    Crohn's disease: Continue Stelara  monthly   CKD stage III: Renal functions appears to be at baseline.  History of sacral osteomyelitis: Awaiting pelvic MRI report.   Will defer antibiotics  for now.    DVT prophylaxis: Lovenox  Code Status:Full code Family Communication:No family at bed side. Disposition Plan:   Status is: Inpatient Remains inpatient appropriate because: Patient admitted with Crohn's disease flare with fistulizing drainage and sacral osteomyelitis.    Consultants:  Surgery Gastroenterology Infectious diseases  Procedures: MRI Pelvis  Antimicrobials:  Anti-infectives (From admission, onward)    None       Subjective: Patient was seen and examined at bedside.  Overnight events noted. Patient complains about having pain and stated pain is not controlled with oxycodone  and asking for IV pain medications.   Objective: Vitals:   03/12/24 1431 03/12/24 2020 03/13/24 0541 03/13/24 1435  BP: 128/78 109/81 111/71 119/61  Pulse: 72 74 63 67  Resp: 18 16 17 17   Temp: 98.6 F (37 C) 98.1 F (36.7 C) 97.9 F (36.6 C) 99 F (37.2 C)  TempSrc:  Oral Oral Oral  SpO2: 100% 100% 100% 100%  Weight:      Height:        Intake/Output Summary (Last 24 hours) at 03/13/2024 1508 Last data filed at 03/13/2024 1122 Gross per 24 hour  Intake 1924.37 ml  Output --  Net 1924.37 ml   Filed Weights   03/11/24 1200  Weight: 80.9 kg    Examination:  General exam: Appears calm and comfortable, not in any acute distress. Respiratory system: CTA Bilaterally . Respiratory effort normal.  RR 15 Cardiovascular system: S1 & S2 heard, RRR. No JVD, murmurs, rubs, gallops or clicks.  Gastrointestinal system: Abdomen is nondistended, soft and nontender.  Normal bowel sounds heard. Central nervous system: Alert and oriented  x 3. No focal neurological deficits. Extremities: No edema, no cyanosis, no clubbing. Skin: No rashes, lesions or ulcers Psychiatry: Judgement and insight appear normal. Mood & affect appropriate.     Data Reviewed: I have personally reviewed following labs and imaging studies  CBC: Recent Labs  Lab 03/11/24 0001 03/12/24 0427  03/13/24 0442  WBC 15.9* 13.7* 10.4  NEUTROABS 11.2*  --   --   HGB 8.9* 8.7* 8.6*  HCT 28.4* 27.0* 27.1*  MCV 85.8 86.0 87.1  PLT 378 359 359   Basic Metabolic Panel: Recent Labs  Lab 03/11/24 0001 03/12/24 0427 03/13/24 0442  NA 135 136 133*  K 4.3 4.7 5.3*  CL 109 109 104  CO2 20* 20* 22  GLUCOSE 89 93 89  BUN 21* 17 14  CREATININE 1.41* 1.43* 1.39*  CALCIUM  8.2* 8.3* 8.2*  MG  --   --  2.0  PHOS  --   --  3.4   GFR: Estimated Creatinine Clearance: 80.6 mL/min (A) (by C-G formula based on SCr of 1.39 mg/dL (H)). Liver Function Tests: Recent Labs  Lab 03/11/24 0001  AST 13*  ALT 16  ALKPHOS 80  BILITOT 0.5  PROT 7.7  ALBUMIN  3.0*   No results for input(s): LIPASE, AMYLASE in the last 168 hours. No results for input(s): AMMONIA in the last 168 hours. Coagulation Profile: No results for input(s): INR, PROTIME in the last 168 hours. Cardiac Enzymes: No results for input(s): CKTOTAL, CKMB, CKMBINDEX, TROPONINI in the last 168 hours. BNP (last 3 results) No results for input(s): PROBNP in the last 8760 hours. HbA1C: No results for input(s): HGBA1C in the last 72 hours. CBG: No results for input(s): GLUCAP in the last 168 hours. Lipid Profile: No results for input(s): CHOL, HDL, LDLCALC, TRIG, CHOLHDL, LDLDIRECT in the last 72 hours. Thyroid  Function Tests: No results for input(s): TSH, T4TOTAL, FREET4, T3FREE, THYROIDAB in the last 72 hours. Anemia Panel: No results for input(s): VITAMINB12, FOLATE, FERRITIN, TIBC, IRON , RETICCTPCT in the last 72 hours. Sepsis Labs: No results for input(s): PROCALCITON, LATICACIDVEN in the last 168 hours.  Recent Results (from the past 240 hours)  Culture, blood (Routine X 2) w Reflex to ID Panel     Status: None (Preliminary result)   Collection Time: 03/11/24  5:33 PM   Specimen: BLOOD  Result Value Ref Range Status   Specimen Description   Final    BLOOD  SITE NOT SPECIFIED Performed at [facility], [address] [place], [place] [address]    Special Requests   Final    BOTTLES DRAWN AEROBIC AND ANAEROBIC Blood Culture results may not be optimal due to an inadequate volume of blood received in culture bottles Performed at [facility], [address], [place], [place] [address]    Culture   Final    NO GROWTH 2 DAYS Performed at [facility], [address], [place], [place] [address]    Report Status PENDING  Incomplete  Culture, blood (Routine X 2) w Reflex to ID Panel     Status: None (Preliminary result)   Collection Time: 03/11/24  5:38 PM   Specimen: BLOOD  Result Value Ref Range Status   Specimen Description   Final    BLOOD SITE NOT SPECIFIED Performed at [facility], [address], [place], [place] [address]    Special Requests   Final    BOTTLES DRAWN AEROBIC AND ANAEROBIC Blood Culture results may  not be optimal due to an inadequate volume of blood received in culture bottles Performed at [facility], [address] [place], [place] [address]    Culture   Final    NO GROWTH 2 DAYS Performed at [facility], [address] [place], [place] [address]    Report Status PENDING  Incomplete    Radiology Studies: MR PELVIS W WO CONTRAST Result Date: 03/12/2024 CLINICAL DATA:  Osteomyelitis or sacral abscess EXAM: MRI PELVIS WITHOUT AND WITH CONTRAST TECHNIQUE: Multiplanar multisequence MR imaging of the pelvis was performed both before and after administration of intravenous contrast. CONTRAST:  8mL GADAVIST  GADOBUTROL  1 MMOL/ML IV SOLN COMPARISON:  CT pelvis 03/11/2024 FINDINGS: OSSEOUS STRUCTURES AND JOINTS: Coccygeal osteomyelitis with erosion of the coccyx noted. Low-grade marrow edema in S2 through S5 segments of the sacrum, probably from early osteomyelitis. No SI joint effusion or hip joint effusion. MUSCULOTENDINOUS: Presacral abscesses are observed with  abnormal enhancement extending into the anterior S2, S3, and S4 foramina. The main portion of the conglomerate abscess measures about 6.7 by 1.1 by 4.4 cm (volume = 17 cm^3) although this abscess may be multiloculated. Small abscesses are also present along the anterior margin of the left gluteus maximus muscle, again potentially loculated but measuring about 6.0 by 1.5 by 2.3 cm as shown on image 30 series 10. Local myositis noted in the bilateral piriformis musculature (especially medially) and in the medial gluteus maximus musculature. Mild edema and enhancement in the lower posterior paraspinal musculature (erector spinae and longissimus thoracis muscles) bilaterally could reflect early myositis. SACRAL PLEXUS: Abscess noted extending along particularly the S3 and S4 components the sacral plexus. Accentuated enhancement of the sciatic nerves, left greater than right, suspicious for sciatic neuropathy/inflammation. OTHER: On image 23 series 11 there appears to be a seton in the posterior perianal space. Scattered perirectal lymph nodes are likely inflammatory. Today's exam was not optimized specifically look at the anal canal or for perianal fistula, the orthopedic pelvis protocol was utilized. IMPRESSION: 1. 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 2. Low-grade marrow edema in S2 through S5 segments of the sacrum, probably from early osteomyelitis. 3. Presacral abscesses with abnormal enhancement extending into the anterior S2, S3, and S4 foramina. The main portion of the conglomerate abscess measures about 6.7 by 1.1 by 4.4 cm (volume = 17 cm^ 3) although this abscess may be multiloculated. Small abscesses are also present along the anterior margin of the left gluteus maximus muscle, again potentially loculated but measuring about 6.0 by 1.5 by 2.3 cm. 4. Local myositis in the bilateral piriformis musculature (especially medially) and in the medial gluteus maximus musculature. 5. Mild edema and  enhancement in the lower posterior paraspinal musculature (erector spinae and longissimus thoracis muscles) bilaterally could reflect early myositis. 6. Accentuated enhancement of the sciatic nerves, left greater than right, suspicious for sciatic neuropathy/inflammation. 7. There appears to be a seton in the posterior perianal space. Scattered perirectal lymph nodes are likely inflammatory. Today's exam was not optimized specifically look at the anal canal or for perianal fistula, the orthopedic pelvis protocol was utilized. Electronically Signed   By: Ryan Salvage M.D.   On: 03/12/2024 09:02   Scheduled Meds:  enoxaparin  (LOVENOX ) injection  40 mg Subcutaneous Q24H   polyethylene glycol  17 g Oral Daily   Continuous Infusions:     LOS: 2 days    Time spent: 35 Mins    Darcel Dawley, MD [facility]   If 7PM-7AM,  please contact night-coverage

## 2024-03-13 NOTE — Progress Notes (Signed)
 [facility] ROUNDING NOTE   Subjective: No new complaints today.  Pain somewhat better.  No fevers or chills.  White count improving.  Patient would like to advance diet.   Objective: Vital signs in last 24 hours: Temp:  [97.9 F (36.6 C)-99 F (37.2 C)] 99 F (37.2 C) (07/22 1435) Pulse Rate:  [63-74] 67 (07/22 1435) Resp:  [16-17] 17 (07/22 1435) BP: (109-119)/(61-81) 119/61 (07/22 1435) SpO2:  [100 %] 100 % (07/22 1435) Last BM Date : 03/08/24 General: NAD, pleasant African-American male Rectal exam not repeated today     Intake/Output from previous day: 07/21 0701 - 07/22 0700 In: 2689.1 [P.O.:2080; I.V.:609.1] Out: -  Intake/Output this shift: Total I/O In: 360 [P.O.:360] Out: -    Lab Results: Recent Labs    03/11/24 0001 03/12/24 0427 03/13/24 0442  WBC 15.9* 13.7* 10.4  HGB 8.9* 8.7* 8.6*  PLT 378 359 359  MCV 85.8 86.0 87.1   BMET Recent Labs    03/11/24 0001 03/12/24 0427 03/13/24 0442 03/13/24 1521  NA 135 136 133*  --   K 4.3 4.7 5.3* 4.9  CL 109 109 104  --   CO2 20* 20* 22  --   GLUCOSE 89 93 89  --   BUN 21* 17 14  --   CREATININE 1.41* 1.43* 1.39*  --   CALCIUM  8.2* 8.3* 8.2*  --    LFT Recent Labs    03/11/24 0001  PROT 7.7  ALBUMIN  3.0*  AST 13*  ALT 16  ALKPHOS 80  BILITOT 0.5   PT/INR No results for input(s): INR in the last 72 hours.    Imaging/Other results: US  EKG SITE RITE Result Date: 03/13/2024 If [facility] image not attached, placement could not be confirmed due to current cardiac rhythm.  MR PELVIS W WO CONTRAST Result Date: 03/12/2024 CLINICAL DATA:  Osteomyelitis or sacral abscess EXAM: MRI PELVIS WITHOUT AND WITH CONTRAST TECHNIQUE: Multiplanar multisequence MR imaging of the pelvis was performed both before and after administration of intravenous contrast. CONTRAST:  8mL GADAVIST  GADOBUTROL  1 MMOL/ML IV SOLN COMPARISON:  CT pelvis 03/11/2024 FINDINGS: OSSEOUS STRUCTURES AND JOINTS:  Coccygeal osteomyelitis with erosion of the coccyx noted. Low-grade marrow edema in S2 through S5 segments of the sacrum, probably from early osteomyelitis. No SI joint effusion or hip joint effusion. MUSCULOTENDINOUS: Presacral abscesses are observed with abnormal enhancement extending into the anterior S2, S3, and S4 foramina. The main portion of the conglomerate abscess measures about 6.7 by 1.1 by 4.4 cm (volume = 17 cm^3) although this abscess may be multiloculated. Small abscesses are also present along the anterior margin of the left gluteus maximus muscle, again potentially loculated but measuring about 6.0 by 1.5 by 2.3 cm as shown on image 30 series 10. Local myositis noted in the bilateral piriformis musculature (especially medially) and in the medial gluteus maximus musculature. Mild edema and enhancement in the lower posterior paraspinal musculature (erector spinae and longissimus thoracis muscles) bilaterally could reflect early myositis. SACRAL PLEXUS: Abscess noted extending along particularly the S3 and S4 components the sacral plexus. Accentuated enhancement of the sciatic nerves, left greater than right, suspicious for sciatic neuropathy/inflammation. OTHER: On image 23 series 11 there appears to be a seton in the posterior perianal space. Scattered perirectal lymph nodes are likely inflammatory. Today's exam was not optimized specifically look at the anal canal or for perianal fistula, the orthopedic pelvis protocol was utilized. IMPRESSION: 1. 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 2. Low-grade  marrow edema in S2 through S5 segments of the sacrum, probably from early osteomyelitis. 3. Presacral abscesses with abnormal enhancement extending into the anterior S2, S3, and S4 foramina. The main portion of the conglomerate abscess measures about 6.7 by 1.1 by 4.4 cm (volume = 17 cm^ 3) although this abscess may be multiloculated. Small abscesses are also present along the anterior margin  of the left gluteus maximus muscle, again potentially loculated but measuring about 6.0 by 1.5 by 2.3 cm. 4. Local myositis in the bilateral piriformis musculature (especially medially) and in the medial gluteus maximus musculature. 5. Mild edema and enhancement in the lower posterior paraspinal musculature (erector spinae and longissimus thoracis muscles) bilaterally could reflect early myositis. 6. Accentuated enhancement of the sciatic nerves, left greater than right, suspicious for sciatic neuropathy/inflammation. 7. There appears to be a seton in the posterior perianal space. Scattered perirectal lymph nodes are likely inflammatory. Today's exam was not optimized specifically look at the anal canal or for perianal fistula, the orthopedic pelvis protocol was utilized. Electronically Signed   By: Ryan Salvage M.D.   On: 03/12/2024 09:02      Assessment and Plan:  38 year old male with complicated Crohn's proctitis with severe, complicated perianal disease with history of perianal fistulas perirectal abscess, coccygeal osteomyelitis status post coccygectomy readmitted for worsening sacral pain, found to have new presacral abscesses and ongoing coccygeal osteomyelitis. Case discussed with Dr. Overton of infectious disease.  It is very likely that the patient's Crohn's disease is contributing to these recurrent abscesses and osteomyelitis.  It is unlikely that there is another underlying process at play.  Unfortunately, medical therapy is limited in efficacy for perianal Crohn's.  Infliximab  with the drug of choice, but he has already failed this.  He is currently on high-dose Stelara , but his disease has progressed, although it is not clear that enough time has elapsed to declare drug failure on Stelara  at this point.  Given the severity and chronicity of his symptoms and persistent pelvic inflammation, I do have significant reservations about the long-term effectiveness of medical therapy for this  patient.  I do think that a diverting colostomy would be the most effective way to heal his perianal disease.  I again discussed that with him and urged him to keep an open mind about it.  We discussed how long-term antibiotics is likely to lead to resistant organisms, and may pose risk to him in the future.  We also discussed the relative lack of effective medical therapy for perianal Crohn's disease after failure of infliximab .  For now, agree with antibiotic therapy alone.  Agree that tailored antibiotics based on abscess sampling would be ideal, if IR feels they are amenable to aspiration.  Dr. Overton feels that is reasonable to continue Stelara  as long as the infection is controlled/addressed.  His next injection will be Aug 13; will defer this to his primary GI, Dr. Suzann when that time comes.  I do not think there is any utility of performing a flexible sigmoidoscopy as this would not affect his medical management.  However, if surgery would like his rectum/sigmoid assessed prior to consideration of any surgery, we can certainly perform a endoscopic exam if needed.  Perianal Crohn's disease with presacral abscesses coccygeal osteomyelitis - Agree with IR consultation for possible abscess aspiration - Defer antibiotic choice/duration to infectious disease - Would recommend fecal diversion if surgery agrees and patient consents - Pain management per hospitalist - Okay to advance diet from GI standpoint - For  now, we will plan to continue Stelara  at outpatient dose (next dose August 13) - Can perform endoscopic exam if needed for surgical planning, otherwise no plans for endoscopy.    Glendia FORBES Holt, MD  03/13/2024, 4:09 PM [facility]

## 2024-03-13 NOTE — Progress Notes (Signed)
 PHARMACY CONSULT NOTE FOR:  OUTPATIENT  PARENTERAL ANTIBIOTIC THERAPY (OPAT)  Indication: Peri-rectal abscess/osteo Regimen: Zosyn  13.5g daily as a continuous infusion End date: 04/26/24 (6 weeks from start on 03/15/24)  IV antibiotic discharge orders are pended. To discharging provider:  please sign these orders via discharge navigator,  Select New Orders & click on the button choice - Manage This Unsigned Work.    Thank you for allowing pharmacy to be a part of this patient's care.  Almarie Lunger, PharmD, BCPS, BCIDP Infectious Diseases Clinical Pharmacist 03/15/2024 12:47 PM   **Pharmacist phone directory can now be found on [email or web address] ([email or web address]).  Listed under [facility].

## 2024-03-14 ENCOUNTER — Encounter (HOSPITAL_COMMUNITY): Payer: Self-pay | Admitting: Internal Medicine

## 2024-03-14 ENCOUNTER — Inpatient Hospital Stay (HOSPITAL_COMMUNITY)

## 2024-03-14 DIAGNOSIS — R52 Pain, unspecified: Secondary | ICD-10-CM | POA: Diagnosis not present

## 2024-03-14 DIAGNOSIS — K50914 Crohn's disease, unspecified, with abscess: Secondary | ICD-10-CM | POA: Diagnosis not present

## 2024-03-14 DIAGNOSIS — K6289 Other specified diseases of anus and rectum: Secondary | ICD-10-CM | POA: Diagnosis not present

## 2024-03-14 LAB — CBC
HCT: 27.5 % — ABNORMAL LOW (ref 39.0–52.0)
Hemoglobin: 8.8 g/dL — ABNORMAL LOW (ref 13.0–17.0)
MCH: 27.4 pg (ref 26.0–34.0)
MCHC: 32 g/dL (ref 30.0–36.0)
MCV: 85.7 fL (ref 80.0–100.0)
Platelets: 401 K/uL — ABNORMAL HIGH (ref 150–400)
RBC: 3.21 MIL/uL — ABNORMAL LOW (ref 4.22–5.81)
RDW: 21.7 % — ABNORMAL HIGH (ref 11.5–15.5)
WBC: 9.9 K/uL (ref 4.0–10.5)
nRBC: 0 % (ref 0.0–0.2)

## 2024-03-14 LAB — BASIC METABOLIC PANEL WITH GFR
Anion gap: 9 (ref 5–15)
BUN: 12 mg/dL (ref 6–20)
CO2: 22 mmol/L (ref 22–32)
Calcium: 8.5 mg/dL — ABNORMAL LOW (ref 8.9–10.3)
Chloride: 106 mmol/L (ref 98–111)
Creatinine, Ser: 1.36 mg/dL — ABNORMAL HIGH (ref 0.61–1.24)
GFR, Estimated: >60 mL/min (ref >60)
Glucose, Bld: 100 mg/dL — ABNORMAL HIGH (ref 70–99)
Potassium: 5.1 mmol/L (ref 3.5–5.1)
Sodium: 137 mmol/L (ref 135–145)

## 2024-03-14 LAB — MAGNESIUM: Magnesium: 2 mg/dL (ref 1.7–2.4)

## 2024-03-14 LAB — PROTIME-INR
INR: 1.1 (ref 0.8–1.2)
Prothrombin Time: 15.1 s (ref 11.4–15.2)

## 2024-03-14 LAB — PHOSPHORUS: Phosphorus: 3.2 mg/dL (ref 2.5–4.6)

## 2024-03-14 MED ORDER — FENTANYL CITRATE (PF) 100 MCG/2ML IJ SOLN
INTRAMUSCULAR | Status: AC | PRN
  Administered 2024-03-14: 25 ug via INTRAVENOUS
  Administered 2024-03-14 (×2): 50 ug via INTRAVENOUS
  Administered 2024-03-14: 25 ug via INTRAVENOUS

## 2024-03-14 MED ORDER — OXYCODONE HCL 5 MG PO TABS
5.0000 mg | ORAL_TABLET | ORAL | Status: DC | PRN
  Administered 2024-03-14 – 2024-03-16 (×9): 10 mg via ORAL
  Filled 2024-03-14 (×9): qty 2

## 2024-03-14 MED ORDER — FENTANYL CITRATE (PF) 100 MCG/2ML IJ SOLN
INTRAMUSCULAR | Status: AC
Start: 2024-03-14 — End: 2024-03-14
  Filled 2024-03-14: qty 4

## 2024-03-14 MED ORDER — MIDAZOLAM HCL 2 MG/2ML IJ SOLN
INTRAMUSCULAR | Status: AC | PRN
Start: 2024-03-14 — End: 2024-03-14
  Administered 2024-03-14: .5 mg via INTRAVENOUS
  Administered 2024-03-14: 1 mg via INTRAVENOUS

## 2024-03-14 MED ORDER — ACETAMINOPHEN 500 MG PO TABS
1000.0000 mg | ORAL_TABLET | Freq: Three times a day (TID) | ORAL | Status: DC
  Administered 2024-03-14 – 2024-03-16 (×7): 1000 mg via ORAL
  Filled 2024-03-14 (×7): qty 2

## 2024-03-14 MED ORDER — MIDAZOLAM HCL 2 MG/2ML IJ SOLN
INTRAMUSCULAR | Status: AC
  Filled 2024-03-14: qty 6

## 2024-03-14 MED ORDER — HYDROMORPHONE HCL 1 MG/ML IJ SOLN
1.0000 mg | INTRAMUSCULAR | Status: DC | PRN
  Administered 2024-03-14 – 2024-03-15 (×5): 1 mg via INTRAVENOUS
  Filled 2024-03-14 (×5): qty 1

## 2024-03-14 MED ORDER — LORAZEPAM 2 MG/ML IJ SOLN
INTRAMUSCULAR | Status: AC | PRN
  Administered 2024-03-14: .5 mg via INTRAVENOUS

## 2024-03-14 NOTE — Procedures (Signed)
 Interventional Radiology Procedure Note  Procedure: CT ASP OF THE SMALL MEDIAL GLUTEAL AIR COLLECTION    Complications: None  Estimated Blood Loss:  MIN  Findings: LEFT ASP YIELDED ONLY SCANT BLOOD TINGED FLD RT ASP WAS A DRY TAP  NOTHING BUT AIR BY CT.  NO SIG FLUID POCKET TO TARGET.     EMERSON FREDERIC SPECKING, MD

## 2024-03-14 NOTE — Progress Notes (Signed)
 Brief note: No new recommendations from GI.  Aspiration of abscess pocket unsuccessful.  Defer antibiotic recommendations to ID. Will tentatively plan on continuing Stelara  at current dose/frequenc (next dose Aug 13).  Will arrange outpatient follow up with Dr. Suzann Encourage patient to reconsider fecal diversion given low likelihood of complete healing of pelvic inflammation with medical therapy.

## 2024-03-14 NOTE — Progress Notes (Addendum)
 PROGRESS NOTE   Shawn [name]  FMW:[id number]    DOB: 1986/08/04    DOA: 03/10/2024  PCP: [name], FNP   I have briefly reviewed patients previous medical records in [facility].   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  Found to have new presacral abscesses and ongoing coccygeal osteomyelitis.  [facility], ID and IR consulted.  IR plan to aspirate abscesses on 7/23.   Assessment & Plan:   Complicated Crohn's disease with presacral abscesses and ongoing coccygeal osteomyelitis [facility], ID and IR input appreciated. Plan for IR to aspirate abscesses on 7/23. Based on culture data, ID plan to initiate antibiotics.  If no sampling available or possible, they indicate that we can try empiric pip-tazo for 6 weeks.  Please refer to ID note from 7/22 for details. Referred to [facility] input for details from 7/22: They recommend fecal diversion but it appears that patient is not ready for this yet.  They are okay to advance diet, ordered.  Continue Stelara  as outpatient with next dose on August 13. Multimodality pain management. Blood cultures, negative to date.  Stage II CKD Not stage III as per earlier notes.  Creatinine stable in the 1.3 range.  Periodically monitor BMP  Anemia of chronic disease Hemoglobin stable in the mid 8 g range.   Body mass index is 23.53 kg/m.   DVT prophylaxis: enoxaparin  (LOVENOX ) injection 40 mg Start: 03/11/24 1000     Code Status: Full Code:  Family Communication: None at bedside Disposition:  Status is: Inpatient Remains inpatient appropriate because: Awaiting abscess aspiration, further evaluation and management by multiple  specialist.     Consultants:   [facility] Infectious disease Interventional radiology  Procedures:     Subjective:  Patient reports that her sacral/perianal area pain is severe, not controlled by current pain regimen.  Pain only comes down to 7/10 after p.o. pain meds.  Asking for diet to be advanced.  Objective:   Vitals:   03/13/24 0541 03/13/24 1435 03/13/24 2128 03/14/24 0617  BP: 111/71 119/61 127/73 111/74  Pulse: 63 67 (!) 56 (!) 53  Resp: 17 17 18 17   Temp: 97.9 F (36.6 C) 99 F (37.2 C) 98.9 F (37.2 C) 97.9 F (36.6 C)  TempSrc: Oral Oral Oral   SpO2: 100% 100% 100% 100%  Weight:      Height:        General exam: Young male, moderately built and nourished lying comfortably supine in bed without distress.  Oral mucosa moist. Respiratory system: Clear to auscultation. Respiratory effort normal. Cardiovascular system: S1 & S2 heard, RRR. No JVD, murmurs, rubs, gallops or clicks. No pedal edema. Gastrointestinal system: Abdomen is nondistended, soft and nontender. No organomegaly or masses felt. Normal bowel sounds heard. Central nervous system: Alert and oriented. No focal neurological deficits. Extremities: Symmetric 5 x 5 power. Skin: No rashes, lesions or ulcers Psychiatry: Judgement and insight appear normal. Mood & affect appropriate.     Data Reviewed:   I have personally reviewed following labs and imaging studies   CBC: Recent Labs  Lab 03/11/24 0001 03/12/24 0427 03/13/24 0442 03/14/24 0352  WBC 15.9* 13.7* 10.4 9.9  NEUTROABS 11.2*  --   --   --   HGB 8.9* 8.7* 8.6* 8.8*  HCT 28.4* 27.0* 27.1* 27.5*  MCV 85.8 86.0 87.1 85.7  PLT 378 359 359 401*    Basic Metabolic Panel: Recent Labs  Lab 03/11/24 0001 03/12/24 0427 03/13/24 0442 03/13/24 1521 03/14/24 0352  NA 135 136 133*  --  137  K 4.3 4.7 5.3* 4.9 5.1  CL 109 109 104  --  106  CO2 20* 20* 22  --  22  GLUCOSE 89 93 89  --  100*  BUN 21* 17 14  --  12  CREATININE 1.41*  1.43* 1.39*  --  1.36*  CALCIUM  8.2* 8.3* 8.2*  --  8.5*  MG  --   --  2.0  --  2.0  PHOS  --   --  3.4  --  3.2    Liver Function Tests: Recent Labs  Lab 03/11/24 0001  AST 13*  ALT 16  ALKPHOS 80  BILITOT 0.5  PROT 7.7  ALBUMIN  3.0*    CBG: No results for input(s): GLUCAP in the last 168 hours.  Microbiology Studies:   Recent Results (from the past 240 hours)  Culture, blood (Routine X 2) w Reflex to ID Panel     Status: None (Preliminary result)   Collection Time: 03/11/24  5:33 PM   Specimen: BLOOD  Result Value Ref Range Status   Specimen Description   Final    BLOOD SITE NOT SPECIFIED Performed at [facility], [address] [place], [place] [address]    Special Requests   Final    BOTTLES DRAWN AEROBIC AND ANAEROBIC Blood Culture results may not be optimal due to an inadequate volume of blood received in culture bottles Performed at [facility], [address], [place], [place] [address]    Culture   Final    NO GROWTH 3 DAYS Performed at [facility], [address], [place], [place] [address]    Report Status PENDING  Incomplete  Culture, blood (Routine X 2) w Reflex to ID Panel     Status: None (Preliminary result)   Collection Time: 03/11/24  5:38 PM   Specimen: BLOOD  Result Value Ref Range Status   Specimen Description   Final    BLOOD SITE NOT SPECIFIED Performed at [facility], [address], [place], [place] [address]    Special Requests   Final    BOTTLES DRAWN AEROBIC AND ANAEROBIC Blood Culture results may not be optimal due to an inadequate volume of blood received in culture bottles Performed at [facility], [address], [place], [place] [address]    Culture   Final    NO GROWTH 3 DAYS Performed at [facility], [address], [place], [place] [address]    Report Status PENDING  Incomplete    Radiology Studies:  US  EKG SITE RITE Result Date: 03/13/2024 If Site  Rite image not attached, placement could not be confirmed due to current cardiac rhythm.   Scheduled Meds:    Chlorhexidine  Gluconate Cloth  6 each Topical Daily   enoxaparin  (LOVENOX ) injection  40 mg Subcutaneous Q24H   polyethylene glycol  17 g Oral Daily   sodium chloride  flush  10-40 mL Intracatheter Q12H    Continuous Infusions:     LOS: 3 days     Trenda Mar, MD,  FACP, [facility], [facility], [facility]   [facility] [place]      To contact the attending provider between  7A-7P or the covering provider during after hours 7P-7A, please log into the web site [email or web address] and access using universal [place] password for that web site. If you do not have the password, please call the hospital operator.  03/14/2024, 10:05 AM

## 2024-03-14 NOTE — Plan of Care (Signed)

## 2024-03-15 ENCOUNTER — Telehealth: Payer: Self-pay

## 2024-03-15 DIAGNOSIS — D649 Anemia, unspecified: Secondary | ICD-10-CM

## 2024-03-15 DIAGNOSIS — K50913 Crohn's disease, unspecified, with fistula: Secondary | ICD-10-CM | POA: Diagnosis not present

## 2024-03-15 DIAGNOSIS — K5901 Slow transit constipation: Secondary | ICD-10-CM | POA: Diagnosis not present

## 2024-03-15 DIAGNOSIS — K610 Anal abscess: Secondary | ICD-10-CM | POA: Diagnosis not present

## 2024-03-15 DIAGNOSIS — M8660 Other chronic osteomyelitis, unspecified site: Secondary | ICD-10-CM

## 2024-03-15 DIAGNOSIS — M4628 Osteomyelitis of vertebra, sacral and sacrococcygeal region: Secondary | ICD-10-CM | POA: Diagnosis not present

## 2024-03-15 DIAGNOSIS — K50113 Crohn's disease of large intestine with fistula: Secondary | ICD-10-CM | POA: Diagnosis not present

## 2024-03-15 DIAGNOSIS — K651 Peritoneal abscess: Secondary | ICD-10-CM | POA: Diagnosis not present

## 2024-03-15 DIAGNOSIS — K6289 Other specified diseases of anus and rectum: Secondary | ICD-10-CM | POA: Diagnosis not present

## 2024-03-15 DIAGNOSIS — Z79620 Long term (current) use of immunosuppressive biologic: Secondary | ICD-10-CM | POA: Diagnosis not present

## 2024-03-15 LAB — BASIC METABOLIC PANEL WITH GFR
Anion gap: 7 (ref 5–15)
Anion gap: 7 (ref 5–15)
BUN: 16 mg/dL (ref 6–20)
BUN: 19 mg/dL (ref 6–20)
CO2: 23 mmol/L (ref 22–32)
CO2: 22 mmol/L (ref 22–32)
Calcium: 8.4 mg/dL — ABNORMAL LOW (ref 8.9–10.3)
Calcium: 8.6 mg/dL — ABNORMAL LOW (ref 8.9–10.3)
Chloride: 107 mmol/L (ref 98–111)
Chloride: 111 mmol/L (ref 98–111)
Creatinine, Ser: 1.42 mg/dL — ABNORMAL HIGH (ref 0.61–1.24)
Creatinine, Ser: 1.32 mg/dL — ABNORMAL HIGH (ref 0.61–1.24)
GFR, Estimated: >60 mL/min (ref >60)
GFR, Estimated: >60 mL/min (ref >60)
Glucose, Bld: 110 mg/dL — ABNORMAL HIGH (ref 70–99)
Glucose, Bld: 124 mg/dL — ABNORMAL HIGH (ref 70–99)
Potassium: 5.3 mmol/L — ABNORMAL HIGH (ref 3.5–5.1)
Potassium: 4.9 mmol/L (ref 3.5–5.1)
Sodium: 137 mmol/L (ref 135–145)
Sodium: 140 mmol/L (ref 135–145)

## 2024-03-15 LAB — CBC
HCT: 28.5 % — ABNORMAL LOW (ref 39.0–52.0)
Hemoglobin: 8.9 g/dL — ABNORMAL LOW (ref 13.0–17.0)
MCH: 27 pg (ref 26.0–34.0)
MCHC: 31.2 g/dL (ref 30.0–36.0)
MCV: 86.4 fL (ref 80.0–100.0)
Platelets: 405 K/uL — ABNORMAL HIGH (ref 150–400)
RBC: 3.3 MIL/uL — ABNORMAL LOW (ref 4.22–5.81)
RDW: 22 % — ABNORMAL HIGH (ref 11.5–15.5)
WBC: 8.7 K/uL (ref 4.0–10.5)
nRBC: 0 % (ref 0.0–0.2)

## 2024-03-15 LAB — C-REACTIVE PROTEIN: CRP: 8.7 mg/dL — ABNORMAL HIGH (ref <1.0)

## 2024-03-15 LAB — SEDIMENTATION RATE: Sed Rate: 130 mm/h — ABNORMAL HIGH (ref 0–16)

## 2024-03-15 MED ORDER — POLYETHYLENE GLYCOL 3350 17 G PO PACK
34.0000 g | PACK | Freq: Two times a day (BID) | ORAL | Status: DC
  Administered 2024-03-15 – 2024-03-16 (×2): 34 g via ORAL
  Filled 2024-03-15 (×2): qty 2

## 2024-03-15 MED ORDER — ACETAMINOPHEN 500 MG PO TABS
1000.0000 mg | ORAL_TABLET | Freq: Three times a day (TID) | ORAL | Status: AC | PRN
Start: 2024-03-15 — End: ?

## 2024-03-15 MED ORDER — SODIUM ZIRCONIUM CYCLOSILICATE 10 G PO PACK
10.0000 g | PACK | Freq: Two times a day (BID) | ORAL | Status: AC
  Administered 2024-03-15 (×2): 10 g via ORAL
  Filled 2024-03-15 (×2): qty 1

## 2024-03-15 MED ORDER — PIPERACILLIN-TAZOBACTAM 3.375 G IVPB
3.3750 g | Freq: Three times a day (TID) | INTRAVENOUS | Status: DC
  Administered 2024-03-15 – 2024-03-16 (×4): 3.375 g via INTRAVENOUS
  Filled 2024-03-15 (×4): qty 50

## 2024-03-15 MED ORDER — POLYETHYLENE GLYCOL 3350 17 G PO PACK
34.0000 g | PACK | Freq: Every day | ORAL | Status: DC
  Administered 2024-03-15: 34 g via ORAL
  Filled 2024-03-15: qty 2

## 2024-03-15 MED ORDER — OXYCODONE HCL 5 MG PO TABS: 5.0000 mg | ORAL_TABLET | Freq: Four times a day (QID) | ORAL | 0 refills | Status: DC | PRN

## 2024-03-15 MED ORDER — DIPHENHYDRAMINE HCL 12.5 MG/5ML PO ELIX
25.0000 mg | ORAL_SOLUTION | Freq: Four times a day (QID) | ORAL | Status: DC | PRN
  Administered 2024-03-15 – 2024-03-16 (×2): 25 mg via ORAL
  Filled 2024-03-15 (×2): qty 10

## 2024-03-15 MED ORDER — POLYETHYLENE GLYCOL 3350 17 G PO PACK: 17.0000 g | PACK | Freq: Two times a day (BID) | ORAL | 1 refills | Status: DC

## 2024-03-15 MED ORDER — PIPERACILLIN-TAZOBACTAM IV (FOR PTA / DISCHARGE USE ONLY): 13.5000 g | INTRAVENOUS | 0 refills | Status: AC

## 2024-03-15 NOTE — TOC Progression Note (Addendum)
 Transition of Care [facility]) - Progression Note    Patient Details  Name: Shawn [name] MRN: [id number] Date of Birth: 1986/02/27  [facility] CM/SW Contact  Sonda Manuella Quill, RN Phone Number: 03/15/2024, 4:35 PM  Clinical Narrative:    OPAT orders received; Holley Herring at [facility] notified and says pt has been educated; spoke w/ pt in room; pt says he would like to also use [facility] for [facility]; Holley Herring notified, and said agency can provide service; agency contact info placed in follow up provider section of d/c instructions; TOC is following.   Expected Discharge Plan: Home w Home Health Services                 Expected Discharge Plan and Services       Living arrangements for the past 2 months: Single Family Home                           HH Arranged: RN, IV Antibiotics HH Agency: [facility] Date HH Agency Contacted: 03/15/24 Time HH Agency Contacted: 1624 Representative spoke with at [facility] Agency: Holley Herring   Social Drivers of Health (SDOH) Interventions SDOH Screenings   Food Insecurity: Food Insecurity Present (03/11/2024)  Housing: High Risk (03/11/2024)  Transportation Needs: No Transportation Needs (03/11/2024)  Utilities: At Risk (03/11/2024)  Alcohol Screen: Low Risk  (09/18/2023)  Depression (PHQ2-9): Low Risk  (02/14/2024)  Recent Concern: Depression (PHQ2-9) - High Risk (12/14/2023)  Financial Resource Strain: Low Risk  (09/18/2023)  Physical Activity: Sufficiently Active (09/18/2023)  Social Connections: Socially Isolated (03/11/2024)  Stress: Stress Concern Present (09/18/2023)  Tobacco Use: Medium Risk (03/10/2024)    Readmission Risk Interventions    03/12/2024    9:25 AM  Readmission Risk Prevention Plan  Post Dischage Appt Complete  Medication Screening Complete  Transportation Screening Complete

## 2024-03-15 NOTE — Progress Notes (Addendum)
 03/15/2024  Shawn [name] [id number] [birth year]  CARE TEAM: PCP: [name], FNP  Outpatient Care Team: Patient Care Team: [name], FNP as PCP - General (Family Medicine) [name], MD as Consulting Physician (Colon and Rectal Surgery) Comer, Lamar ORN, MD as Consulting Physician (Infectious Diseases) McGreal, Inocente HERO, MD as Consulting Physician (Gastroenterology)  Inpatient Treatment Team: Treatment Team:  Judeth Trenda BIRCH, MD McGreal, Inocente HERO, MD [facility], Md, MD Bobbette Cartwright, MD [name], MD Russella Rosella, RN Carolee Rosaline DASEN, [facility] Debby Graylin DASEN, RN   Problem List:   Principal Problem:   Rectal pain Active Problems:   Abscess of male pelvis [facility])   Anal fistula   B12 deficiency   Crohn's disease with abscess (HCC)   Osteomyelitis (HCC)   * No surgery found *      Assessment [facility] Stay = 4 days)      Chronic proctitis presumed to be Crohn's disease with recurrent perirectal / gluteal abscesses and anorectal involvement s/p coccygectomy and seton placements 2022  Replacement of seton 2023   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    Concern for persistent Crohn's proctitis status post restarting immunosuppression with Stelara  April 2025      Plan:  Chronic persistent presacral collection  -not of large volume  -w/o change since Jan 2025.   -Negative cultures in January 2025 on prior aspiration although on antibiotics.  -Repeat aspiration 03/14/2024.  I removed most of his coccyx on the surgery 3 years ago.  I do not know if he truly has osteomyelitis I do not know if chronic antibiotics are appropriate.  May be reasonable to try 6-week course if specific organisms ID'd and can be targeted.  I have to defer to ID on that.  If d/t Crohn's, my instinct is that he has not been on his Stelara  enough to control this.  I do not think he has  worsening infection.  See if that can calm things down.  Interval shortened w extra dose already.  ?Try different regimen?  Add adjuvant/complkiemtary medication/Tx?  Defer to GI   Surgery does not have much to offer aside from fecal diversion.    I would want a colonoscopy to evaluate with biopsies.  I need to know what part of the digestive tract could be brought up and diverted.  Left sigmoid/descending colon?  Terminal ileum? (usually a chronic Crohn's area so not ideal either).    August 2024 colonoscopy notes small bowel and colon is not inflamed, just the rectum.    Do not know if diagnosis of Crohn's proctitis needs to be reevaluated or confirmed or if Gastroenterology feels that workup and biopsies are pathognomonic for it.  Defer to proper diagnosing and treatment to gastroenterology.  Patient again is not interested in a diverting ostomy.    His main issue is pain.  He does not seem to have any hard indication for fecal diversion at this time (uncontrolled diarrhea, bleeding, worsening abscesses, nor fecal incontinence).  He seems to be in his purgatory of chronic presacral fluid collection with chronic pain.  Consider palliative care eval needs to be involved to help manage his chronic pain.  He had been dependent on opiates for low back pain issues.  Claims no BM in a week.  Odd that he has more constipation symptoms than anything else - but this is not new.  No definite evidence of stricturing or colon obstruction on  workup in the past year.  Recommend increasing his MiraLAX  to higher dose until he is moving his bowels at least every other day.         I reviewed Consultant ID notes, hospitalist notes, last 24 h vitals and pain scores, last 48 h intake and output, last 24 h labs and trends, and last 24 h imaging results.  I have reviewed this patient's available data, including medical history, events of note, test results, etc as part of my evaluation.   A significant portion  of that time was spent in counseling. Care during the described time interval was provided by me.  This care required high  level of medical decision making.  03/15/2024    Subjective: (Chief complaint)  Deep IR drainage done yesterday.  Patient lying in bed.  Still with pain.  Passing gas but claims no BM for a week.  Tolerating solid diet.  Objective:  Vital signs:  Vitals:   03/14/24 1825 03/14/24 2114 03/15/24 0144 03/15/24 0601  BP: (!) 108/56 120/80 92/69 122/86  Pulse: (!) 56 72 64 69  Resp: 15 18 17 18   Temp: 98.8 F (37.1 C) 98.4 F (36.9 C) 97.9 F (36.6 C) 98.6 F (37 C)  TempSrc: Oral Oral Oral   SpO2: 97% 100% 98% 100%  Weight:      Height:        Last BM Date : 03/08/24  Intake/Output   Yesterday:  07/23 0701 - 07/24 0700 In: 600 [P.O.:600] Out: -  This shift:  No intake/output data recorded.  Bowel function:  Flatus: YES  BM:  No  Drain: Posterior midline Blue seton in place with scant coagulum drainage   Physical Exam:  General: Pt awake/alert in no acute distress.  Quiet.  Usual stoic self. Eyes: PERRL, normal EOM.  Sclera clear.  No icterus Neuro: CN II-XII intact w/o focal sensory/motor deficits. Lymph: No head/neck/groin lymphadenopathy Psych:  No delerium/psychosis/paranoia.  Oriented x 4 HENT: Normocephalic, Mucus membranes moist.  No thrush Neck: Supple, No tracheal deviation.  No obvious thyromegaly Chest: No pain to chest wall compression.  Good respiratory excursion.  No audible wheezing CV:  Pulses intact.  Regular rhythm.  No major extremity edema MS: Normal AROM mjr joints.  No obvious deformity Abdomen: Soft.  Nondistended.  Nontender.  No evidence of peritonitis.  No incarcerated hernias.  Rectal: Deferred given recent exam. Ext:   No deformity.  No mjr edema.  No cyanosis Skin: No petechiae / purpurea.  No major sores.  Warm and dry    Results:   Cultures: Recent Results (from the past 720 hours)  Culture,  blood (Routine X 2) w Reflex to ID Panel     Status: None (Preliminary result)   Collection Time: 03/11/24  5:33 PM   Specimen: BLOOD  Result Value Ref Range Status   Specimen Description   Final    BLOOD SITE NOT SPECIFIED Performed at [facility], [address], [place], [place] [address]    Special Requests   Final    BOTTLES DRAWN AEROBIC AND ANAEROBIC Blood Culture results may not be optimal due to an inadequate volume of blood received in culture bottles Performed at [facility], [address] [place], [place] [address]    Culture   Final    NO GROWTH 3 DAYS Performed at [facility], [address] [place], [place] [address]    Report Status PENDING  Incomplete  Culture,  blood (Routine X 2) w Reflex to ID Panel     Status: None (Preliminary result)   Collection Time: 03/11/24  5:38 PM   Specimen: BLOOD  Result Value Ref Range Status   Specimen Description   Final    BLOOD SITE NOT SPECIFIED Performed at [facility], [address], [place], [place] [address]    Special Requests   Final    BOTTLES DRAWN AEROBIC AND ANAEROBIC Blood Culture results may not be optimal due to an inadequate volume of blood received in culture bottles Performed at [facility], [address], [place], [place] [address]    Culture   Final    NO GROWTH 3 DAYS Performed at [facility], [address], [place], [place] [address]    Report Status PENDING  Incomplete  Aerobic/Anaerobic Culture w Gram Stain (surgical/deep wound)     Status: None (Preliminary result)   Collection Time: 03/14/24  3:24 PM   Specimen: Sacral; Abscess  Result Value Ref Range Status   Specimen Description   Final    SACRAL Performed at [facility], [address] [place], [place] [address]    Special Requests   Final    NONE Performed at [facility], [address], [place], [place] [address]    Gram Stain    Final    MODERATE WBC PRESENT,BOTH PMN AND MONONUCLEAR NO ORGANISMS SEEN Performed at [facility], [address] [place], [place] [address]    Culture PENDING  Incomplete   Report Status PENDING  Incomplete    Labs: Results for orders placed or performed during the hospital encounter of 03/10/24 (from the past 48 hours)  Potassium     Status: None   Collection Time: 03/13/24  3:21 PM  Result Value Ref Range   Potassium 4.9 3.5 - 5.1 mmol/L    Comment: Performed at [facility], [address], [place], [place] [address]  CBC     Status: Abnormal   Collection Time: 03/14/24  3:52 AM  Result Value Ref Range   WBC 9.9 4.0 - 10.5 K/uL   RBC 3.21 (L) 4.22 - 5.81 MIL/uL   Hemoglobin 8.8 (L) 13.0 - 17.0 g/dL   HCT 72.4 (L) 60.9 - 47.9 %   MCV 85.7 80.0 - 100.0 fL   MCH 27.4 26.0 - 34.0 pg   MCHC 32.0 30.0 - 36.0 g/dL   RDW 78.2 (H) 88.4 - 84.4 %   Platelets 401 (H) 150 - 400 K/uL   nRBC 0.0 0.0 - 0.2 %    Comment: Performed at [facility], [address] [place], [place] [address]  Magnesium      Status: None   Collection Time: 03/14/24  3:52 AM  Result Value Ref Range   Magnesium  2.0 1.7 - 2.4 mg/dL    Comment: Performed at [facility], [address], [place], [place] [address]  Basic metabolic panel with GFR     Status: Abnormal   Collection Time: 03/14/24  3:52 AM  Result Value Ref Range   Sodium 137 135 - 145 mmol/L   Potassium 5.1 3.5 - 5.1 mmol/L   Chloride 106 98 - 111 mmol/L   CO2 22 22 - 32 mmol/L   Glucose, Bld 100 (H) 70 - 99 mg/dL    Comment: Glucose reference range applies only to samples taken after fasting for at least 8 hours.  BUN 12 6 - 20 mg/dL   Creatinine, Ser 8.63 (H) 0.61 - 1.24 mg/dL   Calcium  8.5 (L) 8.9 - 10.3 mg/dL   GFR, Estimated >39 >39 mL/min    Comment: (NOTE) Calculated using the CKD-EPI Creatinine Equation (2021)    Anion gap 9 5 - 15    Comment: Performed at [facility], [address] [place], [place] [address]  Phosphorus     Status: None   Collection Time: 03/14/24  3:52 AM  Result Value Ref Range   Phosphorus 3.2 2.5 - 4.6 mg/dL    Comment: Performed at [facility], [address] [place], [place] [address]  Protime-INR     Status: None   Collection Time: 03/14/24  3:52 AM  Result Value Ref Range   Prothrombin Time 15.1 11.4 - 15.2 seconds   INR 1.1 0.8 - 1.2    Comment: (NOTE) INR goal varies based on device and disease states. Performed at [facility], [address], [place], [place] [address]   Aerobic/Anaerobic Culture w Gram Stain (surgical/deep wound)     Status: None (Preliminary result)   Collection Time: 03/14/24  3:24 PM   Specimen: Sacral; Abscess  Result Value Ref Range   Specimen Description      SACRAL Performed at [facility], [address], [place], [place] [address]    Special Requests      NONE Performed at [facility], [address], [place], [place] [address]    Gram Stain      MODERATE WBC PRESENT,BOTH PMN AND MONONUCLEAR NO ORGANISMS SEEN Performed at [facility], [address] [place], [place] [address]    Culture PENDING    Report Status PENDING   CBC     Status: Abnormal   Collection Time: 03/15/24  1:55 AM  Result Value Ref Range   WBC 8.7 4.0 - 10.5 K/uL   RBC 3.30 (L) 4.22 - 5.81 MIL/uL   Hemoglobin 8.9 (L) 13.0 - 17.0 g/dL   HCT 71.4 (L) 60.9 - 47.9 %   MCV 86.4 80.0 - 100.0 fL   MCH 27.0 26.0 - 34.0 pg   MCHC 31.2 30.0 - 36.0 g/dL   RDW 77.9 (H) 88.4 - 84.4 %   Platelets 405 (H) 150 - 400 K/uL   nRBC 0.0 0.0 - 0.2 %    Comment: Performed at [facility], [address], [place], [place] [address]  Basic metabolic panel     Status: Abnormal   Collection Time: 03/15/24  1:55 AM  Result Value Ref Range   Sodium 137 135 - 145 mmol/L   Potassium 5.3 (H) 3.5 - 5.1  mmol/L   Chloride 107 98 - 111 mmol/L   CO2 23 22 - 32 mmol/L   Glucose, Bld 110 (H) 70 - 99 mg/dL    Comment: Glucose reference range applies only to samples taken after fasting for at least 8 hours.   BUN 16 6 - 20 mg/dL   Creatinine, Ser 8.57 (H) 0.61 - 1.24 mg/dL   Calcium  8.4 (L) 8.9 - 10.3 mg/dL   GFR, Estimated >39 >39 mL/min    Comment: (NOTE) Calculated using the CKD-EPI Creatinine Equation (2021)    Anion gap 7 5 - 15    Comment: Performed at [facility], [address], [place], [place] [address]    Imaging / Studies: CT GUIDED NEEDLE PLACEMENT Result Date:  03/14/2024 INDICATION: Presacral and medial gluteal areas small air collections concerning for abscess. EXAM: CT ASPIRATION OF BILATERAL MEDIAL GLUTEAL INTRAMUSCULAR AIR COLLECTIONS. MEDICATIONS: The patient is currently admitted to the hospital and receiving intravenous antibiotics. The antibiotics were administered within an appropriate time frame prior to the initiation of the procedure. ANESTHESIA/SEDATION: Moderate (conscious) sedation was employed during this procedure. A total of Versed  2.0 mg and Fentanyl  150 mcg was administered intravenously by the radiology nurse. Total intra-service moderate Sedation Time: 12 minutes. The patient's level of consciousness and vital signs were monitored continuously by radiology nursing throughout the procedure under my direct supervision. COMPLICATIONS: None immediate. PROCEDURE: Informed written consent was obtained from the patient after a thorough discussion of the procedural risks, benefits and alternatives. All questions were addressed. Maximal Sterile Barrier Technique was utilized including caps, mask, sterile gowns, sterile gloves, sterile drape, hand hygiene and skin antiseptic. A timeout was performed prior to the initiation of the procedure. Previous imaging reviewed. Patient position prone. Noncontrast localization CT performed. The medial gluteal air  collections were localized and marked bilaterally. Left medial gluteal aspiration: Under sterile conditions and local anesthesia, the 18 gauge trocar needle was advanced from a medial gluteal oblique approach into the air collection. Scant blood-tinged fluid aspirated. Sample sent for culture. Right medial gluteal aspiration: In a similar fashion, an 18 gauge trocar needle was advanced into the medial gluteal air collection on the right side. Needle position confirmed with CT. Syringe aspiration did not yield any fluid. Needle removed. IMPRESSION: Successful CT-guided aspirations of the medial gluteal air collections bilaterally yielding only scant fluid from the left side as above. Electronically Signed   By: CHRISTELLA.  Shick M.D.   On: 03/14/2024 16:41   US  EKG SITE RITE Result Date: 03/13/2024 If [place] image not attached, placement could not be confirmed due to current cardiac rhythm.   Medications / Allergies: per chart  Antibiotics: Anti-infectives (From admission, onward)    Start     Dose/Rate Route Frequency Ordered Stop   03/15/24 0900  piperacillin -tazobactam (ZOSYN ) IVPB 3.375 g        3.375 g 12.5 mL/hr over 240 Minutes Intravenous Every 8 hours 03/15/24 0754           Note: Portions of this report may have been transcribed using voice recognition software. Every effort was made to ensure accuracy; however, inadvertent computerized transcription errors may be present.   Any transcriptional errors that result from this process are unintentional.    Elspeth KYM Schultze, MD, FACS, MASCRS Esophageal, Gastrointestinal & Colorectal Surgery Robotic and Minimally Invasive Surgery  [facility] [address], [address] [place], [place] [address] [phone] Fax [phone] Main  CONTACT INFORMATION: Weekday (9AM-5PM): Call [facility] main office at [phone] Weeknight (5PM-9AM) or Weekend/Holiday: Check EPIC Web Links tab & use  AMION (password  [email or web address]) for General Surgery [facility] coverage  Please, DO NOT use SecureChat  (it is not reliable communication to reach operating surgeons & will lead to a delay in care).   Epic staff messaging available for outptient concerns needing 1-2 business day response.      03/15/2024  8:11 AM

## 2024-03-15 NOTE — Progress Notes (Signed)
 Addendum  After discussing with patient earlier this afternoon, completed his DC paperwork.  Called him back to update him regarding repeating BMP to check his potassium prior to discharge and he informed that he was having some kind of reaction. Reviewed with patient's RN, reports that there are some bumps seen on his arm at the site of the IV but not hives. Uncertain what he is reacting to, RN does not feel that this is the Dilaudid  which she has received before.  He has only received his first dose of Zosyn . Ordered Benadryl  as needed.  Cancel plans for discharge.  Follow closely overnight.  Reassess in a.m. or as needed.  Trenda Mar, MD,  FACP, [facility], [facility], [facility]   [facility] Ad[facility] [place]     To contact the attending provider between 7A-7P or the covering provider during after hours 7P-7A, please log into the web site [email or web address] and access using universal [place] password for that web site. If you do not have the password, please call the hospital operator.

## 2024-03-15 NOTE — Plan of Care (Signed)
   Problem: Activity: Goal: Risk for activity intolerance will decrease Outcome: Progressing   Problem: Nutrition: Goal: Adequate nutrition will be maintained Outcome: Completed/Met

## 2024-03-15 NOTE — Progress Notes (Signed)
 [facility] Progress Note  CC:  Rectal pain, Crohn's disease   Subjective: He denies having any abdominal pain.  His rectal pain is fairly constant [place] 7 scale 1-10.  No bowel movement for the past week.  No nausea or vomiting.  No chest pain or shortness of breath.   Objective:  Vital signs in last 24 hours: Temp:  [97.9 F (36.6 C)-98.8 F (37.1 C)] 98.4 F (36.9 C) (07/24 1309) Pulse Rate:  [51-72] 54 (07/24 1309) Resp:  [10-18] 18 (07/24 1309) BP: (92-142)/(56-97) 128/68 (07/24 1309) SpO2:  [97 %-100 %] 100 % (07/24 1309) Last BM Date : 03/08/24 (per pt report) General: Alert 38 year old male in no acute distress. Heart: Regular rate and rhythm, no murmurs. Pulm: Sounds clear throughout. Abdomen: Soft, nondistended.  Nontender.  Positive bowel sounds all 4 quadrants. Extremities:  No edema. Neurologic:  Alert and oriented x 4. Grossly normal neurologically. Psych:  Alert and cooperative. Normal mood and affect.  Intake/Output from previous day: 07/23 0701 - 07/24 0700 In: 600 [P.O.:600] Out: -  Intake/Output this shift: Total I/O In: 130 [P.O.:120; I.V.:10] Out: -   Lab Results: Recent Labs    03/13/24 0442 03/14/24 0352 03/15/24 0155  WBC 10.4 9.9 8.7  HGB 8.6* 8.8* 8.9*  HCT 27.1* 27.5* 28.5*  PLT 359 401* 405*   BMET Recent Labs    03/13/24 0442 03/13/24 1521 03/14/24 0352 03/15/24 0155  NA 133*  --  137 137  K 5.3* 4.9 5.1 5.3*  CL 104  --  106 107  CO2 22  --  22 23  GLUCOSE 89  --  100* 110*  BUN 14  --  12 16  CREATININE 1.39*  --  1.36* 1.42*  CALCIUM  8.2*  --  8.5* 8.4*   LFT No results for input(s): PROT, ALBUMIN , AST, ALT, ALKPHOS, BILITOT, BILIDIR, IBILI in the last 72 hours. PT/INR Recent Labs    03/14/24 0352  LABPROT 15.1  INR 1.1   Hepatitis Panel No results for input(s): HEPBSAG, HCVAB, HEPAIGM, HEPBIGM in the last 72 hours.  CT GUIDED NEEDLE PLACEMENT Result Date:  03/14/2024 INDICATION: Presacral and medial gluteal areas small air collections concerning for abscess. EXAM: CT ASPIRATION OF BILATERAL MEDIAL GLUTEAL INTRAMUSCULAR AIR COLLECTIONS. MEDICATIONS: The patient is currently admitted to the hospital and receiving intravenous antibiotics. The antibiotics were administered within an appropriate time frame prior to the initiation of the procedure. ANESTHESIA/SEDATION: Moderate (conscious) sedation was employed during this procedure. A total of Versed  2.0 mg and Fentanyl  150 mcg was administered intravenously by the radiology nurse. Total intra-service moderate Sedation Time: 12 minutes. The patient's level of consciousness and vital signs were monitored continuously by radiology nursing throughout the procedure under my direct supervision. COMPLICATIONS: None immediate. PROCEDURE: Informed written consent was obtained from the patient after a thorough discussion of the procedural risks, benefits and alternatives. All questions were addressed. Maximal Sterile Barrier Technique was utilized including caps, mask, sterile gowns, sterile gloves, sterile drape, hand hygiene and skin antiseptic. A timeout was performed prior to the initiation of the procedure. Previous imaging reviewed. Patient position prone. Noncontrast localization CT performed. The medial gluteal air collections were localized and marked bilaterally. Left medial gluteal aspiration: Under sterile conditions and local anesthesia, the 18 gauge trocar needle was advanced from a medial gluteal oblique approach into the air collection. Scant blood-tinged fluid aspirated. Sample sent for culture. Right medial gluteal aspiration: In a similar fashion, an 18 gauge trocar needle  was advanced into the medial gluteal air collection on the right side. Needle position confirmed with CT. Syringe aspiration did not yield any fluid. Needle removed. IMPRESSION: Successful CT-guided aspirations of the medial gluteal air  collections bilaterally yielding only scant fluid from the left side as above. Electronically Signed   By: CHRISTELLA.  Shick M.D.   On: 03/14/2024 16:41   US  EKG SITE RITE Result Date: 03/13/2024 If [facility] image not attached, placement could not be confirmed due to current cardiac rhythm.  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   On Ustekinumab , last injection was 7/16. CTAP 7/20 showed stable fluid and soft tissue gas in the presacral region and along the medial left gluteal region without abscess and no evidence of sacral osteomyelitis.   Evaluated by ID for suspected recurrent coccygeal osteomyelitis. Pelvic MRI 7/20 showed coccygeal osteomyelitis with erosion of the coccyx, possible early osteomyelitis S2-S5 of the sacrum, presacral abscesses and small abscesses present along the anterior margin of the left gluteal maximus muscle without evidence of a perianal fistula. General surgery recommended IV antibiotics without surgical intervention. IR consulted for perirectal/presacral abscess aspiration which was unsuccessful, aspiration yielded a scant amount of blood-tinged fluid without a significant fluid pocket to target.  Sed rate 90. CRP 12.5. Ustekinumab  was recently increased to every 4 weeks, last dose 1 week ago.  Patient has not been on Ustekinumab  long enough to declare drug failure.   A diverting  colostomy which would be the most effective way to heal his perianal disease was recommended, however, patient did not wish to pursue. Ok to continue Ustekinumab  per ID as long as the patient's infection is controlled. Started on Zosyn  IV today.   Seen by Dr. Sheldon today, who also discussed a diverting colostomy, patient declined.  A colonoscopy with biopsies would be required prior to pursuing surgical intervention.  -Appreciate general surgeries recommendations  -Continue Zosyn  IV per ID, to complete 6 week course 7/23 - 04/25/2024  -MiraLAX  twice daily - Hold any immunosuppressive medications until infection identified and adequately controlled   - Pain management per the hospitalist  - Await further recommendations per Dr. [name]   Anemia, secondary to chronic disease with prior iron  deficiency + current vitamin B 12 deficiency, recently evaluated by hematology as an outpatient. Admission Hg 8.9 (Hg 10.9 three weeks ago) -> 8.7 -> 8.8. Normal iron  levels and B12 level 105 on 02/14/2024.  -Continue vitamin B 12 injections  -CBC in AM  -Transfuse for Hg < 7 or as needed if symptomatic   CKD    Principal Problem:   Rectal pain Active Problems:   Abscess of male pelvis (HCC)   Anal fistula   B12 deficiency   Crohn's disease with fistula (HCC)   Osteomyelitis (HCC)   Perianal abscess     LOS: 4 days   Elida CHRISTELLA Shawl  03/15/2024, 2:36 PM

## 2024-03-15 NOTE — Discharge Summary (Signed)
 Physician Discharge Summary  Shawn [name] FMW:[id number] DOB: 03/08/1986  PCP: [name], FNP  Admitted from: Home Discharged to: Home  Admit date: 03/10/2024 Discharge date: 03/16/2024  Recommendations for Outpatient Follow-up:    Follow-up Information     [name], MD. Call.   Specialties: General Surgery, Colon and Rectal Surgery Why: As needed Contact information: [address] [place] [address] [phone]         [name], FNP. Schedule an appointment as soon as possible for a visit in 5 day(s).   Specialty: Family Medicine Why: To be seen with repeat labs (CBC, CMP and magnesium ).  Please also review lab work related to OPAT.  Consider outpatient palliative care consultation for goals of care discussions, symptom management.  Could also consider pain management consult. Contact information: [address] [place] [address] [phone]         [name] HERO, MD. Schedule an appointment as soon as possible for a visit in 1 month(s).   Specialty: Gastroenterology Contact information: [address] [place] [place] [address] [phone]         [facility] Follow up.   Contact information: ([facility] RN) [address] [place], [place] [address] [phone]                 Home Health: RN    Equipment/Devices: None    Discharge Condition: Improved and stable.   Code Status: Full Code Diet recommendation:  Discharge Diet Orders (From admission, onward)     Start     Ordered   03/15/24 0000  Diet general        03/15/24 1550             Discharge Diagnoses:  Principal Problem:   Rectal pain Active Problems:   Abscess of male pelvis (HCC)   Anal fistula   B12 deficiency   Crohn's disease with fistula (HCC)   Osteomyelitis (HCC)   Perianal abscess   Brief Hospital Course:  38 year old male with medical history  significant for complicated Crohn's proctitis with severe complicated perianal disease, history of perianal fistulas, perirectal abscess, coccygeal osteomyelitis, s/p coccygectomy, stage II CKD, cardiomyopathy, presented to ED with complaints of rectal/sacral/coccygeal area pain.  Found to have new presacral abscesses and ongoing coccygeal osteomyelitis.  [facility], ID and IR consulted.  IR attempted aspiration on 7/23   Assessment & Plan:    Complicated Crohn's disease with presacral abscesses and ongoing coccygeal osteomyelitis [name], ID and IR input appreciated. IR attempted CT-guided aspiration on 7/3 and left aspirate yielded only scant blood-tinged fluid, right aspiration was dry tap.  Gram stain showed no organisms, showed moderate WBCs both neutrophils and mononuclear cells, cultures pending. Currently on IV Zosyn . Refer to [facility] input for details from 7/22: They recommend fecal diversion but it appears that patient is not ready for this yet.  They are okay to advance diet, ordered and tolerated.  GI signed off 7/23 and they will arrange outpatient follow-up with Dr. Suzann in 1 month.  I communicated with Dr. [name] on day of discharge, no inpatient procedures planned, has cleared patient for discharge home. Continue Stelara  as outpatient with next dose on August 13.  This is not considered a medication failure. Multimodality pain management.  Pain controlled mostly on oral pain meds and he feels comfortable returning home on these.  Can consider outpatient care consultation for discussion of goals of  care and symptom management and even pain management consult via his PCP. Blood cultures, negative to date. General surgery follow-up appreciated, refer to their detailed note from 7/24, no current surgical plans. ID follow-up appreciated, medicated with them.  They have cleared him for discharge on empiric Zosyn  x 6 weeks and plan to reassess imaging/symptoms near end of  antibiotics.  He has follow-up appointment with ID early August. Patient was supposed to DC yesterday but developed some itching and questionable skin rash.  However upon careful review, he had some bumps on his right upper arm which probably were chronic even prior to admission but on review today, has not had any further new rashes and above has improved.  Also patient's pain appears to be controlled on oxycodone  despite him asking for and receiving IV Dilaudid .  He has not complained of significant pain during my visit yesterday or today.   Stage II CKD Not stage III as per earlier notes.  Creatinine stable in the 1.3 range.  Periodically monitor BMP.  Creatinine has slightly worsened.  Encourage patient to increase oral fluid intake and advised close outpatient follow-up with PCP with repeat labs.   Anemia of chronic disease Hemoglobin stable in the mid 8 g range.   Hyperkalemia Mild and intermittent.  No mention of hemolysis.  Unclear etiology.  Not a diabetic and on no meds that would cause hyperkalemia Lokelma  10 g x 1 early this morning.  Repeat BMP now to check potassium.  Discussed at length with patient regarding avoiding high potassium diet such as dried fruits etc. and he verbalized understanding. Follow-up potassium down to 4.9 and then 5, upper limit of normal.   Chronic Constipation Patient has not had a BM in approximately a week but he is asymptomatic apart from the constipation.  He has no obstructive symptoms.  Tried Dulcolax suppository which did not work.  Discussed with [facility] MD, okay with giving an enema but patient declined.  Therefore increasing MiraLAX  to 17 g 3 times daily, added Senokot-S 2 tabs twice daily, Dulcolax 10 mg rectally as needed for constipation.    Body mass index is 23.53 kg/m.     Consultants:   [facility] Infectious disease Interventional radiology   Discharge Instructions  Discharge Instructions     [facility] pharmacist  to adjust dose for Vancomycin , Aminoglycosides and other anti-infective therapies as requested by physician.   Complete by: As directed    [facility] to provide Cath Flo 2mg    Complete by: As directed    Administer for PICC line occlusion and as ordered by physician for other access device issues.   Anaphylaxis Kit: Provided to treat any anaphylactic reaction to the medication being provided to the patient if First Dose or when requested by physician   Complete by: As directed    Epinephrine  1mg /ml vial / amp: Administer 0.3mg  (0.73ml) subcutaneously once for moderate to severe anaphylaxis, nurse to call physician and pharmacy when reaction occurs and call 911 if needed for immediate care   Diphenhydramine  50mg /ml IV vial: Administer 25-50mg  IV/IM PRN for first dose reaction, rash, itching, mild reaction, nurse to call physician and pharmacy when reaction occurs   Sodium Chloride  0.9% NS 500ml IV: Administer if needed for hypovolemic blood pressure drop or as ordered by physician after call to physician with anaphylactic reaction   Call MD for:  difficulty breathing, headache or visual disturbances   Complete by: As directed    Call MD for:  extreme fatigue   Complete by: As directed    Call MD for:  persistant dizziness or light-headedness   Complete by: As directed    Call MD for:  persistant nausea and vomiting   Complete by: As directed    Call MD for:  severe uncontrolled pain   Complete by: As directed    Call MD for:  temperature >100.4   Complete by: As directed    Change dressing on IV access line weekly and PRN   Complete by: As directed    Diet general   Complete by: As directed    Flush IV access with Sodium Chloride  0.9% and Heparin  10 units/ml or 100 units/ml   Complete by: As directed    Home infusion instructions - [facility]   Complete by: As directed    Instructions: Flush IV access with Sodium Chloride  0.9% and Heparin  10units/ml or 100units/ml    Change dressing on IV access line: Weekly and PRN   Instructions Cath Flo 2mg : Administer for PICC Line occlusion and as ordered by physician for other access device   [facility] pharmacist to adjust dose for: Vancomycin , Aminoglycosides and other anti-infective therapies as requested by physician   Increase activity slowly   Complete by: As directed    Method of administration may be changed at the discretion of home infusion pharmacist based upon assessment of the patient and/or caregiver's ability to self-administer the medication ordered   Complete by: As directed    No wound care   Complete by: As directed         Medication List     STOP taking these medications    acetaminophen -codeine  300-30 MG tablet Commonly known as: TYLENOL  #3   ibuprofen  200 MG tablet Commonly known as: ADVIL    senna 8.6 MG Tabs tablet Commonly known as: SENOKOT       TAKE these medications    acetaminophen  500 MG tablet Commonly known as: TYLENOL  Take 2 tablets (1,000 mg total) by mouth every 8 (eight) hours as needed for mild pain (pain score 1-3) or fever. What changed: reasons to take this   Dulcolax 5 MG EC tablet Generic drug: bisacodyl  Take 2 tablets (10 mg total) by mouth daily as needed for moderate constipation or mild constipation.   oxyCODONE  5 MG immediate release tablet Commonly known as: Oxy IR/ROXICODONE  Take 1-2 tablets (5-10 mg total) by mouth every 6 (six) hours as needed for moderate pain (pain score 4-6) or severe pain (pain score 7-10).   piperacillin -tazobactam IVPB Commonly known as: ZOSYN  Inject 13.5 g into the vein daily. Indication:  Peri-rectal abscess/osteo First Dose: Yes Last Day of Therapy:  04/26/24 Labs - Once weekly:  CBC/D and BMP, Labs - Once weekly: ESR and CRP Method of administration: Elastomeric (Continuous infusion) Method of administration may be changed at the discretion of home infusion pharmacist based upon assessment of the  patient and/or caregiver's ability to self-administer the medication ordered.   polyethylene glycol 17 g packet Commonly known as: MIRALAX  / GLYCOLAX  Take 17 g by mouth 3 (three) times daily.   senna-docusate 8.6-50 MG tablet Commonly known as: Senokot-S Take 2 tablets by mouth 2 (two) times daily.   Stelara  90 MG/ML Sosy injection Generic drug: ustekinumab  Inject 1 mL (90 mg total) into the skin every 28 (twenty-eight) days.   Vitamin D  (Ergocalciferol ) 1.25 MG (50000 UNIT) Caps capsule Commonly known as: DRISDOL  Take 1 capsule (50,000 Units total) by mouth every 7 (seven)  days.       Allergies  Allergen Reactions   Shrimp [Shellfish Allergy] Shortness Of Breath   Nsaids Other (See Comments)    CROHN'S DISEASE = NO NSAIDS   Bactoshield Chg [Chlorhexidine  Gluconate] Itching      Procedures/Studies: CT GUIDED NEEDLE PLACEMENT Result Date: 03/14/2024 INDICATION: Presacral and medial gluteal areas small air collections concerning for abscess. EXAM: CT ASPIRATION OF BILATERAL MEDIAL GLUTEAL INTRAMUSCULAR AIR COLLECTIONS. MEDICATIONS: The patient is currently admitted to the hospital and receiving intravenous antibiotics. The antibiotics were administered within an appropriate time frame prior to the initiation of the procedure. ANESTHESIA/SEDATION: Moderate (conscious) sedation was employed during this procedure. A total of Versed  2.0 mg and Fentanyl  150 mcg was administered intravenously by the radiology nurse. Total intra-service moderate Sedation Time: 12 minutes. The patient's level of consciousness and vital signs were monitored continuously by radiology nursing throughout the procedure under my direct supervision. COMPLICATIONS: None immediate. PROCEDURE: Informed written consent was obtained from the patient after a thorough discussion of the procedural risks, benefits and alternatives. All questions were addressed. Maximal Sterile Barrier Technique was utilized including caps,  mask, sterile gowns, sterile gloves, sterile drape, hand hygiene and skin antiseptic. A timeout was performed prior to the initiation of the procedure. Previous imaging reviewed. Patient position prone. Noncontrast localization CT performed. The medial gluteal air collections were localized and marked bilaterally. Left medial gluteal aspiration: Under sterile conditions and local anesthesia, the 18 gauge trocar needle was advanced from a medial gluteal oblique approach into the air collection. Scant blood-tinged fluid aspirated. Sample sent for culture. Right medial gluteal aspiration: In a similar fashion, an 18 gauge trocar needle was advanced into the medial gluteal air collection on the right side. Needle position confirmed with CT. Syringe aspiration did not yield any fluid. Needle removed. IMPRESSION: Successful CT-guided aspirations of the medial gluteal air collections bilaterally yielding only scant fluid from the left side as above. Electronically Signed   By: CHRISTELLA.  Shick M.D.   On: 03/14/2024 16:41   US  EKG SITE RITE Result Date: 03/13/2024 If [facility] image not attached, placement could not be confirmed due to current cardiac rhythm.  MR PELVIS W WO CONTRAST Result Date: 03/12/2024 CLINICAL DATA:  Osteomyelitis or sacral abscess EXAM: MRI PELVIS WITHOUT AND WITH CONTRAST TECHNIQUE: Multiplanar multisequence MR imaging of the pelvis was performed both before and after administration of intravenous contrast. CONTRAST:  8mL GADAVIST  GADOBUTROL  1 MMOL/ML IV SOLN COMPARISON:  CT pelvis 03/11/2024 FINDINGS: OSSEOUS STRUCTURES AND JOINTS: Coccygeal osteomyelitis with erosion of the coccyx noted. Low-grade marrow edema in S2 through S5 segments of the sacrum, probably from early osteomyelitis. No SI joint effusion or hip joint effusion. MUSCULOTENDINOUS: Presacral abscesses are observed with abnormal enhancement extending into the anterior S2, S3, and S4 foramina. The main portion of the conglomerate abscess  measures about 6.7 by 1.1 by 4.4 cm (volume = 17 cm^3) although this abscess may be multiloculated. Small abscesses are also present along the anterior margin of the left gluteus maximus muscle, again potentially loculated but measuring about 6.0 by 1.5 by 2.3 cm as shown on image 30 series 10. Local myositis noted in the bilateral piriformis musculature (especially medially) and in the medial gluteus maximus musculature. Mild edema and enhancement in the lower posterior paraspinal musculature (erector spinae and longissimus thoracis muscles) bilaterally could reflect early myositis. SACRAL PLEXUS: Abscess noted extending along particularly the S3 and S4 components the sacral plexus. Accentuated enhancement of the sciatic nerves, left greater  than right, suspicious for sciatic neuropathy/inflammation. OTHER: On image 23 series 11 there appears to be a seton in the posterior perianal space. Scattered perirectal lymph nodes are likely inflammatory. Today's exam was not optimized specifically look at the anal canal or for perianal fistula, the orthopedic pelvis protocol was utilized. IMPRESSION: 1. 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 2. Low-grade marrow edema in S2 through S5 segments of the sacrum, probably from early osteomyelitis. 3. Presacral abscesses with abnormal enhancement extending into the anterior S2, S3, and S4 foramina. The main portion of the conglomerate abscess measures about 6.7 by 1.1 by 4.4 cm (volume = 17 cm^ 3) although this abscess may be multiloculated. Small abscesses are also present along the anterior margin of the left gluteus maximus muscle, again potentially loculated but measuring about 6.0 by 1.5 by 2.3 cm. 4. Local myositis in the bilateral piriformis musculature (especially medially) and in the medial gluteus maximus musculature. 5. Mild edema and enhancement in the lower posterior paraspinal musculature (erector spinae and longissimus thoracis muscles) bilaterally  could reflect early myositis. 6. Accentuated enhancement of the sciatic nerves, left greater than right, suspicious for sciatic neuropathy/inflammation. 7. There appears to be a seton in the posterior perianal space. Scattered perirectal lymph nodes are likely inflammatory. Today's exam was not optimized specifically look at the anal canal or for perianal fistula, the orthopedic pelvis protocol was utilized. Electronically Signed   By: Ryan Salvage M.D.   On: 03/12/2024 09:02   CT ABDOMEN PELVIS W CONTRAST Result Date: 03/11/2024 EXAM: CT ABDOMEN AND PELVIS WITH CONTRAST 03/11/2024 01:33:53 AM TECHNIQUE: CT of the abdomen and pelvis was performed with the administration of intravenous contrast. Multiplanar reformatted images are provided for review. Automated exposure control, iterative reconstruction, and/or weight based adjustment of the mA/kV was utilized to reduce the radiation dose to as low as reasonably achievable. 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 recurrence of osteomyelitis of sacrum/coccyx. FINDINGS: LOWER CHEST: No acute abnormality. LIVER: The liver is unremarkable. GALLBLADDER AND BILE DUCTS: Gallbladder is unremarkable. No biliary ductal dilatation. SPLEEN: No acute abnormality. PANCREAS: No acute abnormality. ADRENAL GLANDS: No acute abnormality. KIDNEYS, URETERS AND BLADDER: Numerous bilateral renal cysts measuring up to 4.2 cm in the anterior left lower kidney, benign (Bosniak 1). No follow up is recommended. No stones in the kidneys or ureters. No hydronephrosis. No perinephric or periureteral stranding. Urinary bladder is unremarkable. GI AND BOWEL: Stomach demonstrates no acute abnormality. There is no bowel obstruction. No bowel wall thickening. Normal appendix. 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 PERITONEUM AND RETROPERITONEUM: Stable fluid and soft tissue gas in the presacral region (image 77) and along the medial left gluteal region/ischiorectal fossa  (image 91). VASCULATURE: Aorta is normal in caliber. LYMPH NODES: No lymphadenopathy. REPRODUCTIVE ORGANS: No acute abnormality. BONES AND SOFT TISSUES: No cortical destruction to suggest sacral osteomyelitis. IMPRESSION: 1. 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 2. Stable fluid and soft tissue gas in the presacral region and along the medial left gluteal region/ischiorectal fossa. No new abscess. 3. No cortical destruction to suggest sacral osteomyelitis. Electronically signed by: Pinkie Pebbles MD 03/11/2024 01:46 AM EDT RP Workstation: [id number]      Subjective: Patient seen this morning.  History as noted above.  No BM in several days but does not have any abdominal pain, nausea or vomiting.  Is tolerating diet.  Passing flatus.  Reports BM every 3 to 4 days at home PTA.  Did not complain of back pain during my  visit.  Did not look in discomfort either.  Reported some bumps itching on right cubital fossa but better than yesterday.  Discharge Exam:  Vitals:   03/15/24 0850 03/15/24 1309 03/15/24 2135 03/16/24 0633  BP: 111/64 128/68 113/78 103/76  Pulse: (!) 57 (!) 54 60 (!) 58  Resp: 18 18 18 18   Temp: 98.4 F (36.9 C) 98.4 F (36.9 C) 98.1 F (36.7 C) 97.9 F (36.6 C)  TempSrc: Oral Oral Oral   SpO2: 98% 100% 99% 100%  Weight:      Height:        General exam: Young male, moderately built and nourished lying comfortably supine in bed without distress.  Oral mucosa moist. Respiratory system: Clear to auscultation. Respiratory effort normal. Cardiovascular system: S1 & S2 heard, RRR. No JVD, murmurs, rubs, gallops or clicks. No pedal edema. Gastrointestinal system: Abdomen is nondistended, soft and nontender. No organomegaly or masses felt. Normal bowel sounds heard. Central nervous system: Alert and oriented. No focal neurological deficits. Extremities: Symmetric 5 x 5 power. Skin: Low sacral area dressing clean and dry.  Right upper extremity evaluated, not sure I was able to appreciate  any true allergic reaction or rash.  Noticed a couple of tiny bumps without acute findings. Psychiatry: Judgement and insight appear normal. Mood & affect appropriate.       The results of significant diagnostics from this hospitalization (including imaging, microbiology, ancillary and laboratory) are listed below for reference.     Microbiology: Recent Results (from the past 240 hours)  Culture, blood (Routine X 2) w Reflex to ID Panel     Status: None   Collection Time: 03/11/24  5:33 PM   Specimen: BLOOD  Result Value Ref Range Status   Specimen Description   Final    BLOOD SITE NOT SPECIFIED Performed at [facility], [address] [place], [place] [address]    Special Requests   Final    BOTTLES DRAWN AEROBIC AND ANAEROBIC Blood Culture results may not be optimal due to an inadequate volume of blood received in culture bottles Performed at [facility], [address], [place], [place] [address]    Culture   Final    NO GROWTH 5 DAYS Performed at [facility], [address], [place], [place] [address]    Report Status 03/16/2024 FINAL  Final  Culture, blood (Routine X 2) w Reflex to ID Panel     Status: None   Collection Time: 03/11/24  5:38 PM   Specimen: BLOOD  Result Value Ref Range Status   Specimen Description   Final    BLOOD SITE NOT SPECIFIED Performed at [facility], [address], [place], [place] [address]    Special Requests   Final    BOTTLES DRAWN AEROBIC AND ANAEROBIC Blood Culture results may not be optimal due to an inadequate volume of blood received in culture bottles Performed at [facility], [address], [place], [place] [address]    Culture   Final    NO GROWTH 5 DAYS Performed at [facility], [address] [place], [place] [address]    Report Status 03/16/2024 FINAL  Final  Aerobic/Anaerobic Culture w Gram Stain (surgical/deep wound)     Status: None (Preliminary result)    Collection Time: 03/14/24  3:24 PM   Specimen: Sacral; Abscess  Result Value Ref Range Status   Specimen Description   Final    SACRAL  Performed at [facility], [address], [place], [place] [address]    Special Requests   Final    NONE Performed at [facility], [address], [place], [place] [address]    Gram Stain   Final    MODERATE WBC PRESENT,BOTH PMN AND MONONUCLEAR NO ORGANISMS SEEN    Culture   Final    NO GROWTH 2 DAYS Performed at [facility], [address], [place], [place] [address]    Report Status PENDING  Incomplete     Labs: CBC: Recent Labs  Lab 03/11/24 0001 03/12/24 0427 03/13/24 0442 03/14/24 0352 03/15/24 0155  WBC 15.9* 13.7* 10.4 9.9 8.7  NEUTROABS 11.2*  --   --   --   --   HGB 8.9* 8.7* 8.6* 8.8* 8.9*  HCT 28.4* 27.0* 27.1* 27.5* 28.5*  MCV 85.8 86.0 87.1 85.7 86.4  PLT 378 359 359 401* 405*    Basic Metabolic Panel: Recent Labs  Lab 03/13/24 0442 03/13/24 1521 03/14/24 0352 03/15/24 0155 03/15/24 1620 03/16/24 0329  NA 133*  --  137 137 140 140  K 5.3* 4.9 5.1 5.3* 4.9 5.0  CL 104  --  106 107 111 110  CO2 22  --  22 23 22 24   GLUCOSE 89  --  100* 110* 124* 109*  BUN 14  --  12 16 19  21*  CREATININE 1.39*  --  1.36* 1.42* 1.32* 1.62*  CALCIUM  8.2*  --  8.5* 8.4* 8.6* 8.5*  MG 2.0  --  2.0  --   --   --   PHOS 3.4  --  3.2  --   --   --     Liver Function Tests: Recent Labs  Lab 03/11/24 0001  AST 13*  ALT 16  ALKPHOS 80  BILITOT 0.5  PROT 7.7  ALBUMIN  3.0*     Urinalysis    Component Value Date/Time   COLORURINE YELLOW 10/25/2023 2008   APPEARANCEUR CLEAR 10/25/2023 2008   LABSPEC 1.021 10/25/2023 2008   PHURINE 5.0 10/25/2023 2008   GLUCOSEU NEGATIVE 10/25/2023 2008   HGBUR NEGATIVE 10/25/2023 2008   BILIRUBINUR NEGATIVE 10/25/2023 2008   KETONESUR NEGATIVE 10/25/2023 2008   PROTEINUR 30 (A) 10/25/2023 2008   UROBILINOGEN 1.0 01/10/2014 2044    NITRITE NEGATIVE 10/25/2023 2008   LEUKOCYTESUR NEGATIVE 10/25/2023 2008      Time coordinating discharge: 35 minutes  SIGNED:  Trenda Mar, MD,  FACP, [facility], [facility], [facility]   [facility] [place]     To contact the attending provider between 7A-7P or the covering provider during after hours 7P-7A, please log into the web site [email or web address] and access using universal [place] password for that web site. If you do not have the password, please call the hospital operator.

## 2024-03-15 NOTE — Progress Notes (Signed)
 PROGRESS NOTE   Shawn [name]  FMW:[id number]    DOB: [birth year]    DOA: 03/10/2024  PCP: [name], FNP   I have briefly reviewed patients previous medical records in [facility].   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  Found to have new presacral abscesses and ongoing coccygeal osteomyelitis.  [facility], ID and IR consulted.  IR attempted aspiration on 7/23  Assessment & Plan:   Complicated Crohn's disease with presacral abscesses and ongoing coccygeal osteomyelitis [facility], ID and IR input appreciated. IR attempted CT-guided aspiration on 7/3 and left aspirate yielded only scant blood-tinged fluid, right aspiration was dry tap.  Gram stain showed no organisms, showed moderate WBCs both neutrophils and mononuclear cells, cultures pending. Currently on IV Zosyn . Referred to [facility] input for details from 7/22: They recommend fecal diversion but it appears that patient is not ready for this yet.  They are okay to advance diet, ordered.  GI signed off 7/23 and they will arrange outpatient follow-up with Dr. Suzann. Continue Stelara  as outpatient with next dose on August 13. Multimodality pain management. Blood cultures, negative to date. General surgery follow-up appreciated, refer to their detailed note from 7/24, no current surgical plans. Communicated with ID who will see him later today to address antibiotic management at discharge.  Stage II CKD Not stage III as per earlier notes.  Creatinine stable in the 1.3 range.  Periodically monitor BMP  Anemia of chronic disease Hemoglobin stable in the mid 8 g range.  Hyperkalemia Mild and intermittent.  No  mention of hemolysis.  Unclear etiology.  Not a diabetic and on no meds that would cause hyperkalemia Lokelma  10 g x 2 doses today and follow BMP in a.m. consider low potassium diet.  Constipation As per surgeon's note, no BM in a week's time.  No obstructive symptoms.  Agree with MiraLAX    Body mass index is 23.53 kg/m.   DVT prophylaxis: enoxaparin  (LOVENOX ) injection 40 mg Start: 03/11/24 1000     Code Status: Full Code:  Family Communication: None at bedside Disposition:  Status is: Inpatient Remains inpatient appropriate because: IV antibiotics, continued pain management, await discharge advice regarding OPAT and treating hyperkalemia.     Consultants:   [facility] Infectious disease Interventional radiology  Procedures:     Subjective:  Tolerated 100% of regular diet.  Reports low back pain is better controlled with adjustment of his pain medication regimen, 7/10 this morning.  No other complaints.  Objective:   Vitals:   03/14/24 2114 03/15/24 0144 03/15/24 0601 03/15/24 0850  BP: 120/80 92/69 122/86 111/64  Pulse: 72 64 69 (!) 57  Resp: 18 17 18 18   Temp: 98.4 F (36.9 C) 97.9 F (36.6 C) 98.6 F (37 C) 98.4 F (36.9 C)  TempSrc: Oral Oral  Oral  SpO2: 100% 98% 100% 98%  Weight:      Height:        General exam: Young male, moderately built and nourished lying comfortably supine in bed without distress.  Oral mucosa moist. Respiratory system: Clear to auscultation. Respiratory effort normal. Cardiovascular system: S1 & S2 heard, RRR. No JVD, murmurs, rubs, gallops or clicks. No pedal edema. Gastrointestinal system: Abdomen is nondistended, soft and nontender. No organomegaly or masses felt. Normal  bowel sounds heard. Central nervous system: Alert and oriented. No focal neurological deficits. Extremities: Symmetric 5 x 5 power. Skin: Low sacral area dressing clean and dry. Psychiatry: Judgement and insight appear normal. Mood & affect appropriate.      Data Reviewed:   I have personally reviewed following labs and imaging studies   CBC: Recent Labs  Lab 03/11/24 0001 03/12/24 0427 03/13/24 0442 03/14/24 0352 03/15/24 0155  WBC 15.9*   < > 10.4 9.9 8.7  NEUTROABS 11.2*  --   --   --   --   HGB 8.9*   < > 8.6* 8.8* 8.9*  HCT 28.4*   < > 27.1* 27.5* 28.5*  MCV 85.8   < > 87.1 85.7 86.4  PLT 378   < > 359 401* 405*   < > = values in this interval not displayed.    Basic Metabolic Panel: Recent Labs  Lab 03/11/24 0001 03/12/24 0427 03/13/24 0442 03/13/24 1521 03/14/24 0352 03/15/24 0155  NA 135 136 133*  --  137 137  K 4.3 4.7 5.3* 4.9 5.1 5.3*  CL 109 109 104  --  106 107  CO2 20* 20* 22  --  22 23  GLUCOSE 89 93 89  --  100* 110*  BUN 21* 17 14  --  12 16  CREATININE 1.41* 1.43* 1.39*  --  1.36* 1.42*  CALCIUM  8.2* 8.3* 8.2*  --  8.5* 8.4*  MG  --   --  2.0  --  2.0  --   PHOS  --   --  3.4  --  3.2  --     Liver Function Tests: Recent Labs  Lab 03/11/24 0001  AST 13*  ALT 16  ALKPHOS 80  BILITOT 0.5  PROT 7.7  ALBUMIN  3.0*    CBG: No results for input(s): GLUCAP in the last 168 hours.  Microbiology Studies:   Recent Results (from the past 240 hours)  Culture, blood (Routine X 2) w Reflex to ID Panel     Status: None (Preliminary result)   Collection Time: 03/11/24  5:33 PM   Specimen: BLOOD  Result Value Ref Range Status   Specimen Description   Final    BLOOD SITE NOT SPECIFIED Performed at [facility], [address] [place], [place] [address]    Special Requests   Final    BOTTLES DRAWN AEROBIC AND ANAEROBIC Blood Culture results may not be optimal due to an inadequate volume of blood received in culture bottles Performed at [facility], [address] [place], [place] [address]    Culture   Final    NO GROWTH 3 DAYS Performed at [facility], [address], [place], [place] [address]    Report Status PENDING  Incomplete  Culture, blood  (Routine X 2) w Reflex to ID Panel     Status: None (Preliminary result)   Collection Time: 03/11/24  5:38 PM   Specimen: BLOOD  Result Value Ref Range Status   Specimen Description   Final    BLOOD SITE NOT SPECIFIED Performed at [facility], [address] [place], [place] [address]    Special Requests   Final    BOTTLES DRAWN AEROBIC AND ANAEROBIC Blood Culture results may not be optimal due to an inadequate volume of blood received in culture bottles Performed at [facility], [address], [place], [place] [address]    Culture  Final    NO GROWTH 3 DAYS Performed at [facility], [address] [place], [place] [address]    Report Status PENDING  Incomplete  Aerobic/Anaerobic Culture w Gram Stain (surgical/deep wound)     Status: None (Preliminary result)   Collection Time: 03/14/24  3:24 PM   Specimen: Sacral; Abscess  Result Value Ref Range Status   Specimen Description   Final    SACRAL Performed at [facility], [address], [place], [place] [address]    Special Requests   Final    NONE Performed at [facility], [address], [place], [place] [address]    Gram Stain   Final    MODERATE WBC PRESENT,BOTH PMN AND MONONUCLEAR NO ORGANISMS SEEN Performed at [facility], [address] [place], [place] [address]    Culture PENDING  Incomplete   Report Status PENDING  Incomplete    Radiology Studies:  CT GUIDED NEEDLE PLACEMENT Result Date: 03/14/2024 INDICATION: Presacral and medial gluteal areas small air collections concerning for abscess. EXAM: CT ASPIRATION OF BILATERAL MEDIAL GLUTEAL INTRAMUSCULAR AIR COLLECTIONS. MEDICATIONS: The patient is currently admitted to the hospital and receiving intravenous antibiotics. The antibiotics were administered within an appropriate time frame prior to the initiation of the procedure. ANESTHESIA/SEDATION: Moderate (conscious) sedation was  employed during this procedure. A total of Versed  2.0 mg and Fentanyl  150 mcg was administered intravenously by the radiology nurse. Total intra-service moderate Sedation Time: 12 minutes. The patient's level of consciousness and vital signs were monitored continuously by radiology nursing throughout the procedure under my direct supervision. COMPLICATIONS: None immediate. PROCEDURE: Informed written consent was obtained from the patient after a thorough discussion of the procedural risks, benefits and alternatives. All questions were addressed. Maximal Sterile Barrier Technique was utilized including caps, mask, sterile gowns, sterile gloves, sterile drape, hand hygiene and skin antiseptic. A timeout was performed prior to the initiation of the procedure. Previous imaging reviewed. Patient position prone. Noncontrast localization CT performed. The medial gluteal air collections were localized and marked bilaterally. Left medial gluteal aspiration: Under sterile conditions and local anesthesia, the 18 gauge trocar needle was advanced from a medial gluteal oblique approach into the air collection. Scant blood-tinged fluid aspirated. Sample sent for culture. Right medial gluteal aspiration: In a similar fashion, an 18 gauge trocar needle was advanced into the medial gluteal air collection on the right side. Needle position confirmed with CT. Syringe aspiration did not yield any fluid. Needle removed. IMPRESSION: Successful CT-guided aspirations of the medial gluteal air collections bilaterally yielding only scant fluid from the left side as above. Electronically Signed   By: CHRISTELLA.  Shick M.D.   On: 03/14/2024 16:41   US  EKG SITE RITE Result Date: 03/13/2024 If [facility] image not attached, placement could not be confirmed due to current cardiac rhythm.   Scheduled Meds:    acetaminophen   1,000 mg Oral TID   Chlorhexidine  Gluconate Cloth  6 each Topical Daily   enoxaparin  (LOVENOX ) injection  40 mg Subcutaneous  Q24H   polyethylene glycol  34 g Oral Daily   sodium chloride  flush  10-40 mL Intracatheter Q12H   sodium zirconium cyclosilicate   10 g Oral BID    Continuous Infusions:    piperacillin -tazobactam (ZOSYN )  IV       LOS: 4 days     Trenda Mar, MD,  FACP, [facility], [facility], [facility]   [facility]  To contact the attending provider between 7A-7P or the covering provider during after hours 7P-7A, please log into the web site [email or web address] and access using universal [place] password for that web site. If you do not have the password, please call the hospital operator.  03/15/2024, 10:03 AM

## 2024-03-15 NOTE — Progress Notes (Signed)
 Pt c/o itching and hives to right arm, few bumps noticed on patients arm, MD notified and aware that pts skin was dry and itching at initial assessment, pt stated he thought it was dilaudid  yesterday and that he told the primary team yesterday but now he states he thinks its been going for a few months prior. Unclear of when the itching started but patient is stating its from the medications he is taking. 1 x prn benadryl  given and itching has not increased or worsened it has stayed the same. Pt stable and resting in bed with eyes open, RR even and labored. Pt in no acute distress.

## 2024-03-15 NOTE — Plan of Care (Signed)

## 2024-03-15 NOTE — Telephone Encounter (Signed)
-----   Message from Glendia FORBES Holt sent at 03/15/2024  3:54 PM EDT ----- Regarding: Hospital follow up NM,  Alvester is likely getting discharged this afternoon.  ID recommended 6 weeks pip/tazo.  He has a PICC.  ID ok with continuing Stelara  while on antibiotics (next dose due Aug 13). Dr. Sheldon saw him, not entirely convinced he has osteo or new infections, queries whether this is all Crohn's.  Not sure how enthusiastic he was about diversion from his note, but would request colonoscopy prior to any surgery.  Patient still not considering diversion at this point.  Thanks for your advice on him.  Team,  Please book outpatient follow up with Dr. Suzann, preferrably within a month.  Thanks,  SEC

## 2024-03-15 NOTE — TOC Progression Note (Signed)
 Transition of Care [facility]) - Progression Note    Patient Details  Name: Shawn [name] MRN: [id number] Date of Birth: 01/25/1986  Transition of Care [facility] CM/SW Contact  Alfonse JONELLE Rex, RN Phone Number: 03/15/2024, 4:19 PM  Clinical Narrative:  Text received from [facility] w/[employer] , requesting attending contact information for signature on insurance authorization forms , NCM provided contact number for attending, Dr Judeth, [phone].  Teams chat received from bedside nurse, states dc delayed until tomorrow for monitoring for possible reaction to abx. Pam with [employer] notified.     Expected Discharge Plan: Home w Home Health Services                 Expected Discharge Plan and Services       Living arrangements for the past 2 months: Single Family Home                                       Social Drivers of Health (SDOH) Interventions SDOH Screenings   Food Insecurity: Food Insecurity Present (03/11/2024)  Housing: High Risk (03/11/2024)  Transportation Needs: No Transportation Needs (03/11/2024)  Utilities: At Risk (03/11/2024)  Alcohol Screen: Low Risk  (09/18/2023)  Depression (PHQ2-9): Low Risk  (02/14/2024)  Recent Concern: Depression (PHQ2-9) - High Risk (12/14/2023)  Financial Resource Strain: Low Risk  (09/18/2023)  Physical Activity: Sufficiently Active (09/18/2023)  Social Connections: Socially Isolated (03/11/2024)  Stress: Stress Concern Present (09/18/2023)  Tobacco Use: Medium Risk (03/10/2024)    Readmission Risk Interventions    03/12/2024    9:25 AM  Readmission Risk Prevention Plan  Post Dischage Appt Complete  Medication Screening Complete  Transportation Screening Complete

## 2024-03-15 NOTE — Progress Notes (Signed)
 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 And further more by different scale value (some HS-crp, some regular crp)   Previous culture: 06/2021 ecoli (r amp-sulb; S cipro /flagyl ); not esbl), e faecalis At some point 08/2023 outside ir aspirate culture negative? No hx esbl or pseudomonas     This admission --> His imaging ct doesn't suggest new abscess/new cortical destruction. He is not septic. I am unclear how a Crohn's flare in his case would present (which could contribute to sx mentioned without other evidence of abscess/osteomyelitis     Initial formulation: Given immunosuppression, and multiple prior abx he has high likelihood of MDR organisms and I have higher threshold to start empiric abx. Would rather have good evidence of om and would plan for biopsy to guide therapy   I would also like to see at some point the seton drain be removed as it could be a risk for difficult to treat nonfermenter gram negative rod (pseudomonas, acinetobacter for example). Obviously he'll need his fistula disease resolved prior to removal. He hasn't seen  general surgery since 08/2023     I would check blood cx and pelvis mri --> if both negative would ask GI to comment on crohn's process and at the most I would repeat evaluation to see if we need to pull trigger on dedicated OM diagnosis/treatment     ------------------ 03/12/24 id assessment Pelvis mri done and showed presacral abscess and mri sacral bone edema ?early OM Surgery evaluated patient and ongoing discussion regarding management  7/20 blood cx ngtd Afebrile Mild leukocytosis stable   --------------- 03/15/24 id assessment Surgery reviewed and no other offer to do outside of fecal diversion which patient at this time refused; extensive coccyx resection previously done. No plan for flex sig at this time with GI.   Posed question if crohn's disease is not controlled under [facility] --> outpatient GI to review  Imaging wise fairly similar process from 08/2023  Unclear role of antibiotics in this fistulizing process  Ir sampling 7/23 and will start empiric piptazo for another 6 weeks.   Reassess imaging/sx near end of abx and if no improvement and no abx appropriate will be a hard case to review management options with surgery/id/gi at that time   -opat for piptazo as below; closed id clinic f/u to adjust abx as needed -appreciate surgery/GI input -maintain standard isolation precaution -discussed with primary team  OPAT Orders Discharge antibiotics to be given via PICC line Discharge antibiotics: Piptazo 13.5 gram continuous daily infusion  Duration: 6 weeks from 7/23 End Date: 04/25/24  [facility] Care Per Protocol:  Home health RN for IV administration and teaching; PICC line care and labs.    Labs weekly while on IV antibiotics: _x_ CBC with differential __ BMP _x_ CMP _x_ CRP _x_ ESR __ Vancomycin  trough __ CK  _x_ Please pull PIC at completion of IV antibiotics __ Please leave PIC in place until doctor has seen patient or been notified  Fax weekly  labs to [phone]  Clinic Follow Up Appt: 8/04 @ 2pm  @  [facility] [address], [place], [place] [address] Phone: [phone]    Principal Problem:   Rectal pain Active Problems:   Abscess of male pelvis (HCC)   Anal fistula   B12 deficiency   Crohn's disease with abscess (HCC)   Osteomyelitis (HCC)   Allergies  Allergen Reactions   Shrimp [Shellfish Allergy] Shortness Of Breath   Nsaids Other (See Comments)    CROHN'S DISEASE = NO NSAIDS    Scheduled Meds:  acetaminophen   1,000 mg Oral TID   Chlorhexidine  Gluconate Cloth  6 each Topical Daily   enoxaparin  (LOVENOX ) injection  40 mg Subcutaneous Q24H   polyethylene glycol  34 g Oral Daily   sodium chloride  flush  10-40 mL Intracatheter Q12H   sodium zirconium cyclosilicate   10 g Oral BID   Continuous Infusions:  piperacillin -tazobactam (ZOSYN )  IV 3.375 g (03/15/24 1004)   PRN Meds:.albuterol , HYDROmorphone  (DILAUDID ) injection, ondansetron  **OR** ondansetron  (ZOFRAN ) IV, oxyCODONE , sodium chloride  flush   SUBJECTIVE: Ir aspirate 7/23 and cx pending No drain left -- only scant amount bloody fluid obtained   Gi to follow oupatient No fecal diversion per patient's wish and surgery nothing else to offer per their evaluation  Still moderate-severe perirectal pain  Abx started 7/23  Afebrile Reactive thrombocytosis   Review of Systems: ROS All other ROS was negative, except mentioned above     OBJECTIVE: Vitals:   03/14/24 2114 03/15/24 0144 03/15/24 0601 03/15/24 0850  BP: 120/80 92/69 122/86 111/64  Pulse: 72 64 69 (!) 57  Resp: 18 17 18 18   Temp: 98.4 F (36.9 C) 97.9 F (36.6 C) 98.6 F (37 C) 98.4 F (36.9 C)  TempSrc: Oral Oral  Oral  SpO2: 100% 98% 100% 98%  Weight:      Height:       Body mass index is 23.53 kg/m.  Physical Exam General/constitutional: no distress, pleasant HEENT: Normocephalic, PER, Conj Clear, EOMI, Oropharynx clear Neck supple CV: rrr no  mrg Lungs: clear to auscultation, normal respiratory effort Abd: Soft, Nontender Ext: no edema Skin: No Rash Neuro: nonfocal MSK: no peripheral joint swelling/tenderness/warmth; back spines nontender    Lab Results Lab Results  Component Value Date   WBC 8.7 03/15/2024   HGB 8.9 (L) 03/15/2024   HCT 28.5 (L) 03/15/2024   MCV 86.4 03/15/2024   PLT 405 (H) 03/15/2024    Lab Results  Component Value Date   CREATININE 1.42 (H) 03/15/2024   BUN 16 03/15/2024   NA 137 03/15/2024   K 5.3 (H) 03/15/2024   CL 107 03/15/2024   CO2 23 03/15/2024    Lab Results  Component Value Date   ALT 16 03/11/2024   AST 13 (L) 03/11/2024   ALKPHOS 80 03/11/2024   BILITOT 0.5 03/11/2024      Microbiology: Recent  Results (from the past 240 hours)  Culture, blood (Routine X 2) w Reflex to ID Panel     Status: None (Preliminary result)   Collection Time: 03/11/24  5:33 PM   Specimen: BLOOD  Result Value Ref Range Status   Specimen Description   Final    BLOOD SITE NOT SPECIFIED Performed at [facility], [address], [place], [place] [address]    Special Requests   Final    BOTTLES DRAWN AEROBIC AND ANAEROBIC Blood Culture results may not be optimal due to an inadequate volume of blood received in culture bottles Performed at [facility], [address] [place], [place] [address]    Culture   Final    NO GROWTH 3 DAYS Performed at [facility], [address], [place], [place] [address]    Report Status PENDING  Incomplete  Culture, blood (Routine X 2) w Reflex to ID Panel     Status: None (Preliminary result)   Collection Time: 03/11/24  5:38 PM   Specimen: BLOOD  Result Value Ref Range Status   Specimen Description   Final    BLOOD SITE NOT SPECIFIED Performed at [facility], [address] [place], [place] [address]    Special Requests   Final    BOTTLES DRAWN AEROBIC AND ANAEROBIC Blood Culture results may not be optimal due to an  inadequate volume of blood received in culture bottles Performed at [facility], [address], [place], [place] [address]    Culture   Final    NO GROWTH 3 DAYS Performed at [facility], [address], [place], [place] [address]    Report Status PENDING  Incomplete  Aerobic/Anaerobic Culture w Gram Stain (surgical/deep wound)     Status: None (Preliminary result)   Collection Time: 03/14/24  3:24 PM   Specimen: Sacral; Abscess  Result Value Ref Range Status   Specimen Description   Final    SACRAL Performed at [facility], [address] [place], [place] [address]    Special Requests   Final    NONE Performed at [facility], [address], [place], [place] [address]    Gram Stain   Final    MODERATE WBC PRESENT,BOTH PMN AND MONONUCLEAR NO ORGANISMS SEEN Performed at [facility], [address] [place], [place] [address]    Culture PENDING  Incomplete   Report Status PENDING  Incomplete     Serology:   Imaging: If present, new imagings (plain films, ct scans, and mri) have been personally visualized and interpreted; radiology reports have been reviewed. Decision making incorporated into the Impression / Recommendations.  7/21 mri pelvis FINDINGS: OSSEOUS STRUCTURES AND JOINTS:   Coccygeal osteomyelitis with erosion of the coccyx noted. Low-grade marrow edema in S2 through S5 segments of the sacrum, probably from early osteomyelitis. No SI joint effusion or hip joint effusion.   MUSCULOTENDINOUS:   Presacral abscesses are observed with abnormal enhancement extending into the anterior S2, S3, and S4 foramina. The main portion of the conglomerate abscess measures about 6.7 by 1.1 by 4.4 cm (volume = 17 cm^3) although this abscess may be multiloculated. Small abscesses are also present along the anterior margin of the left gluteus maximus muscle, again potentially loculated but  measuring about 6.0 by 1.5 by 2.3 cm as shown on image 30 series 10. Local myositis noted in the bilateral piriformis musculature (especially medially)  and in the medial gluteus maximus musculature.   Mild edema and enhancement in the lower posterior paraspinal musculature (erector spinae and longissimus thoracis muscles) bilaterally could reflect early myositis.   SACRAL PLEXUS:   Abscess noted extending along particularly the S3 and S4 components the sacral plexus. Accentuated enhancement of the sciatic nerves, left greater than right, suspicious for sciatic neuropathy/inflammation.   OTHER:   On image 23 series 11 there appears to be a seton in the posterior perianal space.   Scattered perirectal lymph nodes are likely inflammatory.   Today's exam was not optimized specifically look at the anal canal or for perianal fistula, the orthopedic pelvis protocol was utilized.   IMPRESSION: 1. 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 2. Low-grade marrow edema in S2 through S5 segments of the sacrum, probably from early osteomyelitis. 3. Presacral abscesses with abnormal enhancement extending into the anterior S2, S3, and S4 foramina. The main portion of the conglomerate abscess measures about 6.7 by 1.1 by 4.4 cm (volume = 17 cm^ 3) although this abscess may be multiloculated. Small abscesses are also present along the anterior margin of the left gluteus maximus muscle, again potentially loculated but measuring about 6.0 by 1.5 by 2.3 cm. 4. Local myositis in the bilateral piriformis musculature (especially medially) and in the medial gluteus maximus musculature. 5. Mild edema and enhancement in the lower posterior paraspinal musculature (erector spinae and longissimus thoracis muscles) bilaterally could reflect early myositis. 6. Accentuated enhancement of the sciatic nerves, left greater than right, suspicious for sciatic neuropathy/inflammation. 7. There appears  to be a seton in the posterior perianal space. Scattered perirectal lymph nodes are likely inflammatory. Today's exam was not optimized specifically look at the anal canal or for perianal fistula, the orthopedic pelvis protocol was utilized.     7/20 ct abd pelv IMPRESSION: 1. 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 2. Stable fluid and soft tissue gas in the presacral region and along the medial left gluteal region/ischiorectal fossa. No new abscess. 3. No cortical destruction to suggest sacral osteomyelitis.  Constance ONEIDA Passer, MD [facility] [phone] pager    03/15/2024, 12:47 PM

## 2024-03-15 NOTE — Discharge Instructions (Signed)
 Additional Discharge Instructions   Please get your medications reviewed and adjusted by your Primary MD.  Please request your Primary MD to go over all Hospital Tests and Procedure/Radiological results at the follow up, please get all Hospital records sent to your Primary MD by signing hospital release before you go home.  If you had Pneumonia of Lung problems at the Hospital: Please get a 2 view Chest X ray done in approximately 4 weeks after hospital discharge or sooner if instructed by your Primary MD.  If you have Congestive Heart Failure: Please call your Cardiologist or Primary MD anytime you have any of the following symptoms:  1) 3 pound weight gain in 24 hours or 5 pounds in 1 week  2) shortness of breath, with or without a dry hacking cough  3) swelling in the hands, feet or stomach  4) if you have to sleep on extra pillows at night in order to breathe  Follow cardiac low salt diet and 1.5 lit/day fluid restriction.  If you have diabetes Accuchecks 4 times/day, Once in AM empty stomach and then before each meal. Log in all results and show them to your primary doctor at your next visit. If any glucose reading is under 80 or above 300 call your primary MD immediately.  If you have Seizure/Convulsions/Epilepsy: Please do not drive, operate heavy machinery, participate in activities at heights or participate in high speed sports until you have seen by Primary MD or a Neurologist and advised to do so again. Per [place]  [place] statutes, patients with seizures are not allowed to drive until they have been seizure-free for six months.  Use caution when using heavy equipment or power tools. Avoid working on ladders or at heights. Take showers instead of baths. Ensure the water temperature is not too high on the home water heater. Do not go swimming alone. Do not lock yourself in a room alone (i.e. bathroom). When caring for infants or small children, sit down when holding, feeding,  or changing them to minimize risk of injury to the child in the event you have a seizure. Maintain good sleep hygiene. Avoid alcohol.   If you had Gastrointestinal Bleeding: Please ask your Primary MD to check a complete blood count within one week of discharge or at your next visit. Your endoscopic/colonoscopic biopsies that are pending at the time of discharge, will also need to followed by your Primary MD.  Get Medicines reviewed and adjusted. Please take all your medications with you for your next visit with your Primary MD  Please request your Primary MD to go over all hospital tests and procedure/radiological results at the follow up, please ask your Primary MD to get all Hospital records sent to his/her office.  If you experience worsening of your admission symptoms, develop shortness of breath, life threatening emergency, suicidal or homicidal thoughts you must seek medical attention immediately by calling 911 or calling your MD immediately  if symptoms less severe.  You must read complete instructions/literature along with all the possible adverse reactions/side effects for all the Medicines you take and that have been prescribed to you. Take any new Medicines after you have completely understood and accpet all the possible adverse reactions/side effects.   Do not drive or operate heavy machinery when taking Pain medications.   Do not take more than prescribed Pain, Sleep and Anxiety Medications  Special Instructions: If you have smoked or chewed Tobacco  in the last 2 yrs please stop smoking, stop any  regular Alcohol  and or any Recreational drug use.  Wear Seat belts while driving.  Please note You were cared for by a hospitalist during your hospital stay. If you have any questions about your discharge medications or the care you received while you were in the hospital after you are discharged, you can call the unit and asked to speak with the hospitalist on call if the hospitalist  that took care of you is not available. Once you are discharged, your primary care physician will handle any further medical issues. Please note that NO REFILLS for any discharge medications will be authorized once you are discharged, as it is imperative that you return to your primary care physician (or establish a relationship with a primary care physician if you do not have one) for your aftercare needs so that they can reassess your need for medications and monitor your lab values.  You can reach the hospitalist office at phone [phone] or fax [phone]   If you do not have a primary care physician, you can call [phone] for a physician referral.   Foods to avoid due to your high potassium levels.   Food Category Examples of Foods to Avoid or Limit Rationale/Notes References  Processed foods (with potassium additives) Canned soups, processed meats, deli meats, packaged snacks, potassium-enriched sports drinks Often contain potassium additives, which are highly bioavailable and can significantly increase serum potassium; potassium content may not be obvious on labels; processed foods are a major source of hidden potassium in the diet. [1-6]  Meats (especially large servings) Beef, pork, chicken (large portions) Animal-based foods have higher potassium absorption compared to plant-based sources; large servings can acutely raise serum potassium, especially in patients with impaired renal excretion. [3-7]  Dairy products Milk, yogurt, cheese Dairy contains moderate to high potassium; absorption is efficient; large servings can contribute to hyperkalemia, especially in advanced CKD. [3-5, 7-8]  Fruit juices Orange juice, tomato juice Juices are concentrated sources of potassium and lack fiber, leading to rapid absorption and potential for acute increases in serum potassium. [3-5, 7]  Salt substitutes (potassium chloride -based) No salt or Lite salt products These products often replace sodium  with potassium chloride , providing a highly absorbable form of potassium that can precipitate or worsen hyperkalemia. [3-6]  Certain fruits and vegetables Bananas, oranges, potatoes, tomatoes, avocados, dried fruits These foods are traditionally high in potassium; however, the net bioavailable potassium from plant-based foods is lower than previously thought due to fiber and other factors; acute restriction may be needed in severe hyperkalemia; boiling/soaking can reduce potassium content. [2-9]  Legumes/lentils Beans, lentils High in potassium, but also high in fiber; absorption is less than from animal sources, but large quantities can still contribute to hyperkalemia. [2-5, 7-9]  Chocolate Chocolate, cocoa powder Contains moderate potassium; can contribute to total intake, especially in large amounts or in combination with other high-potassium foods. [5, 8]

## 2024-03-16 ENCOUNTER — Ambulatory Visit

## 2024-03-16 DIAGNOSIS — K5901 Slow transit constipation: Secondary | ICD-10-CM

## 2024-03-16 DIAGNOSIS — M869 Osteomyelitis, unspecified: Secondary | ICD-10-CM | POA: Diagnosis not present

## 2024-03-16 DIAGNOSIS — M8660 Other chronic osteomyelitis, unspecified site: Secondary | ICD-10-CM | POA: Diagnosis not present

## 2024-03-16 DIAGNOSIS — K50914 Crohn's disease, unspecified, with abscess: Secondary | ICD-10-CM | POA: Diagnosis not present

## 2024-03-16 LAB — CULTURE, BLOOD (ROUTINE X 2)
Culture: NO GROWTH
Culture: NO GROWTH

## 2024-03-16 LAB — BASIC METABOLIC PANEL WITH GFR
Anion gap: 6 (ref 5–15)
BUN: 21 mg/dL — ABNORMAL HIGH (ref 6–20)
CO2: 24 mmol/L (ref 22–32)
Calcium: 8.5 mg/dL — ABNORMAL LOW (ref 8.9–10.3)
Chloride: 110 mmol/L (ref 98–111)
Creatinine, Ser: 1.62 mg/dL — ABNORMAL HIGH (ref 0.61–1.24)
GFR, Estimated: 55 mL/min — ABNORMAL LOW (ref >60)
Glucose, Bld: 109 mg/dL — ABNORMAL HIGH (ref 70–99)
Potassium: 5 mmol/L (ref 3.5–5.1)
Sodium: 140 mmol/L (ref 135–145)

## 2024-03-16 MED ORDER — DULCOLAX 5 MG PO TBEC: 10.0000 mg | DELAYED_RELEASE_TABLET | Freq: Every day | ORAL | 0 refills | Status: DC | PRN

## 2024-03-16 MED ORDER — BISACODYL 10 MG RE SUPP
10.0000 mg | Freq: Once | RECTAL | Status: AC
  Administered 2024-03-16: 10 mg via RECTAL
  Filled 2024-03-16: qty 1

## 2024-03-16 MED ORDER — SENNOSIDES-DOCUSATE SODIUM 8.6-50 MG PO TABS
2.0000 | ORAL_TABLET | Freq: Two times a day (BID) | ORAL | Status: DC
  Administered 2024-03-16: 2 via ORAL
  Filled 2024-03-16: qty 2

## 2024-03-16 MED ORDER — HEPARIN SOD (PORK) LOCK FLUSH 100 UNIT/ML IV SOLN
250.0000 [IU] | INTRAVENOUS | Status: AC | PRN
  Administered 2024-03-16: 250 [IU]
  Filled 2024-03-16: qty 3

## 2024-03-16 MED ORDER — POLYETHYLENE GLYCOL 3350 17 G PO PACK: 17.0000 g | PACK | Freq: Three times a day (TID) | ORAL | 1 refills | Status: DC

## 2024-03-16 MED ORDER — SENNOSIDES-DOCUSATE SODIUM 8.6-50 MG PO TABS: 2.0000 | ORAL_TABLET | Freq: Two times a day (BID) | ORAL | 0 refills | Status: AC

## 2024-03-16 NOTE — Progress Notes (Signed)
 Reviewed written d/c instructions w pt and all questions answered, he verbalized understanding. D/C ambulatory w all belongings in stable condition.

## 2024-03-16 NOTE — Telephone Encounter (Signed)
 Patient was not discharged yesterday, currently still admitted. Will contact patient next week to schedule follow up

## 2024-03-16 NOTE — Progress Notes (Signed)
 Progress Note  Patient was supposed to discharge yesterday but this had to be held due to patient complaining of some itchy rash on his right upper extremity.  Patient interviewed and examined this morning.  Addendum made to DC summary from yesterday.  Constipation without obstructive symptoms.  Failed Dulcolax suppository trial here.  Patient declined an enema.  Adjusted/uptitrated BM regimen at discharge  Hyperkalemia improved but at risk for recurrence.  Etiology unclear.  Low potassium diet advised and included in DC instructions  Creatinine mildly elevated.  Patient advised to increased p.o. fluid intake especially during this hot weather.  Close outpatient follow-up with PCP next week with repeat BMP.  Itching of right upper extremity, localized, low index of suspicion for generalized allergic reaction.  Improved compared to yesterday.  Sent new prescriptions to his outpatient pharmacy.  Called the pharmacist to update changes to his prescriptions called in yesterday.  Discharging home today.  Trenda Mar, MD,  FACP, [facility], [facility], [facility]   [facility] [place]     To contact the attending provider between 7A-7P or the covering provider during after hours 7P-7A, please log into the web site [email or web address] and access using universal [employer] password for that web site. If you do not have the password, please call the hospital operator.

## 2024-03-16 NOTE — Plan of Care (Signed)
  Problem: Education: Goal: Knowledge of General Education information will improve Description: Including pain rating scale, medication(s)/side effects and non-pharmacologic comfort measures Outcome: Adequate for Discharge   Problem: Health Behavior/Discharge Planning: Goal: Ability to manage health-related needs will improve Outcome: Adequate for Discharge   Problem: Clinical Measurements: Goal: Ability to maintain clinical measurements within normal limits will improve Outcome: Adequate for Discharge Goal: Will remain free from infection Outcome: Adequate for Discharge Goal: Diagnostic test results will improve Outcome: Adequate for Discharge Goal: Respiratory complications will improve Outcome: Adequate for Discharge Goal: Cardiovascular complication will be avoided Outcome: Adequate for Discharge   Problem: Activity: Goal: Risk for activity intolerance will decrease Outcome: Adequate for Discharge   Problem: Coping: Goal: Level of anxiety will decrease Outcome: Adequate for Discharge   Problem: Elimination: Goal: Will not experience complications related to bowel motility Outcome: Adequate for Discharge Goal: Will not experience complications related to urinary retention Outcome: Adequate for Discharge   Problem: Pain Managment: Goal: General experience of comfort will improve and/or be controlled Outcome: Adequate for Discharge   Problem: Safety: Goal: Ability to remain free from injury will improve Outcome: Adequate for Discharge   Problem: Skin Integrity: Goal: Risk for impaired skin integrity will decrease Outcome: Adequate for Discharge

## 2024-03-16 NOTE — Plan of Care (Signed)
  Problem: Education: Goal: Knowledge of General Education information will improve Description: Including pain rating scale, medication(s)/side effects and non-pharmacologic comfort measures Outcome: Progressing   Problem: Health Behavior/Discharge Planning: Goal: Ability to manage health-related needs will improve Outcome: Progressing   Problem: Elimination: Goal: Will not experience complications related to bowel motility Outcome: Progressing   Problem: Safety: Goal: Ability to remain free from injury will improve Outcome: Progressing   Problem: Skin Integrity: Goal: Risk for impaired skin integrity will decrease Outcome: Progressing   

## 2024-03-19 ENCOUNTER — Encounter (HOSPITAL_BASED_OUTPATIENT_CLINIC_OR_DEPARTMENT_OTHER): Payer: Self-pay

## 2024-03-19 NOTE — Telephone Encounter (Signed)
 Called and spoke with patient. Patient has been scheduled for a follow up with Dr. Suzann on Friday, 03/30/24 at 3:40 pm. Patient had no concerns at the end of the call.

## 2024-03-20 DIAGNOSIS — M869 Osteomyelitis, unspecified: Secondary | ICD-10-CM | POA: Diagnosis not present

## 2024-03-20 DIAGNOSIS — K50914 Crohn's disease, unspecified, with abscess: Secondary | ICD-10-CM | POA: Diagnosis not present

## 2024-03-20 LAB — AEROBIC/ANAEROBIC CULTURE W GRAM STAIN (SURGICAL/DEEP WOUND): Culture: NO GROWTH

## 2024-03-23 ENCOUNTER — Encounter (HOSPITAL_BASED_OUTPATIENT_CLINIC_OR_DEPARTMENT_OTHER): Payer: Self-pay | Admitting: *Deleted

## 2024-03-23 ENCOUNTER — Inpatient Hospital Stay

## 2024-03-23 DIAGNOSIS — K50914 Crohn's disease, unspecified, with abscess: Secondary | ICD-10-CM | POA: Diagnosis not present

## 2024-03-23 DIAGNOSIS — M869 Osteomyelitis, unspecified: Secondary | ICD-10-CM | POA: Diagnosis not present

## 2024-03-26 ENCOUNTER — Ambulatory Visit: Payer: Self-pay | Admitting: Family

## 2024-03-27 ENCOUNTER — Other Ambulatory Visit: Payer: Self-pay

## 2024-03-28 ENCOUNTER — Inpatient Hospital Stay (HOSPITAL_BASED_OUTPATIENT_CLINIC_OR_DEPARTMENT_OTHER): Admitting: Family Medicine

## 2024-03-29 ENCOUNTER — Other Ambulatory Visit: Payer: Self-pay

## 2024-03-29 NOTE — Progress Notes (Deleted)
 [facility] Return Visit   Referring Provider Caudle, Thersia Bitters, FNP [address] [place],  [place] [address]  Primary Care Provider Caudle, Thersia Bitters, FNP  Patient Profile: Shawn [name] is a 38 y.o. male with a past medical history noteworthy for dilated cardiomyopathy, HTN, anemia, arthritis who returns to the [facility] for follow-up of the problem(s) noted below.  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  History of Present Illness      -- Completed antibiotics with infectious disease for sacral osteomyelitis:  IV daptomycin , cefepime  and metronidazole ; cefadroxil  and metronidazole  x 1 month  -- Stelara  re-induction infusion 520 mg administered 11/22/2023 -- States he took his first maintenance dose in early May 2025 at ?  5-week interval  -- Reports that his perianal discomfort/back pain is static-no better or worse -- Feels that there has been a decrease in drainage from his seton  -- Having 1 formed bowel movement a day without blood or mucus -- No longer needing to use laxatives -- Has mild discomfort with  defecation but no severe pain -- No abdominal pain or cramping  -- No extraintestinal manifestations of IBD-fevers, chills, joint pain, skin rashes oral ulcers -- Denies any other intercurrent infectious illnesses since initiating Stelara  treatment  -- Patient states that he has an ID appointment in the future but I do not see one scheduled -will remind him to schedule this to determine timing of follow-up imaging  Last colonoscopy:  03/2023 - colonoscopy showed diminished rectal inflammation now moderate in severity - Last endoscopy: None  Last Abd CT/CTE/MRE:  CTAP 10/25/2023 - improvement in multiple perianal sinus tracts and presacral fluid/small abscesses  GI Review of Symptoms Significant for rectal pain. Otherwise negative.  General Review of Systems  Review of systems is significant for the pertinent positives and negatives as listed per the HPI.  Full ROS is otherwise negative.  Inflammatory Bowel Disease History  05/2020-coccygeal pain, CT imaging demonstrating perirectal stranding 10/2020-right groin abscess ?  Diagnosis of hidradenitis suppurativa 11/2020-rectal pain, IDA and CT showing rectal wall stranding 12/2020-colonoscopy demonstrating rectal inflammation in the distal, mid and proximal rectum; remainder of colon normal-pathology consistent with chronic colitis with focal activity in the rectum 03/2021 - EUA with incision and drainage of left perianal abscess; diagnosed with coccygeal osteomyelitis around this time 06/2021 - modified Hanley procedure and coccygectomy 07/2021 - completed antibiotic treatment with cefdinir  metronidazole ; induction infliximab  5 mg/kg with maintenance every 8 weeks 12/2021 - recurrent perianal fistula and abscess 03/2022 - therapeutic drug monitoring demonstrated nil level less than 0.4, high antibodies 1358 08/2022 - induction Stelara  followed by maintenance 90 mg subcutaneously every 8 weeks 03/2023 - reports improved symptoms; colonoscopy  showed diminished rectal inflammation now moderate in  severity --> Pt ceased Stelara  of own volition 08/2022 - hospitalized [facility] with recurrent perianal abscesses and concern for sacral osteomyelitis --> treated with daptomycin , cefepime  and metronidazole  followed by 1 month of cefadroxil  and metronidazole  11/2023 -Stelara  reinduction  IBD Medication History Infliximab  -reported ineffective (? Dosing) Stelara  -induction 08/2022 with preliminary response but patient stopped medication prematurely; reinduction 11/2023  Past Medical History   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   PO B12 initiated 12/31/20   Anxiety    Arthritis    Chronic kidney disease    Crohn's colitis (HCC)    Depression    Dilated cardiomyopathy (HCC) 05/18/2013   By catheterization 2014    Fistula    GERD (gastroesophageal reflux disease)    Hypertension    Neuromuscular disorder (HCC)    Osteomyelitis (HCC)    tailbone   Pneumonia      Past Surgical History   Past Surgical History:  Procedure Laterality Date   BIOPSY  01/01/2021   Procedure: BIOPSY;  Surgeon: Aneita Gwendlyn DASEN, MD;  Location: [facility] ENDOSCOPY;  Service: Endoscopy;;   COCCYGECTOMY  07/07/2021   COLONOSCOPY WITH PROPOFOL  N/A 01/01/2021   Procedure: COLONOSCOPY WITH PROPOFOL ;  Surgeon: Aneita Gwendlyn DASEN, MD;  Location: [facility] ENDOSCOPY;  Service: Endoscopy;  Laterality: N/A;   INCISION AND DRAINAGE ABSCESS N/A 07/07/2021   Procedure: MODIFIED HANLEY PROCEDURE, DRAINAGE WITH SETON PLACEMENT;  Surgeon: Shawn Standing, MD;  Location: [facility] ORS;  Service: General;  Laterality: N/A;   INCISION AND DRAINAGE ABSCESS  05/04/2017   right groin   INCISION AND DRAINAGE PERIRECTAL ABSCESS  04/21/2021   INCISION AND DRAINAGE PERIRECTAL ABSCESS  05/18/2013   LEFT AND RIGHT HEART CATHETERIZATION WITH CORONARY ANGIOGRAM N/A 11/21/2012    Procedure: LEFT AND RIGHT HEART CATHETERIZATION WITH CORONARY ANGIOGRAM;  Surgeon: Salena GORMAN Negri, MD;  Location: [facility] CATH LAB;  Service: Cardiovascular;  Laterality: N/A;   MANDIBLE FRACTURE SURGERY  08/23/2004   PLACEMENT OF SETON N/A 07/07/2021   Procedure: PLACEMENT OF SETON;  Surgeon: Shawn Standing, MD;  Location: [facility] ORS;  Service: General;  Laterality: N/A;   PLACEMENT OF SETON N/A 01/15/2022   Procedure: PLACEMENT OF SETON;  Surgeon: Shawn Standing, MD;  Location: [facility] ORS;  Service: General;  Laterality: N/A;   RECTAL EXAM UNDER ANESTHESIA N/A 01/15/2022   Procedure: RECTAL EXAM UNDER ANESTHESIA;  Surgeon: Shawn Standing, MD;  Location: [facility]S;  Service: General;  Laterality: N/A;  TRANSRECTAL DRAINAGE REPLACEMENT OF SETON EXCISION OF PERINEAL SINUS TRACT ANORECTAL EXAM UNDER ANESTHESIA     Allergies and Medications   Allergies  Allergen Reactions   Shrimp [Shellfish Allergy] Shortness Of Breath   Nsaids Other (See Comments)    CROHN'S DISEASE = NO NSAIDS   Bactoshield Chg [Chlorhexidine  Gluconate] Itching    No outpatient medications have been marked as taking for the 03/30/24 encounter (Appointment) with [name] HERO, MD.     Family History   Family History  Problem Relation Age of Onset   Diabetes Mother    Kidney failure Mother    Diabetes Sister    Colon cancer Neg Hx    Stomach cancer Neg Hx    Esophageal cancer Neg Hx    Pancreatic disease Neg Hx    Colon polyps Neg Hx    Rectal cancer Neg Hx     Social History   Social History  Tobacco Use   Smoking status: Former    Current packs/day: 0.25    Types: Cigarettes   Smokeless tobacco: Never  Vaping Use   Vaping status: Never Used  Substance Use Topics   Alcohol use: Not Currently    Comment: occ   Drug use: No   Shawn [name] reports that he has quit smoking. His smoking use included cigarettes. He has never used smokeless tobacco. He reports that he does not currently use alcohol. He reports that he  does not use drugs.  Vital Signs and Physical Examination   Vitals:   01/18/24 1007  BP: 118/62  Pulse: 68   Body mass index is 23.35 kg/m. Weight: 177 lb (80.3 kg)  General: Well developed, well nourished, no acute distress Head: Normocephalic and atraumatic Eyes: Sclerae anicteric, EOMI Lungs: Clear throughout to auscultation Heart: Regular rate and rhythm; No murmurs, rubs or bruits Abdomen: Soft, non tender and non distended. No masses, hepatosplenomegaly or hernias noted. Normal Bowel sounds Rectal: Significant scarring in the perianal region, seton intact posteriorly without drainage, no fluctuance or abscesses Musculoskeletal: Symmetrical with no gross deformities   Review of Data  The following data was reviewed at the time of this encounter:  Laboratory Studies      Latest Ref Rng & Units 03/15/2024    1:55 AM 03/14/2024    3:52 AM 03/13/2024    4:42 AM  CBC  WBC 4.0 - 10.5 K/uL 8.7  9.9  10.4   Hemoglobin 13.0 - 17.0 g/dL 8.9  8.8  8.6   Hematocrit 39.0 - 52.0 % 28.5  27.5  27.1   Platelets 150 - 400 K/uL 405  401  359     Lab Results  Component Value Date   LIPASE 62 (H) 10/25/2023      Latest Ref Rng & Units 03/16/2024    3:29 AM 03/15/2024    4:20 PM 03/15/2024    1:55 AM  CMP  Glucose 70 - 99 mg/dL 890  875  889   BUN 6 - 20 mg/dL 21  19  16    Creatinine 0.61 - 1.24 mg/dL 8.37  8.67  8.57   Sodium 135 - 145 mmol/L 140  140  137   Potassium 3.5 - 5.1 mmol/L 5.0  4.9  5.3   Chloride 98 - 111 mmol/L 110  111  107   CO2 22 - 32 mmol/L 24  22  23    Calcium  8.9 - 10.3 mg/dL 8.5  8.6  8.4    IBD Labs  Prebiologic Labs 08/2023 Hepatitis B surface antibody reactive Hepatitis B core antibody total negative Quantiferon gold negative  Therapeutic Drug Monitoring   Thiopurine metabolite levels:  Date:                6-TGN       6-MMP  Biologic level and antibodies:  Fecal Calprotectin   Imaging Studies  CTAP 3/4/30305 Shawn [name] is again noted in  place. Previously seen fluid collections in the pelvis have decreased in the interval from the prior exam now containing air consistent with continued improvement. No new fluid collection is identified.   Fluid is noted throughout the colon consistent with the diarrheal state.   Mild bibasilar atelectatic changes.  CTAP 10/18/2023 1. Overall interval improvement in the multiple perianal sinus tracts, presacral fluid and small abscesses/collections when compared to the prior exam from 09/23/2023. 2. Otherwise no acute inflammatory process identified within the abdomen or pelvis. 3. There are patchy peripheral  opacities throughout the imaged bilateral lungs concerning for multilobar pneumonia. Follow-up to clearing is recommended. 4. Multiple other nonacute observations, as described above.  CTAP 09/27/2023 1. Persistent left-sided anorectal fistula with gas containing tracts extending into the presacral space and bilateral gluteal musculature. Increased gas and decreased food within the tracts compared with 09/19/2023 likely secondary to aspiration performed 09/20/2023. Otherwise these are grossly similar to 09/19/2023 given differences in contrast timing. 2. Increased, moderate colonic stool burden. Correlate for constipation. No evidence of obstruction.   MRI Pelvis 09/21/2023 1. Enlarging presacral fluid collection with peripheral enhancement suspicious for an abscess. There is asymmetric extension of peripherally enhancing fluid into the posterior aspect of the left greater sciatic notch. 2. Increased marrow T2 hyperintensity and enhancement within the adjacent mid to distal sacrum and coccyx suspicious for early osteomyelitis. 3. Presacral inflammatory changes extend into the adjacent piriformis and gluteus maximus muscles bilaterally with associated small intramuscular fluid collections. 4. No evidence of sacroiliitis or hip joint septic arthritis.  In addition to the  perianal fistula with seton tube in place, there appears to be a fistula extending from the left side of the rectum (image [id number] series 13) at about the 3 o'clock position approximately 5 cm cephalad to the anorectal junction, and extending directly posteriorly into the complex presacral collection. The collection has small abscess like extensions extending along the gluteal musculature. Multiple likely reactive perirectal lymph nodes are present.   There is primarily lower rectal diffuse wall thickening extending down to the anorectal junction and probably into the anus. The upper rectum does not appear substantially thickened.  CTAP 08/22/2023 1. Decreased rectal mucosal thickening with mild residual peri-rectal stranding. 2. Small, LEFT-sided persistent anorectal fistula.  See key image. No focal drainable collection or abscess.    CTAP 8/11/20204 1. No pulmonary embolus. 2. No acute intrathoracic abnormality. 3. No acute intra-abdominal abnormality. 4. Persistent irregular left posterior rectal wall thickening with associated Similar-appearing suprasphincteric left perianal fistula with posterior rectal inflammatory changes and seton in place. Findings are congruent with stable presacral soft tissue fat stranding and gas. No organized fluid collection. Rectal wall thickening likely reactive changes in the setting of an anorectal fistula with however underlying malignancy not excluded. 5. Associated chronic destruction of the coccyx with no new cortical erosion or obstruction. Findings suggestive of chronic osteomyelitis. If concern for acute osteomyelitis, consider MRI.  GI Procedures and Studies  Colonoscopy 04/06/2023 - Abnormal perianal exam fistula with seton in place.  - The examined portion of the ileum was normal. Biopsied. - Crohn's disease with colonic involvement. Inflammation was found from the anus to the rectum. This was moderate in severity, improved compared  to previous examinations. Biopsied.  - The examination was otherwise normal on direct and retroflexion views.   Path 1. Surgical [P], small bowel, ileum ILEAL MUCOSA WITH PRESERVED VILLOGLANDULAR ARCHITECTURE WITHOUT INCREASED INTRAEPITHELIAL LYMPHOCYTES OR EVIDENCE OF ACTIVE INFLAMMATION. NEGATIVE FOR DYSPLASIA OR MALIGNANCY. 2. Surgical [P], random colon sites COLONIC MUCOSA WITH NO SIGNIFICANT DIAGNOSTIC ALTERATION. NO EVIDENCE OF LYMPHOCYTIC COLITIS OR COLLAGENOUS COLITIS. NO EVIDENCE OF ACTIVITY, CHRONICITY, GRANULOMA, DYSPLASIA OR MALIGNANCY. 3. Surgical [P], colon, rectum COLONIC MUCOSA WITH REACTIVE EPITHELIAL CHANGES AND LYMPHOID AGGREGATES. NEGATIVE FOR DYSPLASIA OR MALIGNANCY.   Colonoscopy 01/01/2021 - The examined portion of the ileum was normal.  - Congested, indurated, friable mucosa in the rectum. Biopsied. Path A. RECTUM, BIOPSY:  -  Chronic colitis with focal activity  -  No granulomata, dysplasia or malignancy identified  -  See  comment      Clinical Impression  It is my clinical impression that Shawn [name] is a 38 y.o. 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  In 10/2020 he developed a right groin abscess raising the possibility of hidradenitis suppurativa.  The following month he manifested worsening rectal pain, iron  deficiency anemia and CT scan showing rectal wall stranding.  Colonoscopy 12/2020 demonstrated rectal inflammation in the distal, mid and proximal rectum with the remainder of the rectum appearing normal.  In 03/2021 he  underwent EUA with incision and drainage of a left perianal abscess and was diagnosed with coccygeal osteomyelitis.  He was managed with antibiotics and underwent modified Hanley procedure with coccygectomy 04/02/2021.    After an extended course of treatment with cefdinir  and metronidazole  he began induction infliximab  5 mg/kg every 8 weeks and 07/2021.Records indicate that despite infliximab  treatment Arkel continued to have recurrent perianal fistulas and abscess.  Therapeutic drug monitoring showed a nil infliximab  level and high antibodies.    As such, he was transitioned to Stelara  and received induction infusion 08/2022 followed by maintenance dosing with 90 mg subcutaneously every 8 weeks.  Shawn [name] recalls having a favorable response to Stelara  treatment.  Restaging colonoscopy 03/2023 showed diminished rectal inflammation, now moderate in severity.  Shawn [name] misunderstood instructions and thought because there was improvement in his inflammation he was able to stop Stelara  and cease the medication.  December 2024/January 2025 he presented with recurrent perianal abscesses and presacral fluid collections.  He was ultimately admitted to [facility] due to perianal sepsis and concern for recurrent sacral osteomyelitis.  He has completed an extended course of IV daptomycin , cefepime  and metronidazole  followed by 1 month of cefdinir  and metronidazole .  Most recent cross-sectional imaging 10/25/2023 has shown improvement in perianal abscesses and presacral fluid collection.  In collaboration with infectious disease, it was deemed safe to restart immune suppression with Stelara  in 11/2023.  He underwent reinduction infusion that month.  His maintenance dosing is 90 mg subcutaneously every 8 weeks.  He appears to have administered his first maintenance dose at 5 weeks.  He is now approximately 4 weeks status post that dose.  We discussed performing laboratory studies and therapeutic drug monitoring today.  I  anticipate he will likely need high dose treatment with every 4 week dosing because of his severe perianal fistulizing disease.   Plan  Labs today: CBC, CMP, ESR, CRP, iron  panel, vitamin D , therapeutic drug monitoring Continue Stelara  90 mg subcutaneously -will assess therapeutic drug monitoring to determine dosing frequency-anticipate he will need every 4 week dosing in the future Review option of iron  infusions given ongoing anemia Consider future disease activity monitoring with periodic fecal calprotectin assessments Plan for restaging colonoscopy 6 to 12 months status post resumption of Stelara  Continue multidisciplinary collaboration with infectious disease Continue vitamin B-12 supplement Vitamin D  is low on today's labs-prescribed vitamin D  50,000 international units once a week x 8 weeks, then vitamin D  1000 to 2000 international units daily Monitor weight and anthropometrics   IBD Health Maintenance  Vaccinations Influenza: PCV13: PPSV23: COVID19: HAV/HBV: Shingles: HPV:  DEXA PRN  Eye Exam Annual eye exam  Skin Exam  Annual skin exam  Surveillance Colonoscopy Limited proctitis; does not require intensive colonoscopic surveillance -anticipate restaging at least every 3 years  Tobacco Use   Depression Screen      Planned Follow Up 4 months  The patient or caregiver verbalized understanding of the material covered, with no barriers to understanding. All questions were answered. Patient or caregiver is agreeable with the plan outlined above.    It was a pleasure to see Arg[place].  If you have any questions or concerns regarding this evaluation, do not hesitate to contact me.  Inocente Hausen, MD [facility]

## 2024-03-30 ENCOUNTER — Inpatient Hospital Stay: Attending: Nurse Practitioner

## 2024-03-30 ENCOUNTER — Ambulatory Visit: Admitting: Pediatrics

## 2024-03-30 DIAGNOSIS — M869 Osteomyelitis, unspecified: Secondary | ICD-10-CM | POA: Diagnosis not present

## 2024-03-30 DIAGNOSIS — K50914 Crohn's disease, unspecified, with abscess: Secondary | ICD-10-CM | POA: Diagnosis not present

## 2024-03-31 ENCOUNTER — Encounter (INDEPENDENT_AMBULATORY_CARE_PROVIDER_SITE_OTHER): Payer: Self-pay

## 2024-04-01 ENCOUNTER — Encounter: Payer: Self-pay | Admitting: Pediatrics

## 2024-04-02 ENCOUNTER — Other Ambulatory Visit: Payer: Self-pay

## 2024-04-02 DIAGNOSIS — M869 Osteomyelitis, unspecified: Secondary | ICD-10-CM | POA: Diagnosis not present

## 2024-04-02 DIAGNOSIS — K50914 Crohn's disease, unspecified, with abscess: Secondary | ICD-10-CM | POA: Diagnosis not present

## 2024-04-02 NOTE — Progress Notes (Signed)
 Specialty Pharmacy Refill Coordination Note  Shawn [name] is a 38 y.o. male contacted today regarding refills of specialty medication(s) Ustekinumab  (STELARA )   Patient requested Delivery   Delivery date: 04/04/24   Verified address: [address]   Medication will be filled on 04/03/24.

## 2024-04-04 ENCOUNTER — Telehealth: Payer: Self-pay

## 2024-04-04 DIAGNOSIS — M869 Osteomyelitis, unspecified: Secondary | ICD-10-CM

## 2024-04-04 NOTE — Progress Notes (Unsigned)
 Complex Care Management Note Care Guide Note  04/04/2024 Name: Shawn [name] MRN: [id number] DOB: 11-20-1985   Complex Care Management Outreach Attempts: An unsuccessful telephone outreach was attempted today to offer the patient information about available complex care management services.  Follow Up Plan:  Additional outreach attempts will be made to offer the patient complex care management information and services.   Encounter Outcome:  No Answer  Leotis Rase [facility], [facility] Guide  Direct Dial: [phone]  Fax [phone]

## 2024-04-06 DIAGNOSIS — M869 Osteomyelitis, unspecified: Secondary | ICD-10-CM | POA: Diagnosis not present

## 2024-04-06 DIAGNOSIS — K50914 Crohn's disease, unspecified, with abscess: Secondary | ICD-10-CM | POA: Diagnosis not present

## 2024-04-09 NOTE — Progress Notes (Unsigned)
 Complex Care Management Note Care Guide Note  04/09/2024 Name: Shawn [name] MRN: [id number] DOB: 02/20/1986   Complex Care Management Outreach Attempts: A second unsuccessful outreach was attempted today to offer the patient with information about available complex care management services.  Follow Up Plan:  Additional outreach attempts will be made to offer the patient complex care management information and services.   Encounter Outcome:  No Answer  Leotis Rase [facility], [facility] Guide  Direct Dial: [phone]  Fax [phone]

## 2024-04-10 DIAGNOSIS — K50914 Crohn's disease, unspecified, with abscess: Secondary | ICD-10-CM | POA: Diagnosis not present

## 2024-04-10 DIAGNOSIS — M869 Osteomyelitis, unspecified: Secondary | ICD-10-CM | POA: Diagnosis not present

## 2024-04-10 NOTE — Progress Notes (Signed)
 Complex Care Management Note Care Guide Note  04/10/2024 Name: Shawn [name] MRN: [id number] DOB: 02/09/1986   Complex Care Management Outreach Attempts: A third unsuccessful outreach was attempted today to offer the patient with information about available complex care management services.  Follow Up Plan:  No further outreach attempts will be made at this time. We have been unable to contact the patient to offer or enroll patient in complex care management services.  Encounter Outcome:  No Answer  Leotis Rase [facility], [facility] Guide  Direct Dial: [phone]  Fax [phone]

## 2024-04-13 DIAGNOSIS — M869 Osteomyelitis, unspecified: Secondary | ICD-10-CM | POA: Diagnosis not present

## 2024-04-13 DIAGNOSIS — K50914 Crohn's disease, unspecified, with abscess: Secondary | ICD-10-CM | POA: Diagnosis not present

## 2024-04-16 DIAGNOSIS — K50914 Crohn's disease, unspecified, with abscess: Secondary | ICD-10-CM | POA: Diagnosis not present

## 2024-04-16 DIAGNOSIS — M869 Osteomyelitis, unspecified: Secondary | ICD-10-CM | POA: Diagnosis not present

## 2024-04-20 DIAGNOSIS — K50914 Crohn's disease, unspecified, with abscess: Secondary | ICD-10-CM | POA: Diagnosis not present

## 2024-04-20 DIAGNOSIS — M869 Osteomyelitis, unspecified: Secondary | ICD-10-CM | POA: Diagnosis not present

## 2024-04-24 ENCOUNTER — Ambulatory Visit: Payer: Self-pay

## 2024-04-24 ENCOUNTER — Telehealth: Payer: Self-pay | Admitting: Nurse Practitioner

## 2024-04-24 NOTE — Telephone Encounter (Signed)
 Spoke with [facility] triage nurse Ronnie. Let him know that I did read over the notes and told him that pt did need to go to the  ED to be evaluated. Lyndy said that he did tell pt that but pt had already hung up the phone. Tried to call pt but immediately received a message saying that the call could not be completed as dialed.  Did inform Lyndy if pt called back that he does need to go to ED and Ronnie verbalized  understanding.

## 2024-04-24 NOTE — Telephone Encounter (Signed)
 FYI Only or Action Required?: Action required by provider: request for appointment.  Patient was last seen in primary care on 12/14/2023 by [name], FNP.  Called Nurse Triage reporting No chief complaint on file..  Symptoms began several days ago.  Interventions attempted: Nothing.  Symptoms are: rapidly worsening.  Triage Disposition: Go to ED Now (or PCP Triage)  Patient/caregiver understands and will follow disposition?: No, refuses disposition  Copied from CRM #[id number]. Topic: Clinical - Red Word Triage >> Apr 24, 2024  8:10 AM Antwanette L wrote: Red Word that prompted transfer to Nurse Triage: Possible concussion. The pt fell Friday night and hit his face. The patient is experiencing neck pain and head thriving. Reason for Disposition . Patient sounds very sick or weak to the triager  Answer Assessment - Initial Assessment Questions 1. MECHANISM: How did the fall happen?     Tripped over his own feet  3. ONSET: When did the fall happen? (e.g., minutes, hours, or days ago)     Thursday or Friday  4. LOCATION: What part of the body hit the ground? (e.g., back, buttocks, head, hips, knees, hands, head, stomach)     Face  5. INJURY: Did you hurt (injure) yourself when you fell? If Yes, ask: What did you injure? Tell me more about this? (e.g., body area; type of injury; pain severity)     Yes, Face, Head  6. PAIN: Is there any pain? If Yes, ask: How bad is the pain? (e.g., Scale 0-10; or none, mild,      Yes, Headache  7. SIZE: For cuts, bruises, or swelling, ask: How large is it? (e.g., inches or centimeters)       Bruising  9. OTHER SYMPTOMS: Do you have any other symptoms? (e.g., dizziness, fever, weakness; new-onset or worsening).      Headaches, Dizziness, Vision Changes  10. CAUSE: What do you think caused the fall (or falling)? (e.g., dizzy spell, tripped)       Tripped over his own feet.  Spoke with Nurse Damien regarding  patient's refusal of recommended disposition for the Emergency Room. Informed that she would attempt to reach out to the patient, but if he were to call back that he would definitely need to go to the ER. Reinforced the patient's knowledge of this and adamant refusal, Damien Verbalized understanding and continuity of care.  Protocols used: Falls and [facility]

## 2024-04-25 ENCOUNTER — Other Ambulatory Visit: Payer: Self-pay

## 2024-04-25 ENCOUNTER — Encounter (INDEPENDENT_AMBULATORY_CARE_PROVIDER_SITE_OTHER): Payer: Self-pay

## 2024-04-25 ENCOUNTER — Other Ambulatory Visit: Payer: Self-pay | Admitting: Pharmacy Technician

## 2024-04-25 ENCOUNTER — Inpatient Hospital Stay: Attending: Nurse Practitioner

## 2024-04-25 DIAGNOSIS — E538 Deficiency of other specified B group vitamins: Secondary | ICD-10-CM | POA: Insufficient documentation

## 2024-04-25 MED ORDER — CYANOCOBALAMIN 1000 MCG/ML IJ SOLN
1000.0000 ug | Freq: Once | INTRAMUSCULAR | Status: AC
  Administered 2024-04-25: 1000 ug via INTRAMUSCULAR
  Filled 2024-04-25: qty 1

## 2024-04-25 NOTE — Progress Notes (Signed)
 Specialty Pharmacy Refill Coordination Note  Shawn [name] is a 38 y.o. male contacted today regarding refills of specialty medication(s) Ustekinumab  (STELARA )   Patient requested (Patient-Rptd) Delivery   Delivery date: 05/01/24  Verified address: (Patient-Rptd) [address]   Medication will be filled on 04/30/24.

## 2024-04-27 ENCOUNTER — Inpatient Hospital Stay

## 2024-04-30 ENCOUNTER — Other Ambulatory Visit: Payer: Self-pay

## 2024-05-03 ENCOUNTER — Other Ambulatory Visit: Payer: Self-pay

## 2024-05-15 ENCOUNTER — Encounter: Payer: Self-pay | Admitting: Pediatrics

## 2024-05-21 ENCOUNTER — Other Ambulatory Visit: Payer: Self-pay | Admitting: Pharmacy Technician

## 2024-05-21 ENCOUNTER — Other Ambulatory Visit: Payer: Self-pay

## 2024-05-21 ENCOUNTER — Encounter (INDEPENDENT_AMBULATORY_CARE_PROVIDER_SITE_OTHER): Payer: Self-pay

## 2024-05-21 NOTE — Progress Notes (Signed)
 Specialty Pharmacy Refill Coordination Note  Shawn [name] is a 38 y.o. male contacted today regarding refills of specialty medication(s) Ustekinumab  (STELARA )   Patient requested (Patient-Rptd) Delivery   Delivery date: 05/25/24 Verified address: (Patient-Rptd) [address]   Medication will be filled on 05/24/24.

## 2024-05-23 ENCOUNTER — Other Ambulatory Visit: Payer: Self-pay

## 2024-05-25 ENCOUNTER — Inpatient Hospital Stay

## 2024-05-25 ENCOUNTER — Telehealth: Payer: Self-pay

## 2024-06-05 ENCOUNTER — Ambulatory Visit (INDEPENDENT_AMBULATORY_CARE_PROVIDER_SITE_OTHER): Admitting: Family Medicine

## 2024-06-05 ENCOUNTER — Encounter (HOSPITAL_BASED_OUTPATIENT_CLINIC_OR_DEPARTMENT_OTHER): Payer: Self-pay | Admitting: Family Medicine

## 2024-06-05 VITALS — BP 125/87 | HR 76 | Ht 73.0 in | Wt 192.0 lb

## 2024-06-05 DIAGNOSIS — G8929 Other chronic pain: Secondary | ICD-10-CM

## 2024-06-05 DIAGNOSIS — N1831 Chronic kidney disease, stage 3a: Secondary | ICD-10-CM | POA: Diagnosis not present

## 2024-06-05 DIAGNOSIS — Z09 Encounter for follow-up examination after completed treatment for conditions other than malignant neoplasm: Secondary | ICD-10-CM

## 2024-06-05 DIAGNOSIS — M4628 Osteomyelitis of vertebra, sacral and sacrococcygeal region: Secondary | ICD-10-CM | POA: Diagnosis not present

## 2024-06-05 MED ORDER — OXYCODONE HCL 5 MG PO TABS: 5.0000 mg | ORAL_TABLET | Freq: Four times a day (QID) | ORAL | 0 refills | Status: AC | PRN

## 2024-06-05 NOTE — Patient Instructions (Addendum)
    [name] HERO, MD. Schedule an appointment as soon as possible.  Specialty: Gastroenterology Contact information: [address] [place] [place] [address] [phone]
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
## 2024-06-06 LAB — CBC WITH DIFFERENTIAL/PLATELET
Basophils Absolute: 0.1 x10E3/uL (ref 0.0–0.2)
Basos: 1 %
EOS (ABSOLUTE): 0.4 x10E3/uL (ref 0.0–0.4)
Eos: 3 %
Hematocrit: 41 % (ref 37.5–51.0)
Hemoglobin: 12.8 g/dL — ABNORMAL LOW (ref 13.0–17.7)
Immature Grans (Abs): 0.1 x10E3/uL (ref 0.0–0.1)
Immature Granulocytes: 1 %
Lymphocytes Absolute: 3.3 x10E3/uL — ABNORMAL HIGH (ref 0.7–3.1)
Lymphs: 26 %
MCH: 27.5 pg (ref 26.6–33.0)
MCHC: 31.2 g/dL — ABNORMAL LOW (ref 31.5–35.7)
MCV: 88 fL (ref 79–97)
Monocytes Absolute: 0.8 x10E3/uL (ref 0.1–0.9)
Monocytes: 6 %
Neutrophils Absolute: 8.2 x10E3/uL — ABNORMAL HIGH (ref 1.4–7.0)
Neutrophils: 63 %
Platelets: 331 x10E3/uL (ref 150–450)
RBC: 4.65 x10E6/uL (ref 4.14–5.80)
RDW: 16.2 % — ABNORMAL HIGH (ref 11.6–15.4)
WBC: 12.8 x10E3/uL — ABNORMAL HIGH (ref 3.4–10.8)

## 2024-06-06 LAB — COMPREHENSIVE METABOLIC PANEL WITH GFR
ALT: 9 IU/L (ref 0–44)
AST: 13 IU/L (ref 0–40)
Albumin: 3.8 g/dL — ABNORMAL LOW (ref 4.1–5.1)
Alkaline Phosphatase: 134 IU/L — ABNORMAL HIGH (ref 47–123)
BUN/Creatinine Ratio: 11 (ref 9–20)
BUN: 15 mg/dL (ref 6–24)
Bilirubin Total: 0.7 mg/dL (ref 0.0–1.2)
CO2: 21 mmol/L (ref 20–29)
Calcium: 8.7 mg/dL (ref 8.7–10.2)
Chloride: 106 mmol/L (ref 96–106)
Creatinine, Ser: 1.38 mg/dL — ABNORMAL HIGH (ref 0.76–1.27)
Globulin, Total: 3.3 g/dL (ref 1.5–4.5)
Glucose: 91 mg/dL (ref 70–99)
Potassium: 4.4 mmol/L (ref 3.5–5.2)
Sodium: 141 mmol/L (ref 134–144)
Total Protein: 7.1 g/dL (ref 6.0–8.5)
eGFR: 66 mL/min/1.73 (ref >59)

## 2024-06-06 LAB — MAGNESIUM: Magnesium: 2 mg/dL (ref 1.6–2.3)

## 2024-06-08 ENCOUNTER — Ambulatory Visit (HOSPITAL_BASED_OUTPATIENT_CLINIC_OR_DEPARTMENT_OTHER): Payer: Self-pay | Admitting: Family Medicine

## 2024-06-08 NOTE — Progress Notes (Signed)
 Hi [facility],  Your hemoglobin has improved. Your white blood cell count is slightly elevated and is likely due to the chronic osteomyelitis. Please make an appt with your GI and wound care to follow up. CMP is stable.

## 2024-06-14 ENCOUNTER — Other Ambulatory Visit: Payer: Self-pay

## 2024-06-14 ENCOUNTER — Other Ambulatory Visit (HOSPITAL_COMMUNITY): Payer: Self-pay

## 2024-06-14 ENCOUNTER — Encounter (INDEPENDENT_AMBULATORY_CARE_PROVIDER_SITE_OTHER): Payer: Self-pay

## 2024-06-14 NOTE — Progress Notes (Signed)
 Specialty Pharmacy Refill Coordination Note  Shawn [name] is a 38 y.o. male contacted today regarding refills of specialty medication(s) Ustekinumab  (STELARA )   Patient requested (Patient-Rptd) Delivery   Delivery date: 06/15/24   Verified address: (Patient-Rptd) [address]   Medication will be filled on 06/14/24.

## 2024-06-22 ENCOUNTER — Inpatient Hospital Stay

## 2024-06-25 ENCOUNTER — Encounter: Payer: Self-pay | Admitting: Radiology

## 2024-06-28 ENCOUNTER — Encounter (HOSPITAL_BASED_OUTPATIENT_CLINIC_OR_DEPARTMENT_OTHER): Payer: Self-pay | Admitting: Family Medicine

## 2024-07-02 ENCOUNTER — Telehealth: Payer: Self-pay

## 2024-07-02 NOTE — Telephone Encounter (Signed)
 MyChart message was sent to patient on 05/28/24 and he reviewed it with no response.   Attempted to reach patient at listed number. Phone line just rings, no prompt to leave voicemail. MyChart message sent to patient as well.

## 2024-07-02 NOTE — Telephone Encounter (Signed)
-----   Message from Shawn [name] CHRISTELLA Hausen sent at 07/01/2024  7:38 PM EST ----- Regarding: Overdue for clinic visit [name] no-showed for his last clinic visit after hospital discharge this past summer.  Please contact him and schedule him for a clinic visit with me or an APP within the next 1 to 3 months for follow-up of his Crohn's disease.  Thanks,  Shawn [name]

## 2024-07-03 NOTE — Telephone Encounter (Signed)
 Attempted to reach patient at new number twice. Goes straight to fast busy signal.

## 2024-07-05 ENCOUNTER — Other Ambulatory Visit (HOSPITAL_BASED_OUTPATIENT_CLINIC_OR_DEPARTMENT_OTHER): Payer: Self-pay | Admitting: Family Medicine

## 2024-07-05 ENCOUNTER — Other Ambulatory Visit: Payer: Self-pay

## 2024-07-05 ENCOUNTER — Encounter (HOSPITAL_BASED_OUTPATIENT_CLINIC_OR_DEPARTMENT_OTHER): Payer: Self-pay | Admitting: Family Medicine

## 2024-07-05 DIAGNOSIS — G8929 Other chronic pain: Secondary | ICD-10-CM

## 2024-07-05 DIAGNOSIS — M4628 Osteomyelitis of vertebra, sacral and sacrococcygeal region: Secondary | ICD-10-CM

## 2024-07-12 ENCOUNTER — Other Ambulatory Visit: Payer: Self-pay

## 2024-07-16 ENCOUNTER — Other Ambulatory Visit: Payer: Self-pay

## 2024-07-16 NOTE — Progress Notes (Signed)
 Specialty Pharmacy Refill Coordination Note  Shawn [name] is a 38 y.o. male contacted today regarding refills of specialty medication(s) Ustekinumab  (STELARA )   Patient requested Delivery   Delivery date: 07/18/24   Verified address: [address]   Medication will be filled on: 07/17/24

## 2024-07-20 ENCOUNTER — Inpatient Hospital Stay

## 2024-07-20 ENCOUNTER — Inpatient Hospital Stay: Attending: Nurse Practitioner

## 2024-07-20 DIAGNOSIS — E538 Deficiency of other specified B group vitamins: Secondary | ICD-10-CM

## 2024-07-20 MED ORDER — CYANOCOBALAMIN 1000 MCG/ML IJ SOLN
1000.0000 ug | Freq: Once | INTRAMUSCULAR | Status: AC
  Administered 2024-07-20: 1000 ug via INTRAMUSCULAR
  Filled 2024-07-20: qty 1

## 2024-07-20 MED ORDER — CYANOCOBALAMIN 1000 MCG/ML IJ SOLN: 1000.0000 ug | Freq: Once | INTRAMUSCULAR | Status: DC

## 2024-08-06 ENCOUNTER — Other Ambulatory Visit: Payer: Self-pay

## 2024-08-08 ENCOUNTER — Other Ambulatory Visit: Payer: Self-pay

## 2024-08-08 NOTE — Progress Notes (Signed)
 Specialty Pharmacy Refill Coordination Note  Shawn [name] is a 38 y.o. male contacted today regarding refills of specialty medication(s) Ustekinumab  (STELARA )   Patient requested (Patient-Rptd) Delivery   Delivery date: 08/09/24   Verified address: (Patient-Rptd) [address]   Medication will be filled on: 08/08/24

## 2024-08-09 ENCOUNTER — Other Ambulatory Visit: Payer: Self-pay

## 2024-08-12 ENCOUNTER — Other Ambulatory Visit: Payer: Self-pay | Admitting: Nurse Practitioner

## 2024-08-12 NOTE — Progress Notes (Deleted)
 "     [facility]   [phone] [phone]    Patient Care Team: [name], FNP as PCP - General (Family Medicine) [name], MD as Consulting Physician (Colon and Rectal Surgery) Comer, Lamar ORN, MD (Inactive) as Consulting Physician (Infectious Diseases) McGreal, Inocente HERO, MD as Consulting Physician (Gastroenterology)   CHIEF COMPLAINT: Follow up anemia   CURRENT THERAPY:   INTERVAL HISTORY   ROS   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   PO B12 initiated 12/31/20   Anxiety    Arthritis    Chronic kidney disease    Crohn's colitis (HCC)    Depression    Dilated cardiomyopathy (HCC) 05/18/2013   By catheterization 2014    Fistula    GERD (gastroesophageal reflux disease)    Hypertension    Neuromuscular disorder (HCC)    Osteomyelitis (HCC)    tailbone   Pneumonia      Past Surgical History:  Procedure Laterality Date   BIOPSY  01/01/2021   Procedure: BIOPSY;  Surgeon: Aneita Gwendlyn DASEN, MD;  Location: [facility] ENDOSCOPY;  Service: Endoscopy;;   COCCYGECTOMY  07/07/2021   COLONOSCOPY WITH PROPOFOL  N/A 01/01/2021   Procedure: COLONOSCOPY WITH PROPOFOL ;  Surgeon: Aneita Gwendlyn DASEN, MD;  Location: [facility] ENDOSCOPY;  Service: Endoscopy;  Laterality: N/A;   INCISION AND DRAINAGE ABSCESS N/A 07/07/2021   Procedure: MODIFIED HANLEY PROCEDURE, DRAINAGE WITH SETON PLACEMENT;  Surgeon: [name], MD;  Location: [facility] ORS;  Service: General;  Laterality: N/A;   INCISION AND DRAINAGE ABSCESS  05/04/2017   right groin   INCISION AND DRAINAGE PERIRECTAL ABSCESS  04/21/2021   INCISION AND DRAINAGE PERIRECTAL ABSCESS  05/18/2013   LEFT AND RIGHT HEART CATHETERIZATION WITH CORONARY ANGIOGRAM N/A 11/21/2012   Procedure: LEFT AND RIGHT HEART CATHETERIZATION WITH CORONARY ANGIOGRAM;  Surgeon: Salena GORMAN Negri, MD;  Location:  [facility] CATH LAB;  Service: Cardiovascular;  Laterality: N/A;   MANDIBLE FRACTURE SURGERY  08/23/2004   PLACEMENT OF SETON N/A 07/07/2021   Procedure: PLACEMENT OF SETON;  Surgeon: [name], MD;  Location: [facility] ORS;  Service: General;  Laterality: N/A;   PLACEMENT OF SETON N/A 01/15/2022   Procedure: PLACEMENT OF SETON;  Surgeon: [name], MD;  Location: [facility] ORS;  Service: General;  Laterality: N/A;   RECTAL EXAM UNDER ANESTHESIA N/A 01/15/2022   Procedure: RECTAL EXAM UNDER ANESTHESIA;  Surgeon: [name], MD;  Location: [facility] ORS;  Service: General;  Laterality: N/A;  TRANSRECTAL DRAINAGE REPLACEMENT OF SETON EXCISION OF PERINEAL SINUS TRACT ANORECTAL EXAM UNDER ANESTHESIA     Outpatient Encounter Medications as of 08/14/2024  Medication Sig Note   acetaminophen  (TYLENOL ) 500 MG tablet Take 2 tablets (1,000 mg total) by mouth every 8 (eight) hours as needed for mild pain (pain score 1-3) or fever.    bisacodyl  (DULCOLAX) 5 MG EC tablet Take 2 tablets (10 mg total) by mouth daily as needed for moderate constipation or mild constipation.    polyethylene glycol (MIRALAX  / GLYCOLAX ) 17 g packet Take 17 g by mouth 3 (three) times daily.    senna-docusate (SENOKOT-S) 8.6-50 MG tablet Take 2 tablets by mouth 2 (two) times daily.    ustekinumab  (STELARA ) 90 MG/ML SOSY injection Inject 1 mL (90 mg total) into the skin every 28 (twenty-eight) days.    Vitamin D , Ergocalciferol , (DRISDOL ) 1.25 MG (50000 UNIT)  CAPS capsule Take 1 capsule (50,000 Units total) by mouth every 7 (seven) days. (Patient not taking: Reported on 06/05/2024) 03/10/2024: Hasn't started   Facility-Administered Encounter Medications as of 08/14/2024  Medication   Chlorhexidine  Gluconate Cloth 2 % PADS 6 each   And   Chlorhexidine  Gluconate Cloth 2 % PADS 6 each   feeding supplement (ENSURE PRE-SURGERY) liquid 296 mL     There were no vitals filed for this visit. There is no height or weight on file to calculate BMI.    ECOG PERFORMANCE STATUS: {CHL ONC ECOG PS:[id number]}  PHYSICAL EXAM GENERAL:alert, no distress and comfortable SKIN: no rash  EYES: sclera clear NECK: without mass LYMPH:  no palpable cervical or supraclavicular lymphadenopathy  LUNGS: clear with normal breathing effort HEART: regular rate & rhythm, no lower extremity edema ABDOMEN: abdomen soft, non-tender and normal bowel sounds NEURO: alert & oriented x 3 with fluent speech, no focal motor/sensory deficits Breast exam:  PAC without erythema    CBC    Latest Ref Rng & Units 06/05/2024    3:32 PM 03/15/2024    1:55 AM 03/14/2024    3:52 AM  CBC  WBC 3.4 - 10.8 x10E3/uL 12.8  8.7  9.9   Hemoglobin 13.0 - 17.7 g/dL 87.1  8.9  8.8   Hematocrit 37.5 - 51.0 % 41.0  28.5  27.5   Platelets 150 - 450 x10E3/uL 331  405  401       CMP     Latest Ref Rng & Units 06/05/2024    3:32 PM 03/16/2024    3:29 AM 03/15/2024    4:20 PM  CMP  Glucose 70 - 99 mg/dL 91  890  875   BUN 6 - 24 mg/dL 15  21  19    Creatinine 0.76 - 1.27 mg/dL 8.61  8.37  8.67   Sodium 134 - 144 mmol/L 141  140  140   Potassium 3.5 - 5.2 mmol/L 4.4  5.0  4.9   Chloride 96 - 106 mmol/L 106  110  111   CO2 20 - 29 mmol/L 21  24  22    Calcium  8.7 - 10.2 mg/dL 8.7  8.5  8.6   Total Protein 6.0 - 8.5 g/dL 7.1     Total Bilirubin 0.0 - 1.2 mg/dL 0.7     Alkaline Phos 47 - 123 IU/L 134     AST 0 - 40 IU/L 13     ALT 0 - 44 IU/L 9         ASSESSMENT & PLAN:38 year old make   Microcytic anemia - He developed a mild intermittent anemia in 2014 that became persistent after 2021  -Other workup showed extremely low vitamin B12, no significant iron  or folate deficiencies - He ultimately developed colonic and severe perianal fistulizing Crohn's disease in 2021 complicated by recurrent perianal abscess and coccygeal osteomyelitis.  Required frequent antibiotics and deals with chronic pain -With recurrent abscess and pneumonia early 2025 anemia worsened to 8-9  range -Ferritin has been elevated at times, likely due to inflammation/ crohn's  -No evidence of malignancy on CT from 10/2023 -We discussed his anemia is multifactorial, secondary to chronic/recurrent infection, B12 deficiency, past alcohol use, CKD, and chronic disease.   -Strongly encouraged to abstain from alcohol and tobacco -Indolent MDS is less likely, we are not recommending a bone marrow biopsy at this time -B12 on 02/04/24 very low at 105, did not answer for phone f/up  PLAN:  No orders of the defined types were placed in this encounter.     All questions were answered. The patient knows to call the clinic with any problems, questions or concerns. No barriers to learning were detected. I spent *** counseling the patient face to face. The total time spent in the appointment was *** and more than 50% was on counseling, review of test results, and coordination of care.   Stephanos Fan K Scottlynn Lindell, NP 08/12/2024 8:35 PM  "

## 2024-08-14 ENCOUNTER — Inpatient Hospital Stay: Admitting: Nurse Practitioner

## 2024-08-14 ENCOUNTER — Inpatient Hospital Stay: Attending: Nurse Practitioner

## 2024-08-14 ENCOUNTER — Other Ambulatory Visit: Payer: Self-pay

## 2024-08-19 NOTE — Progress Notes (Unsigned)
 "     [facility]   [phone] [phone]    Patient Care Team: [name], FNP as PCP - General (Family Medicine) [name], MD as Consulting Physician (Colon and Rectal Surgery) Comer, Lamar ORN, MD (Inactive) as Consulting Physician (Infectious Diseases) McGreal, Inocente HERO, MD as Consulting Physician (Gastroenterology)   CHIEF COMPLAINT: Follow up anemia   CURRENT THERAPY: B12 injections  INTERVAL HISTORY Mr. Treece returns for follow-up.  Energy varies as does his appetite, secondary to Crohn's.  He is depressed from chronic pain related to osteomyelitis.  Thinks he had a Crohn's flare few weeks ago with hot flashes and mucus in his stool.  Bowels more regular lately.  Denies any bleeding.  ROS  All other systems reviewed and negative  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   PO B12 initiated 12/31/20   Anxiety    Arthritis    Chronic kidney disease    Crohn's colitis (HCC)    Depression    Dilated cardiomyopathy (HCC) 05/18/2013   By catheterization 2014    Fistula    GERD (gastroesophageal reflux disease)    Hypertension    Neuromuscular disorder (HCC)    Osteomyelitis (HCC)    tailbone   Pneumonia      Past Surgical History:  Procedure Laterality Date   BIOPSY  01/01/2021   Procedure: BIOPSY;  Surgeon: Aneita Gwendlyn DASEN, MD;  Location: [facility] ENDOSCOPY;  Service: Endoscopy;;   COCCYGECTOMY  07/07/2021   COLONOSCOPY WITH PROPOFOL  N/A 01/01/2021   Procedure: COLONOSCOPY WITH PROPOFOL ;  Surgeon: Aneita Gwendlyn DASEN, MD;  Location: [facility] ENDOSCOPY;  Service: Endoscopy;  Laterality: N/A;   INCISION AND DRAINAGE ABSCESS N/A 07/07/2021   Procedure: MODIFIED HANLEY PROCEDURE, DRAINAGE WITH SETON PLACEMENT;  Surgeon: [name], MD;  Location: [facility] ORS;  Service: General;  Laterality: N/A;   INCISION AND  DRAINAGE ABSCESS  05/04/2017   right groin   INCISION AND DRAINAGE PERIRECTAL ABSCESS  04/21/2021   INCISION AND DRAINAGE PERIRECTAL ABSCESS  05/18/2013   LEFT AND RIGHT HEART CATHETERIZATION WITH CORONARY ANGIOGRAM N/A 11/21/2012   Procedure: LEFT AND RIGHT HEART CATHETERIZATION WITH CORONARY ANGIOGRAM;  Surgeon: Salena GORMAN Negri, MD;  Location: [facility] CATH LAB;  Service: Cardiovascular;  Laterality: N/A;   MANDIBLE FRACTURE SURGERY  08/23/2004   PLACEMENT OF SETON N/A 07/07/2021   Procedure: PLACEMENT OF SETON;  Surgeon: [name], MD;  Location: [facility] ORS;  Service: General;  Laterality: N/A;   PLACEMENT OF SETON N/A 01/15/2022   Procedure: PLACEMENT OF SETON;  Surgeon: [name], MD;  Location: [facility] ORS;  Service: General;  Laterality: N/A;   RECTAL EXAM UNDER ANESTHESIA N/A 01/15/2022   Procedure: RECTAL EXAM UNDER ANESTHESIA;  Surgeon: [name], MD;  Location: [facility];  Service: General;  Laterality: N/A;  TRANSRECTAL DRAINAGE REPLACEMENT OF SETON EXCISION OF PERINEAL SINUS TRACT ANORECTAL EXAM UNDER ANESTHESIA     Outpatient Encounter Medications as of 08/20/2024  Medication Sig Note   acetaminophen  (TYLENOL ) 500 MG tablet Take 2 tablets (1,000 mg total) by mouth every 8 (eight) hours as needed for mild pain (pain score 1-3) or fever.    bisacodyl  (DULCOLAX) 5 MG EC tablet Take 2 tablets (10 mg total) by mouth daily as needed for moderate constipation or mild constipation.    polyethylene glycol (MIRALAX  / GLYCOLAX ) 17 g packet Take  17 g by mouth 3 (three) times daily.    senna-docusate (SENOKOT-S) 8.6-50 MG tablet Take 2 tablets by mouth 2 (two) times daily.    ustekinumab  (STELARA ) 90 MG/ML SOSY injection Inject 1 mL (90 mg total) into the skin every 28 (twenty-eight) days.    Vitamin D , Ergocalciferol , (DRISDOL ) 1.25 MG (50000 UNIT) CAPS capsule Take 1 capsule (50,000 Units total) by mouth every 7 (seven) days. (Patient not taking: Reported on 08/20/2024) 03/10/2024: Hasn't started    Facility-Administered Encounter Medications as of 08/20/2024  Medication   Chlorhexidine  Gluconate Cloth 2 % PADS 6 each   And   Chlorhexidine  Gluconate Cloth 2 % PADS 6 each   feeding supplement (ENSURE PRE-SURGERY) liquid 296 mL     Today's Vitals   08/20/24 0908 08/20/24 0909  BP: (!) 137/98 (!) 134/91  Pulse: 67   Resp: 17   Temp: (!) 97.2 F (36.2 C)   SpO2: 100%   Weight: 199 lb 3.2 oz (90.4 kg)   PainSc:  8    Body mass index is 26.28 kg/m.    PHYSICAL EXAM GENERAL:alert, no distress and comfortable SKIN: no rash  EYES: sclera clear LUNGS: clear with normal breathing effort HEART: regular rate & rhythm, no lower extremity edema ABDOMEN: abdomen soft, non-tender and normal bowel sounds NEURO: alert & oriented x 3 with fluent speech, flat affect, no focal motor/sensory deficits   CBC    Latest Ref Rng & Units 08/20/2024    8:45 AM 06/05/2024    3:32 PM 03/15/2024    1:55 AM  CBC  WBC 4.0 - 10.5 K/uL 9.4  12.8  8.7   Hemoglobin 13.0 - 17.0 g/dL 86.4  87.1  8.9   Hematocrit 39.0 - 52.0 % 39.9  41.0  28.5   Platelets 150 - 400 K/uL 345  331  405       CMP     Latest Ref Rng & Units 08/20/2024    8:45 AM 06/05/2024    3:32 PM 03/16/2024    3:29 AM  CMP  Glucose 70 - 99 mg/dL 890  91  890   BUN 6 - 20 mg/dL 17  15  21    Creatinine 0.61 - 1.24 mg/dL 8.59  8.61  8.37   Sodium 135 - 145 mmol/L 141  141  140   Potassium 3.5 - 5.1 mmol/L 4.1  4.4  5.0   Chloride 98 - 111 mmol/L 109  106  110   CO2 22 - 32 mmol/L 22  21  24    Calcium  8.9 - 10.3 mg/dL 9.0  8.7  8.5   Total Protein 6.5 - 8.1 g/dL 8.0  7.1    Total Bilirubin 0.0 - 1.2 mg/dL 0.4  0.7    Alkaline Phos 38 - 126 U/L 148  134    AST 15 - 41 U/L 21  13    ALT 0 - 44 U/L 20  9        ASSESSMENT & PLAN:38 year old make   Microcytic anemia - He developed a mild intermittent anemia in 2014 that became persistent after 2021  -Other workup showed extremely low vitamin B12, no significant iron  or  folate deficiencies - He ultimately developed colonic and severe perianal fistulizing Crohn's disease in 2021 complicated by recurrent perianal abscess and coccygeal osteomyelitis.  Required frequent antibiotics and deals with chronic pain -With recurrent abscess and pneumonia early 2025 anemia worsened to 8-9 range -Ferritin has been elevated at times, likely  due to inflammation/ crohn's  -No evidence of malignancy on CT from 10/2023 -We discussed his anemia is multifactorial, secondary to chronic/recurrent infection, B12 deficiency, past alcohol use, CKD, and chronic disease.   -Strongly encouraged to abstain from alcohol and tobacco -Indolent MDS is less likely, we are not recommending a bone marrow biopsy at this time -B12 on 02/04/24 very low at 105, did not answer for phone f/up; received B12 injection 03/11/2024, 04/25/2024, and 07/20/2024 - Mr. Weedman appears well from an anemia standpoint, CBC normalized.  Today's ferritin and B12 are in normal range.  Chronic pain secondary to osteomyelitis, Crohn's, depression - Stelara  for Crohn's -Chronic pain 2/2 osteomyelitis, cymbalta  in the past was not helpful  - Strongly encouraged him to follow-up with GI and PCP     PLAN: -Labs reviewed, anemia resolved; normal ferritin and B12 -Continue monthly B12 injections -Lab q3 months -F/up with me in 6 months, or sooner if needed  -F/up with PCP and GI for Crohn's, chronic pain, and depression; I cc'd this note -Appreciate SW seeing him today  Orders Placed This Encounter  Procedures   CBC with Differential (Cancer Center Only)    Standing Status:   Standing    Number of Occurrences:   3    Expiration Date:   08/20/2025   Ferritin    Standing Status:   Standing    Number of Occurrences:   3    Expiration Date:   08/20/2025   Iron  and Iron  Binding Capacity ([facility] only)    Standing Status:   Standing    Number of Occurrences:   3    Expiration Date:   08/20/2025   Vitamin B12     Standing Status:   Standing    Number of Occurrences:   3    Expiration Date:   08/20/2025     All questions were answered. The patient knows to call the clinic with any problems, questions or concerns. No barriers to learning were detected.  Atalya Dano K Bridget [place], NP 08/20/2024  "

## 2024-08-20 ENCOUNTER — Encounter: Payer: Self-pay | Admitting: Nurse Practitioner

## 2024-08-20 ENCOUNTER — Inpatient Hospital Stay: Admitting: Nurse Practitioner

## 2024-08-20 ENCOUNTER — Inpatient Hospital Stay: Attending: Nurse Practitioner

## 2024-08-20 ENCOUNTER — Ambulatory Visit: Payer: Self-pay | Admitting: Nurse Practitioner

## 2024-08-20 ENCOUNTER — Inpatient Hospital Stay

## 2024-08-20 ENCOUNTER — Inpatient Hospital Stay: Admitting: Licensed Clinical Social Worker

## 2024-08-20 VITALS — BP 134/91 | HR 67 | Temp 97.2°F | Resp 17 | Wt 199.2 lb

## 2024-08-20 DIAGNOSIS — E538 Deficiency of other specified B group vitamins: Secondary | ICD-10-CM | POA: Insufficient documentation

## 2024-08-20 DIAGNOSIS — N189 Chronic kidney disease, unspecified: Secondary | ICD-10-CM | POA: Insufficient documentation

## 2024-08-20 DIAGNOSIS — M869 Osteomyelitis, unspecified: Secondary | ICD-10-CM | POA: Insufficient documentation

## 2024-08-20 DIAGNOSIS — K5090 Crohn's disease, unspecified, without complications: Secondary | ICD-10-CM | POA: Insufficient documentation

## 2024-08-20 DIAGNOSIS — G8929 Other chronic pain: Secondary | ICD-10-CM | POA: Insufficient documentation

## 2024-08-20 DIAGNOSIS — D509 Iron deficiency anemia, unspecified: Secondary | ICD-10-CM | POA: Diagnosis present

## 2024-08-20 DIAGNOSIS — D649 Anemia, unspecified: Secondary | ICD-10-CM

## 2024-08-20 LAB — CBC WITH DIFFERENTIAL (CANCER CENTER ONLY)
Abs Immature Granulocytes: 0.06 K/uL (ref 0.00–0.07)
Basophils Absolute: 0.1 K/uL (ref 0.0–0.1)
Basophils Relative: 1 %
Eosinophils Absolute: 0.4 K/uL (ref 0.0–0.5)
Eosinophils Relative: 4 %
HCT: 39.9 % (ref 39.0–52.0)
Hemoglobin: 13.5 g/dL (ref 13.0–17.0)
Immature Granulocytes: 1 %
Lymphocytes Relative: 34 %
Lymphs Abs: 3.2 K/uL (ref 0.7–4.0)
MCH: 27.2 pg (ref 26.0–34.0)
MCHC: 33.8 g/dL (ref 30.0–36.0)
MCV: 80.3 fL (ref 80.0–100.0)
Monocytes Absolute: 0.5 K/uL (ref 0.1–1.0)
Monocytes Relative: 6 %
Neutro Abs: 5.2 K/uL (ref 1.7–7.7)
Neutrophils Relative %: 54 %
Platelet Count: 345 K/uL (ref 150–400)
RBC: 4.97 MIL/uL (ref 4.22–5.81)
RDW: 17.1 % — ABNORMAL HIGH (ref 11.5–15.5)
WBC Count: 9.4 K/uL (ref 4.0–10.5)
nRBC: 0 % (ref 0.0–0.2)

## 2024-08-20 LAB — FERRITIN: Ferritin: 73 ng/mL (ref 24–336)

## 2024-08-20 LAB — CMP (CANCER CENTER ONLY)
ALT: 20 U/L (ref 0–44)
AST: 21 U/L (ref 15–41)
Albumin: 3.7 g/dL (ref 3.5–5.0)
Alkaline Phosphatase: 148 U/L — ABNORMAL HIGH (ref 38–126)
Anion gap: 10 (ref 5–15)
BUN: 17 mg/dL (ref 6–20)
CO2: 22 mmol/L (ref 22–32)
Calcium: 9 mg/dL (ref 8.9–10.3)
Chloride: 109 mmol/L (ref 98–111)
Creatinine: 1.4 mg/dL — ABNORMAL HIGH (ref 0.61–1.24)
GFR, Estimated: >60 mL/min
Glucose, Bld: 109 mg/dL — ABNORMAL HIGH (ref 70–99)
Potassium: 4.1 mmol/L (ref 3.5–5.1)
Sodium: 141 mmol/L (ref 135–145)
Total Bilirubin: 0.4 mg/dL (ref 0.0–1.2)
Total Protein: 8 g/dL (ref 6.5–8.1)

## 2024-08-20 LAB — IRON AND IRON BINDING CAPACITY (CC-WL,HP ONLY)
Iron: 39 ug/dL — ABNORMAL LOW (ref 45–182)
Saturation Ratios: 15 % — ABNORMAL LOW (ref 17.9–39.5)
TIBC: 263 ug/dL (ref 250–450)
UIBC: 224 ug/dL

## 2024-08-20 LAB — VITAMIN B12: Vitamin B-12: 251 pg/mL (ref 180–914)

## 2024-08-20 MED ORDER — CYANOCOBALAMIN 1000 MCG/ML IJ SOLN
1000.0000 ug | Freq: Once | INTRAMUSCULAR | Status: AC
  Administered 2024-08-20: 1000 ug via INTRAMUSCULAR
  Filled 2024-08-20: qty 1

## 2024-08-20 MED ORDER — CYANOCOBALAMIN 1000 MCG/ML IJ SOLN: 1000.0000 ug | Freq: Once | INTRAMUSCULAR | Status: DC

## 2024-08-20 NOTE — Progress Notes (Signed)
 [facility] CSW Progress Note   Interventions: CSW received request to see pt due to depression.  CSW met w/ pt during doctor's visit.  Pt endorses depression as a result of chronic daily pain.  Per pt he has chronic pain from his coccyx bone which his PCP has been informed of, but has not been able to identify a cause for.  Pt reports the daily pain makes it difficult for him to move and engage in daily living activities as the pain is constant.  Per pt he was prescribed Cymbalta  in the past for the pain, but it did not help with the pain or depressive symptoms.  Pt is not engaged in counseling, but receptive to receiving contact details for counselors via email.  CSW discussed pt's frustration w/ NP Lacie Burton who will further discuss w/ pt and communicate w/ PCP if appropriate.  List of counselors accepting pt's insurance emailed to email address on file.  Pt provided w/ contact details for CSW for any additional concerns he may have.        Follow Up Plan:  Patient will contact CSW with any support or resource needs    Devere JONELLE Manna, LCSW Clinical Social Worker [facility]    Patient is participating in a Managed Medicaid Plan:  Yes

## 2024-08-21 ENCOUNTER — Other Ambulatory Visit: Payer: Self-pay

## 2024-08-24 ENCOUNTER — Telehealth: Payer: Self-pay

## 2024-08-24 NOTE — Telephone Encounter (Addendum)
"  Left message for pt to call back   "

## 2024-08-24 NOTE — Telephone Encounter (Signed)
 Message Received: Shawn [name], Shawn HERO, MD  Shawn [name], Shawn K, NP; Shawn [name], Shawn Olivia, FNP; P Lbgi Pod B Triage Thanks, Shawn.  Despite phone calls and MyChart messages sent by me and my staff, [name] has not followed up in the gastroenterology office.  This has made managing his Crohn's disease which is thought to be a contributing factor to his osteomyelitis challenging.  We will reach out to him again to stressed the importance of being seen by me or an APP in the near future.  POB B -please contact [name] and schedule him for a follow-up clinic visit with me or an APP in January 2026 for follow-up of his Crohn's disease.  Best,  Shawn

## 2024-08-27 NOTE — Telephone Encounter (Signed)
 Unable to reach pt.  Line goes directly to busy signal.

## 2024-08-29 ENCOUNTER — Other Ambulatory Visit (HOSPITAL_COMMUNITY): Payer: Self-pay

## 2024-08-29 NOTE — Progress Notes (Signed)
 Attempted to reach pt by phone, continue to get busy signal.

## 2024-08-31 ENCOUNTER — Other Ambulatory Visit: Payer: Self-pay

## 2024-09-04 ENCOUNTER — Other Ambulatory Visit: Payer: Self-pay

## 2024-09-04 NOTE — Telephone Encounter (Signed)
 Unable to reach pt.  Line goes directly to busy signal.

## 2024-09-05 NOTE — Telephone Encounter (Signed)
 Letter sent to patient.

## 2024-09-17 ENCOUNTER — Encounter (HOSPITAL_BASED_OUTPATIENT_CLINIC_OR_DEPARTMENT_OTHER): Payer: Self-pay | Admitting: Family Medicine

## 2024-09-17 ENCOUNTER — Other Ambulatory Visit: Payer: Self-pay

## 2024-09-17 NOTE — Telephone Encounter (Signed)
 Please see mychart message sent by pt and advise.

## 2024-09-17 NOTE — Progress Notes (Signed)
 Specialty Pharmacy Ongoing Clinical Assessment Note  Shawn [name] is a 39 y.o. male who is being followed by the specialty pharmacy service for RxSp Crohn's Disease   Patient's specialty medication(s) reviewed today: Ustekinumab  (STELARA )   Missed doses in the last 4 weeks: 0   Patient/Caregiver did not have any additional questions or concerns.   Therapeutic benefit summary: Patient is achieving benefit   Adverse events/side effects summary: Experienced adverse events/side effects (some itching rash, tolerable at this time, told patient he could use OTC hydrocortisone  cream)   Patient's therapy is appropriate to: Continue    Goals Addressed             This Visit's Progress    Minimize recurrence of flares   On track    Patient is on track. Patient will maintain adherence         Follow up: 12 months  Silvano LOISE Dolly Specialty Pharmacist

## 2024-09-17 NOTE — Progress Notes (Signed)
 Specialty Pharmacy Refill Coordination Note  Shawn [name] is a 39 y.o. male contacted today regarding refills of specialty medication(s) Ustekinumab  (STELARA )   Patient requested Delivery   Delivery date: 09/19/24   Verified address: [address]   Medication will be filled on: 09/18/24

## 2024-09-18 ENCOUNTER — Emergency Department (HOSPITAL_COMMUNITY)

## 2024-09-18 ENCOUNTER — Inpatient Hospital Stay (HOSPITAL_COMMUNITY)
Admission: EM | Admit: 2024-09-18 | Discharge: 2024-09-21 | DRG: 385 | Disposition: A | Discharge status: Home / Self Care | Attending: Family Medicine | Admitting: Family Medicine

## 2024-09-18 ENCOUNTER — Other Ambulatory Visit: Payer: Self-pay

## 2024-09-18 DIAGNOSIS — Z5941 Food insecurity: Secondary | ICD-10-CM | POA: Diagnosis not present

## 2024-09-18 DIAGNOSIS — F32A Depression, unspecified: Secondary | ICD-10-CM | POA: Diagnosis present

## 2024-09-18 DIAGNOSIS — K603 Anal fistula, unspecified: Secondary | ICD-10-CM | POA: Diagnosis present

## 2024-09-18 DIAGNOSIS — Z5948 Other specified lack of adequate food: Secondary | ICD-10-CM

## 2024-09-18 DIAGNOSIS — Z5989 Other problems related to housing and economic circumstances: Secondary | ICD-10-CM | POA: Diagnosis not present

## 2024-09-18 DIAGNOSIS — L988 Other specified disorders of the skin and subcutaneous tissue: Secondary | ICD-10-CM | POA: Diagnosis present

## 2024-09-18 DIAGNOSIS — I10 Essential (primary) hypertension: Secondary | ICD-10-CM | POA: Diagnosis present

## 2024-09-18 DIAGNOSIS — Z8419 Family history of other disorders of kidney and ureter: Secondary | ICD-10-CM

## 2024-09-18 DIAGNOSIS — N182 Chronic kidney disease, stage 2 (mild): Secondary | ICD-10-CM

## 2024-09-18 DIAGNOSIS — I129 Hypertensive chronic kidney disease with stage 1 through stage 4 chronic kidney disease, or unspecified chronic kidney disease: Secondary | ICD-10-CM | POA: Diagnosis present

## 2024-09-18 DIAGNOSIS — I420 Dilated cardiomyopathy: Secondary | ICD-10-CM | POA: Diagnosis present

## 2024-09-18 DIAGNOSIS — Z59868 Other specified financial insecurity: Secondary | ICD-10-CM | POA: Diagnosis not present

## 2024-09-18 DIAGNOSIS — K651 Peritoneal abscess: Secondary | ICD-10-CM | POA: Diagnosis present

## 2024-09-18 DIAGNOSIS — Z91013 Allergy to seafood: Secondary | ICD-10-CM

## 2024-09-18 DIAGNOSIS — M609 Myositis, unspecified: Secondary | ICD-10-CM | POA: Diagnosis present

## 2024-09-18 DIAGNOSIS — Z87891 Personal history of nicotine dependence: Secondary | ICD-10-CM

## 2024-09-18 DIAGNOSIS — D649 Anemia, unspecified: Secondary | ICD-10-CM

## 2024-09-18 DIAGNOSIS — Z79620 Long term (current) use of immunosuppressive biologic: Secondary | ICD-10-CM | POA: Diagnosis not present

## 2024-09-18 DIAGNOSIS — D631 Anemia in chronic kidney disease: Secondary | ICD-10-CM | POA: Diagnosis present

## 2024-09-18 DIAGNOSIS — N1831 Chronic kidney disease, stage 3a: Secondary | ICD-10-CM | POA: Diagnosis present

## 2024-09-18 DIAGNOSIS — R700 Elevated erythrocyte sedimentation rate: Secondary | ICD-10-CM | POA: Diagnosis present

## 2024-09-18 DIAGNOSIS — D638 Anemia in other chronic diseases classified elsewhere: Secondary | ICD-10-CM | POA: Diagnosis not present

## 2024-09-18 DIAGNOSIS — D75839 Thrombocytosis, unspecified: Secondary | ICD-10-CM | POA: Diagnosis present

## 2024-09-18 DIAGNOSIS — K50114 Crohn's disease of large intestine with abscess: Secondary | ICD-10-CM | POA: Diagnosis not present

## 2024-09-18 DIAGNOSIS — F419 Anxiety disorder, unspecified: Secondary | ICD-10-CM | POA: Diagnosis present

## 2024-09-18 DIAGNOSIS — R7401 Elevation of levels of liver transaminase levels: Secondary | ICD-10-CM | POA: Diagnosis present

## 2024-09-18 DIAGNOSIS — M1990 Unspecified osteoarthritis, unspecified site: Secondary | ICD-10-CM | POA: Diagnosis present

## 2024-09-18 DIAGNOSIS — E538 Deficiency of other specified B group vitamins: Secondary | ICD-10-CM

## 2024-09-18 DIAGNOSIS — R070 Pain in throat: Secondary | ICD-10-CM | POA: Diagnosis not present

## 2024-09-18 DIAGNOSIS — K219 Gastro-esophageal reflux disease without esophagitis: Secondary | ICD-10-CM | POA: Diagnosis present

## 2024-09-18 DIAGNOSIS — Z883 Allergy status to other anti-infective agents status: Secondary | ICD-10-CM

## 2024-09-18 DIAGNOSIS — N183 Chronic kidney disease, stage 3 unspecified: Secondary | ICD-10-CM | POA: Diagnosis present

## 2024-09-18 DIAGNOSIS — Z59819 Housing instability, housed unspecified: Secondary | ICD-10-CM

## 2024-09-18 DIAGNOSIS — K50019 Crohn's disease of small intestine with unspecified complications: Secondary | ICD-10-CM

## 2024-09-18 DIAGNOSIS — M86651 Other chronic osteomyelitis, right thigh: Secondary | ICD-10-CM | POA: Diagnosis present

## 2024-09-18 DIAGNOSIS — M4628 Osteomyelitis of vertebra, sacral and sacrococcygeal region: Secondary | ICD-10-CM | POA: Diagnosis present

## 2024-09-18 DIAGNOSIS — M86659 Other chronic osteomyelitis, unspecified thigh: Secondary | ICD-10-CM | POA: Diagnosis not present

## 2024-09-18 DIAGNOSIS — K50113 Crohn's disease of large intestine with fistula: Principal | ICD-10-CM | POA: Diagnosis present

## 2024-09-18 DIAGNOSIS — R5383 Other fatigue: Secondary | ICD-10-CM | POA: Diagnosis present

## 2024-09-18 DIAGNOSIS — K612 Anorectal abscess: Secondary | ICD-10-CM | POA: Diagnosis present

## 2024-09-18 DIAGNOSIS — R933 Abnormal findings on diagnostic imaging of other parts of digestive tract: Secondary | ICD-10-CM

## 2024-09-18 DIAGNOSIS — Z79899 Other long term (current) drug therapy: Secondary | ICD-10-CM

## 2024-09-18 DIAGNOSIS — K610 Anal abscess: Secondary | ICD-10-CM | POA: Diagnosis present

## 2024-09-18 DIAGNOSIS — Z886 Allergy status to analgesic agent status: Secondary | ICD-10-CM

## 2024-09-18 LAB — SEDIMENTATION RATE: Sed Rate: 58 mm/h — ABNORMAL HIGH (ref 0–16)

## 2024-09-18 LAB — COMPREHENSIVE METABOLIC PANEL WITH GFR
ALT: 13 U/L (ref 0–44)
AST: 18 U/L (ref 15–41)
Albumin: 3.5 g/dL (ref 3.5–5.0)
Alkaline Phosphatase: 134 U/L — ABNORMAL HIGH (ref 38–126)
Anion gap: 11 (ref 5–15)
BUN: 19 mg/dL (ref 6–20)
CO2: 23 mmol/L (ref 22–32)
Calcium: 8.7 mg/dL — ABNORMAL LOW (ref 8.9–10.3)
Chloride: 103 mmol/L (ref 98–111)
Creatinine, Ser: 1.53 mg/dL — ABNORMAL HIGH (ref 0.61–1.24)
GFR, Estimated: 59 mL/min — ABNORMAL LOW
Glucose, Bld: 102 mg/dL — ABNORMAL HIGH (ref 70–99)
Potassium: 3.7 mmol/L (ref 3.5–5.1)
Sodium: 136 mmol/L (ref 135–145)
Total Bilirubin: 0.4 mg/dL (ref 0.0–1.2)
Total Protein: 7.8 g/dL (ref 6.5–8.1)

## 2024-09-18 LAB — CBC WITH DIFFERENTIAL/PLATELET
Abs Immature Granulocytes: 0.05 10*3/uL (ref 0.00–0.07)
Basophils Absolute: 0.1 10*3/uL (ref 0.0–0.1)
Basophils Relative: 0 %
Eosinophils Absolute: 0.4 10*3/uL (ref 0.0–0.5)
Eosinophils Relative: 2 %
HCT: 35.8 % — ABNORMAL LOW (ref 39.0–52.0)
Hemoglobin: 11.9 g/dL — ABNORMAL LOW (ref 13.0–17.0)
Immature Granulocytes: 0 %
Lymphocytes Relative: 21 %
Lymphs Abs: 3.2 10*3/uL (ref 0.7–4.0)
MCH: 26.7 pg (ref 26.0–34.0)
MCHC: 33.2 g/dL (ref 30.0–36.0)
MCV: 80.4 fL (ref 80.0–100.0)
Monocytes Absolute: 0.9 10*3/uL (ref 0.1–1.0)
Monocytes Relative: 6 %
Neutro Abs: 10.6 10*3/uL — ABNORMAL HIGH (ref 1.7–7.7)
Neutrophils Relative %: 71 %
Platelets: 301 10*3/uL (ref 150–400)
RBC: 4.45 MIL/uL (ref 4.22–5.81)
RDW: 16.5 % — ABNORMAL HIGH (ref 11.5–15.5)
WBC: 15.2 10*3/uL — ABNORMAL HIGH (ref 4.0–10.5)
nRBC: 0 % (ref 0.0–0.2)

## 2024-09-18 LAB — C-REACTIVE PROTEIN: CRP: 10.3 mg/dL — ABNORMAL HIGH

## 2024-09-18 MED ORDER — ACETAMINOPHEN 650 MG RE SUPP: 650.0000 mg | Freq: Four times a day (QID) | RECTAL | Status: DC | PRN

## 2024-09-18 MED ORDER — SENNOSIDES-DOCUSATE SODIUM 8.6-50 MG PO TABS
1.0000 | ORAL_TABLET | Freq: Two times a day (BID) | ORAL | Status: DC
  Administered 2024-09-18 – 2024-09-21 (×6): 1 via ORAL
  Filled 2024-09-18 (×6): qty 1

## 2024-09-18 MED ORDER — ACETAMINOPHEN 325 MG PO TABS: 650.0000 mg | ORAL_TABLET | Freq: Four times a day (QID) | ORAL | Status: DC | PRN

## 2024-09-18 MED ORDER — ONDANSETRON HCL 4 MG/2ML IJ SOLN
4.0000 mg | Freq: Once | INTRAMUSCULAR | Status: AC
  Administered 2024-09-18: 4 mg via INTRAVENOUS
  Filled 2024-09-18: qty 2

## 2024-09-18 MED ORDER — PIPERACILLIN-TAZOBACTAM 3.375 G IVPB
3.3750 g | Freq: Three times a day (TID) | INTRAVENOUS | Status: DC
  Administered 2024-09-18 – 2024-09-20 (×6): 3.375 g via INTRAVENOUS
  Filled 2024-09-18 (×6): qty 50

## 2024-09-18 MED ORDER — HYDROMORPHONE HCL 1 MG/ML IJ SOLN
1.0000 mg | Freq: Once | INTRAMUSCULAR | Status: AC
  Administered 2024-09-18: 1 mg via INTRAVENOUS
  Filled 2024-09-18: qty 1

## 2024-09-18 MED ORDER — GADOBUTROL 1 MMOL/ML IV SOLN
9.0000 mL | Freq: Once | INTRAVENOUS | Status: AC | PRN
  Administered 2024-09-18: 9 mL via INTRAVENOUS

## 2024-09-18 MED ORDER — LORAZEPAM 2 MG/ML IJ SOLN: 0.5000 mg | Freq: Once | INTRAMUSCULAR | Status: DC

## 2024-09-18 MED ORDER — LORAZEPAM 0.5 MG PO TABS
0.5000 mg | ORAL_TABLET | Freq: Once | ORAL | Status: AC
  Administered 2024-09-18: 0.5 mg via ORAL
  Filled 2024-09-18: qty 1

## 2024-09-18 MED ORDER — LACTATED RINGERS IV BOLUS
1000.0000 mL | Freq: Once | INTRAVENOUS | Status: AC
  Administered 2024-09-18: 1000 mL via INTRAVENOUS

## 2024-09-18 MED ORDER — HYDROMORPHONE HCL 1 MG/ML IJ SOLN
0.5000 mg | INTRAMUSCULAR | Status: DC | PRN
  Filled 2024-09-18: qty 0.5

## 2024-09-18 MED ORDER — ONDANSETRON HCL 4 MG/2ML IJ SOLN: 4.0000 mg | Freq: Four times a day (QID) | INTRAMUSCULAR | Status: DC | PRN

## 2024-09-18 MED ORDER — OXYCODONE HCL 5 MG PO TABS
5.0000 mg | ORAL_TABLET | ORAL | Status: AC | PRN
  Administered 2024-09-18 – 2024-09-19 (×4): 5 mg via ORAL
  Filled 2024-09-18 (×4): qty 1

## 2024-09-18 MED ORDER — ONDANSETRON HCL 4 MG PO TABS: 4.0000 mg | ORAL_TABLET | Freq: Four times a day (QID) | ORAL | Status: DC | PRN

## 2024-09-18 MED ORDER — IOHEXOL 300 MG/ML  SOLN
100.0000 mL | Freq: Once | INTRAMUSCULAR | Status: AC | PRN
  Administered 2024-09-18: 100 mL via INTRAVENOUS

## 2024-09-18 MED ORDER — PIPERACILLIN-TAZOBACTAM 3.375 G IVPB 30 MIN
3.3750 g | Freq: Once | INTRAVENOUS | Status: AC
  Administered 2024-09-18: 3.375 g via INTRAVENOUS
  Filled 2024-09-18: qty 50

## 2024-09-18 NOTE — ED Triage Notes (Signed)
 Pt reports perianal abscess that started draining yesterday, Hx crohns and has a seton band.

## 2024-09-18 NOTE — Consult Note (Signed)
 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  Hx of prior surgery to coccyx, seton Crohn's disease on Stellara Dilated cardiomyopathy Throat pain with infusion of Zosyn  today and prior rash at the infusion site of Zosyn  though he tolerated 6 weeks of it  Plan:  Will rechallenge with Zosyn  as I am loath to immediately pin his throat discomfort on a penicillin allergy Await general surgery's input Check ESR and CRP though CD itself can confound this he has been on active treatment Would hold biologics in near future I wonder given complexity if he would benefit from being transferred to an academic center Standard universal precautions      HPI: Shawn Bridges59 year old man with history of chron's disease diagnosed in 2002 who unfortunately has had fistulizing pathology and a history of osteomyelitis of the coccyx diagnosed in August 2022 due to this fistula status post prolonged courses of antibiotics followed by repeat I&D's recurrent sacrococcygeal osteomyelitis in January 2025 with intramuscular abscesses but negative cultures status post prolonged IV and oral antibiotics, chronic kidney disease dilated  cardiomyopathy who had been admitted March 10, 2024 with increasing perirectal pain.  He was followed by my partner Dr. Posey July.  A pelvic MRI was done which showed presacral abscess as well as sacral bone edema concerning for early osteomyelitis.  Surgery evaluated patient did not feel there was indication for surgical intervention unless they were going to do a  diverting ostomy to divert feces away from the site which the patient was refusing extensive coccygeal resection had been previously performed.  IR did perform a sampling of the area but nothing grew on culture because the patient is already on antibiotics he completed 6 weeks of Zosyn  but did not been seen by our group since then.  He has been on Stelara  regularly to treat his Crohn's disease and does also have a seton in place.  He presented to the ER after working third shift last night because he was having increasing rectal pain with purulent rectal discharge and ER labs were notable for some leukocytosis anemia elevated sed rate CRP.  Blood cultures were taken CT abdomen pelvis was performed which showed persistent but improved asymmetric left rectal wall thickening with improved sacral presacral gas with an ill-defined fluid gas within the eccentric left ischioanal fossa thought to be secondary to perianal fistula./  MRI of the pelvis with without contrast has now been performed as well.  This shows a posterior perianal fistula at the 6 o'clock position extending cephalad with a branch communicating with the rectum at the 5 o'clock position and a branch extending into a presacral abscess which is reduced in size to 6 cm with improvement presents to extension through the sacrococcygeal foramina and additional abscess extension below the left piriformis and deep to the left gluteus maximus measuring 7.2 cm with mild perirectal stranding and suspected seton with chronic osteomyelitis of all in the coccyx and lower sacrum and myositis involving the piriformis muscles and medius gluteus and gluteus maximus muscles left greater than  right.  The patient had been given a dose of Zosyn  earlier which again he taken for 6 weeks but this time had a sensation of pain in his throat though no difficulty breathing or clear-cut throat swelling or rash he states in the past he has had a rash at the site of Zosyn   infusion in his left arm but no diffuse rash and has mentioned that was treated for several weeks.  I am reluctant to label him as having a Zosyn /penicillin allergy at this point in time would like to continue Zosyn  for now. We will await Surgery's input on the case re whether or not they will pursue surgery and or ask IR to sample.  Unfortunately this is a very complicated case and the biologics that are helpful for his Crohn's do not help with infection and he has acute worsening of a chronic process.     I personally spent a total of 81 minutes in the care of the patient today including preparing to see the patient, getting/reviewing separately obtained history, performing a medically appropriate exam/evaluation, counseling and educating, placing orders, referring and communicating with other health care professionals, documenting clinical information in the EHR, independently interpreting results, communicating results, and coordinating care.   Evaluation of the patient requires complex antimicrobial therapy evaluation, counseling , isolation needs to reduce disease transmission and risk assessment and mitigation.     Review of Systems: Review of Systems  Constitutional:  Negative for chills, fever, malaise/fatigue and weight loss.  HENT:  Positive for sore throat. Negative for congestion.   Eyes:  Negative for blurred vision and photophobia.  Respiratory:  Negative for cough, shortness of breath and wheezing.   Cardiovascular:  Negative for chest pain, palpitations and leg swelling.  Gastrointestinal:  Negative for abdominal pain, blood in stool, constipation, diarrhea, heartburn, melena, nausea and vomiting.  Genitourinary:  Negative for dysuria, flank pain and hematuria.  Musculoskeletal:  Negative for back pain, falls, joint pain and myalgias.  Skin:  Negative for itching and rash.  Neurological:  Negative for dizziness, focal weakness, loss of consciousness, weakness and headaches.   Endo/Heme/Allergies:  Does not bruise/bleed easily.  Psychiatric/Behavioral:  Negative for depression and suicidal ideas. The patient does not have insomnia.     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   PO B12 initiated 12/31/20   Anxiety    Arthritis    Chronic kidney disease    Crohn's colitis (HCC)    Depression    Dilated cardiomyopathy (HCC) 05/18/2013   By catheterization 2014    Fistula    GERD (gastroesophageal reflux disease)    Hypertension    Neuromuscular disorder (HCC)    Osteomyelitis (HCC)    tailbone   Pneumonia     Social History[1]  Family History  Problem Relation Age of Onset   Diabetes Mother    Kidney failure Mother    Diabetes Sister    Colon cancer Neg Hx    Stomach cancer Neg Hx    Esophageal cancer Neg Hx    Pancreatic disease Neg Hx    Colon polyps Neg Hx    Rectal cancer Neg Hx    Allergies[2]  OBJECTIVE: Blood pressure (!) 142/82, pulse 75, temperature 98.1 F (36.7 C), temperature source Oral, resp. rate 17, SpO2 97%.  Physical Exam Exam conducted with a chaperone present.  Constitutional:  Appearance: He is well-developed.  HENT:     Head: Normocephalic and atraumatic.  Eyes:     Conjunctiva/sclera: Conjunctivae normal.  Cardiovascular:     Rate and Rhythm: Normal rate and regular rhythm.  Pulmonary:     Effort: Pulmonary effort is normal. No respiratory distress.     Breath sounds: No wheezing.  Abdominal:     General: There is no distension.     Palpations: Abdomen is soft.  Musculoskeletal:        General: No tenderness. Normal range of motion.     Cervical back: Normal range of motion and neck supple.  Skin:    General: Skin is warm and dry.     Coloration: Skin is not pale.     Findings: No erythema or rash.  Neurological:     General: No focal deficit present.     Mental Status: He is  alert and oriented to person, place, and time.  Psychiatric:        Mood and Affect: Mood normal.        Behavior: Behavior normal.        Thought Content: Thought content normal.        Judgment: Judgment normal.    Peri-rectal area with seton visible    Lab Results Lab Results  Component Value Date   WBC 15.2 (H) 09/18/2024   HGB 11.9 (L) 09/18/2024   HCT 35.8 (L) 09/18/2024   MCV 80.4 09/18/2024   PLT 301 09/18/2024    Lab Results  Component Value Date   CREATININE 1.53 (H) 09/18/2024   BUN 19 09/18/2024   NA 136 09/18/2024   K 3.7 09/18/2024   CL 103 09/18/2024   CO2 23 09/18/2024    Lab Results  Component Value Date   ALT 13 09/18/2024   AST 18 09/18/2024   ALKPHOS 134 (H) 09/18/2024   BILITOT 0.4 09/18/2024     Microbiology: No results found for this or any previous visit (from the past 240 hours).  Jomarie Fleeta Rothman, MD [facility] [phone] pager  09/18/2024, 5:02 PM      [1]  Social History Tobacco Use   Smoking status: Former    Current packs/day: 0.25    Types: Cigarettes   Smokeless tobacco: Never  Vaping Use   Vaping status: Never Used  Substance Use Topics   Alcohol use: Not Currently    Comment: occ   Drug use: No  [2]  Allergies Allergen Reactions   Shrimp [Shellfish Allergy] Shortness Of Breath   Nsaids Other (See Comments)    CROHN'S DISEASE = NO NSAIDS   Bactoshield Chg [Chlorhexidine  Gluconate] Itching   "

## 2024-09-18 NOTE — Progress Notes (Signed)
" ° ° ° ° ° °  Full note to follow I am starting patient back on Zosyn  he claimed that his throat hurt during the infusion earlier during his hospital stay today in the ER.  He also states that in the past he did have a rash at the site of infusion but not diffusely he was treated for more than a month with IV Zosyn  in the past.  I am not eager to immediately assign to him an allergy to Zosyn  and deprive him of the penicillin class so we will redosed him with Zosyn  and see how he tolerates this in a carefully monitored setting.  MRI is read is still pending   Shawn [name] 09/18/2024, 4:07 PM  "

## 2024-09-18 NOTE — Telephone Encounter (Signed)
 Sending this message to  Dr. Suzann for review as pt responded to her in our open encounter.

## 2024-09-18 NOTE — ED Provider Notes (Signed)
 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 Is a few days late on Stelara  and pain started yesterday. Also fevers and chills. Last BM Sunday night and woke up Monday morning in pain. No n/v. No abdominal pain.  States this has happened before in 2022 and had surgery and seton band. Was on remicade  but changed to stelara  some years ago and has been well except when he stopped it briefly last year and he had a flare up. Last dose about 4 weeks ago.  Current Outpatient Medications  Medication Instructions   acetaminophen  (TYLENOL ) 1,000 mg, Oral, Every 8 hours PRN   Dulcolax 10 mg, Oral, Daily PRN   polyethylene glycol (MIRALAX  / GLYCOLAX ) 17 g, Oral, 3 times daily   senna-docusate (SENOKOT-S) 8.6-50 MG tablet 2 tablets, Oral, 2 times daily   Stelara  90 mg, Subcutaneous, Every 28 days   Vitamin D  (Ergocalciferol ) (DRISDOL ) 50,000 Units, Oral, Every 7 days     Allergies: Cephalexin , Penicillins, and Sulfa  antibiotics    Review of Systems  +f/c, rectal pain  Updated Vital Signs BP (!) 139/101   Pulse 97   Temp 99.3 F (37.4 C) (Oral)   Resp 18   LMP 09/09/2024   SpO2 99%   Physical Exam  Constitutional: Patient appears well-developed and well-nourished. No distress.  HENT:  Head: Normocephalic and atraumatic.  Mouth/Throat: Oropharynx is clear and moist. No oropharyngeal exudate.  Eyes: Conjunctivae are normal. Pupils are equal, round, and reactive to light. Neck: Normal range of motion. Neck supple.  Cardiovascular: Normal rate, regular rhythm,  normal heart sounds and intact distal pulses.   Pulmonary/Chest: Effort normal and breath sounds normal. No respiratory distress. No wheezes or rales.  Abdominal: Soft. Bowel sounds are normal. No distension or tenderness.  Rectal: Seton band noted. Purulent drainage. Exquisite tenderness to region so full rectal exam deferred. Musculoskeletal: Normal range of motion. No edema or tenderness.  Neurological: Patient is alert and oriented.  No facial droop.  Clear speech.  Normal strength and sensation throughout.  Normal coordination. Skin: Skin is warm and dry. No diaphoresis.  Psychiatric: Normal mood and affect. Normal behavior. Judgment and thought content normal.  Nursing note and vitals reviewed.    Labs Reviewed  COMPREHENSIVE METABOLIC PANEL WITH GFR - Abnormal; Notable for the following components:      Result Value   Glucose, Bld 102 (*)    Creatinine, Ser 1.53 (*)    Calcium  8.7 (*)    Alkaline Phosphatase 134 (*)    GFR, Estimated 59 (*)    All other components within normal limits  CBC WITH DIFFERENTIAL/PLATELET - Abnormal; Notable for the following components:   WBC 15.2 (*)    Hemoglobin 11.9 (*)    HCT 35.8 (*)    RDW 16.5 (*)    Neutro Abs 10.6 (*)    All other components within normal limits  SEDIMENTATION RATE - Abnormal; Notable for the following components:   Sed Rate 58 (*)    All other components within normal limits  CULTURE, BLOOD (ROUTINE X 2)  CULTURE, BLOOD (ROUTINE X  2)  C-REACTIVE PROTEIN     CT ABDOMEN PELVIS W CONTRAST  Final Result    MR PELVIS W WO CONTRAST    (Results Pending)     Medications Ordered in the ED  LORazepam  (ATIVAN ) tablet 0.5 mg (has no administration in time range)  LORazepam  (ATIVAN ) injection 0.5 mg (has no administration in time range)  lactated ringers  bolus 1,000 mL (0 mLs Intravenous Stopped 09/18/24 1215)  piperacillin -tazobactam (ZOSYN ) IVPB 3.375 g (0 g Intravenous Stopped 09/18/24 1103)  HYDROmorphone  (DILAUDID )  injection 1 mg (1 mg Intravenous Given 09/18/24 1024)  ondansetron  (ZOFRAN ) injection 4 mg (4 mg Intravenous Given 09/18/24 1215)  iohexol  (OMNIPAQUE ) 300 MG/ML solution 100 mL (100 mLs Intravenous Contrast Given 09/18/24 1237)                                     This patient presents to the ED with chief complaint(s) of perirectal pain and drainage as well as subjective fevers and chills with pertinent past medical history of chron's disease with chronic perianal fistula and history of abscess with osteomyelitis of coccyx as well. The complaint involves an extensive differential diagnosis and also carries with it a high risk of complications and morbidity.    Additional history obtained from none. I have also reviewed previous ED visits and inpatient records and GI notes  The differential diagnosis includes Crohn's exacerbation, abscess  The initial management included labs, blood cultures, Zosyn , IV fluids, Dilaudid , CT scan    Reassessments:  The following labs were independently interpreted: White count 15.2, creatinine 1.53 which is similar to baseline, ESR elevated at 58, CRP pending  I independently visualized the following imaging with scope of interpretation limited to determining acute life threatening conditions related to emergency care: CT abdomen pelvis, which revealed multiple stable findings  Treatment and Reassessment: Patient feeling better after Dilaudid   Consultation: - Consulted or discussed management/test interpretation with external professional: Discussed with GI who recommended admit to medicine and get pelvic MRI which I have ordered.  Also discussed with hospitalist who will admit.  Consideration for admission or further workup: Warrants admission  Social Determinants of health: None    ICD-10-CM   1. Crohn's colitis, with abscess [facility])  K50.114        ED Discharge Orders     None          Fredia Rosette Kirsch, MD 09/18/24 1432  "

## 2024-09-18 NOTE — Consult Note (Addendum)
 "  Referring Provider: Dr. Fredia Primary Care Physician:  [name], FNP Primary Gastroenterologist:  Dr. Suzann   Reason for Consultation:  Rectal pain, perianal Crohn's disease  HPI: Shawn [name] is a 39 y.o. male is a 39 y.o. male with a past medical history of arthritis, hypertension, dilated cardiomyopathy with LV EF 65 - 70% per ECHO 11/2020. 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 Started on Ustekinumab  (Stelara ) 11/2023.   He presented to the ED earlier today due to having rectal pain with purulent rectal discharge.  Labs in the ED showed a WBC count of 15.2.  Hemoglobin 11.9 down from 13.5 on 12/29.  Hematocrit 35.8.  Platelets 301.  Creatinine 1.53 up from 1.40 on 12/29.  Total bili 0.4.  Alk phos 134.  AST 18.  ALT 13.  Sed rate 58.  CRP pending.  Blood cultures pending.  CTAP with contrast showed evidence of a perianal fistula which was not completely evaluated per CT and left rectal wall thickening suggestive of proctitis was noted.  A pelvic MRI with and without contrast was completed, results pending.  He received LR 1 L bolus, Dilaudid  IV and received Zosyn  3.37 gm IV x 1.  He stated having brief throat pain and redness at the IV site after he received Zosyn  in the ED.  ID at the bedside, further discussing retrial with Zosyn .  He intermittently has rectal pain which he recently felt was better controlled until yesterday.  He awakened with rectal pain and recurrent yellow-green anal rectal discharge.  BMs are usually normal, sometimes strains.  He had loose stool on Sunday 08/2023.  No bloody stools or black stools.  He takes ibuprofen  200 mg 3 tabs once daily every other day for rectal pain and bodyaches.  He takes a stool  softener most days.  He does not tolerate MiraLAX , stated he had worse pain with BMs when he took it.  Last Ustekinumab  injection was 4 weeks ago, he is a few days late with his injection as delivery was delayed.   Patient was previously admitted to the hospital 7/19 - 03/16/2024 with progressive perianal and rectal pain.  Stelara  dose was increased to every 4 weeks 1 week prior to admission.  Pelvic MRI showed coccygeal osteomyelitis with erosion of the Kocsis, presacral abscesses and a seton was identified in the posterior perianal space.  Treated with Zosyn  IV. IR attempted CT-guided aspiration on 7/23 and left aspirate yielded only scant blood-tinged fluid, right aspiration was dry tap. Gram stain showed no organisms, showed moderate WBCs both neutrophils and mononuclear cells.  Cultures showed no growth.  Seen by general surgery who discussed a diverting colostomy, patient declined.  Patient was subsequently discharged home on Zosyn  IV for total of 6 weeks.  He endorsed completing Zosyn  for total of 6 weeks.  He does not recall having any follow-up pelvic imaging posttreatment.  GI PROCEDURES:   Colonoscopy 03/2023:  - Abnormal perianal exam fistula with seton in place.  - The examined portion of the ileum was normal. Biopsied.  - Crohn's disease with colonic involvement. Inflammation was found from the anus to the rectum. This was moderate in severity, improved compared to previous examinations. Biopsied.  - The examination was otherwise normal on direct and retroflexion views.   1. Surgical [P], small bowel,  ileum ILEAL MUCOSA WITH PRESERVED VILLOGLANDULAR ARCHITECTURE WITHOUT INCREASED INTRAEPITHELIAL LYMPHOCYTES OR EVIDENCE OF ACTIVE INFLAMMATION. NEGATIVE FOR DYSPLASIA OR MALIGNANCY. 2. Surgical [P], random colon sites COLONIC MUCOSA WITH NO SIGNIFICANT DIAGNOSTIC ALTERATION. NO EVIDENCE OF LYMPHOCYTIC COLITIS OR COLLAGENOUS COLITIS. NO EVIDENCE OF ACTIVITY, CHRONICITY, GRANULOMA, DYSPLASIA OR  MALIGNANCY. 3. Surgical [P], colon, rectum COLONIC MUCOSA WITH REACTIVE EPITHELIAL CHANGES AND LYMPHOID AGGREGATES. NEGATIVE FOR DYSPLASIA OR MALIGNANCY.   Colonoscopy 01/01/2021:  - The examined portion of the ileum was normal.   - Congested, indurated, friable mucosa in the rectum. Biopsied.  - The examination was otherwise normal on direct and retroflexion views.  RECTUM, BIOPSY:  -  Chronic colitis with focal activity  -  No granulomata, dysplasia or malignancy identified   IBD MEDICATION HISTORY:   Infliximab  -reported ineffective  Ustekinumab  (Stelara ) -induction 08/2022 with preliminary response but patient stopped medication prematurely; reinduction 11/2023  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   PO B12 initiated 12/31/20   Anxiety    Arthritis    Chronic kidney disease    Crohn's colitis (HCC)    Depression    Dilated cardiomyopathy (HCC) 05/18/2013   By catheterization 2014    Fistula    GERD (gastroesophageal reflux disease)    Hypertension    Neuromuscular disorder (HCC)    Osteomyelitis (HCC)    tailbone   Pneumonia     Past Surgical History:  Procedure Laterality Date   BIOPSY  01/01/2021   Procedure: BIOPSY;  Surgeon: Aneita Gwendlyn DASEN, MD;  Location: [facility] ENDOSCOPY;  Service: Endoscopy;;   COCCYGECTOMY  07/07/2021   COLONOSCOPY WITH PROPOFOL  N/A 01/01/2021   Procedure: COLONOSCOPY WITH PROPOFOL ;  Surgeon: Aneita Gwendlyn DASEN, MD;  Location: [facility] ENDOSCOPY;  Service: Endoscopy;  Laterality: N/A;   INCISION AND DRAINAGE ABSCESS N/A 07/07/2021   Procedure: MODIFIED HANLEY PROCEDURE, DRAINAGE WITH SETON PLACEMENT;  Surgeon: [name], MD;  Location: [facility] ORS;  Service: General;  Laterality: N/A;   INCISION AND DRAINAGE ABSCESS  05/04/2017   right groin   INCISION AND DRAINAGE PERIRECTAL ABSCESS  04/21/2021   INCISION AND DRAINAGE PERIRECTAL  ABSCESS  05/18/2013   LEFT AND RIGHT HEART CATHETERIZATION WITH CORONARY ANGIOGRAM N/A 11/21/2012   Procedure: LEFT AND RIGHT HEART CATHETERIZATION WITH CORONARY ANGIOGRAM;  Surgeon: Salena GORMAN Negri, MD;  Location: [facility] CATH LAB;  Service: Cardiovascular;  Laterality: N/A;   MANDIBLE FRACTURE SURGERY  08/23/2004   PLACEMENT OF SETON N/A 07/07/2021   Procedure: PLACEMENT OF SETON;  Surgeon: [name], MD;  Location: [facility] ORS;  Service: General;  Laterality: N/A;   PLACEMENT OF SETON N/A 01/15/2022   Procedure: PLACEMENT OF SETON;  Surgeon: [name], MD;  Location: [facility] ORS;  Service: General;  Laterality: N/A;   RECTAL EXAM UNDER ANESTHESIA N/A 01/15/2022   Procedure: RECTAL EXAM UNDER ANESTHESIA;  Surgeon: [name], MD;  Location: [facility] ORS;  Service: General;  Laterality: N/A;  TRANSRECTAL DRAINAGE REPLACEMENT OF SETON EXCISION OF PERINEAL SINUS TRACT ANORECTAL EXAM UNDER ANESTHESIA    Prior to Admission medications  Medication Sig Start Date End Date Taking? Authorizing Provider  acetaminophen  (TYLENOL ) 500 MG tablet Take 2 tablets (1,000 mg total) by mouth every 8 (eight) hours as needed for mild pain (pain score 1-3) or fever. 03/15/24  Yes Hongalgi, Anand D, MD  senna-docusate (SENOKOT-S) 8.6-50 MG tablet Take 2 tablets by mouth 2 (two)  times daily. 03/16/24  Yes Hongalgi, Anand D, MD  ustekinumab  (STELARA ) 90 MG/ML SOSY injection Inject 1 mL (90 mg total) into the skin every 28 (twenty-eight) days. 02/03/24  Yes McGreal, Inocente HERO, MD  bisacodyl  (DULCOLAX) 5 MG EC tablet Take 2 tablets (10 mg total) by mouth daily as needed for moderate constipation or mild constipation. Patient not taking: Reported on 09/18/2024 03/16/24 03/16/25  Hongalgi, Anand D, MD  polyethylene glycol (MIRALAX  / GLYCOLAX ) 17 g packet Take 17 g by mouth 3 (three) times daily. Patient not taking: Reported on 09/18/2024 03/16/24   Hongalgi, Anand D, MD  Vitamin D , Ergocalciferol , (DRISDOL ) 1.25 MG (50000 UNIT) CAPS capsule  Take 1 capsule (50,000 Units total) by mouth every 7 (seven) days. Patient not taking: No sig reported 01/18/24   [name] HERO, MD    Current Facility-Administered Medications  Medication Dose Route Frequency Provider Last Rate Last Admin   LORazepam  (ATIVAN ) injection 0.5 mg  0.5 mg Intravenous Once Jonnal, Aparna Hima, MD       LORazepam  (ATIVAN ) tablet 0.5 mg  0.5 mg Oral Once Jonnal, Aparna Hima, MD       Current Outpatient Medications  Medication Sig Dispense Refill   acetaminophen  (TYLENOL ) 500 MG tablet Take 2 tablets (1,000 mg total) by mouth every 8 (eight) hours as needed for mild pain (pain score 1-3) or fever.     senna-docusate (SENOKOT-S) 8.6-50 MG tablet Take 2 tablets by mouth 2 (two) times daily. 120 tablet 0   ustekinumab  (STELARA ) 90 MG/ML SOSY injection Inject 1 mL (90 mg total) into the skin every 28 (twenty-eight) days. 3 mL 3   bisacodyl  (DULCOLAX) 5 MG EC tablet Take 2 tablets (10 mg total) by mouth daily as needed for moderate constipation or mild constipation. (Patient not taking: Reported on 09/18/2024) 30 tablet 0   polyethylene glycol (MIRALAX  / GLYCOLAX ) 17 g packet Take 17 g by mouth 3 (three) times daily. (Patient not taking: Reported on 09/18/2024) 60 each 1   Vitamin D , Ergocalciferol , (DRISDOL ) 1.25 MG (50000 UNIT) CAPS capsule Take 1 capsule (50,000 Units total) by mouth every 7 (seven) days. (Patient not taking: No sig reported) 8 capsule 0   Facility-Administered Medications Ordered in Other Encounters  Medication Dose Route Frequency Provider Last Rate Last Admin   Chlorhexidine  Gluconate Cloth 2 % PADS 6 each  6 each Topical Once Gross, Elspeth, MD       And   Chlorhexidine  Gluconate Cloth 2 % PADS 6 each  6 each Topical Once Gross, Elspeth, MD       feeding supplement (ENSURE PRE-SURGERY) liquid 296 mL  296 mL Oral Once Sheldon Elspeth, MD        Allergies as of 09/18/2024 - Review Complete 09/18/2024  Allergen Reaction Noted   Shrimp [shellfish  allergy] Shortness Of Breath 05/16/2013   Nsaids Other (See Comments) 07/06/2021   Bactoshield chg [chlorhexidine  gluconate] Itching 03/15/2024    Family History  Problem Relation Age of Onset   Diabetes Mother    Kidney failure Mother    Diabetes Sister    Colon cancer Neg Hx    Stomach cancer Neg Hx    Esophageal cancer Neg Hx    Pancreatic disease Neg Hx    Colon polyps Neg Hx    Rectal cancer Neg Hx     Social History   Socioeconomic History   Marital status: Single    Spouse name: Not on file   Number of children: Not on  file   Years of education: Not on file   Highest education level: Associate degree: academic program  Occupational History   Not on file  Tobacco Use   Smoking status: Former    Current packs/day: 0.25    Types: Cigarettes   Smokeless tobacco: Never  Vaping Use   Vaping status: Never Used  Substance and Sexual Activity   Alcohol use: Not Currently    Comment: occ   Drug use: No   Sexual activity: Not on file  Other Topics Concern   Not on file  Social History Narrative   Single - no kids   No tobacco, drugs, alcohol   Front Desk agent [employer]   Social Drivers of Health   Tobacco Use: Medium Risk (08/20/2024)   Patient History    Smoking Tobacco Use: Former    Smokeless Tobacco Use: Never    Passive Exposure: Not on file  Financial Resource Strain: Low Risk (09/18/2023)   Overall Financial Resource Strain (CARDIA)    Difficulty of Paying Living Expenses: Not hard at all  Food Insecurity: Food Insecurity Present (03/11/2024)   Epic    Worried About Programme Researcher, Broadcasting/film/video in the Last Year: Sometimes true    Ran Out of Food in the Last Year: Sometimes true  Transportation Needs: No Transportation Needs (03/11/2024)   Epic    Lack of Transportation (Medical): No    Lack of Transportation (Non-Medical): No  Physical Activity: Sufficiently Active (09/18/2023)   Exercise Vital Sign    Days of Exercise per Week: 7 days    Minutes of  Exercise per Session: 30 min  Stress: Stress Concern Present (09/18/2023)   [employer] of Occupational Health - Occupational Stress Questionnaire    Feeling of Stress : Very much  Social Connections: Socially Isolated (03/11/2024)   Social Connection and Isolation Panel    Frequency of Communication with Friends and Family: Three times a week    Frequency of Social Gatherings with Friends and Family: Never    Attends Religious Services: Never    Database Administrator or Organizations: No    Attends Banker Meetings: Never    Marital Status: Never married  Intimate Partner Violence: Not At Risk (03/11/2024)   Epic    Fear of Current or Ex-Partner: No    Emotionally Abused: No    Physically Abused: No    Sexually Abused: No  Depression (PHQ2-9): Medium Risk (08/20/2024)   Depression (PHQ2-9)    PHQ-2 Score: 6  Alcohol Screen: Low Risk (09/18/2023)   Alcohol Screen    Last Alcohol Screening Score (AUDIT): 3  Housing: High Risk (03/11/2024)   Epic    Unable to Pay for Housing in the Last Year: Yes    Number of Times Moved in the Last Year: 0    Homeless in the Last Year: No  Utilities: At Risk (03/11/2024)   Epic    Threatened with loss of utilities: Yes  Health Literacy: Not on file    Review of Systems: Gen: Denies fever, sweats or chills. No weight loss.  CV: Denies chest pain, palpitations or edema. Resp: Denies cough, shortness of breath of hemoptysis.  GI: See HPI.  GU: Denies urinary burning, blood in urine, increased urinary frequency or incontinence. MS: Denies joint pain, muscles aches or weakness. Derm: Denies rash, itchiness, skin lesions or unhealing ulcers. Psych: Denies depression, anxiety, memory loss or confusion. Heme: Denies easy bruising, bleeding. Neuro:  Denies headaches, dizziness or  paresthesias. Endo:  Denies any problems with DM, thyroid  or adrenal function.  Physical Exam: Vital signs in last 24 hours: Temp:  [98.4 F (36.9 C)]  98.4 F (36.9 C) (01/27 0935) Pulse Rate:  [73] 73 (01/27 0935) Resp:  [18] 18 (01/27 0935) BP: (125)/(76) 125/76 (01/27 0935) SpO2:  [99 %] 99 % (01/27 0935)   General: Alert fatigued appearing 39 year old male in no acute distress. Head:  Normocephalic and atraumatic. Eyes:  No scleral icterus. Conjunctiva pink. Ears:  Normal auditory acuity. Nose:  No deformity, discharge or lesions. Mouth:  Dentition intact. No ulcers or lesions.  Neck:  Supple. No lymphadenopathy or thyromegaly.  Lungs: Breath sounds clear throughout. No wheezes, rhonchi or crackles.  Heart: Regular rate and rhythm, no murmurs. Abdomen: Soft, nondistended.  Nontender.  Positive bowel sounds to all 4 quadrants. Rectal: No external hemorrhoids or fissures.  Large open fistula to posterior anal area with seton which extends inside the anorectum. Perianal area with watery cloudy discharge. Limited rectal exam, no palpable mass within reach.  No blood.  Dr. Fleeta Rothman and ED tech at the bedside. Photo obtained per Dr .Lindia.   Musculoskeletal:  Symmetrical without gross deformities.  Pulses:  Normal pulses noted. Extremities:  Without clubbing or edema. Neurologic:  Alert and  oriented x 4. No focal deficits.  Skin:  Intact without significant lesions or rashes. Psych:  Alert and cooperative. Normal mood and affect.  Intake/Output from previous day: No intake/output data recorded. Intake/Output this shift: Total I/O In: 1041.6 [IV Piggyback:1041.6] Out: -   Lab Results: Recent Labs    09/18/24 0955  WBC 15.2*  HGB 11.9*  HCT 35.8*  PLT 301   BMET Recent Labs    09/18/24 0955  NA 136  K 3.7  CL 103  CO2 23  GLUCOSE 102*  BUN 19  CREATININE 1.53*  CALCIUM  8.7*   LFT Recent Labs    09/18/24 0955  PROT 7.8  ALBUMIN  3.5  AST 18  ALT 13  ALKPHOS 134*  BILITOT 0.4   PT/INR No results for input(s): LABPROT, INR in the last 72 hours. Hepatitis Panel No results for input(s): HEPBSAG,  HCVAB, HEPAIGM, HEPBIGM in the last 72 hours.    Studies/Results: CT ABDOMEN PELVIS W CONTRAST Result Date: 09/18/2024 CLINICAL DATA:  Crohn's exacerbation. Concern for anal fistula. Symptoms for 24 hours. EXAM: CT ABDOMEN AND PELVIS WITH CONTRAST TECHNIQUE: Multidetector CT imaging of the abdomen and pelvis was performed using the standard protocol following bolus administration of intravenous contrast. RADIATION DOSE REDUCTION: This exam was performed according to the departmental dose-optimization program which includes automated exposure control, adjustment of the mA and/or kV according to patient size and/or use of iterative reconstruction technique. CONTRAST:  OMNIPAQUE  IOHEXOL  300 MG/ML  SOLN COMPARISON:  03/19/2024 CT and MRI. FINDINGS: Lower chest: Volume loss and subsegmental atelectasis in the dependent right lung. Normal heart size without pericardial or pleural effusion. Mild to moderate right hemidiaphragm elevation. Fluid or debris within the distal esophagus including on image 9/12. Hepatobiliary: Normal liver. Normal gallbladder, without biliary ductal dilatation. Pancreas: Normal, without mass or ductal dilatation. Spleen: Normal in size, without focal abnormality. Adrenals/Urinary Tract: Normal adrenal glands. Bilateral hypoattenuating renal lesions. Larger lesions, including up to 4.1 cm, most consistent with cysts and minimally complex cysts. Other lesions are too small to characterize but most likely cysts. No follow-up indicated. No hydronephrosis.  Normal urinary bladder. Stomach/Bowel: Normal stomach, without wall thickening. Again identified is a midline seton.  Mild asymmetric left rectal wall thickening including on image 83/3. Mildly improved since comparison CT. Trace presacral gas, decreased on image 75/3. The extent of ill-defined fluid and edema within the midline and left ischioanal fossa is decreased. Persistent relatively similar small volume gas including on  image 81/3. Normal terminal ileum and appendix. Normal small bowel. Vascular/Lymphatic: Normal aortic caliber. Perirectal adenopathy including at 1.0 cm on 74/3. Increased from 7 mm on the prior. Reproductive: Normal prostate. Other: No abdominopelvic fluid or extraluminal gas. Small fat containing right inguinal hernia. Musculoskeletal: No acute osseous abnormality. IMPRESSION: Persistent, improved asymmetric left rectal wall thickening, favoring proctitis. Improved presacral gas and ill-defined fluid/gas within the eccentric left ischioanal fossa. Findings likely are secondary to a perianal fistula, which is not entirely evaluated on CT. If further imaging characterization is desired, the test of choice is high-resolution pre and post-contrast pelvic MRI. No evidence of terminal ileal or more proximal colonic Crohn disease. Esophageal air fluid level suggests dysmotility or gastroesophageal reflux. Perirectal adenopathy, increased and favored to be reactive. Electronically Signed   By: Rockey Kilts M.D.   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  WBC 15.2. CTAP with contrast showed evidence of a perianal fistula which was not completely evaluated per CT and left rectal wall thickening suggestive of proctitis was noted.  Pelvic MRI W/WO contrast completed, results pending.  Received Zosyn  IV x 1. - Await pelvic MRI result - ID consult to manage IV antibiotics - Likely will require colorectal surgery consult - Clear liquid diet - IV fluids and pain management per the hospitalist - Clear liquid diet for now - Patient instructed to avoid ibuprofen /NSAIDs - Await further recommendations per Dr. Federico  Mild anemia, suspect  secondary to chronic disease plus B12 deficiency - CBC in am  CKD stage II  Elida CHRISTELLA Shawl  09/18/2024, 4:19 PM  I have taken a history, reviewed the chart and examined the patient. I performed a substantive portion of this encounter (greater than 50%), including complete performance of at least one of the key components, in conjunction with the APP. I agree with the APP's note, impression and recommendations with additional input as follows.  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  He states that this pain and drainage worsened yesterday.  He does typically note worsening of his symptoms just before his next dose of Stelara  is due, but this was particularly worse than normal.  He was started on Ustekinumab  (Stelara ) 11/2023 and has been compliant with his Stelara  every 4 weeks.  His last dose of Stelara  was due a few days ago but this was delayed as a result of the delivery issues.  His Stelara  is supposed to arrive today.  Endorses chills.  Denies fevers.  He states that he also feels pain in his tailbone.  Denies any blood in his stools.  Denies diarrhea.  He believes that he has a recurrent perianal abscess.  The last time he was on antibiotics was a few months ago.  He had a seton placed back in 2022.  Patient  presents with significant leukocytosis as well as elevated inflammatory markers, suspected to be due to uncontrolled infection.  CT A/P with contrast showed some signs of asymmetric proctitis as well as presacral gas and ill-defined fluid/gas that are likely secondary to perianal fistula. Suspect that his proctitis is likely related to inflammation from his perianal abscesses so he is unlikely to benefit from steroid therapy. MRI pelvis  was done that shows a perianal fistula with branches extending into additional abscesses that appear slightly improved but persistent.  Would want colorectal surgery to be involved to see if he needs any additional surgical drainage.  They previously offered him a diverting colostomy, which he was not interested in.  MRI also shows signs of chronic osteomyelitis.  ID is already on board to help with antibiotics management and states that they would appreciate any further culture information that is able to be obtained.  Will follow-up blood cultures.  If surgical drainage is pursued, then may be able to get additional cultures of the abscesses to guide antibiotic. Previously CT guided drainage only yielded scant fluid and fluid cultures did not grow out any organism.  Estefana Kidney, MD  "

## 2024-09-18 NOTE — H&P (Addendum)
 " History and Physical    Patient: Shawn [name] FMW:[id number] DOB: [birth year] DOA: 09/18/2024 DOS: the patient was seen and examined on 09/18/2024 PCP: [name], FNP  Patient coming from: Home  Chief Complaint:  Chief Complaint  Patient presents with   Abscess   HPI: Shawn [name] is a 39 y.o. male with medical history significant of Crohn's disease, history of perirectal abscesses, B12 deficiency, dilated cardiomyopathy, hypertension who presented yesterday afternoon for surgery of perirectal fistula and high perirectal anal abscess who presented to emergency department with complaints of having an abscess in his perianal area. He denied fever, chills, rhinorrhea, sore throat, wheezing or hemoptysis.  No chest pain, palpitations, diaphoresis, PND, orthopnea or pitting edema of the lower extremities.  No abdominal pain, nausea, emesis, diarrhea, constipation, melena or hematochezia.  No flank pain, dysuria, frequency or hematuria.  No polyuria, polydipsia, polyphagia or blurred vision.   Lab work: CBC showed white count of 15.2, hemoglobin 11.9 g/dL and platelets 698.  Sed rate was 58 mm/h.  CMP showed normal electrolytes after calcium  correction, alk phos was slightly elevated at 134 units/L, but the rest of the LFTs were normal.  Glucose 102, BUN 19 and creatinine 1.53 mg/dL.  Imaging: CT abdomen/pelvis with contrast show persistent, but improved asymmetric left rectal wall thickening, favoring proctitis.  Improved presacral gas and ill-defined fluid/gas within the eccentric left ischioanal fossa.  Findings likely are secondary to perianal fistula, which is not entirely evaluated on CT.  If further imaging characterization is desired high resolution pre and postcontrast pelvic MRI could be helpful.  No evidence of terminal ileal or more proximal colonic Crohn's disease.  Esophageal air-fluid level suggest dysmotility or GERD.  Perirectal adenopathy, increased and favored to be  reactive.  ED course: Initial vital signs were temperature 98.4 F, pulse 73, respiration 18, BP 125/76 mmHg O2 sat 99% on room air.  The patient received hydromorphone  1 mg IVP x 2, LR 1000 mL liter bolus, lorazepam  0.5 mg p.o. x 1, lorazepam  0.5 mg IVP x 1, ondansetron  4 mg IVP and Zosyn  3.375 g IVPB x 1.   Review of Systems: As mentioned in the history of present illness. All other systems reviewed and are negative. 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   PO B12 initiated 12/31/20   Anxiety    Arthritis    Chronic kidney disease    Crohn's colitis (HCC)    Depression    Dilated cardiomyopathy (HCC) 05/18/2013   By catheterization 2014    Fistula    GERD (gastroesophageal reflux disease)    Hypertension    Neuromuscular disorder (HCC)    Osteomyelitis (HCC)    tailbone   Pneumonia    Past Surgical History:  Procedure Laterality Date   BIOPSY  01/01/2021   Procedure: BIOPSY;  Surgeon: Aneita Gwendlyn DASEN, MD;  Location: [facility] ENDOSCOPY;  Service: Endoscopy;;   COCCYGECTOMY  07/07/2021   COLONOSCOPY WITH PROPOFOL  N/A 01/01/2021   Procedure: COLONOSCOPY WITH PROPOFOL ;  Surgeon: Aneita Gwendlyn DASEN, MD;  Location: [facility] ENDOSCOPY;  Service: Endoscopy;  Laterality: N/A;   INCISION AND DRAINAGE ABSCESS N/A 07/07/2021   Procedure: MODIFIED HANLEY PROCEDURE, DRAINAGE WITH SETON PLACEMENT;  Surgeon: [name], MD;  Location: [facility] ORS;  Service: General;  Laterality: N/A;   INCISION AND DRAINAGE ABSCESS  05/04/2017   right groin   INCISION AND DRAINAGE  PERIRECTAL ABSCESS  04/21/2021   INCISION AND DRAINAGE PERIRECTAL ABSCESS  05/18/2013   LEFT AND RIGHT HEART CATHETERIZATION WITH CORONARY ANGIOGRAM N/A 11/21/2012   Procedure: LEFT AND RIGHT HEART CATHETERIZATION WITH CORONARY ANGIOGRAM;  Surgeon: Salena GORMAN Negri, MD;  Location: [facility] CATH LAB;  Service: Cardiovascular;   Laterality: N/A;   MANDIBLE FRACTURE SURGERY  08/23/2004   PLACEMENT OF SETON N/A 07/07/2021   Procedure: PLACEMENT OF SETON;  Surgeon: [name], MD;  Location: [facility] ORS;  Service: General;  Laterality: N/A;   PLACEMENT OF SETON N/A 01/15/2022   Procedure: PLACEMENT OF SETON;  Surgeon: [name], MD;  Location: [facility] ORS;  Service: General;  Laterality: N/A;   RECTAL EXAM UNDER ANESTHESIA N/A 01/15/2022   Procedure: RECTAL EXAM UNDER ANESTHESIA;  Surgeon: [name], MD;  Location: [facility] ORS;  Service: General;  Laterality: N/A;  TRANSRECTAL DRAINAGE REPLACEMENT OF SETON EXCISION OF PERINEAL SINUS TRACT ANORECTAL EXAM UNDER ANESTHESIA   Social History:  reports that he has quit smoking. His smoking use included cigarettes. He has never used smokeless tobacco. He reports that he does not currently use alcohol. He reports that he does not use drugs.  Allergies[1]  Family History  Problem Relation Age of Onset   Diabetes Mother    Kidney failure Mother    Diabetes Sister    Colon cancer Neg Hx    Stomach cancer Neg Hx    Esophageal cancer Neg Hx    Pancreatic disease Neg Hx    Colon polyps Neg Hx    Rectal cancer Neg Hx     Prior to Admission medications  Medication Sig Start Date End Date Taking? Authorizing Provider  acetaminophen  (TYLENOL ) 500 MG tablet Take 2 tablets (1,000 mg total) by mouth every 8 (eight) hours as needed for mild pain (pain score 1-3) or fever. 03/15/24  Yes Hongalgi, Anand D, MD  senna-docusate (SENOKOT-S) 8.6-50 MG tablet Take 2 tablets by mouth 2 (two) times daily. 03/16/24  Yes Hongalgi, Anand D, MD  ustekinumab  (STELARA ) 90 MG/ML SOSY injection Inject 1 mL (90 mg total) into the skin every 28 (twenty-eight) days. 02/03/24  Yes McGreal, Inocente HERO, MD  bisacodyl  (DULCOLAX) 5 MG EC tablet Take 2 tablets (10 mg total) by mouth daily as needed for moderate constipation or mild constipation. Patient not taking: Reported on 09/18/2024 03/16/24 03/16/25  Hongalgi,  Anand D, MD  polyethylene glycol (MIRALAX  / GLYCOLAX ) 17 g packet Take 17 g by mouth 3 (three) times daily. Patient not taking: Reported on 09/18/2024 03/16/24   Hongalgi, Anand D, MD  Vitamin D , Ergocalciferol , (DRISDOL ) 1.25 MG (50000 UNIT) CAPS capsule Take 1 capsule (50,000 Units total) by mouth every 7 (seven) days. Patient not taking: No sig reported 01/18/24   [name] HERO, MD    Physical Exam: Vitals:   09/18/24 0935 09/18/24 1423  BP: 125/76 (!) 142/82  Pulse: 73 75  Resp: 18 17  Temp: 98.4 F (36.9 C) 98.1 F (36.7 C)  TempSrc: Oral Oral  SpO2: 99% 97%   Physical Exam Vitals and nursing note reviewed.  Constitutional:      General: He is awake. He is not in acute distress.    Appearance: He is ill-appearing.  HENT:     Head: Normocephalic.     Nose: No rhinorrhea.     Mouth/Throat:     Mouth: Mucous membranes are moist.  Eyes:     General: No scleral icterus.    Pupils: Pupils are equal, round, and  reactive to light.  Neck:     Vascular: No JVD.  Cardiovascular:     Rate and Rhythm: Normal rate and regular rhythm.     Heart sounds: S1 normal and S2 normal.  Pulmonary:     Effort: Pulmonary effort is normal.     Breath sounds: Normal breath sounds. No wheezing, rhonchi or rales.  Abdominal:     General: Bowel sounds are normal. There is no distension.     Palpations: Abdomen is soft.     Tenderness: There is no abdominal tenderness. There is no right CVA tenderness or left CVA tenderness.  Musculoskeletal:     Cervical back: Neck supple.     Right lower leg: No edema.     Left lower leg: No edema.  Skin:    General: Skin is warm and dry.  Neurological:     General: No focal deficit present.     Mental Status: He is alert and oriented to person, place, and time.  Psychiatric:        Mood and Affect: Mood normal.        Behavior: Behavior normal. Behavior is cooperative.     Data Reviewed:  Results are pending, will review when  available.  12/19/2020 echocardiogram report.  IMPRESSIONS:   1. Left ventricular ejection fraction, by estimation, is 65 to 70%. The  left ventricle has normal function. The left ventricle has no regional  wall motion abnormalities. There is moderate left ventricular hypertrophy.  Left ventricular diastolic  parameters were normal.   2. Right ventricular systolic function is normal. The right ventricular  size is normal. Tricuspid regurgitation signal is inadequate for assessing  PA pressure.   3. The mitral valve is normal in structure. No evidence of mitral valve  regurgitation. No evidence of mitral stenosis.   4. The aortic valve is tricuspid. Aortic valve regurgitation is not  visualized. No aortic stenosis is present.   5. The inferior vena cava is normal in size with greater than 50%  respiratory variability, suggesting right atrial pressure of 3 mmHg.   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 Continue Zosyn  3.375 g IVPB every 8 hours. Analgesics as needed. Antiemetics as needed. Follow CBC, CMP in AM. GI consult appreciated. Will follow the recommendations. Infectious diseases consult appreciated. - Recommended general surgery consult. --Will call in AM.  Active Problems:   Hypertension Currently normotensive. Not taking antihypertensives regularly. May order as needed antihypertensives    CKD (chronic kidney disease) stage 3a,    GFR 45 -59 ml/min (HCC) Monitor renal function and electrolytes.    Chronic disease anemia Monitor monitor hemoglobin. Transfuse as needed.     Thrombocytosis In the setting of anemia and infection. Continue to monitor platelet count daily.    Advance Care Planning:   Code Status: Full Code   Consults: [place] gastroenterology  and infectious diseases.  Family Communication:   Severity of Illness: The appropriate patient status for this patient is OBSERVATION. Observation status is judged to be reasonable and necessary in order to provide the required intensity of service to ensure the patient's safety. The patient's presenting symptoms, physical exam findings, and initial radiographic and laboratory data in the context of their medical condition is felt to place them at decreased risk for further clinical deterioration. Furthermore, it is anticipated that  the patient will be medically stable for discharge from the hospital within 2 midnights of admission.   Author: Alm Dorn Castor, MD 09/18/2024 2:27 PM  For on call review [email or web address].   This document was prepared using Dragon voice recognition software and may contain some unintended transcription errors.     [1]  Allergies Allergen Reactions   Shrimp [Shellfish Allergy] Shortness Of Breath   Nsaids Other (See Comments)    CROHN'S DISEASE = NO NSAIDS   Bactoshield Chg [Chlorhexidine  Gluconate] Itching   "

## 2024-09-19 ENCOUNTER — Encounter (HOSPITAL_COMMUNITY): Payer: Self-pay | Admitting: Internal Medicine

## 2024-09-19 DIAGNOSIS — K603 Anal fistula, unspecified: Secondary | ICD-10-CM | POA: Diagnosis not present

## 2024-09-19 DIAGNOSIS — N182 Chronic kidney disease, stage 2 (mild): Secondary | ICD-10-CM | POA: Diagnosis not present

## 2024-09-19 DIAGNOSIS — K610 Anal abscess: Secondary | ICD-10-CM | POA: Diagnosis not present

## 2024-09-19 DIAGNOSIS — R933 Abnormal findings on diagnostic imaging of other parts of digestive tract: Secondary | ICD-10-CM | POA: Diagnosis not present

## 2024-09-19 DIAGNOSIS — D638 Anemia in other chronic diseases classified elsewhere: Secondary | ICD-10-CM | POA: Diagnosis not present

## 2024-09-19 DIAGNOSIS — E538 Deficiency of other specified B group vitamins: Secondary | ICD-10-CM | POA: Diagnosis not present

## 2024-09-19 DIAGNOSIS — K50113 Crohn's disease of large intestine with fistula: Secondary | ICD-10-CM | POA: Diagnosis not present

## 2024-09-19 LAB — CBC
HCT: 35.3 % — ABNORMAL LOW (ref 39.0–52.0)
Hemoglobin: 11.7 g/dL — ABNORMAL LOW (ref 13.0–17.0)
MCH: 26.7 pg (ref 26.0–34.0)
MCHC: 33.1 g/dL (ref 30.0–36.0)
MCV: 80.4 fL (ref 80.0–100.0)
Platelets: 288 10*3/uL (ref 150–400)
RBC: 4.39 MIL/uL (ref 4.22–5.81)
RDW: 16.6 % — ABNORMAL HIGH (ref 11.5–15.5)
WBC: 10.6 10*3/uL — ABNORMAL HIGH (ref 4.0–10.5)
nRBC: 0 % (ref 0.0–0.2)

## 2024-09-19 LAB — COMPREHENSIVE METABOLIC PANEL WITH GFR
ALT: 24 U/L (ref 0–44)
AST: 26 U/L (ref 15–41)
Albumin: 3.2 g/dL — ABNORMAL LOW (ref 3.5–5.0)
Alkaline Phosphatase: 116 U/L (ref 38–126)
Anion gap: 11 (ref 5–15)
BUN: 14 mg/dL (ref 6–20)
CO2: 23 mmol/L (ref 22–32)
Calcium: 8.5 mg/dL — ABNORMAL LOW (ref 8.9–10.3)
Chloride: 105 mmol/L (ref 98–111)
Creatinine, Ser: 1.78 mg/dL — ABNORMAL HIGH (ref 0.61–1.24)
GFR, Estimated: 49 mL/min — ABNORMAL LOW
Glucose, Bld: 87 mg/dL (ref 70–99)
Potassium: 4.2 mmol/L (ref 3.5–5.1)
Sodium: 139 mmol/L (ref 135–145)
Total Bilirubin: 0.6 mg/dL (ref 0.0–1.2)
Total Protein: 7 g/dL (ref 6.5–8.1)

## 2024-09-19 LAB — C-REACTIVE PROTEIN: CRP: 15.1 mg/dL — ABNORMAL HIGH

## 2024-09-19 LAB — SEDIMENTATION RATE: Sed Rate: 72 mm/h — ABNORMAL HIGH (ref 0–16)

## 2024-09-19 MED ORDER — DIPHENHYDRAMINE HCL 25 MG PO CAPS
25.0000 mg | ORAL_CAPSULE | Freq: Once | ORAL | Status: AC
  Administered 2024-09-19: 25 mg via ORAL
  Filled 2024-09-19: qty 1

## 2024-09-19 MED ORDER — OXYCODONE HCL 5 MG PO TABS
5.0000 mg | ORAL_TABLET | ORAL | Status: DC | PRN
  Administered 2024-09-19 – 2024-09-20 (×3): 5 mg via ORAL
  Filled 2024-09-19 (×3): qty 1

## 2024-09-19 MED ORDER — DIPHENHYDRAMINE HCL 50 MG/ML IJ SOLN
25.0000 mg | Freq: Once | INTRAMUSCULAR | Status: AC
  Administered 2024-09-19: 25 mg via INTRAVENOUS
  Filled 2024-09-19: qty 1

## 2024-09-19 NOTE — Plan of Care (Signed)
   Problem: Education: Goal: Knowledge of General Education information will improve Description: Including pain rating scale, medication(s)/side effects and non-pharmacologic comfort measures Outcome: Progressing   Problem: Activity: Goal: Risk for activity intolerance will decrease Outcome: Progressing   Problem: Nutrition: Goal: Adequate nutrition will be maintained Outcome: Progressing   Problem: Coping: Goal: Level of anxiety will decrease Outcome: Progressing

## 2024-09-19 NOTE — Progress Notes (Signed)
 " PROGRESS NOTE    Shawn [name]  FMW:[id number] DOB: Jun 27, 1986 DOA: 09/18/2024 PCP: [name], FNP   Brief Narrative:  This 39 years old male with PMH significant for Crohn's disease, history of perirectal abscess, B12 deficiency, dilated cardiomyopathy, hypertension presented in the ED with complaints of having an abscess in the perianal area.  Patient denies any fever, chills, other symptoms.  Workup in the ED reveals WBC 15.2, sed rate 58, CMP shows normal electrolytes, elevated AST and ALT.  Serum creatinine 1.53. CT A/P showed persistent, but improved asymmetric left rectal wall thickening, favoring proctitis. Improved presacral gas and ill-defined fluid/gas within the eccentric left ischioanal fossa. Findings likely are secondary to perianal fistula, which is not entirely evaluated on CT. No evidence of terminal ileal or more proximal colonic Crohn's disease. Esophageal air-fluid level suggest dysmotility or GERD. Perirectal adenopathy, increased and favored to be reactive.  Patient admitted for further evaluation initiated on IV antibiotics.  General Surgery and gastroenterology is consulted.  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 CT A/P shows findings consistent with perianal fistula. Patient initiated on IV Zosyn  3.375 g every 8 hours. Continue adequate pain control with pain medication. Continue antiemetics as needed. GI, general surgery and ID is consulted. ID  consult appreciated,  recommended to continue Zosyn .  Would hold Biologics in near future. GI recommended to start clear liquid diet,  IV pain medications and pain control. General surgery consult is pending.  Essential hypertension: Blood pressure is normotensive. Not taking any antihypertensive medications.  CKD stage IIIa: Serum creatinine at baseline. Avoid nephrotoxic medications.  Chronic disease anemia: Monitor H&H.  No obvious visible bleeding.  Thrombocytosis : > Improved. likely in the setting of anemia and infection.   Continue to monitor platelet count.   DVT prophylaxis: SCDs Code Status: Full code Family Communication: No family at bed side. Disposition Plan:     Status is: Inpatient Remains inpatient appropriate because: Patient admitted with rectal pain found to have perirectal abscess with fistula.  General surgery,  ID and GI is consulted.  Patient is not medically ready for discharge.    Consultants:  GI General Surgery ID  Procedures: MRI pelvis Antimicrobials:  Anti-infectives (From admission, onward)    Start     Dose/Rate Route Frequency Ordered Stop   09/18/24 1830  piperacillin -tazobactam (ZOSYN ) IVPB 3.375 g        3.375 g 12.5 mL/hr over 240 Minutes Intravenous Every 8 hours 09/18/24 1610     09/18/24 0945  piperacillin -tazobactam (ZOSYN ) IVPB 3.375 g        3.375 g 100 mL/hr over 30 Minutes Intravenous  Once 09/18/24 0944 09/18/24 1103      Subjective: Patient was seen and examined at bedside.  Overnight events noted. Patient reports having significant pain in the rectal area. Denies any bleeding.   Objective: Vitals:   09/18/24 1824 09/18/24 2227 09/19/24 0226 09/19/24 0528  BP: 123/76 123/76 120/78 127/71  Pulse: 90 78 87 73  Resp: 18 18 18  18  Temp: 100.2 F (37.9 C) 99 F (37.2 C) (!) 100.8 F (38.2 C) (!) 100.5 F (38.1 C)  TempSrc:      SpO2: 96% 98% 95% 96%    Intake/Output Summary (Last 24 hours) at 09/19/2024  1153 Last data filed at 09/19/2024 0950 Gross per 24 hour  Intake 1340 ml  Output --  Net 1340 ml   There were no vitals filed for this visit.  Examination:  General exam: Appears calm and comfortable, not in any acute distress. Respiratory system: CTA Bilaterally . Respiratory effort normal.  [id number] Cardiovascular system: S1 & S2 heard, RRR. No JVD, murmurs, rubs, gallops or clicks. Gastrointestinal system: Abdomen is non distended, soft and non tender.  Normal bowel sounds heard. Central nervous system: Alert and oriented x 3. No focal neurological deficits. Extremities: No edema, no cyanosis, no clubbing. Skin: No rashes, lesions or ulcers Psychiatry: Judgement and insight appear normal. Mood & affect appropriate.   Data Reviewed: I have personally reviewed following labs and imaging studies  CBC: Recent Labs  Lab 09/18/24 0955 09/19/24 0625  WBC 15.2* 10.6*  NEUTROABS 10.6*  --   HGB 11.9* 11.7*  HCT 35.8* 35.3*  MCV 80.4 80.4  PLT 301 288   Basic Metabolic Panel: Recent Labs  Lab 09/18/24 0955 09/19/24 0625  NA 136 139  K 3.7 4.2  CL 103 105  CO2 23 23  GLUCOSE 102* 87  BUN 19 14  CREATININE 1.53* 1.78*  CALCIUM  8.7* 8.5*   GFR: CrCl cannot be calculated (Unknown ideal weight.). Liver Function Tests: Recent Labs  Lab 09/18/24 0955 09/19/24 0625  AST 18 26  ALT 13 24  ALKPHOS 134* 116  BILITOT 0.4 0.6  PROT 7.8 7.0  ALBUMIN  3.5 3.2*   No results for input(s): LIPASE, AMYLASE in the last 168 hours. No results for input(s): AMMONIA in the last 168 hours. Coagulation Profile: No results for input(s): INR, PROTIME in the last 168 hours. Cardiac Enzymes: No results for input(s): CKTOTAL, CKMB, CKMBINDEX, TROPONINI in the last 168 hours. BNP (last 3 results) No results for input(s): PROBNP in the last 8760 hours. HbA1C: No results for input(s): HGBA1C in the last 72 hours. CBG: No results for input(s): GLUCAP in the last 168  hours. Lipid Profile: No results for input(s): CHOL, HDL, LDLCALC, TRIG, CHOLHDL, LDLDIRECT in the last 72 hours. Thyroid  Function Tests: No results for input(s): TSH, T4TOTAL, FREET4, T3FREE, THYROIDAB in the last 72 hours. Anemia Panel: No results for input(s): VITAMINB12, FOLATE, FERRITIN, TIBC, IRON , RETICCTPCT in the last 72 hours. Sepsis Labs: No results for input(s): PROCALCITON, LATICACIDVEN in the last 168 hours.  Recent Results (from the past 240 hours)  Culture, blood (routine x 2)     Status: None (Preliminary result)   Collection Time: 09/18/24 10:10 AM   Specimen: BLOOD  Result Value Ref Range Status   Specimen Description   Final    BLOOD LEFT ANTECUBITAL Performed at [facility], [address] [place], [place] [address]    Special Requests   Final    BOTTLES DRAWN AEROBIC AND ANAEROBIC Blood Culture adequate volume Performed at [facility], [address], [place], [place] [address]    Culture   Final    NO GROWTH < 24 HOURS Performed at [facility], [address], [place], [place] [address]    Report Status PENDING  Incomplete  Culture, blood (routine x 2)     Status: None (Preliminary  result)   Collection Time: 09/18/24 10:20 AM   Specimen: BLOOD  Result Value Ref Range Status   Specimen Description   Final    BLOOD RIGHT ANTECUBITAL Performed at [facility], [address], [place], [place] [address]    Special Requests   Final    BOTTLES DRAWN AEROBIC AND ANAEROBIC Blood Culture adequate volume Performed at [facility], [address], [place], [place] [address]    Culture   Final    NO GROWTH < 24 HOURS Performed at [facility], [address], [place], [place] [address]    Report Status PENDING  Incomplete         Radiology Studies: MR PELVIS W WO CONTRAST Result Date: 09/18/2024 EXAM: MRI OF THE PELVIS WITH  AND WITHOUT INTRAVENOUS CONTRAST 09/18/2024 03:24:34 PM TECHNIQUE: Multiplanar magnetic resonance images of the pelvis with and without intravenous contrast. 9 mL (gadobutrol  (GADAVIST ) 1 MMOL/ML injection 9 mL GADOBUTROL  1 MMOL/ML IV SOLN). COMPARISON: 03/11/2024. CLINICAL HISTORY: Crohn disease with perianal region fistula. FINDINGS: SOFT TISSUES: Mild perirectal stranding. Posterior perianal fistula at the 6 o'clock position originating from the lower anal canal (image 26 of series 10) and extending cephalad at the 6 o'clock position into the presacral space. Reduced size of the presacral abscess, currently measuring 5.6 x 2.6 x 0.8 cm (volume = 6 cm\S\3), previously measuring 6.7 x 1.1 x 4.5 cm (volume = 17 cm\S\3). Previously associated extension through the sacrococcygeal foramina is improved although not completely resolved, with inflammatory stranding and enhancement extending along the lower sacral and sacrococcygeal foramina. There is also a small amount of abscess extension below the left piriformis muscle and deep to the left gluteus maximus muscle with this particular loculation measuring 2.6 x 1.7 x 3.1 cm (volume = 7.2 cm\S\3). A branch of the perianal fistula also tracks superiorly behind the rectum and attaches to the rectum at the 5 o'clock position (image 15 of series 6). Suspected seton posterior to the anal canal (image 31 series 2). PET 124. There is edema signal inferiorly in both piriformis muscles, throughout much of the medial left gluteus maximus, and to a lesser extent in the right medial gluteus maximus, compatible with chronic myositis. No overt inflammatory findings involving the sciatic nerves. Today's exam was not protocoled as a musculoskeletal protocol exam but rather utilized the anorectal fistula protocol. JOINTS: Unremarkable. No dislocation or significant effusion. LIMITED INTRAPELVIC CONTENTS: Mildly prominent perirectal lymph nodes measuring up to 0.7 cm in long axis on image  7 series 6, similar overall to prior. 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 No acute fracture. IMPRESSION: 1. Posterior perianal fistula at the 6 o'clock position extends cephalad with a branch communicating with the rectum at the 5 o'clock position, and a branch extending into a presacral abscess, which is currently reduced in size to 6 cm\S\3 (previously 17 cc). Improved but persistent extension through the sacrococcygeal foramina and additional abscess extension below the left piriformis and deep to the left gluteus maximus measuring 7.2 cm\S\3, with mild perirectal stranding and suspected seton posterior to the anal canal. 2. 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 3. 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 Electronically signed by: Ryan Salvage MD 09/18/2024 04:19 PM EST RP Workstation: [id number]   CT ABDOMEN PELVIS W CONTRAST Result Date: 09/18/2024 CLINICAL DATA:  Crohn's exacerbation. Concern for anal fistula. Symptoms for 24 hours. EXAM: CT  ABDOMEN AND PELVIS WITH CONTRAST TECHNIQUE: Multidetector CT imaging of the abdomen and pelvis was performed using the standard protocol following bolus administration of intravenous contrast. RADIATION DOSE REDUCTION: This exam was performed according to the departmental dose-optimization program which includes automated exposure control, adjustment of the mA and/or kV according to patient size and/or use of iterative reconstruction technique. CONTRAST:  OMNIPAQUE  IOHEXOL  300 MG/ML  SOLN COMPARISON:  03/19/2024 CT and MRI. FINDINGS: Lower chest: Volume loss and subsegmental atelectasis in the dependent right lung. Normal heart size without pericardial or pleural effusion. Mild to moderate right hemidiaphragm elevation. Fluid or debris within the distal esophagus including on image 9/12. Hepatobiliary: Normal liver. Normal  gallbladder, without biliary ductal dilatation. Pancreas: Normal, without mass or ductal dilatation. Spleen: Normal in size, without focal abnormality. Adrenals/Urinary Tract: Normal adrenal glands. Bilateral hypoattenuating renal lesions. Larger lesions, including up to 4.1 cm, most consistent with cysts and minimally complex cysts. Other lesions are too small to characterize but most likely cysts. No follow-up indicated. No hydronephrosis.  Normal urinary bladder. Stomach/Bowel: Normal stomach, without wall thickening. Again identified is a midline seton. Mild asymmetric left rectal wall thickening including on image 83/3. Mildly improved since comparison CT. Trace presacral gas, decreased on image 75/3. The extent of ill-defined fluid and edema within the midline and left ischioanal fossa is decreased. Persistent relatively similar small volume gas including on image 81/3. Normal terminal ileum and appendix. Normal small bowel. Vascular/Lymphatic: Normal aortic caliber. Perirectal adenopathy including at 1.0 cm on 74/3. Increased from 7 mm on the prior. Reproductive: Normal prostate. Other: No abdominopelvic fluid or extraluminal gas. Small fat containing right inguinal hernia. Musculoskeletal: No acute osseous abnormality. IMPRESSION: Persistent, improved asymmetric left rectal wall thickening, favoring proctitis. Improved presacral gas and ill-defined fluid/gas within the eccentric left ischioanal fossa. Findings likely are secondary to a perianal fistula, which is not entirely evaluated on CT. If further imaging characterization is desired, the test of choice is high-resolution pre and post-contrast pelvic MRI. No evidence of terminal ileal or more proximal colonic Crohn disease. Esophageal air fluid level suggests dysmotility or gastroesophageal reflux. Perirectal adenopathy, increased and favored to be reactive. Electronically Signed   By: Rockey Kilts M.D.   On: 09/18/2024 13:37   Scheduled Meds:   LORazepam   0.5 mg Intravenous Once   senna-docusate  1 tablet Oral BID   Continuous Infusions:  piperacillin -tazobactam (ZOSYN )  IV 3.375 g (09/19/24 1012)     LOS: 1 day    Time spent: 50 mins    Darcel Dawley, MD [facility]   If 7PM-7AM, please contact night-coverage  "

## 2024-09-19 NOTE — Progress Notes (Addendum)
 "    Shawn [name], Shawn [name])-100.8 F (38.2 [name])] 100.5 F (38.1 [name]) (01/28 0528) Pulse Rate:  [73-90] 73 (01/28 0528) Resp:  [17-18] 18 (01/28 0528) BP: (120-142)/(71-82) 127/71 (01/28 0528) SpO2:  [95 %-98 %] 96 % (01/28 0528) Weight:  [88.8 kg] 88.8 kg (01/28 1222) Last BM Date : 09/16/24 General: Alert 39 year old male fatigued appearing in no acute distress. Heart: Regular rate and rhythm, no murmurs. Pulm: Breath sounds clear throughout. Abdomen: Soft, nondistended.  Nontender.  Positive bowel sounds to all 4 quadrants. Extremities: No lower extremity edema. Neurologic:  Alert and oriented x 4. Grossly normal neurologically. Psych:  Alert and cooperative.  Calm without agitation.  Intake/Output from previous day: 01/27 0701 - 01/28 0700 In: 1141.6 [IV Piggyback:1141.6] Out: -  Intake/Output this shift: Total I/O In: 240 [P.O.:240] Out: -   Lab Results: Recent Labs    09/18/24 0955 09/19/24 0625  WBC 15.2* 10.6*  HGB 11.9* 11.7*  HCT 35.8* 35.3*  PLT 301 288   BMET Recent Labs    09/18/24 0955 09/19/24 0625  NA 136 139  K 3.7 4.2  CL 103 105  CO2 23 23  GLUCOSE 102* 87  BUN 19 14  CREATININE 1.53* 1.78*  CALCIUM  8.7* 8.5*   LFT Recent Labs    09/19/24 0625  PROT 7.0  ALBUMIN  3.2*  AST 26  ALT 24  ALKPHOS 116  BILITOT 0.6   PT/INR No results for input(s): LABPROT, INR in the last 72 hours. Hepatitis Panel No results for input(s): HEPBSAG, HCVAB, HEPAIGM, HEPBIGM in the last 72 hours.  MR PELVIS W WO CONTRAST Result Date: 09/18/2024 EXAM: MRI OF THE PELVIS WITH AND WITHOUT INTRAVENOUS CONTRAST  09/18/2024 03:24:34 PM TECHNIQUE: Multiplanar magnetic resonance images of the pelvis with and without intravenous contrast. 9 mL (gadobutrol  (GADAVIST ) 1 MMOL/ML injection 9 mL GADOBUTROL  1 MMOL/ML IV SOLN). COMPARISON: 03/11/2024. CLINICAL HISTORY: Crohn disease with Shawn region fistula. FINDINGS: SOFT TISSUES: Mild perirectal stranding. Posterior Shawn fistula at the 6 o'clock position originating from the lower anal canal (image 26 of series 10) and extending cephalad at the 6 o'clock position into the presacral space. Reduced size of the presacral abscess, currently measuring 5.6 x 2.6 x 0.8 cm (volume = 6 cm\S\3), previously measuring 6.7 x 1.1 x 4.5 cm (volume = 17 cm\S\3). Previously associated extension through the sacrococcygeal foramina is improved although not completely resolved, with inflammatory stranding and enhancement extending along the lower sacral and sacrococcygeal foramina. There is also a small amount of abscess extension below the left piriformis muscle and deep to the left gluteus maximus muscle with this particular loculation measuring 2.6 x 1.7 x 3.1 cm (volume = 7.2 cm\S\3). A branch of the Shawn fistula also tracks superiorly behind the rectum and attaches to the rectum at the 5 o'clock position (image 15 of series 6). Suspected seton posterior to the anal canal (image 31 series 2). PET 124. There is edema signal inferiorly in both piriformis muscles, throughout much of the medial left gluteus maximus, and to a lesser extent in the right medial gluteus maximus, compatible  with chronic myositis. No overt inflammatory findings involving the sciatic nerves. Today's exam was not protocoled as a musculoskeletal protocol exam but rather utilized the anorectal fistula protocol. JOINTS: Unremarkable. No dislocation or significant effusion. LIMITED INTRAPELVIC CONTENTS: Mildly prominent perirectal lymph nodes measuring up to 0.7 cm in long axis on image 7 series 6, similar overall to  prior. 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 No acute fracture. IMPRESSION: 1. Posterior Shawn fistula at the 6 o'clock position extends cephalad with a branch communicating with the rectum at the 5 o'clock position, and a branch extending into a presacral abscess, which is currently reduced in size to 6 cm\S\3 (previously 17 cc). Improved but persistent extension through the sacrococcygeal foramina and additional abscess extension below the left piriformis and deep to the left gluteus maximus measuring 7.2 cm\S\3, with mild perirectal stranding and suspected seton posterior to the anal canal. 2. 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 3. 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 Electronically signed by: Ryan Salvage MD 09/18/2024 04:19 PM EST RP Workstation: [id number]   CT ABDOMEN PELVIS W CONTRAST Result Date: 09/18/2024 CLINICAL DATA:  Crohn's exacerbation. Concern for anal fistula. Symptoms for 24 hours. EXAM: CT ABDOMEN AND PELVIS WITH CONTRAST TECHNIQUE: Multidetector CT imaging of the abdomen and pelvis was performed using the standard protocol following bolus administration of intravenous contrast. RADIATION DOSE REDUCTION: This exam was performed according to the departmental dose-optimization program which includes automated exposure control, adjustment of the mA and/or kV according to patient size and/or use of iterative reconstruction technique. CONTRAST:  OMNIPAQUE  IOHEXOL  300 MG/ML  SOLN COMPARISON:  03/19/2024 CT and MRI. FINDINGS: Lower chest: Volume loss and subsegmental atelectasis in the dependent right lung. Normal heart size without pericardial or pleural effusion. Mild to moderate right hemidiaphragm elevation. Fluid or debris within the distal esophagus including on image 9/12. Hepatobiliary: Normal liver. Normal gallbladder, without biliary ductal  dilatation. Pancreas: Normal, without mass or ductal dilatation. Spleen: Normal in size, without focal abnormality. Adrenals/Urinary Tract: Normal adrenal glands. Bilateral hypoattenuating renal lesions. Larger lesions, including up to 4.1 cm, most consistent with cysts and minimally complex cysts. Other lesions are too small to characterize but most likely cysts. No follow-up indicated. No hydronephrosis.  Normal urinary bladder. Stomach/Bowel: Normal stomach, without wall thickening. Again identified is a midline seton. Mild asymmetric left rectal wall thickening including on image 83/3. Mildly improved since comparison CT. Trace presacral gas, decreased on image 75/3. The extent of ill-defined fluid and edema within the midline and left ischioanal fossa is decreased. Persistent relatively similar small volume gas including on image 81/3. Normal terminal ileum and appendix. Normal small bowel. Vascular/Lymphatic: Normal aortic caliber. Perirectal adenopathy including at 1.0 cm on 74/3. Increased from 7 mm on the prior. Reproductive: Normal prostate. Other: No abdominopelvic fluid or extraluminal gas. Small fat containing right inguinal hernia. Musculoskeletal: No acute osseous abnormality. IMPRESSION: Persistent, improved asymmetric left rectal wall thickening, favoring proctitis. Improved presacral gas and ill-defined fluid/gas within the eccentric left ischioanal fossa. Findings likely are secondary to a Shawn fistula, which is not entirely evaluated on CT. If further imaging characterization is desired, the test of choice is high-resolution pre and post-contrast pelvic MRI. No evidence of terminal ileal or more proximal colonic Crohn disease. Esophageal air fluid level suggests dysmotility or gastroesophageal reflux. Perirectal adenopathy, increased and favored to be reactive. Electronically Signed   By: Rockey Marthann HERO.D.  On: 09/18/2024 13:37    Assessment / Plan:  39 year old male with severe  Shawn fistulizing Crohn's disease diagnosed in 2021 complicated by recurrent Shawn abscesses s/p EUA with seton placement, coccygeal osteomyelitis s/p modified Hanley procedure and coccygectomy. Admitted 09/18/2024 with rectal [name] and purulent rectal drainage. WBC 15.2 -> 10.6.  CRP 15.1. CTAP with contrast showed evidence of a Shawn fistula and left rectal wall thickening suggestive of proctitis. 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 Colorectal surgery consult requested. Temp 100.11F -> 100.13F.  Blood cultures no growth  < 24 hrs. Hemodynamically stable. - Continue IV Zosyn  per ID - Await colorectal surgery recommendations, check cultures if abscess drained - Clear liquid diet - IV fluids and [name] management per the hospitalist - Patient instructed to avoid ibuprofen /NSAIDs - Await further recommendations per Dr. Federico   Mild anemia, suspect secondary to chronic disease plus B12 deficiency. Hg 11.9 -> 11.7.  No overt GI bleeding. 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 I performed a substantive portion of this encounter (greater than 50%), including complete performance of at least one of the key components, in conjunction with the APP. I agree with the APP's note, impression and recommendations with additional input as follows.  Febrile overnight. Patient's rectal [name] has improved with antibiotics and [name] medications.  Surgery is considering EUA/seton placement regarding his residual abscesses.  WBC count appears to be improving. CRP remains profoundly elevated, though this may be related to his infection.   Estefana Federico, MD "

## 2024-09-19 NOTE — Consult Note (Signed)
 "       Consult Note  Shawn [name] Mar 20, 1986  [id number].    Requesting MD:  Dr. Darcel Dawley, MD  Chief Complaint/Reason for Consult:  Rectal pain with history and concern of perianal fistula and presacral abscess  HPI:  Shawn [name] is a 39 year old male with a PMH of perianal fistulizing Crohn's disease complicated by recurrent perianal perianal abscesses that presented to the ED due to rectal pain with purulent drainage. Other medical history includes GERD, depression, CKD, arthritis, anxiety, anemia, hx of osteomyelitis of coccyx, and HTN.   Patient states that he began to experience worsening rectal pain on 09/17/2024. Accordingly, he proceeded to the ED. Patient states that he has had associated pain of the gluteal regions bilaterally but the left gluteal region is more painful. He reports some discharge that he noticed 09/17/2024 from the rectal region that varies in color. The discharge was yellow/green most recently. He feels that he had fevers at home. Most recent vitals 100.8. He also reports chills. Denies diaphoresis. Denies abdominal pain. His last BM was 1/25 about 3 days ago. Patient reports flatulence. He does use stool softners to help most days. His last PO intake was yesterday evening. Currently he is on a liquid diet and had water about an hour ago.  Patient's work up included labs with leukocytosis and decreased HGB 11.9 from 13.5. Other abnormalities include HCT 35.8, RDW 16.5, Neutro abs 10.6, glucose 102, creatinine 1.53, calcium  8.7, alk phos 134, and GFR 59. CRP 10.3, Sed rate 58. Blood cultures in process. CT imaging that showed persistent, improved asymmetric left rectal wall thickening, favoring proctitis. Improved presacral gas and ill-defined fluid/gas within the eccentric left ischioanal fossa. No evidence of terminal ileal or more proximal colonic Crohn disease.   Pelvic MRI completed and showed posterior perianal fistula at the 6 o'clock position  extends cephalad with a branch communicating with the rectum at the 5 o'clock position, and a branch extending into a presacral abscess, which is currently reduced in size to 6 cm\S\3 (previously 17 cc). Improved but persistent extension through the sacrococcygeal foramina and additional abscess extension below the left piriformis and deep to the left gluteus maximus measuring 7.2 cm\S\3, with mild perirectal stranding and suspected seton posterior to the anal canal. 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  Patient reports that he has been on Stelara  consistently at least since 11/2023. He does report that this month's dose has been delayed but did receive the injection at his house.  Pt has now been admitted by medicine service and evaluated by GI and ID. Patient on Zosyn . General surgery has been asked to consult.    Surgical history: s/p EUA with internal drainage of left posterior perirectal abscess 03/2021, s/p modified hanley procedure drainage of high anal fistula with seton placement, coccygectomy 06/2021, and s/p transrectal drainage replacement of seton excision of perianeal sinus tract exam under anesthesia 12/2021. 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   PO B12  initiated 12/31/20   Anxiety    Arthritis    Chronic kidney disease    Crohn's colitis (HCC)    Depression    Dilated cardiomyopathy (HCC) 05/18/2013   By catheterization 2014    Fistula    GERD (gastroesophageal reflux disease)    Hypertension    Neuromuscular disorder (HCC)    Osteomyelitis (HCC)    tailbone   Pneumonia     Past Surgical History:  Procedure Laterality Date   BIOPSY  01/01/2021   Procedure: BIOPSY;  Surgeon: Aneita Gwendlyn DASEN, MD;  Location[facility] ENDOSCOPY;  Service: Endoscopy;;   COCCYGECTOMY  07/07/2021   COLONOSCOPY WITH PROPOFOL  N/A 01/01/2021   Procedure: COLONOSCOPY WITH PROPOFOL ;  Surgeon: Aneita Gwendlyn DASEN, MD;  Location: [facility] ENDOSCOPY;  Service: Endoscopy;  Laterality: N/A;   INCISION AND DRAINAGE ABSCESS N/A 07/07/2021   Procedure: MODIFIED HANLEY PROCEDURE, DRAINAGE WITH SETON PLACEMENT;  Surgeon: [name], MD;  Location: [facility] ORS;  Service: General;  Laterality: N/A;   INCISION AND DRAINAGE ABSCESS  05/04/2017   right groin   INCISION AND DRAINAGE PERIRECTAL ABSCESS  04/21/2021   INCISION AND DRAINAGE PERIRECTAL ABSCESS  05/18/2013   LEFT AND RIGHT HEART CATHETERIZATION WITH CORONARY ANGIOGRAM N/A 11/21/2012   Procedure: LEFT AND RIGHT HEART CATHETERIZATION WITH CORONARY ANGIOGRAM;  Surgeon: Salena GORMAN Negri, MD;  Location: [facility] CATH LAB;  Service: Cardiovascular;  Laterality: N/A;   MANDIBLE FRACTURE SURGERY  08/23/2004   PLACEMENT OF SETON N/A 07/07/2021   Procedure: PLACEMENT OF SETON;  Surgeon: [name], MD;  Location: [facility] ORS;  Service: General;  Laterality: N/A;   PLACEMENT OF SETON N/A 01/15/2022   Procedure: PLACEMENT OF SETON;  Surgeon: [name], MD;  Location: [facility] ORS;  Service: General;  Laterality: N/A;   RECTAL EXAM UNDER ANESTHESIA N/A 01/15/2022   Procedure: RECTAL EXAM UNDER ANESTHESIA;  Surgeon: [name], MD;  Location: [facility] ORS;  Service: General;  Laterality: N/A;  TRANSRECTAL DRAINAGE REPLACEMENT OF SETON EXCISION OF  PERINEAL SINUS TRACT ANORECTAL EXAM UNDER ANESTHESIA    Social History:  reports that he has quit smoking. His smoking use included cigarettes. He has never used smokeless tobacco. He reports that he does not currently use alcohol. He reports that he does not use drugs.  Allergies: Allergies[1]  Medications Prior to Admission  Medication Sig Dispense Refill   acetaminophen  (TYLENOL ) 500 MG tablet Take 2 tablets (1,000 mg total) by mouth every 8 (eight) hours as needed for mild pain (pain score 1-3) or fever.     senna-docusate (SENOKOT-S) 8.6-50 MG tablet Take 2 tablets by mouth 2 (two) times daily. 120 tablet 0   ustekinumab  (STELARA ) 90 MG/ML SOSY injection Inject 1 mL (90 mg total) into the skin every 28 (twenty-eight) days. 3 mL 3   bisacodyl  (DULCOLAX) 5 MG EC tablet Take 2 tablets (10 mg total) by mouth daily as needed for moderate constipation or mild constipation. (Patient not taking: Reported on 09/18/2024) 30 tablet 0   polyethylene glycol (MIRALAX  / GLYCOLAX ) 17 g packet Take 17 g by mouth 3 (three) times daily. (Patient not taking: Reported on 09/18/2024) 60 each 1   Vitamin D , Ergocalciferol , (DRISDOL ) 1.25 MG (50000 UNIT)  CAPS capsule Take 1 capsule (50,000 Units total) by mouth every 7 (seven) days. (Patient not taking: No sig reported) 8 capsule 0    Blood pressure 127/71, pulse 73, temperature (!) 100.5 F (38.1 C), resp. rate 18, SpO2 96%. Physical Exam:  General: Pleasant male who is laying in bed in NAD. HEENT: Head is normocephalic, atraumatic. Sclera are noninjected. EOMI. Ears and nose without any masses or lesions. Mouth is pink and moist. Heart: HR normal during encounter.  Lungs: Respiratory effort nonlabored on room air.  Abd: Soft, NT. No rebound tenderness or guarding.  Psych: A&Ox3 with an appropriate affect. Rectal exam: Nursing staff chaperone was present. Patient with pain around the rectal area. Perianal sensory intact. No external hemorrhoids or fissures  noted. Fistula noted posterior to anal area. Phoebe present that extends inside anus. There is white purulent discharge from the rectum. Digital Rectal Exam limited. No masses palpated. No active bleeding noted.    Results for orders placed or performed during the hospital encounter of 09/18/24 (from the past 48 hours)  Comprehensive metabolic panel     Status: Abnormal   Collection Time: 09/18/24  9:55 AM  Result Value Ref Range   Sodium 136 135 - 145 mmol/L   Potassium 3.7 3.5 - 5.1 mmol/L   Chloride 103 98 - 111 mmol/L   CO2 23 22 - 32 mmol/L   Glucose, Bld 102 (H) 70 - 99 mg/dL    Comment: Glucose reference range applies only to samples taken after fasting for at least 8 hours.   BUN 19 6 - 20 mg/dL   Creatinine, Ser 8.46 (H) 0.61 - 1.24 mg/dL   Calcium  8.7 (L) 8.9 - 10.3 mg/dL   Total Protein 7.8 6.5 - 8.1 g/dL   Albumin  3.5 3.5 - 5.0 g/dL   AST 18 15 - 41 U/L   ALT 13 0 - 44 U/L   Alkaline Phosphatase 134 (H) 38 - 126 U/L   Total Bilirubin 0.4 0.0 - 1.2 mg/dL   GFR, Estimated 59 (L) >60 mL/min    Comment: (NOTE) Calculated using the CKD-EPI Creatinine Equation (2021)    Anion gap 11 5 - 15    Comment: Performed at [facility], [address] [place], [place] [address]  CBC with Differential     Status: Abnormal   Collection Time: 09/18/24  9:55 AM  Result Value Ref Range   WBC 15.2 (H) 4.0 - 10.5 K/uL   RBC 4.45 4.22 - 5.81 MIL/uL   Hemoglobin 11.9 (L) 13.0 - 17.0 g/dL   HCT 64.1 (L) 60.9 - 47.9 %   MCV 80.4 80.0 - 100.0 fL   MCH 26.7 26.0 - 34.0 pg   MCHC 33.2 30.0 - 36.0 g/dL   RDW 83.4 (H) 88.4 - 84.4 %   Platelets 301 150 - 400 K/uL   nRBC 0.0 0.0 - 0.2 %   Neutrophils Relative % 71 %   Neutro Abs 10.6 (H) 1.7 - 7.7 K/uL   Lymphocytes Relative 21 %   Lymphs Abs 3.2 0.7 - 4.0 K/uL   Monocytes Relative 6 %   Monocytes Absolute 0.9 0.1 - 1.0 K/uL   Eosinophils Relative 2 %   Eosinophils Absolute 0.4 0.0 - 0.5 K/uL   Basophils Relative 0 %    Basophils Absolute 0.1 0.0 - 0.1 K/uL   Immature Granulocytes 0 %   Abs Immature Granulocytes 0.05 0.00 - 0.07 K/uL    Comment: Performed at [facility], [address] [place], [place] [address]  C-reactive protein     Status: Abnormal   Collection Time: 09/18/24  9:55 AM  Result Value Ref Range   CRP 10.3 (H) <1.0 mg/dL    Comment: Performed at [facility], [address] [place], [place] [address]  Sedimentation rate     Status: Abnormal   Collection Time: 09/18/24  9:55 AM  Result Value Ref Range   Sed Rate 58 (H) 0 - 16 mm/hr    Comment: Performed at [facility], [address], [place], [place] [address]  Culture, blood (routine x 2)     Status: None (Preliminary result)   Collection Time: 09/18/24 10:10 AM   Specimen: BLOOD  Result Value Ref Range   Specimen Description      BLOOD LEFT ANTECUBITAL Performed at [facility], [address] [place], [place] [address]    Special Requests      BOTTLES DRAWN AEROBIC AND ANAEROBIC Blood Culture adequate volume Performed at [facility], [address] [place], [place] [address]    Culture      NO GROWTH < 24 HOURS Performed at [facility], [address] [place], [place] [address]    Report Status PENDING   Culture, blood (routine x 2)     Status: None (Preliminary result)   Collection Time: 09/18/24 10:20 AM   Specimen: BLOOD  Result Value Ref Range   Specimen Description      BLOOD RIGHT ANTECUBITAL Performed at [facility], [address], [place], [place] [address]    Special Requests      BOTTLES DRAWN AEROBIC AND ANAEROBIC Blood Culture adequate volume Performed at [facility], [address] [place], [place] [address]    Culture      NO GROWTH < 24 HOURS Performed at [facility], [address] [place], [place] [address]    Report Status PENDING   Comprehensive  metabolic panel     Status: Abnormal   Collection Time: 09/19/24  6:25 AM  Result Value Ref Range   Sodium 139 135 - 145 mmol/L   Potassium 4.2 3.5 - 5.1 mmol/L   Chloride 105 98 - 111 mmol/L   CO2 23 22 - 32 mmol/L   Glucose, Bld 87 70 - 99 mg/dL    Comment: Glucose reference range applies only to samples taken after fasting for at least 8 hours.   BUN 14 6 - 20 mg/dL   Creatinine, Ser 8.21 (H) 0.61 - 1.24 mg/dL   Calcium  8.5 (L) 8.9 - 10.3 mg/dL   Total Protein 7.0 6.5 - 8.1 g/dL   Albumin  3.2 (L) 3.5 - 5.0 g/dL   AST 26 15 - 41 U/L   ALT 24 0 - 44 U/L   Alkaline Phosphatase 116 38 - 126 U/L   Total Bilirubin 0.6 0.0 - 1.2 mg/dL   GFR, Estimated 49 (L) >60 mL/min    Comment: (NOTE) Calculated using the CKD-EPI Creatinine Equation (2021)    Anion gap 11 5 - 15    Comment: Performed at [facility], [address], [place], [place] [address]  CBC     Status: Abnormal   Collection Time: 09/19/24  6:25 AM  Result Value Ref Range   WBC 10.6 (H) 4.0 - 10.5 K/uL   RBC 4.39 4.22 - 5.81 MIL/uL   Hemoglobin 11.7 (L) 13.0 - 17.0  g/dL   HCT 64.6 (L) 60.9 - 47.9 %   MCV 80.4 80.0 - 100.0 fL   MCH 26.7 26.0 - 34.0 pg   MCHC 33.1 30.0 - 36.0 g/dL   RDW 83.3 (H) 88.4 - 84.4 %   Platelets 288 150 - 400 K/uL   nRBC 0.0 0.0 - 0.2 %    Comment: Performed at [facility], [address] [place], [place] [address]  C-reactive protein     Status: Abnormal   Collection Time: 09/19/24  6:25 AM  Result Value Ref Range   CRP 15.1 (H) <1.0 mg/dL    Comment: Performed at [facility], [address] [place], [place] [address]   MR PELVIS W WO CONTRAST Result Date: 09/18/2024 EXAM: MRI OF THE PELVIS WITH AND WITHOUT INTRAVENOUS CONTRAST 09/18/2024 03:24:34 PM TECHNIQUE: Multiplanar magnetic resonance images of the pelvis with and without intravenous contrast. 9 mL (gadobutrol  (GADAVIST ) 1 MMOL/ML injection 9 mL GADOBUTROL  1 MMOL/ML IV SOLN). COMPARISON:  03/11/2024. CLINICAL HISTORY: Crohn disease with perianal region fistula. FINDINGS: SOFT TISSUES: Mild perirectal stranding. Posterior perianal fistula at the 6 o'clock position originating from the lower anal canal (image 26 of series 10) and extending cephalad at the 6 o'clock position into the presacral space. Reduced size of the presacral abscess, currently measuring 5.6 x 2.6 x 0.8 cm (volume = 6 cm\S\3), previously measuring 6.7 x 1.1 x 4.5 cm (volume = 17 cm\S\3). Previously associated extension through the sacrococcygeal foramina is improved although not completely resolved, with inflammatory stranding and enhancement extending along the lower sacral and sacrococcygeal foramina. There is also a small amount of abscess extension below the left piriformis muscle and deep to the left gluteus maximus muscle with this particular loculation measuring 2.6 x 1.7 x 3.1 cm (volume = 7.2 cm\S\3). A branch of the perianal fistula also tracks superiorly behind the rectum and attaches to the rectum at the 5 o'clock position (image 15 of series 6). Suspected seton posterior to the anal canal (image 31 series 2). PET 124. There is edema signal inferiorly in both piriformis muscles, throughout much of the medial left gluteus maximus, and to a lesser extent in the right medial gluteus maximus, compatible with chronic myositis. No overt inflammatory findings involving the sciatic nerves. Today's exam was not protocoled as a musculoskeletal protocol exam but rather utilized the anorectal fistula protocol. JOINTS: Unremarkable. No dislocation or significant effusion. LIMITED INTRAPELVIC CONTENTS: Mildly prominent perirectal lymph nodes measuring up to 0.7 cm in long axis on image 7 series 6, similar overall to prior. 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 No acute fracture. IMPRESSION: 1. Posterior perianal fistula at the 6 o'clock position extends cephalad with a branch communicating with the rectum  at the 5 o'clock position, and a branch extending into a presacral abscess, which is currently reduced in size to 6 cm\S\3 (previously 17 cc). Improved but persistent extension through the sacrococcygeal foramina and additional abscess extension below the left piriformis and deep to the left gluteus maximus measuring 7.2 cm\S\3, with mild perirectal stranding and suspected seton posterior to the anal canal. 2. 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 3. 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 Electronically signed by: Ryan Salvage MD 09/18/2024 04:19 PM EST RP Workstation: [id number]   CT ABDOMEN PELVIS W CONTRAST Result Date: 09/18/2024 CLINICAL DATA:  Crohn's exacerbation. Concern for anal fistula. Symptoms for 24 hours. EXAM:  CT ABDOMEN AND PELVIS WITH CONTRAST TECHNIQUE: Multidetector CT imaging of the abdomen and pelvis was performed using the standard protocol following bolus administration of intravenous contrast. RADIATION DOSE REDUCTION: This exam was performed according to the departmental dose-optimization program which includes automated exposure control, adjustment of the mA and/or kV according to patient size and/or use of iterative reconstruction technique. CONTRAST:  OMNIPAQUE  IOHEXOL  300 MG/ML  SOLN COMPARISON:  03/19/2024 CT and MRI. FINDINGS: Lower chest: Volume loss and subsegmental atelectasis in the dependent right lung. Normal heart size without pericardial or pleural effusion. Mild to moderate right hemidiaphragm elevation. Fluid or debris within the distal esophagus including on image 9/12. Hepatobiliary: Normal liver. Normal gallbladder, without biliary ductal dilatation. Pancreas: Normal, without mass or ductal dilatation. Spleen: Normal in size, without focal abnormality. Adrenals/Urinary Tract: Normal adrenal glands. Bilateral hypoattenuating renal lesions. Larger lesions,  including up to 4.1 cm, most consistent with cysts and minimally complex cysts. Other lesions are too small to characterize but most likely cysts. No follow-up indicated. No hydronephrosis.  Normal urinary bladder. Stomach/Bowel: Normal stomach, without wall thickening. Again identified is a midline seton. Mild asymmetric left rectal wall thickening including on image 83/3. Mildly improved since comparison CT. Trace presacral gas, decreased on image 75/3. The extent of ill-defined fluid and edema within the midline and left ischioanal fossa is decreased. Persistent relatively similar small volume gas including on image 81/3. Normal terminal ileum and appendix. Normal small bowel. Vascular/Lymphatic: Normal aortic caliber. Perirectal adenopathy including at 1.0 cm on 74/3. Increased from 7 mm on the prior. Reproductive: Normal prostate. Other: No abdominopelvic fluid or extraluminal gas. Small fat containing right inguinal hernia. Musculoskeletal: No acute osseous abnormality. IMPRESSION: Persistent, improved asymmetric left rectal wall thickening, favoring proctitis. Improved presacral gas and ill-defined fluid/gas within the eccentric left ischioanal fossa. Findings likely are secondary to a perianal fistula, which is not entirely evaluated on CT. If further imaging characterization is desired, the test of choice is high-resolution pre and post-contrast pelvic MRI. No evidence of terminal ileal or more proximal colonic Crohn disease. Esophageal air fluid level suggests dysmotility or gastroesophageal reflux. Perirectal adenopathy, increased and favored to be reactive. Electronically Signed   By: Rockey Kilts M.D.   On: 09/18/2024 13:37      Assessment/Plan -39 year old male with history of perianal fistulizing Crohn's disease with recurrent perianal abscesses that presented for rectal pain and drainage. Imaging concerning for proctitis and fluid/abscess. Hx of osteomyelitis.  -s/p EUA with internal drainage  of left posterior perirectal abscess 03/2021, s/p modified hanley procedure drainage of high anal fistula with seton placement, coccygectomy 06/2021, and s/p transrectal drainage replacement of seton excision of perianeal sinus tract exam under anesthesia 12/2021.  -Tmax 100.8.  -WBC improved 10.6 from 15.2 -Patient with tenderness of left gluteal region, anus/rectal region. -Discussed with patient his work up including imaging, labs, history, and current symptoms. Discussed that he may benefit from EUA/seton placement regarding fluid collection/abscess. We also discussed definitive treatment with diversion and patient remains uninterested in this. Will discuss further with attending.  -Discussed that he has been consistent with his Stelera for Crohn's management. May benefit from further work up to evaluate for active [employer] varco. If active flair, may be reasonable to consider medication alternative.  -Agree with abx per ID. -NPO for now -Will follow along with you.   FEN: NPO; IVF per primary team VTE: SCDs ID: [email or web address]   I reviewed specialist notes, consulting provider notes, nursing notes, hospitalist notes, last  24 h vitals and pain scores, last 48 h intake and output, last 24 h labs and trends, and last 24 h imaging results.  This care required high  level of medical decision making.   Marjorie Carlyon Favre, [facility] 09/19/2024, 10:16 AM Please see Amion for pager number during day hours 7:00am-4:30pm       [1]  Allergies Allergen Reactions   Shrimp [Shellfish Allergy] Shortness Of Breath   Nsaids Other (See Comments)    CROHN'S DISEASE = NO NSAIDS   Bactoshield Chg [Chlorhexidine  Gluconate] Itching   "

## 2024-09-20 DIAGNOSIS — D649 Anemia, unspecified: Secondary | ICD-10-CM | POA: Diagnosis not present

## 2024-09-20 DIAGNOSIS — K651 Peritoneal abscess: Secondary | ICD-10-CM | POA: Diagnosis not present

## 2024-09-20 DIAGNOSIS — K50113 Crohn's disease of large intestine with fistula: Secondary | ICD-10-CM | POA: Diagnosis not present

## 2024-09-20 DIAGNOSIS — N182 Chronic kidney disease, stage 2 (mild): Secondary | ICD-10-CM | POA: Diagnosis not present

## 2024-09-20 DIAGNOSIS — M86659 Other chronic osteomyelitis, unspecified thigh: Secondary | ICD-10-CM | POA: Diagnosis not present

## 2024-09-20 DIAGNOSIS — K603 Anal fistula, unspecified: Secondary | ICD-10-CM | POA: Diagnosis not present

## 2024-09-20 LAB — CBC
HCT: 34.4 % — ABNORMAL LOW (ref 39.0–52.0)
Hemoglobin: 11.2 g/dL — ABNORMAL LOW (ref 13.0–17.0)
MCH: 26.7 pg (ref 26.0–34.0)
MCHC: 32.6 g/dL (ref 30.0–36.0)
MCV: 82.1 fL (ref 80.0–100.0)
Platelets: 284 10*3/uL (ref 150–400)
RBC: 4.19 MIL/uL — ABNORMAL LOW (ref 4.22–5.81)
RDW: 16.5 % — ABNORMAL HIGH (ref 11.5–15.5)
WBC: 7.6 10*3/uL (ref 4.0–10.5)
nRBC: 0 % (ref 0.0–0.2)

## 2024-09-20 LAB — MISC LABCORP TEST (SEND OUT): Labcorp test code: 83935

## 2024-09-20 LAB — BASIC METABOLIC PANEL WITH GFR
Anion gap: 9 (ref 5–15)
BUN: 14 mg/dL (ref 6–20)
CO2: 24 mmol/L (ref 22–32)
Calcium: 8.5 mg/dL — ABNORMAL LOW (ref 8.9–10.3)
Chloride: 107 mmol/L (ref 98–111)
Creatinine, Ser: 1.62 mg/dL — ABNORMAL HIGH (ref 0.61–1.24)
GFR, Estimated: 55 mL/min — ABNORMAL LOW
Glucose, Bld: 96 mg/dL (ref 70–99)
Potassium: 4.2 mmol/L (ref 3.5–5.1)
Sodium: 140 mmol/L (ref 135–145)

## 2024-09-20 LAB — PHOSPHORUS: Phosphorus: 3.1 mg/dL (ref 2.5–4.6)

## 2024-09-20 LAB — MAGNESIUM: Magnesium: 2.4 mg/dL (ref 1.7–2.4)

## 2024-09-20 MED ORDER — OXYCODONE HCL 5 MG PO TABS
10.0000 mg | ORAL_TABLET | Freq: Four times a day (QID) | ORAL | Status: DC | PRN
  Administered 2024-09-20 – 2024-09-21 (×3): 10 mg via ORAL
  Filled 2024-09-20 (×3): qty 2

## 2024-09-20 MED ORDER — METRONIDAZOLE 500 MG PO TABS
500.0000 mg | ORAL_TABLET | Freq: Two times a day (BID) | ORAL | Status: DC
  Administered 2024-09-20 – 2024-09-21 (×2): 500 mg via ORAL
  Filled 2024-09-20 (×2): qty 1

## 2024-09-20 MED ORDER — DIPHENHYDRAMINE HCL 25 MG PO CAPS
25.0000 mg | ORAL_CAPSULE | Freq: Once | ORAL | Status: AC
  Administered 2024-09-20: 25 mg via ORAL
  Filled 2024-09-20: qty 1

## 2024-09-20 MED ORDER — LEVOFLOXACIN 500 MG PO TABS
750.0000 mg | ORAL_TABLET | Freq: Every day | ORAL | Status: DC
  Administered 2024-09-20 – 2024-09-21 (×2): 750 mg via ORAL
  Filled 2024-09-20 (×2): qty 2

## 2024-09-20 NOTE — Progress Notes (Signed)
 "  Progress Note     Subjective: Patient reports that pain has improved slightly. Tolerating CLD. Denies n/v. Passing flatus. Has not had BM since admission.    ROS  All negative with the exception of above.  Objective: Vital signs in last 24 hours: Temp:  [98.6 F (37 C)-99 F (37.2 C)] 98.6 F (37 C) (01/29 0555) Pulse Rate:  [76-80] 76 (01/29 0555) Resp:  [16-18] 18 (01/29 0555) BP: (116-129)/(71-83) 116/77 (01/29 0555) SpO2:  [96 %-99 %] 97 % (01/29 0555) Weight:  [88.8 kg] 88.8 kg (01/28 1222) Last BM Date : 09/16/24  Intake/Output from previous day: 01/28 0701 - 01/29 0700 In: 860.1 [P.O.:720; IV Piggyback:140.1] Out: -  Intake/Output this shift: No intake/output data recorded.  PE: General: Pleasant male who is laying in bed in NAD. HEENT: Head is normocephalic, atraumatic. Sclera are noninjected. EOMI. Ears and nose without any masses or lesions. Mouth is pink and moist. Heart: HR normal during encounter.  Lungs: Respiratory effort nonlabored on room air.  Psych: A&Ox3 with an appropriate affect. Rectal exam: Nursing staff chaperone was present. Patient with some tenderness around the rectal area that has improved since yesterday. Perianal sensory intact. No external hemorrhoids or fissures noted. Fistula noted posterior to anal area. Phoebe present that extends inside anus. No discharge present this AM. Digital Rectal Exam limited. No masses palpated. No active bleeding noted.   Lab Results:  Recent Labs    09/19/24 0625 09/20/24 0552  WBC 10.6* 7.6  HGB 11.7* 11.2*  HCT 35.3* 34.4*  PLT 288 284   BMET Recent Labs    09/19/24 0625 09/20/24 0552  NA 139 140  K 4.2 4.2  CL 105 107  CO2 23 24  GLUCOSE 87 96  BUN 14 14  CREATININE 1.78* 1.62*  CALCIUM  8.5* 8.5*   PT/INR No results for input(s): LABPROT, INR in the last 72 hours. CMP     Component Value Date/Time   NA 140 09/20/2024 0552   NA 141 06/05/2024 1532   K 4.2 09/20/2024 0552   CL  107 09/20/2024 0552   CO2 24 09/20/2024 0552   GLUCOSE 96 09/20/2024 0552   BUN 14 09/20/2024 0552   BUN 15 06/05/2024 1532   CREATININE 1.62 (H) 09/20/2024 0552   CREATININE 1.40 (H) 08/20/2024 0845   CALCIUM  8.5 (L) 09/20/2024 0552   PROT 7.0 09/19/2024 0625   PROT 7.1 06/05/2024 1532   ALBUMIN  3.2 (L) 09/19/2024 0625   ALBUMIN  3.8 (L) 06/05/2024 1532   AST 26 09/19/2024 0625   AST 21 08/20/2024 0845   ALT 24 09/19/2024 0625   ALT 20 08/20/2024 0845   ALKPHOS 116 09/19/2024 0625   BILITOT 0.6 09/19/2024 0625   BILITOT 0.4 08/20/2024 0845   GFRNONAA 55 (L) 09/20/2024 0552   GFRNONAA >60 08/20/2024 0845   GFRAA >60 01/08/2020 1830   Lipase     Component Value Date/Time   LIPASE 62 (H) 10/25/2023 1206       Studies/Results: MR PELVIS W WO CONTRAST Result Date: 09/18/2024 EXAM: MRI OF THE PELVIS WITH AND WITHOUT INTRAVENOUS CONTRAST 09/18/2024 03:24:34 PM TECHNIQUE: Multiplanar magnetic resonance images of the pelvis with and without intravenous contrast. 9 mL (gadobutrol  (GADAVIST ) 1 MMOL/ML injection 9 mL GADOBUTROL  1 MMOL/ML IV SOLN). COMPARISON: 03/11/2024. CLINICAL HISTORY: Crohn disease with perianal region fistula. FINDINGS: SOFT TISSUES: Mild perirectal stranding. Posterior perianal fistula at the 6 o'clock position originating from the lower anal canal (image 26 of series 10) and  extending cephalad at the 6 o'clock position into the presacral space. Reduced size of the presacral abscess, currently measuring 5.6 x 2.6 x 0.8 cm (volume = 6 cm\S\3), previously measuring 6.7 x 1.1 x 4.5 cm (volume = 17 cm\S\3). Previously associated extension through the sacrococcygeal foramina is improved although not completely resolved, with inflammatory stranding and enhancement extending along the lower sacral and sacrococcygeal foramina. There is also a small amount of abscess extension below the left piriformis muscle and deep to the left gluteus maximus muscle with this particular  loculation measuring 2.6 x 1.7 x 3.1 cm (volume = 7.2 cm\S\3). A branch of the perianal fistula also tracks superiorly behind the rectum and attaches to the rectum at the 5 o'clock position (image 15 of series 6). Suspected seton posterior to the anal canal (image 31 series 2). PET 124. There is edema signal inferiorly in both piriformis muscles, throughout much of the medial left gluteus maximus, and to a lesser extent in the right medial gluteus maximus, compatible with chronic myositis. No overt inflammatory findings involving the sciatic nerves. Today's exam was not protocoled as a musculoskeletal protocol exam but rather utilized the anorectal fistula protocol. JOINTS: Unremarkable. No dislocation or significant effusion. LIMITED INTRAPELVIC CONTENTS: Mildly prominent perirectal lymph nodes measuring up to 0.7 cm in long axis on image 7 series 6, similar overall to prior. 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 No acute fracture. IMPRESSION: 1. Posterior perianal fistula at the 6 o'clock position extends cephalad with a branch communicating with the rectum at the 5 o'clock position, and a branch extending into a presacral abscess, which is currently reduced in size to 6 cm\S\3 (previously 17 cc). Improved but persistent extension through the sacrococcygeal foramina and additional abscess extension below the left piriformis and deep to the left gluteus maximus measuring 7.2 cm\S\3, with mild perirectal stranding and suspected seton posterior to the anal canal. 2. 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 3. 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 Electronically signed by: Ryan Salvage MD 09/18/2024 04:19 PM EST RP Workstation: [id number]   CT ABDOMEN PELVIS W CONTRAST Result Date: 09/18/2024 CLINICAL DATA:  Crohn's exacerbation. Concern for anal fistula. Symptoms for 24 hours.  EXAM: CT ABDOMEN AND PELVIS WITH CONTRAST TECHNIQUE: Multidetector CT imaging of the abdomen and pelvis was performed using the standard protocol following bolus administration of intravenous contrast. RADIATION DOSE REDUCTION: This exam was performed according to the departmental dose-optimization program which includes automated exposure control, adjustment of the mA and/or kV according to patient size and/or use of iterative reconstruction technique. CONTRAST:  OMNIPAQUE  IOHEXOL  300 MG/ML  SOLN COMPARISON:  03/19/2024 CT and MRI. FINDINGS: Lower chest: Volume loss and subsegmental atelectasis in the dependent right lung. Normal heart size without pericardial or pleural effusion. Mild to moderate right hemidiaphragm elevation. Fluid or debris within the distal esophagus including on image 9/12. Hepatobiliary: Normal liver. Normal gallbladder, without biliary ductal dilatation. Pancreas: Normal, without mass or ductal dilatation. Spleen: Normal in size, without focal abnormality. Adrenals/Urinary Tract: Normal adrenal glands. Bilateral hypoattenuating renal lesions. Larger lesions, including up to 4.1 cm, most consistent with cysts and minimally complex cysts. Other lesions are too small to characterize but most likely cysts. No follow-up indicated. No hydronephrosis.  Normal urinary bladder. Stomach/Bowel: Normal stomach, without wall thickening. Again identified is a midline seton. Mild asymmetric left rectal wall thickening including on image 83/3. Mildly improved since comparison CT.  Trace presacral gas, decreased on image 75/3. The extent of ill-defined fluid and edema within the midline and left ischioanal fossa is decreased. Persistent relatively similar small volume gas including on image 81/3. Normal terminal ileum and appendix. Normal small bowel. Vascular/Lymphatic: Normal aortic caliber. Perirectal adenopathy including at 1.0 cm on 74/3. Increased from 7 mm on the prior. Reproductive: Normal  prostate. Other: No abdominopelvic fluid or extraluminal gas. Small fat containing right inguinal hernia. Musculoskeletal: No acute osseous abnormality. IMPRESSION: Persistent, improved asymmetric left rectal wall thickening, favoring proctitis. Improved presacral gas and ill-defined fluid/gas within the eccentric left ischioanal fossa. Findings likely are secondary to a perianal fistula, which is not entirely evaluated on CT. If further imaging characterization is desired, the test of choice is high-resolution pre and post-contrast pelvic MRI. No evidence of terminal ileal or more proximal colonic Crohn disease. Esophageal air fluid level suggests dysmotility or gastroesophageal reflux. Perirectal adenopathy, increased and favored to be reactive. Electronically Signed   By: Rockey Kilts M.D.   On: 09/18/2024 13:37    Anti-infectives: Anti-infectives (From admission, onward)    Start     Dose/Rate Route Frequency Ordered Stop   09/18/24 1830  piperacillin -tazobactam (ZOSYN ) IVPB 3.375 g        3.375 g 12.5 mL/hr over 240 Minutes Intravenous Every 8 hours 09/18/24 1610     09/18/24 0945  piperacillin -tazobactam (ZOSYN ) IVPB 3.375 g        3.375 g 100 mL/hr over 30 Minutes Intravenous  Once 09/18/24 0944 09/18/24 1103        Assessment/Plan -39 year old male with history of perianal fistulizing Crohn's disease with recurrent perianal abscesses that presented for rectal pain and drainage. Imaging concerning for proctitis and fluid/abscess. Hx of osteomyelitis.  -s/p EUA with internal drainage of left posterior perirectal abscess 03/2021, s/p modified hanley procedure drainage of high anal fistula with seton placement, coccygectomy 06/2021, and s/p transrectal drainage replacement of seton excision of perianeal sinus tract exam under anesthesia 12/2021.   -Currently afebrile. -WBC improved to 7.6 from 10.6 -Patient with tenderness of left gluteal region, anus/rectal region (improved on exam this  AM). -Discussed and reviewed work up with patient. At this time, no worsening symptoms. Patient has had some improvement. No surgical intervention planned at this time.  -Continue with abx per ID. -Patient on CLD. Okay to advance as tolerated from general surgery standpoint. -Will follow along with you.    FEN: CLD; IVF per primary team VTE: SCDs ID: [email or web address]     LOS: 2 days   I reviewed consulting provider notes, specialist notes, hospitalist notes, nursing notes, last 24 h vitals and pain scores, last 48 h intake and output, last 24 h labs and trends, and last 24 h imaging results.  This care required moderate level of medical decision making.   Marjorie Carlyon Favre, [facility] 09/20/2024, 8:13 AM Please see Amion for pager number during day hours 7:00am-4:30pm  "

## 2024-09-20 NOTE — Progress Notes (Signed)
 "       Subjective: No new complaints, pain is better   Antibiotics:  Anti-infectives (From admission, onward)    Start     Dose/Rate Route Frequency Ordered Stop   09/20/24 2200  metroNIDAZOLE  (FLAGYL ) tablet 500 mg        500 mg Oral Every 12 hours 09/20/24 1611     09/20/24 1700  levofloxacin  (LEVAQUIN ) tablet 750 mg        750 mg Oral Daily 09/20/24 1611     09/18/24 1830  piperacillin -tazobactam (ZOSYN ) IVPB 3.375 g  Status:  Discontinued        3.375 g 12.5 mL/hr over 240 Minutes Intravenous Every 8 hours 09/18/24 1610 09/20/24 1611   09/18/24 0945  piperacillin -tazobactam (ZOSYN ) IVPB 3.375 g        3.375 g 100 mL/hr over 30 Minutes Intravenous  Once 09/18/24 0944 09/18/24 1103       Medications: Scheduled Meds:  levofloxacin   750 mg Oral Daily   LORazepam   0.5 mg Intravenous Once   metroNIDAZOLE   500 mg Oral Q12H   senna-docusate  1 tablet Oral BID   Continuous Infusions: PRN Meds:.acetaminophen  **OR** acetaminophen , ondansetron  **OR** ondansetron  (ZOFRAN ) IV, oxyCODONE     Objective: Weight change:   Intake/Output Summary (Last 24 hours) at 09/20/2024 1857 Last data filed at 09/20/2024 1500 Gross per 24 hour  Intake 436.99 ml  Output --  Net 436.99 ml   Blood pressure (!) 139/90, pulse 80, temperature 98 F (36.7 C), temperature source Oral, resp. rate 16, height 6' 1 (1.854 m), weight 88.8 kg, SpO2 99%. Temp:  [98 F (36.7 C)-98.6 F (37 C)] 98 F (36.7 C) (01/29 1253) Pulse Rate:  [76-80] 80 (01/29 1253) Resp:  [16-18] 16 (01/29 1253) BP: (116-139)/(71-90) 139/90 (01/29 1253) SpO2:  [96 %-99 %] 99 % (01/29 1253)  Physical Exam: Physical Exam Constitutional:      Appearance: Normal appearance.  HENT:     Head: Normocephalic and atraumatic.  Eyes:     General:        Right eye: No discharge.        Left eye: No discharge.     Extraocular Movements: Extraocular movements intact.     Pupils: Pupils are equal, round, and reactive to light.   Cardiovascular:     Rate and Rhythm: Normal rate and regular rhythm.  Pulmonary:     Effort: No respiratory distress.     Breath sounds: No wheezing.  Abdominal:     General: There is no distension.  Musculoskeletal:        General: Normal range of motion.     Cervical back: Normal range of motion and neck supple.  Skin:    General: Skin is warm and dry.  Neurological:     General: No focal deficit present.     Mental Status: He is alert and oriented to person, place, and time.  Psychiatric:        Mood and Affect: Mood normal.        Behavior: Behavior normal.        Thought Content: Thought content normal.        Judgment: Judgment normal.      CBC:    BMET Recent Labs    09/19/24 0625 09/20/24 0552  NA 139 140  K 4.2 4.2  CL 105 107  CO2 23 24  GLUCOSE 87 96  BUN 14 14  CREATININE 1.78* 1.62*  CALCIUM  8.5* 8.5*  Liver Panel  Recent Labs    09/18/24 0955 09/19/24 0625  PROT 7.8 7.0  ALBUMIN  3.5 3.2*  AST 18 26  ALT 13 24  ALKPHOS 134* 116  BILITOT 0.4 0.6       Sedimentation Rate Recent Labs    09/19/24 0625  ESRSEDRATE 72*   C-Reactive Protein Recent Labs    09/18/24 0955 09/19/24 0625  CRP 10.3* 15.1*    Micro Results: Recent Results (from the past 720 hours)  Culture, blood (routine x 2)     Status: None (Preliminary result)   Collection Time: 09/18/24 10:10 AM   Specimen: BLOOD  Result Value Ref Range Status   Specimen Description   Final    BLOOD LEFT ANTECUBITAL Performed at [facility], [address] [place], [place] [address]    Special Requests   Final    BOTTLES DRAWN AEROBIC AND ANAEROBIC Blood Culture adequate volume Performed at [facility], [address] [place], [place] [address]    Culture   Final    NO GROWTH 2 DAYS Performed at [facility], [address] [place], [place] [address]    Report Status PENDING  Incomplete  Culture, blood (routine x 2)      Status: None (Preliminary result)   Collection Time: 09/18/24 10:20 AM   Specimen: BLOOD  Result Value Ref Range Status   Specimen Description   Final    BLOOD RIGHT ANTECUBITAL Performed at [facility], [address], [place], [place] [address]    Special Requests   Final    BOTTLES DRAWN AEROBIC AND ANAEROBIC Blood Culture adequate volume Performed at [facility], [address], [place], [place] [address]    Culture   Final    NO GROWTH 2 DAYS Performed at [facility], [address] [place], [place] [address]    Report Status PENDING  Incomplete    Studies/Results: No results found.    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 male with perianal fistulizing Corhns disease  complicated by recurrent perianal abscesses s/p EUA with seton placement, coccygeal osteomyelitis s/p modified Hanley procedure and coccygectomy. Admitted 09/18/2024 with rectal pain and purulent rectal drainage. WBC 15.2 -> 10.6 -> 7.6.  CRP 15.1. CTAP with contrast showed evidence of a perianal fistula and left rectal wall thickening suggestive of proctitis. 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   #1 Intrabdominal abscesses:  No surgery planned Could IR access some of this for culture and to reduce size of abscess?  If we switched to oral therapy would likely do levaquin  and flagyl   and give this for a month with repeat imaging prior to stopping antibiotics  #2 Chronic pelvic osteomyelitis: given chronicity of these findings, prior surgery, I do not have enthusiasm that we can cure or normalize these findings  I personally spent a total of 51 minutes in the care of the patient today including preparing to see the patient, getting/reviewing separately obtained history, performing a medically appropriate exam/evaluation, counseling and educating, placing orders, referring and communicating with other health care professionals, documenting clinical information in the EHR, and independently interpreting results.   Evaluation of the patient requires complex antimicrobial therapy evaluation, counseling , isolation needs to reduce disease transmission and risk assessment and mitigation.      LOS: 2 days   Shawn [name] 09/20/2024, 6:57 PM  "

## 2024-09-20 NOTE — Progress Notes (Addendum)
 "    [facility] Progress Note  CC:  Rectal pain, perianal Crohn's disease   Subjective: He tolerated a soft diet this morning for breakfast.  No nausea or vomiting.  No abdominal pain.  His rectal pain has decreased.  No BM since admission.  He is passing gas per rectum.  Overall, he endorses feeling better today.   Objective:  Vital signs in last 24 hours: Temp:  [98.6 F (37 C)-99 F (37.2 C)] 98.6 F (37 C) (01/29 0555) Pulse Rate:  [76-80] 76 (01/29 0555) Resp:  [16-18] 18 (01/29 0555) BP: (116-129)/(71-83) 116/77 (01/29 0555) SpO2:  [96 %-99 %] 97 % (01/29 0555) Weight:  [88.8 kg] 88.8 kg (01/28 1222) Last BM Date : 09/16/24 General: Alert 39 year old male in no acute distress. Heart: Regular rate and rhythm, no murmurs. Pulm: Breath sounds clear throughout.  On room air Abdomen: Soft, nondistended.  Nontender.  Positive bowel sounds to all 4 quadrants. Extremities: No lower extremity edema. Neurologic:  Alert and oriented x 4. Grossly normal neurologically. Psych:  Alert and cooperative. Normal mood and affect.  Intake/Output from previous day: 01/28 0701 - 01/29 0700 In: 860.1 [P.O.:720; IV Piggyback:140.1] Out: -  Intake/Output this shift: No intake/output data recorded.  Lab Results: Recent Labs    09/18/24 0955 09/19/24 0625 09/20/24 0552  WBC 15.2* 10.6* 7.6  HGB 11.9* 11.7* 11.2*  HCT 35.8* 35.3* 34.4*  PLT 301 288 284   BMET Recent Labs    09/18/24 0955 09/19/24 0625 09/20/24 0552  NA 136 139 140  K 3.7 4.2 4.2  CL 103 105 107  CO2 23 23 24   GLUCOSE 102* 87 96  BUN 19 14 14   CREATININE 1.53* 1.78* 1.62*  CALCIUM  8.7* 8.5* 8.5*   LFT Recent Labs    09/19/24 0625  PROT 7.0  ALBUMIN  3.2*  AST 26  ALT 24  ALKPHOS 116  BILITOT 0.6   PT/INR No results for input(s): LABPROT, INR in the last 72 hours. Hepatitis Panel No results for input(s): HEPBSAG, HCVAB, HEPAIGM, HEPBIGM in the last 72 hours.  MR PELVIS W  WO CONTRAST Result Date: 09/18/2024 EXAM: MRI OF THE PELVIS WITH AND WITHOUT INTRAVENOUS CONTRAST 09/18/2024 03:24:34 PM TECHNIQUE: Multiplanar magnetic resonance images of the pelvis with and without intravenous contrast. 9 mL (gadobutrol  (GADAVIST ) 1 MMOL/ML injection 9 mL GADOBUTROL  1 MMOL/ML IV SOLN). COMPARISON: 03/11/2024. CLINICAL HISTORY: Crohn disease with perianal region fistula. FINDINGS: SOFT TISSUES: Mild perirectal stranding. Posterior perianal fistula at the 6 o'clock position originating from the lower anal canal (image 26 of series 10) and extending cephalad at the 6 o'clock position into the presacral space. Reduced size of the presacral abscess, currently measuring 5.6 x 2.6 x 0.8 cm (volume = 6 cm\S\3), previously measuring 6.7 x 1.1 x 4.5 cm (volume = 17 cm\S\3). Previously associated extension through the sacrococcygeal foramina is improved although not completely resolved, with inflammatory stranding and enhancement extending along the lower sacral and sacrococcygeal foramina. There is also a small amount of abscess extension below the left piriformis muscle and deep to the left gluteus maximus muscle with this particular loculation measuring 2.6 x 1.7 x 3.1 cm (volume = 7.2 cm\S\3). A branch of the perianal fistula also tracks superiorly behind the rectum and attaches to the rectum at the 5 o'clock position (image 15 of series 6). Suspected seton posterior to the anal canal (image 31 series 2). PET 124. There is edema signal inferiorly in both piriformis muscles, throughout much of  the medial left gluteus maximus, and to a lesser extent in the right medial gluteus maximus, compatible with chronic myositis. No overt inflammatory findings involving the sciatic nerves. Today's exam was not protocoled as a musculoskeletal protocol exam but rather utilized the anorectal fistula protocol. JOINTS: Unremarkable. No dislocation or significant effusion. LIMITED INTRAPELVIC CONTENTS: Mildly prominent  perirectal lymph nodes measuring up to 0.7 cm in long axis on image 7 series 6, similar overall to prior. 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 No acute fracture. IMPRESSION: 1. Posterior perianal fistula at the 6 o'clock position extends cephalad with a branch communicating with the rectum at the 5 o'clock position, and a branch extending into a presacral abscess, which is currently reduced in size to 6 cm\S\3 (previously 17 cc). Improved but persistent extension through the sacrococcygeal foramina and additional abscess extension below the left piriformis and deep to the left gluteus maximus measuring 7.2 cm\S\3, with mild perirectal stranding and suspected seton posterior to the anal canal. 2. 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 3. 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 Electronically signed by: Ryan Salvage MD 09/18/2024 04:19 PM EST RP Workstation: [id number]   CT ABDOMEN PELVIS W CONTRAST Result Date: 09/18/2024 CLINICAL DATA:  Crohn's exacerbation. Concern for anal fistula. Symptoms for 24 hours. EXAM: CT ABDOMEN AND PELVIS WITH CONTRAST TECHNIQUE: Multidetector CT imaging of the abdomen and pelvis was performed using the standard protocol following bolus administration of intravenous contrast. RADIATION DOSE REDUCTION: This exam was performed according to the departmental dose-optimization program which includes automated exposure control, adjustment of the mA and/or kV according to patient size and/or use of iterative reconstruction technique. CONTRAST:  OMNIPAQUE  IOHEXOL  300 MG/ML  SOLN COMPARISON:  03/19/2024 CT and MRI. FINDINGS: Lower chest: Volume loss and subsegmental atelectasis in the dependent right lung. Normal heart size without pericardial or pleural effusion. Mild to moderate right hemidiaphragm elevation. Fluid or debris within the distal esophagus  including on image 9/12. Hepatobiliary: Normal liver. Normal gallbladder, without biliary ductal dilatation. Pancreas: Normal, without mass or ductal dilatation. Spleen: Normal in size, without focal abnormality. Adrenals/Urinary Tract: Normal adrenal glands. Bilateral hypoattenuating renal lesions. Larger lesions, including up to 4.1 cm, most consistent with cysts and minimally complex cysts. Other lesions are too small to characterize but most likely cysts. No follow-up indicated. No hydronephrosis.  Normal urinary bladder. Stomach/Bowel: Normal stomach, without wall thickening. Again identified is a midline seton. Mild asymmetric left rectal wall thickening including on image 83/3. Mildly improved since comparison CT. Trace presacral gas, decreased on image 75/3. The extent of ill-defined fluid and edema within the midline and left ischioanal fossa is decreased. Persistent relatively similar small volume gas including on image 81/3. Normal terminal ileum and appendix. Normal small bowel. Vascular/Lymphatic: Normal aortic caliber. Perirectal adenopathy including at 1.0 cm on 74/3. Increased from 7 mm on the prior. Reproductive: Normal prostate. Other: No abdominopelvic fluid or extraluminal gas. Small fat containing right inguinal hernia. Musculoskeletal: No acute osseous abnormality. IMPRESSION: Persistent, improved asymmetric left rectal wall thickening, favoring proctitis. Improved presacral gas and ill-defined fluid/gas within the eccentric left ischioanal fossa. Findings likely are secondary to a perianal fistula, which is not entirely evaluated on CT. If further imaging characterization is desired, the test of choice is high-resolution pre and post-contrast pelvic MRI. No evidence of terminal ileal or more proximal colonic Crohn disease. Esophageal air fluid level suggests dysmotility or gastroesophageal reflux. Perirectal  adenopathy, increased and favored to be reactive. Electronically Signed   By: Rockey Kilts M.D.   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 Admitted 09/18/2024 with rectal pain and purulent rectal drainage. WBC 15.2 -> 10.6 -> 7.6.  CRP 15.1. CTAP with contrast showed evidence of a perianal fistula and left rectal wall thickening suggestive of proctitis. 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 Evaluated by colorectal surgery, no indication for I&D or other surgical intervention.  Afebrile today. Hemodynamically stable. - Continue IV Zosyn  per ID - Soft diet - IV fluids and pain management per the hospitalist - Continue Senokot-S 1 tab twice daily (patient does not tolerate MiraLAX ) - Patient instructed to avoid ibuprofen /NSAIDs - Await further recommendations per Dr. Federico   Mild anemia, suspect secondary to chronic disease plus B12 deficiency. Hg 11.9 -> 11.7 -> 11.2.  No overt GI bleeding. 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 I performed a substantive portion of this encounter (greater than 50%), including complete performance of at least one of the key components, in conjunction with the APP. I agree with the APP's note, impression and recommendations with additional input as follows.  Leukocytosis has resolved and rectal pain has improved. Suspect antibiotics are working. Surgery does not want to offer any further surgical intervention at this time. No plans for any IV steroids or changes in biologic therapy at this time since I do think most of his symptoms are due to his difficult to control infections (abscesses, osteomyelitis). Hopefully he will respond to a prolonged course of antibiotics but will defer to ID about the length of an antibiotics course. Discussed with his primary GI Dr. Suzann and we will plan to hold his Stelara  until his follow up appt with Dr. Suzann on 10/03/24.   Estefana Federico, MD "

## 2024-09-20 NOTE — Plan of Care (Signed)

## 2024-09-20 NOTE — Progress Notes (Signed)
 " PROGRESS NOTE    Shawn [name]  FMW:[id number] DOB: 30-Aug-1985 DOA: 09/18/2024 PCP: [name], FNP   Brief Narrative:  This 39 years old male with PMH significant for Crohn's disease, history of perirectal abscess, B12 deficiency, dilated cardiomyopathy, hypertension presented in the ED with complaints of having an abscess in the perianal area.  Patient denies any fever, chills, other symptoms.  Workup in the ED reveals WBC 15.2, sed rate 58, CMP shows normal electrolytes, elevated AST and ALT.  Serum creatinine 1.53. CT A/P showed persistent, but improved asymmetric left rectal wall thickening, favoring proctitis. Improved presacral gas and ill-defined fluid/gas within the eccentric left ischioanal fossa. Findings likely are secondary to perianal fistula, which is not entirely evaluated on CT. No evidence of terminal ileal or more proximal colonic Crohn's disease. Esophageal air-fluid level suggest dysmotility or GERD. Perirectal adenopathy, increased and favored to be reactive.  Patient admitted for further evaluation initiated on IV antibiotics.  General Surgery and gastroenterology is consulted.  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 CT A/P shows findings consistent with perianal fistula. Patient initiated on IV Zosyn  3.375 g every 8 hours. Continue adequate pain control with pain medication. Continue antiemetics as needed. GI, general surgery and ID is consulted. ID  consult appreciated,  recommended to continue Zosyn .  Would hold Biologics in near future. GI recommended to start clear liquid diet,  IV pain medications and pain control. General surgery consult appreciated.  No plans for any surgical intervention needed. Continue IV hydration, pain control, bowel regimen.  Essential hypertension: Blood pressure is normotensive. Not taking any antihypertensive medications.  CKD stage IIIa: Serum creatinine at baseline. Avoid nephrotoxic medications.  Chronic disease anemia: Monitor H&H.  No obvious visible bleeding.  Thrombocytosis : > Improved. likely in the setting of anemia and infection.   Continue to monitor platelet count.   DVT prophylaxis: SCDs Code Status: Full code Family Communication: No family at bed side. Disposition Plan:     Status is: Inpatient Remains inpatient appropriate because: Patient admitted with rectal pain found to have perirectal abscess with fistula.  General surgery,  ID and GI is consulted.  Patient is not medically ready for discharge.    Consultants:  GI General Surgery ID  Procedures: MRI pelvis Antimicrobials:  Anti-infectives (From admission, onward)    Start     Dose/Rate Route Frequency Ordered Stop   09/18/24 1830  piperacillin -tazobactam (ZOSYN ) IVPB 3.375 g        3.375 g 12.5 mL/hr over 240 Minutes Intravenous Every 8 hours 09/18/24 1610     09/18/24 0945  piperacillin -tazobactam (ZOSYN ) IVPB 3.375 g        3.375 g 100 mL/hr over 30 Minutes Intravenous  Once 09/18/24 0944 09/18/24 1103      Subjective: Patient was seen and examined at bedside.  Overnight events noted. Patient reports rectal pain has improved.  Had a fever within last 24 hours. Denies any bleeding.  Tolerating clear liquid diet,  wants to advance it.   Objective: Vitals:  09/19/24 1222 09/19/24 1317 09/19/24 2038 09/20/24 0555  BP:  129/83 121/71 116/77  Pulse:  80 79 76  Resp:  16 18 18   Temp:  99 F (37.2 C)  98.6 F (37 C) 98.6 F (37 C)  TempSrc:  Oral Oral Oral  SpO2:  99% 96% 97%  Weight: 88.8 kg     Height: 6' 1 (1.854 m)       Intake/Output Summary (Last 24 hours) at 09/20/2024 1130 Last data filed at 09/20/2024 0900 Gross per 24 hour  Intake 860.06 ml  Output --  Net 860.06 ml   Filed Weights   09/19/24 1222  Weight: 88.8 kg    Examination:  General exam: Appears calm and comfortable, not in any acute distress. Respiratory system: CTA Bilaterally. Respiratory effort normal.  RR15 Cardiovascular system: S1 & S2 heard, RRR. No JVD, murmurs, rubs, gallops or clicks. Gastrointestinal system: Abdomen is non distended, soft and non tender.  Normal bowel sounds heard. Central nervous system: Alert and oriented x 3. No focal neurological deficits. Extremities: No edema, no cyanosis, no clubbing. Skin: No rashes, lesions or ulcers Psychiatry: Judgement and insight appear normal. Mood & affect appropriate.   Data Reviewed: I have personally reviewed following labs and imaging studies  CBC: Recent Labs  Lab 09/18/24 0955 09/19/24 0625 09/20/24 0552  WBC 15.2* 10.6* 7.6  NEUTROABS 10.6*  --   --   HGB 11.9* 11.7* 11.2*  HCT 35.8* 35.3* 34.4*  MCV 80.4 80.4 82.1  PLT 301 288 284   Basic Metabolic Panel: Recent Labs  Lab 09/18/24 0955 09/19/24 0625 09/20/24 0552  NA 136 139 140  K 3.7 4.2 4.2  CL 103 105 107  CO2 23 23 24   GLUCOSE 102* 87 96  BUN 19 14 14   CREATININE 1.53* 1.78* 1.62*  CALCIUM  8.7* 8.5* 8.5*  MG  --   --  2.4  PHOS  --   --  3.1   GFR: Estimated Creatinine Clearance: 68.5 mL/min (A) (by C-G formula based on SCr of 1.62 mg/dL (H)). Liver Function Tests: Recent Labs  Lab 09/18/24 0955 09/19/24 0625  AST 18 26  ALT 13 24  ALKPHOS 134* 116  BILITOT 0.4 0.6  PROT 7.8 7.0  ALBUMIN  3.5 3.2*   No results for input(s): LIPASE, AMYLASE in the last 168 hours. No results for input(s): AMMONIA in the last 168 hours. Coagulation Profile: No  results for input(s): INR, PROTIME in the last 168 hours. Cardiac Enzymes: No results for input(s): CKTOTAL, CKMB, CKMBINDEX, TROPONINI in the last 168 hours. BNP (last 3 results) No results for input(s): PROBNP in the last 8760 hours. HbA1C: No results for input(s): HGBA1C in the last 72 hours. CBG: No results for input(s): GLUCAP in the last 168 hours. Lipid Profile: No results for input(s): CHOL, HDL, LDLCALC, TRIG, CHOLHDL, LDLDIRECT in the last 72 hours. Thyroid  Function Tests: No results for input(s): TSH, T4TOTAL, FREET4, T3FREE, THYROIDAB in the last 72 hours. Anemia Panel: No results for input(s): VITAMINB12, FOLATE, FERRITIN, TIBC, IRON , RETICCTPCT in the last 72 hours. Sepsis Labs: No results for input(s): PROCALCITON, LATICACIDVEN in the last 168 hours.  Recent Results (from the past 240 hours)  Culture, blood (routine x 2)     Status: None (Preliminary result)   Collection Time: 09/18/24 10:10 AM   Specimen: BLOOD  Result Value Ref Range Status   Specimen Description   Final    BLOOD LEFT ANTECUBITAL Performed at [facility], [address] [place], [place] [address]    Special Requests   Final    BOTTLES DRAWN AEROBIC AND ANAEROBIC Blood Culture adequate volume Performed at [facility], [address], [place], [place] [address]    Culture   Final    NO GROWTH 2 DAYS Performed at [facility], [address], [place], [place] [address]    Report Status PENDING  Incomplete  Culture, blood (routine x 2)     Status: None (Preliminary result)   Collection Time: 09/18/24 10:20 AM   Specimen: BLOOD  Result Value Ref Range Status   Specimen Description   Final    BLOOD RIGHT ANTECUBITAL Performed at [facility], [address] [place], [place] [address]    Special Requests   Final    BOTTLES DRAWN AEROBIC AND ANAEROBIC Blood Culture adequate  volume Performed at [facility], [address] [place], [place] [address]    Culture   Final    NO GROWTH 2 DAYS Performed at [facility], [address], [place], [place] [address]    Report Status PENDING  Incomplete         Radiology Studies: MR PELVIS W WO CONTRAST Result Date: 09/18/2024 EXAM: MRI OF THE PELVIS WITH AND WITHOUT INTRAVENOUS CONTRAST 09/18/2024 03:24:34 PM TECHNIQUE: Multiplanar magnetic resonance images of the pelvis with and without intravenous contrast. 9 mL (gadobutrol  (GADAVIST ) 1 MMOL/ML injection 9 mL GADOBUTROL  1 MMOL/ML IV SOLN). COMPARISON: 03/11/2024. CLINICAL HISTORY: Crohn disease with perianal region fistula. FINDINGS: SOFT TISSUES: Mild perirectal stranding. Posterior perianal fistula at the 6 o'clock position originating from the lower anal canal (image 26 of series 10) and extending cephalad at the 6 o'clock position into the presacral space. Reduced size of the presacral abscess, currently measuring 5.6 x 2.6 x 0.8 cm (volume = 6 cm\S\3), previously measuring 6.7 x 1.1 x 4.5 cm (volume = 17 cm\S\3). Previously associated extension through the sacrococcygeal foramina is improved although not completely resolved, with inflammatory stranding and enhancement extending along the lower sacral and sacrococcygeal foramina. There is also a small amount of abscess extension below the left piriformis muscle and deep to the left gluteus maximus muscle with this particular loculation measuring 2.6 x 1.7 x 3.1 cm (volume = 7.2 cm\S\3). A branch of the perianal fistula also tracks superiorly behind the rectum and attaches to the rectum at the 5 o'clock position (image 15 of series 6). Suspected seton posterior to the anal canal (image 31 series 2). PET 124. There is edema signal inferiorly in both piriformis muscles, throughout much of the medial left gluteus maximus, and to a lesser extent in the right medial gluteus maximus, compatible with  chronic myositis. No overt inflammatory findings involving the sciatic nerves. Today's exam was not protocoled as a musculoskeletal protocol exam but rather utilized the anorectal fistula protocol. JOINTS: Unremarkable. No dislocation or significant effusion. LIMITED INTRAPELVIC CONTENTS: Mildly prominent perirectal lymph nodes measuring up to 0.7 cm in long axis on image 7 series 6, similar overall to prior. 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 No acute fracture. IMPRESSION: 1. Posterior perianal fistula at the 6 o'clock position extends cephalad with a branch communicating with the rectum at the 5 o'clock position, and a branch extending into a presacral abscess, which is currently reduced in size to 6 cm\S\3 (previously 17 cc). Improved but persistent extension through the sacrococcygeal foramina and additional abscess extension below the left piriformis  and deep to the left gluteus maximus measuring 7.2 cm\S\3, with mild perirectal stranding and suspected seton posterior to the anal canal. 2. 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 3. 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 Electronically signed by: Ryan Salvage MD 09/18/2024 04:19 PM EST RP Workstation: [id number]   CT ABDOMEN PELVIS W CONTRAST Result Date: 09/18/2024 CLINICAL DATA:  Crohn's exacerbation. Concern for anal fistula. Symptoms for 24 hours. EXAM: CT ABDOMEN AND PELVIS WITH CONTRAST TECHNIQUE: Multidetector CT imaging of the abdomen and pelvis was performed using the standard protocol following bolus administration of intravenous contrast. RADIATION DOSE REDUCTION: This exam was performed according to the departmental dose-optimization program which includes automated exposure control, adjustment of the mA and/or kV according to patient size and/or use of iterative reconstruction technique. CONTRAST:   OMNIPAQUE  IOHEXOL  300 MG/ML  SOLN COMPARISON:  03/19/2024 CT and MRI. FINDINGS: Lower chest: Volume loss and subsegmental atelectasis in the dependent right lung. Normal heart size without pericardial or pleural effusion. Mild to moderate right hemidiaphragm elevation. Fluid or debris within the distal esophagus including on image 9/12. Hepatobiliary: Normal liver. Normal gallbladder, without biliary ductal dilatation. Pancreas: Normal, without mass or ductal dilatation. Spleen: Normal in size, without focal abnormality. Adrenals/Urinary Tract: Normal adrenal glands. Bilateral hypoattenuating renal lesions. Larger lesions, including up to 4.1 cm, most consistent with cysts and minimally complex cysts. Other lesions are too small to characterize but most likely cysts. No follow-up indicated. No hydronephrosis.  Normal urinary bladder. Stomach/Bowel: Normal stomach, without wall thickening. Again identified is a midline seton. Mild asymmetric left rectal wall thickening including on image 83/3. Mildly improved since comparison CT. Trace presacral gas, decreased on image 75/3. The extent of ill-defined fluid and edema within the midline and left ischioanal fossa is decreased. Persistent relatively similar small volume gas including on image 81/3. Normal terminal ileum and appendix. Normal small bowel. Vascular/Lymphatic: Normal aortic caliber. Perirectal adenopathy including at 1.0 cm on 74/3. Increased from 7 mm on the prior. Reproductive: Normal prostate. Other: No abdominopelvic fluid or extraluminal gas. Small fat containing right inguinal hernia. Musculoskeletal: No acute osseous abnormality. IMPRESSION: Persistent, improved asymmetric left rectal wall thickening, favoring proctitis. Improved presacral gas and ill-defined fluid/gas within the eccentric left ischioanal fossa. Findings likely are secondary to a perianal fistula, which is not entirely evaluated on CT. If further imaging characterization is desired,  the test of choice is high-resolution pre and post-contrast pelvic MRI. No evidence of terminal ileal or more proximal colonic Crohn disease. Esophageal air fluid level suggests dysmotility or gastroesophageal reflux. Perirectal adenopathy, increased and favored to be reactive. Electronically Signed   By: Rockey Kilts M.D.   On: 09/18/2024 13:37   Scheduled Meds:  LORazepam   0.5 mg Intravenous Once   senna-docusate  1 tablet Oral BID   Continuous Infusions:  piperacillin -tazobactam (ZOSYN )  IV 3.375 g (09/20/24 0937)     LOS: 2 days    Time spent: 35 mins    Darcel Dawley, MD [facility]   If 7PM-7AM, please contact night-coverage  "

## 2024-09-21 ENCOUNTER — Inpatient Hospital Stay: Attending: Nurse Practitioner

## 2024-09-21 ENCOUNTER — Other Ambulatory Visit (HOSPITAL_COMMUNITY): Payer: Self-pay

## 2024-09-21 DIAGNOSIS — N182 Chronic kidney disease, stage 2 (mild): Secondary | ICD-10-CM | POA: Diagnosis not present

## 2024-09-21 DIAGNOSIS — E538 Deficiency of other specified B group vitamins: Secondary | ICD-10-CM | POA: Insufficient documentation

## 2024-09-21 DIAGNOSIS — D649 Anemia, unspecified: Secondary | ICD-10-CM | POA: Diagnosis not present

## 2024-09-21 DIAGNOSIS — K50113 Crohn's disease of large intestine with fistula: Secondary | ICD-10-CM | POA: Diagnosis not present

## 2024-09-21 DIAGNOSIS — K603 Anal fistula, unspecified: Secondary | ICD-10-CM | POA: Diagnosis not present

## 2024-09-21 LAB — BASIC METABOLIC PANEL WITH GFR
Anion gap: 10 (ref 5–15)
BUN: 14 mg/dL (ref 6–20)
CO2: 22 mmol/L (ref 22–32)
Calcium: 9 mg/dL (ref 8.9–10.3)
Chloride: 107 mmol/L (ref 98–111)
Creatinine, Ser: 1.52 mg/dL — ABNORMAL HIGH (ref 0.61–1.24)
GFR, Estimated: 59 mL/min — ABNORMAL LOW
Glucose, Bld: 122 mg/dL — ABNORMAL HIGH (ref 70–99)
Potassium: 3.9 mmol/L (ref 3.5–5.1)
Sodium: 139 mmol/L (ref 135–145)

## 2024-09-21 LAB — CBC
HCT: 40.5 % (ref 39.0–52.0)
Hemoglobin: 13.2 g/dL (ref 13.0–17.0)
MCH: 27.1 pg (ref 26.0–34.0)
MCHC: 32.6 g/dL (ref 30.0–36.0)
MCV: 83.2 fL (ref 80.0–100.0)
Platelets: 316 10*3/uL (ref 150–400)
RBC: 4.87 MIL/uL (ref 4.22–5.81)
RDW: 16.7 % — ABNORMAL HIGH (ref 11.5–15.5)
WBC: 6.6 10*3/uL (ref 4.0–10.5)
nRBC: 0 % (ref 0.0–0.2)

## 2024-09-21 MED ORDER — METRONIDAZOLE 500 MG PO TABS
500.0000 mg | ORAL_TABLET | Freq: Two times a day (BID) | ORAL | 0 refills | Status: AC
  Filled 2024-09-21: qty 60, 30d supply, fill #0

## 2024-09-21 MED ORDER — CYANOCOBALAMIN 1000 MCG/ML IJ SOLN
1000.0000 ug | Freq: Once | INTRAMUSCULAR | Status: AC
  Administered 2024-09-21: 1000 ug via INTRAMUSCULAR
  Filled 2024-09-21: qty 1

## 2024-09-21 MED ORDER — OXYCODONE HCL 10 MG PO TABS
10.0000 mg | ORAL_TABLET | Freq: Four times a day (QID) | ORAL | 0 refills | Status: AC | PRN
  Filled 2024-09-21: qty 10, 3d supply, fill #0

## 2024-09-21 MED ORDER — LEVOFLOXACIN 750 MG PO TABS
750.0000 mg | ORAL_TABLET | Freq: Every day | ORAL | 0 refills | Status: AC
  Filled 2024-09-21: qty 30, 30d supply, fill #0

## 2024-09-21 NOTE — Progress Notes (Signed)
 Discharge meds in a secure bag delivered to patient in the main lobby by this RN at 1115

## 2024-09-21 NOTE — Progress Notes (Addendum)
 "    [facility] Progress Note  CC:  Rectal pain, perianal Crohn's disease   Subjective: He endorses feeling a lot better today.  Rectal pain has significantly decreased.  No BM since admission.  He is passing gas per the rectum.  No abdominal pain.   Objective:  Vital signs in last 24 hours: Temp:  [98 F (36.7 C)-98.3 F (36.8 C)] 98.3 F (36.8 C) (01/30 0618) Pulse Rate:  [58-80] 58 (01/30 0618) Resp:  [14-16] 14 (01/30 0618) BP: (126-139)/(86-90) 126/86 (01/30 0618) SpO2:  [99 %-100 %] 100 % (01/30 0618) Last BM Date : 09/16/24 General: Alert 39 year old male sitting at the bedside in no acute distress. Heart: Regular rate and rhythm, no murmurs. Pulm: Breath sounds clear throughout. Abdomen: Soft, nondistended.  Nontender.  Positive bowel sounds to all 4 quadrants. Extremities: No lower extremity edema. Neurologic:  Alert and oriented x 4. Grossly normal neurologically. Psych:  Alert and cooperative. Normal mood and affect.  Intake/Output from previous day: 01/29 0701 - 01/30 0700 In: 296.9 [P.O.:240; IV Piggyback:56.9] Out: -  Intake/Output this shift: No intake/output data recorded.  Lab Results: Recent Labs    09/18/24 0955 09/19/24 0625 09/20/24 0552  WBC 15.2* 10.6* 7.6  HGB 11.9* 11.7* 11.2*  HCT 35.8* 35.3* 34.4*  PLT 301 288 284   BMET Recent Labs    09/18/24 0955 09/19/24 0625 09/20/24 0552  NA 136 139 140  K 3.7 4.2 4.2  CL 103 105 107  CO2 23 23 24   GLUCOSE 102* 87 96  BUN 19 14 14   CREATININE 1.53* 1.78* 1.62*  CALCIUM  8.7* 8.5* 8.5*   LFT Recent Labs    09/19/24 0625  PROT 7.0  ALBUMIN  3.2*  AST 26  ALT 24  ALKPHOS 116  BILITOT 0.6   PT/INR No results for input(s): LABPROT, INR in the last 72 hours. Hepatitis Panel No results for input(s): HEPBSAG, HCVAB, HEPAIGM, HEPBIGM in the last 72 hours.  No results found.  Assessment / Plan:  38 year old male with severe perianal fistulizing Crohn's  disease diagnosed in 2021 complicated by recurrent perianal abscesses s/p EUA with seton placement, coccygeal osteomyelitis s/p modified Hanley procedure and coccygectomy. Admitted 09/18/2024 with rectal pain and purulent rectal drainage. WBC 15.2 -> 10.6 -> 7.6. Today's CBC pending.  CRP 15.1. Sed rate 72. CTAP with contrast showed evidence of a perianal fistula and left rectal wall thickening suggestive of proctitis. 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 Evaluated by colorectal surgery, no indication for I&D or other surgical intervention.  No fluid collection to drain per IR.  Afebrile today. Hemodynamically stable. - Await today's CBC result - Continue IV Zosyn , eventually switch to oral Levaquin  and Flagyl  x 1 month then repeat imaging prior to stopping antibiotics per ID - Defer GI discharge recommendations to Dr. Federico - Soft diet - IV fluids and pain management per the hospitalist - Continue Senokot-S 1 tab twice daily (patient does not tolerate MiraLAX ) - Patient instructed to avoid ibuprofen /NSAIDs   Mild anemia, suspect secondary to  chronic disease plus B12 deficiency. Hg 11.9 -> 11.7 -> 11.2.  No overt GI bleeding. 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 I performed a substantive portion of this encounter (greater than 50%), including complete performance of at least one of the key components, in conjunction with the APP. I agree with the APP's note, impression and recommendations with additional input as follows.  Rectal pain is better. ID wanted to see if there could be any drainage of the abscess performed by IR. Discussed with IR and they do not think that there is any liquefied abscess by recent CT imaging. Thus his abscesses would not be amenable to drainage. Patient is doing well and wished to go home. He already has follow up with Dr. Suzann on 2/11. Will hold his Stelara  until he is able to be seen in clinic in follow up. ID has switched him to levo/flagyl .  Estefana Kidney, MD  "

## 2024-09-21 NOTE — Discharge Instructions (Signed)
 Advised to follow-up with primary care physician in 1 week. Advised to follow-up with gastroenterology as scheduled. Advised to take Levaquin  750 mg daily and Flagyl  500 mg twice daily for 30 days. Advised to have CTA/P before discontinuation of antibiotics. Follow-up with ID in 2 to 4 weeks.

## 2024-09-21 NOTE — Plan of Care (Signed)
  Problem: Activity: Goal: Risk for activity intolerance will decrease Outcome: Progressing   Problem: Nutrition: Goal: Adequate nutrition will be maintained Outcome: Progressing   Problem: Coping: Goal: Level of anxiety will decrease Outcome: Progressing   Problem: Pain Managment: Goal: General experience of comfort will improve and/or be controlled Outcome: Progressing

## 2024-09-21 NOTE — Discharge Summary (Signed)
 Physician Discharge Summary  Shawn [name] FMW:[id number] DOB: 1985/11/22 DOA: 09/18/2024  PCP: [name], FNP  Admit date: 09/18/2024  Discharge date: 09/21/2024  Admitted From: Home  Disposition:  Home  Recommendations for Outpatient Follow-up:  Follow up with PCP in 1-2 weeks. Please obtain BMP/CBC in one week. Advised to follow-up with gastroenterology as scheduled. Advised to take Levaquin  750 mg daily and Flagyl  500 mg twice daily for 30 days. Advised to have CTA/P before discontinuation of antibiotics. Follow-up with ID in 2 to 4 weeks. Advised to hold his Stelara .  Home Health:None Equipment/Devices:None  Discharge Condition: Stable CODE STATUS:Full code Diet recommendation: Heart Healthy   Brief [facility] Course: This 39 years old male with PMH significant for Crohn's disease, history of perirectal abscess, B12 deficiency, dilated cardiomyopathy, hypertension presented in the ED with complaints of having an abscess in the perianal area.  Patient denies any fever, chills, other symptoms.  Workup in the ED reveals WBC 15.2, sed rate 58, CMP shows normal electrolytes, elevated AST and ALT.  Serum creatinine 1.53. CT A/P showed persistent, but improved asymmetric left rectal wall thickening, favoring proctitis. Improved presacral gas and ill-defined fluid/gas within the eccentric left ischioanal fossa. Findings likely are secondary to perianal fistula, which is not entirely evaluated on CT. No evidence of terminal ileal or more proximal colonic Crohn's disease. Esophageal air-fluid level suggest dysmotility or GERD. Perirectal adenopathy, increased and favored to be reactive.  Patient admitted for further evaluation initiated on IV antibiotics.  General Surgery and gastroenterology is consulted.  MRI pelvic spine shows chronic osteomyelitis involving sacrum and lower pelvis.  ID was consulted. Patient was continued on IV antibiotics. Patient has made significant  improvement,  nausea and vomiting has improved,  abdominal pain is improved. Patient wanted to be discharged home.  General surgery signed off,  recommended no intervention needed at this point.  IR was consulted for concerning for abscess , states site is not amenable for abscess drainage. Patient feels better and wants to be discharged. ID recommended continuing Levaquin  750 mg daily and Flagyl  twice daily for 30 days.  Advised to have CT abdomen/ pelvis before discontinuation of antibiotics.  Patient being discharged home.   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 CT A/P shows findings consistent with perianal fistula. Patient initiated on IV Zosyn  3.375 g every 8 hours. Continue adequate pain control with pain medication. Continue antiemetics as needed. GI, general surgery and ID is consulted. ID consult appreciated,  recommended to continue Zosyn .  Would hold Biologics in near future. GI recommended to start clear liquid diet,  IV pain medications and pain control. General surgery consult appreciated.  No plans for any surgical intervention needed. Continue IV hydration, pain control, bowel regimen. IR consulted for abscess drainage.  Site not amenable for drainage. Continue IV antibiotics. Patient discharged on Levaquin  and Flagyl  for 30 days.   Essential hypertension: Blood pressure is normotensive. Not taking any antihypertensive medications.    CKD stage IIIa: Serum creatinine at baseline.  Avoid nephrotoxic medications.   Chronic disease anemia: Monitor H&H.  No obvious visible bleeding.   Thrombocytosis : > Improved. likely in the setting of anemia and infection.   Continue to monitor platelet count.  Discharge Instructions  Discharge Instructions     Call MD for:  difficulty breathing, headache or visual disturbances   Complete by: As directed    Call MD for:  persistant dizziness or light-headedness   Complete by: As directed    Call MD for:  persistant nausea and vomiting   Complete by: As directed    Diet Carb Modified   Complete by: As directed    Discharge instructions   Complete by: As directed    Advised to follow-up with primary care physician in 1 week. Advised to follow-up with gastroenterology as scheduled. Advised to take Levaquin  750 mg daily and Flagyl  500 mg twice daily for 30 days. Advised to have CTA/P before discontinuation of antibiotics. Follow-up with ID in 2 to 4 weeks.   Increase activity slowly   Complete by: As directed       Allergies as of 09/21/2024       Reactions   Shrimp [shellfish Allergy] Shortness Of Breath   Nsaids Other (See Comments)   CROHN'S DISEASE = NO NSAIDS   Bactoshield Chg [chlorhexidine  Gluconate] Itching        Medication List     PAUSE taking these medications    Stelara  90 MG/ML Sosy injection Wait to take this until: October 02, 2024 Generic drug: ustekinumab  Inject 1 mL (90 mg total) into the skin every 28 (twenty-eight) days.       STOP taking these medications    Dulcolax 5 MG EC tablet Generic drug: bisacodyl    polyethylene glycol 17 g packet Commonly known as: MIRALAX  / GLYCOLAX    Vitamin D  (Ergocalciferol ) 1.25 MG (50000 UNIT) Caps capsule Commonly known as: DRISDOL        TAKE these medications    acetaminophen  500 MG tablet Commonly known as: TYLENOL  Take 2 tablets (1,000 mg total) by mouth every 8 (eight) hours as  needed for mild pain (pain score 1-3) or fever.   levofloxacin  750 MG tablet Commonly known as: LEVAQUIN  Take 1 tablet (750 mg total) by mouth daily. Start taking on: September 22, 2024   metroNIDAZOLE  500 MG tablet Commonly known as: FLAGYL  Take 1 tablet (500 mg total) by mouth every 12 (twelve) hours.   Oxycodone  HCl 10 MG Tabs Take 1 tablet (10 mg total) by mouth every 6 (six) hours as needed for up to 3 days for moderate pain (pain score 4-6) or breakthrough pain.   senna-docusate 8.6-50 MG tablet Commonly known as: Senokot-S Take 2 tablets by mouth 2 (two) times daily.        Follow-up Information     [name], MD Follow up.   Specialties: General Surgery, Colon and Rectal Surgery Contact information: [address] [place] [address] [phone]         Federico Rosario BROCKS, MD Follow up in 1 week(s).   Specialty: Gastroenterology Contact information: [address] [place] [address] [phone]                Allergies[1]  Consultations: Gastroenterology General Surgery Infectious diseases   Procedures/Studies: MR PELVIS W WO CONTRAST Result Date: 09/18/2024 EXAM: MRI OF THE PELVIS WITH AND WITHOUT INTRAVENOUS CONTRAST 09/18/2024 03:24:34 PM TECHNIQUE: Multiplanar magnetic resonance images of the pelvis with and without intravenous  contrast. 9 mL (gadobutrol  (GADAVIST ) 1 MMOL/ML injection 9 mL GADOBUTROL  1 MMOL/ML IV SOLN). COMPARISON: 03/11/2024. CLINICAL HISTORY: Crohn disease with perianal region fistula. FINDINGS: SOFT TISSUES: Mild perirectal stranding. Posterior perianal fistula at the 6 o'clock position originating from the lower anal canal (image 26 of series 10) and extending cephalad at the 6 o'clock position into the presacral space. Reduced size of the presacral abscess, currently measuring 5.6 x 2.6 x 0.8 cm (volume = 6 cm\S\3), previously measuring 6.7 x 1.1 x 4.5 cm (volume = 17 cm\S\3). Previously associated  extension through the sacrococcygeal foramina is improved although not completely resolved, with inflammatory stranding and enhancement extending along the lower sacral and sacrococcygeal foramina. There is also a small amount of abscess extension below the left piriformis muscle and deep to the left gluteus maximus muscle with this particular loculation measuring 2.6 x 1.7 x 3.1 cm (volume = 7.2 cm\S\3). A branch of the perianal fistula also tracks superiorly behind the rectum and attaches to the rectum at the 5 o'clock position (image 15 of series 6). Suspected seton posterior to the anal canal (image 31 series 2). PET 124. There is edema signal inferiorly in both piriformis muscles, throughout much of the medial left gluteus maximus, and to a lesser extent in the right medial gluteus maximus, compatible with chronic myositis. No overt inflammatory findings involving the sciatic nerves. Today's exam was not protocoled as a musculoskeletal protocol exam but rather utilized the anorectal fistula protocol. JOINTS: Unremarkable. No dislocation or significant effusion. LIMITED INTRAPELVIC CONTENTS: Mildly prominent perirectal lymph nodes measuring up to 0.7 cm in long axis on image 7 series 6, similar overall to prior. 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 No acute fracture. IMPRESSION: 1. Posterior perianal fistula at the 6 o'clock position extends cephalad with a branch communicating with the rectum at the 5 o'clock position, and a branch extending into a presacral abscess, which is currently reduced in size to 6 cm\S\3 (previously 17 cc). Improved but persistent extension through the sacrococcygeal foramina and additional abscess extension below the left piriformis and deep to the left gluteus maximus measuring 7.2 cm\S\3, with mild perirectal stranding and suspected seton posterior to the anal canal. 2. 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 3. 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 Electronically signed by: Ryan Salvage MD 09/18/2024 04:19 PM EST RP Workstation: [id number]   CT ABDOMEN PELVIS W CONTRAST Result Date: 09/18/2024 CLINICAL DATA:  Crohn's exacerbation. Concern for anal fistula. Symptoms for 24 hours. EXAM: CT ABDOMEN AND PELVIS WITH CONTRAST TECHNIQUE: Multidetector CT imaging of the abdomen and pelvis was performed using the standard protocol following bolus administration of intravenous contrast. RADIATION DOSE REDUCTION: This exam was performed according to the departmental dose-optimization program which includes automated exposure control, adjustment of the mA and/or kV according to patient size and/or use of iterative reconstruction technique. CONTRAST:  OMNIPAQUE  IOHEXOL  300 MG/ML  SOLN COMPARISON:  03/19/2024 CT and MRI. FINDINGS: Lower chest: Volume loss and subsegmental atelectasis in the dependent right lung. Normal heart size without pericardial or pleural effusion. Mild to moderate right hemidiaphragm elevation. Fluid or debris within the distal esophagus including on image 9/12. Hepatobiliary: Normal liver. Normal gallbladder, without biliary ductal dilatation. Pancreas: Normal, without mass or ductal dilatation. Spleen: Normal in size, without focal abnormality. Adrenals/Urinary Tract: Normal adrenal glands. Bilateral hypoattenuating renal lesions. Larger lesions, including up to 4.1 cm, most consistent with cysts  and minimally complex cysts. Other lesions are too small to characterize but most likely cysts. No follow-up indicated. No hydronephrosis.  Normal urinary bladder. Stomach/Bowel: Normal stomach, without wall thickening. Again identified is a midline seton. Mild asymmetric left rectal wall thickening including on image 83/3. Mildly improved since comparison CT. Trace presacral gas, decreased on image 75/3. The extent of ill-defined fluid and  edema within the midline and left ischioanal fossa is decreased. Persistent relatively similar small volume gas including on image 81/3. Normal terminal ileum and appendix. Normal small bowel. Vascular/Lymphatic: Normal aortic caliber. Perirectal adenopathy including at 1.0 cm on 74/3. Increased from 7 mm on the prior. Reproductive: Normal prostate. Other: No abdominopelvic fluid or extraluminal gas. Small fat containing right inguinal hernia. Musculoskeletal: No acute osseous abnormality. IMPRESSION: Persistent, improved asymmetric left rectal wall thickening, favoring proctitis. Improved presacral gas and ill-defined fluid/gas within the eccentric left ischioanal fossa. Findings likely are secondary to a perianal fistula, which is not entirely evaluated on CT. If further imaging characterization is desired, the test of choice is high-resolution pre and post-contrast pelvic MRI. No evidence of terminal ileal or more proximal colonic Crohn disease. Esophageal air fluid level suggests dysmotility or gastroesophageal reflux. Perirectal adenopathy, increased and favored to be reactive. Electronically Signed   By: Rockey Kilts M.D.   On: 09/18/2024 13:37   MRI pelvis  Subjective: Patient was seen and examined at bedside.  Overnight events noted. Patient reports feeling much improved , Pain is resolved and he wants to be discharged home.  Discharge Exam: Vitals:   09/20/24 2101 09/21/24 0618  BP: 126/87 126/86  Pulse: 67 (!) 58  Resp: 14 14  Temp: 98 F (36.7 C) 98.3 F (36.8 C)  SpO2: 100% 100%   Vitals:   09/20/24 0555 09/20/24 1253 09/20/24 2101 09/21/24 0618  BP: 116/77 (!) 139/90 126/87 126/86  Pulse: 76 80 67 (!) 58  Resp: 18 16 14 14   Temp: 98.6 F (37 C) 98 F (36.7 C) 98 F (36.7 C) 98.3 F (36.8 C)  TempSrc: Oral Oral Oral Oral  SpO2: 97% 99% 100% 100%  Weight:      Height:        General: Pt is alert, awake, not in acute distress Cardiovascular: RRR, S1/S2 +, no rubs, no  gallops Respiratory: CTA bilaterally, no wheezing, no rhonchi Abdominal: Soft, NT, ND, bowel sounds + Extremities: no edema, no cyanosis    The results of significant diagnostics from this hospitalization (including imaging, microbiology, ancillary and laboratory) are listed below for reference.     Microbiology: Recent Results (from the past 240 hours)  Culture, blood (routine x 2)     Status: None (Preliminary result)   Collection Time: 09/18/24 10:10 AM   Specimen: BLOOD  Result Value Ref Range Status   Specimen Description   Final    BLOOD LEFT ANTECUBITAL Performed at [facility], [address] [place], [place] [address]    Special Requests   Final    BOTTLES DRAWN AEROBIC AND ANAEROBIC Blood Culture adequate volume Performed at [facility], [address], [place], [place] [address]    Culture   Final    NO GROWTH 3 DAYS Performed at [facility], [address], [place], [place] [address]    Report Status PENDING  Incomplete  Culture, blood (routine x 2)     Status: None (Preliminary result)   Collection Time: 09/18/24 10:20 AM   Specimen: BLOOD  Result  Value Ref Range Status   Specimen Description   Final    BLOOD RIGHT ANTECUBITAL Performed at [facility], [address] [place], [place] [address]    Special Requests   Final    BOTTLES DRAWN AEROBIC AND ANAEROBIC Blood Culture adequate volume Performed at [facility], [address], [place], [place] [address]    Culture   Final    NO GROWTH 3 DAYS Performed at [facility], [address], [place], [place] [address]    Report Status PENDING  Incomplete     Labs: BNP (last 3 results) No results for input(s): BNP in the last 8760 hours. Basic Metabolic Panel: Recent Labs  Lab 09/18/24 0955 09/19/24 0625 09/20/24 0552 09/21/24 1040  NA 136 139 140 139  K 3.7 4.2 4.2 3.9  CL 103 105 107 107  CO2 23 23 24  22   GLUCOSE 102* 87 96 122*  BUN 19 14 14 14   CREATININE 1.53* 1.78* 1.62* 1.52*  CALCIUM  8.7* 8.5* 8.5* 9.0  MG  --   --  2.4  --   PHOS  --   --  3.1  --    Liver Function Tests: Recent Labs  Lab 09/18/24 0955 09/19/24 0625  AST 18 26  ALT 13 24  ALKPHOS 134* 116  BILITOT 0.4 0.6  PROT 7.8 7.0  ALBUMIN  3.5 3.2*   No results for input(s): LIPASE, AMYLASE in the last 168 hours. No results for input(s): AMMONIA in the last 168 hours. CBC: Recent Labs  Lab 09/18/24 0955 09/19/24 0625 09/20/24 0552 09/21/24 0735  WBC 15.2* 10.6* 7.6 6.6  NEUTROABS 10.6*  --   --   --   HGB 11.9* 11.7* 11.2* 13.2  HCT 35.8* 35.3* 34.4* 40.5  MCV 80.4 80.4 82.1 83.2  PLT 301 288 284 316   Cardiac Enzymes: No results for input(s): CKTOTAL, CKMB, CKMBINDEX, TROPONINI in the last 168 hours. BNP: Invalid input(s): POCBNP CBG: No results for input(s): GLUCAP in the last 168 hours. D-Dimer No results for input(s): DDIMER in the last 72 hours. Hgb A1c No results for input(s): HGBA1C in the last 72 hours. Lipid Profile No results for input(s): CHOL, HDL, LDLCALC, TRIG, CHOLHDL, LDLDIRECT in the last 72 hours. Thyroid  function studies No results for input(s): TSH, T4TOTAL, T3FREE, THYROIDAB in the last 72 hours.  Invalid input(s): FREET3 Anemia work up No results for input(s): VITAMINB12, FOLATE, FERRITIN, TIBC, IRON , RETICCTPCT in the last 72 hours. Urinalysis    Component Value Date/Time   COLORURINE YELLOW 10/25/2023 2008   APPEARANCEUR CLEAR 10/25/2023 2008   LABSPEC 1.021 10/25/2023 2008   PHURINE 5.0 10/25/2023 2008   GLUCOSEU NEGATIVE 10/25/2023 2008   HGBUR NEGATIVE 10/25/2023 2008   BILIRUBINUR NEGATIVE 10/25/2023 2008   KETONESUR NEGATIVE 10/25/2023 2008   PROTEINUR 30 (A) 10/25/2023 2008   UROBILINOGEN 1.0 01/10/2014 2044   NITRITE NEGATIVE 10/25/2023 2008   LEUKOCYTESUR NEGATIVE 10/25/2023 2008   Sepsis  Labs Recent Labs  Lab 09/18/24 0955 09/19/24 0625 09/20/24 0552 09/21/24 0735  WBC 15.2* 10.6* 7.6 6.6   Microbiology Recent Results (from the past 240 hours)  Culture, blood (routine x 2)     Status: None (Preliminary result)   Collection Time: 09/18/24 10:10 AM   Specimen: BLOOD  Result Value Ref Range Status   Specimen Description   Final    BLOOD LEFT ANTECUBITAL Performed at [facility], [address], [place], [place] [address]  Special Requests   Final    BOTTLES DRAWN AEROBIC AND ANAEROBIC Blood Culture adequate volume Performed at [facility], [address], [place], [place] [address]    Culture   Final    NO GROWTH 3 DAYS Performed at [facility], [address] [place], [place] [address]    Report Status PENDING  Incomplete  Culture, blood (routine x 2)     Status: None (Preliminary result)   Collection Time: 09/18/24 10:20 AM   Specimen: BLOOD  Result Value Ref Range Status   Specimen Description   Final    BLOOD RIGHT ANTECUBITAL Performed at [facility], [address] [place], [place] [address]    Special Requests   Final    BOTTLES DRAWN AEROBIC AND ANAEROBIC Blood Culture adequate volume Performed at [facility], [address] [place], [place] [address]    Culture   Final    NO GROWTH 3 DAYS Performed at [facility], [address], [place], [place] [address]    Report Status PENDING  Incomplete     Time coordinating discharge: Over 30 minutes  SIGNED:   Darcel Dawley, MD  [facility] 09/21/2024, 12:50 PM Pager   If 7PM-7AM, please contact night-coverage       [1]  Allergies Allergen Reactions   Shrimp [Shellfish Allergy] Shortness Of Breath   Nsaids Other (See Comments)    CROHN'S DISEASE = NO NSAIDS   Bactoshield Chg [Chlorhexidine  Gluconate] Itching

## 2024-09-21 NOTE — Progress Notes (Addendum)
 "  Progress Note     Subjective: No acute events overnight. Pain still present, rated 7/10, but improved compared to day of admission. +flatus. No BM yet. Tolerating liquids and has ordered a solid diet for breakfast.  ROS  All negative with the exception of above.  Objective: Vital signs in last 24 hours: Temp:  [98 F (36.7 C)-98.3 F (36.8 C)] 98.3 F (36.8 C) (01/30 0618) Pulse Rate:  [58-80] 58 (01/30 0618) Resp:  [14-16] 14 (01/30 0618) BP: (126-139)/(86-90) 126/86 (01/30 0618) SpO2:  [99 %-100 %] 100 % (01/30 0618) Last BM Date : 09/16/24  Intake/Output from previous day: 01/29 0701 - 01/30 0700 In: 296.9 [P.O.:240; IV Piggyback:56.9] Out: -  Intake/Output this shift: No intake/output data recorded.  PE: General: Pleasant male who is sitting EOB in NAD HEENT: Head is normocephalic, atraumatic.  Heart: RRR, no lower extremity edema  Lungs: Respiratory effort nonlabored on room air.  Psych: A&Ox3 with an appropriate affect. Rectal exam: non-tender in perianal region. No fissures noted. Fistula noted posterior to anal area. Seton present. Scant purulence present. No masses palpated. No active bleeding noted. No cellulitis or induration.  Lab Results:  Recent Labs    09/19/24 0625 09/20/24 0552  WBC 10.6* 7.6  HGB 11.7* 11.2*  HCT 35.3* 34.4*  PLT 288 284   BMET Recent Labs    09/19/24 0625 09/20/24 0552  NA 139 140  K 4.2 4.2  CL 105 107  CO2 23 24  GLUCOSE 87 96  BUN 14 14  CREATININE 1.78* 1.62*  CALCIUM  8.5* 8.5*   PT/INR No results for input(s): LABPROT, INR in the last 72 hours. CMP     Component Value Date/Time   NA 140 09/20/2024 0552   NA 141 06/05/2024 1532   K 4.2 09/20/2024 0552   CL 107 09/20/2024 0552   CO2 24 09/20/2024 0552   GLUCOSE 96 09/20/2024 0552   BUN 14 09/20/2024 0552   BUN 15 06/05/2024 1532   CREATININE 1.62 (H) 09/20/2024 0552   CREATININE 1.40 (H) 08/20/2024 0845   CALCIUM  8.5 (L) 09/20/2024 0552   PROT  7.0 09/19/2024 0625   PROT 7.1 06/05/2024 1532   ALBUMIN  3.2 (L) 09/19/2024 0625   ALBUMIN  3.8 (L) 06/05/2024 1532   AST 26 09/19/2024 0625   AST 21 08/20/2024 0845   ALT 24 09/19/2024 0625   ALT 20 08/20/2024 0845   ALKPHOS 116 09/19/2024 0625   BILITOT 0.6 09/19/2024 0625   BILITOT 0.4 08/20/2024 0845   GFRNONAA 55 (L) 09/20/2024 0552   GFRNONAA >60 08/20/2024 0845   GFRAA >60 01/08/2020 1830   Lipase     Component Value Date/Time   LIPASE 62 (H) 10/25/2023 1206       Studies/Results: No results found.   Anti-infectives: Anti-infectives (From admission, onward)    Start     Dose/Rate Route Frequency Ordered Stop   09/20/24 2200  metroNIDAZOLE  (FLAGYL ) tablet 500 mg        500 mg Oral Every 12 hours 09/20/24 1611     09/20/24 1700  levofloxacin  (LEVAQUIN ) tablet 750 mg        750 mg Oral Daily 09/20/24 1611     09/18/24 1830  piperacillin -tazobactam (ZOSYN ) IVPB 3.375 g  Status:  Discontinued        3.375 g 12.5 mL/hr over 240 Minutes Intravenous Every 8 hours 09/18/24 1610 09/20/24 1611   09/18/24 0945  piperacillin -tazobactam (ZOSYN ) IVPB 3.375 g  3.375 g 100 mL/hr over 30 Minutes Intravenous  Once 09/18/24 0944 09/18/24 1103        Assessment/Plan -39 year old male with history of perianal fistulizing Crohn's disease with recurrent perianal abscesses that presented for rectal pain and drainage. Imaging concerning for proctitis and fluid/abscess. Hx of osteomyelitis.  -s/p EUA with internal drainage of left posterior perirectal abscess 03/2021, s/p modified hanley procedure drainage of high anal fistula with seton placement, coccygectomy 06/2021, and s/p transrectal drainage replacement of seton excision of perianeal sinus tract exam under anesthesia 12/2021.   -Currently afebrile, leukocytosis resolved. - pain improving, no cellulitis  - No acute surgical needs, abscess seems to be slowly decompressing via current seton -Continue with abx per ID. Med mgmt  of Crohn's per GI. - Soft diet - No acute surgical needs - we will sign off. Will arrange outpatient follow up.   FEN:  Soft VTE: SCDs ID: [email or web address]     LOS: 3 days   I reviewed consulting provider notes, specialist notes, hospitalist notes, nursing notes, last 24 h vitals and pain scores, last 48 h intake and output, last 24 h labs and trends, and last 24 h imaging results.  This care required moderate level of medical decision making.   Almarie GORMAN Pringle, [facility] Surgery 09/21/2024, 8:31 AM Please see Amion for pager number during day hours 7:00am-4:30pm  "

## 2024-09-23 LAB — CULTURE, BLOOD (ROUTINE X 2)
Culture: NO GROWTH
Culture: NO GROWTH
Special Requests: ADEQUATE
Special Requests: ADEQUATE

## 2024-09-24 ENCOUNTER — Telehealth: Payer: Self-pay

## 2024-09-24 NOTE — Transitions of Care (Post Inpatient/ED Visit) (Signed)
" ° °  09/24/2024  Name: Shawn [name] MRN: [id number] DOB: 09/10/1985  Today's TOC FU Call Status: Today's TOC FU Call Status:: Unsuccessful Call (1st Attempt) Unsuccessful Call (1st Attempt) Date: 09/24/24  Attempted to reach the patient regarding the most recent Inpatient/ED visit.  Follow Up Plan: Additional outreach attempts will be made to reach the patient to complete the Transitions of Care (Post Inpatient/ED visit) call.   Arvin Seip RN, BSN, CCM [employer], Population Health Case Manager Phone: [phone]  "

## 2024-09-27 ENCOUNTER — Encounter (HOSPITAL_BASED_OUTPATIENT_CLINIC_OR_DEPARTMENT_OTHER): Payer: Self-pay

## 2024-09-28 ENCOUNTER — Encounter (HOSPITAL_BASED_OUTPATIENT_CLINIC_OR_DEPARTMENT_OTHER): Payer: Self-pay

## 2024-09-28 ENCOUNTER — Ambulatory Visit (HOSPITAL_BASED_OUTPATIENT_CLINIC_OR_DEPARTMENT_OTHER)

## 2024-09-28 VITALS — BP 124/88 | HR 83 | Ht 73.0 in | Wt 198.0 lb

## 2024-09-28 DIAGNOSIS — K50113 Crohn's disease of large intestine with fistula: Secondary | ICD-10-CM

## 2024-09-28 DIAGNOSIS — K604 Rectal fistula, unspecified: Secondary | ICD-10-CM

## 2024-09-28 DIAGNOSIS — M86651 Other chronic osteomyelitis, right thigh: Secondary | ICD-10-CM

## 2024-09-28 DIAGNOSIS — G894 Chronic pain syndrome: Secondary | ICD-10-CM

## 2024-09-28 LAB — BASIC METABOLIC PANEL WITH GFR
BUN/Creatinine Ratio: 11 (ref 9–20)
BUN: 16 mg/dL (ref 6–24)
CO2: 19 mmol/L — ABNORMAL LOW (ref 20–29)
Calcium: 9 mg/dL (ref 8.7–10.2)
Chloride: 108 mmol/L — ABNORMAL HIGH (ref 96–106)
Creatinine, Ser: 1.49 mg/dL — ABNORMAL HIGH (ref 0.76–1.27)
Glucose: 78 mg/dL (ref 70–99)
Potassium: 5.1 mmol/L (ref 3.5–5.2)
Sodium: 144 mmol/L (ref 134–144)
eGFR: 60 mL/min/{1.73_m2}

## 2024-09-28 LAB — CBC
Hematocrit: 42.6 % (ref 37.5–51.0)
Hemoglobin: 13.3 g/dL (ref 13.0–17.7)
MCH: 26.9 pg (ref 26.6–33.0)
MCHC: 31.2 g/dL — ABNORMAL LOW (ref 31.5–35.7)
MCV: 86 fL (ref 79–97)
Platelets: 443 10*3/uL (ref 150–450)
RBC: 4.95 x10E6/uL (ref 4.14–5.80)
RDW: 16.7 % — ABNORMAL HIGH (ref 11.6–15.4)
WBC: 11.2 10*3/uL — ABNORMAL HIGH (ref 3.4–10.8)
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
## 2024-09-28 NOTE — Patient Instructions (Signed)
 I strongly suggest you restart your antibiotics as it is imperative you complete this full course.   I am unable to prescribe pain medication for you today.  I have ordered your follow up lab work per your hospital discharge instructions.  Please follow up with GI on 2/11 as scheduled.  I have attempted to place another referral for your for pain management. Please make sure to go to your appointments if they are able to get you in.

## 2024-10-03 ENCOUNTER — Ambulatory Visit: Admitting: Pediatrics

## 2024-10-08 ENCOUNTER — Ambulatory Visit (HOSPITAL_BASED_OUTPATIENT_CLINIC_OR_DEPARTMENT_OTHER): Admitting: Family Medicine

## 2024-10-09 ENCOUNTER — Ambulatory Visit: Payer: Self-pay | Admitting: Internal Medicine

## 2024-10-19 ENCOUNTER — Inpatient Hospital Stay: Attending: Nurse Practitioner

## 2024-11-16 ENCOUNTER — Inpatient Hospital Stay: Attending: Nurse Practitioner

## 2024-11-16 ENCOUNTER — Inpatient Hospital Stay

## 2024-12-20 ENCOUNTER — Inpatient Hospital Stay: Attending: Nurse Practitioner

## 2025-01-18 ENCOUNTER — Inpatient Hospital Stay: Attending: Nurse Practitioner

## 2025-02-19 ENCOUNTER — Inpatient Hospital Stay

## 2025-02-19 ENCOUNTER — Inpatient Hospital Stay: Attending: Nurse Practitioner | Admitting: Nurse Practitioner
